# Patient Record
Sex: Female | Born: 1935 | Race: White | Hispanic: No | Marital: Married | Smoking: Never smoker
Health system: Southern US, Community
[De-identification: ages and names within clinical notes are randomized; demographics above are authoritative.]

## PROBLEM LIST (undated history)

## (undated) ENCOUNTER — Emergency Department (HOSPITAL_COMMUNITY): Payer: Medicare Other

## (undated) DIAGNOSIS — R1319 Other dysphagia: Secondary | ICD-10-CM

## (undated) DIAGNOSIS — R0602 Shortness of breath: Secondary | ICD-10-CM

## (undated) DIAGNOSIS — F419 Anxiety disorder, unspecified: Secondary | ICD-10-CM

## (undated) DIAGNOSIS — R0989 Other specified symptoms and signs involving the circulatory and respiratory systems: Secondary | ICD-10-CM

## (undated) DIAGNOSIS — R079 Chest pain, unspecified: Secondary | ICD-10-CM

## (undated) DIAGNOSIS — G4733 Obstructive sleep apnea (adult) (pediatric): Secondary | ICD-10-CM

## (undated) DIAGNOSIS — E785 Hyperlipidemia, unspecified: Secondary | ICD-10-CM

## (undated) DIAGNOSIS — R3989 Other symptoms and signs involving the genitourinary system: Secondary | ICD-10-CM

## (undated) DIAGNOSIS — Z889 Allergy status to unspecified drugs, medicaments and biological substances status: Secondary | ICD-10-CM

## (undated) DIAGNOSIS — Q794 Prune belly syndrome: Secondary | ICD-10-CM

## (undated) DIAGNOSIS — M791 Myalgia, unspecified site: Secondary | ICD-10-CM

## (undated) DIAGNOSIS — E538 Deficiency of other specified B group vitamins: Secondary | ICD-10-CM

## (undated) DIAGNOSIS — I38 Endocarditis, valve unspecified: Secondary | ICD-10-CM

## (undated) DIAGNOSIS — R42 Dizziness and giddiness: Secondary | ICD-10-CM

## (undated) DIAGNOSIS — E559 Vitamin D deficiency, unspecified: Secondary | ICD-10-CM

## (undated) DIAGNOSIS — N926 Irregular menstruation, unspecified: Secondary | ICD-10-CM

## (undated) DIAGNOSIS — K449 Diaphragmatic hernia without obstruction or gangrene: Secondary | ICD-10-CM

## (undated) DIAGNOSIS — I71012 Dissection of descending thoracic aorta: Secondary | ICD-10-CM

## (undated) DIAGNOSIS — R0600 Dyspnea, unspecified: Secondary | ICD-10-CM

## (undated) DIAGNOSIS — IMO0001 Reserved for inherently not codable concepts without codable children: Secondary | ICD-10-CM

## (undated) DIAGNOSIS — I10 Essential (primary) hypertension: Secondary | ICD-10-CM

## (undated) DIAGNOSIS — E039 Hypothyroidism, unspecified: Secondary | ICD-10-CM

## (undated) DIAGNOSIS — K5790 Diverticulosis of intestine, part unspecified, without perforation or abscess without bleeding: Secondary | ICD-10-CM

## (undated) DIAGNOSIS — R5383 Other fatigue: Secondary | ICD-10-CM

## (undated) DIAGNOSIS — R809 Proteinuria, unspecified: Secondary | ICD-10-CM

## (undated) DIAGNOSIS — M1990 Unspecified osteoarthritis, unspecified site: Secondary | ICD-10-CM

## (undated) DIAGNOSIS — R51 Headache: Secondary | ICD-10-CM

## (undated) DIAGNOSIS — K219 Gastro-esophageal reflux disease without esophagitis: Secondary | ICD-10-CM

## (undated) DIAGNOSIS — G43909 Migraine, unspecified, not intractable, without status migrainosus: Secondary | ICD-10-CM

## (undated) DIAGNOSIS — K8689 Other specified diseases of pancreas: Secondary | ICD-10-CM

## (undated) DIAGNOSIS — I219 Acute myocardial infarction, unspecified: Secondary | ICD-10-CM

## (undated) DIAGNOSIS — M79669 Pain in unspecified lower leg: Secondary | ICD-10-CM

## (undated) DIAGNOSIS — E079 Disorder of thyroid, unspecified: Secondary | ICD-10-CM

## (undated) HISTORY — PX: CYST REMOVAL NECK: SHX6281

## (undated) HISTORY — DX: Pain in unspecified lower leg: M79.669

## (undated) HISTORY — DX: Anxiety disorder, unspecified: F41.9

## (undated) HISTORY — DX: Obstructive sleep apnea (adult) (pediatric): G47.33

## (undated) HISTORY — DX: Essential (primary) hypertension: I10

## (undated) HISTORY — DX: Hyperlipidemia, unspecified: E78.5

## (undated) HISTORY — DX: Dizziness and giddiness: R42

## (undated) HISTORY — DX: Endocarditis, valve unspecified: I38

## (undated) HISTORY — DX: Other dysphagia: R13.19

## (undated) HISTORY — DX: Myalgia, unspecified site: M79.10

## (undated) HISTORY — DX: Migraine, unspecified, not intractable, without status migrainosus: G43.909

## (undated) HISTORY — PX: TONSILLECTOMY: SUR1361

## (undated) HISTORY — DX: Other symptoms and signs involving the genitourinary system: R39.89

## (undated) HISTORY — DX: Acute myocardial infarction, unspecified: I21.9

## (undated) HISTORY — DX: Gastro-esophageal reflux disease without esophagitis: K21.9

## (undated) HISTORY — DX: Shortness of breath: R06.02

## (undated) HISTORY — DX: Disorder of thyroid, unspecified: E07.9

## (undated) HISTORY — DX: Unspecified osteoarthritis, unspecified site: M19.90

## (undated) HISTORY — DX: Diaphragmatic hernia without obstruction or gangrene: K44.9

## (undated) HISTORY — DX: Other specified diseases of pancreas: K86.89

## (undated) HISTORY — DX: Diverticulosis of intestine, part unspecified, without perforation or abscess without bleeding: K57.90

## (undated) HISTORY — DX: Dyspnea, unspecified: R06.00

## (undated) HISTORY — DX: Proteinuria, unspecified: R80.9

## (undated) HISTORY — DX: Hypothyroidism, unspecified: E03.9

## (undated) HISTORY — DX: Irregular menstruation, unspecified: N92.6

## (undated) HISTORY — DX: Vitamin D deficiency, unspecified: E55.9

## (undated) HISTORY — DX: Other specified symptoms and signs involving the circulatory and respiratory systems: R09.89

## (undated) HISTORY — DX: Deficiency of other specified B group vitamins: E53.8

## (undated) HISTORY — DX: Chest pain, unspecified: R07.9

## (undated) HISTORY — DX: Prune belly syndrome: Q79.4

## (undated) HISTORY — DX: Reserved for inherently not codable concepts without codable children: IMO0001

## (undated) HISTORY — DX: Dissection of descending thoracic aorta: I71.012

## (undated) HISTORY — DX: Other fatigue: R53.83

## (undated) HISTORY — DX: Allergy status to unspecified drugs, medicaments and biological substances: Z88.9

## (undated) HISTORY — DX: Headache: R51

---

## 1972-02-21 HISTORY — PX: CYSTECTOMY: SUR359

## 1974-02-20 HISTORY — PX: ABDOMINAL HYSTERECTOMY: SHX81

## 1994-02-20 HISTORY — PX: CYSTECTOMY: SUR359

## 1998-02-20 HISTORY — PX: CHOLECYSTECTOMY: SHX55

## 1998-06-07 ENCOUNTER — Emergency Department (HOSPITAL_COMMUNITY): Admission: EM | Admit: 1998-06-07 | Discharge: 1998-06-07 | Payer: Self-pay | Admitting: Emergency Medicine

## 1998-06-07 ENCOUNTER — Encounter: Payer: Self-pay | Admitting: Emergency Medicine

## 1998-06-30 ENCOUNTER — Ambulatory Visit (HOSPITAL_COMMUNITY): Admission: RE | Admit: 1998-06-30 | Discharge: 1998-06-30 | Payer: Self-pay | Admitting: Neurology

## 1999-06-09 ENCOUNTER — Encounter: Admission: RE | Admit: 1999-06-09 | Discharge: 1999-06-09 | Payer: Self-pay | Admitting: Endocrinology

## 1999-06-09 ENCOUNTER — Encounter: Payer: Self-pay | Admitting: Endocrinology

## 1999-06-23 ENCOUNTER — Ambulatory Visit (HOSPITAL_COMMUNITY): Admission: RE | Admit: 1999-06-23 | Discharge: 1999-06-23 | Payer: Self-pay | Admitting: Neurology

## 1999-06-23 ENCOUNTER — Encounter: Payer: Self-pay | Admitting: Neurology

## 2000-06-11 ENCOUNTER — Encounter: Admission: RE | Admit: 2000-06-11 | Discharge: 2000-06-11 | Payer: Self-pay | Admitting: Endocrinology

## 2000-06-11 ENCOUNTER — Encounter: Payer: Self-pay | Admitting: Endocrinology

## 2000-11-19 ENCOUNTER — Encounter: Payer: Self-pay | Admitting: Endocrinology

## 2000-11-19 ENCOUNTER — Encounter: Admission: RE | Admit: 2000-11-19 | Discharge: 2000-11-19 | Payer: Self-pay | Admitting: Endocrinology

## 2001-02-20 HISTORY — PX: EYE SURGERY: SHX253

## 2001-05-13 ENCOUNTER — Other Ambulatory Visit: Admission: RE | Admit: 2001-05-13 | Discharge: 2001-05-13 | Payer: Self-pay | Admitting: Obstetrics & Gynecology

## 2001-06-18 ENCOUNTER — Encounter: Admission: RE | Admit: 2001-06-18 | Discharge: 2001-06-18 | Payer: Self-pay | Admitting: Endocrinology

## 2001-06-18 ENCOUNTER — Encounter: Payer: Self-pay | Admitting: Endocrinology

## 2002-01-10 ENCOUNTER — Encounter: Payer: Self-pay | Admitting: Urology

## 2002-01-10 ENCOUNTER — Encounter: Admission: RE | Admit: 2002-01-10 | Discharge: 2002-01-10 | Payer: Self-pay | Admitting: Urology

## 2002-04-25 ENCOUNTER — Encounter: Payer: Self-pay | Admitting: Endocrinology

## 2002-04-25 ENCOUNTER — Encounter: Admission: RE | Admit: 2002-04-25 | Discharge: 2002-04-25 | Payer: Self-pay | Admitting: Endocrinology

## 2002-06-20 ENCOUNTER — Encounter: Admission: RE | Admit: 2002-06-20 | Discharge: 2002-06-20 | Payer: Self-pay | Admitting: Endocrinology

## 2002-06-20 ENCOUNTER — Encounter: Payer: Self-pay | Admitting: Endocrinology

## 2002-08-27 ENCOUNTER — Other Ambulatory Visit: Admission: RE | Admit: 2002-08-27 | Discharge: 2002-08-27 | Payer: Self-pay | Admitting: Obstetrics & Gynecology

## 2002-10-15 ENCOUNTER — Inpatient Hospital Stay (HOSPITAL_COMMUNITY): Admission: AD | Admit: 2002-10-15 | Discharge: 2002-10-17 | Payer: Self-pay | Admitting: Obstetrics & Gynecology

## 2003-05-25 ENCOUNTER — Ambulatory Visit (HOSPITAL_COMMUNITY): Admission: RE | Admit: 2003-05-25 | Discharge: 2003-05-25 | Payer: Self-pay | Admitting: Endocrinology

## 2004-11-25 ENCOUNTER — Encounter: Admission: RE | Admit: 2004-11-25 | Discharge: 2004-11-25 | Payer: Self-pay | Admitting: Endocrinology

## 2005-11-27 ENCOUNTER — Encounter: Admission: RE | Admit: 2005-11-27 | Discharge: 2005-11-27 | Payer: Self-pay | Admitting: Endocrinology

## 2006-11-30 ENCOUNTER — Encounter: Admission: RE | Admit: 2006-11-30 | Discharge: 2006-11-30 | Payer: Self-pay | Admitting: Endocrinology

## 2007-12-27 ENCOUNTER — Encounter: Admission: RE | Admit: 2007-12-27 | Discharge: 2007-12-27 | Payer: Self-pay

## 2008-12-31 ENCOUNTER — Encounter: Admission: RE | Admit: 2008-12-31 | Discharge: 2008-12-31 | Payer: Self-pay | Admitting: Medical Oncology

## 2009-02-20 DIAGNOSIS — R079 Chest pain, unspecified: Secondary | ICD-10-CM

## 2009-02-20 HISTORY — DX: Chest pain, unspecified: R07.9

## 2009-11-19 ENCOUNTER — Ambulatory Visit: Payer: Self-pay | Admitting: Cardiovascular Disease

## 2009-12-02 ENCOUNTER — Ambulatory Visit: Payer: Self-pay | Admitting: Cardiovascular Disease

## 2010-07-05 NOTE — Letter (Signed)
November 19, 2009    [facility]  [address]  [place], [place] [address]   RE:  Leah Pittman, Leah Pittman  MRN:  [id number]  /  DOB:  [birth year]   Dear Dr. Orvan Falconer,   I had the pleasure of seeing Ms. Leah Pittman for cardiac evaluation.  The patient had scheduled an appointment to see me regarding recent  chest pain, fatigue and issues with her blood pressure.  She has the  following problem list:  1. Hypertension.  2. Hypothyroidism.  3. Anxiety.  4. Glaucoma.  5. Myalgia.  6. Migraine headache.   The patient is here today with multiple complaints.  She has been having  significant problems with headache as well as discomfort in the back of  her head and neck.  She is following up with Neurology in that regard.  She also has been having vision issues with glaucoma.  She has been  complaining of generalized fatigue and having no energy.  She also has  been having some left-sided chest discomfort which is localized in 1  area.  It happens at rest, and according to the patient, it gets  slightly worse with activities.  She has chronic exertional dyspnea.  She has no previous cardiac history.  It seems that she has been  frustrated with her overall medical conditions.  According to the  patient, her blood pressure is very labile and recently was found to be  very elevated.  She has been on [place] and then Bystolic was added.  Her  blood pressure dropped to a systolic of 90s when she started taking  Bystolic, and thus, she stopped taking the medication.  She has not  taken it in more than a week.   PAST MEDICAL HISTORY:  As outlined above.   MEDICATIONS:  1. Bystolic 5 mg once daily.  2. Depakote 250 mg at bedtime.  This is an extended release.  3. Vitamin D 50,000 international units once a week.  4. Eye drops.  5. Synthroid 75 mcg once daily.  6. Edarbi 80 mg once daily.  7. Probiotics once daily.   ALLERGIES:  Codeine and Phenergan.   SOCIAL  HISTORY:  Negative for smoking, alcohol or drug use.  The patient  is married and is retired.   FAMILY HISTORY:  Negative for premature coronary artery disease.   REVIEW OF SYSTEMS:  Remarkable for allergies, generalized fatigue,  constipation, chest pain, dyspnea, arthritis pain, anxiety, and  headache.  Full review of system was performed and is otherwise  negative.   PHYSICAL EXAMINATION:  GENERAL:  The patient seems to be anxious.  She  appears to be in no acute distress.  VITAL SIGNS:  Weight is 131.6 pounds, blood pressure is 134/68, pulse is  65, oxygen saturation is 98% on room air.  HEENT:  Normocephalic, atraumatic.  NECK:  No JVD or carotid bruits.  RESPIRATORY:  Normal respiratory effort with no use of accessory  muscles.  Auscultation reveals normal breath sounds.  CARDIOVASCULAR:  Normal PMI.  Normal S1-S2 with no gallops or murmurs.  ABDOMEN:  Benign, nontender, nondistended.  EXTREMITIES:  With no clubbing, cyanosis or edema.  Skin is warm and dry  with no rash.  PSYCHIATRIC:  She is alert, oriented x3.  She appears to be anxious.  MUSCULOSKELETAL:  There is normal muscle strength in the upper and lower  extremities.   STUDIES:  An electrocardiogram was performed which showed sinus rhythm  with nonspecific  T-wave changes.   IMPRESSION:  1. Atypical chest pain.  Unfortunately, the patient has multiple      complaints and seems to have generalized pain manifestations.  She      does have chest pain which is difficult to determine if it is      cardiac or noncardiac.  From the description, it sounds to be      musculoskeletal.  Due to her age and risk factors, I recommend      evaluation with a stress test for further evaluation.  I am afraid      she will not be able to get her heart rate up enough due to her      generalized fatigue, and thus, I suggested a pharmacological stress      test.  However, she is very worried about side effects due to her      previous  experience with adenosine.  Will attempt treadmill type,      but it might have to be switched if she is not able to achieve the      desired maximal heart rate.  2. Hypertension which seems to be labile.  She has not been taking      Bystolic for a week.  I asked her to the record her blood pressure      at least three times a week. Will review when she comes back for      follow-up.  Will decide at that time if resuming Bystolic will be      needed.  I explained to her that her blood pressure can fluctuate      depending on her overall general status, degree of discomfort and      stress that she is having from her headache.  3. Borderline hyperlipidemia.  Her recent lipid profile showed a total      cholesterol of 223, HDL 59, triglyceride 204 and LDL of 123.  At      this degree, I recommend lifestyle changes before trying any      medications.  The patient will follow-up after her stress test and      we will reevaluate her blood pressure at that time.    Sincerely,      Lorine Bears, MD  Electronically Signed    MA/MedQ  DD: 11/19/2009  DT: 11/19/2009  Job #: [id number]

## 2010-07-05 NOTE — Assessment & Plan Note (Signed)
[facility]                        [place] CARDIOLOGY OFFICE NOTE   NAME:Beevers, CARRERA KIESEL                      MRN:          [id number]  DATE:12/02/2009                            DOB:          11/26/1930    Ms. Leah Pittman is a 77 year old female who is here today for a followup  visit.  She has the following problem list:  1. Labile hypertension.  2. Hypothyroidism.  3. Anxiety.  4. Glaucoma.  5. Myalgia.  6. Migraine headache.   I saw Ms. Shidler recently for evaluation of labile hypertension as  well as atypical chest pain.  She was having significant issues with her  glaucoma as well and was having severe headache.  Since that time, she  was started on Depakote and there has been significant improvement in  her symptoms.  She does not report any more chest pain except for aching  on the left side which is tender to touch.  She has no exertional chest  tightness.  She underwent a stress test and overall did well.  She did  not have any chest pain during the stress test.  The patient has not  been taking any blood pressure medications and has been monitoring her  blood pressure at home.  She has not had any systolic blood pressure  reading above 140, in spite of being off Bystolic and Edarbi.  It seems  that her blood pressure goes up significantly when she is having a  headache or if she is under stress.   PHYSICAL EXAMINATION:  VITAL SIGNS:  Weight is 134 pounds, blood  pressure is 139/75, pulse is 76, oxygen saturation is 97% on room air.  NECK:  No JVD or carotid bruits.  LUNGS:  Clear to auscultation.  HEART:  Regular rate and rhythm with no gallops or murmurs.  ABDOMEN:  Benign, nontender, nondistended.  EXTREMITIES:  With no clubbing, cyanosis, or edema.   IMPRESSION:  1. Atypical chest pain which seems to be musculoskeletal in nature.      We did a nuclear stress test due to her risk factors.  Overall, she      had no chest pain and  no EKG changes with exercise.  She was able      to exercise for 3 minutes and 30 seconds and achieved an 85% of      maximal predicted heart rate.  Overall, stress test was normal with      no evidence of ischemia or infarct.  The patient was reassured      about the results.  2. Labile hypertension.  This seems to be related to anxiety and      stress.  She has been off her 2 blood pressure medications.  The      readings have been overall in a good range except for 1 or 2      readings.  At this time, I recommend that she continues to record      her blood pressure readings.  If her systolic pressure continues to      go above 140 and it might  be reasonable to resume Bystolic 5 mg      once daily alone.  A beta-blocker might be helpful in her situation      given that most of her hypertension seems to be driven by anxiety      and likely      hyperadrenergic state.  No further cardiac follow up is needed.      She can follow up on an as-needed basis.     Lorine Bears, MD  Electronically Signed    MA/MedQ  DD: 12/02/2009  DT: 12/03/2009  Job #: [id number]

## 2010-07-08 NOTE — Op Note (Signed)
NAME:  Leah Pittman, Leah Pittman                         ACCOUNT NO.:  [id number]   MEDICAL RECORD NO.:  [id number]                   PATIENT TYPE:  INP   LOCATION:  9108                                 FACILITY:  [facility]   PHYSICIAN:  Genia Del, M.D.             DATE OF BIRTH:  07/17/1930   DATE OF PROCEDURE:  10/15/2002  DATE OF DISCHARGE:                                 OPERATIVE REPORT   PREOPERATIVE DIAGNOSIS:  Symptomatic cystocele, grade 3/4 and rectocele,  grade 1/3.   POSTOPERATIVE DIAGNOSIS:  Symptomatic cystocele, grade 3/4 and rectocele,  grade 1/3.   INTERVENTION:  Repair of cystorectocele.   SURGEON:  Genia Del, M.D.   ASSISTANT:  Gerri Spore B. Earlene Plater, M.D.   ANESTHESIOLOGIST:  Belva Agee, M.D.   PROCEDURE:  Under general anesthesia with endotracheal intubation, the  patient was in the lithotomy position.  She was prepped with Hibiclens on  the suprapubic, vulvar and vaginal areas and draped as usual.  The bladder  catheter was inserted.  The vaginal exam reveals a grade 3/4 cystocele and a  grade 1/3 rectocele.  No enterocele present.  Minimal colpocele.  We started  with the cystocele repair.  Allis were put in place just before the vaginal  vault on the vaginal mucosa.  We opened the vaginal mucosa transversely at  that level with a scalpel.  We then started undermining with the scissors  and then cutting longitudinally along the midline.  We placed Allis as we go  along and we stop at about 3 cm from the urethra. We then used the scalpel  and the scissors to relieve the fascia from the vaginal mucosa on each side.  The fascia is pushed down with a sponge.  Once the cystocele is reduced that  way, we make a circumferential 2-0 Vicryl in the middle of the cystocele.  We then use interrupted sutures of 2-0 Vicryl to strengthen the fascia from  side to side.  We then cut the excess mucosa on each side and close the  vaginal mucosa with interrupted sutures of  2-0 Vicryl.  Hemostasis was  controlled when necessary with the electrocautery.  The first suture put in  was a Kelly stitch to support the urethra with a 0 Vicryl on the urologic  needle.   We then proceeded with the rectocele repair.  We opened the perineum at the  fourchette with the scalpel between two Allis.  We then undermined the  mucosa with the scissors and cut longitudinally along the posterior vaginal  mucosa in the midline.  We reached a point where the rectocele ends at about  2/3 of the vagina posteriorly.  We then used the scalpel and the scissors to  release the fascia and pressed down with a gauze to reduce the rectocele.  We then reapproximated the fascia from side to side with interrupted sutures  of 2-0 Vicryl.  The excess  mucosa is then cut with straight scissors on each  side and interrupted sutures of 2-0 Vicryl are used to close the vaginal  mucosa posteriorly.  We reinforced the levator ani with 2-0 Vicryl at the  perineum and closed the skin with 3-0 Vicryl with interrupted sutures.  Hemostasis was adequate.  The counts of sponges and instruments were  complete x 2.  The estimated blood loss was less than 50 mL.  There was no  complications and the patient was brought to the recovery room in good  status.                                              Genia Del, M.D.   ML/MEDQ  D:  10/15/2002  T:  10/15/2002  Job:  [id number]

## 2010-07-08 NOTE — H&P (Signed)
NAME:  Leah Pittman, Leah Pittman                         ACCOUNT NO.:  [id number]   MEDICAL RECORD NO.:  [id number]                   PATIENT TYPE:  INP   LOCATION:  NA                                   FACILITY:  [facility]   PHYSICIAN:  Genia Del, M.D.             DATE OF BIRTH:  07-19-1930   DATE OF ADMISSION:  10/15/2002  DATE OF DISCHARGE:                                HISTORY & PHYSICAL   IDENTIFYING DATA:  Leah Pittman is a 77 year old, G3, P3.   REASON FOR ADMISSION:  Symptomatic cystorectocele.   HISTORY OF PRESENT ILLNESS:  The patient has had progressive symptoms of  vulvar pressure and mass, especially when standing and currently most of the  time.  She occasionally has right lower abdominal pain associated with that.  She denies any urinary tract infection symptoms and she denies any stress  urinary incontinence or urgency.  No problems with her bowel movements.  No  vaginal bleeding.  She is status post total abdominal hysterectomy with  preservation of adnexa.   The patient was seen by Dr. Isabel Caprice, urologist, in May 2003.  At that time  her examination, except for the cystorectocele was within normal limits with  no stress urinary incontinence and no urgency.   PAST SURGICAL HISTORY:  1. Total abdominal hysterectomy in 2000.  2. Ovarian cystectomy about 28 years ago.  3. 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  4. Cholecystectomy in 2004.   PAST MEDICAL HISTORY:  1. Chronic hypertension.  2. Hypothyroidism.  3. Gastric ulcers.  4. Fibrocystic breast disease.   MEDICATIONS:  1. Synthroid 0.1 mg daily.  2. Flexeril one tablet at bedtime.  3. Azopt t.i.d.  4. Lumigan at bedtime.   ALLERGIES:  CODEINE and PHENERGAN.   PAST OBSTETRICAL HISTORY:  Spontaneous vaginal delivery x3.   FAMILY HISTORY:  Positive for cardiovascular disease; father had a MI at 25.  Sister with brain tumor.   REVIEW OF SYSTEMS:  CONSTITUTIONAL:  Negative.  HEENT: Negative.  CARDIOVASCULAR/RESPIRATORY:  Negative.  GASTROINTESTINAL:  History of  gastric ulcers.  Occasional heartburn.  No hematemesis.  GENITOURINARY:  See  history of present illness.  Very occasional mild leakage of urine with  major effort.  MUSCULOSKELETAL:  Negative.  DERMATOLOGY:  Negative.  NEUROLOGICAL:  Negative.  PSYCHIATRIC:  Negative.  ENDOCRINE:  Thyroid  disease; hypothyroidism on Synthroid.  HEMATOPOIETIC/LYMPHATICS:  Negative.   PHYSICAL EXAMINATION:  GENERAL:  No apparent distress.  VITAL SIGNS:  Blood pressure 118/80, pulse 80 regular, temperature normal,  respiratory rate regular 20, weight 141.5 pounds, height 5 feet 3.5 inches.  LUNGS:  Clear bilaterally.  HEART:  Regular cardiac rhythm.  No murmur.  ABDOMEN:  Soft, nontender.  No hepatosplenomegaly.  No mass.  LYMPHATICS:  No cervical, axillary or groin lymph nodes felt.  SKIN:  Within normal limits.  BREASTS:  Normal bilaterally.  No mass felt.  BACK:  No CVA tenderness.  GYNECOLOGIC:  Minimal colpocele, less than grade 1/3.  Cystocele grade 3/4.  Rectocele grade 1/3.  No enterocele.  Uterus is surgically absent.  No  adnexal mass.  RECTAL:  Negative.  Hemoccult x3 negative.  EXTREMITIES:  Lower limbs normal.  Good pulses.  No edema.   DIAGNOSTIC STUDIES:  Pap test done on August 27, 2002, was within normal  limits.  Last mammogram, August 2004, within normal limits.  Last bone  density April 2003, and was normal.   IMPRESSION:  1. Symptomatic cystocele, grade 3/4, with rectocele, grade 1/3 and minimal     colpocele.  2. Minimal-to-no stress urinary incontinence, no urgency.  3. Normal bowel movements.  4. Medical history positive for chronic hypertension, currently stable     without medication.  5. Hypothyroidism on Synthroid.   PLAN:  1. Admit to [facility] for surgery.  2. We will proceed with repair of cystocele and rectocele with Kelly stitch.     The patient declined sling procedure.   The surgery and  risks were reviewed including 10% risk of subsequent stress  urinary incontinence, risk of trauma, hemorrhage, infection, DVT, pulmonary  embolism, and anesthesia reviewed.  The patient voiced understanding and  agreement.  Will give antibiotics IV at induction and patient will have  inflatable devices to minimize the risks of DVT.                                               Genia Del, M.D.    ML/MEDQ  D:  10/14/2002  T:  10/15/2002  Job:  [id number]

## 2010-07-08 NOTE — Discharge Summary (Signed)
   Leah Pittman, Leah Pittman                         ACCOUNT NO.:  [id number]   MEDICAL RECORD NO.:  [id number]                   PATIENT TYPE:  INP   LOCATION:  9108                                 FACILITY:  [facility]   PHYSICIAN:  Genia Del, M.D.             DATE OF BIRTH:  18-Jul-1930   DATE OF ADMISSION:  10/15/2002  DATE OF DISCHARGE:  10/17/2002                                 DISCHARGE SUMMARY   ADMISSION DIAGNOSES:  Symptomatic cystocele grade 3/4 and rectocele grade  1/3.   DISCHARGE DIAGNOSES:  1. Symptomatic cystocele grade 3/4 and rectocele grade 1/3.  2. Possible gastroesophageal reflux.   INTERVENTION:  Repair of cystorectocele on October 15, 2002.   HOSPITAL COURSE:  Leah Pittman is a 77 year old G3, P3, who was admitted  for repair of a symptomatic cystorectocele, the rectocele was grade 3/4 and  the rectocele grade 1/3.  She had an uneventful repair of cystorectocele  with Kelly-Kennedy stitch for stabilization of her urethra, the estimated  blood was less than 50 mL's and no complication occurred.  Postoperative  evolution was normal except for a lot of nausea, and some vomiting, probably  associated with gastroesophageal reflux.  Otherwise she remained  hemodynamically stable and afebrile.  Her hemoglobin postoperatively was  11.1, white blood cells 7.9.  She was weaned from the bladder catheter  easily on postoperative day #1.  She was discharged to home on postoperative  day #2 on October 17, 2002 in good status, Tylox was prescribed p.r.n. and  she will take Tylenol p.r.n.  Zantac 150 b.i.d. was advised, and she will  have gastroenterology referral for possible gastroesophageal reflux.  Postoperative advice were given.  She will follow up with me at [facility] in three weeks.                                                Genia Del, M.D.    ML/MEDQ  D:  10/30/2002  T:  10/30/2002  Job:  [id number]

## 2010-11-13 ENCOUNTER — Encounter: Payer: Self-pay | Admitting: Cardiovascular Disease

## 2010-12-21 ENCOUNTER — Encounter: Payer: Self-pay | Admitting: Cardiovascular Disease

## 2011-02-21 HISTORY — PX: MOUTH SURGERY: SHX715

## 2011-10-30 ENCOUNTER — Ambulatory Visit
Admission: RE | Admit: 2011-10-30 | Discharge: 2011-10-30 | Disposition: A | Discharge status: Home / Self Care | Payer: Medicare Other | Source: Ambulatory Visit | Attending: Internal Medicine | Admitting: Internal Medicine

## 2011-10-30 ENCOUNTER — Other Ambulatory Visit: Payer: Self-pay | Admitting: Internal Medicine

## 2011-10-30 DIAGNOSIS — N63 Unspecified lump in unspecified breast: Secondary | ICD-10-CM

## 2011-10-30 DIAGNOSIS — Z1231 Encounter for screening mammogram for malignant neoplasm of breast: Secondary | ICD-10-CM

## 2011-10-30 DIAGNOSIS — N6489 Other specified disorders of breast: Secondary | ICD-10-CM

## 2011-11-14 ENCOUNTER — Other Ambulatory Visit: Payer: Medicare Other

## 2011-11-22 ENCOUNTER — Ambulatory Visit
Admission: RE | Admit: 2011-11-22 | Discharge: 2011-11-22 | Disposition: A | Discharge status: Home / Self Care | Payer: Medicare Other | Source: Ambulatory Visit | Attending: Internal Medicine | Admitting: Internal Medicine

## 2011-11-22 DIAGNOSIS — N63 Unspecified lump in unspecified breast: Secondary | ICD-10-CM

## 2011-11-22 DIAGNOSIS — N6489 Other specified disorders of breast: Secondary | ICD-10-CM

## 2012-02-01 ENCOUNTER — Other Ambulatory Visit (HOSPITAL_COMMUNITY): Payer: Self-pay | Admitting: Cardiovascular Disease

## 2012-02-01 DIAGNOSIS — I10 Essential (primary) hypertension: Secondary | ICD-10-CM

## 2012-02-01 DIAGNOSIS — R0600 Dyspnea, unspecified: Secondary | ICD-10-CM

## 2012-02-07 ENCOUNTER — Encounter (HOSPITAL_COMMUNITY): Payer: Medicare Other

## 2012-02-16 ENCOUNTER — Ambulatory Visit (HOSPITAL_COMMUNITY)
Admission: RE | Admit: 2012-02-16 | Discharge: 2012-02-16 | Disposition: A | Discharge status: Home / Self Care | Payer: Medicare Other | Source: Ambulatory Visit | Attending: Cardiovascular Disease | Admitting: Cardiovascular Disease

## 2012-02-16 DIAGNOSIS — R0989 Other specified symptoms and signs involving the circulatory and respiratory systems: Secondary | ICD-10-CM | POA: Insufficient documentation

## 2012-02-16 DIAGNOSIS — I10 Essential (primary) hypertension: Secondary | ICD-10-CM

## 2012-02-16 DIAGNOSIS — R0600 Dyspnea, unspecified: Secondary | ICD-10-CM

## 2012-02-16 DIAGNOSIS — R0609 Other forms of dyspnea: Secondary | ICD-10-CM | POA: Insufficient documentation

## 2012-02-16 HISTORY — DX: Dyspnea, unspecified: R06.00

## 2012-02-16 HISTORY — DX: Essential (primary) hypertension: I10

## 2012-02-16 NOTE — Progress Notes (Signed)
Leah Pittman   2D echo completed 02/16/2012.   Veda Canning, RDCS

## 2012-02-16 NOTE — Progress Notes (Signed)
Renal artery duplex completed 

## 2012-03-14 ENCOUNTER — Other Ambulatory Visit: Payer: Self-pay | Admitting: Neurology

## 2012-03-14 DIAGNOSIS — G43709 Chronic migraine without aura, not intractable, without status migrainosus: Secondary | ICD-10-CM

## 2012-03-14 DIAGNOSIS — H543 Unqualified visual loss, both eyes: Secondary | ICD-10-CM

## 2012-07-18 ENCOUNTER — Ambulatory Visit (INDEPENDENT_AMBULATORY_CARE_PROVIDER_SITE_OTHER): Payer: Medicare Other | Admitting: Cardiovascular Disease

## 2012-07-18 ENCOUNTER — Encounter: Payer: Self-pay | Admitting: Cardiovascular Disease

## 2012-07-18 VITALS — BP 166/78 | HR 62 | Resp 16 | Ht 64.0 in | Wt 123.9 lb

## 2012-07-18 DIAGNOSIS — R0989 Other specified symptoms and signs involving the circulatory and respiratory systems: Secondary | ICD-10-CM

## 2012-07-18 DIAGNOSIS — F411 Generalized anxiety disorder: Secondary | ICD-10-CM

## 2012-07-18 DIAGNOSIS — F419 Anxiety disorder, unspecified: Secondary | ICD-10-CM

## 2012-07-18 DIAGNOSIS — I10 Essential (primary) hypertension: Secondary | ICD-10-CM

## 2012-07-18 DIAGNOSIS — G43909 Migraine, unspecified, not intractable, without status migrainosus: Secondary | ICD-10-CM

## 2012-07-18 DIAGNOSIS — R079 Chest pain, unspecified: Secondary | ICD-10-CM

## 2012-07-18 MED ORDER — LISINOPRIL 30 MG PO TABS: 30.0000 mg | ORAL_TABLET | Freq: Every morning | ORAL | Status: DC

## 2012-07-18 NOTE — Patient Instructions (Addendum)
Your physician has recommended you make the following change in your medication: change lisinopril to 30 mg daily

## 2012-07-21 DIAGNOSIS — F419 Anxiety disorder, unspecified: Secondary | ICD-10-CM | POA: Insufficient documentation

## 2012-07-21 DIAGNOSIS — R0989 Other specified symptoms and signs involving the circulatory and respiratory systems: Secondary | ICD-10-CM | POA: Insufficient documentation

## 2012-07-21 DIAGNOSIS — G43909 Migraine, unspecified, not intractable, without status migrainosus: Secondary | ICD-10-CM | POA: Insufficient documentation

## 2012-07-21 NOTE — Progress Notes (Signed)
Patient ID: Leah Pittman, female   DOB: 10/21/1930, 79 y.o.   MRN: [id number]   Reason for office visit Posthospital followup  Ms. Leah Pittman is here following a hospitalization in [place] but unfortunately none of the records from that hospitalization are available for review. It sounds like a hospitalization was related to severely elevated blood pressure. This is associated with chest pain. During cardiac testing (it sounds like a pharmacological stress test) she developed severe chest pain. It sounds that the test was completed and that the results were normal, by the patient's report. On the other hand she had been told by another physician earlier in her auscultation that she had had heart attack. None of the data is available for review.  She continues to be very preoccupied by her blood pressure. She states that when she takes lisinopril 40 mg daily her blood pressures frequently lower she feels weak and dizzy. On the hand which he takes only 20 mg of lisinopril she often notes that her blood pressure is too high and she has sensation of fullness and discomfort in her head. She speaks about these problems of greater intensity and anxiety.  She denies any exertional chest pain or shortness of breath and chest not had orthopnea present for dyspnea or lower extremity edema. She has not had syncope although she is often lightheaded.  We have performed a variety of tests to evaluate her complaints including an echocardiogram and renal duplex ultrasound which were normal tests. Since she has headaches and eye problems we also checked an erythrocyte sedimentation rate and ANA as a screening test for temporal arteritis. Both of these were normal. In fact the ESR was only 1.  Allergies  Allergen Reactions  . Codeine   . Shellfish Allergy Nausea And Vomiting    Current Outpatient Prescriptions  Medication Sig Dispense Refill  . amitriptyline (ELAVIL) 10 MG tablet Take 10 mg by mouth as needed.       . clobetasol cream (TEMOVATE) 0.05 % Apply topically 2 (two) times daily after a meal.      . HYDROcodone-acetaminophen (NORCO/VICODIN) 5-325 MG per tablet Take 0.5 tablets by mouth as needed for pain.      . hydrOXYzine (ATARAX/VISTARIL) 25 MG tablet       . levothyroxine (SYNTHROID, LEVOTHROID) 75 MCG tablet Take 75 mcg by mouth daily.        Marland Kitchen lisinopril (PRINIVIL,ZESTRIL) 30 MG tablet Take 1 tablet (30 mg total) by mouth every morning.  30 tablet  5  . LORazepam (ATIVAN) 1 MG tablet Take 1 mg by mouth 2 (two) times daily.      . nebivolol (BYSTOLIC) 5 MG tablet Take 5 mg by mouth daily.        . pantoprazole (PROTONIX) 40 MG tablet       . Probiotic Product (SOLUBLE FIBER/PROBIOTICS PO) Take by mouth.        . Timolol Hemihydrate (BETIMOL OP) Apply to eye.        . Travoprost, BAK Free, (TRAVATAMN) 0.004 % SOLN ophthalmic solution 1 drop at bedtime.        . ALPRAZOLAM PO Take by mouth.       . divalproex (DEPAKOTE) 250 MG EC tablet Take 250 mg by mouth at bedtime.        . dorzolamide-timolol (COSOPT) 22.3-6.8 MG/ML ophthalmic solution       . ergocalciferol (VITAMIN D2) 50000 UNITS capsule Take 50,000 Units by mouth once a week.        Marland Kitchen  OXYCODONE HCL PO Take by mouth.        . promethazine (PHENERGAN) 25 MG tablet Take 12.5 mg by mouth as needed for nausea.      Jeananne Rama Sulfate (EYE DROPS A/C OP) Apply to eye.         No current facility-administered medications for this visit.    Past Medical History  Diagnosis Date  . Headache(784.0)   . Lightheadedness   . SOB (shortness of breath)   . Pain, lower leg     Calf  . Multiple allergies   . Fatigue   . Hiatal hernia   . Reflux   . Problem of menstruation   . Urinary problem   . Arthritis   . Thyroid disease   . Anxiety   . Hypothyroidism   . Anxiety   . Glaucoma   . Myalgia   . Migraine headache   . High blood pressure 02/16/2012    RENAL DOPPLER - normal  . Labile hypertension   . Chest pain      CARDIOLITE - no significant symptoms, EKG changes, or arrhythmias  . Dyspnea 02/16/2012    2D ECHO - EF 55-60%, normal    Past Surgical History  Procedure Laterality Date  . Abdominal hysterectomy  1976  . Cystectomy  1974    Intestine  . 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 Cholecystectomy  2000  . Eye surgery  2003    Family History  Problem Relation Age of Onset  . Heart attack Father   . Stroke Father   . Cancer Sister   . Cancer Brother   . Asthma Brother   . Cancer Brother     History   Social History  . Marital Status: Married    Spouse Name: N/A    Number of Children: N/A  . Years of Education: N/A   Occupational History  . Retired    Social History Main Topics  . Smoking status: Never Smoker   . Smokeless tobacco: Not on file  . Alcohol Use: No  . Drug Use: No  . Sexually Active: Not on file   Other Topics Concern  . Not on file   Social History Narrative  . No narrative on file    Review of systems: She complains of lightheadedness episodic shortness of breath at rest and resolves spontaneously it is not associated with other complaints, episodic chest pain associated with elevations in blood pressure, but denies palpitations syncope focal neurological deficits and intermittent claudication. Frequent "trigeminal migraines". She is often troubled by indigestion.  PHYSICAL EXAM BP 166/78  Pulse 62  Resp 16  Ht 5\' 4"  (1.626 m)  Wt 123 lb 14.4 oz (56.201 kg)  BMI 21.26 kg/m2  General: Alert, oriented x3, no distress. She looks considerably younger than her stated age Head: no evidence of trauma, PERRL, EOMI, no exophtalmos or lid lag, no myxedema, no xanthelasma; normal ears, nose and oropharynx Neck: normal jugular venous pulsations and no hepatojugular reflux; brisk carotid pulses without delay and no carotid bruits Chest: clear to auscultation, no signs of consolidation by percussion or palpation, normal fremitus, symmetrical and full respiratory  excursions Cardiovascular: normal position and quality of the apical impulse, regular rhythm, normal first and second heart sounds, no murmurs, rubs or gallops Abdomen: no tenderness or distention, no masses by palpation, no abnormal pulsatility or arterial bruits, normal bowel sounds, no hepatosplenomegaly Extremities: no clubbing, cyanosis or edema; 2+ radial, ulnar and brachial  pulses bilaterally; 2+ right femoral, posterior tibial and dorsalis pedis pulses; 2+ left femoral, posterior tibial and dorsalis pedis pulses; no subclavian or femoral bruits Neurological: grossly nonfocal   EKG: Normal sinus rhythm, normal ECG    Lipid Panel  No results found for this basename: chol, trig, hdl, cholhdl, vldl, ldlcalc    BMET No results found for this basename: na, k, cl, co2, glucose, bun, creatinine, calcium, gfrnonaa, gfraa     ASSESSMENT AND PLAN  It is hard to make any judgments about the recent events in [place] without the necessary data. She seems to be, so the oscillating between the 20 mg 40 mg doses of lisinopril. I am not sure that the changes on making her not just cosmetic but I have given her a brand-new prescription for lisinopril 30 mg once daily.  I think the broad oscillations in her blood pressure are related to states of irritation and anxiety related to her headaches and overall concern about her health problems.  She will followup in 3 weeks. Hopefully at that time we will have TEE results of her workup in [place].  Junious Silk, MD, [facility] [phone] office [phone] pager

## 2012-09-26 ENCOUNTER — Encounter: Payer: Self-pay | Admitting: Cardiovascular Disease

## 2012-09-26 ENCOUNTER — Ambulatory Visit (INDEPENDENT_AMBULATORY_CARE_PROVIDER_SITE_OTHER): Payer: Medicare Other | Admitting: Cardiovascular Disease

## 2012-09-26 VITALS — BP 152/68 | HR 59 | Ht 64.0 in | Wt 123.4 lb

## 2012-09-26 DIAGNOSIS — R0989 Other specified symptoms and signs involving the circulatory and respiratory systems: Secondary | ICD-10-CM

## 2012-09-26 DIAGNOSIS — I10 Essential (primary) hypertension: Secondary | ICD-10-CM

## 2012-09-26 DIAGNOSIS — H409 Unspecified glaucoma: Secondary | ICD-10-CM

## 2012-09-26 NOTE — Patient Instructions (Addendum)
Your physician recommends that you schedule a follow-up appointment in: 6 months  

## 2012-09-28 NOTE — Progress Notes (Signed)
Patient ID: Leah Pittman, female   DOB: 12/15/1930, 79 y.o.   MRN: [id number]     Reason for office visit HTN  The patient is a 79 year old woman with complaints of difficult-to-control hypertension and severe headaches that have been going on for many years.  A few days ago she was evaluated in the emergency room. She had gone to have ocular surgery and was found to be severely hypertensive (systolic blood pressure over 200 mm Hg). She was sent to the emergency room and observed for a lengthy period of time until her blood pressure improved. She was then discharged. I don't think her treatment was in any way changed.  She recently had an echocardiogram and duplex renal artery ultrasonography. Both of these studies are essentially normal. There is no evidence whatsoever of renal artery stenosis. She has a left ventricular ejection fraction of 55% to 60%. There is no evidence of left ventricular hypertrophy or left atrial enlargement, suggesting that her systemic hypertension actually on the average cannot have been high for long. There is a mention of grade 1 diastolic dysfunction, but looking at the actual transmitral velocities these findings are likely normal for a 79 year old patient. In addition, her anular diastolic velocities are quite high for a near octogenarian. I would estimate her diastolic function to be actually normal.  She has normal renal function and normal thyroid function. She underwent a ACTH stim test in January which was normal (I wonder if the plan actually been to do a dexamethasone suppression test) serum aldosterone levels were not elevated. These tests were done in January through [facility]. She has had a normal CT of the head. We checked a sedimentation rate as a screening test for temporal arteritis and this was very low, although excluding that diagnosis. ANA was normal.  She continues to be troubled by severe headaches. These persist despite the fact that  her intraocular pressure has most recently been determined to be normal after treatment of her glaucoma with 2 different types of eye drops.   She denies any true chest pain. When asked which she exactly feels in her chest she seems to describe something more a 10 to palpitations that are brief and not associated with other complaints. She is rarely lightheaded and has not had syncope. Again, her biggest complaint is headache and trigeminal-neuralgia-type shooting pains. She has frequent nausea and indigestion. She feels that her vision continues to deteriorate. She is wearing dark sunglasses in the office today    Allergies  Allergen Reactions  . Codeine Other (See Comments)  . Meperidine Other (See Comments)  . Promethazine     Other reaction(s): Hypotension (ALLERGY/intolerance)  . Propranolol Nausea And Vomiting  . Shellfish Allergy Nausea And Vomiting  . Tazarotene Other (See Comments)  . Iodinated Diagnostic Agents Rash and Nausea Only    Current Outpatient Prescriptions  Medication Sig Dispense Refill  . ALPRAZOLAM PO Take by mouth.       . docusate sodium (COLACE) 50 MG capsule Take by mouth 2 (two) times daily.      . dorzolamide-timolol (COSOPT) 22.3-6.8 MG/ML ophthalmic solution       . ergocalciferol (VITAMIN D2) 50000 UNITS capsule Take 50,000 Units by mouth once a week.        . fluocinonide cream (LIDEX) 0.05 % Apply topically 2 (two) times daily.      Marland Kitchen levothyroxine (SYNTHROID, LEVOTHROID) 75 MCG tablet Take 75 mcg by mouth daily.        Marland Kitchen  lisinopril (PRINIVIL,ZESTRIL) 30 MG tablet Take 1 tablet (30 mg total) by mouth every morning.  30 tablet  5  . LORazepam (ATIVAN) 1 MG tablet Take 1 mg by mouth 2 (two) times daily.      . nebivolol (BYSTOLIC) 5 MG tablet Take 5 mg by mouth daily.        . pantoprazole (PROTONIX) 40 MG tablet       . Probiotic Product (SOLUBLE FIBER/PROBIOTICS PO) Take by mouth.        Jeananne Rama Sulfate (EYE DROPS A/C OP) Apply to eye.          No current facility-administered medications for this visit.    Past Medical History  Diagnosis Date  . Headache(784.0)   . Lightheadedness   . SOB (shortness of breath)   . Pain, lower leg     Calf  . Multiple allergies   . Fatigue   . Hiatal hernia   . Reflux   . Problem of menstruation   . Urinary problem   . Arthritis   . Thyroid disease   . Anxiety   . Hypothyroidism   . Anxiety   . Glaucoma   . Myalgia   . Migraine headache   . High blood pressure 02/16/2012    RENAL DOPPLER - normal  . Labile hypertension   . Chest pain     CARDIOLITE - no significant symptoms, EKG changes, or arrhythmias  . Dyspnea 02/16/2012    2D ECHO - EF 55-60%, normal    Past Surgical History  Procedure Laterality Date  . Abdominal hysterectomy  1976  . Cystectomy  1974    Intestine  . 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 Cholecystectomy  2000  . Eye surgery  2003    Family History  Problem Relation Age of Onset  . Heart attack Father   . Stroke Father   . Cancer Sister   . Cancer Brother   . Asthma Brother   . Cancer Brother     History   Social History  . Marital Status: Married    Spouse Name: N/A    Number of Children: N/A  . Years of Education: N/A   Occupational History  . Retired    Social History Main Topics  . Smoking status: Never Smoker   . Smokeless tobacco: Not on file  . Alcohol Use: No  . Drug Use: No  . Sexually Active: Not on file   Other Topics Concern  . Not on file   Social History Narrative  . No narrative on file    Review of systems: The patient specifically denies any chest pain at rest exertion, dyspnea at rest or with exertion, orthopnea, paroxysmal nocturnal dyspnea, syncope,  focal neurological deficits (she states "my vision is worse", but finds it hard to describe in what way. She clearly does not describe visual field cuts"., intermittent claudication, lower extremity edema, unexplained weight gain, cough, hemoptysis or  wheezing.   PHYSICAL EXAM BP 152/68  Pulse 59  Ht 5\' 4"  (1.626 m)  Wt 123 lb 6.4 oz (55.974 kg)  BMI 21.17 kg/m2  General: Alert, oriented x3, no distress Head: no evidence of trauma, PERRL, EOMI, no exophtalmos or lid lag, no myxedema, no xanthelasma; normal ears, nose and oropharynx Neck: normal jugular venous pulsations and no hepatojugular reflux; brisk carotid pulses without delay and no carotid bruits Chest: clear to auscultation, no signs of consolidation by percussion or palpation, normal fremitus, symmetrical  and full respiratory excursions Cardiovascular: normal position and quality of the apical impulse, regular rhythm, normal first and second heart sounds, no murmurs, rubs or gallops Abdomen: no tenderness or distention, no masses by palpation, no abnormal pulsatility or arterial bruits, normal bowel sounds, no hepatosplenomegaly Extremities: no clubbing, cyanosis or edema; 2+ radial, ulnar and brachial pulses bilaterally; 2+ right femoral, posterior tibial and dorsalis pedis pulses; 2+ left femoral, posterior tibial and dorsalis pedis pulses; no subclavian or femoral bruits Neurological: grossly nonfocal   EKG: Sinus rhythm, normal tracing    ASSESSMENT AND PLAN I'm afraid I have been unable to find a satisfactory explanation for Mrs. Bilyeu symptoms. Pheochromocytoma has been considered and I am told that at some point she was screened for this disorder although I cannot find the actual studies. She does not have other symptoms to suggest this disorder. I think part of her problem with her blood pressure is clearly situational, as exemplified by the severely elevated blood pressure when she presents for surgical procedures. She is an anxious lady. On the other hand, she states that in other situations of high stress her blood pressure has not been nearly as elevated. She is scheduled to see a hypertension specialist in [place]. She believes his name is Dr. Shawnee Knapp. I have  encouraged her to keep this appointment. I will be happy to provide him with any of the results of her studies that we have performed. No changes were made to her current medicines.   Orders Placed This Encounter  Procedures  . EKG 12-Lead   Meds ordered this encounter  Medications  . fluocinonide cream (LIDEX) 0.05 %    Sig: Apply topically 2 (two) times daily.  Marland Kitchen docusate sodium (COLACE) 50 MG capsule    Sig: Take by mouth 2 (two) times daily.    Junious Silk, MD, [facility] [phone] office [phone] pager

## 2012-09-29 ENCOUNTER — Encounter: Payer: Self-pay | Admitting: Cardiovascular Disease

## 2012-09-29 DIAGNOSIS — H409 Unspecified glaucoma: Secondary | ICD-10-CM | POA: Insufficient documentation

## 2012-10-18 DIAGNOSIS — L03213 Periorbital cellulitis: Secondary | ICD-10-CM | POA: Insufficient documentation

## 2013-01-30 DIAGNOSIS — H401333 Pigmentary glaucoma, bilateral, severe stage: Secondary | ICD-10-CM | POA: Insufficient documentation

## 2013-01-30 DIAGNOSIS — H548 Legal blindness, as defined in USA: Secondary | ICD-10-CM | POA: Insufficient documentation

## 2013-01-30 HISTORY — DX: Pigmentary glaucoma, bilateral, severe stage: H40.1333

## 2013-02-03 DIAGNOSIS — Z8619 Personal history of other infectious and parasitic diseases: Secondary | ICD-10-CM | POA: Insufficient documentation

## 2013-02-03 DIAGNOSIS — H04559 Acquired stenosis of unspecified nasolacrimal duct: Secondary | ICD-10-CM | POA: Insufficient documentation

## 2013-02-03 DIAGNOSIS — Z8669 Personal history of other diseases of the nervous system and sense organs: Secondary | ICD-10-CM | POA: Insufficient documentation

## 2013-02-03 DIAGNOSIS — H04209 Unspecified epiphora, unspecified lacrimal gland: Secondary | ICD-10-CM | POA: Insufficient documentation

## 2013-02-03 HISTORY — DX: Acquired stenosis of unspecified nasolacrimal duct: H04.559

## 2013-04-01 DIAGNOSIS — K219 Gastro-esophageal reflux disease without esophagitis: Secondary | ICD-10-CM | POA: Insufficient documentation

## 2013-05-01 ENCOUNTER — Ambulatory Visit: Payer: Medicare Other | Admitting: Cardiovascular Disease

## 2013-12-10 ENCOUNTER — Ambulatory Visit (INDEPENDENT_AMBULATORY_CARE_PROVIDER_SITE_OTHER): Payer: Medicare Other | Admitting: Cardiovascular Disease

## 2013-12-10 ENCOUNTER — Encounter: Payer: Self-pay | Admitting: Cardiovascular Disease

## 2013-12-10 VITALS — BP 161/61 | HR 60 | Ht 64.0 in | Wt 120.6 lb

## 2013-12-10 DIAGNOSIS — R0789 Other chest pain: Secondary | ICD-10-CM

## 2013-12-10 DIAGNOSIS — I10 Essential (primary) hypertension: Secondary | ICD-10-CM

## 2013-12-10 DIAGNOSIS — R079 Chest pain, unspecified: Secondary | ICD-10-CM

## 2013-12-10 NOTE — Patient Instructions (Signed)
Your physician has requested that you have en exercise stress myoview. For further information please visit [email or web address] Please follow instruction sheet, as given.  Dr. Royann Shiversroitoru recommends that you schedule a follow-up appointment in: One year.

## 2013-12-11 NOTE — Progress Notes (Signed)
Patient ID: Leah MillinMary E Moise, female   DOB: [birth year], 80 y.o.   MRN: [id number]      Reason for office visit HTN  Se has not had any new cardiac events, but still feels weak after a long year with ocular and maxillary infection, antibiotic related C. 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 She describes a nonexertional deep discomfort in the low retrosternal area that is sometimes positional, not meal related and worries it is a sign of a heart condition. BP has generally been well controlled. She takes Bystolic daily and lisinopril only prn BP> 160.   Allergies  Allergen Reactions  . Codeine Other (See Comments)  . Meperidine Other (See Comments)  . Promethazine     Other reaction(s): Hypotension (ALLERGY/intolerance)  . Propranolol Nausea And Vomiting  . Shellfish Allergy Nausea And Vomiting  . Tazarotene Other (See Comments)  . Iodinated Diagnostic Agents Rash and Nausea Only    Current Outpatient Prescriptions  Medication Sig Dispense Refill  . docusate sodium (COLACE) 50 MG capsule Take by mouth 2 (two) times daily.      . dorzolamide-timolol (COSOPT) 22.3-6.8 MG/ML ophthalmic solution       . ergocalciferol (VITAMIN D2) 50000 UNITS capsule Take 50,000 Units by mouth once a week.        . fluocinonide cream (LIDEX) 0.05 % Apply topically 2 (two) times daily.      Marland Kitchen. levothyroxine (SYNTHROID, LEVOTHROID) 75 MCG tablet Take 75 mcg by mouth daily.        Marland Kitchen. lisinopril (PRINIVIL,ZESTRIL) 30 MG tablet Take 1 tablet (30 mg total) by mouth every morning.  30 tablet  5  . LORazepam (ATIVAN) 1 MG tablet Take 1 mg by mouth 2 (two) times daily.      . nebivolol (BYSTOLIC) 5 MG tablet Take 5 mg by mouth daily.        . pantoprazole (PROTONIX) 40 MG tablet       . Probiotic Product (SOLUBLE FIBER/PROBIOTICS PO) Take by mouth.        Jeananne Rama. Tetrahydrozoline-Zn Sulfate (EYE DROPS A/C OP) Apply to eye.         No current facility-administered  medications for this visit.    Past Medical History  Diagnosis Date  . Headache(784.0)   . Lightheadedness   . SOB (shortness of breath)   . Pain, lower leg     Calf  . Multiple allergies   . Fatigue   . Hiatal hernia   . Reflux   . Problem of menstruation   . Urinary problem   . Arthritis   . Thyroid disease   . Anxiety   . Hypothyroidism   . Anxiety   . Glaucoma   . Myalgia   . Migraine headache   . High blood pressure 02/16/2012    RENAL DOPPLER - normal  . Labile hypertension   . Chest pain     CARDIOLITE - no significant symptoms, EKG changes, or arrhythmias  . Dyspnea 02/16/2012    2D ECHO - EF 55-60%, normal    Past Surgical History  Procedure Laterality Date  . Abdominal hysterectomy  1976  . Cystectomy  1974    Intestine  . 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 Cholecystectomy  2000  . Eye surgery  2003    Family History  Problem Relation Age of Onset  . Heart attack Father   . Stroke Father   . Cancer Sister   .  Cancer Brother   . Asthma Brother   . Cancer Brother     History   Social History  . Marital Status: Married    Spouse Name: N/A    Number of Children: N/A  . Years of Education: N/A   Occupational History  . Retired    Social History Main Topics  . Smoking status: Never Smoker   . Smokeless tobacco: Not on file  . Alcohol Use: No  . Drug Use: No  . Sexual Activity: Not on file   Other Topics Concern  . Not on file   Social History Narrative  . No narrative on file    Review of systems: Numerous somatic complaints - fatigue, insomnia, arthralgia and myalgia, headaches, photophobia. Denies exertional dyspnea, lower extremity edema, syncope, palpitations, recent neuro complaints, claudication, weight change.  PHYSICAL EXAM BP 161/61  Pulse 60  Ht 5\' 4"  (1.626 m)  Wt 120 lb 9.6 oz (54.704 kg)  BMI 20.69 kg/m2 Rechecked 151/70  General: Alert, oriented x3, no distress Head: no evidence of trauma, PERRL, EOMI, no  exophtalmos or lid lag, no myxedema, no xanthelasma; normal ears, nose and oropharynx Neck: normal jugular venous pulsations and no hepatojugular reflux; brisk carotid pulses without delay and no carotid bruits Chest: clear to auscultation, no signs of consolidation by percussion or palpation, normal fremitus, symmetrical and full respiratory excursions Cardiovascular: normal position and quality of the apical impulse, regular rhythm, normal first and second heart sounds, no murmurs, rubs or gallops Abdomen: no tenderness or distention, no masses by palpation, no abnormal pulsatility or arterial bruits, normal bowel sounds, no hepatosplenomegaly Extremities: no clubbing, cyanosis or edema; 2+ radial, ulnar and brachial pulses bilaterally; 2+ right femoral, posterior tibial and dorsalis pedis pulses; 2+ left femoral, posterior tibial and dorsalis pedis pulses; no subclavian or femoral bruits Neurological: grossly nonfocal  EKG: NSR   ASSESSMENT AND PLAN  Her chest pain is atypical, but not incompatible with CAD. Recommend a nuclear stress test. She had a bad experience with adenosine. Explained that Eugenie BirksLexiscan has a better side effect profile. Will try a treadmill, but if her BP increases excessively or if she cannot achieve target heart rate, Lexiscan will be used. Hold Bystolic 24h before stress test (she takes in PM) and take the lisinopril instead, to allow HR to increase.  Orders Placed This Encounter  Procedures  . Myocardial Perfusion Imaging  . EKG 12-Lead   No orders of the defined types were placed in this encounter.    Junious SilkROITORU,Duglas Heier  Reilly Molchan, MD, [facility] [phone] office [phone] pager

## 2013-12-12 ENCOUNTER — Telehealth (HOSPITAL_COMMUNITY): Payer: Self-pay

## 2013-12-12 DIAGNOSIS — R079 Chest pain, unspecified: Secondary | ICD-10-CM | POA: Insufficient documentation

## 2013-12-12 NOTE — Telephone Encounter (Signed)
Encounter complete. 

## 2013-12-17 ENCOUNTER — Ambulatory Visit (HOSPITAL_COMMUNITY)
Admission: RE | Admit: 2013-12-17 | Discharge: 2013-12-17 | Disposition: A | Discharge status: Home / Self Care | Payer: Medicare Other | Source: Ambulatory Visit | Attending: Cardiovascular Disease | Admitting: Cardiovascular Disease

## 2013-12-17 DIAGNOSIS — Z8249 Family history of ischemic heart disease and other diseases of the circulatory system: Secondary | ICD-10-CM | POA: Diagnosis not present

## 2013-12-17 DIAGNOSIS — R079 Chest pain, unspecified: Secondary | ICD-10-CM

## 2013-12-17 DIAGNOSIS — R42 Dizziness and giddiness: Secondary | ICD-10-CM | POA: Diagnosis not present

## 2013-12-17 DIAGNOSIS — R0609 Other forms of dyspnea: Secondary | ICD-10-CM | POA: Insufficient documentation

## 2013-12-17 DIAGNOSIS — I10 Essential (primary) hypertension: Secondary | ICD-10-CM | POA: Insufficient documentation

## 2013-12-17 MED ORDER — TECHNETIUM TC 99M SESTAMIBI GENERIC - CARDIOLITE
10.0000 | Freq: Once | INTRAVENOUS | Status: AC | PRN
  Administered 2013-12-17: 10 via INTRAVENOUS

## 2013-12-17 MED ORDER — TECHNETIUM TC 99M SESTAMIBI GENERIC - CARDIOLITE
30.0000 | Freq: Once | INTRAVENOUS | Status: AC | PRN
Start: 2013-12-17 — End: 2013-12-17
  Administered 2013-12-17: 30 via INTRAVENOUS

## 2013-12-17 NOTE — Procedures (Addendum)
[facility] [address] [place] [address] [phone]  Cardiology Nuclear Med Study  Joelene MillinMary E Jeffcoat is a 80 y.o. female     MRN : [id number]     DOB: 01/15/1931  Procedure Date: 12/17/2013  Nuclear Med Background Indication for Stress Test:  Evaluation for Ischemia History:  No prior cardiac or respiratory history reported; Last NUC MPI on 03/15/2012-nonischemic;EF=74%;ECHO on 02/16/2012-LVEF=55-60% Cardiac Risk Factors: Family History - CAD, Hypertension and thyroid disease  Symptoms:  Chest Pain, Dizziness, DOE, Fatigue, Light-Headedness, Nausea, SOB and lower leg pain   Nuclear Pre-Procedure Caffeine/Decaff Intake:  1:00am NPO After: 11am   IV Site: R Forearm  IV 0.9% NS with Angio Cath:  22g  Chest Size (in):  n/a IV Started by: Berdie OgrenAmanda Wease, RN  Height: 5\' 4"  (1.626 m)  Cup Size: B  BMI:  Body mass index is 20.59 kg/(m^2). Weight:  120 lb (54.432 kg)   Tech Comments:  n/a    Nuclear Med Study 1 or 2 day study: 1 day  Stress Test Type:  Stress  Order Authorizing Provider:  Thurmon FairMihai Croitoru, MD   Resting Radionuclide: Technetium 2373m Sestamibi  Resting Radionuclide Dose: 10.8 mCi   Stress Radionuclide:  Technetium 5773m Sestamibi  Stress Radionuclide Dose: 31.4 mCi           Stress Protocol Rest HR:67 Stress HR:120  Rest BP: 185/89 Stress BP: 217/93  Exercise Time (min): 3:30 METS: 5.20   Predicted Max HR: 140 bpm % Max HR: 85.71 bpm Rate Pressure Product: [id number]  Dose of Adenosine (mg):  n/a Dose of Lexiscan: n/a mg  Dose of Atropine (mg): n/a Dose of Dobutamine: n/a mcg/kg/min (at max HR)  Stress Test Technologist: Ernestene MentionGwen Farrington, CCT Nuclear Technologist: Koren Shiverobin Moffitt, CNMT   Rest Procedure:  Myocardial perfusion imaging was performed at rest 45 minutes following the intravenous administration of Technetium 9873m Sestamibi. Stress Procedure:  The patient performed treadmill exercise using a Bruce   Protocol for 3 minutes 30 seconds. The patient stopped due to extreme shortness of breath and fatigue. Patient denied any chest pain.  There were significant ST-T wave changes.  Technetium 973m Sestamibi was injected IV at peak exercise and myocardial perfusion imaging was performed after a brief delay.  Transient Ischemic Dilatation (Normal <1.22):  0.97 QGS EDV:  52 ml QGS ESV:  16 ml LV Ejection Fraction: 70%        Rest ECG: NSR with non-specific ST-T wave changes  Stress ECG: Significant ST abnormalities consistent with ischemia.  QPS Raw Data Images:  Acquisition technically good; normal left ventricular size. Stress Images:  There is decreased uptake in the anterior wall. Rest Images:  There is decreased uptake in the anterior wall, slightly less prominent compared to the stress images. Subtraction (SDS):  These findings are consistent with possible shifting breast attenuation; cannot R/O mild anterior ischemia.  Impression Exercise Capacity:  Poor exercise capacity. BP Response:  Hypertensive blood pressure response. Clinical Symptoms:  There is dyspnea. ECG Impression:  Significant ST abnormalities consistent with ischemia. Comparison with Prior Nuclear Study: Compared to 03/15/12 report, anterior defect is new.  Overall Impression:  Low risk stress nuclear study with a small, mild intensity, partially reversible anterior defect possibly related to shifting breast attenuation; cannot R/O mild anterior ischemia.  LV Wall Motion:  NL LV Function; NL Wall Motion   Olga MillersBrian Asiya Cutbirth, MD  12/17/2013 4:53 PM

## 2014-07-27 HISTORY — PX: COLONOSCOPY: SHX174

## 2014-12-16 DIAGNOSIS — M47812 Spondylosis without myelopathy or radiculopathy, cervical region: Secondary | ICD-10-CM | POA: Insufficient documentation

## 2014-12-16 DIAGNOSIS — M5481 Occipital neuralgia: Secondary | ICD-10-CM

## 2014-12-16 HISTORY — DX: Spondylosis without myelopathy or radiculopathy, cervical region: M47.812

## 2014-12-16 HISTORY — DX: Occipital neuralgia: M54.81

## 2015-01-11 ENCOUNTER — Encounter: Payer: Self-pay | Admitting: Cardiovascular Disease

## 2015-01-11 ENCOUNTER — Ambulatory Visit (INDEPENDENT_AMBULATORY_CARE_PROVIDER_SITE_OTHER): Payer: Medicare Other | Admitting: Cardiovascular Disease

## 2015-01-11 VITALS — BP 136/64 | HR 56 | Resp 16 | Ht 63.0 in | Wt 120.0 lb

## 2015-01-11 DIAGNOSIS — F419 Anxiety disorder, unspecified: Secondary | ICD-10-CM

## 2015-01-11 DIAGNOSIS — R072 Precordial pain: Secondary | ICD-10-CM | POA: Diagnosis not present

## 2015-01-11 DIAGNOSIS — I10 Essential (primary) hypertension: Secondary | ICD-10-CM | POA: Diagnosis not present

## 2015-01-11 DIAGNOSIS — R0989 Other specified symptoms and signs involving the circulatory and respiratory systems: Secondary | ICD-10-CM

## 2015-01-11 NOTE — Patient Instructions (Signed)
Your physician wants you to follow-up in: 1 Year. You will receive a reminder letter in the mail two months in advance. If you don't receive a letter, please call our office to schedule the follow-up appointment.  

## 2015-01-11 NOTE — Progress Notes (Signed)
Patient ID: Leah Pittman, female   DOB: [birth year], 81 y.o.   MRN: [id number]     Cardiology Office Note   Date:  01/13/2015   ID:  GETSEMANI LINDON, DOB 01-Oct-1930, MRN [id number]  PCP:  Wilmer Floor., MD  Cardiologist:   Thurmon Fair, MD   Chief Complaint  Patient presents with  . Annual Exam  . Dizziness  . Chest Pain  . Shortness of Breath      History of Present Illness: Leah Pittman is a 81 y.o. female who presents for hypertension and recurrent problems with varying locations of chest discomfort.  Last August she had a hip injury and was admitted to [facility] and from there to a rehabilitation facility. She is still undergoing physical therapy and is using a cane. Labs have been performed by Dr. Sedalia Muta in [place].  She continues to describe a variety of types of pain in the general area of her chest. The when she finds most worrisome is right underneath her left breast and radiates slightly toward the axilla. It seems to have a pretty clear association with meals and is in no way associated with activity. She also has pain in the posterior left neck that changes with changes in position.  She reports her blood pressure control has been fair but complains of fatigue. She denies exertional dyspnea and has not had syncope or palpitations.  Past Medical History  Diagnosis Date  . Headache(784.0)   . Lightheadedness   . SOB (shortness of breath)   . Pain, lower leg     Calf  . Multiple allergies   . Fatigue   . Hiatal hernia   . Reflux   . Problem of menstruation   . Urinary problem   . Arthritis   . Thyroid disease   . Anxiety   . Hypothyroidism   . Anxiety   . Glaucoma   . Myalgia   . Migraine headache   . High blood pressure 02/16/2012    RENAL DOPPLER - normal  . Labile hypertension   . Chest pain     CARDIOLITE - no significant symptoms, EKG changes, or arrhythmias  . Dyspnea 02/16/2012    2D ECHO - EF 55-60%, normal     Past Surgical History  Procedure Laterality Date  . Abdominal hysterectomy  1976  . Cystectomy  1974    Intestine  . 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 Cholecystectomy  2000  . Eye surgery  2003     Current Outpatient Prescriptions  Medication Sig Dispense Refill  . alendronate (FOSAMAX) 70 MG tablet Take 70 mg by mouth daily.    . Ascorbic Acid (VITAMIN C) 1000 MG tablet Take 1,000 mg by mouth daily.    . calcium carbonate (CALCIUM 600) 600 MG TABS tablet Take 600 mg by mouth daily.    . Cholecalciferol (D 1000) 1000 UNITS capsule Take 1,000 mg by mouth daily.    . cloNIDine (CATAPRES) 0.1 MG tablet Take 0.1 mg by mouth daily.    . dorzolamide-timolol (COSOPT) 22.3-6.8 MG/ML ophthalmic solution     . levothyroxine (SYNTHROID, LEVOTHROID) 75 MCG tablet Take 88 mcg by mouth daily.     Marland Kitchen lisinopril (PRINIVIL,ZESTRIL) 30 MG tablet Take 1 tablet (30 mg total) by mouth every morning. (Patient taking differently: Take 10 mg by mouth every morning. ) 30 tablet 5  . LORazepam (ATIVAN) 1 MG tablet Take 1 mg by mouth 2 (two) times  daily.    . Magnesium 250 MG TABS Take 250 mg by mouth daily.    . nebivolol (BYSTOLIC) 5 MG tablet Take 5 mg by mouth daily.      . ondansetron (ZOFRAN) 4 MG tablet Take 4 mg by mouth daily.    . pantoprazole (PROTONIX) 40 MG tablet Take 40 mg by mouth daily.     . pravastatin (PRAVACHOL) 20 MG tablet Take 20 mg by mouth daily.    . Probiotic Product (SOLUBLE FIBER/PROBIOTICS PO) Take by mouth.      Jeananne Rama Sulfate (EYE DROPS A/C OP) Apply to eye.      . Travoprost, BAK Free, (TRAVATAN Z) 0.004 % SOLN ophthalmic solution Place 1 drop into both eyes daily.     No current facility-administered medications for this visit.    Allergies:   Codeine; Meperidine; Promethazine; Propranolol; Shellfish allergy; Tazarotene; and Iodinated diagnostic agents    Social History:  The patient  reports that she has never smoked. She does not have any smokeless  tobacco history on file. She reports that she does not drink alcohol or use illicit drugs.   Family History:  The patient's family history includes Asthma in her brother; Cancer in her brother, brother, and sister; Heart attack in her father; Stroke in her father.    ROS:  Please see the history of present illness.    Otherwise, review of systems positive for none.   All other systems are reviewed and negative.    PHYSICAL EXAM: VS:  BP 136/64 mmHg  Pulse 56  Resp 16  Ht  (1.6 m)  Wt 120 lb (54.432 kg)  BMI 21.26 kg/m2 , BMI Body mass index is 21.26 kg/(m^2).  General: Alert, oriented x3, no distress Head: no evidence of trauma, PERRL, EOMI, no exophtalmos or lid lag, no myxedema, no xanthelasma; normal ears, nose and oropharynx Neck: normal jugular venous pulsations and no hepatojugular reflux; brisk carotid pulses without delay and no carotid bruits Chest: clear to auscultation, no signs of consolidation by percussion or palpation, normal fremitus, symmetrical and full respiratory excursions Cardiovascular: normal position and quality of the apical impulse, regular rhythm, normal first and second heart sounds, no murmurs, rubs or gallops Abdomen: no tenderness or distention, no masses by palpation, no abnormal pulsatility or arterial bruits, normal bowel sounds, no hepatosplenomegaly Extremities: no clubbing, cyanosis or edema; 2+ radial, ulnar and brachial pulses bilaterally; 2+ right femoral, posterior tibial and dorsalis pedis pulses; 2+ left femoral, posterior tibial and dorsalis pedis pulses; no subclavian or femoral bruits Neurological: grossly nonfocal Psych: euthymic mood, full affect   EKG:  EKG is ordered today. The ekg ordered today demonstrates sinus bradycardia 56 bpm, QTC 416 ms   Recent Labs: No results found for requested labs within last 365 days.    Lipid Panel No results found for: CHOL, TRIG, HDL, CHOLHDL, VLDL, LDLCALC, LDLDIRECT    Wt Readings  from Last 3 Encounters:  01/11/15 120 lb (54.432 kg)  12/17/13 120 lb (54.432 kg)  12/10/13 120 lb 9.6 oz (54.704 kg)     ASSESSMENT AND PLAN:  1. Atypical chest pain with normal ECG and low risk nuclear stress test 1 year ago. Coronary risk factor profile is relatively benign with the exception of age and treated hypertension. No plan for invasive evaluation.  2. Essential hypertension, well controlled. She is on 3 different antihypertensive agents. Is possible that the clonidine is responsible for her fatigue. I'm not sure she really needs it  for her blood pressure. She is only taking it once daily anyway.  3. Hyperlipidemia on very low dose statin, I don't have any recent laboratory tests we will try to retrieve them from Dr. Sedalia Mutaox. About 3 years ago, her LDL cholesterol was 113. I think she was not taking any medications for lipids at that time.     Current medicines are reviewed at length with the patient today.  The patient does not have concerns regarding medicines. About 3 years ago, her LDL cholesterol was 113.  The following changes have been made:  no change  Labs/ tests ordered today include:  Orders Placed This Encounter  Procedures  . EKG 12-Lead     Patient Instructions  Your physician wants you to follow-up in: 1 Year. You will receive a reminder letter in the mail two months in advance. If you don't receive a letter, please call our office to schedule the follow-up appointment.    Joie BimlerSigned, Tiernan Millikin, MD  01/13/2015 10:53 AM    Thurmon FairMihai Gautham Hewins, MD, [facility] [phone] office [phone] pager

## 2015-01-12 ENCOUNTER — Encounter: Payer: Self-pay | Admitting: Cardiovascular Disease

## 2015-01-13 ENCOUNTER — Encounter: Payer: Self-pay | Admitting: Cardiovascular Disease

## 2015-01-13 DIAGNOSIS — R072 Precordial pain: Secondary | ICD-10-CM | POA: Insufficient documentation

## 2015-01-13 HISTORY — DX: Precordial pain: R07.2

## 2015-06-30 ENCOUNTER — Ambulatory Visit (INDEPENDENT_AMBULATORY_CARE_PROVIDER_SITE_OTHER): Payer: Medicare Other | Admitting: Cardiovascular Disease

## 2015-06-30 ENCOUNTER — Encounter: Payer: Self-pay | Admitting: Cardiovascular Disease

## 2015-06-30 VITALS — BP 146/68 | HR 75 | Ht 64.0 in | Wt 123.0 lb

## 2015-06-30 DIAGNOSIS — R0789 Other chest pain: Secondary | ICD-10-CM

## 2015-06-30 DIAGNOSIS — R079 Chest pain, unspecified: Secondary | ICD-10-CM

## 2015-06-30 DIAGNOSIS — I10 Essential (primary) hypertension: Secondary | ICD-10-CM | POA: Diagnosis not present

## 2015-06-30 DIAGNOSIS — R0989 Other specified symptoms and signs involving the circulatory and respiratory systems: Secondary | ICD-10-CM

## 2015-06-30 NOTE — Patient Instructions (Addendum)
Your physician recommends that you continue on your current medications as directed. Please refer to the Current Medication list given to you today.  Dr Croitoru recommends that you schedule a follow-up appointment in 1 year. You will receive a reminder letter in the mail two months in advance. If you don't receive a letter, please call our office to schedule the follow-up appointment.  If you need a refill on your cardiac medications before your next appointment, please call your pharmacy. 

## 2015-06-30 NOTE — Progress Notes (Signed)
Patient ID: Leah Pittman, female   DOB: 1931/01/25, 82 y.o.   MRN: [id number]    Cardiology Office Note    Date:  07/01/2015   ID:  Leah Pittman, DOB [birth year], MRN [id number]  PCP:  Blane Ohara, MD  Cardiologist:   Thurmon Fair, MD   Chief Complaint  Patient presents with  . Follow-up  . Shortness of Breath  . Chest Pain  . Dizziness    History of Present Illness:  Leah Pittman is a 82 y.o. female "labile hypertension" in a variety of somatic complaints that include chest discomfort. Recently the chest complaints have not been a prominent issue. She complains again of "sharp pains in the head" but is mostly troubled by fatigue and very weak legs. She thinks that her balance is worse than in the past. She avoids going to gatherings. If somebody pokes her or makes physical contact with her, it makes her feel off balance and she is afraid of falling. She does not have dyspnea at rest or with exertion and denies leg edema. She has not had claudication, frank focal neurological weakness or loss of sensation, palpitations or syncope. She underwent a nuclear perfusion study in October 2015 with normal results and had a normal echocardiogram in December 2013. No evidence of renal artery stenosis in 2013    Past Medical History  Diagnosis Date  . Headache(784.0)   . Lightheadedness   . SOB (shortness of breath)   . Pain, lower leg     Calf  . Multiple allergies   . Fatigue   . Hiatal hernia   . Reflux   . Problem of menstruation   . Urinary problem   . Arthritis   . Thyroid disease   . Anxiety   . Hypothyroidism   . Anxiety   . Glaucoma   . Myalgia   . Migraine headache   . High blood pressure 02/16/2012    RENAL DOPPLER - normal  . Labile hypertension   . Chest pain     CARDIOLITE - no significant symptoms, EKG changes, or arrhythmias  . Dyspnea 02/16/2012    2D ECHO - EF 55-60%, normal    Past Surgical History  Procedure Laterality Date  . Abdominal  hysterectomy  1976  . Cystectomy  1974    Intestine  . 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 Cholecystectomy  2000  . Eye surgery  2003    Current Medications: Outpatient Prescriptions Prior to Visit  Medication Sig Dispense Refill  . Ascorbic Acid (VITAMIN C) 1000 MG tablet Take 1,000 mg by mouth daily.    . calcium carbonate (CALCIUM 600) 600 MG TABS tablet Take 600 mg by mouth daily.    . cloNIDine (CATAPRES) 0.1 MG tablet Take 0.1 mg by mouth daily.    . dorzolamide-timolol (COSOPT) 22.3-6.8 MG/ML ophthalmic solution     . levothyroxine (SYNTHROID, LEVOTHROID) 75 MCG tablet Take 88 mcg by mouth daily.     Marland Kitchen lisinopril (PRINIVIL,ZESTRIL) 30 MG tablet Take 1 tablet (30 mg total) by mouth every morning. (Patient taking differently: Take 10 mg by mouth every morning. ) 30 tablet 5  . LORazepam (ATIVAN) 1 MG tablet Take 1 mg by mouth 2 (two) times daily.    . Magnesium 250 MG TABS Take 250 mg by mouth daily.    . ondansetron (ZOFRAN) 4 MG tablet Take 4 mg by mouth daily.    . pantoprazole (PROTONIX) 40 MG tablet Take 40 mg by  mouth daily.     . Probiotic Product (SOLUBLE FIBER/PROBIOTICS PO) Take by mouth.      Jeananne Rama. Tetrahydrozoline-Zn Sulfate (EYE DROPS A/C OP) Apply to eye.      . Travoprost, BAK Free, (TRAVATAN Z) 0.004 % SOLN ophthalmic solution Place 1 drop into both eyes daily.    Marland Kitchen. alendronate (FOSAMAX) 70 MG tablet Take 70 mg by mouth daily.    . Cholecalciferol (D 1000) 1000 UNITS capsule Take 1,000 mg by mouth daily.    . nebivolol (BYSTOLIC) 5 MG tablet Take 5 mg by mouth daily.      . pravastatin (PRAVACHOL) 20 MG tablet Take 20 mg by mouth daily.     No facility-administered medications prior to visit.     Allergies:   Codeine; Meperidine; Promethazine; Propranolol; Shellfish allergy; Tazarotene; and Iodinated diagnostic agents   Social History   Social History  . Marital Status: Married    Spouse Name: N/A  . Number of Children: N/A  . Years of Education: N/A    Occupational History  . Retired    Social History Main Topics  . Smoking status: Never Smoker   . Smokeless tobacco: None  . Alcohol Use: No  . Drug Use: No  . Sexual Activity: Not Asked   Other Topics Concern  . None   Social History Narrative     Family History:  The patient's family history includes Asthma in her brother; Cancer in her brother, brother, and sister; Heart attack in her father; Stroke in her father.   ROS:   Please see the history of present illness.    ROS All other systems reviewed and are negative.   PHYSICAL EXAM:   VS:  BP 146/68 mmHg  Pulse 75  Ht 5\' 4"  (1.626 m)  Wt 55.792 kg (123 lb)  BMI 21.10 kg/m2   Blood pressure rechecked 15 minutes later was 130/72 mmHg GEN: Well nourished, well developed, in no acute distress HEENT: normal Neck: no JVD, carotid bruits, or masses Cardiac: RRR; no murmurs, rubs, or gallops,no edema  Respiratory:  clear to auscultation bilaterally, normal work of breathing GI: soft, nontender, nondistended, + BS MS: no deformity or atrophy Skin: warm and dry, no rash Neuro:  Alert and Oriented x 3, Strength and sensation are intact Psych: euthymic mood, full affect  Wt Readings from Last 3 Encounters:  06/30/15 55.792 kg (123 lb)  01/11/15 54.432 kg (120 lb)  12/17/13 54.432 kg (120 lb)      Studies/Labs Reviewed:   EKG:  EKG is not ordered today.     ASSESSMENT:    1. Chest pain of uncertain etiology   2. Labile hypertension      PLAN:  In order of problems listed above:  1. Not bothering her much right now, sounded mostly musculoskeletal in the past 2. Her blood pressure is actually very well controlled in the office today. I think she should stay on the same medical regimen.    Medication Adjustments/Labs and Tests Ordered: Current medicines are reviewed at length with the patient today.  Concerns regarding medicines are outlined above.  Medication changes, Labs and Tests ordered today are  listed in the Patient Instructions below. Patient Instructions  Your physician recommends that you continue on your current medications as directed. Please refer to the Current Medication list given to you today.  Dr Royann Shiversroitoru recommends that you schedule a follow-up appointment in 1 year. You will receive a reminder letter in the mail two months in  advance. If you don't receive a letter, please call our office to schedule the follow-up appointment.  If you need a refill on your cardiac medications before your next appointment, please call your pharmacy.    Joie Bimler, MD  07/01/2015 1:35 PM    [facility] [address] [place], [place], [place]  [address] Phone: [phone]; Fax: [phone]

## 2015-07-08 NOTE — Addendum Note (Signed)
Addended by: Evans LanceSTOVER, Clairessa Boulet W on: 07/08/2015 03:33 PM   Modules accepted: Orders

## 2015-11-22 ENCOUNTER — Ambulatory Visit: Payer: Medicare Other | Admitting: Cardiovascular Disease

## 2015-11-29 ENCOUNTER — Other Ambulatory Visit: Payer: Self-pay | Admitting: Cardiovascular Disease

## 2015-11-29 ENCOUNTER — Encounter: Payer: Self-pay | Admitting: Cardiovascular Disease

## 2015-11-29 ENCOUNTER — Ambulatory Visit (INDEPENDENT_AMBULATORY_CARE_PROVIDER_SITE_OTHER): Payer: Medicare Other | Admitting: Cardiovascular Disease

## 2015-11-29 VITALS — BP 147/71 | Ht 64.0 in | Wt 120.0 lb

## 2015-11-29 DIAGNOSIS — E7800 Pure hypercholesterolemia, unspecified: Secondary | ICD-10-CM | POA: Diagnosis not present

## 2015-11-29 DIAGNOSIS — I10 Essential (primary) hypertension: Secondary | ICD-10-CM | POA: Diagnosis not present

## 2015-11-29 DIAGNOSIS — R0789 Other chest pain: Secondary | ICD-10-CM

## 2015-11-29 DIAGNOSIS — F419 Anxiety disorder, unspecified: Secondary | ICD-10-CM | POA: Diagnosis not present

## 2015-11-29 DIAGNOSIS — R079 Chest pain, unspecified: Secondary | ICD-10-CM

## 2015-11-29 DIAGNOSIS — R0989 Other specified symptoms and signs involving the circulatory and respiratory systems: Secondary | ICD-10-CM

## 2015-11-29 MED ORDER — LISINOPRIL 30 MG PO TABS
15.0000 mg | ORAL_TABLET | Freq: Two times a day (BID) | ORAL | 11 refills | Status: DC
Start: 2015-11-29 — End: 2015-11-29

## 2015-11-29 NOTE — Progress Notes (Signed)
Cardiology Office Note    Date:  11/30/2015   ID:  Leah Pittman, DOB [birth year], MRN [id number]  PCP:  Blane Ohara, MD  Cardiologist:   Thurmon Fair, MD   Chief Complaint  Patient presents with  . Follow-up    pt c/o chest pain and stomach pain and dizziness and cramps in her neck on left side  and leg cramps and couging     History of Present Illness:  Leah Pittman is a 82 y.o. female with numerous somatic complaints, among them chest pain. This is variable in location and intensity and always occurs at rest. It is sometimes caused by eating or swallowing pills. At other times it is associated with changes in positions or lifting her puppy. It is not associated with other physical activity. She also describes headaches and neck aches and she reports worsening vision due to glaucoma.  She has overwhelming fatigue and dyspnea, again occurring at rest, unpredictably, but frequently. Her BP is volatile. Occasionally it is low (lowest SBP 89), more often high (often in 150 range, occasionally 200).  Her PCO has Commended that she take statins for hypercholesterolemia. Leah Pittman is not sure she wants to do that. Her total cholesterol is 227, LDL is 143. She does not have known coronary vascular disease.  In 2013 renal artery duplex did not show evidence of renal artery stenosis. Echocardiogram showed normal left ventricular systolic function, there was mild (grade 1) diastolic dysfunction, without elevation in filling pressures. A nuclear stress test in 2015 was a low risk study with a mid anterior wall mild defect consistent with shifting breast attenuation artifact. She reports having a hard time completing the treadmill stress test and states that previous chemical stress tests or worse.  Past Medical History:  Diagnosis Date  . Anxiety   . Anxiety   . Arthritis   . Chest pain    CARDIOLITE - no significant symptoms, EKG changes, or arrhythmias  . Dyspnea 02/16/2012   2D  ECHO - EF 55-60%, normal  . Fatigue   . Glaucoma   . Headache(784.0)   . Hiatal hernia   . High blood pressure 02/16/2012   RENAL DOPPLER - normal  . Hypothyroidism   . Labile hypertension   . Lightheadedness   . Migraine headache   . Multiple allergies   . Myalgia   . Pain, lower leg    Calf  . Problem of menstruation   . Reflux   . SOB (shortness of breath)   . Thyroid disease   . Urinary problem     Past Surgical History:  Procedure Laterality Date  . ABDOMINAL HYSTERECTOMY  1976  . CHOLECYSTECTOMY  2000  . CYSTECTOMY  1974   Intestine  . CYSTECTOMY  1996   Brain stem  . EYE SURGERY  2003    Current Medications: Outpatient Medications Prior to Visit  Medication Sig Dispense Refill  . Ascorbic Acid (VITAMIN C) 1000 MG tablet Take 1,000 mg by mouth daily.    . calcium carbonate (CALCIUM 600) 600 MG TABS tablet Take 600 mg by mouth daily.    . cloNIDine (CATAPRES) 0.1 MG tablet Take 0.1 mg by mouth daily.    . dorzolamide-timolol (COSOPT) 22.3-6.8 MG/ML ophthalmic solution     . levothyroxine (SYNTHROID, LEVOTHROID) 75 MCG tablet Take 88 mcg by mouth daily.     Marland Kitchen LORazepam (ATIVAN) 1 MG tablet Take 1 mg by mouth 2 (two) times daily.    . Magnesium 250  MG TABS Take 250 mg by mouth daily.    . ondansetron (ZOFRAN) 4 MG tablet Take 4 mg by mouth daily.    . pantoprazole (PROTONIX) 40 MG tablet Take 40 mg by mouth daily.     . Probiotic Product (SOLUBLE FIBER/PROBIOTICS PO) Take by mouth.      Jeananne Rama Sulfate (EYE DROPS A/C OP) Apply to eye.      . Travoprost, BAK Free, (TRAVATAN Z) 0.004 % SOLN ophthalmic solution Place 1 drop into both eyes daily.    Marland Kitchen lisinopril (PRINIVIL,ZESTRIL) 30 MG tablet Take 1 tablet (30 mg total) by mouth every morning. (Patient taking differently: Take 10 mg by mouth every morning. ) 30 tablet 5   No facility-administered medications prior to visit.      Allergies:   Codeine; Meperidine; Promethazine; Propranolol; Shellfish  allergy; Tazarotene; and Iodinated diagnostic agents   Social History   Social History  . Marital status: Married    Spouse name: N/A  . Number of children: N/A  . Years of education: N/A   Occupational History  . Retired    Social History Main Topics  . Smoking status: Never Smoker  . Smokeless tobacco: None  . Alcohol use No  . Drug use: No  . Sexual activity: Not Asked   Other Topics Concern  . None   Social History Narrative  . None     Family History:  The patient's family history includes Asthma in her brother; Cancer in her brother, brother, and sister; Heart attack in her father; Stroke in her father.   ROS:   Please see the history of present illness.    ROS All other systems reviewed and are negative.   PHYSICAL EXAM:   VS:  BP (!) 147/71   Ht 5\' 4"  (1.626 m)   Wt 120 lb (54.4 kg)   SpO2 96%   BMI 20.60 kg/m    GEN: Well nourished, well developed, in no acute distress  HEENT: normal  Neck: no JVD, carotid bruits, or masses Cardiac: RRR; no murmurs, rubs, or gallops,no edema  Respiratory:  clear to auscultation bilaterally, normal work of breathing GI: soft, nontender, nondistended, + BS MS: no deformity or atrophy  Skin: warm and dry, no rash Neuro:  Alert and Oriented x 3, Strength and sensation are intact Psych: euthymic mood, full affect  Wt Readings from Last 3 Encounters:  11/29/15 120 lb (54.4 kg)  06/30/15 123 lb (55.8 kg)  01/11/15 120 lb (54.4 kg)      Studies/Labs Reviewed:   EKG:  EKG is ordered today.  The ekg ordered today demonstrates NSR, normal ecg QTC 414 ms.  Recent Labs: Hemoglobin 12.9, creatinine 0.7, potassium 4.5, normal liver function tests  Lipid Panel Total cholesterol 227, triglycerides 87, HDL 67, LDL 143   ASSESSMENT:    1. Chest pain of uncertain etiology   2. Labile hypertension   3. Hypercholesterolemia   4. Anxiety      PLAN:  In order of problems listed above:  1. Chest pain: Sounds decidedly  noncardiac in etiology. Most of her symptoms are musculoskeletal. Some sound GI in etiology. None of them sound like angina. Previous evaluation with a nuclear scan has been low risk. 2. Dyspnea: No clinical findings to suggest hypervolemia. Repeat echo to assess diastolic function to see if there is any evidence for elevated filling pressures. At this point I don't see benefit in empirical use of diuretics, but may try if there is convincing  evidence of gray to her high diastolic dysfunction. 3. HTN: Some variability in blood pressure may be related to anxiety when she checks her blood pressure. Recommended that she split her lisinopril into 2 equal daily doses. Maybe this will eliminate some of the variability. 4. HLP: I worry that if she starts to we will never be able to distinguish true side effects from her current frequent and multiple musculoskeletal complaints    Medication Adjustments/Labs and Tests Ordered: Current medicines are reviewed at length with the patient today.  Concerns regarding medicines are outlined above.  Medication changes, Labs and Tests ordered today are listed in the Patient Instructions below. Patient Instructions  Medication Instructions: Dr Royann Shiversroitoru has recommended making the following medication changes: 1. CHANGE the way you take your LISINOPRIL - take 0.5 tablet (15 mg total) by mouth TWICE daily  Labwork: NONE ORDERED  Testing/Procedures: 1. Echocardiogram - Your physician has requested that you have an echocardiogram. Echocardiography is a painless test that uses sound waves to create images of your heart. It provides your doctor with information about the size and shape of your heart and how well your heart's chambers and valves are working. This procedure takes approximately one hour. There are no restrictions for this procedure. [facility] performed at [facility] location - [address], [address].  Follow-up: Dr Royann Shiversroitoru recommends that you  schedule a follow-up appointment in 1 year. You will receive a reminder letter in the mail two months in advance. If you don't receive a letter, please call our office to schedule the follow-up appointment.  If you need a refill on your cardiac medications before your next appointment, please call your pharmacy.    Signed, Thurmon FairMihai Rasha Ibe, MD  11/30/2015 4:10 PM    [facility] [address], [place], [place]  [address] Phone: [phone]; Fax: [phone]

## 2015-11-29 NOTE — Patient Instructions (Addendum)
Medication Instructions: Dr Royann Shiversroitoru has recommended making the following medication changes: 1. CHANGE the way you take your LISINOPRIL - take 0.5 tablet (15 mg total) by mouth TWICE daily  Labwork: NONE ORDERED  Testing/Procedures: 1. Echocardiogram - Your physician has requested that you have an echocardiogram. Echocardiography is a painless test that uses sound waves to create images of your heart. It provides your doctor with information about the size and shape of your heart and how well your heart's chambers and valves are working. This procedure takes approximately one hour. There are no restrictions for this procedure. This [facility] be performed at our [facility] location - [address], [address].  Follow-up: Dr Royann Shiversroitoru recommends that you schedule a follow-up appointment in 1 year. You will receive a reminder letter in the mail two months in advance. If you don't receive a letter, please call our office to schedule the follow-up appointment.  If you need a refill on your cardiac medications before your next appointment, please call your pharmacy.

## 2015-11-30 DIAGNOSIS — E7800 Pure hypercholesterolemia, unspecified: Secondary | ICD-10-CM | POA: Insufficient documentation

## 2015-12-16 ENCOUNTER — Other Ambulatory Visit (HOSPITAL_COMMUNITY): Payer: Medicare Other

## 2015-12-23 ENCOUNTER — Ambulatory Visit (HOSPITAL_COMMUNITY): Payer: Medicare Other | Attending: Cardiology

## 2015-12-23 ENCOUNTER — Other Ambulatory Visit: Payer: Self-pay

## 2015-12-23 DIAGNOSIS — R0789 Other chest pain: Secondary | ICD-10-CM | POA: Insufficient documentation

## 2015-12-23 DIAGNOSIS — R079 Chest pain, unspecified: Secondary | ICD-10-CM

## 2015-12-23 LAB — ECHOCARDIOGRAM COMPLETE
AVLVOTPG: 3 mmHg
CHL CUP DOP CALC LVOT VTI: 21.3 cm
CHL CUP MV DEC (S): 236
CHL CUP TV REG PEAK VELOCITY: 214 cm/s
E decel time: 236 msec
E/e' ratio: 10.5
FS: 32 % (ref 28–44)
IV/PV OW: 0.93
LA ID, A-P, ES: 32 mm
LA diam index: 2.04 cm/m2
LA vol index: 8.9 mL/m2
LA vol: 14 mL
LAVOLA4C: 10 mL
LDCA: 3.14 cm2
LEFT ATRIUM END SYS DIAM: 32 mm
LV E/e' medial: 10.5
LV E/e'average: 10.5
LV PW d: 8.11 mm — AB (ref 0.6–1.1)
LVELAT: 7.24 cm/s
LVOT SV: 67 mL
LVOT peak vel: 92.5 cm/s
LVOTD: 20 mm
MV Peak grad: 2 mmHg
MVPKAVEL: 111 m/s
MVPKEVEL: 76 m/s
RV LATERAL S' VELOCITY: 12.3 cm/s
RV sys press: 21 mmHg
TDI e' lateral: 7.24
TDI e' medial: 5.59
TRMAXVEL: 214 cm/s

## 2015-12-29 ENCOUNTER — Telehealth: Payer: Self-pay | Admitting: Cardiovascular Disease

## 2015-12-29 NOTE — Telephone Encounter (Signed)
New messasge    Pt is returning call  Message did not come through  Please call back

## 2015-12-29 NOTE — Telephone Encounter (Signed)
Per result note: Echo looks really good, essentially a normal study, relayed to pt. Pt is asking if this will be sent to Dr Mickey FarberKristen [name] her referring provider.

## 2016-06-23 DIAGNOSIS — H2512 Age-related nuclear cataract, left eye: Secondary | ICD-10-CM

## 2016-06-23 HISTORY — DX: Age-related nuclear cataract, left eye: H25.12

## 2016-07-21 HISTORY — PX: EYE SURGERY: SHX253

## 2016-11-01 ENCOUNTER — Other Ambulatory Visit (HOSPITAL_COMMUNITY): Payer: Self-pay | Admitting: Family Medicine

## 2016-11-01 DIAGNOSIS — M542 Cervicalgia: Secondary | ICD-10-CM

## 2016-11-01 DIAGNOSIS — R51 Headache: Principal | ICD-10-CM

## 2016-11-01 DIAGNOSIS — R519 Headache, unspecified: Secondary | ICD-10-CM

## 2016-11-01 DIAGNOSIS — R42 Dizziness and giddiness: Secondary | ICD-10-CM

## 2016-11-06 ENCOUNTER — Ambulatory Visit (HOSPITAL_COMMUNITY)
Admission: RE | Admit: 2016-11-06 | Discharge: 2016-11-06 | Disposition: A | Discharge status: Home / Self Care | Payer: Medicare Other | Source: Ambulatory Visit | Attending: Family Medicine | Admitting: Family Medicine

## 2016-11-06 ENCOUNTER — Ambulatory Visit (HOSPITAL_COMMUNITY): Payer: Medicare Other

## 2016-11-14 ENCOUNTER — Ambulatory Visit (HOSPITAL_COMMUNITY): Payer: Medicare Other

## 2016-11-14 ENCOUNTER — Ambulatory Visit (HOSPITAL_COMMUNITY)
Admission: RE | Admit: 2016-11-14 | Discharge: 2016-11-14 | Disposition: A | Discharge status: Home / Self Care | Payer: Medicare Other | Source: Ambulatory Visit | Attending: Family Medicine | Admitting: Family Medicine

## 2016-11-14 ENCOUNTER — Encounter (HOSPITAL_COMMUNITY): Payer: Self-pay | Admitting: Radiology

## 2016-11-14 DIAGNOSIS — R42 Dizziness and giddiness: Secondary | ICD-10-CM

## 2016-11-14 DIAGNOSIS — E049 Nontoxic goiter, unspecified: Secondary | ICD-10-CM | POA: Insufficient documentation

## 2016-11-14 DIAGNOSIS — M542 Cervicalgia: Secondary | ICD-10-CM

## 2016-11-14 DIAGNOSIS — R51 Headache: Secondary | ICD-10-CM | POA: Insufficient documentation

## 2016-11-14 DIAGNOSIS — R519 Headache, unspecified: Secondary | ICD-10-CM

## 2016-12-07 HISTORY — PX: ESOPHAGOGASTRODUODENOSCOPY: SHX1529

## 2017-01-04 ENCOUNTER — Encounter: Payer: Self-pay | Admitting: Cardiovascular Disease

## 2017-01-04 ENCOUNTER — Ambulatory Visit (INDEPENDENT_AMBULATORY_CARE_PROVIDER_SITE_OTHER): Payer: Medicare Other | Admitting: Cardiovascular Disease

## 2017-01-04 VITALS — BP 154/67 | HR 66 | Ht 64.0 in | Wt 122.6 lb

## 2017-01-04 DIAGNOSIS — E7800 Pure hypercholesterolemia, unspecified: Secondary | ICD-10-CM

## 2017-01-04 DIAGNOSIS — R0989 Other specified symptoms and signs involving the circulatory and respiratory systems: Secondary | ICD-10-CM | POA: Diagnosis not present

## 2017-01-04 DIAGNOSIS — R0789 Other chest pain: Secondary | ICD-10-CM | POA: Diagnosis not present

## 2017-01-04 NOTE — Progress Notes (Signed)
Cardiology Office Note    Date:  01/04/2017   ID:  TRENNA KIELY, DOB 11-24-1930, MRN [id number]  PCP:  Blane Ohara, MD  Cardiologist:   Thurmon Fair, MD   Chief Complaint  Patient presents with  . Follow-up  . Shortness of Breath    occasionally.  . Dizziness    when bending down. ]    History of Present Illness:  Leah Pittman is a 83 y.o. female with HTN and atypical chest discomfort returning for routine follow-up.  She has not had any cardiac or vascular problems since her last appointment.  She states that her glaucoma is worse.  As before she describes a variety of different types of pain in her thoracic and neck area.  She describes a tightness in her bra area that is worse when she lies down.  She describes tightness in her neck muscles that becomes painful and is worsened by turning her head from side to side, which also triggers vertigo.  She had some issues swallowing and reports having her esophagus stretched.  Her blood pressure was high before that procedure.  She has switch from omeprazole to pantoprazole.  She remains preoccupied with her blood pressure.  She has overwhelming fatigue and dyspnea, again occurring at rest, unpredictably, but frequently. Her BP is volatile. Occasionally it is low (lowest SBP 89), more often high (often in 150 range, occasionally 200). Despite admonitions from myself and her primary care doctor, she is continuing to take occasional clonidine for spikes in her blood pressure.  At times this does make her blood pressure dropped too low.  She estimates that she takes it no more than once every 3 weeks.    She is taking a low-dose of simvastatin.  I do not have her most recent lipid profile but she believes that the labs were acceptable when checked earlier this year.  In 2013 renal artery duplex did not show evidence of renal artery stenosis. Echocardiogram showed normal left ventricular systolic function, there was mild (grade 1)  diastolic dysfunction, without elevation in filling pressures. A nuclear stress test in 2015 was a low risk study with a mid anterior wall mild defect consistent with shifting breast attenuation artifact. She reports having a hard time completing the treadmill stress test and states that previous chemical stress tests were worse.  Past Medical History:  Diagnosis Date  . Anxiety   . Anxiety   . Arthritis   . Chest pain    CARDIOLITE - no significant symptoms, EKG changes, or arrhythmias  . Dyspnea 02/16/2012   2D ECHO - EF 55-60%, normal  . Fatigue   . Glaucoma   . Headache(784.0)   . Hiatal hernia   . High blood pressure 02/16/2012   RENAL DOPPLER - normal  . Hypothyroidism   . Labile hypertension   . Lightheadedness   . Migraine headache   . Multiple allergies   . Myalgia   . Pain, lower leg    Calf  . Problem of menstruation   . Reflux   . SOB (shortness of breath)   . Thyroid disease   . Urinary problem     Past Surgical History:  Procedure Laterality Date  . ABDOMINAL HYSTERECTOMY  1976  . CHOLECYSTECTOMY  2000  . CYSTECTOMY  1974   Intestine  . CYSTECTOMY  1996   Brain stem  . EYE SURGERY  2003    Current Medications: Outpatient Medications Prior to Visit  Medication Sig Dispense Refill  .  acetaminophen (TYLENOL) 500 MG tablet Take 500 mg by mouth.    Marland Kitchen. ammonium lactate (AMLACTIN) 12 % cream APPLY TO ARMS AND LEGS BID  0  . Ascorbic Acid (VITAMIN C) 1000 MG tablet Take 1,000 mg by mouth daily.    . calcium carbonate (CALCIUM 600) 600 MG TABS tablet Take 600 mg by mouth daily.    . Cholecalciferol (VITAMIN D3) 1000 units CAPS daily.    . cloNIDine (CATAPRES) 0.1 MG tablet Take 0.1 mg by mouth daily.    . cyanocobalamin 100 MCG tablet 1,000 mcg daily.    . dorzolamide-timolol (COSOPT) 22.3-6.8 MG/ML ophthalmic solution     . levothyroxine (SYNTHROID, LEVOTHROID) 75 MCG tablet Take 88 mcg by mouth daily.     Marland Kitchen. lisinopril (PRINIVIL,ZESTRIL) 5 MG tablet TK 1 T  PO BID  0  . LORazepam (ATIVAN) 1 MG tablet Take 1 mg by mouth 2 (two) times daily.    . Magnesium 250 MG TABS Take 250 mg by mouth daily.    Marland Kitchen. omeprazole (PRILOSEC) 40 MG capsule Take 40 mg by mouth.    . ondansetron (ZOFRAN) 4 MG tablet Take 4 mg by mouth daily.    . pantoprazole (PROTONIX) 40 MG tablet Take 40 mg by mouth daily.     . Probiotic Product (SOLUBLE FIBER/PROBIOTICS PO) Take by mouth.      . simvastatin (ZOCOR) 10 MG tablet 10 mg.    . Tetrahydrozoline-Zn Sulfate (EYE DROPS A/C OP) Apply to eye.      Marland Kitchen. TIMOLOL HEMIHYDRATE OP     . Travoprost, BAK Free, (TRAVATAN Z) 0.004 % SOLN ophthalmic solution Place 1 drop into both eyes daily.    Marland Kitchen. lisinopril (PRINIVIL,ZESTRIL) 30 MG tablet TAKE 1/2 TABLET BY MOUTH 2 TIMES DAILY 90 tablet 3   No facility-administered medications prior to visit.      Allergies:   Codeine; Meperidine; Promethazine; Propranolol; Shellfish allergy; Tazarotene; and Iodinated diagnostic agents   Social History   Socioeconomic History  . Marital status: Married    Spouse name: None  . Number of children: None  . Years of education: None  . Highest education level: None  Social Needs  . Financial resource strain: None  . Food insecurity - worry: None  . Food insecurity - inability: None  . Transportation needs - medical: None  . Transportation needs - non-medical: None  Occupational History  . Occupation: Retired  Tobacco Use  . Smoking status: Never Smoker  . Smokeless tobacco: Never Used  Substance and Sexual Activity  . Alcohol use: No  . Drug use: No  . Sexual activity: None  Other Topics Concern  . None  Social History Narrative  . None     Family History:  The patient's family history includes Asthma in her brother; Cancer in her brother, brother, and sister; Heart attack in her father; Stroke in her father.   ROS:   Please see the history of present illness.    ROS All other systems reviewed and are negative.   PHYSICAL EXAM:     VS:  BP (!) 154/67   Pulse 66   Ht 5\' 4"  (1.626 m)   Wt 122 lb 9.6 oz (55.6 kg)   BMI 21.04 kg/m    GEN: Well nourished, well developed, in no acute distress  HEENT: normal  Neck: no JVD, carotid bruits, or masses Cardiac: RRR; no murmurs, rubs, or gallops,no edema  Respiratory:  clear to auscultation bilaterally, normal work of breathing GI:  soft, nontender, nondistended, + BS MS: no deformity or atrophy  Skin: warm and dry, no rash Neuro:  Alert and Oriented x 3, Strength and sensation are intact Psych: euthymic mood, full affect  Wt Readings from Last 3 Encounters:  01/04/17 122 lb 9.6 oz (55.6 kg)  11/29/15 120 lb (54.4 kg)  06/30/15 123 lb (55.8 kg)      Studies/Labs Reviewed:   EKG:  EKG is ordered today.  The ekg ordered today demonstrates normal sinus rhythm, normal tracing  Recent Labs: Hemoglobin 12.9, creatinine 0.7, potassium 4.5, normal liver function tests  Lipid Panel 2017 Total cholesterol 227, triglycerides 87, HDL 67, LDL 143   ASSESSMENT:    No diagnosis found.   PLAN:  In order of problems listed above:  1. Chest pain: As before, her complaints are noncardiac, most likely musculoskeletal.  I did not recommend any additional cardiac evaluation.. 2. HTN: I do not think she should take any additional blood pressure medicines.  I have asked her to try relaxation techniques before taking any as needed medicines such as clonidine.  We discussed the difference between resting blood pressure and the naturally increased blood pressure that occurs during periods of physical or emotional discomfort. 3. HLP: Now on simvastatin, will try to retrieve her most recent lipid profile from PCP.  I do not think there is any evidence that she has worsened muscular complaints since being on that medicine.  It sounds to me like she is having the same level of issues.    Medication Adjustments/Labs and Tests Ordered: Current medicines are reviewed at length with the  patient today.  Concerns regarding medicines are outlined above.  Medication changes, Labs and Tests ordered today are listed in the Patient Instructions below. Patient Instructions  Dr Royann Shiversroitoru recommends that you schedule a follow-up appointment in 12 months. You will receive a reminder letter in the mail two months in advance. If you don't receive a letter, please call our office to schedule the follow-up appointment.  If you need a refill on your cardiac medications before your next appointment, please call your pharmacy.    Signed, Thurmon FairMihai Terrick Allred, MD  01/04/2017 10:46 AM    [facility] [address], [place], [place]  [address] Phone: [phone]([phone]; Fax: [phone]

## 2017-01-04 NOTE — Patient Instructions (Signed)
Dr Croitoru recommends that you schedule a follow-up appointment in 12 months. You will receive a reminder letter in the mail two months in advance. If you don't receive a letter, please call our office to schedule the follow-up appointment.  If you need a refill on your cardiac medications before your next appointment, please call your pharmacy. 

## 2017-07-13 DIAGNOSIS — R1013 Epigastric pain: Secondary | ICD-10-CM

## 2017-07-13 DIAGNOSIS — K56609 Unspecified intestinal obstruction, unspecified as to partial versus complete obstruction: Secondary | ICD-10-CM

## 2017-07-13 DIAGNOSIS — K5792 Diverticulitis of intestine, part unspecified, without perforation or abscess without bleeding: Secondary | ICD-10-CM

## 2017-07-14 DIAGNOSIS — K56609 Unspecified intestinal obstruction, unspecified as to partial versus complete obstruction: Secondary | ICD-10-CM | POA: Diagnosis not present

## 2017-07-14 DIAGNOSIS — K5792 Diverticulitis of intestine, part unspecified, without perforation or abscess without bleeding: Secondary | ICD-10-CM | POA: Diagnosis not present

## 2017-07-14 DIAGNOSIS — R1013 Epigastric pain: Secondary | ICD-10-CM | POA: Diagnosis not present

## 2017-07-15 DIAGNOSIS — K56609 Unspecified intestinal obstruction, unspecified as to partial versus complete obstruction: Secondary | ICD-10-CM | POA: Diagnosis not present

## 2017-07-15 DIAGNOSIS — K5792 Diverticulitis of intestine, part unspecified, without perforation or abscess without bleeding: Secondary | ICD-10-CM | POA: Diagnosis not present

## 2017-07-15 DIAGNOSIS — R1013 Epigastric pain: Secondary | ICD-10-CM | POA: Diagnosis not present

## 2017-07-16 DIAGNOSIS — R1013 Epigastric pain: Secondary | ICD-10-CM | POA: Diagnosis not present

## 2017-07-16 DIAGNOSIS — K5792 Diverticulitis of intestine, part unspecified, without perforation or abscess without bleeding: Secondary | ICD-10-CM | POA: Diagnosis not present

## 2017-07-16 DIAGNOSIS — K56609 Unspecified intestinal obstruction, unspecified as to partial versus complete obstruction: Secondary | ICD-10-CM | POA: Diagnosis not present

## 2017-07-17 DIAGNOSIS — R1013 Epigastric pain: Secondary | ICD-10-CM | POA: Diagnosis not present

## 2017-07-17 DIAGNOSIS — K56609 Unspecified intestinal obstruction, unspecified as to partial versus complete obstruction: Secondary | ICD-10-CM | POA: Diagnosis not present

## 2017-07-17 DIAGNOSIS — K5792 Diverticulitis of intestine, part unspecified, without perforation or abscess without bleeding: Secondary | ICD-10-CM | POA: Diagnosis not present

## 2017-07-18 DIAGNOSIS — R1013 Epigastric pain: Secondary | ICD-10-CM | POA: Diagnosis not present

## 2017-07-18 DIAGNOSIS — K56609 Unspecified intestinal obstruction, unspecified as to partial versus complete obstruction: Secondary | ICD-10-CM | POA: Diagnosis not present

## 2017-07-18 DIAGNOSIS — R109 Unspecified abdominal pain: Secondary | ICD-10-CM | POA: Diagnosis not present

## 2017-07-18 DIAGNOSIS — K5792 Diverticulitis of intestine, part unspecified, without perforation or abscess without bleeding: Secondary | ICD-10-CM | POA: Diagnosis not present

## 2017-07-31 DIAGNOSIS — R079 Chest pain, unspecified: Secondary | ICD-10-CM

## 2017-07-31 DIAGNOSIS — R748 Abnormal levels of other serum enzymes: Secondary | ICD-10-CM

## 2017-07-31 DIAGNOSIS — I214 Non-ST elevation (NSTEMI) myocardial infarction: Secondary | ICD-10-CM

## 2017-07-31 DIAGNOSIS — K56609 Unspecified intestinal obstruction, unspecified as to partial versus complete obstruction: Secondary | ICD-10-CM

## 2017-07-31 DIAGNOSIS — R1013 Epigastric pain: Secondary | ICD-10-CM

## 2017-07-31 DIAGNOSIS — R109 Unspecified abdominal pain: Secondary | ICD-10-CM

## 2017-07-31 DIAGNOSIS — I160 Hypertensive urgency: Secondary | ICD-10-CM

## 2017-08-01 DIAGNOSIS — I160 Hypertensive urgency: Secondary | ICD-10-CM | POA: Diagnosis not present

## 2017-08-01 DIAGNOSIS — R079 Chest pain, unspecified: Secondary | ICD-10-CM | POA: Diagnosis not present

## 2017-08-01 DIAGNOSIS — R748 Abnormal levels of other serum enzymes: Secondary | ICD-10-CM | POA: Diagnosis not present

## 2017-08-01 DIAGNOSIS — R109 Unspecified abdominal pain: Secondary | ICD-10-CM | POA: Diagnosis not present

## 2017-08-02 DIAGNOSIS — R079 Chest pain, unspecified: Secondary | ICD-10-CM | POA: Diagnosis not present

## 2017-08-02 DIAGNOSIS — R748 Abnormal levels of other serum enzymes: Secondary | ICD-10-CM | POA: Diagnosis not present

## 2017-08-02 DIAGNOSIS — I160 Hypertensive urgency: Secondary | ICD-10-CM | POA: Diagnosis not present

## 2017-08-02 DIAGNOSIS — R109 Unspecified abdominal pain: Secondary | ICD-10-CM | POA: Diagnosis not present

## 2017-08-13 ENCOUNTER — Ambulatory Visit (INDEPENDENT_AMBULATORY_CARE_PROVIDER_SITE_OTHER): Payer: Medicare Other | Admitting: Cardiovascular Disease

## 2017-08-13 ENCOUNTER — Encounter: Payer: Self-pay | Admitting: Cardiovascular Disease

## 2017-08-13 VITALS — BP 142/68 | HR 63 | Ht 64.0 in | Wt 111.0 lb

## 2017-08-13 DIAGNOSIS — I10 Essential (primary) hypertension: Secondary | ICD-10-CM

## 2017-08-13 DIAGNOSIS — R072 Precordial pain: Secondary | ICD-10-CM | POA: Diagnosis not present

## 2017-08-13 DIAGNOSIS — E7800 Pure hypercholesterolemia, unspecified: Secondary | ICD-10-CM

## 2017-08-13 NOTE — Patient Instructions (Signed)
Medication Instructions: Dr Royann Shiversroitoru recommends that you continue on your current medications as directed. Please refer to the Current Medication list given to you today.  Labwork: NONE ORDERED  Testing/Procedures: 1. Coronary CT - Your physician has requested that you have cardiac CT. Cardiac computed tomography (CT) is a painless test that uses an x-ray machine to take clear, detailed pictures of your heart. For further information please visit [email or web address]. Please follow instructions listed below.  Follow-up: Dr Royann Shiversroitoru recommends that you schedule a follow-up appointment in 12 months. You will receive a reminder letter in the mail two months in advance. If you don't receive a letter, please call our office to schedule the follow-up appointment.  If you need a refill on your cardiac medications before your next appointment, please call your pharmacy.    Coronary CT Instructions  Please arrive [facility] the [facility] at _______ AM (30-45 minutes prior to test start time)  [facility] [address] [place], [place] [address] [phone]  Proceed to the [facility] Radiology Department (First Floor).  Please follow these instructions carefully (unless otherwise directed):  On the Night Before the Test: . Drink plenty of water. . Do not consume any caffeinated/decaffeinated beverages or chocolate 12 hours prior to your test.  On the Day of the Test: . Drink plenty of water. Do not drink any water within one hour of the test. . Do not eat any food 4 hours prior to the test. . You may take your regular medications prior to the test. . Please take your Carvedilol as prescribed.  After the Test: . Drink plenty of water. . After receiving IV contrast, you may experience a mild flushed feeling. This is normal. . On occasion, you may experience a mild rash up to 24 hours after the test. This is not dangerous. If this occurs,  you can take Benadryl 25 mg and increase your fluid intake. . If you experience trouble breathing, this can be serious. If it is severe call 911 IMMEDIATELY. If it is mild, please call our office. . If you take any of these medications: Glipizide/Metformin, Avandament, Glucavance, please do not take 48 hours after completing test.

## 2017-08-13 NOTE — Progress Notes (Signed)
Cardiology Office Note    Date:  08/14/2017   ID:  Leah MillinMary E Pittman, DOB 01/08/1931, MRN [id number]  PCP:  Blane Oharaox, [name], MD  Cardiologist:   Thurmon FairMihai Ebunoluwa Gernert, MD   Chief Complaint  Patient presents with  . Follow-up    4 months    History of Present Illness:  Leah Pittman is a 84 y.o. female with HTN and atypical chest discomfort returning for routine follow-up.  She has been hospitalized 3 times since I last saw her for diverticulitis and subsequently for influenza that was then complicated by severe pneumonia.  About 10 days ago she was hospitalized with chest pain and aspirin.  She has been having right-sided chest discomfort for a few days, the symptoms intensified when her grandson overdosed on opiates and eventually died.  She tells me she was hospitalized in [place] for about 3 days. I do not yet have those records.  She continues to have chest pain.  She was prescribed carvedilol at hospital discharge.  She had a normal nuclear stress test in 2015.  Her blood pressure has been, as always, highly variable but more frequently on the low side.  She does not take her lisinopril until she checks her blood pressure in the morning.  She is worried about continuing to take the carvedilol.  She reports that at home today her blood pressure was 126/50 before she came in.  Her lipid profile checked just a few days ago showed an LDL cholesterol 142 (total cholesterol 220, HDL 62, triglycerides 79).  All other labs are normal including a creatinine of 0.76 hemoglobin A1c of 5.4%, normal liver function tests  In 2013 renal artery duplex did not show evidence of renal artery stenosis. Echocardiogram showed normal left ventricular systolic function, there was mild (grade 1) diastolic dysfunction, without elevation in filling pressures. A nuclear stress test in 2015 was a low risk study with a mid anterior wall mild defect consistent with shifting breast attenuation artifact. She reports having a  hard time completing the treadmill stress test and states that previous chemical stress tests were worse.  Past Medical History:  Diagnosis Date  . Anxiety   . Anxiety   . Arthritis   . Chest pain    CARDIOLITE - no significant symptoms, EKG changes, or arrhythmias  . Dyspnea 02/16/2012   2D ECHO - EF 55-60%, normal  . Fatigue   . Glaucoma   . Headache(784.0)   . Hiatal hernia   . High blood pressure 02/16/2012   RENAL DOPPLER - normal  . Hypothyroidism   . Labile hypertension   . Lightheadedness   . Migraine headache   . Multiple allergies   . Myalgia   . Pain, lower leg    Calf  . Problem of menstruation   . Reflux   . SOB (shortness of breath)   . Thyroid disease   . Urinary problem     Past Surgical History:  Procedure Laterality Date  . ABDOMINAL HYSTERECTOMY  1976  . CHOLECYSTECTOMY  2000  . CYSTECTOMY  1974   Intestine  . CYSTECTOMY  1996   Brain stem  . EYE SURGERY  2003    Current Medications: Outpatient Medications Prior to Visit  Medication Sig Dispense Refill  . acetaminophen (TYLENOL) 500 MG tablet Take 500 mg by mouth.    . Ascorbic Acid (VITAMIN C) 1000 MG tablet Take 1,000 mg by mouth daily.    Marland Kitchen. aspirin 81 MG tablet Take 81 mg by  mouth daily.    . calcium carbonate (CALCIUM 600) 600 MG TABS tablet Take 600 mg by mouth daily.    . carvedilol (COREG) 3.125 MG tablet Take 3.125 mg by mouth 2 (two) times daily with a meal.    . Cholecalciferol (VITAMIN D3) 1000 units CAPS daily.    . cloNIDine (CATAPRES) 0.1 MG tablet Take 0.1 mg by mouth daily.    . cyanocobalamin 100 MCG tablet 1,000 mcg daily.    . dorzolamide-timolol (COSOPT) 22.3-6.8 MG/ML ophthalmic solution     . levothyroxine (SYNTHROID, LEVOTHROID) 75 MCG tablet Take 88 mcg by mouth daily.     Marland Kitchen lisinopril (PRINIVIL,ZESTRIL) 5 MG tablet TK 1 T PO BID  0  . LORazepam (ATIVAN) 1 MG tablet Take 1 mg by mouth 2 (two) times daily.    . Magnesium 250 MG TABS Take 250 mg by mouth daily.    Marland Kitchen  omeprazole (PRILOSEC) 40 MG capsule Take 40 mg by mouth.    . ondansetron (ZOFRAN) 4 MG tablet Take 4 mg by mouth daily.    . pantoprazole (PROTONIX) 40 MG tablet Take 40 mg by mouth daily.     . Probiotic Product (SOLUBLE FIBER/PROBIOTICS PO) Take by mouth.      . SENNA CO by Combination route.    . simvastatin (ZOCOR) 10 MG tablet 10 mg.    . Tetrahydrozoline-Zn Sulfate (EYE DROPS A/C OP) Apply to eye.      Marland Kitchen TIMOLOL HEMIHYDRATE OP     . Travoprost, BAK Free, (TRAVATAN Z) 0.004 % SOLN ophthalmic solution Place 1 drop into both eyes daily.    Marland Kitchen ammonium lactate (AMLACTIN) 12 % cream APPLY TO ARMS AND LEGS BID  0   No facility-administered medications prior to visit.      Allergies:   Codeine; Meperidine; Promethazine; Propranolol; Shellfish allergy; Tazarotene; and Iodinated diagnostic agents   Social History   Socioeconomic History  . Marital status: Married    Spouse name: Not on file  . Number of children: Not on file  . Years of education: Not on file  . Highest education level: Not on file  Occupational History  . Occupation: Retired  Engineer, production  . Financial resource strain: Not on file  . Food insecurity:    Worry: Not on file    Inability: Not on file  . Transportation needs:    Medical: Not on file    Non-medical: Not on file  Tobacco Use  . Smoking status: Never Smoker  . Smokeless tobacco: Never Used  Substance and Sexual Activity  . Alcohol use: No  . Drug use: No  . Sexual activity: Not on file  Lifestyle  . Physical activity:    Days per week: Not on file    Minutes per session: Not on file  . Stress: Not on file  Relationships  . Social connections:    Talks on phone: Not on file    Gets together: Not on file    Attends religious service: Not on file    Active member of club or organization: Not on file    Attends meetings of clubs or organizations: Not on file    Relationship status: Not on file  Other Topics Concern  . Not on file  Social  History Narrative  . Not on file     Family History:  The patient's family history includes Asthma in her brother; Cancer in her brother, brother, and sister; Heart attack in her father; Stroke in  her father.   ROS:   Please see the history of present illness.    ROS All other systems reviewed and are negative.   PHYSICAL EXAM:   VS:  BP (!) 142/68 (BP Location: Left Arm, Patient Position: Sitting, Cuff Size: Normal)   Pulse 63   Ht 5\' 4"  (1.626 m)   Wt 111 lb (50.3 kg)   BMI 19.05 kg/m     General: Alert, oriented x3, no distress, lean Head: no evidence of trauma, PERRL, EOMI, no exophtalmos or lid lag, no myxedema, no xanthelasma; normal ears, nose and oropharynx Neck: normal jugular venous pulsations and no hepatojugular reflux; brisk carotid pulses without delay and no carotid bruits Chest: clear to auscultation, no signs of consolidation by percussion or palpation, normal fremitus, symmetrical and full respiratory excursions Cardiovascular: normal position and quality of the apical impulse, regular rhythm, normal first and second heart sounds, no murmurs, rubs or gallops Abdomen: no tenderness or distention, no masses by palpation, no abnormal pulsatility or arterial bruits, normal bowel sounds, no hepatosplenomegaly Extremities: no clubbing, cyanosis or edema; 2+ radial, ulnar and brachial pulses bilaterally; 2+ right femoral, posterior tibial and dorsalis pedis pulses; 2+ left femoral, posterior tibial and dorsalis pedis pulses; no subclavian or femoral bruits Neurological: grossly nonfocal Psych: Normal mood and affect   Wt Readings from Last 3 Encounters:  08/13/17 111 lb (50.3 kg)  01/04/17 122 lb 9.6 oz (55.6 kg)  11/29/15 120 lb (54.4 kg)      Studies/Labs Reviewed:   EKG:  EKG is ordered today.  The ekg ordered today is normal, normal sinus rhythm Recent Labs: 03/29/2017 hemoglobin A1c 5.4%, normal LFTs, mean, creatinine 0.76, potassium 4.7 cholesterol 220, HDL  62, LDL 122, triglycerides 79  ASSESSMENT:    1. Precordial chest pain   2. Essential hypertension   3. Hypercholesterolemia      PLAN:  In order of problems listed above:  1. Chest pain: In the past, felt that her symptoms were more likely musculoskeletal, but her symptoms are quite persistent and were worsened by emotional state.  She did have a questionable anterior defect on her nuclear stress test in 2015, although this was most likely explained by shifting breast attenuation artifact.  To clarify the situation we will schedule her for coronary CT angiogram.  2. HTN:  She should continue taking the carvedilol to keep her heart rate below until that test, but we can discontinue it afterwards if we do not find evidence of CAD. 3. HLP: Her LDL is quite high.  She is on a tiny dose of simvastatin.  She reports problems with other statins in the past.  If we find CAD, can consider the use of PCSK9 inhibitors.  If she does not have any CAD and her calcium score is low, we will continue with a more conservative approach.    Medication Adjustments/Labs and Tests Ordered: Current medicines are reviewed at length with the patient today.  Concerns regarding medicines are outlined above.  Medication changes, Labs and Tests ordered today are listed in the Patient Instructions below. Patient Instructions  Medication Instructions: Dr Royann Shivers recommends that you continue on your current medications as directed. Please refer to the Current Medication list given to you today.  Labwork: NONE ORDERED  Testing/Procedures: 1. Coronary CT - Your physician has requested that you have cardiac CT. Cardiac computed tomography (CT) is a painless test that uses an x-ray machine to take clear, detailed pictures of your heart. For further information  please visit [email or web address] Please follow instructions listed below.  Follow-up: Dr Royann Shivers recommends that you schedule a follow-up appointment in 12  months. You will receive a reminder letter in the mail two months in advance. If you don't receive a letter, please call our office to schedule the follow-up appointment.  If you need a refill on your cardiac medications before your next appointment, please call your pharmacy.    Coronary CT Instructions  Please arrive at the [facility] main entrance of [facility] at _______ AM (30-45 minutes prior to test start time)  [facility] [address] [place], [place] [address] [phone]  Proceed to the [facility] Radiology Department (First Floor).  Please follow these instructions carefully (unless otherwise directed):  On the Night Before the Test: . Drink plenty of water. . Do not consume any caffeinated/decaffeinated beverages or chocolate 12 hours prior to your test.  On the Day of the Test: . Drink plenty of water. Do not drink any water within one hour of the test. . Do not eat any food 4 hours prior to the test. . You may take your regular medications prior to the test. . Please take your Carvedilol as prescribed.  After the Test: . Drink plenty of water. . After receiving IV contrast, you may experience a mild flushed feeling. This is normal. . On occasion, you may experience a mild rash up to 24 hours after the test. This is not dangerous. If this occurs, you can take Benadryl 25 mg and increase your fluid intake. . If you experience trouble breathing, this can be serious. If it is severe call 911 IMMEDIATELY. If it is mild, please call our office. . If you take any of these medications: Glipizide/Metformin, Avandament, Glucavance, please do not take 48 hours after completing test.     Signed, Thurmon Fair, MD  08/14/2017 8:36 AM    [facility] [address] [place], [place], [place]  [address] Phone: [phone]; Fax: [phone]

## 2017-08-14 ENCOUNTER — Encounter: Payer: Self-pay | Admitting: Cardiovascular Disease

## 2017-08-14 DIAGNOSIS — I119 Hypertensive heart disease without heart failure: Secondary | ICD-10-CM | POA: Insufficient documentation

## 2017-08-14 DIAGNOSIS — I10 Essential (primary) hypertension: Secondary | ICD-10-CM | POA: Insufficient documentation

## 2017-08-14 HISTORY — DX: Hypertensive heart disease without heart failure: I11.9

## 2017-09-26 ENCOUNTER — Telehealth: Payer: Self-pay | Admitting: Cardiovascular Disease

## 2017-09-26 DIAGNOSIS — R072 Precordial pain: Secondary | ICD-10-CM

## 2017-09-26 NOTE — Telephone Encounter (Signed)
NEW MESSAGE    Patient husband stopped in the office today to discuss Cardiac Ct , he needs a nurse to call him about instructions   Patient is allergic to contrast dye

## 2017-09-26 NOTE — Telephone Encounter (Signed)
Patient's husband is upset that no one has called regarding the fact his wife is allergic to the contrast for the Cardiac CT----he has also been made aware she needs to have lab work prior to the CT and he wants to know if this can be done at our [place] office.  Please call.

## 2017-09-26 NOTE — Telephone Encounter (Signed)
Patient husband was called, and LVM advising that we would make Dr.Croitoru aware of allergy,but also made patient aware that they would normally give patient medications like prednisone and benadryl before the CT, also advised that the labs could be drawn in [place] as long as it was with [facility] so they could see the lab orders.   Please verify if this is correct, and if you will be sending in that medication for her to take before the CT.   Thank you!

## 2017-09-26 NOTE — Telephone Encounter (Signed)
Pt will need prednisone and benadryl prior to CTA; please contact radiology for their protocol for pretreating dye allergy (timing of meds).  Leah MillersBrian Pittman

## 2017-09-27 ENCOUNTER — Telehealth: Payer: Self-pay | Admitting: Cardiovascular Disease

## 2017-09-27 MED ORDER — PREDNISONE 50 MG PO TABS: ORAL_TABLET | ORAL | 0 refills | Status: DC

## 2017-09-27 NOTE — Telephone Encounter (Signed)
Spoke with pt husband,aware patient to take prednisone 50 mg 13 hours prior, 7 hours prior and 1 hour prior. She will also take benadryl 50 mg 1 hour prior. New script sent to the pharmacy and they will get benadryl OTC. All other questions regarding cardiac CT answered.

## 2017-09-27 NOTE — Telephone Encounter (Signed)
Spoke with radiologists re Benadryl allergy and pt may take Pepcid with the Prednisone instead of the Benadryl . Pt aware and that the Prednisone is the most important med to take with the dye allergy ./cy

## 2017-09-27 NOTE — Telephone Encounter (Signed)
New Message        Patient needs a call back concerning her procedure and to change medication that she is allergic to. (Benadryl).

## 2017-09-27 NOTE — Telephone Encounter (Signed)
Follow up   Please return call to spouse. He is requesting additional instructions for CT

## 2017-09-28 LAB — BASIC METABOLIC PANEL
BUN/Creatinine Ratio: 15 (ref 12–28)
BUN: 13 mg/dL (ref 8–27)
CHLORIDE: 101 mmol/L (ref 96–106)
CO2: 23 mmol/L (ref 20–29)
Calcium: 9.7 mg/dL (ref 8.7–10.3)
Creatinine, Ser: 0.89 mg/dL (ref 0.57–1.00)
GFR calc non Af Amer: 60 mL/min/{1.73_m2} (ref >59)
GFR, EST AFRICAN AMERICAN: 69 mL/min/{1.73_m2} (ref >59)
Glucose: 111 mg/dL — ABNORMAL HIGH (ref 65–99)
POTASSIUM: 4.5 mmol/L (ref 3.5–5.2)
Sodium: 137 mmol/L (ref 134–144)

## 2017-10-03 ENCOUNTER — Ambulatory Visit (HOSPITAL_COMMUNITY): Payer: Medicare Other

## 2018-03-13 ENCOUNTER — Ambulatory Visit: Payer: Medicare Other | Admitting: Cardiovascular Disease

## 2018-03-20 ENCOUNTER — Encounter: Payer: Self-pay | Admitting: Cardiovascular Disease

## 2018-03-20 ENCOUNTER — Ambulatory Visit (INDEPENDENT_AMBULATORY_CARE_PROVIDER_SITE_OTHER): Payer: Medicare Other | Admitting: Cardiovascular Disease

## 2018-03-20 VITALS — BP 142/70 | HR 56 | Ht 64.0 in | Wt 114.0 lb

## 2018-03-20 DIAGNOSIS — R072 Precordial pain: Secondary | ICD-10-CM

## 2018-03-20 DIAGNOSIS — E7800 Pure hypercholesterolemia, unspecified: Secondary | ICD-10-CM

## 2018-03-20 DIAGNOSIS — I10 Essential (primary) hypertension: Secondary | ICD-10-CM

## 2018-03-20 MED ORDER — PREDNISONE 50 MG PO TABS: ORAL_TABLET | ORAL | 0 refills | Status: DC

## 2018-03-20 MED ORDER — HYDROXYZINE HCL 25 MG PO TABS: ORAL_TABLET | ORAL | 0 refills | Status: DC

## 2018-03-20 NOTE — Progress Notes (Signed)
Cardiology Office Note    Date:  03/20/2018   ID:  Leah MillinMary E Cumming, DOB October 29, 1930, MRN [id number]  PCP:  Blane Oharaox, [name], MD  Cardiologist:   Thurmon FairMihai Claire Dolores, MD   Chief Complaint  Patient presents with  . Follow-up    pt c/o chest pain    History of Present Illness:  Leah Pittman is a 85 y.o. female with HTN and severe hypercholesterolemia and atypical chest discomfort returning for routine follow-up.  Jazzalyn did not have the plan coronary CT angiogram.  Her husband Ray Pierre Bali(Thomas Norlander) has been very ill with recurrent hemopericardium and hemorrhagic pleural effusions following his stroke in the summer.  She was not able to come in for her coronary CT angiogram because of that.  She is currently not taking aspirin or carvedilol anymore.  Following an episode of severe flu with pneumonia she had a transient event with chest pain after learning that her grandson had overdosed on opiates and died.  She had a mild elevation in cardiac troponin and was prescribed carvedilol at that time.  Her chest pain remains very variable involving the area of her left breast, upper right chest, midsternal area, generally with a sharp quality and never associated with activity.  In fact she notices most when she is in bed at night.  She also complains of a tension/cramp sensation in her neck when she turns her head or looks up.  She is worried that any 1 of these or all of these may be a sign of vascular problems.  LDL cholesterol was 142 in June 2019.  Otherwise has a favorable lipid profile with an HDL of 62 and triglycerides of 79.  She does not have diabetes mellitus.  In 2013 renal artery duplex did not show evidence of renal artery stenosis. Echocardiogram showed normal left ventricular systolic function, there was mild (grade 1) diastolic dysfunction, without elevation in filling pressures. A nuclear stress test in 2015 was a low risk study with a mid anterior wall mild defect consistent with  shifting breast attenuation artifact. She reports having a hard time completing the treadmill stress test and states that previous chemical stress tests were worse.  She had a questionable contrast reaction in the past, but has had abdominal CTs with contrast more recently without allergic events.  Past Medical History:  Diagnosis Date  . Anxiety   . Anxiety   . Arthritis   . Chest pain    CARDIOLITE - no significant symptoms, EKG changes, or arrhythmias  . Dyspnea 02/16/2012   2D ECHO - EF 55-60%, normal  . Fatigue   . Glaucoma   . Headache(784.0)   . Hiatal hernia   . High blood pressure 02/16/2012   RENAL DOPPLER - normal  . Hypothyroidism   . Labile hypertension   . Lightheadedness   . Migraine headache   . Multiple allergies   . Myalgia   . Pain, lower leg    Calf  . Problem of menstruation   . Reflux   . SOB (shortness of breath)   . Thyroid disease   . Urinary problem     Past Surgical History:  Procedure Laterality Date  . ABDOMINAL HYSTERECTOMY  1976  . CHOLECYSTECTOMY  2000  . CYSTECTOMY  1974   Intestine  . CYSTECTOMY  1996   Brain stem  . EYE SURGERY  2003    Current Medications: Outpatient Medications Prior to Visit  Medication Sig Dispense Refill  . acetaminophen (TYLENOL) 500 MG  tablet Take 500 mg by mouth.    . Ascorbic Acid (VITAMIN C) 1000 MG tablet Take 1,000 mg by mouth daily.    . calcium carbonate (CALCIUM 600) 600 MG TABS tablet Take 600 mg by mouth daily.    . cyanocobalamin 100 MCG tablet 1,000 mcg daily.    Marland Kitchen dicyclomine (BENTYL) 20 MG tablet Take 1 tablet by mouth daily.    . dorzolamide-timolol (COSOPT) 22.3-6.8 MG/ML ophthalmic solution     . levothyroxine (SYNTHROID, LEVOTHROID) 75 MCG tablet Take 88 mcg by mouth daily.     Marland Kitchen lisinopril (PRINIVIL,ZESTRIL) 5 MG tablet Take 5 mg by mouth 2 (two) times daily.   0  . LORazepam (ATIVAN) 1 MG tablet Take 1 mg by mouth 2 (two) times daily.    . Magnesium 250 MG TABS Take 250 mg by mouth  daily.    . ondansetron (ZOFRAN) 4 MG tablet Take 4 mg by mouth daily.    . pantoprazole (PROTONIX) 40 MG tablet Take 40 mg by mouth daily.     . Probiotic Product (SOLUBLE FIBER/PROBIOTICS PO) Take by mouth.      . SENNA CO by Combination route.    . simvastatin (ZOCOR) 10 MG tablet Take 10 mg by mouth daily at 6 PM.     . Tetrahydrozoline-Zn Sulfate (EYE DROPS A/C OP) Apply to eye.      . Travoprost, BAK Free, (TRAVATAN Z) 0.004 % SOLN ophthalmic solution Place 1 drop into both eyes daily.    . Cholecalciferol (VITAMIN D3) 1000 units CAPS daily.    . cloNIDine (CATAPRES) 0.1 MG tablet Take 0.1 mg by mouth daily.    Marland Kitchen omeprazole (PRILOSEC) 40 MG capsule Take 40 mg by mouth.    . predniSONE (DELTASONE) 50 MG tablet Take 1 tablet 13 hours prior to scan, 7 hours prior to scan and 1 hour prior to scan 3 tablet 0  . TIMOLOL HEMIHYDRATE OP     . aspirin 81 MG tablet Take 81 mg by mouth daily.    . carvedilol (COREG) 3.125 MG tablet Take 3.125 mg by mouth 2 (two) times daily with a meal.     No facility-administered medications prior to visit.      Allergies:   Benadryl [diphenhydramine]; Codeine; Meperidine; Promethazine; Propranolol; Shellfish allergy; Tazarotene; and Iodinated diagnostic agents   Social History   Socioeconomic History  . Marital status: Married    Spouse name: Not on file  . Number of children: Not on file  . Years of education: Not on file  . Highest education level: Not on file  Occupational History  . Occupation: Retired  Engineer, production  . Financial resource strain: Not on file  . Food insecurity:    Worry: Not on file    Inability: Not on file  . Transportation needs:    Medical: Not on file    Non-medical: Not on file  Tobacco Use  . Smoking status: Never Smoker  . Smokeless tobacco: Never Used  Substance and Sexual Activity  . Alcohol use: No  . Drug use: No  . Sexual activity: Not on file  Lifestyle  . Physical activity:    Days per week: Not on file     Minutes per session: Not on file  . Stress: Not on file  Relationships  . Social connections:    Talks on phone: Not on file    Gets together: Not on file    Attends religious service: Not on file  Active member of club or organization: Not on file    Attends meetings of clubs or organizations: Not on file    Relationship status: Not on file  Other Topics Concern  . Not on file  Social History Narrative  . Not on file     Family History:  The patient's family history includes Asthma in her brother; Cancer in her brother, brother, and sister; Heart attack in her father; Stroke in her father.   ROS:   Please see the history of present illness.    ROS All other systems reviewed and are negative.   PHYSICAL EXAM:   VS:  BP (!) 142/70   Pulse (!) 56   Ht 5\' 4"  (1.626 m)   Wt 114 lb (51.7 kg)   BMI 19.57 kg/m      General: Alert, oriented x3, no distress, lean Head: no evidence of trauma, PERRL, EOMI, no exophtalmos or lid lag, no myxedema, no xanthelasma; normal ears, nose and oropharynx Neck: normal jugular venous pulsations and no hepatojugular reflux; brisk carotid pulses without delay and no carotid bruits Chest: clear to auscultation, no signs of consolidation by percussion or palpation, normal fremitus, symmetrical and full respiratory excursions Cardiovascular: normal position and quality of the apical impulse, regular rhythm, normal first and second heart sounds, no murmurs, rubs or gallops Abdomen: no tenderness or distention, no masses by palpation, no abnormal pulsatility or arterial bruits, normal bowel sounds, no hepatosplenomegaly Extremities: no clubbing, cyanosis or edema; 2+ radial, ulnar and brachial pulses bilaterally; 2+ right femoral, posterior tibial and dorsalis pedis pulses; 2+ left femoral, posterior tibial and dorsalis pedis pulses; no subclavian or femoral bruits Neurological: grossly nonfocal Psych: Normal mood and affect    Wt Readings from  Last 3 Encounters:  03/20/18 114 lb (51.7 kg)  08/13/17 111 lb (50.3 kg)  01/04/17 122 lb 9.6 oz (55.6 kg)      Studies/Labs Reviewed:   EKG:  EKG is ordered today.  The ekg ordered today is normal, normal sinus rhythm Recent Labs: 03/29/2017 hemoglobin A1c 5.4%, normal LFTs, mean, creatinine 0.76, potassium 4.7 cholesterol 220, HDL 62, LDL 122, triglycerides 79  ASSESSMENT:    1. Precordial chest pain   2. Essential hypertension   3. Hypercholesterolemia      PLAN:  In order of problems listed above:  1. Chest pain: Again today, her symptoms are highly variable and sound convincingly musculoskeletal.  It is possible that cardiac enzyme elevation was related to Takotsubo syndrome considering the circumstances that occurred in..  She did have a questionable anterior defect on her nuclear stress test in 2015, although this was most likely explained by shifting breast attenuation artifact.  To clarify the situation we will reschedule her for coronary CT angiogram.  Her heart rate is slow even without beta-blockers and I do not think she will need additional metoprolol for the planned coronary CT. 2. HTN: Currently off carvedilol with a fair, albeit not perfect blood pressure.  Continue the ACE inhibitor. 3. HLP: Her LDL is quite high.  She is on a tiny dose of simvastatin and reports being intolerant of other statins in the past.  The decision whether or not to intensify lipid-lowering therapy, probably with PCSK9 inhibitors, depends on the findings on the coronary CT (presence of stenosis, calcium score).      Medication Adjustments/Labs and Tests Ordered: Current medicines are reviewed at length with the patient today.  Concerns regarding medicines are outlined above.  Medication changes, Labs and  Tests ordered today are listed in the Patient Instructions below. Patient Instructions  Please arrive at the [facility] main entrance of [facility] at xx:xx AM (30-45 minutes prior  to test start time)  [facility] [address] [place], [place] [address] [phone]  Proceed to the [facility] Radiology Department (First Floor).  Please follow these instructions carefully (unless otherwise directed):  Hold all erectile dysfunction medications at least 48 hours prior to test.  On the Night Before the Test: . Be sure to Drink plenty of water. . Do not consume any caffeinated/decaffeinated beverages or chocolate 12 hours prior to your test. . Do not take any antihistamines 12 hours prior to your test. . If you take Metformin do not take 24 hours prior to test. . If the patient has contrast allergy: ? Patient will need a prescription for Prednisone and very clear instructions (as follows): 1. Prednisone 50 mg - take 13 hours prior to test 2. Take another Prednisone 50 mg 7 hours prior to test 3. Take another Prednisone 50 mg 1 hour prior to test 4. Take VISTARIL  50 mg 1 hour prior to test . Patient must complete all four doses of above prophylactic medications. . Patient will need a ride after test due to [facility]  On the Day of the Test: . Drink plenty of water. Do not drink any water within one hour of the test. . Do not eat any food 4 hours prior to the test. . You may take your regular medications prior to the test  After the Test: . Drink plenty of water. . After receiving IV contrast, you may experience a mild flushed feeling. This is normal. . On occasion, you may experience a mild rash up to 24 hours after the test. This is not dangerous. If this occurs, you can take Benadryl 25 mg and increase your fluid intake. . If you experience trouble breathing, this can be serious. If it is severe call 911 IMMEDIATELY. If it is mild, please call our office. . If you take any of these medications: Glipizide/Metformin, Avandament, Glucavance, please do not take 48 hours after completing test.  Your physician recommends that you schedule a  follow-up appointment in: 12 MONTHS WITH DR Lenard Forth, MD  03/20/2018 2:35 PM    [facility] [address] [place], [place], [place]  [address] Phone: [phone]; Fax: [phone]

## 2018-03-20 NOTE — Patient Instructions (Addendum)
Please arrive at the [facility] main entrance of [facility] at xx:xx AM (30-45 minutes prior to test start time)  [facility] [address] [place], [place] [address] [phone]  Proceed to the [facility] Radiology Department (First Floor).  Please follow these instructions carefully (unless otherwise directed):  Hold all erectile dysfunction medications at least 48 hours prior to test.  On the Night Before the Test: . Be sure to Drink plenty of water. . Do not consume any caffeinated/decaffeinated beverages or chocolate 12 hours prior to your test. . Do not take any antihistamines 12 hours prior to your test. . If you take Metformin do not take 24 hours prior to test. . If the patient has contrast allergy: ? Patient will need a prescription for Prednisone and very clear instructions (as follows): 1. Prednisone 50 mg - take 13 hours prior to test 2. Take another Prednisone 50 mg 7 hours prior to test 3. Take another Prednisone 50 mg 1 hour prior to test 4. Take VISTARIL  50 mg 1 hour prior to test . Patient must complete all four doses of above prophylactic medications. . Patient will need a ride after test due to [facility]  On the Day of the Test: . Drink plenty of water. Do not drink any water within one hour of the test. . Do not eat any food 4 hours prior to the test. . You may take your regular medications prior to the test  After the Test: . Drink plenty of water. . After receiving IV contrast, you may experience a mild flushed feeling. This is normal. . On occasion, you may experience a mild rash up to 24 hours after the test. This is not dangerous. If this occurs, you can take Benadryl 25 mg and increase your fluid intake. . If you experience trouble breathing, this can be serious. If it is severe call 911 IMMEDIATELY. If it is mild, please call our office. . If you take any of these medications: Glipizide/Metformin, Avandament, Glucavance,  please do not take 48 hours after completing test.  Your physician recommends that you schedule a follow-up appointment in: 12 MONTHS WITH DR Royann Shivers

## 2018-04-11 ENCOUNTER — Encounter: Payer: Self-pay | Admitting: Cardiovascular Disease

## 2018-05-01 ENCOUNTER — Telehealth: Payer: Self-pay

## 2018-05-01 ENCOUNTER — Telehealth: Payer: Self-pay | Admitting: Cardiovascular Disease

## 2018-05-01 DIAGNOSIS — R0789 Other chest pain: Secondary | ICD-10-CM

## 2018-05-01 DIAGNOSIS — I10 Essential (primary) hypertension: Secondary | ICD-10-CM

## 2018-05-01 DIAGNOSIS — R079 Chest pain, unspecified: Secondary | ICD-10-CM

## 2018-05-01 NOTE — Telephone Encounter (Signed)
Called patient daughter, advised that the order was placed to [facility] and released, they should be able to pull the order.  Patient daughter verbalized understanding.

## 2018-05-01 NOTE — Telephone Encounter (Signed)
Received a call from Omar Person patient is scheduled for a cardiac ct Mon 05/06/18 at 3:30 pm.Arrive at [facility] hospital at 3:00 pm.Stated she already told patient.Stated patient told her she was allergic to IV contrast and to prednisone and benadryl.Also stated she wanted to have bmet done at [facility] in [place]. Spoke to patient's daughter Cordelia Pen advised mother can have bmet done at [facility] in [place] tomorrow 3/12.Order placed and released.Advised if lab cannot see order have them call our office.Also I spoke to our pharmacist about mother's allergy to benadryl and prednisone.She advised me to call hospital pharmacist for a secondary protocol.Spoke to [employer] D at [facility] he will checking on a secondary protocol and will send me a email.Advised I will call her back Fri 3/13 with his recommendations.

## 2018-05-01 NOTE — Telephone Encounter (Signed)
New Message    Pts daughter is calling because they would like to her the Pts labs drawn at [facility] in [place]  Please call back

## 2018-05-03 ENCOUNTER — Telehealth (HOSPITAL_COMMUNITY): Payer: Self-pay | Admitting: Emergency Medicine

## 2018-05-03 ENCOUNTER — Telehealth: Payer: Self-pay

## 2018-05-03 LAB — BASIC METABOLIC PANEL
BUN/Creatinine Ratio: 19 (ref 12–28)
BUN: 15 mg/dL (ref 8–27)
CO2: 23 mmol/L (ref 20–29)
Calcium: 9.8 mg/dL (ref 8.7–10.3)
Chloride: 104 mmol/L (ref 96–106)
Creatinine, Ser: 0.81 mg/dL (ref 0.57–1.00)
GFR calc Af Amer: 77 mL/min/{1.73_m2} (ref >59)
GFR calc non Af Amer: 66 mL/min/{1.73_m2} (ref >59)
Glucose: 92 mg/dL (ref 65–99)
Potassium: 4.7 mmol/L (ref 3.5–5.2)
Sodium: 137 mmol/L (ref 134–144)

## 2018-05-03 MED ORDER — HYDROXYZINE HCL 25 MG PO TABS: ORAL_TABLET | ORAL | 1 refills | Status: DC

## 2018-05-03 NOTE — Telephone Encounter (Signed)
Reaching out to patient to offer assistance regarding upcoming cardiac imaging study; pt verbalizes understanding of appt date/time, parking situation and where to check in, pre-test NPO status and medications ordered, and verified current allergies; name and call back number provided for further questions should they arise Rockwell Alexandria RN Navigator Cardiac Imaging [facility] [phone] office [phone] cell  Reviewed with husband that patient is to take prednisone at 2:30a, 8:30a, and 2:30p prior to scan

## 2018-05-03 NOTE — Telephone Encounter (Signed)
Spoke to patient's daughter Cordelia Pen mother's Coronary CT instructions reviewed.She voiced understanding.Mother is to take Vistaril 50 mg 1 hour prior to arrival.Vistaril is in place of Benadryl since mother is allergic to Benadryl. Mother will also take Coreg 3.125mg  1 hour prior.She will arrive 30 min early 3:00 pm 05/06/18.

## 2018-05-06 ENCOUNTER — Telehealth (HOSPITAL_COMMUNITY): Payer: Self-pay | Admitting: Emergency Medicine

## 2018-05-06 ENCOUNTER — Ambulatory Visit (HOSPITAL_COMMUNITY)
Admission: RE | Admit: 2018-05-06 | Discharge: 2018-05-06 | Disposition: A | Discharge status: Home / Self Care | Payer: Medicare Other | Source: Ambulatory Visit | Attending: Cardiovascular Disease | Admitting: Cardiovascular Disease

## 2018-05-06 ENCOUNTER — Other Ambulatory Visit: Payer: Self-pay

## 2018-05-06 ENCOUNTER — Encounter (HOSPITAL_COMMUNITY): Payer: Self-pay

## 2018-05-06 DIAGNOSIS — R072 Precordial pain: Secondary | ICD-10-CM | POA: Diagnosis present

## 2018-05-06 MED ORDER — IOHEXOL 350 MG/ML SOLN
100.0000 mL | Freq: Once | INTRAVENOUS | Status: AC | PRN
  Administered 2018-05-06: 100 mL via INTRAVENOUS

## 2018-05-06 MED ORDER — NITROGLYCERIN 0.4 MG SL SUBL
SUBLINGUAL_TABLET | SUBLINGUAL | Status: AC
  Administered 2018-05-06: 0.8 mg via SUBLINGUAL
  Filled 2018-05-06: qty 2

## 2018-05-06 MED ORDER — NITROGLYCERIN 0.4 MG SL SUBL
0.8000 mg | SUBLINGUAL_TABLET | Freq: Once | SUBLINGUAL | Status: AC
  Administered 2018-05-06: 0.8 mg via SUBLINGUAL
  Filled 2018-05-06: qty 25

## 2018-05-06 MED ORDER — METOPROLOL TARTRATE 5 MG/5ML IV SOLN
INTRAVENOUS | Status: AC
  Filled 2018-05-06: qty 5

## 2018-05-06 NOTE — Telephone Encounter (Signed)
pts daughter calling to clarify medications prior to CT scan.  Pt does NOT take coreg, however HR in last office visit was 56bpm. Not sure that she would need a beta blocker prior to scan. Also verified that patient had started her 13 hr prep for contrast allergy which she has. Daughter had no other questions, will see patient today at 3pm. Rockwell Alexandria RN Navigator Cardiac Imaging [facility] [phone] Office  [phone] Cell

## 2018-05-09 ENCOUNTER — Other Ambulatory Visit: Payer: Self-pay

## 2018-05-10 ENCOUNTER — Emergency Department (HOSPITAL_COMMUNITY): Payer: Medicare Other

## 2018-05-10 ENCOUNTER — Encounter (HOSPITAL_COMMUNITY): Payer: Self-pay

## 2018-05-10 ENCOUNTER — Emergency Department (HOSPITAL_COMMUNITY)
Admission: EM | Admit: 2018-05-10 | Discharge: 2018-05-10 | Disposition: A | Discharge status: Home / Self Care | Payer: Medicare Other | Attending: Emergency Medicine | Admitting: Emergency Medicine

## 2018-05-10 DIAGNOSIS — Z79899 Other long term (current) drug therapy: Secondary | ICD-10-CM | POA: Diagnosis not present

## 2018-05-10 DIAGNOSIS — R0789 Other chest pain: Secondary | ICD-10-CM | POA: Insufficient documentation

## 2018-05-10 DIAGNOSIS — Z7982 Long term (current) use of aspirin: Secondary | ICD-10-CM | POA: Insufficient documentation

## 2018-05-10 DIAGNOSIS — I10 Essential (primary) hypertension: Secondary | ICD-10-CM | POA: Insufficient documentation

## 2018-05-10 LAB — COMPREHENSIVE METABOLIC PANEL
ALT: 26 U/L (ref 0–44)
AST: 24 U/L (ref 15–41)
Albumin: 4.2 g/dL (ref 3.5–5.0)
Alkaline Phosphatase: 64 U/L (ref 38–126)
Anion gap: 10 (ref 5–15)
BUN: 12 mg/dL (ref 8–23)
CHLORIDE: 104 mmol/L (ref 98–111)
CO2: 23 mmol/L (ref 22–32)
CREATININE: 0.81 mg/dL (ref 0.44–1.00)
Calcium: 9.8 mg/dL (ref 8.9–10.3)
GFR calc Af Amer: >60 mL/min (ref >60)
GFR calc non Af Amer: >60 mL/min (ref >60)
Glucose, Bld: 123 mg/dL — ABNORMAL HIGH (ref 70–99)
Potassium: 4.6 mmol/L (ref 3.5–5.1)
Sodium: 137 mmol/L (ref 135–145)
Total Bilirubin: 0.6 mg/dL (ref 0.3–1.2)
Total Protein: 7.2 g/dL (ref 6.5–8.1)

## 2018-05-10 LAB — CBC
HCT: 38.8 % (ref 36.0–46.0)
Hemoglobin: 12.5 g/dL (ref 12.0–15.0)
MCH: 30.5 pg (ref 26.0–34.0)
MCHC: 32.2 g/dL (ref 30.0–36.0)
MCV: 94.6 fL (ref 80.0–100.0)
NRBC: 0 % (ref 0.0–0.2)
Platelets: 243 10*3/uL (ref 150–400)
RBC: 4.1 MIL/uL (ref 3.87–5.11)
RDW: 14.6 % (ref 11.5–15.5)
WBC: 8.7 10*3/uL (ref 4.0–10.5)

## 2018-05-10 LAB — TROPONIN I: Troponin I: <0.03 ng/mL (ref <0.03)

## 2018-05-10 LAB — BRAIN NATRIURETIC PEPTIDE: B Natriuretic Peptide: 65.4 pg/mL (ref 0.0–100.0)

## 2018-05-10 LAB — I-STAT TROPONIN, ED: Troponin i, poc: 0.01 ng/mL (ref 0.00–0.08)

## 2018-05-10 LAB — CBG MONITORING, ED: Glucose-Capillary: 109 mg/dL — ABNORMAL HIGH (ref 70–99)

## 2018-05-10 MED ORDER — ASPIRIN 81 MG PO CHEW
243.0000 mg | CHEWABLE_TABLET | Freq: Once | ORAL | Status: AC
  Administered 2018-05-10: 243 mg via ORAL
  Filled 2018-05-10: qty 3

## 2018-05-10 MED ORDER — NITROGLYCERIN 0.4 MG SL SUBL: 0.4000 mg | SUBLINGUAL_TABLET | SUBLINGUAL | Status: DC | PRN

## 2018-05-10 NOTE — ED Notes (Signed)
XR confirmed someone on the way for portable chest

## 2018-05-10 NOTE — ED Notes (Signed)
Reviewed d/c instructions with pt, who verbalized understanding and had no outstanding questions. Pt requested that her daughter sign for her d/c instructions. [employer] & labels removed and placed in shred bin. Pt departed in NAD.

## 2018-05-10 NOTE — ED Provider Notes (Signed)
[facility] EMERGENCY DEPARTMENT Provider Note   CSN: [id number] Arrival date & time: 05/10/18  1522    History   Chief Complaint Chief Complaint  Patient presents with   Chest Pain    HPI Leah Pittman is a 85 y.o. female who presents the emergency department complaining of intermittent diffuse pressure-like chest pain over the course of the last few months.  Patient has establish care with her cardiologist who ordered an outpatient CT coronary fractional flow study which was performed 3 days ago.  Upon reviewing this report, the patient has severe stenosis in the proximal LAD and left circumflex arteries.  She states that her cardiologist was made aware of this however wanted to reevaluate her in 2 weeks for potential heart catheterization.  Patient's PCP reviewed the results today and told her to come the emergency department for evaluation.  She states that this morning she had 1 episode of pressure-like chest pain after getting up from a seated like position.  She states that she sat back down and pain self resolved after about 20 minutes.  She states that since her intermittent chest pain started a few months ago she is noticed progressive dyspnea with minimal exertion.      Illness  Severity:  Severe Onset quality:  Gradual Duration:  2 months Timing:  Intermittent Progression:  Worsening Chronicity:  New Associated symptoms: chest pain, fatigue and shortness of breath   Associated symptoms: no abdominal pain, no cough, no ear pain, no fever, no rash, no sore throat and no vomiting     Past Medical History:  Diagnosis Date   Anxiety    Anxiety    Arthritis    Chest pain    CARDIOLITE - no significant symptoms, EKG changes, or arrhythmias   Dyspnea 02/16/2012   2D ECHO - EF 55-60%, normal   Fatigue    Glaucoma    Headache(784.0)    Hiatal hernia    High blood pressure 02/16/2012   RENAL DOPPLER - normal   Hypothyroidism    Labile  hypertension    Lightheadedness    Migraine headache    Multiple allergies    Myalgia    Pain, lower leg    Calf   Problem of menstruation    Reflux    SOB (shortness of breath)    Thyroid disease    Urinary problem     Patient Active Problem List   Diagnosis Date Noted   Essential hypertension 08/14/2017   Hypercholesterolemia 11/30/2015   Precordial chest pain 01/13/2015   Chest pain of uncertain etiology 12/12/2013   Glaucoma 09/29/2012   Anxiety    Labile hypertension    Migraine headache     Past Surgical History:  Procedure Laterality Date   ABDOMINAL HYSTERECTOMY  1976   CHOLECYSTECTOMY  2000   CYSTECTOMY  1974   Intestine   CYSTECTOMY  1996   Brain stem   EYE SURGERY  2003     OB History   No obstetric history on file.      Home Medications    Prior to Admission medications   Medication Sig Start Date End Date Taking? Authorizing Provider  acetaminophen (TYLENOL) 500 MG tablet Take 500 mg by mouth every 8 (eight) hours as needed for mild pain or headache.  09/29/14  Yes [provider]  Ascorbic Acid (VITAMIN C) 1000 MG tablet Take 1,000 mg by mouth daily.   Yes [provider]  aspirin 81 MG tablet  Take 81 mg by mouth daily.   Yes [provider]  Calcium Citrate-Vitamin D (CALCIUM CITRATE+D3 PETITES PO) Take 1 tablet by mouth daily.   Yes [provider]  carboxymethylcellulose (REFRESH TEARS) 0.5 % SOLN Place 1-2 drops into both eyes as needed (for dryness or redness).   Yes [provider]  dorzolamide-timolol (COSOPT) 22.3-6.8 MG/ML ophthalmic solution Place 1 drop into the left eye 2 (two) times daily.  06/05/12  Yes [provider]  lisinopril (PRINIVIL,ZESTRIL) 20 MG tablet Take 20 mg by mouth daily.   Yes [provider]  LORazepam (ATIVAN) 1 MG tablet Take 1 mg by mouth at bedtime.    Yes [provider]  magnesium hydroxide (MILK OF MAGNESIA) 400 MG/5ML  suspension Take 15-30 mLs by mouth daily as needed for mild constipation.   Yes [provider]  magnesium oxide (MAG-OX) 400 MG tablet Take 400 mg by mouth daily.   Yes [provider]  ondansetron (ZOFRAN) 8 MG tablet Take 8 mg by mouth 2 (two) times daily as needed for nausea or vomiting.   Yes [provider]  pantoprazole (PROTONIX) 40 MG tablet Take 40 mg by mouth daily.  04/23/12  Yes [provider]  senna (SENOKOT) 8.6 MG TABS tablet Take 1 tablet by mouth daily as needed for mild constipation.   Yes [provider]  simvastatin (ZOCOR) 20 MG tablet Take 20 mg by mouth daily.   Yes [provider]  SYNTHROID 88 MCG tablet Take 88 mcg by mouth daily before breakfast. 04/02/18  Yes [provider]  Travoprost, BAK Free, (TRAVATAN Z) 0.004 % SOLN ophthalmic solution Place 1 drop into both eyes at bedtime.  11/20/14  Yes [provider]  Vitamin D, Ergocalciferol, (DRISDOL) 1.25 MG (50000 UT) CAPS capsule Take 50,000 Units by mouth every Monday.  04/18/18  Yes [provider]  hydrOXYzine (ATARAX/VISTARIL) 25 MG tablet TAKE 2 TABLETS ONE HOUR PRIOR TO SCAN Patient not taking: Reported on 05/10/2018 05/03/18   Croitoru, Rachelle Hora, MD  predniSONE (DELTASONE) 50 MG tablet Take 1 tablet 13 hours prior to scan, 7 hours prior to scan and 1 hour prior to scan Patient not taking: Reported on 05/10/2018 03/20/18   Croitoru, Rachelle Hora, MD    Family History Family History  Problem Relation Age of Onset   Heart attack Father    Stroke Father    Cancer Sister    Cancer Brother    Asthma Brother    Cancer Brother     Social History Social History   Tobacco Use   Smoking status: Never Smoker   Smokeless tobacco: Never Used  Substance Use Topics   Alcohol use: No   Drug use: No     Allergies   Benadryl [diphenhydramine]; Codeine; Meperidine; Promethazine; Propranolol; Shellfish allergy; Tape; Tazarotene; and  Iodinated diagnostic agents   Review of Systems Review of Systems  Constitutional: Positive for fatigue and unexpected weight change. Negative for chills and fever.  HENT: Negative for ear pain and sore throat.   Eyes: Positive for visual disturbance (chronic secondary to glaucoma). Negative for pain.  Respiratory: Positive for shortness of breath. Negative for cough.   Cardiovascular: Positive for chest pain. Negative for palpitations.  Gastrointestinal: Positive for constipation (chronic). Negative for abdominal pain and vomiting.  Genitourinary: Negative for dysuria and hematuria.  Musculoskeletal: Negative for arthralgias and back pain.  Skin: Negative for color change and rash.  Neurological: Positive for weakness. Negative for seizures and syncope.  All other systems reviewed and are negative.    Physical Exam Updated Vital Signs BP (!) 180/59    Pulse 65    Resp 10    Ht 5\' 4"  (1.626 m)    Wt 51.3 kg    SpO2 100%    BMI 19.40 kg/m   Physical Exam Vitals signs and nursing note reviewed.  Constitutional:      General: She is not in acute distress.    Appearance: She is well-developed.  HENT:     Head: Normocephalic and atraumatic.  Eyes:     Conjunctiva/sclera: Conjunctivae normal.  Neck:     Musculoskeletal: Neck supple.  Cardiovascular:     Rate and Rhythm: Normal rate and regular rhythm.     Heart sounds: No murmur.  Pulmonary:     Effort: Pulmonary effort is normal. No respiratory distress.     Breath sounds: Normal breath sounds.  Abdominal:     Palpations: Abdomen is soft.     Tenderness: There is no abdominal tenderness.  Skin:    General: Skin is warm and dry.  Neurological:     Mental Status: She is alert.     Comments: Alert and oriented x3.  Cranial nerves II through XII intact.  5 out of 5 strength in all 4 extremities.  Normal finger-to-nose.  Negative pronator drift.      ED Treatments / Results  Labs (all labs ordered are listed, but only  abnormal results are displayed) Labs Reviewed  COMPREHENSIVE METABOLIC PANEL - Abnormal; Notable for the following components:      Result Value   Glucose, Bld 123 (*)    All other components within normal limits  CBG MONITORING, ED - Abnormal; Notable for the following components:   Glucose-Capillary 109 (*)    All other components within normal limits  CBC  BRAIN NATRIURETIC PEPTIDE  TROPONIN I  I-STAT TROPONIN, ED    EKG EKG Interpretation  Date/Time:  Friday May 10 2018 15:32:35 EDT Ventricular Rate:  82 PR Interval:    QRS Duration: 82 QT Interval:  403 QTC Calculation: 471 R Axis:   73 Text Interpretation:  Sinus rhythm ST changes laterally similar to prior.  No STEMI.  Confirmed by Alona Bene [phone]) on 05/10/2018 3:34:56 PM   Radiology Dg Chest Portable 1 View  Result Date: 05/10/2018 CLINICAL DATA:  Pt. C/o SOB and CP x 1 day. EXAM: PORTABLE CHEST 1 VIEW COMPARISON:  07/30/2017, 05/06/2018 FINDINGS: Heart size is normal. The lungs are free of focal consolidations and pleural effusions. No pulmonary edema. IMPRESSION: No active disease. Electronically Signed   By: Norva Pavlov M.D.   On: 05/10/2018 16:58    Procedures Procedures (including critical care time)  Medications Ordered in ED Medications  nitroGLYCERIN (NITROSTAT) SL tablet 0.4 mg (has no administration in time range)  aspirin chewable tablet 243 mg (243 mg Oral Given 05/10/18 1553)     Initial Impression / Assessment and Plan / ED Course  I have reviewed the triage vital signs and the nursing notes.  Pertinent labs & imaging results that were available during my care of the patient were reviewed by me and considered in my medical decision making (see chart for details).        Patient is an 85 year old female with history of hypertension and hypercholesterolemia who presents the emergency department complaining of intermittent chest pain over the course last few months with recent CT FFR  on 05/06/18 showing severe LAD stenosis as  well as the left circumflex.  Patient states that 20-minute episode of centrally located chest pain earlier this morning as well as PCP review of recent CT FFR prompted her to come to the emergency department for evaluation.  On initial evaluation the patient she was hemodynamically stable and nontoxic-appearing.  Patient was afebrile, not tachycardic, hypertensive at 204/73, satting 99% on room air.  Physical exam as detailed above which was remarkable for lungs that are clear to auscultation bilaterally with no murmurs rubs or gallops appreciated.  Patient has equal pulses in all 4 extremities.  Abdomen is benign.  Neurologic examination with no focal deficits.  EKG obtained shortly after arrival to the emergency department shows normal sinus rhythm at 82 bpm, normal axis, good R wave progression, and ST depressions noted in V4-V6 with no reciprocal changes noted.  This EKG is unchanged from EKGs dating back to 2011.  Chest x-ray shows no acute cardiac or pulmonary pathology.  CBC and CMP within normal limits.  Troponin negative at 0.01.  Patient's case was discussed with her cardiologist Dr. Royann Shivers who is extremely familiar with the patient.  I went over the patient's presenting complaints, EKG, and initial troponin with Dr. Keene Breath who is very reassured with these results.  He is very confident given the intermittent nature of the patient's chronic chest pain which appears to have a multitude of etiologies including musculoskeletal and even emotional component given the patient's numerous life stressors.  Discussion centered around patient's high risk nature given the current pandemic of COVID-19 and recent post viral pneumonia which required hospitalization.  Her husband is also a lymphoma patient currently on chemotherapy.  With no objective data such as EKG changes or elevated cardiac biomarkers Dr. Keene Breath does not feel the patient warrants urgent cardiac  catheterization, and that admission to the hospital may put the patient at undue risk.  Given his extensive knowledge of the patient and her case I feel this is an appropriate plan.  The patient was able to ambulate greater than 50 feet with continuous oxygen monitoring.  No oxygen desaturations were present upon ambulation.  Given the intermittent and chronic nature of the patient's chest pain with no evidence of hypoxemia, doubt pulmonary embolism as contributing to her current symptoms.  Delta troponin was obtained on the patient which was also negative.  Patient remained chest pain-free while in the emergency department. Hypertension improved without intervention. I had an extensive discussion with the patient and her daughter who are at bedside.  They were both reassured.  I informed them that Dr. Keene Breath will be the on-call cardiologist over the weekend and that if they have any additional questions or additional symptoms upon returning home, to call him for advice.  I discussed concerning signs and symptoms that would necessitate return to the emergency department.  Patient and daughter both voiced understanding of these instructions and had no further questions at this time.  Final Clinical Impressions(s) / ED Diagnoses   Final diagnoses:  Atypical chest pain    ED Discharge Orders    None       Leonette Monarch, MD 05/10/18 2116    Maia Plan, MD 05/11/18 [phone]

## 2018-05-10 NOTE — Discharge Instructions (Addendum)
Call your cardiologist Dr. Royann Shivers if your chest pain returns for instructions.

## 2018-05-10 NOTE — ED Triage Notes (Addendum)
Pt endorses she was told she had 85% blockage in coronary artery by PCP and told she needed admission/cath. Pt reports some CP this morning for 15 minutes, none currently. Pt endorses some lethargy and sob. Pt alert and oriented. Pt endorses intermittent chest tightness x months. Pt endorses some nausea. Pt took 81 mg ASA this am.

## 2018-05-10 NOTE — ED Notes (Signed)
Pt ambulatory with assistance from this NT. SpO2 consistently 100% while ambulating. Pt stated her legs felt weak but that it was an ongoing issue.

## 2018-05-10 NOTE — ED Notes (Signed)
ED Provider at bedside. 

## 2018-06-20 ENCOUNTER — Telehealth: Payer: Self-pay | Admitting: Cardiovascular Disease

## 2018-06-20 NOTE — Telephone Encounter (Signed)
Virtual visit scheduled with Theodore Demark PA on 06/25/18 at 10:30  Pt needs cath  abnormal Cardiac CT./cy

## 2018-06-20 NOTE — Telephone Encounter (Signed)
New Message    Pt is calling because her PC told her to follow up with cardiology  At times she still has some chest pain, its not bad and doesn't last long.    Pt c/o of Chest Pain: STAT if CP now or developed within 24 hours  1. Are you having CP right now? No pain but feels heavy   2. Are you experiencing any other symptoms (ex. SOB, nausea, vomiting, sweating)? Coughing, and doesn't have a cold, some SOB when it happens, exhaustion   3. How long have you been experiencing CP? She has been having for months but it seems like it is getting a little worse each time   4. Is your CP continuous or coming and going? Comes and goes   5. Have you taken Nitroglycerin? No  ?

## 2018-06-20 NOTE — Telephone Encounter (Signed)
Spoke with pt and continues to note chest heaviness, nausea, and fatigue Per pt has not taken any ntg at this time ,but  was told to touch base with Cardiologist by PCP Which pt has appt tom with Dr Sedalia Muta Pt is to have elective cath in near future per cardiac ct findings .Will forward to Dr Royann Shivers with  pt update .Leah Pittman

## 2018-06-20 NOTE — Telephone Encounter (Signed)
Lm to call back ./cy 

## 2018-06-20 NOTE — Telephone Encounter (Signed)
I think the lab will be gradually reopening in the next 2 weeks. We need to start the process of getting her on the schedule, please.

## 2018-06-24 ENCOUNTER — Telehealth: Payer: Self-pay | Admitting: Physician Assistant

## 2018-06-25 ENCOUNTER — Other Ambulatory Visit: Payer: Self-pay | Admitting: Physician Assistant

## 2018-06-25 ENCOUNTER — Telehealth (INDEPENDENT_AMBULATORY_CARE_PROVIDER_SITE_OTHER): Payer: Medicare Other | Admitting: Physician Assistant

## 2018-06-25 ENCOUNTER — Encounter: Payer: Self-pay | Admitting: Physician Assistant

## 2018-06-25 VITALS — BP 147/60 | HR 71 | Temp 95.4°F | Ht 64.0 in | Wt 114.5 lb

## 2018-06-25 DIAGNOSIS — R079 Chest pain, unspecified: Secondary | ICD-10-CM | POA: Diagnosis not present

## 2018-06-25 MED ORDER — PREDNISONE 50 MG PO TABS: ORAL_TABLET | ORAL | 0 refills | Status: DC

## 2018-06-25 NOTE — H&P (View-Only) (Signed)
Thanks, Rhonda. I will be rounding that week and can check on her then. MCr 

## 2018-06-25 NOTE — Addendum Note (Signed)
Addended by: Evans Lance on: 06/25/2018 02:35 PM   Modules accepted: Orders

## 2018-06-25 NOTE — Progress Notes (Signed)
Thanks, Bjorn Loser. I will be rounding that week and can check on her then. MCr

## 2018-06-25 NOTE — Progress Notes (Signed)
Virtual Visit via Video Note   This visit type was conducted due to national recommendations for restrictions regarding the COVID-19 Pandemic (e.g. social distancing) in an effort to limit this patient's exposure and mitigate transmission in our community.  Due to her co-morbid illnesses, this patient is at least at moderate risk for complications without adequate follow up.  This format is felt to be most appropriate for this patient at this time.  All issues noted in this document were discussed and addressed.  A limited physical exam was performed with this format.  Please refer to the patient's chart for her consent to telehealth for [facility].   Date:  06/25/2018   ID:  Leah Pittman, DOB 03/01/1930, MRN [id number]  Patient Location: Home Provider Location: Home  PCP:  Blane Ohara, MD  Cardiologist:  Thurmon Fair, MD 03/20/2018 Electrophysiologist:  None   Evaluation Performed:  Follow-Up Visit  Chief Complaint:  Chest pain  History of Present Illness:    Leah Pittman is a 85 y.o. female with HTN, HLD (severe), anxiety, GERD, HH.   03/20/2018 office visit, pt c/o chest pain>>cardiac CTA scheduled>>abnl w/ prox LAD dz but cath deferred due to COVID concerns.  05/10/2018 ER visit for CP but due to concerns for COVID, ez and ECG ok, pt husband on chemo for lymphoma>>postpone cath 06/20/2018 phone notes regarding chest pain>>appt made to consider cath.  The patient does not have symptoms concerning for COVID-19 infection (fever, chills, cough, or new shortness of breath).   She gets chest pain on a regular basis. Last episode was last pm. It was a heaviness, 5-6/10. She had nausea with it but is not sure that is related as she has that frequently anyway. She was a little SOB with it.   This is her usual chest pain. She gets episodes several times a week, but may be underestimating because she does not always associate chest heaviness w/ pain.   She is also having  problems with DOE and fatigue. Her activity is limited by the DOE. Upon further questioning, she also has some chest heaviness at times with the DOE. One notable episode happened when walking across [employer], she had to sit for 10-15 minutes to feel better.   She has not taken any nitro, thought about it once or twice. PCP has given her appropriate instructions for it.   She was cold all day 05/04, did not check her temp. Her temp this am was 95.5. She has a cough first thing am, and coughs up clear phlegm. Gets better after about an hour. Sounds hoarse at times. This has been going on for several months. PCP is aware and feels it is seasonal allergies.   Past Medical History:  Diagnosis Date  . Anxiety   . Arthritis   . Chest pain 2011   CARDIOLITE - no significant symptoms, EKG changes, or arrhythmias  . Dyspnea 02/16/2012   2D ECHO - EF 55-60%, normal  . Fatigue   . Glaucoma   . Headache(784.0)   . Hiatal hernia   . High blood pressure 02/16/2012   RENAL DOPPLER - normal  . Hyperlipidemia   . Hypothyroidism   . Labile hypertension   . Lightheadedness   . Migraine headache   . Multiple allergies   . Myalgia   . Pain, lower leg    Calf  . Problem of menstruation   . Reflux   . SOB (shortness of breath)   . Thyroid disease   .  Urinary problem    Past Surgical History:  Procedure Laterality Date  . ABDOMINAL HYSTERECTOMY  1976  . CHOLECYSTECTOMY  2000  . CYSTECTOMY  1974   Intestine  . CYSTECTOMY  1996   Brain stem  . EYE SURGERY  2003     Current Meds  Medication Sig  . acetaminophen (TYLENOL) 500 MG tablet Take 500 mg by mouth every 8 (eight) hours as needed for mild pain or headache.   . Ascorbic Acid (VITAMIN C) 1000 MG tablet Take 1,000 mg by mouth daily.  Marland Kitchen aspirin 81 MG tablet Take 81 mg by mouth daily.  . Calcium Citrate-Vitamin D (CALCIUM CITRATE+D3 PETITES PO) Take 1 tablet by mouth daily.  . carboxymethylcellulose (REFRESH TEARS) 0.5 % SOLN Place  1-2 drops into both eyes as needed (for dryness or redness).  . dorzolamide-timolol (COSOPT) 22.3-6.8 MG/ML ophthalmic solution Place 1 drop into the left eye 2 (two) times daily.   Marland Kitchen lisinopril (PRINIVIL,ZESTRIL) 20 MG tablet Take 20 mg by mouth daily.  Marland Kitchen LORazepam (ATIVAN) 1 MG tablet Take 1 mg by mouth at bedtime.   . magnesium hydroxide (MILK OF MAGNESIA) 400 MG/5ML suspension Take 15-30 mLs by mouth daily as needed for mild constipation.  . magnesium oxide (MAG-OX) 400 MG tablet Take 400 mg by mouth daily.  . ondansetron (ZOFRAN) 8 MG tablet Take 8 mg by mouth 2 (two) times daily as needed for nausea or vomiting.  . pantoprazole (PROTONIX) 40 MG tablet Take 40 mg by mouth daily.   Marland Kitchen senna (SENOKOT) 8.6 MG TABS tablet Take 1 tablet by mouth daily as needed for mild constipation.  . simvastatin (ZOCOR) 20 MG tablet Take 20 mg by mouth daily.  Marland Kitchen SYNTHROID 88 MCG tablet Take 88 mcg by mouth daily before breakfast.  . Travoprost, BAK Free, (TRAVATAN Z) 0.004 % SOLN ophthalmic solution Place 1 drop into both eyes at bedtime.   . Vitamin D, Ergocalciferol, (DRISDOL) 1.25 MG (50000 UT) CAPS capsule Take 50,000 Units by mouth every Monday.      Allergies:   Benadryl [diphenhydramine]; Codeine; Meperidine; Promethazine; Propranolol; Shellfish allergy; Tape; Tazarotene; and Iodinated diagnostic agents   Social History   Tobacco Use  . Smoking status: Never Smoker  . Smokeless tobacco: Never Used  Substance Use Topics  . Alcohol use: No  . Drug use: No     Family Hx: The patient's family history includes Asthma in her brother; Cancer in her brother, brother, and sister; Heart attack in her father; Stroke in her father.  Family Status: She indicated that her mother is deceased. She indicated that her father is deceased. She indicated that two of her three sisters are alive. She indicated that only one of her six brothers is alive. She indicated that her maternal grandmother is deceased. She  indicated that her maternal grandfather is deceased. She indicated that her paternal grandmother is deceased. She indicated that her paternal grandfather is deceased. She indicated that all of her three children are alive.  Social History   Socioeconomic History  . Marital status: Married    Spouse name: Not on file  . Number of children: Not on file  . Years of education: Not on file  . Highest education level: Not on file  Occupational History  . Occupation: Retired  Engineer, production  . Financial resource strain: Not on file  . Food insecurity:    Worry: Not on file    Inability: Not on file  . Transportation needs:  Medical: Not on file    Non-medical: Not on file  Tobacco Use  . Smoking status: Never Smoker  . Smokeless tobacco: Never Used  Substance and Sexual Activity  . Alcohol use: No  . Drug use: No  . Sexual activity: Not on file  Lifestyle  . Physical activity:    Days per week: Not on file    Minutes per session: Not on file  . Stress: Not on file  Relationships  . Social connections:    Talks on phone: Not on file    Gets together: Not on file    Attends religious service: Not on file    Active member of club or organization: Not on file    Attends meetings of clubs or organizations: Not on file    Relationship status: Not on file  . Intimate partner violence:    Fear of current or ex partner: Not on file    Emotionally abused: Not on file    Physically abused: Not on file    Forced sexual activity: Not on file  Other Topics Concern  . Not on file  Social History Narrative   Lives with husband in [place], [place]    ROS:   Please see the history of present illness.    All other systems reviewed and are negative.   Prior CV studies:   The following studies were reviewed today:  CARDIAC CT 05/06/2018 FINDINGS: FFRct analysis was performed on the original cardiac CT angiogram dataset. Diagrammatic representation of the FFRct analysis is provided in a  separate PDF document in PACS. This dictation was created using the PDF document and an interactive 3D model of the results. 3D model is not available in the EMR/PACS. Normal FFR range is >0.80.  1. Left Main:  No significant stenosis.  2. LAD: Proximal: 0.96, mid: 0.69. 3. LCX: Proximal: 1.0, mid: 0.63. 4. RCA: No significant stenosis.  IMPRESSION: 1. CT FFR analysis showed severe stenosis in the proximal LAD and LCX arteries. A cardiac catheterization is recommended.  ECHO: 12/23/2015 - Left ventricle: The cavity size was normal. Wall thickness was   normal. Systolic function was normal. The estimated ejection   fraction was in the range of 60% to 65%. Wall motion was normal;   there were no regional wall motion abnormalities. Doppler   parameters are consistent with abnormal left ventricular   relaxation (grade 1 diastolic dysfunction). - Aortic valve: There was no stenosis. - Mitral valve: There was no significant regurgitation. - Right ventricle: The cavity size was normal. Systolic function   was normal. - Tricuspid valve: Peak RV-RA gradient (S): 18 mm Hg. - Pulmonary arteries: PA peak pressure: 21 mm Hg (S). - Inferior vena cava: The vessel was normal in size. The   respirophasic diameter changes were in the normal range (>= 50%),   consistent with normal central venous pressure.  Impressions:  - Normal LV size with EF 60-65%. Normal RV size and systolic   function. No significant valvular abnormalities.    Labs/Other Tests and Data Reviewed:    EKG:  An ECG dated 03/20 was personally reviewed today and demonstrated:  SR, HR 82, no sig ischemic changes.  Recent Labs: 05/10/2018: ALT 26; B Natriuretic Peptide 65.4; BUN 12; Creatinine, Ser 0.81; Hemoglobin 12.5; Platelets 243; Potassium 4.6; Sodium 137   Recent Lipid Panel No results found for: CHOL, TRIG, HDL, CHOLHDL, LDLCALC, LDLDIRECT  Wt Readings from Last 3 Encounters:  06/25/18 114 lb 8 oz (51.9 kg)  05/10/18 113 lb (51.3 kg)  03/20/18 114 lb (51.7 kg)     Objective:    Vital Signs:  BP (!) 147/60   Pulse 71   Temp (!) 95.4 F (35.2 C)   Ht 5\' 4"  (1.626 m)   Wt 114 lb 8 oz (51.9 kg)   BMI 19.65 kg/m    VITAL SIGNS:  reviewed GEN:  no acute distress EYES:  sclerae anicteric, EOMI - Extraocular Movements Intact PSYCH:  normal affect  ASSESSMENT & PLAN:    1. Chest pain, high risk of cardiac etiology - pt and husband were on the phone - I explained that the hospital and the cath lab are taking multiple precautions to ensure the safety of patients and staff.  - Her symptoms are concerning and her cardiac CTA was significantly abnormal - Cardiac cath is indicated and she is agreeable to the procedure. - The risks and benefits of a cardiac catheterization including, but not limited to, death, stroke, MI, kidney damage and bleeding were discussed with the patient and her husband, who indicate understanding and agree to proceed.  - She was offered rx for Imdur, prefers not to try it at this time. Understands that if she changes her mind, contact us and we will send in rx for Imdur 30 mg qd. - Because of husband's chemo schedule cannot do this next week, can get labs done 5/15 and have cath 5/18   COVID-19 Education: The signs and symptoms of COVID-19 were discussed with the patient and how to seek care for testing (follow up with PCP or arrange E-visit).  The importance of social distancing was discussed today.  Time:   Today, I have spent 24 minutes with the patient with telehealth technology discussing the above problems.     Medication Adjustments/Labs and Tests Ordered: Current medicines are reviewed at length with the patient today.  Concerns regarding medicines are outlined above.   Tests Ordered: Cardiac cath  Medication Changes: No orders of the defined types were placed in this encounter.   Disposition:  Follow up with Dr Royann [place]  Signed,  Loni Abdon, PA-C  06/25/2018 12:13 PM    [facility]

## 2018-06-25 NOTE — Patient Instructions (Addendum)
    [facility] CARDIOVASCULAR DIVISION [facility] [address] [place] [address] Dept: [phone] Loc: [phone]  GENEVER FISSEL  06/25/2018  You are scheduled for a Cardiac Catheterization on Monday, May 18 with Dr. Nanetta Batty.  1. Please arrive at the [facility] (Main Entrance A) at [facility]: [address] [place], [place] [address] at 5:30 AM (This time is two hours before your procedure to ensure your preparation). Free valet parking service is available.   Special note: Every effort is made to have your procedure done on time. Please understand that emergencies sometimes delay scheduled procedures.  2. Diet: Do not eat solid foods after midnight.  The patient may have clear liquids until 5am upon the day of the procedure.  3. Labs: You will need to have blood drawn on Friday, May 15 at [facility] [address], [place]  Open: 8am - 5pm (Lunch 12:30 - 1:30)   Phone: [phone]. You do not need to be fasting. You do not need an appointment. Please bring your lab slips with you. -OR- Go to [facility] for blood work  4. Medication instructions in preparation for your procedure:   Contrast Allergy: Yes, Please take Prednisone 50mg  by mouth at: Thirteen hours prior to cath 5:00pm on Sunday Seven hours prior to cath 11:00pm on Sunday And prior to leaving home please take last dose of Prednisone 50mg . (I have sent this prescription to your pharmacy)   On the morning of your procedure, take your Aspirin and any morning medicines NOT listed above.  You may use sips of water.  5. Plan for one night stay--bring personal belongings. 6. Bring a current list of your medications and current insurance cards. 7. You MUST have a responsible person to drive you home. 8. Someone MUST be with you the first 24 hours after you arrive home or your discharge will be delayed. 9. Please wear clothes  that are easy to get on and off and wear slip-on shoes.  Thank you for allowing [place] to care for you!   -- [facility]   You have been scheduled for a follow-up appointment with Dr. Royann Shivers on Thursday, August 13 at 4:20 PM.

## 2018-06-26 NOTE — Telephone Encounter (Signed)
Already spoke to patient see previous 06/26/18 telephone note.

## 2018-07-04 ENCOUNTER — Telehealth: Payer: Self-pay | Admitting: *Deleted

## 2018-07-04 ENCOUNTER — Telehealth: Payer: Self-pay | Admitting: Physician Assistant

## 2018-07-04 ENCOUNTER — Inpatient Hospital Stay (HOSPITAL_COMMUNITY): Admission: RE | Admit: 2018-07-04 | Payer: Medicare Other | Source: Ambulatory Visit

## 2018-07-04 NOTE — Telephone Encounter (Signed)
Pt contacted pre-catheterization scheduled at [facility] for: Monday Jul 08, 2018 7:30 AM Verified arrival time and place: [facility] Main Entrance A at: 5:30 AM Covid-19 07/05/18 [facility]  No solid food after midnight prior to cath, clear liquids until 5 AM day of procedure.  Contrast allergy: yes -13 hour prednisone and benadryl prep Prednisone 50 mg 07/07/18 6:30 PM Prednisone 50 mg 07/08/18 12:30 AM Prednisone 50 mg just prior to going to hospital for procedure.   AM meds can be  taken pre-cath with sip of water including: ASA 81 mg  Confirmed patient has responsible person to drive home post procedure and observe 24 hours after arriving home: yes   Pt advised due to Covid-19 pandemic, [facility] is restricting visitors and only patients should present for check-in prior to their procedure. People will not be allowed to enter [facility] with the patient. At this time [facility] is not allowing visitors to all [facility] campuses.    Per Dr Cato Mulligan Daily Covid Update 07/02/18: Universal masks: Effective immediately, we are requiring all ED patients and all ambulatory care patients to wear a universal mask, which we will provide. In-patients will have a mask at their bedside and will only be asked to put it on when a caregiver comes into their room.      COVID-19 Pre-Screening Questions:  . In the past 7 to 10 days have you had a cough,  shortness of breath, headache, congestion, fever, body aches, chills, sore throat, or sudden loss of taste or sense of smell? no . Have you been around anyone with known Covid 19? no . Have you been around anyone who is awaiting Covid 19 test results in the past 7 to 10 days? no . Have you been around anyone who has been exposed to Covid 19, or has mentioned symptoms of Covid 19 within the past 7 to 10 days? no  If you have any concerns about symptoms your patients report please contact your leadership team, or the provider the patient is  seeing in the office for further guidance.

## 2018-07-04 NOTE — Telephone Encounter (Signed)
Spoke with pt's husband who state he would like to have pt's lab and COVID test done on the same day. Advised pt that he could bring pt to our office for lab work on the same day. Informed husband that pt was scheduled to have COVID test today at 1 pm. Husband became upset and said he told Nurse that he couldn't come today. COVID test rescheduled for tomorrow 5/15 at 1:45 pm.

## 2018-07-04 NOTE — Telephone Encounter (Signed)
Patient's husband is calling because they would like to ask some questions regarding her catheterization tomorrow. He states that she is not feeling well and they need to get lab work done. He wants to know if they can go earlier to get her blood work done at [facility] or do they have to do it at her appt time.

## 2018-07-05 ENCOUNTER — Other Ambulatory Visit (HOSPITAL_COMMUNITY)
Admission: RE | Admit: 2018-07-05 | Discharge: 2018-07-05 | Disposition: A | Discharge status: Home / Self Care | Payer: Medicare Other | Source: Ambulatory Visit | Attending: Cardiovascular Disease | Admitting: Cardiovascular Disease

## 2018-07-05 DIAGNOSIS — Z1159 Encounter for screening for other viral diseases: Secondary | ICD-10-CM | POA: Insufficient documentation

## 2018-07-05 LAB — SARS CORONAVIRUS 2 BY RT PCR (HOSPITAL ORDER, PERFORMED IN ~~LOC~~ HOSPITAL LAB): SARS Coronavirus 2: NEGATIVE

## 2018-07-06 LAB — BASIC METABOLIC PANEL
BUN/Creatinine Ratio: 21 (ref 12–28)
BUN: 17 mg/dL (ref 8–27)
CO2: 23 mmol/L (ref 20–29)
Calcium: 9.8 mg/dL (ref 8.7–10.3)
Chloride: 104 mmol/L (ref 96–106)
Creatinine, Ser: 0.82 mg/dL (ref 0.57–1.00)
GFR calc Af Amer: 75 mL/min/{1.73_m2} (ref >59)
GFR calc non Af Amer: 65 mL/min/{1.73_m2} (ref >59)
Glucose: 84 mg/dL (ref 65–99)
Potassium: 4.6 mmol/L (ref 3.5–5.2)
Sodium: 139 mmol/L (ref 134–144)

## 2018-07-06 LAB — CBC
Hematocrit: 35.4 % (ref 34.0–46.6)
Hemoglobin: 12 g/dL (ref 11.1–15.9)
MCH: 31.2 pg (ref 26.6–33.0)
MCHC: 33.9 g/dL (ref 31.5–35.7)
MCV: 92 fL (ref 79–97)
Platelets: 251 10*3/uL (ref 150–450)
RBC: 3.85 x10E6/uL (ref 3.77–5.28)
RDW: 13.9 % (ref 11.7–15.4)
WBC: 9 10*3/uL (ref 3.4–10.8)

## 2018-07-08 ENCOUNTER — Inpatient Hospital Stay (HOSPITAL_COMMUNITY): Payer: Medicare Other

## 2018-07-08 ENCOUNTER — Inpatient Hospital Stay (HOSPITAL_COMMUNITY)
Admission: RE | Admit: 2018-07-08 | Discharge: 2018-07-19 | DRG: 234 | Disposition: A | Discharge status: Skilled Nursing Facility | Payer: Medicare Other | Attending: Surgery | Admitting: Surgery

## 2018-07-08 ENCOUNTER — Encounter (HOSPITAL_COMMUNITY): Payer: Self-pay | Admitting: Cardiovascular Disease

## 2018-07-08 ENCOUNTER — Telehealth: Payer: Self-pay | Admitting: Cardiovascular Disease

## 2018-07-08 ENCOUNTER — Inpatient Hospital Stay (HOSPITAL_COMMUNITY)
Admission: RE | Disposition: A | Discharge status: Skilled Nursing Facility | Payer: Self-pay | Source: Home / Self Care | Attending: Surgery

## 2018-07-08 ENCOUNTER — Other Ambulatory Visit: Payer: Self-pay

## 2018-07-08 DIAGNOSIS — B370 Candidal stomatitis: Secondary | ICD-10-CM | POA: Diagnosis not present

## 2018-07-08 DIAGNOSIS — E785 Hyperlipidemia, unspecified: Secondary | ICD-10-CM | POA: Diagnosis present

## 2018-07-08 DIAGNOSIS — Z951 Presence of aortocoronary bypass graft: Secondary | ICD-10-CM | POA: Diagnosis not present

## 2018-07-08 DIAGNOSIS — G4440 Drug-induced headache, not elsewhere classified, not intractable: Secondary | ICD-10-CM | POA: Diagnosis not present

## 2018-07-08 DIAGNOSIS — R109 Unspecified abdominal pain: Secondary | ICD-10-CM

## 2018-07-08 DIAGNOSIS — I2510 Atherosclerotic heart disease of native coronary artery without angina pectoris: Secondary | ICD-10-CM

## 2018-07-08 DIAGNOSIS — Z7982 Long term (current) use of aspirin: Secondary | ICD-10-CM

## 2018-07-08 DIAGNOSIS — Z888 Allergy status to other drugs, medicaments and biological substances status: Secondary | ICD-10-CM

## 2018-07-08 DIAGNOSIS — R1310 Dysphagia, unspecified: Secondary | ICD-10-CM | POA: Diagnosis not present

## 2018-07-08 DIAGNOSIS — R54 Age-related physical debility: Secondary | ICD-10-CM | POA: Diagnosis present

## 2018-07-08 DIAGNOSIS — I10 Essential (primary) hypertension: Secondary | ICD-10-CM | POA: Diagnosis present

## 2018-07-08 DIAGNOSIS — I480 Paroxysmal atrial fibrillation: Secondary | ICD-10-CM | POA: Diagnosis not present

## 2018-07-08 DIAGNOSIS — R079 Chest pain, unspecified: Secondary | ICD-10-CM | POA: Diagnosis present

## 2018-07-08 DIAGNOSIS — Z825 Family history of asthma and other chronic lower respiratory diseases: Secondary | ICD-10-CM

## 2018-07-08 DIAGNOSIS — T463X5A Adverse effect of coronary vasodilators, initial encounter: Secondary | ICD-10-CM | POA: Diagnosis not present

## 2018-07-08 DIAGNOSIS — Y92230 Patient room in hospital as the place of occurrence of the external cause: Secondary | ICD-10-CM | POA: Diagnosis not present

## 2018-07-08 DIAGNOSIS — J9811 Atelectasis: Secondary | ICD-10-CM | POA: Diagnosis not present

## 2018-07-08 DIAGNOSIS — K5900 Constipation, unspecified: Secondary | ICD-10-CM | POA: Diagnosis present

## 2018-07-08 DIAGNOSIS — E039 Hypothyroidism, unspecified: Secondary | ICD-10-CM | POA: Diagnosis present

## 2018-07-08 DIAGNOSIS — Z91013 Allergy to seafood: Secondary | ICD-10-CM

## 2018-07-08 DIAGNOSIS — I371 Nonrheumatic pulmonary valve insufficiency: Secondary | ICD-10-CM

## 2018-07-08 DIAGNOSIS — M1990 Unspecified osteoarthritis, unspecified site: Secondary | ICD-10-CM | POA: Diagnosis present

## 2018-07-08 DIAGNOSIS — Z01810 Encounter for preprocedural cardiovascular examination: Secondary | ICD-10-CM | POA: Diagnosis not present

## 2018-07-08 DIAGNOSIS — D62 Acute posthemorrhagic anemia: Secondary | ICD-10-CM | POA: Diagnosis not present

## 2018-07-08 DIAGNOSIS — Z91048 Other nonmedicinal substance allergy status: Secondary | ICD-10-CM

## 2018-07-08 DIAGNOSIS — I25110 Atherosclerotic heart disease of native coronary artery with unstable angina pectoris: Secondary | ICD-10-CM

## 2018-07-08 DIAGNOSIS — Z1159 Encounter for screening for other viral diseases: Secondary | ICD-10-CM | POA: Diagnosis not present

## 2018-07-08 DIAGNOSIS — H409 Unspecified glaucoma: Secondary | ICD-10-CM | POA: Diagnosis present

## 2018-07-08 DIAGNOSIS — F419 Anxiety disorder, unspecified: Secondary | ICD-10-CM | POA: Diagnosis present

## 2018-07-08 DIAGNOSIS — E8779 Other fluid overload: Secondary | ICD-10-CM | POA: Diagnosis not present

## 2018-07-08 DIAGNOSIS — Z823 Family history of stroke: Secondary | ICD-10-CM

## 2018-07-08 DIAGNOSIS — D6959 Other secondary thrombocytopenia: Secondary | ICD-10-CM | POA: Diagnosis not present

## 2018-07-08 DIAGNOSIS — I2584 Coronary atherosclerosis due to calcified coronary lesion: Secondary | ICD-10-CM | POA: Diagnosis present

## 2018-07-08 DIAGNOSIS — Z79899 Other long term (current) drug therapy: Secondary | ICD-10-CM

## 2018-07-08 DIAGNOSIS — K219 Gastro-esophageal reflux disease without esophagitis: Secondary | ICD-10-CM | POA: Diagnosis present

## 2018-07-08 DIAGNOSIS — E876 Hypokalemia: Secondary | ICD-10-CM | POA: Diagnosis not present

## 2018-07-08 DIAGNOSIS — E7800 Pure hypercholesterolemia, unspecified: Secondary | ICD-10-CM | POA: Diagnosis not present

## 2018-07-08 DIAGNOSIS — Z79890 Hormone replacement therapy (postmenopausal): Secondary | ICD-10-CM

## 2018-07-08 DIAGNOSIS — Z8249 Family history of ischemic heart disease and other diseases of the circulatory system: Secondary | ICD-10-CM

## 2018-07-08 DIAGNOSIS — Z885 Allergy status to narcotic agent status: Secondary | ICD-10-CM

## 2018-07-08 DIAGNOSIS — I9789 Other postprocedural complications and disorders of the circulatory system, not elsewhere classified: Secondary | ICD-10-CM | POA: Diagnosis not present

## 2018-07-08 DIAGNOSIS — K449 Diaphragmatic hernia without obstruction or gangrene: Secondary | ICD-10-CM | POA: Diagnosis present

## 2018-07-08 DIAGNOSIS — Z7952 Long term (current) use of systemic steroids: Secondary | ICD-10-CM

## 2018-07-08 DIAGNOSIS — Z91041 Radiographic dye allergy status: Secondary | ICD-10-CM

## 2018-07-08 DIAGNOSIS — I4891 Unspecified atrial fibrillation: Secondary | ICD-10-CM | POA: Diagnosis not present

## 2018-07-08 DIAGNOSIS — Z09 Encounter for follow-up examination after completed treatment for conditions other than malignant neoplasm: Secondary | ICD-10-CM

## 2018-07-08 HISTORY — PX: LEFT HEART CATH AND CORONARY ANGIOGRAPHY: CATH118249

## 2018-07-08 HISTORY — DX: Atherosclerotic heart disease of native coronary artery with unstable angina pectoris: I25.110

## 2018-07-08 LAB — TROPONIN I
Troponin I: <0.03 ng/mL (ref <0.03)
Troponin I: 0.03 ng/mL (ref <0.03)
Troponin I: 0.03 ng/mL (ref <0.03)

## 2018-07-08 LAB — ECHOCARDIOGRAM COMPLETE
Height: 64 in
Weight: 1856 oz

## 2018-07-08 LAB — GLUCOSE, CAPILLARY: Glucose-Capillary: 177 mg/dL — ABNORMAL HIGH (ref 70–99)

## 2018-07-08 SURGERY — LEFT HEART CATH AND CORONARY ANGIOGRAPHY
Anesthesia: LOCAL

## 2018-07-08 MED ORDER — SODIUM CHLORIDE 0.9% FLUSH: 3.0000 mL | INTRAVENOUS | Status: DC | PRN

## 2018-07-08 MED ORDER — ONDANSETRON HCL 4 MG PO TABS: 8.0000 mg | ORAL_TABLET | Freq: Two times a day (BID) | ORAL | Status: DC | PRN

## 2018-07-08 MED ORDER — PANTOPRAZOLE SODIUM 40 MG PO TBEC
40.0000 mg | DELAYED_RELEASE_TABLET | Freq: Every day | ORAL | Status: DC
  Administered 2018-07-08 – 2018-07-09 (×2): 40 mg via ORAL
  Filled 2018-07-08: qty 1

## 2018-07-08 MED ORDER — LORAZEPAM 2 MG/ML IJ SOLN
INTRAMUSCULAR | Status: AC
  Administered 2018-07-08: 1 mg via INTRAVENOUS
  Filled 2018-07-08: qty 1

## 2018-07-08 MED ORDER — SODIUM CHLORIDE 0.9 % IV SOLN
250.0000 mL | INTRAVENOUS | Status: DC | PRN
  Administered 2018-07-08: 250 mL via INTRAVENOUS

## 2018-07-08 MED ORDER — NITROGLYCERIN 0.4 MG SL SUBL
SUBLINGUAL_TABLET | SUBLINGUAL | Status: AC
  Administered 2018-07-08: 0.4 mg via SUBLINGUAL
  Filled 2018-07-08: qty 1

## 2018-07-08 MED ORDER — VERAPAMIL HCL 2.5 MG/ML IV SOLN
INTRAVENOUS | Status: DC | PRN
  Administered 2018-07-08: 10 mL via INTRA_ARTERIAL

## 2018-07-08 MED ORDER — HYDRALAZINE HCL 20 MG/ML IJ SOLN
10.0000 mg | INTRAMUSCULAR | Status: AC | PRN
  Administered 2018-07-08 (×2): 10 mg via INTRAVENOUS

## 2018-07-08 MED ORDER — SODIUM CHLORIDE 0.9 % WEIGHT BASED INFUSION
3.0000 mL/kg/h | INTRAVENOUS | Status: DC
  Administered 2018-07-08: 3 mL/kg/h via INTRAVENOUS

## 2018-07-08 MED ORDER — NITROGLYCERIN IN D5W 200-5 MCG/ML-% IV SOLN
0.0000 ug/min | INTRAVENOUS | Status: DC
  Administered 2018-07-08: 10 ug/min via INTRAVENOUS
  Administered 2018-07-09: 5 ug/min via INTRAVENOUS

## 2018-07-08 MED ORDER — DOCUSATE SODIUM 100 MG PO CAPS: 100.0000 mg | ORAL_CAPSULE | Freq: Every day | ORAL | Status: DC | PRN

## 2018-07-08 MED ORDER — SODIUM CHLORIDE 0.9% FLUSH: 3.0000 mL | Freq: Two times a day (BID) | INTRAVENOUS | Status: DC

## 2018-07-08 MED ORDER — IOHEXOL 350 MG/ML SOLN
INTRAVENOUS | Status: DC | PRN
  Administered 2018-07-08: 70 mL via INTRA_ARTERIAL

## 2018-07-08 MED ORDER — NITROGLYCERIN 0.4 MG SL SUBL
SUBLINGUAL_TABLET | SUBLINGUAL | Status: DC | PRN
  Administered 2018-07-08: .4 mg via SUBLINGUAL

## 2018-07-08 MED ORDER — VERAPAMIL HCL 2.5 MG/ML IV SOLN
INTRAVENOUS | Status: AC
  Filled 2018-07-08: qty 2

## 2018-07-08 MED ORDER — NITROGLYCERIN 0.4 MG SL SUBL
SUBLINGUAL_TABLET | SUBLINGUAL | Status: AC
  Filled 2018-07-08: qty 1

## 2018-07-08 MED ORDER — LORAZEPAM 1 MG PO TABS
1.0000 mg | ORAL_TABLET | Freq: Every day | ORAL | Status: DC
  Administered 2018-07-08 – 2018-07-09 (×2): 1 mg via ORAL
  Filled 2018-07-08 (×2): qty 1

## 2018-07-08 MED ORDER — ACETAMINOPHEN 325 MG PO TABS
ORAL_TABLET | ORAL | Status: AC
  Filled 2018-07-08: qty 2

## 2018-07-08 MED ORDER — NITROGLYCERIN IN D5W 200-5 MCG/ML-% IV SOLN
INTRAVENOUS | Status: AC
  Filled 2018-07-08: qty 250

## 2018-07-08 MED ORDER — LORAZEPAM 2 MG/ML IJ SOLN
1.0000 mg | Freq: Once | INTRAMUSCULAR | Status: AC
  Administered 2018-07-08: 1 mg via INTRAVENOUS

## 2018-07-08 MED ORDER — FENTANYL CITRATE (PF) 100 MCG/2ML IJ SOLN
INTRAMUSCULAR | Status: AC
  Filled 2018-07-08: qty 2

## 2018-07-08 MED ORDER — ASPIRIN 81 MG PO CHEW
81.0000 mg | CHEWABLE_TABLET | Freq: Every day | ORAL | Status: DC
  Administered 2018-07-09: 81 mg via ORAL
  Filled 2018-07-08 (×2): qty 1

## 2018-07-08 MED ORDER — HEPARIN (PORCINE) IN NACL 1000-0.9 UT/500ML-% IV SOLN
INTRAVENOUS | Status: DC | PRN
  Administered 2018-07-08: 500 mL

## 2018-07-08 MED ORDER — ACETAMINOPHEN 325 MG PO TABS
650.0000 mg | ORAL_TABLET | ORAL | Status: DC | PRN
  Administered 2018-07-08 – 2018-07-09 (×2): 650 mg via ORAL
  Filled 2018-07-08: qty 2

## 2018-07-08 MED ORDER — LIDOCAINE HCL (PF) 1 % IJ SOLN
INTRAMUSCULAR | Status: AC
  Filled 2018-07-08: qty 30

## 2018-07-08 MED ORDER — LABETALOL HCL 5 MG/ML IV SOLN: 10.0000 mg | INTRAVENOUS | Status: AC | PRN

## 2018-07-08 MED ORDER — HEPARIN SODIUM (PORCINE) 1000 UNIT/ML IJ SOLN
INTRAMUSCULAR | Status: DC | PRN
  Administered 2018-07-08: 2500 [IU] via INTRAVENOUS

## 2018-07-08 MED ORDER — ONDANSETRON HCL 4 MG/2ML IJ SOLN: 4.0000 mg | Freq: Four times a day (QID) | INTRAMUSCULAR | Status: DC | PRN

## 2018-07-08 MED ORDER — FENTANYL CITRATE (PF) 100 MCG/2ML IJ SOLN
25.0000 ug | Freq: Once | INTRAMUSCULAR | Status: AC
  Administered 2018-07-08: 25 ug via INTRAVENOUS
  Filled 2018-07-08: qty 2

## 2018-07-08 MED ORDER — FENTANYL CITRATE (PF) 100 MCG/2ML IJ SOLN
INTRAMUSCULAR | Status: DC | PRN
  Administered 2018-07-08: 25 ug via INTRAVENOUS

## 2018-07-08 MED ORDER — SODIUM CHLORIDE 0.9% FLUSH
3.0000 mL | Freq: Two times a day (BID) | INTRAVENOUS | Status: DC
  Administered 2018-07-08 – 2018-07-09 (×4): 3 mL via INTRAVENOUS

## 2018-07-08 MED ORDER — HEPARIN SODIUM (PORCINE) 1000 UNIT/ML IJ SOLN
INTRAMUSCULAR | Status: AC
  Filled 2018-07-08: qty 1

## 2018-07-08 MED ORDER — MAGNESIUM OXIDE 400 (241.3 MG) MG PO TABS
400.0000 mg | ORAL_TABLET | Freq: Every day | ORAL | Status: DC
  Administered 2018-07-08 – 2018-07-09 (×2): 400 mg via ORAL
  Filled 2018-07-08 (×2): qty 1

## 2018-07-08 MED ORDER — ALPRAZOLAM 0.25 MG PO TABS
0.2500 mg | ORAL_TABLET | Freq: Two times a day (BID) | ORAL | Status: DC | PRN
  Filled 2018-07-08: qty 1

## 2018-07-08 MED ORDER — SODIUM CHLORIDE 0.9 % IV SOLN: 250.0000 mL | INTRAVENOUS | Status: DC | PRN

## 2018-07-08 MED ORDER — LIDOCAINE HCL (PF) 1 % IJ SOLN
INTRAMUSCULAR | Status: DC | PRN
  Administered 2018-07-08: 2 mL

## 2018-07-08 MED ORDER — MIDAZOLAM HCL 2 MG/2ML IJ SOLN
INTRAMUSCULAR | Status: AC
  Filled 2018-07-08: qty 2

## 2018-07-08 MED ORDER — HYDRALAZINE HCL 20 MG/ML IJ SOLN
INTRAMUSCULAR | Status: AC
  Filled 2018-07-08: qty 1

## 2018-07-08 MED ORDER — NITROGLYCERIN 0.4 MG SL SUBL
0.4000 mg | SUBLINGUAL_TABLET | SUBLINGUAL | Status: DC | PRN
  Administered 2018-07-08 (×2): 0.4 mg via SUBLINGUAL

## 2018-07-08 MED ORDER — HEPARIN (PORCINE) IN NACL 1000-0.9 UT/500ML-% IV SOLN
INTRAVENOUS | Status: AC
  Filled 2018-07-08: qty 1000

## 2018-07-08 MED ORDER — MIDAZOLAM HCL 2 MG/2ML IJ SOLN
INTRAMUSCULAR | Status: DC | PRN
  Administered 2018-07-08: 0.5 mg via INTRAVENOUS

## 2018-07-08 MED ORDER — LISINOPRIL 10 MG PO TABS
10.0000 mg | ORAL_TABLET | Freq: Every day | ORAL | Status: DC
  Administered 2018-07-09: 10 mg via ORAL
  Filled 2018-07-08: qty 1

## 2018-07-08 MED ORDER — SODIUM CHLORIDE 0.9 % IV SOLN
INTRAVENOUS | Status: AC
  Administered 2018-07-08: 09:00:00 via INTRAVENOUS

## 2018-07-08 MED ORDER — SODIUM CHLORIDE 0.9 % WEIGHT BASED INFUSION: 1.0000 mL/kg/h | INTRAVENOUS | Status: DC

## 2018-07-08 MED ORDER — ASPIRIN 81 MG PO TABS: 81.0000 mg | ORAL_TABLET | Freq: Every day | ORAL | Status: DC

## 2018-07-08 MED ORDER — SIMVASTATIN 20 MG PO TABS
20.0000 mg | ORAL_TABLET | Freq: Every day | ORAL | Status: DC
  Administered 2018-07-08 – 2018-07-15 (×7): 20 mg via ORAL
  Filled 2018-07-08 (×7): qty 1

## 2018-07-08 MED ORDER — ASPIRIN 81 MG PO CHEW: 81.0000 mg | CHEWABLE_TABLET | ORAL | Status: DC

## 2018-07-08 MED ORDER — NITROGLYCERIN 1 MG/10 ML FOR IR/CATH LAB
INTRA_ARTERIAL | Status: AC
  Filled 2018-07-08: qty 10

## 2018-07-08 MED ORDER — LEVOTHYROXINE SODIUM 88 MCG PO TABS
88.0000 ug | ORAL_TABLET | Freq: Every day | ORAL | Status: DC
  Administered 2018-07-09 – 2018-07-19 (×10): 88 ug via ORAL
  Filled 2018-07-08 (×11): qty 1

## 2018-07-08 SURGICAL SUPPLY — 12 items
CATH INFINITI 5FR ANG PIGTAIL (CATHETERS) ×1 IMPLANT
CATH OPTITORQUE TIG 4.0 5F (CATHETERS) ×1 IMPLANT
DEVICE RAD TR BAND REGULAR (VASCULAR PRODUCTS) ×1 IMPLANT
GLIDESHEATH SLEND A-KIT 6F 22G (SHEATH) ×2 IMPLANT
GUIDEWIRE INQWIRE 1.5J.035X260 (WIRE) IMPLANT
INQWIRE 1.5J .035X260CM (WIRE) ×2
KIT HEART LEFT (KITS) ×2 IMPLANT
PACK CARDIAC CATHETERIZATION (CUSTOM PROCEDURE TRAY) ×2 IMPLANT
SYR MEDRAD MARK 7 150ML (SYRINGE) ×2 IMPLANT
TRANSDUCER W/STOPCOCK (MISCELLANEOUS) ×2 IMPLANT
TUBING CIL FLEX 10 FLL-RA (TUBING) ×2 IMPLANT
WIRE HI TORQ VERSACORE-J 145CM (WIRE) ×1 IMPLANT

## 2018-07-08 NOTE — Telephone Encounter (Signed)
New Message    Pts daughter is calling about updates from her mothers current admission    Please call

## 2018-07-08 NOTE — Consult Note (Signed)
[address]       [place] [id number]             [phone]        Leah Pittman [facility] Medical Record #[id number] Date of Birth: 12-Mar-1930  Referring: Dr. Allyson SabalBerry, MD Primary Care: Blane Oharaox, [name], MD Primary Cardiologist:Mihai Croitoru, MD  Chief Complaint:   Chest pain/heaviness Reason for consultation: Coronary artery disease  History of Present Illness:     This is an 85 year old female, who appears younger than her age, with a past medical history of hypertension,  hyperlipidemia, glaucoma, and hypothyroidism who has had complaints of chest, chest heaviness since March 2020. She has had these episodes several times per week and happen at rest and with exertion. Associated with this has been nausea, dyspnea on exertion, fatigue, swelling in her feet and toes. She had a gated cardiac CT done on 05/06/2018 and results showed severe stenosis in the proximal LAD and left Circumflex arteries. She was seen by cardiology on May 5th and it was decided to proceed with a cardiac catheterization. She was admitted to [facility] on 05/18 in order to undergo a cardiac catheterization.  Results showed proximal to mid LAD stenosis 90%, ostial LM 75% stenosis, proximal to mid Circumflex 95% stenosis and LVEF 65%. A cardiothoracic consultation has been requested with Dr. Laneta SimmersBartle. Currently, she is eating lunch. She was given Lorazepam around 11:30 am for anxiety. She denies chest pain or shortness of breath and vital signs are stable.  Current Activity/ Functional Status: Patient is independent with mobility/ambulation, transfers, ADL's, IADL's.   Zubrod Score: At the time of surgery this patient's most appropriate activity status/level should be described as: []     0    Normal activity, no symptoms [x]     1    Restricted in physical strenuous activity but ambulatory, able to do out light work []     2    Ambulatory and capable of self care, unable to do work activities, up  and about more than 50%  Of the time                            []     3    Only limited self care, in bed greater than 50% of waking hours []     4    Completely disabled, no self care, confined to bed or chair []     5    Moribund  Past Medical History:  Diagnosis Date  . Anxiety   . Arthritis   . Chest pain 2011   CARDIOLITE - no significant symptoms, EKG changes, or arrhythmias  . Dyspnea 02/16/2012   2D ECHO - EF 55-60%, normal  . Fatigue   . Glaucoma   . Headache(784.0)   . Hiatal hernia   . High blood pressure 02/16/2012   RENAL DOPPLER - normal  . Hyperlipidemia   . Hypothyroidism   . Labile hypertension   . Lightheadedness   . Migraine headache   . Multiple allergies   . Myalgia   . Pain, lower leg    Calf  . Problem of menstruation   . Reflux   . SOB (shortness of breath)   . Thyroid disease   . Urinary problem     Past Surgical History:  Procedure Laterality Date  . ABDOMINAL HYSTERECTOMY  1976  . CHOLECYSTECTOMY  2000  . CYSTECTOMY  1974  Intestine  . CYSTECTOMY  1996   Brain stem  . EYE SURGERY  2003  . LEFT HEART CATH AND CORONARY ANGIOGRAPHY N/A 07/08/2018   Procedure: LEFT HEART CATH AND CORONARY ANGIOGRAPHY;  Surgeon: Runell Gess, MD;  Location: [facility] INVASIVE CV LAB;  Service: Cardiovascular;  Laterality: N/A;    Social History   Tobacco Use  Smoking Status Never Smoker  Smokeless Tobacco Never Used    Social History   Substance and Sexual Activity  Alcohol Use No     Allergies  Allergen Reactions  . Benadryl [Diphenhydramine] Swelling and Other (See Comments)    Tongue swells and hallucinates- could not talk  . Codeine Nausea And Vomiting and Hypertension  . Meperidine Nausea Only, Swelling and Other (See Comments)    Tongue swells and "I feel like death"  . Prednisone     Hypertension   . Promethazine Other (See Comments)    Hypotension   . Propranolol Nausea And Vomiting  . Shellfish Allergy Nausea And Vomiting  . Tape  Other (See Comments)    Tears the skin  . Tazarotene Other (See Comments)    Reaction not recalled   . Iodinated Diagnostic Agents Nausea Only and Rash    Current Facility-Administered Medications  Medication Dose Route Frequency Provider Last Rate Last Dose  . 0.9 %  sodium chloride infusion   Intravenous Continuous Runell Gess, MD   Stopped at 07/08/18 1230  . 0.9 %  sodium chloride infusion  250 mL Intravenous PRN Runell Gess, MD 10 mL/hr at 07/08/18 1232 250 mL at 07/08/18 1232  . acetaminophen (TYLENOL) tablet 650 mg  650 mg Oral Q4H PRN Runell Gess, MD   650 mg at 07/08/18 0936  . ALPRAZolam Prudy Feeler) tablet 0.25 mg  0.25 mg Oral BID PRN Azalee Course, PA      . aspirin chewable tablet 81 mg  81 mg Oral Daily Runell Gess, MD      . docusate sodium (COLACE) capsule 100 mg  100 mg Oral Daily PRN Runell Gess, MD      . hydrALAZINE (APRESOLINE) injection 10 mg  10 mg Intravenous Q20 Min PRN Runell Gess, MD   10 mg at 07/08/18 0949  . labetalol (NORMODYNE) injection 10 mg  10 mg Intravenous Q10 min PRN Runell Gess, MD      . Melene Muller ON 07/09/2018] levothyroxine (SYNTHROID) tablet 88 mcg  88 mcg Oral QAC breakfast Runell Gess, MD      . Melene Muller ON 07/09/2018] lisinopril (ZESTRIL) tablet 10 mg  10 mg Oral Daily Runell Gess, MD      . LORazepam (ATIVAN) tablet 1 mg  1 mg Oral QHS Runell Gess, MD      . magnesium oxide (MAG-OX) tablet 400 mg  400 mg Oral Daily Runell Gess, MD   400 mg at 07/08/18 1211  . nitroGLYCERIN (NITROSTAT) SL tablet 0.4 mg  0.4 mg Sublingual Q5 min PRN Runell Gess, MD   0.4 mg at 07/08/18 1028  . nitroGLYCERIN 50 mg in dextrose 5 % 250 mL (0.2 mg/mL) infusion  0-200 mcg/min Intravenous Titrated Azalee Course, PA 3 mL/hr at 07/08/18 1140 10 mcg/min at 07/08/18 1140  . ondansetron (ZOFRAN) injection 4 mg  4 mg Intravenous Q6H PRN Runell Gess, MD      . ondansetron [facility]) tablet 8 mg  8 mg Oral BID PRN Runell Gess, MD      .  pantoprazole (PROTONIX) EC tablet 40 mg  40 mg Oral Daily Azalee Course, [place]   40 mg at 07/08/18 1210  . simvastatin (ZOCOR) tablet 20 mg  20 mg Oral Q supper Runell Gess, MD      . sodium chloride flush (NS) 0.9 % injection 3 mL  3 mL Intravenous Q12H Runell Gess, MD   3 mL at 07/08/18 1214  . sodium chloride flush (NS) 0.9 % injection 3 mL  3 mL Intravenous PRN Runell Gess, MD        Medications Prior to Admission  Medication Sig Dispense Refill Last Dose  . acetaminophen (TYLENOL) 500 MG tablet Take 500 mg by mouth every 8 (eight) hours as needed for mild pain or headache.    Past Month at Unknown time  . alum & mag hydroxide-simeth (MAALOX/MYLANTA) 200-200-20 MG/5ML suspension Take 30 mLs by mouth every 6 (six) hours as needed for indigestion or heartburn.   Past Week at Unknown time  . aspirin 81 MG tablet Take 81 mg by mouth daily.   07/08/2018 at 0430  . Calcium Citrate-Vitamin D (CALCIUM CITRATE+D3 PETITES PO) Take 1 tablet by mouth daily.   07/07/2018 at Unknown time  . carboxymethylcellulose (REFRESH TEARS) 0.5 % SOLN Place 1-2 drops into both eyes as needed (for dryness or redness).   07/07/2018 at Unknown time  . docusate sodium (COLACE) 100 MG capsule Take 100 mg by mouth daily as needed for mild constipation.   Past Week at Unknown time  . dorzolamide-timolol (COSOPT) 22.3-6.8 MG/ML ophthalmic solution Place 1 drop into both eyes 2 (two) times daily.    07/08/2018 at 0430  . lisinopril (ZESTRIL) 20 MG tablet Take 10 mg by mouth daily.   07/08/2018 at 0630  . LORazepam (ATIVAN) 1 MG tablet Take 1 mg by mouth at bedtime.    07/07/2018 at Unknown time  . magnesium oxide (MAG-OX) 400 MG tablet Take 400 mg by mouth daily.   07/07/2018 at Unknown time  . nitroGLYCERIN (NITROSTAT) 0.4 MG SL tablet Place 0.4 mg under the tongue every 5 (five) minutes as needed for chest pain.    Past Month at Unknown time  . ondansetron (ZOFRAN) 8 MG tablet Take 8 mg by mouth 2  (two) times daily as needed for nausea or vomiting.   07/07/2018 at Unknown time  . pantoprazole (PROTONIX) 40 MG tablet Take 40 mg by mouth 2 (two) times a day.    07/07/2018 at Unknown time  . predniSONE (DELTASONE) 50 MG tablet Take 1 tablet at 5PM on Sunday (5/17); 1 tablet at 11PM on Sunday (5/17); and last tablet prior to leaving your house on Monday morning (5/18). 3 tablet 0 07/08/2018 at 0430  . simvastatin (ZOCOR) 20 MG tablet Take 20 mg by mouth daily with supper.    07/07/2018 at Unknown time  . SYNTHROID 88 MCG tablet Take 88 mcg by mouth daily before breakfast.   07/07/2018 at Unknown time  . Travoprost, BAK Free, (TRAVATAN Z) 0.004 % SOLN ophthalmic solution Place 1 drop into both eyes at bedtime.    07/07/2018 at Unknown time  . Vitamin D, Ergocalciferol, (DRISDOL) 1.25 MG (50000 UT) CAPS capsule Take 50,000 Units by mouth every Monday.    Past Week at Unknown time    Family History  Problem Relation Age of Onset  . Heart attack Father   . Stroke Father   . Cancer Sister   . Cancer Brother   . Asthma Brother   .  Cancer Brother   Patient is married and has 3 children (1 daughter and 2 sons who live locally). 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   Review of Systems:     Cardiac Review of Systems: Y or  [ N   ]= no  Chest Pain [ Y   ]  Exertional SOB  [ N ]  Orthopnea [ N ]  Pedal Edema [ N  ]    Syncope  Klaus.Mock  ]   Presyncope [ N  ]  General Review of Systems: [Y] = yes [N  ]=no Constitional:  fatigue [Y  ]; nausea [Y  ]; night sweats [ N ]; fever [ N ]; or chills Klaus.Mock  ]                                                                 Eye : blurred vision [Y  ]; glaucoma [ Y ];   Resp: cough [Y-mostly in the morning, has allergies  ];  wheezing[N  ];  hemoptysis[N  ];  GI:  Left sided abdominal pain/discomfort [Y],vomiting[ N ];   melena[ N ];  hematochezia [ N ];  recent  colonoscopy [Y] GU:  hematuria[ N ]; bladder suspension[ Y ];              Skin: rash, swelling[ N ];,  Musculosketetal: neck pain [ Y ];   joint pain[  ];  back pain[ N ]; feet neuropathy [Y]  Heme/Lymph: anemia[ N ];  Neuro: TIA[ N ];  headaches[ Y ];  stroke[ N ];  vertigo[N  ];  seizures[ N ];    Psych:anxiety[ Y ];  Endocrine: diabetes[N  ];  thyroid dysfunction[Y  ];              Physical Exam: BP (!) 117/46   Pulse 72   Temp 97.7 F (36.5 C) (Oral)   Resp 19   Ht 5\' 4"  (1.626 m)   Wt 52.6 kg   SpO2 99%   BMI 19.91 kg/m    General appearance: cooperative and no distress Head: Normocephalic, without obvious abnormality, atraumatic Resp: clear to auscultation bilaterally Cardio: RRR, no murmur GI: Soft, mild tenderness with deep palpation LLQ area, bowel sounds present Extremities: No LE edema;palpable pulses Neurologic: Grossly normal  Diagnostic Studies & Laboratory data: LEFT HEART CATH AND CORONARY ANGIOGRAPHY  Conclusion     Prox RCA to Mid RCA lesion is 40% stenosed.  Ost LM lesion is 75% stenosed.  Mid LM lesion is 40% stenosed.  Ost Cx to Prox Cx lesion is 50% stenosed.  Prox Cx to Mid Cx lesion is 95% stenosed.  Prox LAD to Mid LAD lesion is 90% stenosed.  The left ventricular systolic function is normal.  LV end diastolic pressure is normal.  The left ventricular ejection fraction is greater than 65% by visual estimate.   Leah Pittman is a 85 y.o. female    [id number] LOCATION:  FACILITY: [facility]  PHYSICIAN: Nanetta Batty, M.D. 04/20/1930   DATE OF PROCEDURE:  07/08/2018  Dominance: Right  Left Main  Ost LM lesion 75% stenosed  Ost LM lesion is 75% stenosed.  Mid  LM lesion 40% stenosed  Mid LM lesion is 40% stenosed.  Left Anterior Descending  Prox LAD to Mid LAD lesion 90% stenosed  Prox LAD to Mid LAD lesion is 90% stenosed.  Left Circumflex  Ost Cx to Prox Cx lesion 50% stenosed  Ost Cx to Prox Cx lesion is 50%  stenosed.  Prox Cx to Mid Cx lesion 95% stenosed  Prox Cx to Mid Cx lesion is 95% stenosed.  Right Coronary Artery  Prox RCA to Mid RCA lesion 40% stenosed  Prox RCA to Mid RCA lesion is 40% stenosed.  Intervention   No interventions have been documented.  Wall Motion   Resting               Left Heart   Left Ventricle The left ventricular size is normal. The left ventricular systolic function is normal. LV end diastolic pressure is normal. The left ventricular ejection fraction is greater than 65% by visual estimate. No regional wall motion abnormalities.  Coronary Diagrams   Diagnostic  Dominance: Right       Recent Radiology Findings:   Dg Chest Port 1 View  Result Date: 07/08/2018 CLINICAL DATA:  Chest pain. EXAM: PORTABLE CHEST 1 VIEW COMPARISON:  Radiograph of May 10, 2018. FINDINGS: The heart size and mediastinal contours are within normal limits. Both lungs are clear. No pneumothorax or pleural effusion is noted. Atherosclerosis of thoracic aorta is noted. The visualized skeletal structures are unremarkable. IMPRESSION: No active disease. Aortic Atherosclerosis (ICD10-I70.0). Electronically Signed   By: Lupita Raider M.D.   On: 07/08/2018 12:07   Dg Abd Portable 1v  Result Date: 07/08/2018 CLINICAL DATA:  Gastric pain. EXAM: PORTABLE ABDOMEN - 1 VIEW COMPARISON:  Radiograph of July 31, 2017. FINDINGS: No abnormal bowel dilatation is noted. Moderate stool is noted throughout the colon. No radio-opaque calculi or other significant radiographic abnormality are seen. IMPRESSION: Moderate stool burden.  No evidence of bowel obstruction or ileus. Electronically Signed   By: Lupita Raider M.D.   On: 07/08/2018 12:06     I have independently reviewed the above radiologic studies and discussed with the patient   Recent Lab Findings: Lab Results  Component Value Date   WBC 9.0 07/05/2018   HGB 12.0 07/05/2018   HCT 35.4 07/05/2018   PLT 251 07/05/2018   GLUCOSE 84  07/05/2018   ALT 26 05/10/2018   AST 24 05/10/2018   NA 139 07/05/2018   K 4.6 07/05/2018   CL 104 07/05/2018   CREATININE 0.82 07/05/2018   BUN 17 07/05/2018   CO2 23 07/05/2018   Assessment / Plan:   1. Coronary artery disease-On Nitro drip. Troponins being drawn. On CXR, she has atherosclerosis of thoracic aorta. Await echo results.  Dr. Laneta Simmers to evaluate for coronary artery bypass grafting surgery. She is at increased risk given her advanced age. 2. History of hyperlipidemia-on Simvastatin 20 mg at hs 3. History of hypertension-on Lisinopril 10 mg daily 4. History of hypothyroidism Levothyroxine 88 mcg daily   I  spent 20 minutes counseling the patient face to face.   Doree Fudge PA-C 07/08/2018 12:40 PM

## 2018-07-08 NOTE — Telephone Encounter (Signed)
Spoke with Daughter who was calling for update in regards to pt's current admission. Daughter state she just received a call from Dr. Salena Saner updating her with pt's status and thanked Nurse for returning her call.

## 2018-07-08 NOTE — Progress Notes (Signed)
TCTS consulted for CABG evaluation. °

## 2018-07-08 NOTE — Progress Notes (Signed)
  Echocardiogram 2D Echocardiogram has been performed.  Pieter Partridge 07/08/2018, 2:22 PM

## 2018-07-08 NOTE — Progress Notes (Addendum)
Paged by nurse to bedside as patient had 7/10 chest pain since cardiac catheterization. She also complained of significant abdominal pain which has been going on for several month now. After the cath today, she had a episode of nausea and vomiting and HR brady down to the 30s during the vomiting likely related to vagal stimulation. She did mention she had endoscopy and colonscopy recently. IV nitro started. EKG obtained which showed sinus rhythm without significant ST elevation, ST downsloping in lateral leads. Epigastric area tender to palpation. Patient had significant anxiety. Started protonix and PRN xanax. EKG reviewed with Dr. Royann Shivers. Chest pain improving. Will continue to monitor and wait for CT surgery to evaluate. I orderd CXR and abdominal  X ray.   Physical exam: Lung is clear. Heart regular.  Patient crying, appears to be uncomfortable.  Legs: no LE edema  Ramond Dial PA Pager: [phone]  Agree with findings. The repeat ECG is minimally changed. Discussed the new events with her husband and daughter. Not sure the NTG is helping her complaint, but it is useful for her elevated BP. Will try to gradually wean down to 10 mcg. Will continue her pre-surgical workup as an inpatient/  Thurmon Fair, MD, [facility] [phone] office [phone] pager

## 2018-07-08 NOTE — Interval H&P Note (Signed)
Cath Lab Visit (complete for each Cath Lab visit)  Clinical Evaluation Leading to the Procedure:   ACS: No.  Non-ACS:    Anginal Classification: CCS II  Anti-ischemic medical therapy: Maximal Therapy (2 or more classes of medications)  Non-Invasive Test Results: High-risk stress test findings: cardiac mortality >3%/year  Prior CABG: No previous CABG      History and Physical Interval Note:  07/08/2018 7:30 AM  Leah Pittman  has presented today for surgery, with the diagnosis of Chest Pain.  The various methods of treatment have been discussed with the patient and family. After consideration of risks, benefits and other options for treatment, the patient has consented to  Procedure(s): LEFT HEART CATH AND CORONARY ANGIOGRAPHY (N/A) as a surgical intervention.  The patient's history has been reviewed, patient examined, no change in status, stable for surgery.  I have reviewed the patient's chart and labs.  Questions were answered to the patient's satisfaction.     Nanetta Batty

## 2018-07-08 NOTE — Progress Notes (Signed)
Pt has had reaction to IV dye in the past. States she took her 3 predinose that were ordered.  Will not take benedryl due to allergy. Also states that prednisone elevates blood pressure. BP on arrival 230/67. Did not take Lisiniprl at home this morning. After talking with pt and doing preop assessment BP has come down to 172/79.  Janne Napoleon, RN notified of all of this.  Pt has now taken her own lisinipril at 0630

## 2018-07-08 NOTE — Significant Event (Signed)
Rapid Response Event Note  Overview: Cardiac - Bradycardia and Chest Pain  Initial Focused Assessment: Received call from nurse about patient's HR dropping into the 30s and patient was having chest pain with nausea/vomiting. Per nurse, patient was nauseous, stated " she was going to out", and vomited, during this episode HR dropped into the 30s. When I arrived, HR was in 80s, SR, patient had 7/10 chest pain - substernal, radiated into the LT shoulder blade region, + abdominal pain as well. Skin dry, cool to touch (room was cold), SBP in the 160s, 100% on RA, not in acute respiratory distress but was spitting up secretions. Very anxious and was crying as well. Patient just returned from Cath Lab, TR band present, 2V Left Main 2V disease.   Interventions: -- NTG 0.4 mg SL x 2 (given by nurse prior to my arrival) -- MD was already paged - ordered Fentanyl 25 mcg IV x 1 and it was administered -- EKG was done, CARDS PA was came to the bedside, it was reviewed, troponin ordered -- NTG infusion started - 10 mcg/kg/min and was increased to 50 mcg/kg/min for chest discomfort -- CARDS MD came to the bedside as well -- CXR/KUB -- GI - Protonix 40 mg EC  -- Ativan 1 mg IV x 1 -- EKG was repeated and reviewed by CARDS   Plan of Care: -- NTG drip to remain at 46mcg/kg/min -- MD updated patient and family  -- Pending CVTS consult.   Event Summary:  Call Time 1037 Arrival Time 1038 End Time 1200  Dorethia Jeanmarie R

## 2018-07-09 ENCOUNTER — Inpatient Hospital Stay (HOSPITAL_COMMUNITY): Payer: Medicare Other

## 2018-07-09 DIAGNOSIS — Z01810 Encounter for preprocedural cardiovascular examination: Secondary | ICD-10-CM

## 2018-07-09 LAB — LIPID PANEL
Cholesterol: 163 mg/dL (ref 0–200)
HDL: 57 mg/dL (ref >40)
LDL Cholesterol: 91 mg/dL (ref 0–99)
Total CHOL/HDL Ratio: 2.9 RATIO
Triglycerides: 77 mg/dL (ref <150)
VLDL: 15 mg/dL (ref 0–40)

## 2018-07-09 LAB — BASIC METABOLIC PANEL
Anion gap: 7 (ref 5–15)
BUN: 18 mg/dL (ref 8–23)
CO2: 24 mmol/L (ref 22–32)
Calcium: 9.3 mg/dL (ref 8.9–10.3)
Chloride: 106 mmol/L (ref 98–111)
Creatinine, Ser: 0.78 mg/dL (ref 0.44–1.00)
GFR calc Af Amer: >60 mL/min (ref >60)
GFR calc non Af Amer: >60 mL/min (ref >60)
Glucose, Bld: 103 mg/dL — ABNORMAL HIGH (ref 70–99)
Potassium: 3.9 mmol/L (ref 3.5–5.1)
Sodium: 137 mmol/L (ref 135–145)

## 2018-07-09 LAB — CBC
HCT: 31.2 % — ABNORMAL LOW (ref 36.0–46.0)
Hemoglobin: 10.5 g/dL — ABNORMAL LOW (ref 12.0–15.0)
MCH: 31.5 pg (ref 26.0–34.0)
MCHC: 33.7 g/dL (ref 30.0–36.0)
MCV: 93.7 fL (ref 80.0–100.0)
Platelets: 213 10*3/uL (ref 150–400)
RBC: 3.33 MIL/uL — ABNORMAL LOW (ref 3.87–5.11)
RDW: 14.5 % (ref 11.5–15.5)
WBC: 12.4 10*3/uL — ABNORMAL HIGH (ref 4.0–10.5)
nRBC: 0 % (ref 0.0–0.2)

## 2018-07-09 LAB — URINALYSIS, ROUTINE W REFLEX MICROSCOPIC
Bilirubin Urine: NEGATIVE
Glucose, UA: NEGATIVE mg/dL
Ketones, ur: NEGATIVE mg/dL
Leukocytes,Ua: NEGATIVE
Nitrite: NEGATIVE
Protein, ur: NEGATIVE mg/dL
Specific Gravity, Urine: 1.01 (ref 1.005–1.030)
pH: 6 (ref 5.0–8.0)

## 2018-07-09 LAB — BLOOD GAS, ARTERIAL
Acid-base deficit: 0.2 mmol/L (ref 0.0–2.0)
Bicarbonate: 23.1 mmol/L (ref 20.0–28.0)
Drawn by: 301361
O2 Saturation: 97.5 %
Patient temperature: 97.9
pCO2 arterial: 31.7 mmHg — ABNORMAL LOW (ref 32.0–48.0)
pH, Arterial: 7.473 — ABNORMAL HIGH (ref 7.350–7.450)
pO2, Arterial: 87.5 mmHg (ref 83.0–108.0)

## 2018-07-09 LAB — ABO/RH: ABO/RH(D): AB POS

## 2018-07-09 LAB — SURGICAL PCR SCREEN
MRSA, PCR: NEGATIVE
Staphylococcus aureus: POSITIVE — AB

## 2018-07-09 MED ORDER — POTASSIUM CHLORIDE 2 MEQ/ML IV SOLN
80.0000 meq | INTRAVENOUS | Status: DC
  Filled 2018-07-09: qty 40

## 2018-07-09 MED ORDER — EPINEPHRINE PF 1 MG/ML IJ SOLN
0.0000 ug/min | INTRAVENOUS | Status: DC
  Filled 2018-07-09: qty 4

## 2018-07-09 MED ORDER — MAGNESIUM SULFATE 50 % IJ SOLN
40.0000 meq | INTRAMUSCULAR | Status: DC
  Filled 2018-07-09: qty 9.85

## 2018-07-09 MED ORDER — DOPAMINE-DEXTROSE 3.2-5 MG/ML-% IV SOLN
0.0000 ug/kg/min | INTRAVENOUS | Status: DC
  Filled 2018-07-09: qty 250

## 2018-07-09 MED ORDER — NITROGLYCERIN IN D5W 200-5 MCG/ML-% IV SOLN
2.0000 ug/min | INTRAVENOUS | Status: DC
  Filled 2018-07-09: qty 250

## 2018-07-09 MED ORDER — TRANEXAMIC ACID 1000 MG/10ML IV SOLN
1.5000 mg/kg/h | INTRAVENOUS | Status: AC
  Administered 2018-07-10: 10:00:00 1.5 mg/kg/h via INTRAVENOUS
  Filled 2018-07-09: qty 25

## 2018-07-09 MED ORDER — SODIUM CHLORIDE 0.9 % IV SOLN
1.5000 g | INTRAVENOUS | Status: DC
  Filled 2018-07-09 (×2): qty 1.5

## 2018-07-09 MED ORDER — VANCOMYCIN HCL IN DEXTROSE 1-5 GM/200ML-% IV SOLN
1000.0000 mg | INTRAVENOUS | Status: DC
  Filled 2018-07-09: qty 200

## 2018-07-09 MED ORDER — DEXMEDETOMIDINE HCL IN NACL 400 MCG/100ML IV SOLN
0.1000 ug/kg/h | INTRAVENOUS | Status: AC
  Administered 2018-07-10: 13:00:00 .5 ug/kg/h via INTRAVENOUS
  Filled 2018-07-09: qty 100

## 2018-07-09 MED ORDER — CHLORHEXIDINE GLUCONATE CLOTH 2 % EX PADS: 6.0000 | MEDICATED_PAD | Freq: Every day | CUTANEOUS | Status: DC

## 2018-07-09 MED ORDER — CHLORHEXIDINE GLUCONATE CLOTH 2 % EX PADS
6.0000 | MEDICATED_PAD | Freq: Once | CUTANEOUS | Status: AC
  Administered 2018-07-10: 6 via TOPICAL

## 2018-07-09 MED ORDER — BISACODYL 5 MG PO TBEC: 5.0000 mg | DELAYED_RELEASE_TABLET | Freq: Once | ORAL | Status: DC

## 2018-07-09 MED ORDER — TRANEXAMIC ACID (OHS) BOLUS VIA INFUSION
15.0000 mg/kg | INTRAVENOUS | Status: AC
  Administered 2018-07-10: 09:00:00 771 mg via INTRAVENOUS
  Filled 2018-07-09: qty 771

## 2018-07-09 MED ORDER — TRANEXAMIC ACID (OHS) PUMP PRIME SOLUTION
2.0000 mg/kg | INTRAVENOUS | Status: DC
  Filled 2018-07-09: qty 1.03

## 2018-07-09 MED ORDER — SODIUM CHLORIDE 0.9 % IV SOLN
INTRAVENOUS | Status: DC
  Filled 2018-07-09: qty 30

## 2018-07-09 MED ORDER — INSULIN REGULAR(HUMAN) IN NACL 100-0.9 UT/100ML-% IV SOLN
INTRAVENOUS | Status: AC
  Administered 2018-07-10: 10:00:00 1 [IU]/h via INTRAVENOUS
  Filled 2018-07-09: qty 100

## 2018-07-09 MED ORDER — MILRINONE LACTATE IN DEXTROSE 20-5 MG/100ML-% IV SOLN
0.3000 ug/kg/min | INTRAVENOUS | Status: DC
  Filled 2018-07-09: qty 100

## 2018-07-09 MED ORDER — MUPIROCIN 2 % EX OINT
1.0000 "application " | TOPICAL_OINTMENT | Freq: Two times a day (BID) | CUTANEOUS | Status: AC
  Administered 2018-07-09 – 2018-07-13 (×9): 1 via NASAL
  Filled 2018-07-09 (×2): qty 22

## 2018-07-09 MED ORDER — PLASMA-LYTE 148 IV SOLN
INTRAVENOUS | Status: AC
  Administered 2018-07-10: 10:00:00
  Filled 2018-07-09: qty 2.5

## 2018-07-09 MED ORDER — CHLORHEXIDINE GLUCONATE CLOTH 2 % EX PADS
6.0000 | MEDICATED_PAD | Freq: Once | CUTANEOUS | Status: AC
  Administered 2018-07-09: 6 via TOPICAL

## 2018-07-09 MED ORDER — CHLORHEXIDINE GLUCONATE 0.12 % MT SOLN
15.0000 mL | Freq: Once | OROMUCOSAL | Status: AC
  Administered 2018-07-10: 15 mL via OROMUCOSAL
  Filled 2018-07-09: qty 15

## 2018-07-09 MED ORDER — PHENYLEPHRINE HCL-NACL 20-0.9 MG/250ML-% IV SOLN
30.0000 ug/min | INTRAVENOUS | Status: AC
  Administered 2018-07-10: 13:00:00 25 ug/min via INTRAVENOUS
  Filled 2018-07-09: qty 250

## 2018-07-09 MED ORDER — SODIUM CHLORIDE 0.9 % IV SOLN
750.0000 mg | INTRAVENOUS | Status: DC
  Filled 2018-07-09 (×2): qty 750

## 2018-07-09 MED FILL — Nitroglycerin IV Soln 100 MCG/ML in D5W: INTRA_ARTERIAL | Qty: 10 | Status: AC

## 2018-07-09 NOTE — Anesthesia Preprocedure Evaluation (Addendum)
Anesthesia Evaluation  Patient identified by MRN, date of birth, ID band Patient awake    Reviewed: Allergy & Precautions, NPO status , Patient's Chart, lab work & pertinent test results  Airway Mallampati: II  TM Distance: >3 FB     Dental   Pulmonary shortness of breath,    breath sounds clear to auscultation       Cardiovascular hypertension, + CAD   Rhythm:Regular Rate:Normal     Neuro/Psych  Headaches,    GI/Hepatic Neg liver ROS, hiatal hernia,   Endo/Other  Hypothyroidism   Renal/GU negative Renal ROS     Musculoskeletal  (+) Arthritis ,   Abdominal   Peds  Hematology  (+) anemia ,   Anesthesia Other Findings   Reproductive/Obstetrics                            Lab Results  Component Value Date   WBC 12.4 (H) 07/09/2018   HGB 10.5 (L) 07/09/2018   HCT 31.2 (L) 07/09/2018   MCV 93.7 07/09/2018   PLT 213 07/09/2018   Lab Results  Component Value Date   CREATININE 0.78 07/09/2018   BUN 18 07/09/2018   NA 137 07/09/2018   K 3.9 07/09/2018   CL 106 07/09/2018   CO2 24 07/09/2018    Anesthesia Physical Anesthesia Plan  ASA: IV  Anesthesia Plan: General   Post-op Pain Management:    Induction: Intravenous  PONV Risk Score and Plan: Treatment may vary due to age or medical condition  Airway Management Planned: Oral ETT  Additional Equipment: Arterial line, CVP, PA Cath, TEE and Ultrasound Guidance Line Placement  Intra-op Plan:   Post-operative Plan: Post-operative intubation/ventilation  Informed Consent: I have reviewed the patients History and Physical, chart, labs and discussed the procedure including the risks, benefits and alternatives for the proposed anesthesia with the patient or authorized representative who has indicated his/her understanding and acceptance.     Dental advisory given  Plan Discussed with:   Anesthesia Plan Comments:         Anesthesia Quick Evaluation

## 2018-07-09 NOTE — Progress Notes (Signed)
Cardiac Rehab Advisory Cardiac Rehab Phase I is not seeing pts face to face at this time due to Covid 19 restrictions. Ambulation is occurring through nursing, PT, and mobility teams. We will help facilitate that process as needed. We are calling pts in their rooms and discussing education. We will then deliver education materials to pts RN for delivery to pt.    [phone] Called pt to discuss importance of IS and walking after surgery. Pt needs IS. Discussed sternal precautions and staying in the tube. Discussed viewing pre op video. Pt stated she will consider. Pt stated that her husband is weak and undergoing chemo and her children work so she may have to go to Rehab after discharge. Pt stated she did this once before after surgery. Encouraged pt to discuss with case manager after surgery for discharge needs. Will give [facility] booklet, care guide and in the tube handout. Pt voiced understanding. Will follow up after surgery.Luetta Nutting RN BSN 07/09/2018 1:37 PM

## 2018-07-09 NOTE — Progress Notes (Signed)
I responded to a [name] to provide spiritual support for the patient. I visited the patient's room and shared words of encouragement, listened to the patient with compassion, and led in prayer. Leah Pittman is scheduled for surgery tomorrow. I shared that the Chaplain is available for additional support as needed or requested.    07/09/18 1000  Clinical Encounter Type  Visited With Patient  Visit Type Spiritual support  Referral From Nurse  Consult/Referral To Chaplain  Spiritual Encounters  Spiritual Needs Prayer;Other (Comment)  Stress Factors  Patient Stress Factors Exhausted;Major life changes    Chaplain Dr Melvyn Novas

## 2018-07-09 NOTE — Progress Notes (Addendum)
Progress Note  Patient Name: Leah Pittman Date of Encounter: 07/09/2018  Primary Cardiologist:  Thurmon FairMihai Iviona Hole, MD  Subjective   No CP overnight, anxiety about everything Has abd pain regularly, has constipation often. Does not take anything daily for it. Has HA from the nitro  Inpatient Medications    Scheduled Meds: . aspirin  81 mg Oral Daily  . levothyroxine  88 mcg Oral QAC breakfast  . lisinopril  10 mg Oral Daily  . LORazepam  1 mg Oral QHS  . magnesium oxide  400 mg Oral Daily  . pantoprazole  40 mg Oral Daily  . simvastatin  20 mg Oral Q supper  . sodium chloride flush  3 mL Intravenous Q12H   Continuous Infusions: . sodium chloride 250 mL (07/08/18 1232)  . nitroGLYCERIN 5 mcg/min (07/09/18 0923)   PRN Meds: sodium chloride, acetaminophen, ALPRAZolam, docusate sodium, nitroGLYCERIN, ondansetron (ZOFRAN) IV, ondansetron, sodium chloride flush   Vital Signs    Vitals:   07/09/18 0022 07/09/18 0558 07/09/18 0919 07/09/18 0936  BP: (!) 117/42 (!) 115/47 (!) 151/64 (!) 140/56  Pulse: 75 69 76 78  Resp: 18 18  17   Temp: 97.8 F (36.6 C) 98.4 F (36.9 C)  97.6 F (36.4 C)  TempSrc: Oral Oral  Oral  SpO2: 96% 98% 100% 100%  Weight:  51.4 kg    Height:        Intake/Output Summary (Last 24 hours) at 07/09/2018 0943 Last data filed at 07/09/2018 0902 Gross per 24 hour  Intake 1227.24 ml  Output 900 ml  Net 327.24 ml   Filed Weights   07/08/18 0545 07/09/18 0558  Weight: 52.6 kg 51.4 kg   Last Weight  Most recent update: 07/09/2018  6:02 AM   Weight  51.4 kg (113 lb 5.1 oz)           Weight change: -1.217 kg   Telemetry    SR, Junct brady during vagal episode 05/18 - Personally Reviewed  ECG    05/18 ECG is SR, HR 80, mild inf/lat ST depression, slightly different from 03/20 - Personally Reviewed  Physical Exam   General: Well developed, elderly, female appearing in no acute distress. Head: Normocephalic, atraumatic.  Neck: Supple  without bruits, JVD not elevated. Lungs:  Resp regular and unlabored, scattered rales bases. Heart: RRR, S1, S2, no S3, S4, or murmur; no rub. Abdomen: Soft, non-tender, non-distended with normoactive bowel sounds. No hepatomegaly. No rebound/guarding. No obvious abdominal masses. Extremities: No clubbing, cyanosis, no edema. Distal pedal pulses are 2+ all 4 extrem. R radial cath site w/ mild ecchymosis, no hematoma  Neuro: Alert and oriented X 3. Moves all extremities spontaneously. Psych: Anxious  Labs    Hematology Recent Labs  Lab 07/05/18 1221 07/09/18 0523  WBC 9.0 12.4*  RBC 3.85 3.33*  HGB 12.0 10.5*  HCT 35.4 31.2*  MCV 92 93.7  MCH 31.2 31.5  MCHC 33.9 33.7  RDW 13.9 14.5  PLT 251 213    Chemistry Recent Labs  Lab 07/05/18 1221 07/09/18 0523  NA 139 137  K 4.6 3.9  CL 104 106  CO2 23 24  GLUCOSE 84 103*  BUN 17 18  CREATININE 0.82 0.78  CALCIUM 9.8 9.3  GFRNONAA 65 >60  GFRAA 75 >60  ANIONGAP  --  7     Cardiac Enzymes Recent Labs  Lab 07/08/18 1122 07/08/18 1428 07/08/18 1642  TROPONINI <0.03 0.03* 0.03*   Lab Results  Component Value Date  CHOL 163 07/09/2018   HDL 57 07/09/2018   LDLCALC 91 07/09/2018   TRIG 77 07/09/2018   CHOLHDL 2.9 07/09/2018     Radiology    Dg Chest Port 1 View  Result Date: 07/08/2018 CLINICAL DATA:  Chest pain. EXAM: PORTABLE CHEST 1 VIEW COMPARISON:  Radiograph of May 10, 2018. FINDINGS: The heart size and mediastinal contours are within normal limits. Both lungs are clear. No pneumothorax or pleural effusion is noted. Atherosclerosis of thoracic aorta is noted. The visualized skeletal structures are unremarkable. IMPRESSION: No active disease. Aortic Atherosclerosis (ICD10-I70.0). Electronically Signed   By: Lupita Raider M.D.   On: 07/08/2018 12:07   Dg Abd Portable 1v  Result Date: 07/08/2018 CLINICAL DATA:  Gastric pain. EXAM: PORTABLE ABDOMEN - 1 VIEW COMPARISON:  Radiograph of July 31, 2017.  FINDINGS: No abnormal bowel dilatation is noted. Moderate stool is noted throughout the colon. No radio-opaque calculi or other significant radiographic abnormality are seen. IMPRESSION: Moderate stool burden.  No evidence of bowel obstruction or ileus. Electronically Signed   By: Lupita Raider M.D.   On: 07/08/2018 12:06     Cardiac Studies   ECHO:  07/08/2018  1. The left ventricle has normal systolic function with an ejection fraction of 60-65%. The cavity size was normal. There is mildly increased left ventricular wall thickness. Left ventricular diastolic Doppler parameters are consistent with impaired  relaxation. Indeterminate filling pressures The E/e' is 8-15. No evidence of left ventricular regional wall motion abnormalities.  2. The right ventricle has normal systolic function. The cavity was normal. There is no increase in right ventricular wall thickness.  3. The tricuspid valve is grossly normal.  4. The aortic valve is grossly normal. No stenosis of the aortic valve.  CARDIAC CATH: 07/08/2018  Prox RCA to Mid RCA lesion is 40% stenosed.  Ost LM lesion is 75% stenosed.  Mid LM lesion is 40% stenosed.  Ost Cx to Prox Cx lesion is 50% stenosed.  Prox Cx to Mid Cx lesion is 95% stenosed.  Prox LAD to Mid LAD lesion is 90% stenosed.  The left ventricular systolic function is normal.  LV end diastolic pressure is normal.  The left ventricular ejection fraction is greater than 65% by visual estimate.    Patient Profile     85 y.o. female w/ hx HTN, HLD (severe), anxiety, GERD, HH was having CP>>cath w/ 3v dz>>CABG eval.   Assessment & Plan    1. Anginal CP:  - TCTS is seeing, CABG eval ongoing. - nitro has helped her BP and possibly her CP - not on BB, on home doses Lisinopril and Zocor - would continue IV Nitro for now, rx HA - if HA resolves easily, consider change to po Imdur  2. Junctional brady - happened during vagal response to N&V, loss of bowel  control - resolved w/out meds - follow on telemetry  3. Hyperlipidemia - no profile in the system - goal LDL now < 70 - check today  4. Constipation - chronic problem, may have IBS-C - will start daily Miralax, prn Dulcolax supp  Principal Problem:   Chest pain with high risk for cardiac etiology Active Problems:   CAD in native artery    Signed, Shere Eisenhart , PA-C 9:43 AM 07/09/2018 Pager: [phone]  I have seen and examined the patient along with Theodore Demark , PA-C.  I have reviewed the chart, notes and new data.  I agree with PA's note.  Key new  complaints: no angina, mild HA from NTG Key examination changes: BP remains a little high at times, but she is also still nervous Key new findings / data: no new arrhythmia on telemetry  PLAN: For CABG in AM. Questions answered. Try to wean down the NTG.  Thurmon Fair, MD, [facility] [phone] 07/09/2018, 9:43 AM

## 2018-07-09 NOTE — Progress Notes (Signed)
Pre-CABG testing has been completed. Preliminary results can be found in CV Proc through chart review.   07/09/18 10:10 AM Olen Cordial RVT

## 2018-07-09 NOTE — Progress Notes (Signed)
Pt staph postive but mrsa negative, paged PA for CTS to inform

## 2018-07-10 ENCOUNTER — Inpatient Hospital Stay (HOSPITAL_COMMUNITY): Payer: Medicare Other | Admitting: Certified Registered Nurse Anesthetist

## 2018-07-10 ENCOUNTER — Inpatient Hospital Stay (HOSPITAL_COMMUNITY): Payer: Medicare Other

## 2018-07-10 ENCOUNTER — Inpatient Hospital Stay (HOSPITAL_COMMUNITY)
Admission: RE | Disposition: A | Discharge status: Skilled Nursing Facility | Payer: Self-pay | Source: Home / Self Care | Attending: Surgery

## 2018-07-10 DIAGNOSIS — R079 Chest pain, unspecified: Secondary | ICD-10-CM

## 2018-07-10 DIAGNOSIS — Z951 Presence of aortocoronary bypass graft: Secondary | ICD-10-CM

## 2018-07-10 DIAGNOSIS — I2510 Atherosclerotic heart disease of native coronary artery without angina pectoris: Secondary | ICD-10-CM

## 2018-07-10 HISTORY — PX: TEE WITHOUT CARDIOVERSION: SHX5443

## 2018-07-10 HISTORY — PX: CORONARY ARTERY BYPASS GRAFT: SHX141

## 2018-07-10 HISTORY — DX: Presence of aortocoronary bypass graft: Z95.1

## 2018-07-10 LAB — POCT I-STAT 7, (LYTES, BLD GAS, ICA,H+H)
Acid-base deficit: 4 mmol/L — ABNORMAL HIGH (ref 0.0–2.0)
Acid-base deficit: 1 mmol/L (ref 0.0–2.0)
Acid-base deficit: 3 mmol/L — ABNORMAL HIGH (ref 0.0–2.0)
Acid-base deficit: 2 mmol/L (ref 0.0–2.0)
Acid-base deficit: 5 mmol/L — ABNORMAL HIGH (ref 0.0–2.0)
Acid-base deficit: 2 mmol/L (ref 0.0–2.0)
Bicarbonate: 21.1 mmol/L (ref 20.0–28.0)
Bicarbonate: 23.1 mmol/L (ref 20.0–28.0)
Bicarbonate: 22.8 mmol/L (ref 20.0–28.0)
Bicarbonate: 23.1 mmol/L (ref 20.0–28.0)
Bicarbonate: 20.2 mmol/L (ref 20.0–28.0)
Bicarbonate: 22 mmol/L (ref 20.0–28.0)
Calcium, Ion: 1.29 mmol/L (ref 1.15–1.40)
Calcium, Ion: 0.91 mmol/L — ABNORMAL LOW (ref 1.15–1.40)
Calcium, Ion: 1.12 mmol/L — ABNORMAL LOW (ref 1.15–1.40)
Calcium, Ion: 1.14 mmol/L — ABNORMAL LOW (ref 1.15–1.40)
Calcium, Ion: 1.16 mmol/L (ref 1.15–1.40)
Calcium, Ion: 1.18 mmol/L (ref 1.15–1.40)
HCT: 31 % — ABNORMAL LOW (ref 36.0–46.0)
HCT: 17 % — ABNORMAL LOW (ref 36.0–46.0)
HCT: 26 % — ABNORMAL LOW (ref 36.0–46.0)
HCT: 23 % — ABNORMAL LOW (ref 36.0–46.0)
HCT: 17 % — ABNORMAL LOW (ref 36.0–46.0)
HCT: 16 % — ABNORMAL LOW (ref 36.0–46.0)
Hemoglobin: 10.5 g/dL — ABNORMAL LOW (ref 12.0–15.0)
Hemoglobin: 5.8 g/dL — CL (ref 12.0–15.0)
Hemoglobin: 8.8 g/dL — ABNORMAL LOW (ref 12.0–15.0)
Hemoglobin: 7.8 g/dL — ABNORMAL LOW (ref 12.0–15.0)
Hemoglobin: 5.8 g/dL — CL (ref 12.0–15.0)
Hemoglobin: 5.4 g/dL — CL (ref 12.0–15.0)
O2 Saturation: 100 %
O2 Saturation: 100 %
O2 Saturation: 98 %
O2 Saturation: 100 %
O2 Saturation: 99 %
O2 Saturation: 100 %
Patient temperature: 35.6
Patient temperature: 36.2
Patient temperature: 36.8
Potassium: 3.9 mmol/L (ref 3.5–5.1)
Potassium: 3.8 mmol/L (ref 3.5–5.1)
Potassium: 4.6 mmol/L (ref 3.5–5.1)
Potassium: 4.3 mmol/L (ref 3.5–5.1)
Potassium: 4.8 mmol/L (ref 3.5–5.1)
Potassium: 4.7 mmol/L (ref 3.5–5.1)
Sodium: 141 mmol/L (ref 135–145)
Sodium: 139 mmol/L (ref 135–145)
Sodium: 140 mmol/L (ref 135–145)
Sodium: 142 mmol/L (ref 135–145)
Sodium: 141 mmol/L (ref 135–145)
Sodium: 141 mmol/L (ref 135–145)
TCO2: 22 mmol/L (ref 22–32)
TCO2: 24 mmol/L (ref 22–32)
TCO2: 24 mmol/L (ref 22–32)
TCO2: 24 mmol/L (ref 22–32)
TCO2: 21 mmol/L — ABNORMAL LOW (ref 22–32)
TCO2: 23 mmol/L (ref 22–32)
pCO2 arterial: 37 mmHg (ref 32.0–48.0)
pCO2 arterial: 32.7 mmHg (ref 32.0–48.0)
pCO2 arterial: 42.6 mmHg (ref 32.0–48.0)
pCO2 arterial: 38.7 mmHg (ref 32.0–48.0)
pCO2 arterial: 34 mmHg (ref 32.0–48.0)
pCO2 arterial: 34.1 mmHg (ref 32.0–48.0)
pH, Arterial: 7.364 (ref 7.350–7.450)
pH, Arterial: 7.457 — ABNORMAL HIGH (ref 7.350–7.450)
pH, Arterial: 7.336 — ABNORMAL LOW (ref 7.350–7.450)
pH, Arterial: 7.378 (ref 7.350–7.450)
pH, Arterial: 7.379 (ref 7.350–7.450)
pH, Arterial: 7.416 (ref 7.350–7.450)
pO2, Arterial: 329 mmHg — ABNORMAL HIGH (ref 83.0–108.0)
pO2, Arterial: 361 mmHg — ABNORMAL HIGH (ref 83.0–108.0)
pO2, Arterial: 117 mmHg — ABNORMAL HIGH (ref 83.0–108.0)
pO2, Arterial: 222 mmHg — ABNORMAL HIGH (ref 83.0–108.0)
pO2, Arterial: 166 mmHg — ABNORMAL HIGH (ref 83.0–108.0)
pO2, Arterial: 203 mmHg — ABNORMAL HIGH (ref 83.0–108.0)

## 2018-07-10 LAB — CBC
HCT: 32.2 % — ABNORMAL LOW (ref 36.0–46.0)
HCT: 26.7 % — ABNORMAL LOW (ref 36.0–46.0)
Hemoglobin: 11 g/dL — ABNORMAL LOW (ref 12.0–15.0)
Hemoglobin: 8.9 g/dL — ABNORMAL LOW (ref 12.0–15.0)
MCH: 31.9 pg (ref 26.0–34.0)
MCH: 30.5 pg (ref 26.0–34.0)
MCHC: 34.2 g/dL (ref 30.0–36.0)
MCHC: 33.3 g/dL (ref 30.0–36.0)
MCV: 93.3 fL (ref 80.0–100.0)
MCV: 91.4 fL (ref 80.0–100.0)
Platelets: 224 10*3/uL (ref 150–400)
Platelets: 100 10*3/uL — ABNORMAL LOW (ref 150–400)
RBC: 3.45 MIL/uL — ABNORMAL LOW (ref 3.87–5.11)
RBC: 2.92 MIL/uL — ABNORMAL LOW (ref 3.87–5.11)
RDW: 14.6 % (ref 11.5–15.5)
RDW: 16.4 % — ABNORMAL HIGH (ref 11.5–15.5)
WBC: 9 10*3/uL (ref 4.0–10.5)
WBC: 13.4 10*3/uL — ABNORMAL HIGH (ref 4.0–10.5)
nRBC: 0 % (ref 0.0–0.2)
nRBC: 0 % (ref 0.0–0.2)

## 2018-07-10 LAB — GLUCOSE, CAPILLARY
Glucose-Capillary: 95 mg/dL (ref 70–99)
Glucose-Capillary: 111 mg/dL — ABNORMAL HIGH (ref 70–99)
Glucose-Capillary: 103 mg/dL — ABNORMAL HIGH (ref 70–99)
Glucose-Capillary: 98 mg/dL (ref 70–99)

## 2018-07-10 LAB — POCT I-STAT 4, (NA,K, GLUC, HGB,HCT)
Glucose, Bld: 111 mg/dL — ABNORMAL HIGH (ref 70–99)
Glucose, Bld: 135 mg/dL — ABNORMAL HIGH (ref 70–99)
Glucose, Bld: 113 mg/dL — ABNORMAL HIGH (ref 70–99)
Glucose, Bld: 130 mg/dL — ABNORMAL HIGH (ref 70–99)
Glucose, Bld: 155 mg/dL — ABNORMAL HIGH (ref 70–99)
Glucose, Bld: 163 mg/dL — ABNORMAL HIGH (ref 70–99)
Glucose, Bld: 155 mg/dL — ABNORMAL HIGH (ref 70–99)
Glucose, Bld: 116 mg/dL — ABNORMAL HIGH (ref 70–99)
HCT: 30 % — ABNORMAL LOW (ref 36.0–46.0)
HCT: 26 % — ABNORMAL LOW (ref 36.0–46.0)
HCT: 16 % — ABNORMAL LOW (ref 36.0–46.0)
HCT: 18 % — ABNORMAL LOW (ref 36.0–46.0)
HCT: 19 % — ABNORMAL LOW (ref 36.0–46.0)
HCT: 24 % — ABNORMAL LOW (ref 36.0–46.0)
HCT: 24 % — ABNORMAL LOW (ref 36.0–46.0)
HCT: 26 % — ABNORMAL LOW (ref 36.0–46.0)
Hemoglobin: 10.2 g/dL — ABNORMAL LOW (ref 12.0–15.0)
Hemoglobin: 8.8 g/dL — ABNORMAL LOW (ref 12.0–15.0)
Hemoglobin: 5.4 g/dL — CL (ref 12.0–15.0)
Hemoglobin: 6.1 g/dL — CL (ref 12.0–15.0)
Hemoglobin: 6.5 g/dL — CL (ref 12.0–15.0)
Hemoglobin: 8.2 g/dL — ABNORMAL LOW (ref 12.0–15.0)
Hemoglobin: 8.2 g/dL — ABNORMAL LOW (ref 12.0–15.0)
Hemoglobin: 8.8 g/dL — ABNORMAL LOW (ref 12.0–15.0)
Potassium: 3.9 mmol/L (ref 3.5–5.1)
Potassium: 3.8 mmol/L (ref 3.5–5.1)
Potassium: 3.8 mmol/L (ref 3.5–5.1)
Potassium: 4.2 mmol/L (ref 3.5–5.1)
Potassium: 5.1 mmol/L (ref 3.5–5.1)
Potassium: 5.4 mmol/L — ABNORMAL HIGH (ref 3.5–5.1)
Potassium: 4.6 mmol/L (ref 3.5–5.1)
Potassium: 3.9 mmol/L (ref 3.5–5.1)
Sodium: 142 mmol/L (ref 135–145)
Sodium: 139 mmol/L (ref 135–145)
Sodium: 135 mmol/L (ref 135–145)
Sodium: 138 mmol/L (ref 135–145)
Sodium: 139 mmol/L (ref 135–145)
Sodium: 137 mmol/L (ref 135–145)
Sodium: 139 mmol/L (ref 135–145)
Sodium: 142 mmol/L (ref 135–145)

## 2018-07-10 LAB — BASIC METABOLIC PANEL
Anion gap: 9 (ref 5–15)
BUN: 19 mg/dL (ref 8–23)
CO2: 23 mmol/L (ref 22–32)
Calcium: 9.3 mg/dL (ref 8.9–10.3)
Chloride: 106 mmol/L (ref 98–111)
Creatinine, Ser: 0.78 mg/dL (ref 0.44–1.00)
GFR calc Af Amer: >60 mL/min (ref >60)
GFR calc non Af Amer: >60 mL/min (ref >60)
Glucose, Bld: 108 mg/dL — ABNORMAL HIGH (ref 70–99)
Potassium: 3.9 mmol/L (ref 3.5–5.1)
Sodium: 138 mmol/L (ref 135–145)

## 2018-07-10 LAB — CREATININE, SERUM
Creatinine, Ser: 0.58 mg/dL (ref 0.44–1.00)
GFR calc Af Amer: >60 mL/min (ref >60)
GFR calc non Af Amer: >60 mL/min (ref >60)

## 2018-07-10 LAB — HEMOGLOBIN AND HEMATOCRIT, BLOOD
HCT: 26.3 % — ABNORMAL LOW (ref 36.0–46.0)
Hemoglobin: 8.5 g/dL — ABNORMAL LOW (ref 12.0–15.0)

## 2018-07-10 LAB — POCT I-STAT, CHEM 8
BUN: 11 mg/dL (ref 8–23)
Calcium, Ion: 1.18 mmol/L (ref 1.15–1.40)
Chloride: 109 mmol/L (ref 98–111)
Creatinine, Ser: 0.4 mg/dL — ABNORMAL LOW (ref 0.44–1.00)
Glucose, Bld: 150 mg/dL — ABNORMAL HIGH (ref 70–99)
HCT: 19 % — ABNORMAL LOW (ref 36.0–46.0)
Hemoglobin: 6.5 g/dL — CL (ref 12.0–15.0)
Potassium: 4.7 mmol/L (ref 3.5–5.1)
Sodium: 140 mmol/L (ref 135–145)
TCO2: 21 mmol/L — ABNORMAL LOW (ref 22–32)

## 2018-07-10 LAB — HEMOGLOBIN A1C
Hgb A1c MFr Bld: 5.8 % — ABNORMAL HIGH (ref 4.8–5.6)
Mean Plasma Glucose: 119.76 mg/dL

## 2018-07-10 LAB — APTT
aPTT: 26 seconds (ref 24–36)
aPTT: 32 seconds (ref 24–36)

## 2018-07-10 LAB — PREPARE RBC (CROSSMATCH)

## 2018-07-10 LAB — MAGNESIUM: Magnesium: 3.7 mg/dL — ABNORMAL HIGH (ref 1.7–2.4)

## 2018-07-10 LAB — PROTIME-INR
INR: 1.1 (ref 0.8–1.2)
INR: 1.7 — ABNORMAL HIGH (ref 0.8–1.2)
Prothrombin Time: 13.7 seconds (ref 11.4–15.2)
Prothrombin Time: 19.5 seconds — ABNORMAL HIGH (ref 11.4–15.2)

## 2018-07-10 LAB — PLATELET COUNT: Platelets: 126 10*3/uL — ABNORMAL LOW (ref 150–400)

## 2018-07-10 SURGERY — CORONARY ARTERY BYPASS GRAFTING (CABG)
Anesthesia: General | Site: Chest

## 2018-07-10 MED ORDER — DOCUSATE SODIUM 100 MG PO CAPS
200.0000 mg | ORAL_CAPSULE | Freq: Every day | ORAL | Status: DC
  Administered 2018-07-11 – 2018-07-19 (×7): 200 mg via ORAL
  Filled 2018-07-10 (×7): qty 2

## 2018-07-10 MED ORDER — DEXMEDETOMIDINE HCL IN NACL 200 MCG/50ML IV SOLN
0.0000 ug/kg/h | INTRAVENOUS | Status: DC
  Filled 2018-07-10: qty 50

## 2018-07-10 MED ORDER — PHENYLEPHRINE 40 MCG/ML (10ML) SYRINGE FOR IV PUSH (FOR BLOOD PRESSURE SUPPORT)
PREFILLED_SYRINGE | INTRAVENOUS | Status: AC
  Filled 2018-07-10: qty 10

## 2018-07-10 MED ORDER — LACTATED RINGERS IV SOLN
INTRAVENOUS | Status: DC | PRN
  Administered 2018-07-10: 08:00:00 via INTRAVENOUS

## 2018-07-10 MED ORDER — LACTATED RINGERS IV SOLN
INTRAVENOUS | Status: DC
  Administered 2018-07-13: 01:00:00 via INTRAVENOUS

## 2018-07-10 MED ORDER — OXYCODONE HCL 5 MG PO TABS
5.0000 mg | ORAL_TABLET | ORAL | Status: DC | PRN
  Administered 2018-07-11 (×2): 5 mg via ORAL
  Filled 2018-07-10 (×2): qty 1

## 2018-07-10 MED ORDER — SODIUM CHLORIDE 0.9 % IV SOLN
INTRAVENOUS | Status: DC | PRN
  Administered 2018-07-10: 13:00:00 750 mg via INTRAVENOUS

## 2018-07-10 MED ORDER — FENTANYL CITRATE (PF) 250 MCG/5ML IJ SOLN
INTRAMUSCULAR | Status: DC | PRN
  Administered 2018-07-10: 50 ug via INTRAVENOUS
  Administered 2018-07-10: 100 ug via INTRAVENOUS
  Administered 2018-07-10: 50 ug via INTRAVENOUS
  Administered 2018-07-10: 100 ug via INTRAVENOUS
  Administered 2018-07-10: 50 ug via INTRAVENOUS
  Administered 2018-07-10: 150 ug via INTRAVENOUS
  Administered 2018-07-10: 50 ug via INTRAVENOUS
  Administered 2018-07-10: 300 ug via INTRAVENOUS
  Administered 2018-07-10: 100 ug via INTRAVENOUS
  Administered 2018-07-10 (×2): 50 ug via INTRAVENOUS

## 2018-07-10 MED ORDER — BISACODYL 10 MG RE SUPP: 10.0000 mg | Freq: Every day | RECTAL | Status: DC

## 2018-07-10 MED ORDER — VANCOMYCIN HCL 1000 MG IV SOLR
INTRAVENOUS | Status: DC | PRN
  Administered 2018-07-10: 08:00:00 1000 mg via INTRAVENOUS

## 2018-07-10 MED ORDER — SODIUM CHLORIDE 0.45 % IV SOLN: INTRAVENOUS | Status: DC | PRN

## 2018-07-10 MED ORDER — HEPARIN SODIUM (PORCINE) 1000 UNIT/ML IJ SOLN
INTRAMUSCULAR | Status: DC | PRN
  Administered 2018-07-10: 18000 [IU] via INTRAVENOUS

## 2018-07-10 MED ORDER — ALBUMIN HUMAN 5 % IV SOLN
250.0000 mL | INTRAVENOUS | Status: AC | PRN
  Administered 2018-07-10 (×4): 12.5 g via INTRAVENOUS
  Filled 2018-07-10: qty 500

## 2018-07-10 MED ORDER — ACETAMINOPHEN 160 MG/5ML PO SOLN: 650.0000 mg | Freq: Once | ORAL | Status: AC

## 2018-07-10 MED ORDER — PROTAMINE SULFATE 10 MG/ML IV SOLN
INTRAVENOUS | Status: AC
  Filled 2018-07-10: qty 25

## 2018-07-10 MED ORDER — LIDOCAINE 2% (20 MG/ML) 5 ML SYRINGE
INTRAMUSCULAR | Status: DC | PRN
  Administered 2018-07-10: 60 mg via INTRAVENOUS

## 2018-07-10 MED ORDER — SODIUM CHLORIDE 0.9 % IV SOLN
1.5000 g | Freq: Two times a day (BID) | INTRAVENOUS | Status: AC
  Administered 2018-07-10 – 2018-07-12 (×4): 1.5 g via INTRAVENOUS
  Filled 2018-07-10 (×4): qty 1.5

## 2018-07-10 MED ORDER — SUCCINYLCHOLINE CHLORIDE 20 MG/ML IJ SOLN
INTRAMUSCULAR | Status: DC | PRN
  Administered 2018-07-10: 60 mg via INTRAVENOUS

## 2018-07-10 MED ORDER — SODIUM CHLORIDE 0.9% FLUSH: 3.0000 mL | INTRAVENOUS | Status: DC | PRN

## 2018-07-10 MED ORDER — SODIUM CHLORIDE 0.9 % IV SOLN
250.0000 mL | INTRAVENOUS | Status: DC
  Administered 2018-07-11: 250 mL via INTRAVENOUS

## 2018-07-10 MED ORDER — CHLORHEXIDINE GLUCONATE 0.12 % MT SOLN
15.0000 mL | OROMUCOSAL | Status: AC
  Administered 2018-07-10: 15 mL via OROMUCOSAL

## 2018-07-10 MED ORDER — METOPROLOL TARTRATE 12.5 MG HALF TABLET
12.5000 mg | ORAL_TABLET | Freq: Two times a day (BID) | ORAL | Status: DC
  Administered 2018-07-11 – 2018-07-19 (×15): 12.5 mg via ORAL
  Filled 2018-07-10 (×17): qty 1

## 2018-07-10 MED ORDER — SODIUM CHLORIDE 0.9% IV SOLUTION
Freq: Once | INTRAVENOUS | Status: AC
  Administered 2018-07-10: 22:00:00 via INTRAVENOUS

## 2018-07-10 MED ORDER — ROCURONIUM BROMIDE 10 MG/ML (PF) SYRINGE
PREFILLED_SYRINGE | INTRAVENOUS | Status: AC
  Filled 2018-07-10: qty 10

## 2018-07-10 MED ORDER — METOPROLOL TARTRATE 25 MG/10 ML ORAL SUSPENSION
12.5000 mg | Freq: Two times a day (BID) | ORAL | Status: DC
  Filled 2018-07-10: qty 5

## 2018-07-10 MED ORDER — 0.9 % SODIUM CHLORIDE (POUR BTL) OPTIME
TOPICAL | Status: DC | PRN
  Administered 2018-07-10: 5000 mL

## 2018-07-10 MED ORDER — BISACODYL 5 MG PO TBEC
10.0000 mg | DELAYED_RELEASE_TABLET | Freq: Every day | ORAL | Status: DC
  Administered 2018-07-11 – 2018-07-15 (×3): 10 mg via ORAL
  Filled 2018-07-10 (×4): qty 2

## 2018-07-10 MED ORDER — LACTATED RINGERS IV SOLN
INTRAVENOUS | Status: DC | PRN
  Administered 2018-07-10: 09:00:00 via INTRAVENOUS

## 2018-07-10 MED ORDER — SUCCINYLCHOLINE CHLORIDE 200 MG/10ML IV SOSY
PREFILLED_SYRINGE | INTRAVENOUS | Status: AC
  Filled 2018-07-10: qty 10

## 2018-07-10 MED ORDER — HEPARIN SODIUM (PORCINE) 1000 UNIT/ML IJ SOLN
INTRAMUSCULAR | Status: AC
  Filled 2018-07-10: qty 2

## 2018-07-10 MED ORDER — INSULIN REGULAR(HUMAN) IN NACL 100-0.9 UT/100ML-% IV SOLN: INTRAVENOUS | Status: DC

## 2018-07-10 MED ORDER — PHENYLEPHRINE 40 MCG/ML (10ML) SYRINGE FOR IV PUSH (FOR BLOOD PRESSURE SUPPORT)
PREFILLED_SYRINGE | INTRAVENOUS | Status: DC | PRN
  Administered 2018-07-10: 80 ug via INTRAVENOUS
  Administered 2018-07-10: 40 ug via INTRAVENOUS
  Administered 2018-07-10: 80 ug via INTRAVENOUS
  Administered 2018-07-10: 40 ug via INTRAVENOUS
  Administered 2018-07-10: 20 ug via INTRAVENOUS

## 2018-07-10 MED ORDER — MIDAZOLAM HCL (PF) 10 MG/2ML IJ SOLN
INTRAMUSCULAR | Status: AC
  Filled 2018-07-10: qty 2

## 2018-07-10 MED ORDER — SODIUM CHLORIDE 0.9 % IV SOLN
INTRAVENOUS | Status: DC | PRN
  Administered 2018-07-10: 09:00:00 1.5 g via INTRAVENOUS

## 2018-07-10 MED ORDER — FENTANYL CITRATE (PF) 250 MCG/5ML IJ SOLN
INTRAMUSCULAR | Status: AC
  Filled 2018-07-10: qty 10

## 2018-07-10 MED ORDER — PROPOFOL 10 MG/ML IV BOLUS
INTRAVENOUS | Status: AC
  Filled 2018-07-10: qty 20

## 2018-07-10 MED ORDER — THROMBIN 20000 UNITS EX SOLR
CUTANEOUS | Status: DC | PRN
  Administered 2018-07-10: 20000 [IU] via TOPICAL

## 2018-07-10 MED ORDER — ROCURONIUM BROMIDE 10 MG/ML (PF) SYRINGE
PREFILLED_SYRINGE | INTRAVENOUS | Status: DC | PRN
  Administered 2018-07-10: 30 mg via INTRAVENOUS
  Administered 2018-07-10: 60 mg via INTRAVENOUS
  Administered 2018-07-10: 40 mg via INTRAVENOUS

## 2018-07-10 MED ORDER — ACETAMINOPHEN 160 MG/5ML PO SOLN
1000.0000 mg | Freq: Four times a day (QID) | ORAL | Status: DC
  Administered 2018-07-11: 1000 mg
  Filled 2018-07-10: qty 40.6

## 2018-07-10 MED ORDER — ALBUMIN HUMAN 5 % IV SOLN
INTRAVENOUS | Status: DC | PRN
  Administered 2018-07-10 (×2): via INTRAVENOUS

## 2018-07-10 MED ORDER — MAGNESIUM SULFATE 4 GM/100ML IV SOLN
4.0000 g | Freq: Once | INTRAVENOUS | Status: AC
  Administered 2018-07-10: 4 g via INTRAVENOUS
  Filled 2018-07-10: qty 100

## 2018-07-10 MED ORDER — PANTOPRAZOLE SODIUM 40 MG PO TBEC
40.0000 mg | DELAYED_RELEASE_TABLET | Freq: Every day | ORAL | Status: DC
  Administered 2018-07-12 – 2018-07-19 (×8): 40 mg via ORAL
  Filled 2018-07-10 (×8): qty 1

## 2018-07-10 MED ORDER — THROMBIN (RECOMBINANT) 20000 UNITS EX SOLR
CUTANEOUS | Status: AC
  Filled 2018-07-10: qty 20000

## 2018-07-10 MED ORDER — NITROGLYCERIN IN D5W 200-5 MCG/ML-% IV SOLN: 0.0000 ug/min | INTRAVENOUS | Status: DC

## 2018-07-10 MED ORDER — MORPHINE SULFATE (PF) 2 MG/ML IV SOLN
1.0000 mg | INTRAVENOUS | Status: DC | PRN
  Administered 2018-07-10: 2 mg via INTRAVENOUS
  Administered 2018-07-11: 4 mg via INTRAVENOUS
  Administered 2018-07-11: 2 mg via INTRAVENOUS
  Filled 2018-07-10: qty 1
  Filled 2018-07-10: qty 2
  Filled 2018-07-10 (×2): qty 1

## 2018-07-10 MED ORDER — ACETAMINOPHEN 650 MG RE SUPP
650.0000 mg | Freq: Once | RECTAL | Status: AC
  Administered 2018-07-10: 650 mg via RECTAL

## 2018-07-10 MED ORDER — ONDANSETRON HCL 4 MG/2ML IJ SOLN
4.0000 mg | Freq: Four times a day (QID) | INTRAMUSCULAR | Status: DC | PRN
  Administered 2018-07-10 – 2018-07-15 (×6): 4 mg via INTRAVENOUS
  Filled 2018-07-10 (×6): qty 2

## 2018-07-10 MED ORDER — MIDAZOLAM HCL 2 MG/2ML IJ SOLN: 2.0000 mg | INTRAMUSCULAR | Status: DC | PRN

## 2018-07-10 MED ORDER — LIDOCAINE 2% (20 MG/ML) 5 ML SYRINGE
INTRAMUSCULAR | Status: AC
  Filled 2018-07-10: qty 5

## 2018-07-10 MED ORDER — ASPIRIN 81 MG PO CHEW
324.0000 mg | CHEWABLE_TABLET | Freq: Every day | ORAL | Status: DC
  Administered 2018-07-11: 324 mg
  Filled 2018-07-10: qty 4

## 2018-07-10 MED ORDER — THROMBIN 20000 UNITS EX SOLR
OROMUCOSAL | Status: DC | PRN
  Administered 2018-07-10 (×3): 4 mL via TOPICAL

## 2018-07-10 MED ORDER — METOPROLOL TARTRATE 5 MG/5ML IV SOLN
2.5000 mg | INTRAVENOUS | Status: DC | PRN
  Administered 2018-07-12: 5 mg via INTRAVENOUS
  Filled 2018-07-10: qty 5

## 2018-07-10 MED ORDER — LACTATED RINGERS IV SOLN
500.0000 mL | Freq: Once | INTRAVENOUS | Status: AC | PRN
  Administered 2018-07-10: 21:00:00 via INTRAVENOUS

## 2018-07-10 MED ORDER — VANCOMYCIN HCL IN DEXTROSE 1-5 GM/200ML-% IV SOLN: 1000.0000 mg | Freq: Once | INTRAVENOUS | Status: DC

## 2018-07-10 MED ORDER — INSULIN REGULAR BOLUS VIA INFUSION
0.0000 [IU] | Freq: Three times a day (TID) | INTRAVENOUS | Status: DC
  Filled 2018-07-10: qty 10

## 2018-07-10 MED ORDER — FENTANYL CITRATE (PF) 250 MCG/5ML IJ SOLN
INTRAMUSCULAR | Status: AC
  Filled 2018-07-10: qty 5

## 2018-07-10 MED ORDER — SODIUM CHLORIDE 0.9 % IV SOLN
INTRAVENOUS | Status: DC
  Administered 2018-07-10: 15:00:00 via INTRAVENOUS

## 2018-07-10 MED ORDER — FAMOTIDINE IN NACL 20-0.9 MG/50ML-% IV SOLN
20.0000 mg | INTRAVENOUS | Status: AC
  Administered 2018-07-10 (×2): 20 mg via INTRAVENOUS
  Filled 2018-07-10: qty 50

## 2018-07-10 MED ORDER — LACTATED RINGERS IV SOLN
INTRAVENOUS | Status: DC | PRN
  Administered 2018-07-10 (×2): via INTRAVENOUS

## 2018-07-10 MED ORDER — POTASSIUM CHLORIDE 10 MEQ/50ML IV SOLN
10.0000 meq | INTRAVENOUS | Status: AC
  Administered 2018-07-10 (×3): 10 meq via INTRAVENOUS

## 2018-07-10 MED ORDER — LACTATED RINGERS IV SOLN: INTRAVENOUS | Status: DC

## 2018-07-10 MED ORDER — PHENYLEPHRINE HCL-NACL 20-0.9 MG/250ML-% IV SOLN: 0.0000 ug/min | INTRAVENOUS | Status: DC

## 2018-07-10 MED ORDER — ACETAMINOPHEN 500 MG PO TABS
1000.0000 mg | ORAL_TABLET | Freq: Four times a day (QID) | ORAL | Status: AC
  Administered 2018-07-11 – 2018-07-15 (×16): 1000 mg via ORAL
  Filled 2018-07-10 (×17): qty 2

## 2018-07-10 MED ORDER — PROPOFOL 10 MG/ML IV BOLUS
INTRAVENOUS | Status: DC | PRN
  Administered 2018-07-10 (×2): 20 mg via INTRAVENOUS
  Administered 2018-07-10: 10 mg via INTRAVENOUS
  Administered 2018-07-10: 20 mg via INTRAVENOUS

## 2018-07-10 MED ORDER — HEMOSTATIC AGENTS (NO CHARGE) OPTIME
TOPICAL | Status: DC | PRN
  Administered 2018-07-10: 1 via TOPICAL

## 2018-07-10 MED ORDER — HEPARIN SODIUM (PORCINE) 1000 UNIT/ML IJ SOLN
INTRAMUSCULAR | Status: AC
  Filled 2018-07-10: qty 1

## 2018-07-10 MED ORDER — TRAMADOL HCL 50 MG PO TABS: 50.0000 mg | ORAL_TABLET | ORAL | Status: DC | PRN

## 2018-07-10 MED ORDER — MIDAZOLAM HCL 5 MG/5ML IJ SOLN
INTRAMUSCULAR | Status: DC | PRN
  Administered 2018-07-10 (×2): 1 mg via INTRAVENOUS

## 2018-07-10 MED ORDER — DEXAMETHASONE SODIUM PHOSPHATE 10 MG/ML IJ SOLN
INTRAMUSCULAR | Status: AC
  Filled 2018-07-10: qty 1

## 2018-07-10 MED ORDER — PROTAMINE SULFATE 10 MG/ML IV SOLN
INTRAVENOUS | Status: DC | PRN
  Administered 2018-07-10: 170 mg via INTRAVENOUS
  Administered 2018-07-10: 10 mg via INTRAVENOUS

## 2018-07-10 MED ORDER — SODIUM CHLORIDE 0.9% FLUSH
3.0000 mL | Freq: Two times a day (BID) | INTRAVENOUS | Status: DC
  Administered 2018-07-11 – 2018-07-12 (×3): 3 mL via INTRAVENOUS
  Administered 2018-07-12: 6 mL via INTRAVENOUS
  Administered 2018-07-13 – 2018-07-18 (×11): 3 mL via INTRAVENOUS

## 2018-07-10 MED ORDER — ASPIRIN EC 325 MG PO TBEC: 325.0000 mg | DELAYED_RELEASE_TABLET | Freq: Every day | ORAL | Status: DC

## 2018-07-10 SURGICAL SUPPLY — 106 items
BAG DECANTER FOR FLEXI CONT (MISCELLANEOUS) ×4 IMPLANT
BANDAGE ACE 4X5 VEL STRL LF (GAUZE/BANDAGES/DRESSINGS) ×4 IMPLANT
BANDAGE ACE 6X5 VEL STRL LF (GAUZE/BANDAGES/DRESSINGS) ×4 IMPLANT
BANDAGE ELASTIC 4 VELCRO ST LF (GAUZE/BANDAGES/DRESSINGS) ×2 IMPLANT
BASKET HEART  (ORDER IN 25'S) (MISCELLANEOUS) ×1
BASKET HEART (ORDER IN 25'S) (MISCELLANEOUS) ×1
BASKET HEART (ORDER IN 25S) (MISCELLANEOUS) ×2 IMPLANT
BLADE STERNUM SYSTEM 6 (BLADE) ×4 IMPLANT
BNDG GAUZE ELAST 4 BULKY (GAUZE/BANDAGES/DRESSINGS) ×4 IMPLANT
CANISTER SUCT 3000ML PPV (MISCELLANEOUS) ×4 IMPLANT
CATH ROBINSON RED A/P 18FR (CATHETERS) ×8 IMPLANT
CATH THORACIC 28FR (CATHETERS) ×6 IMPLANT
CATH THORACIC 36FR (CATHETERS) ×4 IMPLANT
CATH THORACIC 36FR RT ANG (CATHETERS) ×4 IMPLANT
CLIP VESOCCLUDE MED 24/CT (CLIP) IMPLANT
CLIP VESOCCLUDE SM WIDE 24/CT (CLIP) IMPLANT
CLIP VESOCCLUDE SM WIDE 6/CT (CLIP) ×4 IMPLANT
CONN Y 3/8X3/8X3/8  BEN (MISCELLANEOUS) ×2
CONN Y 3/8X3/8X3/8 BEN (MISCELLANEOUS) IMPLANT
COVER WAND RF STERILE (DRAPES) ×2 IMPLANT
CRADLE DONUT ADULT HEAD (MISCELLANEOUS) ×4 IMPLANT
DRAPE CARDIOVASCULAR INCISE (DRAPES) ×4
DRAPE SLUSH/WARMER DISC (DRAPES) ×4 IMPLANT
DRAPE SRG 135X102X78XABS (DRAPES) ×2 IMPLANT
DRSG AQUACEL AG ADV 3.5X10 (GAUZE/BANDAGES/DRESSINGS) ×2 IMPLANT
DRSG COVADERM 4X14 (GAUZE/BANDAGES/DRESSINGS) ×4 IMPLANT
ELECT CAUTERY BLADE 6.4 (BLADE) ×4 IMPLANT
ELECT REM PT RETURN 9FT ADLT (ELECTROSURGICAL) ×8
ELECTRODE REM PT RTRN 9FT ADLT (ELECTROSURGICAL) ×4 IMPLANT
FELT TEFLON 1X6 (MISCELLANEOUS) ×6 IMPLANT
GAUZE SPONGE 4X4 12PLY STRL (GAUZE/BANDAGES/DRESSINGS) ×8 IMPLANT
GLOVE BIO SURGEON STRL SZ 6 (GLOVE) IMPLANT
GLOVE BIO SURGEON STRL SZ 6.5 (GLOVE) IMPLANT
GLOVE BIO SURGEON STRL SZ7 (GLOVE) IMPLANT
GLOVE BIO SURGEON STRL SZ7.5 (GLOVE) IMPLANT
GLOVE BIO SURGEONS STRL SZ 6.5 (GLOVE)
GLOVE BIOGEL PI IND STRL 6 (GLOVE) IMPLANT
GLOVE BIOGEL PI IND STRL 6.5 (GLOVE) IMPLANT
GLOVE BIOGEL PI IND STRL 7.0 (GLOVE) IMPLANT
GLOVE BIOGEL PI INDICATOR 6 (GLOVE) ×4
GLOVE BIOGEL PI INDICATOR 6.5 (GLOVE)
GLOVE BIOGEL PI INDICATOR 7.0 (GLOVE) ×8
GLOVE EUDERMIC 7 POWDERFREE (GLOVE) ×8 IMPLANT
GLOVE ORTHO TXT STRL SZ7.5 (GLOVE) IMPLANT
GOWN STRL REUS W/ TWL LRG LVL3 (GOWN DISPOSABLE) ×8 IMPLANT
GOWN STRL REUS W/ TWL XL LVL3 (GOWN DISPOSABLE) ×2 IMPLANT
GOWN STRL REUS W/TWL LRG LVL3 (GOWN DISPOSABLE) ×16
GOWN STRL REUS W/TWL XL LVL3 (GOWN DISPOSABLE) ×4
HEMOSTAT POWDER SURGIFOAM 1G (HEMOSTASIS) ×12 IMPLANT
HEMOSTAT SURGICEL 2X14 (HEMOSTASIS) ×4 IMPLANT
INSERT FOGARTY 61MM (MISCELLANEOUS) IMPLANT
INSERT FOGARTY XLG (MISCELLANEOUS) IMPLANT
KIT BASIN OR (CUSTOM PROCEDURE TRAY) ×4 IMPLANT
KIT CATH CPB BARTLE (MISCELLANEOUS) ×4 IMPLANT
KIT SUCTION CATH 14FR (SUCTIONS) ×4 IMPLANT
KIT TURNOVER KIT B (KITS) ×4 IMPLANT
KIT VASOVIEW HEMOPRO 2 VH 4000 (KITS) ×4 IMPLANT
NS IRRIG 1000ML POUR BTL (IV SOLUTION) ×20 IMPLANT
PACK E OPEN HEART (SUTURE) ×4 IMPLANT
PACK OPEN HEART (CUSTOM PROCEDURE TRAY) ×4 IMPLANT
PAD ARMBOARD 7.5X6 YLW CONV (MISCELLANEOUS) ×8 IMPLANT
PAD ELECT DEFIB RADIOL ZOLL (MISCELLANEOUS) ×4 IMPLANT
PENCIL BUTTON HOLSTER BLD 10FT (ELECTRODE) ×4 IMPLANT
PUNCH AORTIC ROTATE 4.0MM (MISCELLANEOUS) IMPLANT
PUNCH AORTIC ROTATE 4.5MM 8IN (MISCELLANEOUS) ×4 IMPLANT
PUNCH AORTIC ROTATE 5MM 8IN (MISCELLANEOUS) IMPLANT
SET CARDIOPLEGIA MPS 5001102 (MISCELLANEOUS) ×2 IMPLANT
SPONGE INTESTINAL PEANUT (DISPOSABLE) IMPLANT
SPONGE LAP 18X18 RF (DISPOSABLE) ×2 IMPLANT
SPONGE LAP 4X18 RFD (DISPOSABLE) ×4 IMPLANT
SUT BONE WAX W31G (SUTURE) ×4 IMPLANT
SUT MNCRL AB 4-0 PS2 18 (SUTURE) IMPLANT
SUT PROLENE 3 0 SH DA (SUTURE) IMPLANT
SUT PROLENE 3 0 SH1 36 (SUTURE) ×4 IMPLANT
SUT PROLENE 4 0 RB 1 (SUTURE)
SUT PROLENE 4 0 SH DA (SUTURE) IMPLANT
SUT PROLENE 4-0 RB1 .5 CRCL 36 (SUTURE) IMPLANT
SUT PROLENE 5 0 C 1 36 (SUTURE) IMPLANT
SUT PROLENE 6 0 C 1 30 (SUTURE) ×2 IMPLANT
SUT PROLENE 7 0 BV 1 (SUTURE) ×2 IMPLANT
SUT PROLENE 7 0 BV1 MDA (SUTURE) ×4 IMPLANT
SUT PROLENE 8 0 BV175 6 (SUTURE) IMPLANT
SUT SILK  1 MH (SUTURE) ×2
SUT SILK 1 MH (SUTURE) IMPLANT
SUT SILK 2 0 SH (SUTURE) IMPLANT
SUT SILK 2 0 SH CR/8 (SUTURE) ×2 IMPLANT
SUT STEEL 6MS V (SUTURE) ×4 IMPLANT
SUT STEEL STERNAL CCS#1 18IN (SUTURE) IMPLANT
SUT STEEL SZ 6 DBL 3X14 BALL (SUTURE) IMPLANT
SUT VIC AB 1 CTX 36 (SUTURE) ×8
SUT VIC AB 1 CTX36XBRD ANBCTR (SUTURE) ×4 IMPLANT
SUT VIC AB 2-0 CT1 27 (SUTURE) ×4
SUT VIC AB 2-0 CT1 TAPERPNT 27 (SUTURE) IMPLANT
SUT VIC AB 2-0 CTX 27 (SUTURE) IMPLANT
SUT VIC AB 3-0 SH 27 (SUTURE)
SUT VIC AB 3-0 SH 27X BRD (SUTURE) IMPLANT
SUT VIC AB 3-0 X1 27 (SUTURE) ×2 IMPLANT
SUT VICRYL 4-0 PS2 18IN ABS (SUTURE) IMPLANT
SYSTEM SAHARA CHEST DRAIN ATS (WOUND CARE) ×4 IMPLANT
TAPE CLOTH SURG 4X10 WHT LF (GAUZE/BANDAGES/DRESSINGS) ×4 IMPLANT
TOWEL GREEN STERILE (TOWEL DISPOSABLE) ×4 IMPLANT
TOWEL GREEN STERILE FF (TOWEL DISPOSABLE) ×4 IMPLANT
TRAY FOLEY SLVR 16FR TEMP STAT (SET/KITS/TRAYS/PACK) ×4 IMPLANT
TUBING LAP HI FLOW INSUFFLATIO (TUBING) ×4 IMPLANT
UNDERPAD 30X30 (UNDERPADS AND DIAPERS) ×4 IMPLANT
WATER STERILE IRR 1000ML POUR (IV SOLUTION) ×8 IMPLANT

## 2018-07-10 NOTE — Progress Notes (Signed)
Pharmacy note: vancomycin  85 yo female s/p CABG. Vancomycin ordered post op x1 -per-op vancomycin given at ~ 8:30am today -Wt= 53kg, SCr= ~ 0.8  Plan -No further vancomycin needed (the last dose will cover for 24hours) -Will discontinue the post op vancomycin dose  Harland German, PharmD Clinical Pharmacist **Pharmacist phone directory can now be found on [email or web address] ([email or web address]).  Listed under [facility].

## 2018-07-10 NOTE — Progress Notes (Signed)
Called RT at 1628 to start vent weaning protocol.

## 2018-07-10 NOTE — Transfer of Care (Signed)
Immediate Anesthesia Transfer of Care Note  Patient: Leah Pittman  Procedure(s) Performed: CORONARY ARTERY BYPASS GRAFTING (CABG), FREE LIMA (N/A Chest) TRANSESOPHAGEAL ECHOCARDIOGRAM (TEE) (N/A )  Patient Location: SICU  Anesthesia Type:General  Level of Consciousness: sedated and Patient remains intubated per anesthesia plan  Airway & Oxygen Therapy: Patient remains intubated per anesthesia plan and Patient placed on Ventilator (see vital sign flow sheet for setting)  Post-op Assessment: Report given to RN and Post -op Vital signs reviewed and stable  Post vital signs: Reviewed and stable  Last Vitals:  Vitals Value Taken Time  BP 82/46 07/10/2018  2:19 PM  Temp 35.7 C 07/10/2018  2:25 PM  Pulse 75 07/10/2018  2:25 PM  Resp    SpO2 100 % 07/10/2018  2:25 PM  Vitals shown include unvalidated device data.  Last Pain:  Vitals:   07/10/18 0728  TempSrc:   PainSc: 0-No pain      Patients Stated Pain Goal: 0 (07/09/18 0936)  Complications: No apparent anesthesia complications

## 2018-07-10 NOTE — Progress Notes (Signed)
Pt belongings given to spouse Tessa Lerner, reviewed purse, jewerly meds and clothing, updated she was doing well this am when she went to OR and that she will go to [facility] post op and then 4E prior to discharge home. Todd asked the surgeon call their daughter Charlton Amor for update as she is able think of all the questions and will report to him. Left note in chart for treatment team

## 2018-07-10 NOTE — Brief Op Note (Signed)
07/08/2018 - 07/10/2018  1:16 PM  PATIENT:  Leah Pittman  85 y.o. female  PRE-OPERATIVE DIAGNOSIS:  CAD  POST-OPERATIVE DIAGNOSIS:  CAD  PROCEDURE:  Procedure(s): CORONARY ARTERY BYPASS GRAFTING (CABG), FREE LIMA (N/A) TRANSESOPHAGEAL ECHOCARDIOGRAM (TEE) (N/A) LIMA-LAD SEQ SVG-OM1-OM2 SVG-DIAG  SURGEON:  Surgeon(s) and Role:    * Bartle, Payton Doughty, MD - Primary  PHYSICIAN ASSISTANT: Lynnita Somma PA-C   ANESTHESIA:   general  EBL:  600 mL   BLOOD ADMINISTERED:SEE ANESTHESIA AND PERFUSION RECORDS  DRAINS: ROUTINE PLEURAL AND PERICARDIAL DRAINS   LOCAL MEDICATIONS USED:  NONE  SPECIMEN:  No Specimen  DISPOSITION OF SPECIMEN:  N/A  COUNTS:  YES  TOURNIQUET:  * No tourniquets in log *  DICTATION: .Dragon Dictation  PLAN OF CARE: Admit to inpatient   PATIENT DISPOSITION:  ICU - intubated and hemodynamically stable.   Delay start of Pharmacological VTE agent (>24hrs) due to surgical blood loss or risk of bleeding: yes

## 2018-07-10 NOTE — Anesthesia Procedure Notes (Signed)
Central Venous Catheter Insertion Performed by: Marcene Duos, MD, anesthesiologist Start/End5/20/2020 8:00 AM, 07/10/2018 8:10 AM Patient location: Pre-op. Preanesthetic checklist: patient identified, IV checked, site marked, risks and benefits discussed, surgical consent, monitors and equipment checked, pre-op evaluation, timeout performed and anesthesia consent Hand hygiene performed  and maximum sterile barriers used  PA cath was placed.Swan type:thermodilution PA Cath depth:40 Procedure performed without using ultrasound guided technique. Attempts: 1 Patient tolerated the procedure well with no immediate complications.

## 2018-07-10 NOTE — Progress Notes (Signed)
Patient ID: Leah Pittman, female   DOB: Feb 08, 1931, 85 y.o.   MRN: [id number]  TCTS Evening Rounds:   Hemodynamically stable  CI = 1.5 PAP low.  Has started to wake up on vent.   Urine output good  CT output low  CBC    Component Value Date/Time   WBC 13.4 (H) 07/10/2018 1425   RBC 2.92 (L) 07/10/2018 1425   HGB 7.8 (L) 07/10/2018 1536   HGB 12.0 07/05/2018 1221   HCT 23.0 (L) 07/10/2018 1536   HCT 35.4 07/05/2018 1221   PLT 100 (L) 07/10/2018 1425   PLT 251 07/05/2018 1221   MCV 91.4 07/10/2018 1425   MCV 92 07/05/2018 1221   MCH 30.5 07/10/2018 1425   MCHC 33.3 07/10/2018 1425   RDW 16.4 (H) 07/10/2018 1425   RDW 13.9 07/05/2018 1221     BMET    Component Value Date/Time   NA 142 07/10/2018 1536   NA 139 07/05/2018 1221   K 4.3 07/10/2018 1536   CL 106 07/10/2018 0421   CO2 23 07/10/2018 0421   GLUCOSE 116 (H) 07/10/2018 1427   BUN 19 07/10/2018 0421   BUN 17 07/05/2018 1221   CREATININE 0.78 07/10/2018 0421   CALCIUM 9.3 07/10/2018 0421   GFRNONAA >60 07/10/2018 0421   GFRAA >60 07/10/2018 0421     A/P:  Stable postop course. Continue current plans. Albumin for volume as needed.

## 2018-07-10 NOTE — Progress Notes (Signed)
Pt to OR in stable condtion, denies cp or sob, was able to walk to stretcher with slight weakness, pt has been NPO, report called to leanne in preop, belongings to go to security as pt has purse in bottom of suitcase

## 2018-07-10 NOTE — Progress Notes (Signed)
  Echocardiogram Echocardiogram Transesophageal has been performed.  Leah Pittman 07/10/2018, 10:05 AM

## 2018-07-10 NOTE — Anesthesia Procedure Notes (Signed)
Central Venous Catheter Insertion Performed by: Marcene Duos, MD, anesthesiologist Start/End5/20/2020 8:00 AM, 07/10/2018 8:10 AM Patient location: Pre-op. Preanesthetic checklist: patient identified, IV checked, site marked, risks and benefits discussed, surgical consent, monitors and equipment checked, pre-op evaluation, timeout performed and anesthesia consent Position: Trendelenburg Lidocaine 1% used for infiltration and patient sedated Hand hygiene performed , maximum sterile barriers used  and Seldinger technique used Catheter size: 8.5 Fr Total catheter length 10. Central line and PA cath was placed.Sheath introducer Swan type:thermodilution PA Cath depth:40 Procedure performed using ultrasound guided technique. Ultrasound Notes:anatomy identified, needle tip was noted to be adjacent to the nerve/plexus identified, no ultrasound evidence of intravascular and/or intraneural injection and image(s) printed for medical record Attempts: 1 Following insertion, line sutured, dressing applied and Biopatch. Post procedure assessment: blood return through all ports, free fluid flow and no air  Patient tolerated the procedure well with no immediate complications.

## 2018-07-10 NOTE — Op Note (Signed)
CARDIOVASCULAR SURGERY OPERATIVE NOTE  07/10/2018  Surgeon:  [name], MD  First Assistant: Gershon Crane,  PA-C   Preoperative Diagnosis:  Severe multi-vessel coronary artery disease   Postoperative Diagnosis:  Same   Procedure:  1. Median Sternotomy 2. Extracorporeal circulation 3.   Coronary artery bypass grafting x 4   Left internal mammary graft to the LAD  SVG to diagonal  Sequential SVG to OM1 and OM2  4.   Endoscopic vein harvest from the right leg   Anesthesia:  General Endotracheal   Clinical History/Surgical Indication:  Patient  is an 85 year old woman who has a few month history of chest heaviness and epigastric discomfort with exertion and occasionally at rest. This has been associated with shortness of breath and fatigue. Cath showed ostial 75% LM, 90% proximal to mid LAD, 95% proximal LCX, mild RCA disease. EF is normal by echo with no significant valvular abnormality. She is 85 but still independent at home and relatively active. I agree that CABG is the best option for her. I discussed the operative procedure with the patient including alternatives, benefits and risks; including but not limited to bleeding, blood transfusion, infection, stroke, myocardial infarction, graft failure, heart block requiring a permanent pacemaker, organ dysfunction, and death.  [name] understands and agrees to proceed.  Preparation:  The patient was seen in the preoperative holding area and the correct patient, correct operation were confirmed with the patient after reviewing the medical record and catheterization. The consent was signed by me. Preoperative antibiotics were given. A pulmonary arterial line and radial arterial line were placed by the anesthesia team. The patient was taken back to the operating room and positioned supine on the operating room table. After being  placed under general endotracheal anesthesia by the anesthesia team a foley catheter was placed. The neck, chest, abdomen, and both legs were prepped with betadine soap and solution and draped in the usual sterile manner. A surgical time-out was taken and the correct patient and operative procedure were confirmed with the nursing and anesthesia staff.   Cardiopulmonary Bypass:  A median sternotomy was performed. The pericardium was opened in the midline. Right ventricular function appeared normal. The ascending aorta was of normal size and had no palpable plaque. There were no contraindications to aortic cannulation or cross-clamping. The patient was fully systemically heparinized and the ACT was maintained > 400 sec. The proximal aortic arch was cannulated with a 20 F aortic cannula for arterial inflow. Venous cannulation was performed via the right atrial appendage using a two-staged venous cannula. An antegrade cardioplegia/vent cannula was inserted into the mid-ascending aorta. Aortic occlusion was performed with a single cross-clamp. Systemic cooling to 32 degrees Centigrade and topical cooling of the heart with iced saline were used. Hyperkalemic antegrade cold blood cardioplegia was used to induce diastolic arrest and was then given at about 20 minute intervals throughout the period of arrest to maintain myocardial temperature at or below 10 degrees centigrade. A temperature probe was inserted into the interventricular septum and an insulating pad was placed in the pericardium.   Left internal mammary harvest:  The left side of the sternum was retracted using the Rultract retractor. The left internal mammary artery was harvested as a pedicle graft. All side branches were clipped. It was a medium-sized vessel of good quality with excellent blood flow. It was ligated distally and divided. It was sprayed with topical papaverine solution to prevent vasospasm.   Endoscopic vein harvest:  The  right  greater saphenous vein was harvested endoscopically through a 2 cm incision medial to the right knee. It was harvested from the upper thigh to below the knee. It was a medium-sized vein of good quality. The side branches were all ligated with 4-0 silk ties.    Coronary arteries:  The coronary arteries were examined.   LAD:  Medium caliber vessel with no distal disease. The D1 was small but graftable.  LCX:  OM1 and OM2 both medium caliber vessels with no distal disease.  RCA:  Non-obstructive disease on cath.   Grafts:  1. LIMA to the LAD: 1.75 mm. It was sewn end to side using 8-0 prolene continuous suture. 2. SVG to D1:  1.5 mm. It was sewn end to side using 7-0 prolene continuous suture. 3. Sequential SVG to OM1:  1.6 mm. It was sewn sequential side to side using 7-0 prolene continuous suture. 4. Sequential SVG to OM2:  1.6 mm. It was sewn sequential end to side using 7-0 prolene continuous suture.  The proximal vein graft anastomoses were performed to the mid-ascending aorta using continuous 6-0 prolene suture. Graft markers were placed around the proximal anastomoses.  The LIMA graft was not long enough to reach the distal LAD as a pedicle graft so it was used as a free graft. The proximal end was divided and the stump suture ligated with 2-0 Silk suture. The proximal end of the LIMA was anastomosed to the diagonal vein graft proximally in end to side manner using continuous 7-0 prolene suture.   Completion:  The patient was rewarmed to 37 degrees Centigrade. The clamp was removed from the LIMA pedicle and there was rapid warming of the septum and return of ventricular fibrillation. The crossclamp was removed with a time of 107 minutes. There was spontaneous return of sinus rhythm. The distal and proximal anastomoses were checked for hemostasis. The position of the grafts was satisfactory. Two temporary epicardial pacing wires were placed on the right atrium and two on the right  ventricle. The patient was weaned from CPB without difficulty on no inotropes. CPB time was 125 minutes. Cardiac output was 4 LPM. TEE showed normal LV systolic function. Heparin was fully reversed with protamine and the aortic and venous cannulas removed. Hemostasis was achieved. Mediastinal and bilateral pleural drainage tubes were placed. The sternum was closed with  #6 stainless steel wires. The fascia was closed with continuous # 1 vicryl suture. The subcutaneous tissue was closed with 2-0 vicryl continuous suture. The skin was closed with 3-0 vicryl subcuticular suture. All sponge, needle, and instrument counts were reported correct at the end of the case. Dry sterile dressings were placed over the incisions and around the chest tubes which were connected to pleurevac suction. The patient was then transported to the surgical intensive care unit in stable condition.

## 2018-07-10 NOTE — Progress Notes (Signed)
Two white rings removed and given to Marylene Land, Charity fundraiser from [place].

## 2018-07-10 NOTE — Procedures (Signed)
Extubation Procedure Note  Patient Details:   Name: Leah Pittman DOB: [birth year] MRN: [id number]   Airway Documentation:    Vent end date: 07/10/18 Vent end time: 1935   Evaluation  O2 sats: stable throughout Complications: No apparent complications Patient did tolerate procedure well. Bilateral Breath Sounds: Clear, Diminished   Yes   Patient tolerated rapid wean. NIF -22 and VC 0.9 L. Positive for cuff leak. Patient extubated to a 4 Lpm nasal cannula. No signs of dyspnea or stridor noted. Patient instructed on the Incentive Spirometer achieving three times. RN at bedside. Will continue to monitor.   Ancil Boozer 07/10/2018, 9:51 PM

## 2018-07-10 NOTE — Progress Notes (Signed)
Pt started complaining of chest pain and chest discomfort on the left side of her chest. Vitals were stable and EKG was performed. Paged Cardiology and was told to increase Nitro drip to 10 mcg and to monitor the pt. Will continue to monitor.

## 2018-07-10 NOTE — Anesthesia Procedure Notes (Signed)
Arterial Line Insertion Start/End5/20/2020 7:45 AM, 07/10/2018 8:00 AM Performed by: Rosalio Macadamia, CRNA, CRNA  Patient location: Pre-op. Preanesthetic checklist: patient identified, IV checked, site marked, risks and benefits discussed, surgical consent, monitors and equipment checked, pre-op evaluation, timeout performed and anesthesia consent Lidocaine 1% used for infiltration and patient sedated Left, radial was placed Catheter size: 20 G Hand hygiene performed  and maximum sterile barriers used   Attempts: 2 Procedure performed without using ultrasound guided technique.

## 2018-07-10 NOTE — Progress Notes (Signed)
Spouse called and will pick up belongings, silver band and engagement ring in plastic container, pt has black purse with wallet $20, $5, 5 $1 bills, belks card, aarp and various discount cards, pt had black bag of home meds, IS, bactroban tube, clothing

## 2018-07-11 ENCOUNTER — Encounter (HOSPITAL_COMMUNITY): Payer: Self-pay | Admitting: Surgery

## 2018-07-11 ENCOUNTER — Inpatient Hospital Stay (HOSPITAL_COMMUNITY): Payer: Medicare Other

## 2018-07-11 LAB — GLUCOSE, CAPILLARY
Glucose-Capillary: 120 mg/dL — ABNORMAL HIGH (ref 70–99)
Glucose-Capillary: 107 mg/dL — ABNORMAL HIGH (ref 70–99)
Glucose-Capillary: 112 mg/dL — ABNORMAL HIGH (ref 70–99)
Glucose-Capillary: 120 mg/dL — ABNORMAL HIGH (ref 70–99)
Glucose-Capillary: 123 mg/dL — ABNORMAL HIGH (ref 70–99)
Glucose-Capillary: 131 mg/dL — ABNORMAL HIGH (ref 70–99)
Glucose-Capillary: 134 mg/dL — ABNORMAL HIGH (ref 70–99)
Glucose-Capillary: 138 mg/dL — ABNORMAL HIGH (ref 70–99)
Glucose-Capillary: 145 mg/dL — ABNORMAL HIGH (ref 70–99)
Glucose-Capillary: 146 mg/dL — ABNORMAL HIGH (ref 70–99)
Glucose-Capillary: 88 mg/dL (ref 70–99)
Glucose-Capillary: 105 mg/dL — ABNORMAL HIGH (ref 70–99)
Glucose-Capillary: 107 mg/dL — ABNORMAL HIGH (ref 70–99)
Glucose-Capillary: 115 mg/dL — ABNORMAL HIGH (ref 70–99)
Glucose-Capillary: 101 mg/dL — ABNORMAL HIGH (ref 70–99)
Glucose-Capillary: 103 mg/dL — ABNORMAL HIGH (ref 70–99)
Glucose-Capillary: 92 mg/dL (ref 70–99)
Glucose-Capillary: 88 mg/dL (ref 70–99)
Glucose-Capillary: 115 mg/dL — ABNORMAL HIGH (ref 70–99)
Glucose-Capillary: 115 mg/dL — ABNORMAL HIGH (ref 70–99)

## 2018-07-11 LAB — BASIC METABOLIC PANEL
Anion gap: 5 (ref 5–15)
Anion gap: 7 (ref 5–15)
BUN: 12 mg/dL (ref 8–23)
BUN: 15 mg/dL (ref 8–23)
CO2: 20 mmol/L — ABNORMAL LOW (ref 22–32)
CO2: 22 mmol/L (ref 22–32)
Calcium: 7.7 mg/dL — ABNORMAL LOW (ref 8.9–10.3)
Calcium: 8.5 mg/dL — ABNORMAL LOW (ref 8.9–10.3)
Chloride: 112 mmol/L — ABNORMAL HIGH (ref 98–111)
Chloride: 110 mmol/L (ref 98–111)
Creatinine, Ser: 0.72 mg/dL (ref 0.44–1.00)
Creatinine, Ser: 0.84 mg/dL (ref 0.44–1.00)
GFR calc Af Amer: >60 mL/min (ref >60)
GFR calc Af Amer: >60 mL/min (ref >60)
GFR calc non Af Amer: >60 mL/min (ref >60)
GFR calc non Af Amer: >60 mL/min (ref >60)
Glucose, Bld: 162 mg/dL — ABNORMAL HIGH (ref 70–99)
Glucose, Bld: 139 mg/dL — ABNORMAL HIGH (ref 70–99)
Potassium: 4.4 mmol/L (ref 3.5–5.1)
Potassium: 3.9 mmol/L (ref 3.5–5.1)
Sodium: 137 mmol/L (ref 135–145)
Sodium: 139 mmol/L (ref 135–145)

## 2018-07-11 LAB — CBC
HCT: 21.7 % — ABNORMAL LOW (ref 36.0–46.0)
HCT: 23.3 % — ABNORMAL LOW (ref 36.0–46.0)
HCT: 25.9 % — ABNORMAL LOW (ref 36.0–46.0)
Hemoglobin: 7.3 g/dL — ABNORMAL LOW (ref 12.0–15.0)
Hemoglobin: 8.1 g/dL — ABNORMAL LOW (ref 12.0–15.0)
Hemoglobin: 9.3 g/dL — ABNORMAL LOW (ref 12.0–15.0)
MCH: 30.5 pg (ref 26.0–34.0)
MCH: 29.9 pg (ref 26.0–34.0)
MCH: 30.4 pg (ref 26.0–34.0)
MCHC: 33.6 g/dL (ref 30.0–36.0)
MCHC: 34.8 g/dL (ref 30.0–36.0)
MCHC: 35.9 g/dL (ref 30.0–36.0)
MCV: 90.8 fL (ref 80.0–100.0)
MCV: 86 fL (ref 80.0–100.0)
MCV: 84.6 fL (ref 80.0–100.0)
Platelets: 85 10*3/uL — ABNORMAL LOW (ref 150–400)
Platelets: 85 10*3/uL — ABNORMAL LOW (ref 150–400)
Platelets: 84 10*3/uL — ABNORMAL LOW (ref 150–400)
RBC: 2.39 MIL/uL — ABNORMAL LOW (ref 3.87–5.11)
RBC: 2.71 MIL/uL — ABNORMAL LOW (ref 3.87–5.11)
RBC: 3.06 MIL/uL — ABNORMAL LOW (ref 3.87–5.11)
RDW: 17.3 % — ABNORMAL HIGH (ref 11.5–15.5)
RDW: 17 % — ABNORMAL HIGH (ref 11.5–15.5)
RDW: 16.3 % — ABNORMAL HIGH (ref 11.5–15.5)
WBC: 11 10*3/uL — ABNORMAL HIGH (ref 4.0–10.5)
WBC: 10 10*3/uL (ref 4.0–10.5)
WBC: 14.4 10*3/uL — ABNORMAL HIGH (ref 4.0–10.5)
nRBC: 0 % (ref 0.0–0.2)
nRBC: 0 % (ref 0.0–0.2)
nRBC: 0 % (ref 0.0–0.2)

## 2018-07-11 LAB — MAGNESIUM
Magnesium: 2.9 mg/dL — ABNORMAL HIGH (ref 1.7–2.4)
Magnesium: 2.4 mg/dL (ref 1.7–2.4)

## 2018-07-11 LAB — PREPARE RBC (CROSSMATCH)

## 2018-07-11 MED ORDER — DORZOLAMIDE HCL-TIMOLOL MAL 2-0.5 % OP SOLN
1.0000 [drp] | Freq: Two times a day (BID) | OPHTHALMIC | Status: DC
  Administered 2018-07-11 – 2018-07-19 (×17): 1 [drp] via OPHTHALMIC
  Filled 2018-07-11: qty 10

## 2018-07-11 MED ORDER — SODIUM CHLORIDE 0.9% IV SOLUTION: Freq: Once | INTRAVENOUS | Status: DC

## 2018-07-11 MED ORDER — LORAZEPAM 0.5 MG PO TABS
0.5000 mg | ORAL_TABLET | Freq: Every evening | ORAL | Status: DC | PRN
  Administered 2018-07-11 – 2018-07-18 (×8): 0.5 mg via ORAL
  Filled 2018-07-11 (×8): qty 1

## 2018-07-11 MED ORDER — FUROSEMIDE 10 MG/ML IJ SOLN
40.0000 mg | Freq: Once | INTRAMUSCULAR | Status: AC
  Administered 2018-07-11: 40 mg via INTRAVENOUS
  Filled 2018-07-11: qty 4

## 2018-07-11 MED ORDER — INSULIN ASPART 100 UNIT/ML ~~LOC~~ SOLN: 0.0000 [IU] | SUBCUTANEOUS | Status: DC

## 2018-07-11 MED ORDER — TRAMADOL HCL 50 MG PO TABS
50.0000 mg | ORAL_TABLET | ORAL | Status: DC | PRN
  Administered 2018-07-11: 50 mg via ORAL
  Filled 2018-07-11: qty 1

## 2018-07-11 MED ORDER — INSULIN DETEMIR 100 UNIT/ML ~~LOC~~ SOLN
10.0000 [IU] | Freq: Once | SUBCUTANEOUS | Status: AC
  Administered 2018-07-11: 10 [IU] via SUBCUTANEOUS
  Filled 2018-07-11 (×2): qty 0.1

## 2018-07-11 MED FILL — Potassium Chloride Inj 2 mEq/ML: INTRAVENOUS | Qty: 40 | Status: AC

## 2018-07-11 MED FILL — Thrombin (Recombinant) For Soln 20000 Unit: CUTANEOUS | Qty: 1 | Status: AC

## 2018-07-11 MED FILL — Heparin Sodium (Porcine) Inj 1000 Unit/ML: INTRAMUSCULAR | Qty: 30 | Status: AC

## 2018-07-11 MED FILL — Magnesium Sulfate Inj 50%: INTRAMUSCULAR | Qty: 10 | Status: AC

## 2018-07-11 NOTE — Progress Notes (Signed)
1 Day Post-Op Procedure(s) (LRB): CORONARY ARTERY BYPASS GRAFTING (CABG), FREE LIMA (N/A) TRANSESOPHAGEAL ECHOCARDIOGRAM (TEE) (N/A) Subjective: Complains of incisional pain and pain in left arm probably from IV, arterial line.  Objective: Vital signs in last 24 hours: Temp:  [95.9 F (35.5 C)-99.3 F (37.4 C)] 99.1 F (37.3 C) (05/21 0700) Pulse Rate:  [71-86] 80 (05/21 0700) Cardiac Rhythm: Atrial paced (05/21 0000) Resp:  [0-26] 18 (05/21 0700) BP: (84-149)/(32-75) 114/46 (05/21 0700) SpO2:  [94 %-100 %] 100 % (05/21 0700) Arterial Line BP: (95-162)/(31-75) 136/46 (05/21 0700) FiO2 (%):  [40 %-50 %] 40 % (05/20 1836) Weight:  [51.5 kg] 51.5 kg (05/21 0500)  Hemodynamic parameters for last 24 hours: PAP: (16-33)/(3-17) 21/6 CO:  [2 L/min-2.8 L/min] 2.8 L/min CI:  [1.3 L/min/m2-1.8 L/min/m2] 1.8 L/min/m2  Intake/Output from previous day: 05/20 0701 - 05/21 0700 In: 5038.7 [I.V.:3064.4; Blood:1074.3; IV Piggyback:900.1] Out: 4718 [Urine:2550; Blood:600; Chest Tube:1568] Intake/Output this shift: No intake/output data recorded.  General appearance: cooperative and anxious Neurologic: intact Heart: regular rate and rhythm, S1, S2 normal, no murmur, click, rub or gallop Lungs: clear to auscultation bilaterally Extremities: edema mild Wound: dressings dry  Lab Results: Recent Labs    07/10/18 1943  07/10/18 2042 07/11/18 0212  WBC 11.0*  --   --  10.0  HGB 7.3*   < > 5.4* 8.1*  HCT 21.7*   < > 16.0* 23.3*  PLT 85*  --   --  85*   < > = values in this interval not displayed.   BMET:  Recent Labs    07/10/18 0421  07/10/18 1950 07/10/18 2042 07/11/18 0212  NA 138   < > 140 141 137  K 3.9   < > 4.7 4.7 4.4  CL 106  --  109  --  112*  CO2 23  --   --   --  20*  GLUCOSE 108*   < > 150*  --  162*  BUN 19  --  11  --  12  CREATININE 0.78   < > 0.40*  --  0.72  CALCIUM 9.3  --   --   --  7.7*   < > = values in this interval not displayed.    PT/INR:  Recent  Labs    07/10/18 1425  LABPROT 19.5*  INR 1.7*   ABG    Component Value Date/Time   PHART 7.416 07/10/2018 2042   HCO3 22.0 07/10/2018 2042   TCO2 23 07/10/2018 2042   ACIDBASEDEF 2.0 07/10/2018 2042   O2SAT 100.0 07/10/2018 2042   CBG (last 3)  Recent Labs    07/11/18 0438 07/11/18 0542 07/11/18 0647  GLUCAP 107* 120* 112*   CXR: mild left base atelectasis  ECG: sinus, no acute changes  Assessment/Plan: S/P Procedure(s) (LRB): CORONARY ARTERY BYPASS GRAFTING (CABG), FREE LIMA (N/A) TRANSESOPHAGEAL ECHOCARDIOGRAM (TEE) (N/A)  POD 1 Hemodynamically stable in sinus rhythm. Continue low dose Lopressor.  DC chest tubes and swan, arterial line.  Acute postop blood loss anemia: still on some neo and 85 years old so will give her a unit of PRBC's this am. Start iron when taking po.  Volume excess: diurese  DM: preop Hgb A1c was 5.8 on no meds. Still on low dose insulin drip so will give her a dose of Levemir this am and start SSI. Probably off insulin by tomorrow.  OOB, IS.   LOS: 3 days    [name] 07/11/2018

## 2018-07-11 NOTE — Plan of Care (Signed)
Patient alert and oriented, extubated at 1935. Complaints of pain, states she issues with so many medicines, very hesitant to take anything.  Reassured patient that she was being monitored closely and if there were any issues that we would be able to address them quickly to prevent any further problems.  Agreed to take morphine.  Tolerated it well.  Will continue to monitor.

## 2018-07-11 NOTE — Progress Notes (Signed)
Patient ID: Leah Pittman, female   DOB: Apr 24, 1930, 85 y.o.   MRN: [id number] EVENING ROUNDS NOTE :     [address]       [place] [id number]             [phone]                 1 Day Post-Op Procedure(s) (LRB): CORONARY ARTERY BYPASS GRAFTING (CABG), FREE LIMA (N/A) TRANSESOPHAGEAL ECHOCARDIOGRAM (TEE) (N/A)  Total Length of Stay:  LOS: 3 days  BP (!) 96/59 (BP Location: Left Arm)   Pulse 76   Temp 98.3 F (36.8 C) (Oral)   Resp 20   Ht 5\' 4"  (1.626 m)   Wt 51.5 kg   SpO2 (!) 87%   BMI 19.47 kg/m   .Intake/Output      05/20 0701 - 05/21 0700 05/21 0701 - 05/22 0700   P.O.  100   I.V. (mL/kg) 3064.4 (59.5) 70.2 (1.4)   Blood 1074.3 368   IV Piggyback 900.1    Total Intake(mL/kg) 5038.7 (97.8) 538.2 (10.4)   Urine (mL/kg/hr) 2550 (2.1) 240 (0.4)   Blood 600    Chest Tube 1568 100   Total Output 4718 340   Net +320.7 +198.2          . sodium chloride 250 mL (07/11/18 0608)  . cefUROXime (ZINACEF)  IV 1.5 g (07/11/18 1734)  . lactated ringers Stopped (07/10/18 1511)  . lactated ringers 20 mL/hr at 07/10/18 1501  . nitroGLYCERIN Stopped (07/10/18 1456)  . phenylephrine (NEO-SYNEPHRINE) Adult infusion Stopped (07/11/18 0851)     Lab Results  Component Value Date   WBC 10.0 07/11/2018   HGB 8.1 (L) 07/11/2018   HCT 23.3 (L) 07/11/2018   PLT 85 (L) 07/11/2018   GLUCOSE 162 (H) 07/11/2018   CHOL 163 07/09/2018   TRIG 77 07/09/2018   HDL 57 07/09/2018   LDLCALC 91 07/09/2018   ALT 26 05/10/2018   AST 24 05/10/2018   NA 137 07/11/2018   K 4.4 07/11/2018   CL 112 (H) 07/11/2018   CREATININE 0.72 07/11/2018   BUN 12 07/11/2018   CO2 20 (L) 07/11/2018   INR 1.7 (H) 07/10/2018   HGBA1C 5.8 (H) 07/10/2018   Complaining of left arm pain Also nausea  Sinus     Delight Ovens MD  Beeper [phone] Office [phone] 07/11/2018 5:38 PM

## 2018-07-12 ENCOUNTER — Inpatient Hospital Stay (HOSPITAL_COMMUNITY): Payer: Medicare Other

## 2018-07-12 LAB — BASIC METABOLIC PANEL
Anion gap: 6 (ref 5–15)
BUN: 22 mg/dL (ref 8–23)
CO2: 24 mmol/L (ref 22–32)
Calcium: 8.5 mg/dL — ABNORMAL LOW (ref 8.9–10.3)
Chloride: 110 mmol/L (ref 98–111)
Creatinine, Ser: 0.74 mg/dL (ref 0.44–1.00)
GFR calc Af Amer: >60 mL/min (ref >60)
GFR calc non Af Amer: >60 mL/min (ref >60)
Glucose, Bld: 121 mg/dL — ABNORMAL HIGH (ref 70–99)
Potassium: 4.2 mmol/L (ref 3.5–5.1)
Sodium: 140 mmol/L (ref 135–145)

## 2018-07-12 LAB — GLUCOSE, CAPILLARY
Glucose-Capillary: 103 mg/dL — ABNORMAL HIGH (ref 70–99)
Glucose-Capillary: 104 mg/dL — ABNORMAL HIGH (ref 70–99)
Glucose-Capillary: 111 mg/dL — ABNORMAL HIGH (ref 70–99)
Glucose-Capillary: 97 mg/dL (ref 70–99)

## 2018-07-12 LAB — TYPE AND SCREEN
ABO/RH(D): AB POS
Antibody Screen: NEGATIVE
Unit division: 0
Unit division: 0
Unit division: 0
Unit division: 0

## 2018-07-12 LAB — BPAM RBC
Blood Product Expiration Date: 202005232359
Blood Product Expiration Date: 202005262359
Blood Product Expiration Date: 202005312359
Blood Product Expiration Date: 202006012359
ISSUE DATE / TIME: 202005200926
ISSUE DATE / TIME: 202005200926
ISSUE DATE / TIME: 202005202118
ISSUE DATE / TIME: 202005210942
Unit Type and Rh: 6200
Unit Type and Rh: 8400
Unit Type and Rh: 8400
Unit Type and Rh: 8400

## 2018-07-12 LAB — CBC
HCT: 23.5 % — ABNORMAL LOW (ref 36.0–46.0)
Hemoglobin: 8.5 g/dL — ABNORMAL LOW (ref 12.0–15.0)
MCH: 30.6 pg (ref 26.0–34.0)
MCHC: 36.2 g/dL — ABNORMAL HIGH (ref 30.0–36.0)
MCV: 84.5 fL (ref 80.0–100.0)
Platelets: 87 10*3/uL — ABNORMAL LOW (ref 150–400)
RBC: 2.78 MIL/uL — ABNORMAL LOW (ref 3.87–5.11)
RDW: 16.3 % — ABNORMAL HIGH (ref 11.5–15.5)
WBC: 14.3 10*3/uL — ABNORMAL HIGH (ref 4.0–10.5)
nRBC: 0 % (ref 0.0–0.2)

## 2018-07-12 MED ORDER — ASPIRIN 81 MG PO CHEW: 81.0000 mg | CHEWABLE_TABLET | Freq: Every day | ORAL | Status: DC

## 2018-07-12 MED ORDER — ASPIRIN EC 81 MG PO TBEC
81.0000 mg | DELAYED_RELEASE_TABLET | Freq: Every day | ORAL | Status: DC
  Administered 2018-07-12 – 2018-07-19 (×8): 81 mg via ORAL
  Filled 2018-07-12 (×8): qty 1

## 2018-07-12 MED ORDER — ORAL CARE MOUTH RINSE
15.0000 mL | Freq: Two times a day (BID) | OROMUCOSAL | Status: DC
  Administered 2018-07-12 – 2018-07-19 (×10): 15 mL via OROMUCOSAL

## 2018-07-12 MED ORDER — AMIODARONE HCL 200 MG PO TABS
200.0000 mg | ORAL_TABLET | Freq: Two times a day (BID) | ORAL | Status: DC
  Administered 2018-07-12 – 2018-07-19 (×15): 200 mg via ORAL
  Filled 2018-07-12 (×15): qty 1

## 2018-07-12 MED ORDER — FUROSEMIDE 10 MG/ML IJ SOLN
40.0000 mg | Freq: Once | INTRAMUSCULAR | Status: AC
  Administered 2018-07-12: 40 mg via INTRAVENOUS
  Filled 2018-07-12: qty 4

## 2018-07-12 MED ORDER — BOOST / RESOURCE BREEZE PO LIQD CUSTOM
1.0000 | Freq: Three times a day (TID) | ORAL | Status: DC
  Administered 2018-07-12 – 2018-07-18 (×9): 1 via ORAL

## 2018-07-12 MED ORDER — AMIODARONE IV BOLUS ONLY 150 MG/100ML
150.0000 mg | Freq: Once | INTRAVENOUS | Status: AC
  Administered 2018-07-12: 150 mg via INTRAVENOUS
  Filled 2018-07-12: qty 100

## 2018-07-12 MED ORDER — METOCLOPRAMIDE HCL 5 MG/ML IJ SOLN
10.0000 mg | Freq: Four times a day (QID) | INTRAMUSCULAR | Status: AC
  Filled 2018-07-12: qty 2

## 2018-07-12 MED ORDER — FE FUMARATE-B12-VIT C-FA-IFC PO CAPS
1.0000 | ORAL_CAPSULE | Freq: Two times a day (BID) | ORAL | Status: DC
  Administered 2018-07-12 – 2018-07-13 (×2): 1 via ORAL
  Filled 2018-07-12: qty 1

## 2018-07-12 MED ORDER — CHLORHEXIDINE GLUCONATE CLOTH 2 % EX PADS
6.0000 | MEDICATED_PAD | Freq: Every day | CUTANEOUS | Status: DC
  Administered 2018-07-12 – 2018-07-18 (×7): 6 via TOPICAL

## 2018-07-12 NOTE — Progress Notes (Signed)
Initial Nutrition Assessment  RD working remotely.  DOCUMENTATION CODES:   Not applicable  INTERVENTION:   - Boost Breeze po TID, each supplement provides 250 kcal and 9 grams of protein  NUTRITION DIAGNOSIS:   Inadequate oral intake related to nausea, vomiting as evidenced by meal completion < 25%, other (chart review).  GOAL:   Patient will meet greater than or equal to 90% of their needs  MONITOR:   PO intake, Supplement acceptance, Diet advancement, Labs, Weight trends  REASON FOR ASSESSMENT:   NPO/Clear Liquid Diet    ASSESSMENT:   85 year old female who presented on 5/19 for cardiac catheterization. PMH of HLD, HTN.   5/19 - cardiac catheterization, episode of N/V and bradycardia 5/20 - s/p CABG x 4  Noted pt with episodes of nausea today. Pt remains on clear liquids post-op.  Reviewed RN edema assessment. Pt with generalized non-pitting edema.  Attempted to speak with pt via phone call to room. "All circuits are busy" memo received on all attempts. RD to order Boost Breeze oral nutrition supplement to aid pt in meeting kcal and protein needs while on restrictive clear liquid diet. Will monitor for diet advancement and adjust supplement regimen as appropriate.  Reviewed weight history in chart. Weight appears stable between 50-52 kg over the last year. Current weight of 56.6 kg likely elevated related to fluid shifts. Weight on admission was 51.4 kg.  Meal Completion: 0%, 15% x 2 meals on 5/21  Medications reviewed and include: Dulcolax, Colace, Trinsicon 1 capsule BID, IV Reglan 10 mg q 6 hours, Protonix IVF: LR @ 10 ml/hr  Labs reviewed: hemoglobin 8.5 (L) CBG's: 104, 103, 111, 115, 115 x 24 hours  UOP: 1310 ml x 24 hours CT: 100 ml x 24 hours I/O's: -1.3 L since admit  NUTRITION - FOCUSED PHYSICAL EXAM:  Unable to complete at this time. RD working remotely.  Diet Order:   Diet Order            Diet clear liquid Room service appropriate? Yes;  Fluid consistency: Thin  Diet effective now              EDUCATION NEEDS:   No education needs have been identified at this time  Skin:  Skin Assessment: Skin Integrity Issues: Other: surgical incisions to chest and right leg  Last BM:  07/12/18  Height:   Ht Readings from Last 1 Encounters:  07/12/18 5\' 4"  (1.626 m)    Weight:   Wt Readings from Last 1 Encounters:  07/12/18 56.6 kg    Ideal Body Weight:  54.5 kg  BMI:  Body mass index is 21.42 kg/m.  Estimated Nutritional Needs:   Kcal:  1350-1550  Protein:  60-75 grams  Fluid:  1.4-1.6 L    Earma Reading, MS, RD, LDN Inpatient Clinical Dietitian Pager: [phone] Weekend/After Hours: [phone]

## 2018-07-12 NOTE — Progress Notes (Signed)
TCTS BRIEF SICU PROGRESS NOTE  2 Days Post-Op  S/P Procedure(s) (LRB): CORONARY ARTERY BYPASS GRAFTING (CABG), FREE LIMA (N/A) TRANSESOPHAGEAL ECHOCARDIOGRAM (TEE) (N/A)   Currently Afib w/ HR 100-110 BP stable Breathing comfortably on 2 L/min  Plan: Continue current plan  Purcell Nails, MD 07/12/2018 5:18 PM

## 2018-07-12 NOTE — Progress Notes (Signed)
2 Days Post-Op Procedure(s) (LRB): CORONARY ARTERY BYPASS GRAFTING (CABG), FREE LIMA (N/A) TRANSESOPHAGEAL ECHOCARDIOGRAM (TEE) (N/A) Subjective: Complains of nausea. Feels like it may be related to pain meds. Tried to ambulate this am but felt bad.  Objective: Vital signs in last 24 hours: Temp:  [97.7 F (36.5 C)-98.4 F (36.9 C)] 97.7 F (36.5 C) (05/22 0730) Pulse Rate:  [67-85] 77 (05/22 0600) Cardiac Rhythm: Normal sinus rhythm (05/22 0000) Resp:  [15-25] 18 (05/22 0700) BP: (88-141)/(44-75) 129/51 (05/22 0700) SpO2:  [86 %-100 %] 99 % (05/22 0600) Arterial Line BP: (109-168)/(41-69) 161/57 (05/21 1010) Weight:  [56.6 kg] 56.6 kg (05/22 0600)  Hemodynamic parameters for last 24 hours:    Intake/Output from previous day: 05/21 0701 - 05/22 0700 In: 865.5 [P.O.:100; I.V.:271.1; Blood:368; IV Piggyback:126.4] Out: 1410 [Urine:1310; Chest Tube:100] Intake/Output this shift: No intake/output data recorded.  General appearance: alert and cooperative Neurologic: intact Heart: regular rate and rhythm, S1, S2 normal, no murmur, click, rub or gallop Lungs: diminished breath sounds bibasilar Abdomen: soft, non-tender; bowel sounds normal Extremities: edema mild Wound: incisions ok  Lab Results: Recent Labs    07/11/18 1700 07/12/18 0449  WBC 14.4* 14.3*  HGB 9.3* 8.5*  HCT 25.9* 23.5*  PLT 84* 87*   BMET:  Recent Labs    07/11/18 1700 07/12/18 0449  NA 139 140  K 3.9 4.2  CL 110 110  CO2 22 24  GLUCOSE 139* 121*  BUN 15 22  CREATININE 0.84 0.74  CALCIUM 8.5* 8.5*    PT/INR:  Recent Labs    07/10/18 1425  LABPROT 19.5*  INR 1.7*   ABG    Component Value Date/Time   PHART 7.416 07/10/2018 2042   HCO3 22.0 07/10/2018 2042   TCO2 23 07/10/2018 2042   ACIDBASEDEF 2.0 07/10/2018 2042   O2SAT 100.0 07/10/2018 2042   CBG (last 3)  Recent Labs    07/11/18 2340 07/12/18 0507 07/12/18 0732  GLUCAP 111* 103* 104*   CXR: left basilar atelectasis and  small effusion.  Assessment/Plan: S/P Procedure(s) (LRB): CORONARY ARTERY BYPASS GRAFTING (CABG), FREE LIMA (N/A) TRANSESOPHAGEAL ECHOCARDIOGRAM (TEE) (N/A)  POD 2  Hemodynamically stable in sinus rhythm. Continue low dose Lopressor.  Nausea: she had this preop and it wasn't clear if this was related to ischemia or something GI. Will stop narcotics and use Tylenol for pain. Add Reglan for another day. Continue Protonix.  Glucose under good control. No DM. Stop CBG's  Volume excess: continue diuresis.  Expected postop blood loss anemia: start iron.  Continue IS, ambulate as tolerated.  Will keep in ICU today due to frailty and need for a lot of attention to mobilize.   LOS: 4 days    Leah Pittman 07/12/2018

## 2018-07-12 NOTE — Anesthesia Postprocedure Evaluation (Signed)
Anesthesia Post Note  Patient: Leah Pittman  Procedure(s) Performed: CORONARY ARTERY BYPASS GRAFTING (CABG), FREE LIMA (N/A Chest) TRANSESOPHAGEAL ECHOCARDIOGRAM (TEE) (N/A )     Patient location during evaluation: SICU Anesthesia Type: General Level of consciousness: sedated Pain management: pain level controlled Vital Signs Assessment: post-procedure vital signs reviewed and stable Respiratory status: patient remains intubated per anesthesia plan Cardiovascular status: stable Postop Assessment: no apparent nausea or vomiting Anesthetic complications: no    Last Vitals:  Vitals:   07/12/18 0700 07/12/18 0730  BP: (!) 129/51   Pulse:    Resp: 18   Temp:  36.5 C  SpO2:      Last Pain:  Vitals:   07/12/18 0400  TempSrc: Oral  PainSc:                  [name]

## 2018-07-13 LAB — CBC
HCT: 23.5 % — ABNORMAL LOW (ref 36.0–46.0)
Hemoglobin: 8.1 g/dL — ABNORMAL LOW (ref 12.0–15.0)
MCH: 30.2 pg (ref 26.0–34.0)
MCHC: 34.5 g/dL (ref 30.0–36.0)
MCV: 87.7 fL (ref 80.0–100.0)
Platelets: 131 10*3/uL — ABNORMAL LOW (ref 150–400)
RBC: 2.68 MIL/uL — ABNORMAL LOW (ref 3.87–5.11)
RDW: 16.2 % — ABNORMAL HIGH (ref 11.5–15.5)
WBC: 14.6 10*3/uL — ABNORMAL HIGH (ref 4.0–10.5)
nRBC: 0 % (ref 0.0–0.2)

## 2018-07-13 LAB — BASIC METABOLIC PANEL
Anion gap: 7 (ref 5–15)
BUN: 27 mg/dL — ABNORMAL HIGH (ref 8–23)
CO2: 23 mmol/L (ref 22–32)
Calcium: 8.6 mg/dL — ABNORMAL LOW (ref 8.9–10.3)
Chloride: 106 mmol/L (ref 98–111)
Creatinine, Ser: 0.76 mg/dL (ref 0.44–1.00)
GFR calc Af Amer: >60 mL/min (ref >60)
GFR calc non Af Amer: >60 mL/min (ref >60)
Glucose, Bld: 123 mg/dL — ABNORMAL HIGH (ref 70–99)
Potassium: 3.2 mmol/L — ABNORMAL LOW (ref 3.5–5.1)
Sodium: 136 mmol/L (ref 135–145)

## 2018-07-13 MED ORDER — MOVING RIGHT ALONG BOOK
Freq: Once | Status: AC
  Administered 2018-07-13: 1
  Filled 2018-07-13: qty 1

## 2018-07-13 MED ORDER — LATANOPROST 0.005 % OP SOLN
1.0000 [drp] | Freq: Every day | OPHTHALMIC | Status: DC
  Administered 2018-07-13 – 2018-07-18 (×6): 1 [drp] via OPHTHALMIC
  Filled 2018-07-13: qty 2.5

## 2018-07-13 MED ORDER — ENSURE ENLIVE PO LIQD
237.0000 mL | Freq: Three times a day (TID) | ORAL | Status: DC
  Administered 2018-07-13 – 2018-07-18 (×8): 237 mL via ORAL

## 2018-07-13 MED ORDER — POTASSIUM CHLORIDE 10 MEQ/50ML IV SOLN
10.0000 meq | INTRAVENOUS | Status: AC
  Administered 2018-07-13 (×2): 10 meq via INTRAVENOUS
  Filled 2018-07-13 (×2): qty 50

## 2018-07-13 MED ORDER — POTASSIUM CHLORIDE 10 MEQ/50ML IV SOLN
10.0000 meq | INTRAVENOUS | Status: DC
  Administered 2018-07-13 (×3): 10 meq via INTRAVENOUS
  Filled 2018-07-13 (×3): qty 50

## 2018-07-13 MED ORDER — POTASSIUM CHLORIDE 10 MEQ/50ML IV SOLN
10.0000 meq | INTRAVENOUS | Status: AC
  Administered 2018-07-13: 10 meq via INTRAVENOUS
  Filled 2018-07-13 (×2): qty 50

## 2018-07-13 MED ORDER — METOCLOPRAMIDE HCL 5 MG/ML IJ SOLN
10.0000 mg | Freq: Four times a day (QID) | INTRAMUSCULAR | Status: AC
  Administered 2018-07-13 – 2018-07-14 (×4): 10 mg via INTRAVENOUS
  Filled 2018-07-13 (×4): qty 2

## 2018-07-13 MED ORDER — FE FUMARATE-B12-VIT C-FA-IFC PO CAPS
1.0000 | ORAL_CAPSULE | Freq: Two times a day (BID) | ORAL | Status: DC
  Administered 2018-07-14 – 2018-07-18 (×10): 1 via ORAL
  Filled 2018-07-13 (×12): qty 1

## 2018-07-13 NOTE — Progress Notes (Signed)
   Looks to be in NSR.  We will follow for assistance with discharge planning.

## 2018-07-13 NOTE — Progress Notes (Signed)
      [address]       Leah Pittman [id number]             [phone]        CARDIOTHORACIC SURGERY PROGRESS NOTE   R3 Days Post-Op Procedure(s) (LRB): CORONARY ARTERY BYPASS GRAFTING (CABG), FREE LIMA (N/A) TRANSESOPHAGEAL ECHOCARDIOGRAM (TEE) (N/A)  Subjective: Feels "terrible".  Weak.  Tired.  Nauseous but tolerating clear liquids.  Arm is sore.  Anxious.  Objective: Vital signs: BP Readings from Last 1 Encounters:  07/13/18 (!) 110/43   Pulse Readings from Last 1 Encounters:  07/13/18 82   Resp Readings from Last 1 Encounters:  07/13/18 (!) 22   Temp Readings from Last 1 Encounters:  07/13/18 98.3 F (36.8 C) (Oral)    Hemodynamics:    Physical Exam:  Rhythm:   sinus  Breath sounds: clear  Heart sounds:  RRR  Incisions:  Clean and dry  Abdomen:  Soft, non-distended, non-tender  Extremities:  Warm, well-perfused    Intake/Output from previous day: 05/22 0701 - 05/23 0700 In: 941.5 [P.O.:640; I.V.:279.1; IV Piggyback:22.5] Out: 710 [Urine:710] Intake/Output this shift: Total I/O In: 46.5 [I.V.:2.3; IV Piggyback:44.2] Out: -   Lab Results:  CBC: Recent Labs    07/12/18 0449 07/13/18 0450  WBC 14.3* 14.6*  HGB 8.5* 8.1*  HCT 23.5* 23.5*  PLT 87* 131*    BMET:  Recent Labs    07/12/18 0449 07/13/18 0450  NA 140 136  K 4.2 3.2*  CL 110 106  CO2 24 23  GLUCOSE 121* 123*  BUN 22 27*  CREATININE 0.74 0.76  CALCIUM 8.5* 8.6*     PT/INR:   Recent Labs    07/10/18 1425  LABPROT 19.5*  INR 1.7*    CBG (last 3)  Recent Labs    07/12/18 0507 07/12/18 0732 07/12/18 1952  GLUCAP 103* 104* 97    ABG    Component Value Date/Time   PHART 7.416 07/10/2018 2042   PCO2ART 34.1 07/10/2018 2042   PO2ART 203.0 (H) 07/10/2018 2042   HCO3 22.0 07/10/2018 2042   TCO2 23 07/10/2018 2042   ACIDBASEDEF 2.0 07/10/2018 2042   O2SAT 100.0 07/10/2018 2042    CXR: n/a  Assessment/Plan: S/P Procedure(s) (LRB): CORONARY  ARTERY BYPASS GRAFTING (CABG), FREE LIMA (N/A) TRANSESOPHAGEAL ECHOCARDIOGRAM (TEE) (N/A)  Overall stable POD3 Post-op Afib, now back in NSR Breathing comfortably w/ O2 sats 100% on 2 L/min Weak, deconditioned and frail with some underlying anxiety Expected post op acute blood loss anemia, stable Expected post op atelectasis, mild Expected post op volume excess, mild, weight reportedly 2.5 kg > preop, UOP adequate Hypokalemia, induced by loop diuretics Post op thrombocytopenia, improved   Continue amiodarone  Start Coumadin if AF recurrs  Mobilize  Replace potassium  PT consult  Add Ensure/Boost for nutritional support   Leah Nails, MD 07/13/2018 9:17 AM

## 2018-07-13 NOTE — Progress Notes (Signed)
TCTS BRIEF SICU PROGRESS NOTE  3 Days Post-Op  S/P Procedure(s) (LRB): CORONARY ARTERY BYPASS GRAFTING (CABG), FREE LIMA (N/A) TRANSESOPHAGEAL ECHOCARDIOGRAM (TEE) (N/A)   Stable day although patient tearfully reports feeling "miserable" Maintaining NSR w/ stable BP Breathing comfortably  Plan: Continue current plan  Purcell Nails, MD 07/13/2018 5:14 PM

## 2018-07-14 LAB — BASIC METABOLIC PANEL
Anion gap: 7 (ref 5–15)
BUN: 21 mg/dL (ref 8–23)
CO2: 24 mmol/L (ref 22–32)
Calcium: 8.6 mg/dL — ABNORMAL LOW (ref 8.9–10.3)
Chloride: 104 mmol/L (ref 98–111)
Creatinine, Ser: 0.74 mg/dL (ref 0.44–1.00)
GFR calc Af Amer: >60 mL/min (ref >60)
GFR calc non Af Amer: >60 mL/min (ref >60)
Glucose, Bld: 114 mg/dL — ABNORMAL HIGH (ref 70–99)
Potassium: 4.1 mmol/L (ref 3.5–5.1)
Sodium: 135 mmol/L (ref 135–145)

## 2018-07-14 MED ORDER — ENOXAPARIN SODIUM 30 MG/0.3ML ~~LOC~~ SOLN
30.0000 mg | SUBCUTANEOUS | Status: DC
  Administered 2018-07-14 – 2018-07-19 (×6): 30 mg via SUBCUTANEOUS
  Filled 2018-07-14 (×5): qty 0.3

## 2018-07-14 MED ORDER — FUROSEMIDE 40 MG PO TABS
40.0000 mg | ORAL_TABLET | Freq: Every day | ORAL | Status: AC
  Administered 2018-07-14 – 2018-07-16 (×3): 40 mg via ORAL
  Filled 2018-07-14 (×3): qty 1

## 2018-07-14 MED ORDER — POTASSIUM CHLORIDE CRYS ER 20 MEQ PO TBCR
20.0000 meq | EXTENDED_RELEASE_TABLET | Freq: Every day | ORAL | Status: DC
  Administered 2018-07-14 – 2018-07-15 (×2): 20 meq via ORAL
  Filled 2018-07-14 (×2): qty 1

## 2018-07-14 NOTE — Plan of Care (Signed)
  Problem: Education: Goal: Understanding of CV disease, CV risk reduction, and recovery process will improve Outcome: Progressing Goal: Individualized Educational Video(s) Outcome: Progressing   Problem: Activity: Goal: Ability to return to baseline activity level will improve Outcome: Progressing   Problem: Cardiovascular: Goal: Ability to achieve and maintain adequate cardiovascular perfusion will improve Outcome: Progressing Goal: Vascular access site(s) Level 0-1 will be maintained Outcome: Progressing   Problem: Health Behavior/Discharge Planning: Goal: Ability to safely manage health-related needs after discharge will improve Outcome: Progressing   Problem: Education: Goal: Knowledge of General Education information will improve Description: Including pain rating scale, medication(s)/side effects and non-pharmacologic comfort measures Outcome: Progressing   Problem: Health Behavior/Discharge Planning: Goal: Ability to manage health-related needs will improve Outcome: Progressing   Problem: Clinical Measurements: Goal: Ability to maintain clinical measurements within normal limits will improve Outcome: Progressing Goal: Will remain free from infection Outcome: Progressing Goal: Diagnostic test results will improve Outcome: Progressing Goal: Respiratory complications will improve Outcome: Progressing Goal: Cardiovascular complication will be avoided Outcome: Progressing   Problem: Activity: Goal: Risk for activity intolerance will decrease Outcome: Progressing   Problem: Nutrition: Goal: Adequate nutrition will be maintained Outcome: Progressing   Problem: Coping: Goal: Level of anxiety will decrease Outcome: Progressing   Problem: Elimination: Goal: Will not experience complications related to bowel motility Outcome: Progressing Goal: Will not experience complications related to urinary retention Outcome: Progressing   Problem: Pain Managment: Goal:  General experience of comfort will improve Outcome: Progressing   Problem: Safety: Goal: Ability to remain free from injury will improve Outcome: Progressing   Problem: Skin Integrity: Goal: Risk for impaired skin integrity will decrease Outcome: Progressing   Problem: Education: Goal: Will demonstrate proper wound care and an understanding of methods to prevent future damage Outcome: Progressing Goal: Knowledge of disease or condition will improve Outcome: Progressing Goal: Knowledge of the prescribed therapeutic regimen will improve Outcome: Progressing Goal: Individualized Educational Video(s) Outcome: Progressing   Problem: Activity: Goal: Risk for activity intolerance will decrease Outcome: Progressing   Problem: Cardiac: Goal: Will achieve and/or maintain hemodynamic stability Outcome: Progressing   Problem: Clinical Measurements: Goal: Postoperative complications will be avoided or minimized Outcome: Progressing   Problem: Respiratory: Goal: Respiratory status will improve Outcome: Progressing   Problem: Skin Integrity: Goal: Wound healing without signs and symptoms of infection Outcome: Progressing Goal: Risk for impaired skin integrity will decrease Outcome: Progressing   Problem: Urinary Elimination: Goal: Ability to achieve and maintain adequate renal perfusion and functioning will improve Outcome: Progressing   

## 2018-07-14 NOTE — Progress Notes (Signed)
To Medical team: Mr. Mathew would like for all calls and updates to go to his daughter, Tristan Schroeder.  Mr. Belan is currently undergoing chemo and is hard of hearing.  Mr. Echeverry has requested that Mrs. Wiechmann be sent to [facility] in [place], [place] upon discharge from the hospital.  Thanks, Tristan Schroeder, Daughter [phone].

## 2018-07-14 NOTE — Evaluation (Signed)
Physical Therapy Evaluation Patient Details Name: Leah Pittman MRN: [id number] DOB: [birth year] Today's Date: 07/14/2018   History of Present Illness  85 yo admitted 5/18 for cardiac cath, CABG 5/20 with post op Afib. PMhx: anxiety, HTN, HLD, glaucoma, hypothyroidism  Clinical Impression  Pt supine on arrival after lines D/C'd. Pt with flat affect and benefits from encouragement to mobilize. Pt with decreased strength, transfers, gait and mobility who will benefit from acute therapy to maximize function, gait and independence adhering to precautions. Pt educated for all precautions with demonstration and continues to need further education to maintain. Pt concerned about spouse caring for her at home but pt demonstrates ability to achieve supervision level for return home to decrease burden of care on spouse. Pt also stating spouse plans to install ramp and pt reassured that she will be able to perform 2 entry stairs for D/C. Encouraged daily ambulation and progression.  HR 84-92 with activity Pre BP 129/61, post 124/102 SPO2 92-95% on RA    Follow Up Recommendations Home health PT;Supervision/Assistance - 24 hour    Equipment Recommendations  None recommended by PT    Recommendations for Other Services OT consult     Precautions / Restrictions Precautions Precautions: Fall;Sternal Precaution Booklet Issued: No Restrictions Weight Bearing Restrictions: No      Mobility  Bed Mobility Overal bed mobility: Needs Assistance Bed Mobility: Rolling;Sidelying to Sit Rolling: Min assist Sidelying to sit: Min assist;HOB elevated       General bed mobility comments: HOb 15 degrees with cues for sequence, use of pad and increased time to roll to side and elevate trunk  Transfers Overall transfer level: Needs assistance   Transfers: Sit to/from Stand;Stand Pivot Transfers Sit to Stand: Min guard;Min assist Stand pivot transfers: Min assist       General transfer comment:  initial stand from bed min assist additional stand from [facility] and bed all minguard with cues for hands on thighs  Ambulation/Gait Ambulation/Gait assistance: Min guard Gait Distance (Feet): 300 Feet Assistive device: Rolling walker (2 wheeled) Gait Pattern/deviations: Step-through pattern;Decreased stride length   Gait velocity interpretation: 1.31 - 2.62 ft/sec, indicative of limited community ambulator General Gait Details: rounded shoulders with cues from scapular retraction, looking up and position in RW. Pt with slow steady gait and benefits from encouragement to maximize distance  Stairs            Wheelchair Mobility    Modified Rankin (Stroke Patients Only)       Balance Overall balance assessment: Mild deficits observed, not formally tested                                           Pertinent Vitals/Pain Pain Assessment: 0-10 Pain Score: 4  Pain Location: left wrist and chest Pain Descriptors / Indicators: Aching;Discomfort;Sore Pain Intervention(s): Limited activity within patient's tolerance;Monitored during session;Repositioned    Home Living Family/patient expects to be discharged to:: Private residence Living Arrangements: Spouse/significant other Available Help at Discharge: Family;Available 24 hours/day Type of Home: House Home Access: Stairs to enter   Enter[employer] CorporationEntrance Stairs-Number of Steps: 3 Home Layout: One level Home Equipment: Walker - 2 wheels Additional Comments: spouse is undergoing chemo, recently down sized house, has kids in the area but they work    Prior Function Level of Independence: Independent  Hand Dominance        Extremity/Trunk Assessment   Upper Extremity Assessment Upper Extremity Assessment: Generalized weakness    Lower Extremity Assessment Lower Extremity Assessment: Generalized weakness    Cervical / Trunk Assessment Cervical / Trunk Assessment: Other exceptions Cervical / Trunk  Exceptions: pt maintains slight neck flexion due to tape pulling from central line removal  Communication   Communication: No difficulties  Cognition Arousal/Alertness: Awake/alert Behavior During Therapy: Flat affect Overall Cognitive Status: Within Functional Limits for tasks assessed                                        General Comments      Exercises     Assessment/Plan    PT Assessment Patient needs continued PT services  PT Problem List Decreased strength;Decreased balance;Decreased cognition;Decreased knowledge of precautions;Decreased mobility;Decreased knowledge of use of DME;Decreased activity tolerance;Pain       PT Treatment Interventions Gait training;Therapeutic activities;Stair training;Therapeutic exercise;DME instruction;Functional mobility training;Balance training;Patient/family education    PT Goals (Current goals can be found in the Care Plan section)  Acute Rehab PT Goals Patient Stated Goal: return to walking and going out to eat PT Goal Formulation: With patient Time For Goal Achievement: 07/28/18 Potential to Achieve Goals: Good    Frequency Min 3X/week   Barriers to discharge        Co-evaluation               AM-PAC PT "6 Clicks" Mobility  Outcome Measure Help needed turning from your back to your side while in a flat bed without using bedrails?: A Little Help needed moving from lying on your back to sitting on the side of a flat bed without using bedrails?: A Little Help needed moving to and from a bed to a chair (including a wheelchair)?: A Little Help needed standing up from a chair using your arms (e.g., wheelchair or bedside chair)?: A Little Help needed to walk in hospital room?: A Little Help needed climbing 3-5 steps with a railing? : A Lot 6 Click Score: 17    End of Session Equipment Utilized During Treatment: Gait belt Activity Tolerance: Patient tolerated treatment well Patient left: in chair;with call  bell/phone within reach Nurse Communication: Mobility status;Precautions PT Visit Diagnosis: Other abnormalities of gait and mobility (R26.89);Muscle weakness (generalized) (M62.81);Difficulty in walking, not elsewhere classified (R26.2)    Time: 1610-9604 PT Time Calculation (min) (ACUTE ONLY): 27 min   Charges:   PT Evaluation $PT Eval Moderate Complexity: 1 Mod PT Treatments $Gait Training: 8-22 mins        Advait Buice Abner Greenspan, PT Acute Rehabilitation Services Pager: [phone] Office: [phone]   Kazuko Clemence B Karyn Brull 07/14/2018, 1:15 PM

## 2018-07-14 NOTE — Progress Notes (Addendum)
      [address]       [place] Pittman [address]             [phone]        CARDIOTHORACIC SURGERY PROGRESS NOTE   R4 Days Post-Op Procedure(s) (LRB): CORONARY ARTERY BYPASS GRAFTING (CABG), FREE LIMA (N/A) TRANSESOPHAGEAL ECHOCARDIOGRAM (TEE) (N/A)  Subjective: Lots of complaints.  Sore chest.  Sore buttocks.  Chronic back pain.  Appetite remains poor.  No nausea at present.  No SOB  Objective: Vital signs: BP Readings from Last 1 Encounters:  07/14/18 (!) 146/62   Pulse Readings from Last 1 Encounters:  07/14/18 83   Resp Readings from Last 1 Encounters:  07/14/18 (!) 22   Temp Readings from Last 1 Encounters:  07/13/18 98.3 F (36.8 C) (Oral)    Hemodynamics:    Physical Exam:  Rhythm:   sinus  Breath sounds: clear  Heart sounds:  RRR  Incisions:  Clean and dry  Abdomen:  Soft, non-distended, non-tender  Extremities:  Warm, well-perfused    Intake/Output from previous day: 05/23 0701 - 05/24 0700 In: 1407.5 [P.O.:720; I.V.:366.6; IV Piggyback:320.9] Out: 200 [Urine:200] Intake/Output this shift: Total I/O In: 20 [I.V.:20] Out: -   Lab Results:  CBC: Recent Labs    07/12/18 0449 07/13/18 0450  WBC 14.3* 14.6*  HGB 8.5* 8.1*  HCT 23.5* 23.5*  PLT 87* 131*    BMET:  Recent Labs    07/13/18 0450 07/14/18 0210  NA 136 135  K 3.2* 4.1  CL 106 104  CO2 23 24  GLUCOSE 123* 114*  BUN 27* 21  CREATININE 0.76 0.74  CALCIUM 8.6* 8.6*     PT/INR:  No results for input(s): LABPROT, INR in the last 72 hours.  CBG (last 3)  Recent Labs    07/12/18 0507 07/12/18 0732 07/12/18 1952  GLUCAP 103* 104* 97    ABG    Component Value Date/Time   PHART 7.416 07/10/2018 2042   PCO2ART 34.1 07/10/2018 2042   PO2ART 203.0 (H) 07/10/2018 2042   HCO3 22.0 07/10/2018 2042   TCO2 23 07/10/2018 2042   ACIDBASEDEF 2.0 07/10/2018 2042   O2SAT 100.0 07/10/2018 2042    CXR: n/a  Assessment/Plan: S/P Procedure(s) (LRB):  CORONARY ARTERY BYPASS GRAFTING (CABG), FREE LIMA (N/A) TRANSESOPHAGEAL ECHOCARDIOGRAM (TEE) (N/A)  Overall stable POD4 Maintaining NSR Breathing comfortably w/ O2 sats 100% on room air Weak, deconditioned and frail with some underlying anxiety Expected post op acute blood loss anemia, stable Expected post op atelectasis, mild Expected post op volume excess, mild, weight not recorded   Continue amiodarone  Start Coumadin if AF recurrs  Lovenox for DVT prophylaxis  Mobilize  Gentle diuresis  PT consult  Encourage Ensure/Boost for nutritional support  Advance diet as tolerated  Transfer step down  Purcell Nails, MD 07/14/2018 9:10 AM

## 2018-07-15 ENCOUNTER — Inpatient Hospital Stay (HOSPITAL_COMMUNITY): Payer: Medicare Other

## 2018-07-15 LAB — COMPREHENSIVE METABOLIC PANEL
ALT: 54 U/L — ABNORMAL HIGH (ref 0–44)
AST: 27 U/L (ref 15–41)
Albumin: 2.7 g/dL — ABNORMAL LOW (ref 3.5–5.0)
Alkaline Phosphatase: 48 U/L (ref 38–126)
Anion gap: 7 (ref 5–15)
BUN: 14 mg/dL (ref 8–23)
CO2: 26 mmol/L (ref 22–32)
Calcium: 8.5 mg/dL — ABNORMAL LOW (ref 8.9–10.3)
Chloride: 104 mmol/L (ref 98–111)
Creatinine, Ser: 0.71 mg/dL (ref 0.44–1.00)
GFR calc Af Amer: >60 mL/min (ref >60)
GFR calc non Af Amer: >60 mL/min (ref >60)
Glucose, Bld: 105 mg/dL — ABNORMAL HIGH (ref 70–99)
Potassium: 3.3 mmol/L — ABNORMAL LOW (ref 3.5–5.1)
Sodium: 137 mmol/L (ref 135–145)
Total Bilirubin: 0.9 mg/dL (ref 0.3–1.2)
Total Protein: 4.8 g/dL — ABNORMAL LOW (ref 6.5–8.1)

## 2018-07-15 LAB — CBC
HCT: 21.7 % — ABNORMAL LOW (ref 36.0–46.0)
Hemoglobin: 7.5 g/dL — ABNORMAL LOW (ref 12.0–15.0)
MCH: 30.9 pg (ref 26.0–34.0)
MCHC: 34.6 g/dL (ref 30.0–36.0)
MCV: 89.3 fL (ref 80.0–100.0)
Platelets: 202 10*3/uL (ref 150–400)
RBC: 2.43 MIL/uL — ABNORMAL LOW (ref 3.87–5.11)
RDW: 15.5 % (ref 11.5–15.5)
WBC: 11.1 10*3/uL — ABNORMAL HIGH (ref 4.0–10.5)
nRBC: 0.2 % (ref 0.0–0.2)

## 2018-07-15 LAB — PREALBUMIN: Prealbumin: 14.3 mg/dL — ABNORMAL LOW (ref 18–38)

## 2018-07-15 MED ORDER — POTASSIUM CHLORIDE CRYS ER 20 MEQ PO TBCR
40.0000 meq | EXTENDED_RELEASE_TABLET | Freq: Two times a day (BID) | ORAL | Status: AC
  Administered 2018-07-15 – 2018-07-16 (×2): 40 meq via ORAL
  Filled 2018-07-15 (×2): qty 2

## 2018-07-15 MED ORDER — CLOTRIMAZOLE 10 MG MT TROC
10.0000 mg | Freq: Every day | OROMUCOSAL | Status: DC
  Administered 2018-07-15 – 2018-07-18 (×18): 10 mg via ORAL
  Filled 2018-07-15 (×25): qty 1

## 2018-07-15 NOTE — Progress Notes (Addendum)
[address]       [place] [address]             [phone]      5 Days Post-Op Procedure(s) (LRB): CORONARY ARTERY BYPASS GRAFTING (CABG), FREE LIMA (N/A) TRANSESOPHAGEAL ECHOCARDIOGRAM (TEE) (N/A) Subjective: Multiple c/o including oral thrush. Mostly just can't get comfortable  Objective: Vital signs in last 24 hours: Temp:  [98 F (36.7 C)-98.4 F (36.9 C)] 98 F (36.7 C) (05/25 0839) Pulse Rate:  [74-83] 80 (05/25 0839) Cardiac Rhythm: Normal sinus rhythm (05/25 0700) Resp:  [15-21] 21 (05/25 0839) BP: (111-171)/(45-72) 136/60 (05/25 0839) SpO2:  [96 %-100 %] 100 % (05/25 0839) Weight:  [55.1 kg] 55.1 kg (05/25 0500)  Hemodynamic parameters for last 24 hours:    Intake/Output from previous day: 05/24 0701 - 05/25 0700 In: 289.6 [P.O.:240; I.V.:49.6] Out: 450 [Urine:450] Intake/Output this shift: No intake/output data recorded.  General appearance: alert, cooperative and no distress Heart: regular rate and rhythm Lungs: mildly dim in lower fields Abdomen: benign Extremities: no edema Wound: incis healing well, some echymosis Mouth - oral thrush  Lab Results: Recent Labs    07/13/18 0450 07/15/18 0414  WBC 14.6* 11.1*  HGB 8.1* 7.5*  HCT 23.5* 21.7*  PLT 131* 202   BMET:  Recent Labs    07/14/18 0210 07/15/18 0414  NA 135 137  K 4.1 3.3*  CL 104 104  CO2 24 26  GLUCOSE 114* 105*  BUN 21 14  CREATININE 0.74 0.71  CALCIUM 8.6* 8.5*    PT/INR: No results for input(s): LABPROT, INR in the last 72 hours. ABG    Component Value Date/Time   PHART 7.416 07/10/2018 2042   HCO3 22.0 07/10/2018 2042   TCO2 23 07/10/2018 2042   ACIDBASEDEF 2.0 07/10/2018 2042   O2SAT 100.0 07/10/2018 2042   CBG (last 3)  Recent Labs    07/12/18 1952  GLUCAP 97    Meds Scheduled Meds: . acetaminophen  1,000 mg Oral Q6H  . amiodarone  200 mg Oral BID  . aspirin EC  81 mg Oral Daily  . bisacodyl  10 mg Oral Daily   Or  .  bisacodyl  10 mg Rectal Daily  . Chlorhexidine Gluconate Cloth  6 each Topical Daily  . docusate sodium  200 mg Oral Daily  . dorzolamide-timolol  1 drop Both Eyes BID  . enoxaparin (LOVENOX) injection  30 mg Subcutaneous Q24H  . feeding supplement  1 Container Oral TID BM  . feeding supplement (ENSURE ENLIVE)  237 mL Oral TID WC  . ferrous fumarate-b12-vitamic C-folic acid  1 capsule Oral BID PC  . furosemide  40 mg Oral Daily  . latanoprost  1 drop Both Eyes QHS  . levothyroxine  88 mcg Oral QAC breakfast  . mouth rinse  15 mL Mouth Rinse BID  . metoprolol tartrate  12.5 mg Oral BID  . pantoprazole  40 mg Oral Daily  . potassium chloride  20 mEq Oral Daily  . simvastatin  20 mg Oral Q supper  . sodium chloride flush  3 mL Intravenous Q12H   Continuous Infusions: . sodium chloride 250 mL (07/11/18 0608)   PRN Meds:.LORazepam, metoprolol tartrate, ondansetron (ZOFRAN) IV, sodium chloride flush  Xrays Dg Chest 2 View  Result Date: 07/15/2018 CLINICAL DATA:  85 year old female with history of atelectasis. EXAM: CHEST - 2 VIEW COMPARISON:  Chest x-ray 07/12/2018. FINDINGS: Previously noted right IJ Cordis has been removed.  Lung volumes are normal. Persistent but improving postoperative atelectasis in the left lung base. Small bilateral pleural effusions (left greater than right), slightly improved. No pneumothorax. No evidence of pulmonary edema. Heart size is upper limits of normal. Upper mediastinal contours are within normal limits. Aortic atherosclerosis. Status post median sternotomy for CABG. Epicardial pacing wires are again noted. IMPRESSION: 1. Support apparatus and postoperative changes, as above. 2. Decreasing small bilateral pleural effusions. 3. Resolving postoperative atelectasis in the left lung base. Electronically Signed   By: Trudie Reedaniel  Entrikin M.D.   On: 07/15/2018 06:27    Assessment/Plan: S/P Procedure(s) (LRB): CORONARY ARTERY BYPASS GRAFTING (CABG), FREE LIMA (N/A)  TRANSESOPHAGEAL ECHOCARDIOGRAM (TEE) (N/A)  1 conts to make steady progress 2 will order mycelex troches for oral thrush 3 sBP quite variable, will order low dose lisinopril with normal renal fxn 4 sinus rhythm 5 sats good on RA 6 Replace K+ 7 ABL anemia is approaching transfusion threshold Hgb 7.5- cont Iron for now. Thrombocytopenia is resolved 8 BS well controlled 9 CXR shows improving ATX/effusions- cont gentle diuresis, WT 3 kg above preop   LOS: 7 days    Rowe ClackWayne E Gold [facility] 07/15/2018 Pager [phone]   I have seen and examined the patient and agree with the assessment and plan as outlined.  Doing much better today.  Still very weak but overall improving.  Purcell Nailslarence H , MD 07/15/2018 10:03 AM

## 2018-07-15 NOTE — Evaluation (Signed)
Occupational Therapy Evaluation Patient Details Name: Leah Pittman MRN: [id number] DOB: May 20, 1930 Today's Date: 07/15/2018    History of Present Illness 85 yo admitted 5/18 for cardiac cath, CABG 5/20 with post op Afib. PMhx: anxiety, HTN, HLD, glaucoma, hypothyroidism   Clinical Impression   This [age] yo female admitted and underwent above presents to acute OT with sternal precautions, generalized weakness, decreased mobility all affecting her PLOF of being totally independent with basic and IADLs. He will benefit from acute OT with follow up at SNF.    Follow Up Recommendations  SNF;Supervision/Assistance - 24 hour    Equipment Recommendations  Other (comment)(TBD at next venue)       Precautions / Restrictions Precautions Precautions: Fall;Sternal Precaution Booklet Issued: No Restrictions Weight Bearing Restrictions: No      Mobility Bed Mobility               General bed mobility comments: Pt up in recliner upon arrival  Transfers Overall transfer level: Needs assistance Equipment used: Rolling walker (2 wheeled) Transfers: Sit to/from Stand Sit to Stand: Min assist         General transfer comment: VCs for holding pillow and scooting foward before sit>stand    Balance Overall balance assessment: Mild deficits observed, not formally tested                                         ADL either performed or assessed with clinical judgement   ADL Overall ADL's : Needs assistance/impaired Eating/Feeding: Independent;Sitting   Grooming: Set up;Sitting;Oral care;Brushing hair   Upper Body Bathing: Minimal assistance;Sitting   Lower Body Bathing: Moderate assistance Lower Body Bathing Details (indicate cue type and reason): min A sit>stand Upper Body Dressing : Minimal assistance;Sitting   Lower Body Dressing: Moderate assistance Lower Body Dressing Details (indicate cue type and reason): min A sit>stand Toilet Transfer: Minimal  assistance;Ambulation;RW   Toileting- Clothing Manipulation and Hygiene: Minimal assistance;Sit to/from stand         General ADL Comments: Pt ambulated 150 feet with minguard A with RW     Vision Baseline Vision/History: Glaucoma              Pertinent Vitals/Pain Pain Assessment: No/denies pain     Hand Dominance Right   Extremity/Trunk Assessment Upper Extremity Assessment Upper Extremity Assessment: Generalized weakness           Communication Communication Communication: No difficulties   Cognition Arousal/Alertness: Awake/alert Behavior During Therapy: WFL for tasks assessed/performed Overall Cognitive Status: Within Functional Limits for tasks assessed                                                Home Living Family/patient expects to be discharged to:: Skilled nursing facility Living Arrangements: Spouse/significant other Available Help at Discharge: Family;Available 24 hours/day Type of Home: House Home Access: Stairs to enter Enter[employer] CorporationEntrance Stairs-Number of Steps: 3   Home Layout: One level     Bathroom Shower/Tub: Chief Strategy OfficerTub/shower unit   Bathroom Toilet: Handicapped height     Home Equipment: Environmental consultantWalker - 2 wheels   Additional Comments: spouse is undergoing chemo, recently down sized house, has kids in the area but they work      Prior Functioning/Environment Level of Independence: Independent  OT Problem List: Decreased strength;Impaired balance (sitting and/or standing)      OT Treatment/Interventions: Self-care/ADL training;Balance training;DME and/or AE instruction;Patient/family education    OT Goals(Current goals can be found in the care plan section) Acute Rehab OT Goals Patient Stated Goal: to go to rehab and then home OT Goal Formulation: With patient Time For Goal Achievement: 07/29/18 Potential to Achieve Goals: Good  OT Frequency: Min 2X/week              AM-PAC OT "6 Clicks" Daily  Activity     Outcome Measure Help from another person eating meals?: None Help from another person taking care of personal grooming?: A Little Help from another person toileting, which includes using toliet, bedpan, or urinal?: A Little Help from another person bathing (including washing, rinsing, drying)?: A Lot Help from another person to put on and taking off regular upper body clothing?: A Little Help from another person to put on and taking off regular lower body clothing?: A Lot 6 Click Score: 17   End of Session Equipment Utilized During Treatment: Gait belt;Rolling walker  Activity Tolerance: Patient tolerated treatment well Patient left: in chair;with chair alarm set;with call bell/phone within reach  OT Visit Diagnosis: Unsteadiness on feet (R26.81);Other abnormalities of gait and mobility (R26.89);Muscle weakness (generalized) (M62.81)                Time: 0762-2633 OT Time Calculation (min): 28 min Charges:  OT General Charges $OT Visit: 1 Visit OT Evaluation $OT Eval Moderate Complexity: 1 Mod OT Treatments $Self Care/Home Management : 8-22 mins  Leah Pittman, OTR/L Acute [employer] Pager [phone] Office [phone]     Leah Pittman 07/15/2018, 2:52 PM

## 2018-07-15 NOTE — Plan of Care (Signed)
  Problem: Education: Goal: Understanding of CV disease, CV risk reduction, and recovery process will improve Outcome: Progressing Goal: Individualized Educational Video(s) Outcome: Progressing   Problem: Activity: Goal: Ability to return to baseline activity level will improve Outcome: Progressing   Problem: Cardiovascular: Goal: Ability to achieve and maintain adequate cardiovascular perfusion will improve Outcome: Progressing Goal: Vascular access site(s) Level 0-1 will be maintained Outcome: Progressing   Problem: Health Behavior/Discharge Planning: Goal: Ability to safely manage health-related needs after discharge will improve Outcome: Progressing   Problem: Education: Goal: Knowledge of General Education information will improve Description: Including pain rating scale, medication(s)/side effects and non-pharmacologic comfort measures Outcome: Progressing   Problem: Health Behavior/Discharge Planning: Goal: Ability to manage health-related needs will improve Outcome: Progressing   Problem: Clinical Measurements: Goal: Ability to maintain clinical measurements within normal limits will improve Outcome: Progressing Goal: Will remain free from infection Outcome: Progressing Goal: Diagnostic test results will improve Outcome: Progressing Goal: Respiratory complications will improve Outcome: Progressing Goal: Cardiovascular complication will be avoided Outcome: Progressing   Problem: Activity: Goal: Risk for activity intolerance will decrease Outcome: Progressing   Problem: Nutrition: Goal: Adequate nutrition will be maintained Outcome: Progressing   Problem: Coping: Goal: Level of anxiety will decrease Outcome: Progressing   Problem: Elimination: Goal: Will not experience complications related to bowel motility Outcome: Progressing Goal: Will not experience complications related to urinary retention Outcome: Progressing   Problem: Pain Managment: Goal:  General experience of comfort will improve Outcome: Progressing   Problem: Safety: Goal: Ability to remain free from injury will improve Outcome: Progressing   Problem: Skin Integrity: Goal: Risk for impaired skin integrity will decrease Outcome: Progressing   Problem: Education: Goal: Will demonstrate proper wound care and an understanding of methods to prevent future damage Outcome: Progressing Goal: Knowledge of disease or condition will improve Outcome: Progressing Goal: Knowledge of the prescribed therapeutic regimen will improve Outcome: Progressing Goal: Individualized Educational Video(s) Outcome: Progressing   Problem: Activity: Goal: Risk for activity intolerance will decrease Outcome: Progressing   Problem: Cardiac: Goal: Will achieve and/or maintain hemodynamic stability Outcome: Progressing   Problem: Clinical Measurements: Goal: Postoperative complications will be avoided or minimized Outcome: Progressing   Problem: Respiratory: Goal: Respiratory status will improve Outcome: Progressing   Problem: Skin Integrity: Goal: Wound healing without signs and symptoms of infection Outcome: Progressing Goal: Risk for impaired skin integrity will decrease Outcome: Progressing   Problem: Urinary Elimination: Goal: Ability to achieve and maintain adequate renal perfusion and functioning will improve Outcome: Progressing   

## 2018-07-15 NOTE — Care Management Important Message (Signed)
Important Message  Patient Details  Name: Leah Pittman MRN: [id number] Date of Birth: 07/19/1930   Medicare Important Message Given:  Yes    [name] 07/15/2018, 3:22 PM

## 2018-07-16 DIAGNOSIS — I4891 Unspecified atrial fibrillation: Secondary | ICD-10-CM

## 2018-07-16 DIAGNOSIS — Z951 Presence of aortocoronary bypass graft: Secondary | ICD-10-CM

## 2018-07-16 DIAGNOSIS — E7800 Pure hypercholesterolemia, unspecified: Secondary | ICD-10-CM

## 2018-07-16 DIAGNOSIS — I9789 Other postprocedural complications and disorders of the circulatory system, not elsewhere classified: Secondary | ICD-10-CM

## 2018-07-16 LAB — CBC
HCT: 23 % — ABNORMAL LOW (ref 36.0–46.0)
Hemoglobin: 7.9 g/dL — ABNORMAL LOW (ref 12.0–15.0)
MCH: 31 pg (ref 26.0–34.0)
MCHC: 34.3 g/dL (ref 30.0–36.0)
MCV: 90.2 fL (ref 80.0–100.0)
Platelets: 261 10*3/uL (ref 150–400)
RBC: 2.55 MIL/uL — ABNORMAL LOW (ref 3.87–5.11)
RDW: 16 % — ABNORMAL HIGH (ref 11.5–15.5)
WBC: 12.7 10*3/uL — ABNORMAL HIGH (ref 4.0–10.5)
nRBC: 0.3 % — ABNORMAL HIGH (ref 0.0–0.2)

## 2018-07-16 LAB — BASIC METABOLIC PANEL
Anion gap: 8 (ref 5–15)
BUN: 11 mg/dL (ref 8–23)
CO2: 25 mmol/L (ref 22–32)
Calcium: 8.7 mg/dL — ABNORMAL LOW (ref 8.9–10.3)
Chloride: 104 mmol/L (ref 98–111)
Creatinine, Ser: 0.66 mg/dL (ref 0.44–1.00)
GFR calc Af Amer: >60 mL/min (ref >60)
GFR calc non Af Amer: >60 mL/min (ref >60)
Glucose, Bld: 108 mg/dL — ABNORMAL HIGH (ref 70–99)
Potassium: 3.7 mmol/L (ref 3.5–5.1)
Sodium: 137 mmol/L (ref 135–145)

## 2018-07-16 LAB — ECHO INTRAOPERATIVE TEE
Height: 64 in
Weight: 1867.2 oz

## 2018-07-16 MED ORDER — POLYETHYLENE GLYCOL 3350 17 G PO PACK
17.0000 g | PACK | Freq: Every day | ORAL | Status: DC | PRN
  Administered 2018-07-18 – 2018-07-19 (×2): 17 g via ORAL
  Filled 2018-07-16 (×2): qty 1

## 2018-07-16 MED ORDER — LISINOPRIL 2.5 MG PO TABS
2.5000 mg | ORAL_TABLET | Freq: Every day | ORAL | Status: DC
  Administered 2018-07-16: 2.5 mg via ORAL
  Filled 2018-07-16: qty 1

## 2018-07-16 MED ORDER — ACETAMINOPHEN 160 MG/5ML PO SOLN
500.0000 mg | Freq: Four times a day (QID) | ORAL | Status: DC | PRN
  Administered 2018-07-17 – 2018-07-19 (×2): 500 mg via ORAL
  Filled 2018-07-16 (×2): qty 20.3

## 2018-07-16 MED ORDER — ROSUVASTATIN CALCIUM 5 MG PO TABS
10.0000 mg | ORAL_TABLET | Freq: Every day | ORAL | Status: DC
  Administered 2018-07-16 – 2018-07-18 (×3): 10 mg via ORAL
  Filled 2018-07-16 (×3): qty 2

## 2018-07-16 MED FILL — Heparin Sodium (Porcine) Inj 1000 Unit/ML: INTRAMUSCULAR | Qty: 10 | Status: AC

## 2018-07-16 MED FILL — Sodium Bicarbonate IV Soln 8.4%: INTRAVENOUS | Qty: 50 | Status: AC

## 2018-07-16 MED FILL — Albumin, Human Inj 5%: INTRAVENOUS | Qty: 250 | Status: AC

## 2018-07-16 MED FILL — Lidocaine HCl(Cardiac) IV PF Soln Pref Syr 100 MG/5ML (2%): INTRAVENOUS | Qty: 5 | Status: AC

## 2018-07-16 MED FILL — Electrolyte-R (PH 7.4) Solution: INTRAVENOUS | Qty: 3000 | Status: AC

## 2018-07-16 MED FILL — Sodium Chloride IV Soln 0.9%: INTRAVENOUS | Qty: 2000 | Status: AC

## 2018-07-16 NOTE — TOC Initial Note (Signed)
Transition of Care [facility]) - Initial/Assessment Note    Patient Details  Name: Leah Pittman MRN: [id number] Date of Birth: 04-Sep-1930  Transition of Care [facility]) CM/SW Contact:    Eduard Roux, LCSWA Phone Number: 07/16/2018, 1:37 PM  Clinical Narrative:                 CSW visited with the patient in her room at bedside. She was alert and oriented. Patient states she lives in a flat level condo with her spouse. Patient states they recently moved there and a ramp has been added to the home. Patient states her spouse is getting chemo therapy and is unable to care for her and is hard of hearing. Patient requested  CSW contact her daughter, Charlton Amor. CSW called patient' daughter and provided update.  CSW spoke with patient concerning PT recommendation of rehab at [facility] before returning home.Patient recognizes need for rehab before returning home and is agreeable to a SNF placement. Patient reported preference for [facility].   Patient and family  is realistic regarding therapy needs and expressed being hopeful for SNF placement. Patient expressed understanding of CSW role and discharge process as well as medical condition. No questions/concerns about plan or treatment at this time.   Expected Discharge Plan: Skilled Nursing Facility Barriers to Discharge: SNF Pending bed offer   Patient Goals and CMS Choice   CMS Medicare.gov Compare Post Acute Care list provided to:: Other (Comment Required)(patient has been to SNF before and wanted to go back  to [facility]) Choice offered to / list presented to : NA(Patient already knew her choice of SNF)  Expected Discharge Plan and Services Expected Discharge Plan: Skilled Nursing Facility In-house Referral: Clinical Social Work       Expected Discharge Date: 07/09/18                                    Prior Living Arrangements/Services   Lives with:: Self, Spouse Patient language and need for interpreter reviewed:: No Do you feel  safe going back to the place where you live?: No(patient want to go to rehab at [facility])      Need for Family Participation in Patient Care: Yes (Comment) Care giver support system in place?: Yes (comment)   Criminal Activity/Legal Involvement Pertinent to Current Situation/Hospitalization: No - Comment as needed  Activities of Daily Living Home Assistive Devices/Equipment: None ADL Screening (condition at time of admission) Patient's cognitive ability adequate to safely complete daily activities?: Yes Is the patient deaf or have difficulty hearing?: No Does the patient have difficulty seeing, even when wearing glasses/contacts?: Yes Does the patient have difficulty concentrating, remembering, or making decisions?: No Patient able to express need for assistance with ADLs?: Yes Does the patient have difficulty dressing or bathing?: No Independently performs ADLs?: Yes (appropriate for developmental age) Does the patient have difficulty walking or climbing stairs?: No Weakness of Legs: None Weakness of Arms/Hands: None  Permission Sought/Granted Permission sought to share information with : Facility Medical sales representative, Family Supports Permission granted to share information with : Yes, Verbal Permission Granted  Share Information with NAME: Sherrie PaRKS   Permission granted to share info w AGENCY: SNFs   Permission granted to share info w Relationship: daughter   Permission granted to share info w Contact Information: [phone]  Emotional Assessment Appearance:: Appears stated age Attitude/Demeanor/Rapport: Engaged Affect (typically observed): Accepting, Appropriate Orientation: : Oriented to Self, Oriented to  Place, Oriented to  Time, Oriented to Situation Alcohol / Substance Use: Not Applicable Psych Involvement: No (comment)  Admission diagnosis:  Chest Pain Patient Active Problem List   Diagnosis Date Noted  . S/P CABG x 4 07/10/2018  . CAD in native artery 07/08/2018   . Essential hypertension 08/14/2017  . Hypercholesterolemia 11/30/2015  . Precordial chest pain 01/13/2015  . Chest pain with high risk for cardiac etiology 12/12/2013  . Glaucoma 09/29/2012  . Anxiety   . Labile hypertension   . Migraine headache    PCP:  Blane Oharaox, [name], MD [facility]   [phone] [place] - [phone] [facility] [address] [facility] [id number] [place] [id number] Phone: [phone] Fax: [phone]     Social Determinants of Health (SDOH) Interventions    Readmission Risk Interventions No flowsheet data found.

## 2018-07-16 NOTE — Progress Notes (Signed)
Removed pt EPW per order. Pt tolerated well, ends intact. VSS.  Pt instructed to stay on bedrest x 1 hour. Will continue to monitor.  Theophilus Kinds, RN

## 2018-07-16 NOTE — [place] FL2 (Addendum)
[place] MEDICAID FL2 LEVEL OF CARE SCREENING TOOL     IDENTIFICATION  Patient Name: Leah MillinMary E Navarro Birthdate: [birth year] Sex: female Admission Date (Current Location): 07/08/2018  [facility] [place]   Facility and Address:  [facility] [place], [address], [place], [place] [address]      Provider Number: [id number]  Attending Physician Name and Address:  Alleen BorneBartle, Bryan K, MD  Relative Name and Phone Number:  Tristan Schroederarks, Sherrie    Current Level of Care: Hospital Recommended Level of Care: Skilled Nursing Facility Prior Approval Number:    Date Approved/Denied:   PASRR Number:  [id number]   Discharge Plan: SNF    Current Diagnoses: Patient Active Problem List   Diagnosis Date Noted  . S/P CABG x 4 07/10/2018  . CAD in native artery 07/08/2018  . Essential hypertension 08/14/2017  . Hypercholesterolemia 11/30/2015  . Precordial chest pain 01/13/2015  . Chest pain with high risk for cardiac etiology 12/12/2013  . Glaucoma 09/29/2012  . Anxiety   . Labile hypertension   . Migraine headache     Orientation RESPIRATION BLADDER Height & Weight     Self, Time, Situation, Place  Normal Continent Weight: 120 lb 4.8 oz (54.6 kg) Height:  5\' 4"  (162.6 cm)  BEHAVIORAL SYMPTOMS/MOOD NEUROLOGICAL BOWEL NUTRITION STATUS      Continent Diet(please see discharge summary )  AMBULATORY STATUS COMMUNICATION OF NEEDS Skin     Verbally Surgical wounds(incision(closed) leg, left; incision(closed) chest )                       Personal Care Assistance Level of Assistance  Bathing, Feeding, Dressing Bathing Assistance: Limited assistance Feeding assistance: Independent Dressing Assistance: Limited assistance     Functional Limitations Info  Sight, Hearing, Speech Sight Info: Adequate Hearing Info: Adequate Speech Info: Adequate    SPECIAL CARE FACTORS FREQUENCY  PT (By licensed PT), OT (By licensed OT)     PT Frequency: 5x per  week  OT Frequency: 5sx per week             Contractures Contractures Info: Present    Additional Factors Info  Code Status, Allergies Code Status Info: FULL Allergies Info: Benadryl diphenhydramine, Codeine, Meperidine, Prednisone, Promethazine,Propranolol,  Shellfish Allergy, Tape, Tazarotene, Iodinated Diagnostic Agents           Current Medications (07/16/2018):  This is the current hospital active medication list Current Facility-Administered Medications  Medication Dose Route Frequency Provider Last Rate Last Dose  . 0.9 %  sodium chloride infusion  250 mL Intravenous Continuous Purcell Nailswen, Clarence H, MD 1 mL/hr at 07/11/18 0608 250 mL at 07/11/18 0608  . acetaminophen (TYLENOL) solution 500 mg  500 mg Oral Q6H PRN Alleen BorneBartle, Bryan K, MD      . amiodarone (PACERONE) tablet 200 mg  200 mg Oral BID Purcell Nailswen, Clarence H, MD   200 mg at 07/16/18 1146  . aspirin EC tablet 81 mg  81 mg Oral Daily Purcell Nailswen, Clarence H, MD   81 mg at 07/16/18 1147  . Chlorhexidine Gluconate Cloth 2 % PADS 6 each  6 each Topical Daily Purcell Nailswen, Clarence H, MD   6 each at 07/15/18 [phone]  . clotrimazole (MYCELEX) troche 10 mg  10 mg Oral 5 X Daily Gold, Wayne E, PA-C   10 mg at 07/16/18 1147  . docusate sodium (COLACE) capsule 200 mg  200 mg Oral Daily Purcell Nailswen, Clarence H, MD   200  mg at 07/16/18 1147  . dorzolamide-timolol (COSOPT) 22.3-6.8 MG/ML ophthalmic solution 1 drop  1 drop Both Eyes BID Purcell Nails, MD   1 drop at 07/16/18 1148  . enoxaparin (LOVENOX) injection 30 mg  30 mg Subcutaneous Q24H Purcell Nails, MD   30 mg at 07/16/18 1147  . feeding supplement (BOOST / RESOURCE BREEZE) liquid 1 Container  1 Container Oral TID BM Purcell Nails, MD   1 Container at 07/15/18 2203  . feeding supplement (ENSURE ENLIVE) (ENSURE ENLIVE) liquid 237 mL  237 mL Oral TID WC Purcell Nails, MD   237 mL at 07/15/18 0904  . ferrous fumarate-b12-vitamic C-folic acid (TRINSICON / FOLTRIN) capsule 1 capsule  1 capsule Oral BID  PC Purcell Nails, MD   1 capsule at 07/16/18 1146  . latanoprost (XALATAN) 0.005 % ophthalmic solution 1 drop  1 drop Both Eyes QHS Purcell Nails, MD   1 drop at 07/15/18 2139  . levothyroxine (SYNTHROID) tablet 88 mcg  88 mcg Oral QAC breakfast Purcell Nails, MD   88 mcg at 07/16/18 0547  . lisinopril (ZESTRIL) tablet 2.5 mg  2.5 mg Oral Daily Gold, Wayne E, PA-C   2.5 mg at 07/16/18 1146  . LORazepam (ATIVAN) tablet 0.5 mg  0.5 mg Oral QHS PRN Purcell Nails, MD   0.5 mg at 07/16/18 0121  . MEDLINE mouth rinse  15 mL Mouth Rinse BID Purcell Nails, MD   15 mL at 07/15/18 0906  . metoprolol tartrate (LOPRESSOR) injection 2.5-5 mg  2.5-5 mg Intravenous Q2H PRN Purcell Nails, MD   5 mg at 07/12/18 1118  . metoprolol tartrate (LOPRESSOR) tablet 12.5 mg  12.5 mg Oral BID Purcell Nails, MD   12.5 mg at 07/16/18 1146  . ondansetron (ZOFRAN) injection 4 mg  4 mg Intravenous Q6H PRN Purcell Nails, MD   4 mg at 07/15/18 0203  . pantoprazole (PROTONIX) EC tablet 40 mg  40 mg Oral Daily Purcell Nails, MD   40 mg at 07/16/18 1147  . polyethylene glycol (MIRALAX / GLYCOLAX) packet 17 g  17 g Oral Daily PRN [name], MD      . rosuvastatin (CRESTOR) tablet 10 mg  10 mg Oral q1800 Jodelle Red, MD      . sodium chloride flush (NS) 0.9 % injection 3 mL  3 mL Intravenous Q12H Purcell Nails, MD   3 mL at 07/15/18 2204  . sodium chloride flush (NS) 0.9 % injection 3 mL  3 mL Intravenous PRN Purcell Nails, MD         Discharge Medications: Please see discharge summary for a list of discharge medications.  Relevant Imaging Results:  Relevant Lab Results:   Additional Information SSN [id number]  Eduard Roux, [place]

## 2018-07-16 NOTE — Progress Notes (Addendum)
[address]       [place] [id number]             [phone]      6 Days Post-Op Procedure(s) (LRB): CORONARY ARTERY BYPASS GRAFTING (CABG), FREE LIMA (N/A) TRANSESOPHAGEAL ECHOCARDIOGRAM (TEE) (N/A) Subjective: Mouth feels a little better, conts to be quite weak but slowly improving  Objective: Vital signs in last 24 hours: Temp:  [97.7 F (36.5 C)-98.3 F (36.8 C)] 97.9 F (36.6 C) (05/26 0526) Pulse Rate:  [73-93] 83 (05/26 0526) Cardiac Rhythm: Normal sinus rhythm (05/26 0526) Resp:  [18-22] 20 (05/26 0526) BP: (90-158)/(53-84) 120/55 (05/26 0526) SpO2:  [95 %-100 %] 95 % (05/26 0526) Weight:  [54.6 kg] 54.6 kg (05/26 0526)  Hemodynamic parameters for last 24 hours:    Intake/Output from previous day: 05/25 0701 - 05/26 0700 In: 820 [P.O.:820] Out: 500 [Urine:500] Intake/Output this shift: No intake/output data recorded.  General appearance: alert, cooperative and no distress Heart: regular rate and rhythm Lungs: dim in bases Abdomen: minor tenderness, diffuse Extremities: trace edema Wound: incis healing well  Lab Results: Recent Labs    07/15/18 0414 07/16/18 0351  WBC 11.1* 12.7*  HGB 7.5* 7.9*  HCT 21.7* 23.0*  PLT 202 261   BMET:  Recent Labs    07/15/18 0414 07/16/18 0351  NA 137 137  K 3.3* 3.7  CL 104 104  CO2 26 25  GLUCOSE 105* 108*  BUN 14 11  CREATININE 0.71 0.66  CALCIUM 8.5* 8.7*    PT/INR: No results for input(s): LABPROT, INR in the last 72 hours. ABG    Component Value Date/Time   PHART 7.416 07/10/2018 2042   HCO3 22.0 07/10/2018 2042   TCO2 23 07/10/2018 2042   ACIDBASEDEF 2.0 07/10/2018 2042   O2SAT 100.0 07/10/2018 2042   CBG (last 3)  No results for input(s): GLUCAP in the last 72 hours.  Meds Scheduled Meds: . amiodarone  200 mg Oral BID  . aspirin EC  81 mg Oral Daily  . bisacodyl  10 mg Oral Daily   Or  . bisacodyl  10 mg Rectal Daily  . Chlorhexidine Gluconate Cloth  6 each  Topical Daily  . clotrimazole  10 mg Oral 5 X Daily  . docusate sodium  200 mg Oral Daily  . dorzolamide-timolol  1 drop Both Eyes BID  . enoxaparin (LOVENOX) injection  30 mg Subcutaneous Q24H  . feeding supplement  1 Container Oral TID BM  . feeding supplement (ENSURE ENLIVE)  237 mL Oral TID WC  . ferrous fumarate-b12-vitamic C-folic acid  1 capsule Oral BID PC  . furosemide  40 mg Oral Daily  . latanoprost  1 drop Both Eyes QHS  . levothyroxine  88 mcg Oral QAC breakfast  . mouth rinse  15 mL Mouth Rinse BID  . metoprolol tartrate  12.5 mg Oral BID  . pantoprazole  40 mg Oral Daily  . potassium chloride  40 mEq Oral BID  . simvastatin  20 mg Oral Q supper  . sodium chloride flush  3 mL Intravenous Q12H   Continuous Infusions: . sodium chloride 250 mL (07/11/18 0608)   PRN Meds:.LORazepam, metoprolol tartrate, ondansetron (ZOFRAN) IV, sodium chloride flush  Xrays Dg Chest 2 View  Result Date: 07/15/2018 CLINICAL DATA:  85 year old female with history of atelectasis. EXAM: CHEST - 2 VIEW COMPARISON:  Chest x-ray 07/12/2018. FINDINGS: Previously noted right IJ Cordis has been removed. Lung volumes are normal.  Persistent but improving postoperative atelectasis in the left lung base. Small bilateral pleural effusions (left greater than right), slightly improved. No pneumothorax. No evidence of pulmonary edema. Heart size is upper limits of normal. Upper mediastinal contours are within normal limits. Aortic atherosclerosis. Status post median sternotomy for CABG. Epicardial pacing wires are again noted. IMPRESSION: 1. Support apparatus and postoperative changes, as above. 2. Decreasing small bilateral pleural effusions. 3. Resolving postoperative atelectasis in the left lung base. Electronically Signed   By: Trudie Reed M.D.   On: 07/15/2018 06:27    Assessment/Plan: S/P Procedure(s) (LRB): CORONARY ARTERY BYPASS GRAFTING (CABG), FREE LIMA (N/A) TRANSESOPHAGEAL ECHOCARDIOGRAM (TEE)  (N/A)  1 hemodyn stable, SBP quite variable 2 sinus rhythm 3 sats good on RA 4 H/H improved a little 5 renal fxn stable 6 BS well controlled 7 wants to go to short term rehab at [facility]- will have  social work see   LOS: 8 days    Rowe Clack [facility] 07/16/2018 Pager [phone]   Chart reviewed, patient examined, agree with above. Still has a lot of complaints but feeling somewhat better. Still only taking clear liquids and complains of some difficulty swallowing which she had problems with preop. May have been exacerbated by TEE and intubation. Will advance to soft diet.

## 2018-07-16 NOTE — Plan of Care (Signed)
Care Plan has been reviewed:  Problem: Health Behavior/Discharge Planning: appeared anxious, worried and had anxiety. Requested Ativan at night, woke up and coughed several times with white clear sputum. Complained about sore throat and difficulty to swallow pills. Goal: Ability to manage health-related needs will improve Outcome: Progressing: emotional supported, reassurance  and education given. Pt was able to sleep better after Ativan given. However she stated she will not be able to take care of herself and her husband after discharged. I reassured her that she will go to SNF and case manager and SW will follow up this issue with her. About her difficulty swallowing pills, recommended to crush and put in apple sauce.   Problem: Activity: Goal: Ability to return to baseline activity level will improve Outcome: Progressing: walked in hallway x 2 rounds with walker and standby assisted, well tolerated.   Problem: Cardiovascular: Goal: Ability to achieve and maintain adequate cardiovascular perfusion will improve Outcome: Progressing: Metoprolol bed time dose was held due to BP 90/50s mmHg. Pt was asymptomatic. HR 70s-80s, NSR on monitor.   Problem: Clinical Measurements: Goal: Will remain free from infection Outcome: Progressing: remained afebrile, sternal wound and right left EVH looked good, dry clean and intact, no drainage.   Problem: Clinical Measurements: Electrolyte imbalance,K 3.3 mmol/dl, Hb 7.5 Goal: Diagnostic test results will improve Outcome: Progressing: got increased dose KCL 40 mEq placement per order, will monitor BMP at am. About Hb, MD aware.   Problem: Clinical Measurements: Goal: Respiratory complications will improve Outcome: Progressing; room air Spo2 96-99%, fair effort incentive spirometer 750-1041ml x 5, needed reinforcement.   Problem: Pain Managment: Goal: General experience of comfort will improve Outcome: Progressing: no complaint about pain, refused pain  med tonight.  Filiberto Pinks, BSN, RN, PCCN-CMC, CSC

## 2018-07-16 NOTE — Progress Notes (Signed)
Physical Therapy Treatment Patient Details Name: Leah Pittman MRN: [id number] DOB: 07/09/1930 Today's Date: 07/16/2018    History of Present Illness 85 yo admitted 5/18 for cardiac cath, CABG 5/20 with post op Afib. PMhx: anxiety, HTN, HLD, glaucoma, hypothyroidism    PT Comments    Pt is agreeable to therapy today, and is making slow progress towards her goals. Pt is limited in safe mobility by sternal precautions, as well as generalized weakness. Pt requires extra time and effort to perform tasks at hand. Pt is currently minA for bed mobility, transfers and min guard for ambulation with RW. Pt has decreased support at home and would benefit from SNF to improve strength and endurance before ultimately going home. PT will continue to follow acutely.    Follow Up Recommendations  Supervision/Assistance - 24 hour;SNF     Equipment Recommendations  None recommended by PT    Recommendations for Other Services OT consult     Precautions / Restrictions Precautions Precautions: Fall;Sternal Precaution Booklet Issued: Yes (comment) Precaution Comments: extensive education on maintaining precautions Restrictions Weight Bearing Restrictions: No    Mobility  Bed Mobility Overal bed mobility: Needs Assistance Bed Mobility: Sidelying to Sit   Sidelying to sit: Min assist;HOB elevated       General bed mobility comments: pt laying on R side on entry, min A for power up to sitting  Transfers Overall transfer level: Needs assistance Equipment used: Rolling walker (2 wheeled) Transfers: Sit to/from Stand Sit to Stand: Min assist;Min guard Stand pivot transfers: Min assist       General transfer comment: min guard for power up from bed surface, vc for pillow use and scooting hips to EoB, min A required for power up from lower toilet surface  Ambulation/Gait Ambulation/Gait assistance: Min guard Gait Distance (Feet): 80 Feet Assistive device: Rolling walker (2 wheeled) Gait  Pattern/deviations: Step-through pattern;Decreased stride length Gait velocity: slowed Gait velocity interpretation: <1.8 ft/sec, indicate of risk for recurrent falls General Gait Details: min guard for safety, vc for widening Bos, proximity to RW, and for shoulder relaxation       Balance Overall balance assessment: Mild deficits observed, not formally tested                                          Cognition Arousal/Alertness: Awake/alert Behavior During Therapy: Flat affect Overall Cognitive Status: Within Functional Limits for tasks assessed                                           General Comments General comments (skin integrity, edema, etc.): VSS,       Pertinent Vitals/Pain Pain Assessment: Faces Faces Pain Scale: Hurts little more Pain Location: left wrist and chest Pain Descriptors / Indicators: Aching;Discomfort;Sore Pain Intervention(s): Limited activity within patient's tolerance;Monitored during session;Repositioned           PT Goals (current goals can now be found in the care plan section) Acute Rehab PT Goals PT Goal Formulation: With patient Time For Goal Achievement: 07/28/18 Potential to Achieve Goals: Good Progress towards PT goals: Progressing toward goals    Frequency    Min 3X/week      PT Plan Discharge plan needs to be updated       AM-PAC PT "6 Clicks"  Mobility   Outcome Measure  Help needed turning from your back to your side while in a flat bed without using bedrails?: A Little Help needed moving from lying on your back to sitting on the side of a flat bed without using bedrails?: A Little Help needed moving to and from a bed to a chair (including a wheelchair)?: A Little Help needed standing up from a chair using your arms (e.g., wheelchair or bedside chair)?: A Little Help needed to walk in hospital room?: A Little Help needed climbing 3-5 steps with a railing? : A Lot 6 Click Score: 17     End of Session Equipment Utilized During Treatment: Gait belt Activity Tolerance: Patient tolerated treatment well Patient left: in chair;with call bell/phone within reach Nurse Communication: Mobility status;Precautions PT Visit Diagnosis: Other abnormalities of gait and mobility (R26.89);Muscle weakness (generalized) (M62.81);Difficulty in walking, not elsewhere classified (R26.2)     Time: [id number] PT Time Calculation (min) (ACUTE ONLY): 27 min  Charges:  $Gait Training: 23-37 mins                     Clorissa Gruenberg B. Beverely RisenVan Pittman PT, DPT Acute Rehabilitation Services Pager [phone] Office [phone]    Leah Pittman 07/16/2018, 1:12 PM

## 2018-07-16 NOTE — Progress Notes (Signed)
Progress Note  Patient Name: Leah Pittman Date of Encounter: 07/16/2018  Primary Cardiologist: Thurmon Fair, MD   Subjective   Didn't sleep well. Has sternal wall pain, can't take tylenol pills (noted that she now has liquid ordered). Trying to use incentive spirometer. Waiting to walk with physical therapy.   Inpatient Medications    Scheduled Meds: . amiodarone  200 mg Oral BID  . aspirin EC  81 mg Oral Daily  . Chlorhexidine Gluconate Cloth  6 each Topical Daily  . clotrimazole  10 mg Oral 5 X Daily  . docusate sodium  200 mg Oral Daily  . dorzolamide-timolol  1 drop Both Eyes BID  . enoxaparin (LOVENOX) injection  30 mg Subcutaneous Q24H  . feeding supplement  1 Container Oral TID BM  . feeding supplement (ENSURE ENLIVE)  237 mL Oral TID WC  . ferrous fumarate-b12-vitamic C-folic acid  1 capsule Oral BID PC  . furosemide  40 mg Oral Daily  . latanoprost  1 drop Both Eyes QHS  . levothyroxine  88 mcg Oral QAC breakfast  . lisinopril  2.5 mg Oral Daily  . mouth rinse  15 mL Mouth Rinse BID  . metoprolol tartrate  12.5 mg Oral BID  . pantoprazole  40 mg Oral Daily  . potassium chloride  40 mEq Oral BID  . simvastatin  20 mg Oral Q supper  . sodium chloride flush  3 mL Intravenous Q12H   Continuous Infusions: . sodium chloride 250 mL (07/11/18 0608)   PRN Meds: acetaminophen (TYLENOL) oral liquid 160 mg/5 mL, LORazepam, metoprolol tartrate, ondansetron (ZOFRAN) IV, polyethylene glycol, sodium chloride flush   Vital Signs    Vitals:   07/15/18 2218 07/15/18 2333 07/16/18 0526 07/16/18 0818  BP: (!) 158/59 107/77 (!) 120/55 (!) 120/56  Pulse:  84 83 90  Resp: 19 (!) 21 20 (!) 21  Temp:  97.7 F (36.5 C) 97.9 F (36.6 C) 98 F (36.7 C)  TempSrc:  Oral Oral Oral  SpO2:  97% 95% 99%  Weight:   54.6 kg   Height:        Intake/Output Summary (Last 24 hours) at 07/16/2018 1016 Last data filed at 07/16/2018 0959 Gross per 24 hour  Intake 730 ml  Output 500  ml  Net 230 ml   Last 3 Weights 07/16/2018 07/15/2018 07/13/2018  Weight (lbs) 120 lb 4.8 oz 121 lb 7.6 oz 121 lb 11.1 oz  Weight (kg) 54.568 kg 55.1 kg 55.2 kg      Telemetry    NSR - Personally Reviewed  ECG    NSR 5/21 - Personally Reviewed  Physical Exam   GEN: No acute distress.   Neck: No JVD Cardiac: RRR, no murmurs, rubs, or gallops.  Respiratory: Clear to auscultation bilaterally except for atelectasis in the bases. GI: Soft, nontender, non-distended  MS: No edema; No deformity. Neuro:  Nonfocal  Psych: Normal affect   Labs    Chemistry Recent Labs  Lab 07/14/18 0210 07/15/18 0414 07/16/18 0351  NA 135 137 137  K 4.1 3.3* 3.7  CL 104 104 104  CO2 24 26 25   GLUCOSE 114* 105* 108*  BUN 21 14 11   CREATININE 0.74 0.71 0.66  CALCIUM 8.6* 8.5* 8.7*  PROT  --  4.8*  --   ALBUMIN  --  2.7*  --   AST  --  27  --   ALT  --  54*  --   ALKPHOS  --  48  --   BILITOT  --  0.9  --   GFRNONAA >60 >60 >60  GFRAA >60 >60 >60  ANIONGAP 7 7 8      Hematology Recent Labs  Lab 07/13/18 0450 07/15/18 0414 07/16/18 0351  WBC 14.6* 11.1* 12.7*  RBC 2.68* 2.43* 2.55*  HGB 8.1* 7.5* 7.9*  HCT 23.5* 21.7* 23.0*  MCV 87.7 89.3 90.2  MCH 30.2 30.9 31.0  MCHC 34.5 34.6 34.3  RDW 16.2* 15.5 16.0*  PLT 131* 202 261    Cardiac EnzymesNo results for input(s): TROPONINI in the last 168 hours. No results for input(s): TROPIPOC in the last 168 hours.   BNPNo results for input(s): BNP, PROBNP in the last 168 hours.   DDimer No results for input(s): DDIMER in the last 168 hours.   Radiology    Dg Chest 2 View  Result Date: 07/15/2018 CLINICAL DATA:  85 year old female with history of atelectasis. EXAM: CHEST - 2 VIEW COMPARISON:  Chest x-ray 07/12/2018. FINDINGS: Previously noted right IJ Cordis has been removed. Lung volumes are normal. Persistent but improving postoperative atelectasis in the left lung base. Small bilateral pleural effusions (left greater than right),  slightly improved. No pneumothorax. No evidence of pulmonary edema. Heart size is upper limits of normal. Upper mediastinal contours are within normal limits. Aortic atherosclerosis. Status post median sternotomy for CABG. Epicardial pacing wires are again noted. IMPRESSION: 1. Support apparatus and postoperative changes, as above. 2. Decreasing small bilateral pleural effusions. 3. Resolving postoperative atelectasis in the left lung base. Electronically Signed   By: Trudie Reedaniel  Entrikin M.D.   On: 07/15/2018 06:27    Cardiac Studies   ECHO:  07/08/2018 1. The left ventricle has normal systolic function with an ejection fraction of 60-65%. The cavity size was normal. There is mildly increased left ventricular wall thickness. Left ventricular diastolic Doppler parameters are consistent with impaired  relaxation. Indeterminate filling pressures The E/e' is 8-15. No evidence of left ventricular regional wall motion abnormalities. 2. The right ventricle has normal systolic function. The cavity was normal. There is no increase in right ventricular wall thickness. 3. The tricuspid valve is grossly normal. 4. The aortic valve is grossly normal. No stenosis of the aortic valve.  CARDIAC CATH: 07/08/2018  Prox RCA to Mid RCA lesion is 40% stenosed.  Ost LM lesion is 75% stenosed.  Mid LM lesion is 40% stenosed.  Ost Cx to Prox Cx lesion is 50% stenosed.  Prox Cx to Mid Cx lesion is 95% stenosed.  Prox LAD to Mid LAD lesion is 90% stenosed.  The left ventricular systolic function is normal.  LV end diastolic pressure is normal.  The left ventricular ejection fraction is greater than 65% by visual estimate.  Patient Profile     85 y.o. female with PMH HTN, HLD, anxiety, GERD admitted for chest pain, cath with 3V disease, now s/p CABG.  Assessment & Plan    CAD s/p CABG (LIMA-LAD, SVG-Diag, Sequential SVG to OM1-OM2, 07/10/18 Dr. Laneta SimmersBartle) -has had slow recovery given frailty/age  -encouraged incentive spirometry -stable post operative blood loss anemia -planned for short term rehab (prefers [facility]) -on aspirin 81 mg -on PO lasix 40 mg daily for post op volume removal. Weight today 54.6 kg, admission weight 52.6 kg. Suspect she will not need for much longer. -Cr 0.66 today, K 3.7 -on lisinopril 2.5 mg daily -on metoprolol tartrate 12.5 mg BID -on home simvastatin 20 mg. Given amiodarone and CAD, will change to rosuvastatin today. Will  start with 10 mg dose, can increase to 20 mg dose if lower dose tolerated (and/or not at LDL goal) as this would then be a high intensity dose. -normal EF -encouraged her to use liquid acetaminophen for pain  Post op atrial fibrillation -on amiodarone 200 mg BID (has nausea even before amiodarone). LFTs nl this admission, has known hypothyroidism on replacement levothyroxine. -in NSR currently  Hypertension: currently 120/56 -continue low dose ACEi, BB as above  Hyperlipidemia: LDL was 91 5/19 on home simvastatin. Goal LDL <70. -currently on home simvastatin 20 mg. Given amiodarone use and LDL not at goal, will change to rosuvastatin today.  For questions or updates, please contact [facility] Please consult [email or web address] for contact info under     Signed, Jodelle Red, MD  07/16/2018, 10:16 AM

## 2018-07-17 ENCOUNTER — Telehealth: Payer: Self-pay | Admitting: Physician Assistant

## 2018-07-17 LAB — CBC WITH DIFFERENTIAL/PLATELET
Abs Immature Granulocytes: 0.86 10*3/uL — ABNORMAL HIGH (ref 0.00–0.07)
Basophils Absolute: 0.1 10*3/uL (ref 0.0–0.1)
Basophils Relative: 0 %
Eosinophils Absolute: 0.8 10*3/uL — ABNORMAL HIGH (ref 0.0–0.5)
Eosinophils Relative: 6 %
HCT: 23.8 % — ABNORMAL LOW (ref 36.0–46.0)
Hemoglobin: 7.9 g/dL — ABNORMAL LOW (ref 12.0–15.0)
Immature Granulocytes: 6 %
Lymphocytes Relative: 14 %
Lymphs Abs: 1.9 10*3/uL (ref 0.7–4.0)
MCH: 30.7 pg (ref 26.0–34.0)
MCHC: 33.2 g/dL (ref 30.0–36.0)
MCV: 92.6 fL (ref 80.0–100.0)
Monocytes Absolute: 1.8 10*3/uL — ABNORMAL HIGH (ref 0.1–1.0)
Monocytes Relative: 13 %
Neutro Abs: 8.7 10*3/uL — ABNORMAL HIGH (ref 1.7–7.7)
Neutrophils Relative %: 61 %
Platelets: 339 10*3/uL (ref 150–400)
RBC: 2.57 MIL/uL — ABNORMAL LOW (ref 3.87–5.11)
RDW: 16.8 % — ABNORMAL HIGH (ref 11.5–15.5)
WBC: 14.1 10*3/uL — ABNORMAL HIGH (ref 4.0–10.5)
nRBC: 0.3 % — ABNORMAL HIGH (ref 0.0–0.2)

## 2018-07-17 LAB — SARS CORONAVIRUS 2 BY RT PCR (HOSPITAL ORDER, PERFORMED IN ~~LOC~~ HOSPITAL LAB): SARS Coronavirus 2: NEGATIVE

## 2018-07-17 MED ORDER — AMLODIPINE BESYLATE 5 MG PO TABS
5.0000 mg | ORAL_TABLET | Freq: Every day | ORAL | Status: DC
  Administered 2018-07-17 – 2018-07-19 (×3): 5 mg via ORAL
  Filled 2018-07-17 (×3): qty 1

## 2018-07-17 MED ORDER — MOVING RIGHT ALONG BOOK
Freq: Once | Status: AC
  Administered 2018-07-17: 06:00:00 1
  Filled 2018-07-17: qty 1

## 2018-07-17 MED ORDER — GUAIFENESIN ER 600 MG PO TB12
1200.0000 mg | ORAL_TABLET | Freq: Two times a day (BID) | ORAL | Status: DC
  Administered 2018-07-17 – 2018-07-19 (×5): 1200 mg via ORAL
  Filled 2018-07-17 (×5): qty 2

## 2018-07-17 NOTE — Plan of Care (Signed)
  Problem: Education: Goal: Understanding of CV disease, CV risk reduction, and recovery process will improve Outcome: Progressing Goal: Individualized Educational Video(s) Outcome: Progressing   Problem: Activity: Goal: Ability to return to baseline activity level will improve Outcome: Progressing   Problem: Cardiovascular: Goal: Ability to achieve and maintain adequate cardiovascular perfusion will improve Outcome: Progressing Goal: Vascular access site(s) Level 0-1 will be maintained Outcome: Progressing   Problem: Health Behavior/Discharge Planning: Goal: Ability to safely manage health-related needs after discharge will improve Outcome: Progressing   Problem: Activity: Goal: Risk for activity intolerance will decrease Outcome: Progressing   Problem: Coping: Goal: Level of anxiety will decrease Outcome: Progressing   Problem: Nutrition: Goal: Adequate nutrition will be maintained Outcome: Progressing   Problem: Elimination: Goal: Will not experience complications related to bowel motility Outcome: Progressing Goal: Will not experience complications related to urinary retention Outcome: Progressing

## 2018-07-17 NOTE — Progress Notes (Signed)
Progress Note  Patient Name: Leah Pittman Date of Encounter: 07/17/2018  Primary Cardiologist: Thurmon Fair, MD   Subjective   Still having issues with sleep, though maybe improving. Has a dry cough, feels like there is phlegm but nothing coming up. No fevers/chills. Walked with PT this AM, tired but overall feeling ok. Looking forward to going to rehab when there is a bed.  Inpatient Medications    Scheduled Meds: . amiodarone  200 mg Oral BID  . amLODipine  5 mg Oral Daily  . aspirin EC  81 mg Oral Daily  . Chlorhexidine Gluconate Cloth  6 each Topical Daily  . clotrimazole  10 mg Oral 5 X Daily  . docusate sodium  200 mg Oral Daily  . dorzolamide-timolol  1 drop Both Eyes BID  . enoxaparin (LOVENOX) injection  30 mg Subcutaneous Q24H  . feeding supplement  1 Container Oral TID BM  . feeding supplement (ENSURE ENLIVE)  237 mL Oral TID WC  . ferrous fumarate-b12-vitamic C-folic acid  1 capsule Oral BID PC  . guaiFENesin  1,200 mg Oral BID  . latanoprost  1 drop Both Eyes QHS  . levothyroxine  88 mcg Oral QAC breakfast  . mouth rinse  15 mL Mouth Rinse BID  . metoprolol tartrate  12.5 mg Oral BID  . pantoprazole  40 mg Oral Daily  . rosuvastatin  10 mg Oral q1800  . sodium chloride flush  3 mL Intravenous Q12H   Continuous Infusions: . sodium chloride 250 mL (07/11/18 0608)   PRN Meds: acetaminophen (TYLENOL) oral liquid 160 mg/5 mL, LORazepam, metoprolol tartrate, ondansetron (ZOFRAN) IV, polyethylene glycol, sodium chloride flush   Vital Signs    Vitals:   07/16/18 2331 07/17/18 0426 07/17/18 0434 07/17/18 1043  BP: (!) 142/61 (!) 147/66  (!) 129/49  Pulse: 76 91  88  Resp: (!) 24 (!) 21    Temp: (!) 97.4 F (36.3 C) 98.5 F (36.9 C)    TempSrc: Axillary Oral    SpO2: 100% 99%    Weight:   54.7 kg   Height:        Intake/Output Summary (Last 24 hours) at 07/17/2018 1128 Last data filed at 07/17/2018 0400 Gross per 24 hour  Intake 500 ml  Output 500  ml  Net 0 ml   Last 3 Weights 07/17/2018 07/16/2018 07/15/2018  Weight (lbs) 120 lb 9.6 oz 120 lb 4.8 oz 121 lb 7.6 oz  Weight (kg) 54.704 kg 54.568 kg 55.1 kg      Telemetry    NSR - Personally Reviewed  ECG    NSR 5/21 - Personally Reviewed  Physical Exam   GEN: No acute distress.   Neck: No JVD Cardiac: RRR, no murmurs, rubs, or gallops.  Respiratory: Clear to auscultation bilaterally except for atelectasis in the bases. GI: Soft, nontender, non-distended  MS: Trace LLE edema, no RLE edema; No deformity. Neuro:  Nonfocal  Psych: Normal affect   Labs    Chemistry Recent Labs  Lab 07/14/18 0210 07/15/18 0414 07/16/18 0351  NA 135 137 137  K 4.1 3.3* 3.7  CL 104 104 104  CO2 24 26 25   GLUCOSE 114* 105* 108*  BUN 21 14 11   CREATININE 0.74 0.71 0.66  CALCIUM 8.6* 8.5* 8.7*  PROT  --  4.8*  --   ALBUMIN  --  2.7*  --   AST  --  27  --   ALT  --  54*  --  ALKPHOS  --  48  --   BILITOT  --  0.9  --   GFRNONAA >60 >60 >60  GFRAA >60 >60 >60  ANIONGAP 7 7 8      Hematology Recent Labs  Lab 07/15/18 0414 07/16/18 0351 07/17/18 0759  WBC 11.1* 12.7* 14.1*  RBC 2.43* 2.55* 2.57*  HGB 7.5* 7.9* 7.9*  HCT 21.7* 23.0* 23.8*  MCV 89.3 90.2 92.6  MCH 30.9 31.0 30.7  MCHC 34.6 34.3 33.2  RDW 15.5 16.0* 16.8*  PLT 202 261 339    Cardiac EnzymesNo results for input(s): TROPONINI in the last 168 hours. No results for input(s): TROPIPOC in the last 168 hours.   BNPNo results for input(s): BNP, PROBNP in the last 168 hours.   DDimer No results for input(s): DDIMER in the last 168 hours.   Radiology    No results found.  Cardiac Studies   ECHO:  07/08/2018 1. The left ventricle has normal systolic function with an ejection fraction of 60-65%. The cavity size was normal. There is mildly increased left ventricular wall thickness. Left ventricular diastolic Doppler parameters are consistent with impaired  relaxation. Indeterminate filling pressures The E/e'  is 8-15. No evidence of left ventricular regional wall motion abnormalities. 2. The right ventricle has normal systolic function. The cavity was normal. There is no increase in right ventricular wall thickness. 3. The tricuspid valve is grossly normal. 4. The aortic valve is grossly normal. No stenosis of the aortic valve.  CARDIAC CATH: 07/08/2018  Prox RCA to Mid RCA lesion is 40% stenosed.  Ost LM lesion is 75% stenosed.  Mid LM lesion is 40% stenosed.  Ost Cx to Prox Cx lesion is 50% stenosed.  Prox Cx to Mid Cx lesion is 95% stenosed.  Prox LAD to Mid LAD lesion is 90% stenosed.  The left ventricular systolic function is normal.  LV end diastolic pressure is normal.  The left ventricular ejection fraction is greater than 65% by visual estimate.  Patient Profile     85 y.o. female with PMH HTN, HLD, anxiety, GERD admitted for chest pain, cath with 3V disease, now s/p CABG.  Assessment & Plan    CAD s/p CABG (LIMA-LAD, SVG-Diag, Sequential SVG to OM1-OM2, 07/10/18 Dr. Laneta SimmersBartle) -has had slow recovery given frailty/age -encouraged incentive spirometry, getting mucinex to help with cough -stable post operative blood loss anemia, Hgb 7.9 today -planned for short term rehab (prefers [facility]) -on aspirin 81 mg -on PO lasix 40 mg daily for post op volume removal. Weight today 54.7 kg, admission weight 52.6 kg. Suspect she will not need much beyond discharge. -No BMET yet today, stable yesterday. Would check at least one more time prior to discharge -on metoprolol tartrate 12.5 mg BID -on home simvastatin 20 mg. Given amiodarone and CAD, changed to rosuvastatin 5/26. Started with 10 mg dose, can increase to 20 mg dose if lower dose tolerated (and/or not at LDL goal) as this would then be a high intensity dose. -normal EF -encouraged her to use liquid acetaminophen for pain  Post op atrial fibrillation -on amiodarone 200 mg BID (has nausea even before amiodarone). LFTs nl this  admission, has known hypothyroidism on replacement levothyroxine. Can likely stop 30 days postop -in NSR currently  Hypertension: currently 120/56 -continue low dose BB as above -ACEi discontinued due to cough, amlodipine started today. Could also consider ARB.  Hyperlipidemia: LDL was 91 5/19 on home simvastatin. Goal LDL <70. -was on home simvastatin 20 mg. Given amiodarone  use and LDL not at goal, changed to rosuvastatin 5/26.  CARDIOLOGY RECOMMENDATIONS:  Discharge is anticipated in the next 48 hours. Recommendations for medications and follow up:  Discharge Medications: Continue medications as they are currently listed in the [facility]. Exceptions to the above:  Would use lasix for only a brief time post discharge, either one week or PRN weight based dosing  Likely discontinue amiodarone 30 days postop  Follow Up: The patient's Primary Cardiologist is Thurmon Fair, MD  Follow up in the office in 2-3 week(s).  Signed,  Jodelle Red, MD  11:28 AM 07/17/2018  [facility]  For questions or updates, please contact [facility] Please consult [email or web address] for contact info under     Signed, Jodelle Red, MD  07/17/2018, 11:28 AM

## 2018-07-17 NOTE — Discharge Summary (Addendum)
Physician Discharge Summary  Patient ID: Leah Pittman MRN: [id number] DOB/AGE: 23-Dec-1930 85 y.o.  Admit date: 07/08/2018 Discharge date: 07/19/2018  Admission Diagnoses:Severe CAD, progressive angina  Discharge Diagnoses:  Principal Problem:   Chest pain with high risk for cardiac etiology Active Problems:   CAD in native artery   S/P CABG x 4   Patient Active Problem List   Diagnosis Date Noted  . S/P CABG x 4 07/10/2018  . CAD in native artery 07/08/2018  . Essential hypertension 08/14/2017  . Hypercholesterolemia 11/30/2015  . Precordial chest pain 01/13/2015  . Chest pain with high risk for cardiac etiology 12/12/2013  . Glaucoma 09/29/2012  . Anxiety   . Labile hypertension   . Migraine headache    History of Present Illness:    at time of consultation This is an 85 year old female, who appears younger than her age, with a past medical history of hypertension,  hyperlipidemia, glaucoma, and hypothyroidism who has had complaints of chest, chest heaviness since March 2020. She has had these episodes several times per week and happen at rest and with exertion. Associated with this has been nausea, dyspnea on exertion, fatigue, swelling in her feet and toes. She had a gated cardiac CT done on 05/06/2018 and results showed severe stenosis in the proximal LAD and left Circumflex arteries. She was seen by cardiology on May 5th and it was decided to proceed with a cardiac catheterization. She was admitted to [facility] on 05/18 in order to undergo a cardiac catheterization.  Results showed proximal to mid LAD stenosis 90%, ostial LM 75% stenosis, proximal to mid Circumflex 95% stenosis and LVEF 65%. A cardiothoracic consultation has been requested with Dr. Laneta Simmers. Currently, she is eating lunch. She was given Lorazepam around 11:30 am for anxiety. She denies chest pain or shortness of breath and vital signs are stable.   Discharged Condition: good  Hospital Course: The patient was  admitted with increasing symptoms of angina and subsequently underwent cardiology evaluation including cardiac catheterization as described above.  She was felt to be a candidate to proceed with surgical intervention and on 07/10/2018 she was taken to the operating room where she underwent the below described procedure.  She tolerated it well and was taken to the surgical intensive care unit in stable condition.  Postoperative hospital course:  The patient has overall progressed slowly but well.  She did have an expected acute loss anemia which did require transfusion.  It is stabilized over time but remains relatively low.  Her most recent hemoglobin and hematocrit are 7.9/23.8 , she is also on iron supplement.  All routine lines, monitors and drainage devices have been discontinued in the standard fashion.  She did develop postoperative atrial fibrillation but has subsequently been chemically cardioverted to sinus rhythm with amiodarone.  She is also on low-dose beta-blocker.  She has had some postoperative difficulty with swallowing but this has shown a steady improvement and she has advanced to a soft diet.  She did have preoperative difficulties with dysphagia.  She has had postoperative volume overload but is responding well to diuretics.  She does have some postoperative atelectasis and small effusions which are improving over time.  She has been seen by PT and is felt she would benefit from a short stay in a rehabilitation facility which is being arranged by.  She has had some hypertension and has been started on low-dose Norvasc.  At the time of discharge the patient is felt to be  stable for the facility rehab.  Consults: cardiology  Significant Diagnostic Studies: angiography: Cardiac catheterization  Treatments: surgery:                                                                                CARDIOVASCULAR SURGERY OPERATIVE NOTE  07/10/2018  Surgeon:  Alleen BorneBryan K. Bartle, MD  First  Assistant: Gershon CraneWayne Gold,  PA-C   Preoperative Diagnosis:  Severe multi-vessel coronary artery disease   Postoperative Diagnosis:  Same   Procedure:  1. Median Sternotomy 2. Extracorporeal circulation 3.   Coronary artery bypass grafting x 4   Left internal mammary graft to the LAD  SVG to diagonal  Sequential SVG to OM1 and OM2  4.   Endoscopic vein harvest from the right leg   Anesthesia:  General Endotracheal  Discharge Exam: Blood pressure (!) 125/57, pulse 76, temperature 98 F (36.7 C), temperature source Oral, resp. rate (!) 23, height 5\' 4"  (1.626 m), weight 54.5 kg, SpO2 97 %.   General appearance: alert, cooperative and no distress Heart: regular rate and rhythm Lungs: clear to auscultation bilaterally Abdomen: soft, non-tender; bowel sounds normal; no masses,  no organomegaly Extremities: edema none appreciated, extensive ecchymosis RLE Wound: clean and dry  Disposition:   Discharge Instructions    Amb Referral to Cardiac Rehabilitation   Complete by:  As directed    Referring to [facility] CRP 2   Diagnosis:  CABG   CABG X ___:  4   After initial evaluation and assessments completed: Virtual Based Care may be provided alone or in conjunction with Phase 2 Cardiac Rehab based on patient barriers.:  Yes   Care order/instruction   Complete by:  As directed    Please remove chest tube sutures on 6/5     Allergies as of 07/19/2018      Reactions   Benadryl [diphenhydramine] Swelling, Other (See Comments)   Tongue swells and hallucinates- could not talk   Codeine Nausea And Vomiting, Hypertension   Meperidine Nausea Only, Swelling, Other (See Comments)   Tongue swells and "I feel like death"   Prednisone    Hypertension    Promethazine Other (See Comments)   Hypotension   Propranolol Nausea And Vomiting   Shellfish Allergy Nausea And Vomiting   Tape Other (See Comments)   Tears the skin   Tazarotene Other (See Comments)   Reaction not  recalled   Iodinated Diagnostic Agents Nausea Only, Rash      Medication List    STOP taking these medications   predniSONE 50 MG tablet Commonly known as:  DELTASONE     TAKE these medications   acetaminophen 500 MG tablet Commonly known as:  TYLENOL Take 500 mg by mouth every 8 (eight) hours as needed for mild pain or headache.   alum & mag hydroxide-simeth 200-200-20 MG/5ML suspension Commonly known as:  MAALOX/MYLANTA Take 30 mLs by mouth every 6 (six) hours as needed for indigestion or heartburn.   amiodarone 200 MG tablet Commonly known as:  PACERONE Take 1 tablet (200 mg total) by mouth daily.   aspirin 81 MG tablet Take 81 mg by mouth daily.   CALCIUM CITRATE+D3 PETITES PO Take 1 tablet  by mouth daily.   clotrimazole 10 MG troche Commonly known as:  MYCELEX Take 1 tablet (10 mg total) by mouth 5 (five) times daily for 5 days.   docusate sodium 100 MG capsule Commonly known as:  COLACE Take 100 mg by mouth daily as needed for mild constipation.   dorzolamide-timolol 22.3-6.8 MG/ML ophthalmic solution Commonly known as:  COSOPT Place 1 drop into both eyes 2 (two) times daily.   feeding supplement (ENSURE ENLIVE) Liqd Take 237 mLs by mouth 3 (three) times daily with meals.   ferrous fumarate-b12-vitamic C-folic acid capsule Commonly known as:  TRINSICON / FOLTRIN Take 1 capsule by mouth 2 (two) times daily after a meal.   lisinopril 5 MG tablet Commonly known as:  ZESTRIL Take 1 tablet (5 mg total) by mouth daily. What changed:    medication strength  how much to take   LORazepam 1 MG tablet Commonly known as:  ATIVAN Take 1 mg by mouth at bedtime.   magnesium oxide 400 MG tablet Commonly known as:  MAG-OX Take 400 mg by mouth daily.   metoprolol tartrate 25 MG tablet Commonly known as:  LOPRESSOR Take 0.5 tablets (12.5 mg total) by mouth 2 (two) times daily.   nitroGLYCERIN 0.4 MG SL tablet Commonly known as:  NITROSTAT Place 0.4 mg under  the tongue every 5 (five) minutes as needed for chest pain.   ondansetron 8 MG tablet Commonly known as:  ZOFRAN Take 8 mg by mouth 2 (two) times daily as needed for nausea or vomiting.   pantoprazole 40 MG tablet Commonly known as:  PROTONIX Take 40 mg by mouth 2 (two) times a day.   Refresh Tears 0.5 % Soln Generic drug:  carboxymethylcellulose Place 1-2 drops into both eyes as needed (for dryness or redness).   simvastatin 20 MG tablet Commonly known as:  ZOCOR Take 20 mg by mouth daily with supper.   Synthroid 88 MCG tablet Generic drug:  levothyroxine Take 88 mcg by mouth daily before breakfast.   Travatan Z 0.004 % Soln ophthalmic solution Generic drug:  Travoprost (BAK Free) Place 1 drop into both eyes at bedtime.   Vitamin D (Ergocalciferol) 1.25 MG (50000 UT) Caps capsule Commonly known as:  DRISDOL Take 50,000 Units by mouth every Monday.       Contact information for follow-up providers    [name], MD Follow up on 08/21/2018.   Specialty:  Cardiothoracic Surgery Why:  Appointment is at 11:00, please get CXR at [facility] located on first floor of our office building Contact information: [address] [place] [address] [phone]        Azalee Course, [place] Follow up on 07/26/2018.   Specialties:  Cardiology, Radiology Why:  Please arrive 15 min early for your 9:15am post-hospital cardiology appt. Contact information: [address] [place] [address] [phone]            Contact information for after-discharge care    Destination    [facility] .   Service:  Skilled Nursing Contact information: [address] [place] [address] [phone]                 The patient has been discharged on:   1.Beta Blocker:  Yes [place]ienne   ]  No   [   ]                              If No, reason:  2.Ace Inhibitor/ARB: Yes [ x  ]                                      No  [    ]                                     If No, reason:  3.Statin:   Yes [ y  ]                  No  [   ]                  If No, reason:  4.Ecasa:  Yes  [ y  ]                  No   [   ]                  If No, reason:  Signed:  Note By Gershon Crane PA-C   Updated by:  Lowella Dandy PA-C 07/19/2018, 7:55 AM

## 2018-07-17 NOTE — TOC Progression Note (Signed)
Transition of Care [facility]) - Progression Note    Patient Details  Name: Leah Pittman MRN: [id number] Date of Birth: [birth year]  [facility] CM/SW Contact  Margarito Liner, LCSW Phone Number: 07/17/2018, 2:12 PM  Clinical Narrative:  Leah Pittman will have a bed for her on Friday. They will need an updated COVID test. Last one was done 5/15. PA is aware and will order.  Expected Discharge Plan: Skilled Nursing Facility Barriers to Discharge: SNF Pending bed offer  Expected Discharge Plan and Services Expected Discharge Plan: Skilled Nursing Facility In-house Referral: Clinical Social Work       Expected Discharge Date: 07/09/18                                     Social Determinants of Health (SDOH) Interventions    Readmission Risk Interventions No flowsheet data found.

## 2018-07-17 NOTE — Progress Notes (Signed)
CARDIAC REHAB PHASE I   PRE:  Rate/Rhythm: 82 SR  BP:  Supine: 109/57  Sitting:   Standing:    SaO2: 96%RA  MODE:  Ambulation: 200 ft   POST:  Rate/Rhythm: 97 SR  BP:  Supine:   Sitting: 129/49  Standing:    SaO2: 97%RA 0930-1000 Assisted to bathroom and then pt walked 200 ft on RA with gait belt use and rolling walker and asst x 1..weak.  Tired easily. Took several standing rest breaks. Sats good on RA. Had not had breakfast yet. Notified staff. To recliner with call bell. Encouraged IS. PT to see later. Deconditioned.   Luetta Nutting, RN BSN  07/17/2018 9:57 AM

## 2018-07-17 NOTE — Progress Notes (Addendum)
Care plan has been reviewed. Pt has been progressing. Apparently she seemed very comfortable, present and had no pain. Vital signs remained stable, no distress has seen. She walked 2 rounds in hallway with walker tonight, tolerated well. Complained having constipation. Her last BM was 07/15/2018, pt stated it was very minimal. Pt preferred prune juice instead of laxative. Prune juice was given. Pacer wires removed to day at am, sternal wound and right leg EVH looked dry, clean and intact, no drainage. Continue to monitor.  Filiberto Pinks, SN,RN,PCCN,CMC,CSC

## 2018-07-17 NOTE — Progress Notes (Signed)
Physical Therapy Treatment Patient Details Name: Leah Pittman MRN: [id number] DOB: 1931/01/15 Today's Date: 07/17/2018    History of Present Illness 85 yo admitted 5/18 for cardiac cath, CABG 5/20 with post op Afib. PMhx: anxiety, HTN, HLD, glaucoma, hypothyroidism    PT Comments    Other than mobility to bathroom and ambulation in the halls, the majority of treatment based on safety and sternal precaution.   Follow Up Recommendations        Equipment Recommendations  None recommended by PT    Recommendations for Other Services       Precautions / Restrictions Precautions Precautions: Fall;Sternal Precaution Booklet Issued: Yes (comment) Precaution Comments: extensive education on maintaining sternal precautions    Mobility  Bed Mobility Overal bed mobility: Needs Assistance Bed Mobility: Sit to Supine Rolling: Min assist     Sit to supine: Min guard   General bed mobility comments: Worked on building momentum to come forward without use of arms  Transfers Overall transfer level: Needs assistance   Transfers: Sit to/from Stand Sit to Stand: Min assist         General transfer comment: from recliner and chair pt needed minimal assist to come forward and boost.  Ambulation/Gait Ambulation/Gait assistance: Min guard Gait Distance (Feet): 200 Feet Assistive device: Rolling walker (2 wheeled) Gait Pattern/deviations: Step-through pattern Gait velocity: slowed Gait velocity interpretation: <1.8 ft/sec, indicate of risk for recurrent falls General Gait Details: weak gait with ease fatigue.   Stairs             Wheelchair Mobility    Modified Rankin (Stroke Patients Only)       Balance Overall balance assessment: Mild deficits observed, not formally tested                                          Cognition Arousal/Alertness: Awake/alert Behavior During Therapy: WFL for tasks assessed/performed;Flat affect Overall  Cognitive Status: Within Functional Limits for tasks assessed                                        Exercises      General Comments General comments (skin integrity, edema, etc.): vss      Pertinent Vitals/Pain Pain Assessment: Faces Faces Pain Scale: Hurts little more Pain Location: chest Pain Descriptors / Indicators: Discomfort;Penetrating;Pins and needles Pain Intervention(s): Monitored during session(fatigue limited)    Home Living                      Prior Function            PT Goals (current goals can now be found in the care plan section) Acute Rehab PT Goals Patient Stated Goal: to go to rehab and then home PT Goal Formulation: With patient Time For Goal Achievement: 07/28/18 Potential to Achieve Goals: Good Progress towards PT goals: Progressing toward goals    Frequency    Min 3X/week      PT Plan Discharge plan needs to be updated    Co-evaluation              AM-PAC PT "6 Clicks" Mobility   Outcome Measure  Help needed turning from your back to your side while in a flat bed without using bedrails?: A Little Help needed moving  from lying on your back to sitting on the side of a flat bed without using bedrails?: A Little Help needed moving to and from a bed to a chair (including a wheelchair)?: A Little Help needed standing up from a chair using your arms (e.g., wheelchair or bedside chair)?: A Little Help needed to walk in hospital room?: A Little Help needed climbing 3-5 steps with a railing? : A Lot 6 Click Score: 17    End of Session   Activity Tolerance: Patient tolerated treatment well Patient left: in chair;with call bell/phone within reach Nurse Communication: Mobility status;Precautions PT Visit Diagnosis: Other abnormalities of gait and mobility (R26.89);Muscle weakness (generalized) (M62.81);Difficulty in walking, not elsewhere classified (R26.2)     Time: [id number] PT Time Calculation (min)  (ACUTE ONLY): 25 min  Charges:  $Gait Training: 8-22 mins $Therapeutic Activity: 8-22 mins                     07/17/2018  [place] BingKen Abella Shugart, PT Acute Rehabilitation Services [phone]  (pager) [phone]  (office)   Eliseo GumKenneth V Avrielle Fry 07/17/2018, 5:49 PM

## 2018-07-17 NOTE — Telephone Encounter (Signed)
New Message    Pt has TOC per Dot Lanes with Lisabeth Devoid on 07/26/18 at 9:15am

## 2018-07-17 NOTE — Progress Notes (Addendum)
[address]       [place] [address]             [phone]      7 Days Post-Op Procedure(s) (LRB): CORONARY ARTERY BYPASS GRAFTING (CABG), FREE LIMA (N/A) TRANSESOPHAGEAL ECHOCARDIOGRAM (TEE) (N/A) Subjective: Multiple discomforts, main c/o this am is cough and difficulty sleeping, tol soft diet  Objective: Vital signs in last 24 hours: Temp:  [97.4 F (36.3 C)-98.5 F (36.9 C)] 98.5 F (36.9 C) (05/27 0426) Pulse Rate:  [74-91] 91 (05/27 0426) Cardiac Rhythm: Normal sinus rhythm (05/27 0700) Resp:  [19-25] 21 (05/27 0426) BP: (110-161)/(50-75) 147/66 (05/27 0426) SpO2:  [99 %-100 %] 99 % (05/27 0426) Weight:  [54.7 kg] 54.7 kg (05/27 0434)  Hemodynamic parameters for last 24 hours:    Intake/Output from previous day: 05/26 0701 - 05/27 0700 In: 610 [P.O.:610] Out: 500 [Urine:500] Intake/Output this shift: No intake/output data recorded.  General appearance: alert, cooperative and no distress Heart: regular rate and rhythm Lungs: dim in bases Abdomen: benign Extremities: no edema Wound: incis healing well  Lab Results: Recent Labs    07/15/18 0414 07/16/18 0351  WBC 11.1* 12.7*  HGB 7.5* 7.9*  HCT 21.7* 23.0*  PLT 202 261   BMET:  Recent Labs    07/15/18 0414 07/16/18 0351  NA 137 137  K 3.3* 3.7  CL 104 104  CO2 26 25  GLUCOSE 105* 108*  BUN 14 11  CREATININE 0.71 0.66  CALCIUM 8.5* 8.7*    PT/INR: No results for input(s): LABPROT, INR in the last 72 hours. ABG    Component Value Date/Time   PHART 7.416 07/10/2018 2042   HCO3 22.0 07/10/2018 2042   TCO2 23 07/10/2018 2042   ACIDBASEDEF 2.0 07/10/2018 2042   O2SAT 100.0 07/10/2018 2042   CBG (last 3)  No results for input(s): GLUCAP in the last 72 hours.  Meds Scheduled Meds: . amiodarone  200 mg Oral BID  . aspirin EC  81 mg Oral Daily  . Chlorhexidine Gluconate Cloth  6 each Topical Daily  . clotrimazole  10 mg Oral 5 X Daily  . docusate sodium  200 mg  Oral Daily  . dorzolamide-timolol  1 drop Both Eyes BID  . enoxaparin (LOVENOX) injection  30 mg Subcutaneous Q24H  . feeding supplement  1 Container Oral TID BM  . feeding supplement (ENSURE ENLIVE)  237 mL Oral TID WC  . ferrous fumarate-b12-vitamic C-folic acid  1 capsule Oral BID PC  . latanoprost  1 drop Both Eyes QHS  . levothyroxine  88 mcg Oral QAC breakfast  . lisinopril  2.5 mg Oral Daily  . mouth rinse  15 mL Mouth Rinse BID  . metoprolol tartrate  12.5 mg Oral BID  . pantoprazole  40 mg Oral Daily  . rosuvastatin  10 mg Oral q1800  . sodium chloride flush  3 mL Intravenous Q12H   Continuous Infusions: . sodium chloride 250 mL (07/11/18 0608)   PRN Meds:.acetaminophen (TYLENOL) oral liquid 160 mg/5 mL, LORazepam, metoprolol tartrate, ondansetron (ZOFRAN) IV, polyethylene glycol, sodium chloride flush  Xrays No results found.  Assessment/Plan: S/P Procedure(s) (LRB): CORONARY ARTERY BYPASS GRAFTING (CABG), FREE LIMA (N/A) TRANSESOPHAGEAL ECHOCARDIOGRAM (TEE) (N/A)  1 doing well overall 2 more hypertensive yesterday, will d/c lisinopril in case it is contributing to cough and add norvasc instead. Maintaining NSR 3 sats excellent on RA, will add mucinex to help with secretions, lung exam is pretty  unremarkable and atx and effus improved on yesterdays CXR 4 no fevers, will repeat WBC tomorrow as has minor leukocytosis 5 BS well controlled 6 cont soft diet, may need dysphagia eval at some point but tolerating without significant sx if takes small bites 7 mild volume overload , cont lasix for now   LOS: 9 days    Rowe ClackWayne E [facility] LL[facility] 07/17/2018 Pager [phone]   Chart reviewed, patient examined, agree with above. She continues to improve. Should be ready to go to [facility] [place] on Friday.

## 2018-07-18 LAB — GLUCOSE, CAPILLARY: Glucose-Capillary: 100 mg/dL — ABNORMAL HIGH (ref 70–99)

## 2018-07-18 MED ORDER — LACTULOSE 10 GM/15ML PO SOLN
10.0000 g | Freq: Once | ORAL | Status: AC
  Administered 2018-07-18: 10 g via ORAL
  Filled 2018-07-18: qty 15

## 2018-07-18 NOTE — Progress Notes (Signed)
Nutrition Follow-up  DOCUMENTATION CODES:   Not applicable  INTERVENTION:   D/C Boost Breeze   Continue Ensure Enlive po BID, each supplement provides 350 kcal and 20 grams of protein  Monitor for BM results  NUTRITION DIAGNOSIS:   Inadequate oral intake related to nausea, vomiting as evidenced by meal completion < 25%, other (comment)(chart review).  Progressing  GOAL:   Patient will meet greater than or equal to 90% of their needs  Progressing  MONITOR:   PO intake, Supplement acceptance, Diet advancement, Labs, Weight trends  REASON FOR ASSESSMENT:   NPO/Clear Liquid Diet    ASSESSMENT:   85 year old female who presented on 5/19 for cardiac catheterization. PMH of HLD, HTN.  5/19 - cardiac catheterization, episode of N/V and bradycardia 5/20 - s/p CABG x 4, extubated   RD working remotely.  Spoke with pt via phone. Reports her appetite is back to baseline. She typically isn't a big eater but eats frequently throughout the day. Meal completions charted as 25-100% for her last 8 meals. She is compliant with Ensure but does not drink Boost due to acid reflux.   Pt has not had BM in 4 days. Lactulose and colace were given this am. No result yet.   Weight noted to decrease from 56.6 kg on 5/22 to 54.7 kg today.   Plan for D/C to rehab tomorrow.   I/O: +43 ml since admit UOP: 900 ml x 24 hrs  Medications: amiodarone, colace, foltrin Labs: CBG 100-108  Diet Order:   Diet Order            DIET SOFT Room service appropriate? Yes with Assist; Fluid consistency: Thin  Diet effective now              EDUCATION NEEDS:   No education needs have been identified at this time  Skin:  Skin Assessment: Skin Integrity Issues: Skin Integrity Issues:: Other (Comment) Other: surgical incisions to chest and right leg  Last BM:  5/24  Height:   Ht Readings from Last 1 Encounters:  07/12/18 5\' 4"  (1.626 m)    Weight:   Wt Readings from Last 1  Encounters:  07/18/18 54.7 kg    Ideal Body Weight:  54.5 kg  BMI:  Body mass index is 20.72 kg/m.  Estimated Nutritional Needs:   Kcal:  1350-1550  Protein:  60-75 grams  Fluid:  1.4-1.6 L   Vanessa Kick RD, LDN Clinical Nutrition Pager # [phone]

## 2018-07-18 NOTE — Progress Notes (Signed)
8 Days Post-Op Procedure(s) (LRB): CORONARY ARTERY BYPASS GRAFTING (CABG), FREE LIMA (N/A) TRANSESOPHAGEAL ECHOCARDIOGRAM (TEE) (N/A) Subjective:  Only complaint today is constipation. No BM in 4 days according to her.  Objective: Vital signs in last 24 hours: Temp:  [98 F (36.7 C)-99.6 F (37.6 C)] 98 F (36.7 C) (05/28 0422) Pulse Rate:  [77-88] 85 (05/28 0422) Cardiac Rhythm: Normal sinus rhythm (05/28 0700) Resp:  [14-26] 19 (05/28 0422) BP: (105-129)/(46-53) 119/46 (05/28 0422) SpO2:  [95 %-99 %] 95 % (05/28 0422) Weight:  [54.7 kg] 54.7 kg (05/28 0422)  Hemodynamic parameters for last 24 hours:    Intake/Output from previous day: 05/27 0701 - 05/28 0700 In: 240 [P.O.:240] Out: 900 [Urine:900] Intake/Output this shift: No intake/output data recorded.  General appearance: alert and cooperative Heart: regular rate and rhythm, S1, S2 normal, no murmur Lungs: clear to auscultation bilaterally Abdomen: soft, non-tender; bowel sounds normal Extremities: extremities normal, no edema Wound: chest incision healing well. chest wall ecchymosis stable.  Lab Results: Recent Labs    07/16/18 0351 07/17/18 0759  WBC 12.7* 14.1*  HGB 7.9* 7.9*  HCT 23.0* 23.8*  PLT 261 339   BMET:  Recent Labs    07/16/18 0351  NA 137  K 3.7  CL 104  CO2 25  GLUCOSE 108*  BUN 11  CREATININE 0.66  CALCIUM 8.7*    PT/INR: No results for input(s): LABPROT, INR in the last 72 hours. ABG    Component Value Date/Time   PHART 7.416 07/10/2018 2042   HCO3 22.0 07/10/2018 2042   TCO2 23 07/10/2018 2042   ACIDBASEDEF 2.0 07/10/2018 2042   O2SAT 100.0 07/10/2018 2042   CBG (last 3)  No results for input(s): GLUCAP in the last 72 hours.  Assessment/Plan: S/P Procedure(s) (LRB): CORONARY ARTERY BYPASS GRAFTING (CABG), FREE LIMA (N/A) TRANSESOPHAGEAL ECHOCARDIOGRAM (TEE) (N/A)  POD 8 She is hemodynamically stable in sinus rhythm on oral amiodarone.  Wt is 6 lbs over preop but  she has no edema.  Constipation: will give her a dose of lactulose today. Continue colace and Miralax.  Continue mobilization  She will have a bed at [facility] tomorrow so should be ready for discharge by then.   LOS: 10 days    [name] 07/18/2018

## 2018-07-18 NOTE — Progress Notes (Signed)
CARDIAC REHAB PHASE I   PRE:  Rate/Rhythm: 74 SR  BP:  Supine:   Sitting: 115/56  Standing:    SaO2: 96%RA  MODE:  Ambulation: 320 ft   POST:  Rate/Rhythm: 92 SR   BP:  Supine:   Sitting: 129/55  Standing:    SaO2: 97%RA 1125-1200 Pt walked 320 ft on RA with gait belt use, rolling walker and asst x 1. Tires easily and stopped several times to rest. Assisted to recliner and adjusted chair x 3 to get pt comfortable.  Encouraged IS. Discussed CRP 2 and referred to [facility] program. Discussed sternal precautions. Will follow up tomorrow.    Luetta Nutting, RN BSN  07/18/2018 11:55 AM

## 2018-07-18 NOTE — Telephone Encounter (Signed)
Still admitted

## 2018-07-18 NOTE — Progress Notes (Signed)
Occupational Therapy Treatment Patient Details Name: Leah MillinMary E Geisinger MRN: [id number] DOB: 15-May-1930 Today's Date: 07/18/2018    History of present illness 85 yo admitted 5/18 for cardiac cath, CABG 5/20 with post op Afib. PMhx: anxiety, HTN, HLD, glaucoma, hypothyroidism   OT comments  Pt performing toilet hygiene and ADL routine at sink with sitting and standing. Pt aware of sternal precautions and allowed assist for bathing backside and combing hair.  Pt progressing, but continues to require minA for powerup and increased pain with all mobility. Pt unable to properly care for self at home and spouse with cancer. Pt agreed to SNF. OT follow acutely for energy conservation and ADL.    Follow Up Recommendations  SNF;Supervision/Assistance - 24 hour    Equipment Recommendations  Other (comment)(to be determined)    Recommendations for Other Services      Precautions / Restrictions Precautions Precautions: Fall;Sternal Precaution Booklet Issued: Yes (comment) Precaution Comments: education on maintaining sternal precautions Restrictions Weight Bearing Restrictions: No       Mobility Bed Mobility Overal bed mobility: Needs Assistance Bed Mobility: Sit to Supine Rolling: Min assist         General bed mobility comments: for trunk elevation  Transfers Overall transfer level: Needs assistance Equipment used: Rolling walker (2 wheeled) Transfers: Sit to/from Stand Sit to Stand: Min assist Stand pivot transfers: Min assist            Balance Overall balance assessment: Mild deficits observed, not formally tested                                         ADL either performed or assessed with clinical judgement   ADL Overall ADL's : Needs assistance/impaired     Grooming: Wash/dry hands;Wash/dry face;Oral care;Applying deodorant;Brushing hair;Supervision/safety;Sitting;Standing   Upper Body Bathing: Minimal assistance;Sitting   Lower Body  Bathing: Minimal assistance;Sitting/lateral leans;Sit to/from stand   Upper Body Dressing : Minimal assistance;Sitting;Standing   Lower Body Dressing: Minimal assistance;Sitting/lateral leans;Sit to/from stand   Toilet Transfer: Minimal assistance;Ambulation;RW   Toileting- Clothing Manipulation and Hygiene: Minimal assistance;Sit to/from stand       Functional mobility during ADLs: Min guard;Rolling walker General ADL Comments: minguardA in room for mobility and transfers with minA overall for powerup and stability; pt performing toilet hygiene and bathing/dressing at sink in sitting and standing with minA overall.     Vision   Vision Assessment?: Vision impaired- to be further tested in functional context Additional Comments: Pt with glaucoma at baseline   Perception     Praxis      Cognition Arousal/Alertness: Awake/alert Behavior During Therapy: WFL for tasks assessed/performed;Flat affect Overall Cognitive Status: Within Functional Limits for tasks assessed                                          Exercises     Shoulder Instructions       General Comments vss    Pertinent Vitals/ Pain       Pain Assessment: 0-10 Pain Score: 6  Pain Location: chest Pain Descriptors / Indicators: Discomfort;Sore;Throbbing  Home Living  Prior Functioning/Environment              Frequency  Min 2X/week        Progress Toward Goals  OT Goals(current goals can now be found in the care plan section)  Progress towards OT goals: Progressing toward goals  Acute Rehab OT Goals Patient Stated Goal: to go to rehab and then home OT Goal Formulation: With patient Time For Goal Achievement: 07/29/18 Potential to Achieve Goals: Good ADL Goals Pt Will Perform Grooming: Independently;standing Pt Will Perform Lower Body Dressing: with modified independence;sit to/from stand Pt Will Transfer to  Toilet: with modified independence;ambulating;bedside commode Pt Will Perform Toileting - Clothing Manipulation and hygiene: with modified independence;sit to/from stand Additional ADL Goal #1: Pt will be Mod I in and OOB for basic ADLs  Plan Discharge plan remains appropriate    Co-evaluation                 AM-PAC OT "6 Clicks" Daily Activity     Outcome Measure   Help from another person eating meals?: None Help from another person taking care of personal grooming?: A Little Help from another person toileting, which includes using toliet, bedpan, or urinal?: A Little Help from another person bathing (including washing, rinsing, drying)?: A Lot Help from another person to put on and taking off regular upper body clothing?: A Little Help from another person to put on and taking off regular lower body clothing?: A Lot 6 Click Score: 17    End of Session Equipment Utilized During Treatment: Gait belt;Rolling walker  OT Visit Diagnosis: Unsteadiness on feet (R26.81);Other abnormalities of gait and mobility (R26.89);Muscle weakness (generalized) (M62.81)   Activity Tolerance Patient tolerated treatment well   Patient Left in chair;with chair alarm set;with call bell/phone within reach   Nurse Communication Mobility status        Time: 1006-1100 OT Time Calculation (min): 54 min  Charges: OT General Charges $OT Visit: 1 Visit OT Treatments $Self Care/Home Management : 38-52 mins $Therapeutic Activity: 8-22 mins  Cristi Loron) Glendell Docker OTR/L Acute Rehabilitation Services Pager: [phone] Office: [phone]    Gunnar Fusi Jorie Zee 07/18/2018, 2:06 PM

## 2018-07-18 NOTE — Care Management Important Message (Signed)
Important Message  Patient Details  Name: Leah Pittman MRN: [id number] Date of Birth: August 04, 1930   Medicare Important Message Given:  Yes    [name] 07/18/2018, 3:30 PM

## 2018-07-19 MED ORDER — METOPROLOL TARTRATE 25 MG PO TABS: 12.5000 mg | ORAL_TABLET | Freq: Two times a day (BID) | ORAL | 3 refills | Status: DC

## 2018-07-19 MED ORDER — CLOTRIMAZOLE 10 MG MT TROC: 10.0000 mg | Freq: Every day | OROMUCOSAL | 0 refills | Status: AC

## 2018-07-19 MED ORDER — FLEET ENEMA 7-19 GM/118ML RE ENEM
1.0000 | ENEMA | Freq: Once | RECTAL | Status: AC
  Administered 2018-07-19: 1 via RECTAL
  Filled 2018-07-19: qty 1

## 2018-07-19 MED ORDER — LISINOPRIL 5 MG PO TABS: 5.0000 mg | ORAL_TABLET | Freq: Every day | ORAL | 3 refills | Status: DC

## 2018-07-19 MED ORDER — ENSURE ENLIVE PO LIQD: 237.0000 mL | Freq: Three times a day (TID) | ORAL | 12 refills | Status: DC

## 2018-07-19 MED ORDER — AMIODARONE HCL 200 MG PO TABS: 200.0000 mg | ORAL_TABLET | Freq: Every day | ORAL | Status: DC

## 2018-07-19 MED ORDER — FE FUMARATE-B12-VIT C-FA-IFC PO CAPS: 1.0000 | ORAL_CAPSULE | Freq: Two times a day (BID) | ORAL | Status: DC

## 2018-07-19 NOTE — Progress Notes (Signed)
CARDIAC REHAB PHASE I   PRE:  Rate/Rhythm: 68 SR    BP: sitting 108/41    SaO2: 97 RA  MODE:  Ambulation: 180 ft   POST:  Rate/Rhythm: 78 SR    BP: sitting 111/58     SaO2: 95 RA  Pt c/o constipation. Upset about this. Ambulated 45 min ago but willing to walk again if it would help her constipation. She was able to stand slowly and walk with RW. Short distance, "I'm just exhausted". Return to recliner, turned slightly on her side for comfort. Discussed sternal precautions, IS (pt is not doing frequently enough, only able to do 500 mL), walking in rehab and at home. She voices understanding but is quite fixated on her constipation.  [id number]   Harriet Masson CES, ACSM 07/19/2018 11:18 AM

## 2018-07-19 NOTE — Discharge Instructions (Signed)
1. Please obtain vital signs at least one time daily 2.Please weigh the patient daily. If he or she continues to gain weight or develops lower extremity edema, contact the office at [phone]. 3. Ambulate patient at least three times daily and please use sternal precautions. 4.Please remove chest tube sutures on 07/26/18   Endoscopic Saphenous Vein Harvesting, Care After Refer to this sheet in the next few weeks. These instructions provide you with information about caring for yourself after your procedure. Your health care provider may also give you more specific instructions. Your treatment has been planned according to current medical practices, but problems sometimes occur. Call your health care provider if you have any problems or questions after your procedure. What can I expect after the procedure? After the procedure, it is common to have:  Pain.  Bruising.  Swelling.  Numbness. Follow these instructions at home: Medicine  Take over-the-counter and prescription medicines only as told by your health care provider.  Do not drive or operate heavy machinery while taking prescription pain medicine. Incision care   Follow instructions from your health care provider about how to take care of the cut made during surgery (incision). Make sure you: ? Wash your hands with soap and water before you change your bandage (dressing). If soap and water are not available, use hand sanitizer. ? Change your dressing as told by your health care provider. ? Leave stitches (sutures), skin glue, or adhesive strips in place. These skin closures may need to be in place for 2 weeks or longer. If adhesive strip edges start to loosen and curl up, you may trim the loose edges. Do not remove adhesive strips completely unless your health care provider tells you to do that.  Check your incision area every day for signs of infection. Check for: ? More redness, swelling, or pain. ? More fluid or  blood. ? Warmth. ? Pus or a bad smell. General instructions  Raise (elevate) your legs above the level of your heart while you are sitting or lying down.  Do any exercises your health care providers have given you. These may include deep breathing, coughing, and walking exercises.  Do not shower, take baths, swim, or use a hot tub unless told by your health care provider.  Wear your elastic stocking if told by your health care provider.  Keep all follow-up visits as told by your health care provider. This is important. Contact a health care provider if:  Medicine does not help your pain.  Your pain gets worse.  You have new leg bruises or your leg bruises get bigger.  You have a fever.  Your leg feels numb.  You have more redness, swelling, or pain around your incision.  You have more fluid or blood coming from your incision.  Your incision feels warm to the touch.  You have pus or a bad smell coming from your incision. Get help right away if:  Your pain is severe.  You develop pain, tenderness, warmth, redness, or swelling in any part of your leg.  You have chest pain.  You have trouble breathing. This information is not intended to replace advice given to you by your health care provider. Make sure you discuss any questions you have with your health care provider. Document Released: 10/19/2010 Document Revised: 07/15/2015 Document Reviewed: 12/21/2014 Elsevier Interactive Patient Education  2019 [employer] Coronary Artery Bypass Grafting, Care After This sheet gives you information about how to care for yourself after your procedure.  Your doctor may also give you more specific instructions. If you have problems or questions, contact your doctor. Follow these instructions at home: Medicines  Take over-the-counter and prescription medicines only as told by your doctor. Do not stop taking medicines or start any new medicines unless your doctor says it is  okay.  If you were prescribed an antibiotic medicine, take it as told by your doctor. Do not stop taking the antibiotic even if you start to feel better.  Do not drive or use heavy machinery while taking prescription pain medicine. Incision care      Follow instructions from your doctor about how to take care of your incisions. Make sure you: ? Wash your hands with soap and water before you change your bandage (dressing). If you cannot use soap and water, use hand sanitizer. ? Change your dressing as told by your doctor. ? Leave stitches (sutures), skin glue, or skin tape (adhesive) strips in place. They may need to stay in place for 2 weeks or longer. If tape strips get loose and curl up, you may trim the loose edges. Do not remove tape strips completely unless your doctor says it is okay.  Make sure the incisions are clean, dry, and protected.  Check your incision areas every day for signs of infection. Check for: ? Redness, swelling, or pain. ? Fluid or blood. ? Warmth. ? Pus or a bad smell.  If cuts were made in your legs: ? Avoid crossing your legs. ? Avoid sitting for long periods of time. Change positions every 30 minutes. ? Raise (elevate) your legs when you are sitting. Bathing  Do not take baths, swim, or use a hot tub until your doctor says it is okay.  Only take sponge baths. Pat the incisions dry. Do not rub the incisions to dry.  Ask your doctor when you can shower. Eating and drinking  Eat foods that are high in fiber, such as raw fruits and vegetables, whole grains, beans, and nuts. Meats should be lean cut. Avoid canned, processed, and fried foods. This can help prevent constipation. This is also a recommended part of a heart-healthy diet.  Drink enough fluid to keep your urine clear or pale yellow.  Limit alcohol intake to no more than 1 drink a day for nonpregnant women and 2 drinks a day for men. One drink equals 12 oz of beer, 5 oz of wine, or 1 oz of  hard liquor. Activity  Rest and limit your activity as told by your doctor. You may be told to: ? Stop any activity right away if you have chest pain, shortness of breath, irregular heartbeats, or dizziness. Get help right away if you have any of these symptoms. ? Move around often for short periods or take short walks as told by your doctor. Slowly increase your activities. You may need physical therapy or cardiac rehabilitation. ? Avoid lifting, pushing, or pulling anything that is heavier than 10 lb (4.5 kg) for at least 6 weeks or as told by your doctor.  Do not drive until your doctor says it is okay.  Ask your doctor when you can go back to work.  Ask your doctor when you can be sexually active. General instructions   Do not use any products that contain nicotine or tobacco, such as cigarettes and e-cigarettes. If you need help quitting, ask your doctor.  Take 2-3 deep breaths every few hours during the day while you get better. This helps expand your lungs  and prevent complications.  If you were given a device called an incentive spirometer, use it several times a day to practice deep breathing. Support your chest with a pillow or your arms when you take deep breaths or cough.  Wear compression stockings as told by your doctor. These stockings help to prevent blood clots and reduce swelling in your legs.  Weigh yourself every day. This helps to see if your body is holding (retaining) fluid that may make your heart and lungs work harder.  Keep all follow-up visits as told by your doctor. This is important. Contact a doctor if:  You have more redness, swelling, or pain around any cut.  You have more fluid or blood coming from any cut.  Any cut feels warm to the touch.  You have pus or a bad smell coming from any cut.  You have a fever.  You have swelling in your ankles or legs.  You have pain in your legs.  You gain 2 or more pounds (0.9 kg) a day.  You feel sick to  your stomach (nauseous) or throw up (vomit).  You have watery poop (diarrhea). Get help right away if:  You have chest pain that goes to your jaw or arms.  You are short of breath.  You have a fast or irregular heartbeat.  You notice a "clicking" in your breastbone (sternum) when you move.  You feel numb or weak in your arms or legs.  You feel dizzy or light-headed. Summary  After the procedure, it is common to have pain or discomfort in the incision areas.  Do not take baths, swim, or use a hot tub until your health care provider approves.  Slowly increase your activities. You may need physical therapy or cardiac rehabilitation.  Weigh yourself every day. This helps to see if your body is holding (retaining) fluid that may make your heart and lungs work harder. This information is not intended to replace advice given to you by your health care provider. Make sure you discuss any questions you have with your health care provider. Document Released: 02/11/2013 Document Revised: 03/21/2016 Document Reviewed: 03/21/2016 Elsevier Interactive Patient Education  2019 [employer].

## 2018-07-19 NOTE — Progress Notes (Signed)
Physical Therapy Treatment Patient Details Name: Leah Pittman MRN: [id number] DOB: 1930-08-25 Today's Date: 07/19/2018    History of Present Illness 85 yo admitted 5/18 for cardiac cath, CABG 5/20 with post op Afib. PMhx: anxiety, HTN, HLD, glaucoma, hypothyroidism    PT Comments    Pt was very fatigued from trying to get her bowels to move today.  Emphasis on gait stability/stamina and then set up for a bath at the sink.  Reinforced sternal precautions as they relate to different facets of mobility.   Follow Up Recommendations  Supervision/Assistance - 24 hour;SNF     Equipment Recommendations  None recommended by PT    Recommendations for Other Services       Precautions / Restrictions Precautions Precautions: Fall;Sternal Precaution Comments: education on maintaining sternal precautions    Mobility  Bed Mobility               General bed mobility comments: OOB in bathroom on arrival  Transfers Overall transfer level: Needs assistance Equipment used: Rolling walker (2 wheeled) Transfers: Sit to/from Stand Sit to Stand: Min assist         General transfer comment: extra assist without armrest and due to poor vision in unfamiliar surronundings  Ambulation/Gait Ambulation/Gait assistance: Min guard Gait Distance (Feet): 200 Feet Assistive device: Rolling walker (2 wheeled) Gait Pattern/deviations: Step-through pattern Gait velocity: slowed   General Gait Details: Still fatigued, but more steady using the RW.  Min guard was safe in an open space.   Stairs             Wheelchair Mobility    Modified Rankin (Stroke Patients Only)       Balance Overall balance assessment: Needs assistance;Mild deficits observed, not formally tested   Sitting balance-Leah Pittman: Good     Standing balance support: Bilateral upper extremity supported;Single extremity supported Standing balance-Leah Pittman: Poor                               Cognition Arousal/Alertness: Awake/alert Behavior During Therapy: WFL for tasks assessed/performed;Flat affect Overall Cognitive Status: Within Functional Limits for tasks assessed                                        Exercises      General Comments        Pertinent Vitals/Pain Pain Assessment: Faces Faces Pain Pittman: Hurts little more Pain Location: chest, arms Pain Descriptors / Indicators: Discomfort;Sore Pain Intervention(s): Monitored during session    Home Living                      Prior Function            PT Goals (current goals can now be found in the care plan section) Acute Rehab PT Goals Patient Stated Goal: to go to rehab and then home PT Goal Formulation: With patient Time For Goal Achievement: 07/28/18 Potential to Achieve Goals: Good Progress towards PT goals: Progressing toward goals    Frequency    Min 3X/week      PT Plan Discharge plan needs to be updated    Co-evaluation              AM-PAC PT "6 Clicks" Mobility   Outcome Measure  Help needed turning from your back to your side while in  a flat bed without using bedrails?: A Little Help needed moving from lying on your back to sitting on the side of a flat bed without using bedrails?: A Little Help needed moving to and from a bed to a chair (including a wheelchair)?: A Little Help needed standing up from a chair using your arms (e.g., wheelchair or bedside chair)?: A Little Help needed to walk in hospital room?: A Little Help needed climbing 3-5 steps with a railing? : A Lot 6 Click Score: 17    End of Session   Activity Tolerance: Patient tolerated treatment well Patient left: in chair;with call bell/phone within reach Nurse Communication: Mobility status;Precautions PT Visit Diagnosis: Other abnormalities of gait and mobility (R26.89);Muscle weakness (generalized) (M62.81);Difficulty in walking, not elsewhere classified (R26.2)     Time:  [id number] PT Time Calculation (min) (ACUTE ONLY): 20 min  Charges:  $Gait Training: 8-22 mins                     07/19/2018  Swansea BingKen Clemente Dewey, PT Acute Rehabilitation Services [phone]  (pager) [phone]  (office)   Eliseo GumKenneth V Darcy Pittman 07/19/2018, 3:37 PM

## 2018-07-19 NOTE — TOC Progression Note (Signed)
Transition of Care [facility]) - Progression Note    Patient Details  Name: Leah Pittman MRN: [id number] Date of Birth: 03/09/1930  [facility]) CM/SW Contact  Margarito Liner, LCSW Phone Number: 07/19/2018, 12:49 PM  Clinical Narrative: SNF is ready to accept patient once discharge order is in.  Expected Discharge Plan: Skilled Nursing Facility Barriers to Discharge: SNF Pending bed offer  Expected Discharge Plan and Services Expected Discharge Plan: Skilled Nursing Facility In-house Referral: Clinical Social Work       Expected Discharge Date: 07/09/18                                     Social Determinants of Health (SDOH) Interventions    Readmission Risk Interventions No flowsheet data found.

## 2018-07-19 NOTE — TOC Transition Note (Addendum)
Transition of Care [facility]) - CM/SW Discharge Note   Patient Details  Name: Leah Pittman MRN: [id number] Date of Birth: 1930/12/27  [facility]) CM/SW Contact:  Margarito Liner, LCSW Phone Number: 07/19/2018, 2:50 PM   Clinical Narrative: CSW facilitated patient discharge including contacting patient family and facility to confirm patient discharge plans. Clinical information faxed to facility and family agreeable with plan. CSW arranged ambulance transport via [facility] to [facility] at 3:45 pm. RN to call report prior to discharge [phone] ext [phone] Room 710).  CSW will sign off for now as social work intervention is no longer needed. Please consult [place] again if new needs arise.  Final next level of care: Skilled Nursing Facility Barriers to Discharge: Barriers Resolved   Patient Goals and CMS Choice   CMS Medicare.gov Compare Post Acute Care list provided to:: Other (Comment Required)(patient has been to SNF before and wanted to go back  to [facility]) Choice offered to / list presented to : NA(Patient already knew her choice of SNF)  Discharge Placement   Existing PASRR number confirmed : 07/16/18          Patient chooses bed at: [facility] [place] Patient to be transferred to facility by: [facility] Name of family member notified: Tristan Schroeder Patient and family notified of of transfer: 07/19/18  Discharge Plan and Services In-house Referral: Clinical Social Work                                   Social Determinants of Health (SDOH) Interventions     Readmission Risk Interventions No flowsheet data found.

## 2018-07-19 NOTE — Progress Notes (Signed)
      [address]       [place] [address]             [phone]      9 Days Post-Op Procedure(s) (LRB): CORONARY ARTERY BYPASS GRAFTING (CABG), FREE LIMA (N/A) TRANSESOPHAGEAL ECHOCARDIOGRAM (TEE) (N/A)   Subjective:  Patient with no new complaints.  Was not able to move bowels yesterday.  She continues to have soreness along her chest and right lower leg due to extensive bruising.  States she has some numbness in lower leg  Objective: Vital signs in last 24 hours: Temp:  [97.6 F (36.4 C)-98.4 F (36.9 C)] 98 F (36.7 C) (05/29 0311) Pulse Rate:  [76-81] 76 (05/29 0004) Cardiac Rhythm: Normal sinus rhythm (05/28 1903) Resp:  [21-25] 23 (05/29 0311) BP: (116-126)/(46-57) 125/57 (05/29 0311) SpO2:  [95 %-99 %] 97 % (05/29 0311) Weight:  [54.5 kg] 54.5 kg (05/29 0309)  Intake/Output from previous day: 05/28 0701 - 05/29 0700 In: 480 [P.O.:480] Out: 1150 [Urine:1150]  General appearance: alert, cooperative and no distress Heart: regular rate and rhythm Lungs: clear to auscultation bilaterally Abdomen: soft, non-tender; bowel sounds normal; no masses,  no organomegaly Extremities: edema none appreciated, extensive ecchymosis RLE Wound: clean and dry  Lab Results: Recent Labs    07/17/18 0759  WBC 14.1*  HGB 7.9*  HCT 23.8*  PLT 339   BMET: No results for input(s): NA, K, CL, CO2, GLUCOSE, BUN, CREATININE, CALCIUM in the last 72 hours.  PT/INR: No results for input(s): LABPROT, INR in the last 72 hours. ABG    Component Value Date/Time   PHART 7.416 07/10/2018 2042   HCO3 22.0 07/10/2018 2042   TCO2 23 07/10/2018 2042   ACIDBASEDEF 2.0 07/10/2018 2042   O2SAT 100.0 07/10/2018 2042   CBG (last 3)  Recent Labs    07/18/18 1208  GLUCAP 100*    Assessment/Plan: S/P Procedure(s) (LRB): CORONARY ARTERY BYPASS GRAFTING (CABG), FREE LIMA (N/A) TRANSESOPHAGEAL ECHOCARDIOGRAM (TEE) (N/A)  1. CV- PAF, has been maintaining NSR on Amiodarone  2. Pulm- no acute issues, continue IS 3. Renal- no acute issues, weight is mildly elevated, but no edema on exam 4. GI- remains constipated, good BS on exam, last BM on 5/24, will try enema today 5. Deconditioning- for SNF at [facility] today 6. dispo- patient stable, constipation, good BS on exam, no distention, will try enema today, she did move her bowels on 5/24, d/c to [facility] today   LOS: 11 days    Lowella Dandy 07/19/2018

## 2018-07-19 NOTE — Progress Notes (Signed)
Spoke with patient daughter Cordelia Pen. And gave her an update on her Mother.

## 2018-07-23 ENCOUNTER — Telehealth: Payer: Self-pay

## 2018-07-23 NOTE — Telephone Encounter (Signed)
Patient's daughter returned my call and informed me that her mother Leah Pittman is in a nursing rehab facility ([facility]) and that she was not aware of the upcoming appointment but glad that I called. Mrs. Arville Care gave verbal consent for the virtual visit and asked that we call her after the appointment to inform her what was said and any changes (if any) are made.  Called the SNF to make arrangements for the virtual with the patient. The patient's nurse Alvera Novel, LPN gave her mobile number to do the virtual visit with the patient due to the Activities Assistant will not be in the building on the day of the scheduled virtual visit.

## 2018-07-23 NOTE — Telephone Encounter (Signed)
telehealth visit has been arranged for the patient follow up

## 2018-07-23 NOTE — Telephone Encounter (Signed)
Created in [employer]

## 2018-07-23 NOTE — Telephone Encounter (Signed)
Left a detailed message for the patient about upcoming appointment to confirm if it will be a virtual visit if so to go over how the virtual appointment will occur. Let the patient know to call back and ask for me.

## 2018-07-24 ENCOUNTER — Other Ambulatory Visit: Payer: Self-pay | Admitting: *Deleted

## 2018-07-24 NOTE — Patient Outreach (Signed)
Member assessed for potential [facility] Care Management needs as a benefit of her [employer] Medicare insurance.  Member is currently at [facility] receiving rehab therapy.  Facility staff reports member lives with her husband who recently finished chemotherapy treatment. Facility staff report member has a daughter who is supportive.  Disposition plans are to return home.  Member with recent hospitalization s/p CABG and medical history of HTN, HLD.  Will continue follow and engage for [facility] Care Management program closer to SNF discharge.  Will continue to collaborate with facility staff and [facility] UM team on member.   Raiford Noble, MSN-Ed, RN,BSN [facility] Post Acute Care Coordinator [phone]

## 2018-07-26 ENCOUNTER — Telehealth: Payer: Self-pay | Admitting: Physician Assistant

## 2018-07-26 ENCOUNTER — Telehealth (INDEPENDENT_AMBULATORY_CARE_PROVIDER_SITE_OTHER): Payer: Medicare Other | Admitting: Physician Assistant

## 2018-07-26 VITALS — BP 121/61 | HR 74 | Resp 18 | Ht 64.0 in | Wt 122.0 lb

## 2018-07-26 DIAGNOSIS — I25810 Atherosclerosis of coronary artery bypass graft(s) without angina pectoris: Secondary | ICD-10-CM | POA: Diagnosis not present

## 2018-07-26 DIAGNOSIS — E785 Hyperlipidemia, unspecified: Secondary | ICD-10-CM

## 2018-07-26 DIAGNOSIS — D508 Other iron deficiency anemias: Secondary | ICD-10-CM

## 2018-07-26 DIAGNOSIS — E039 Hypothyroidism, unspecified: Secondary | ICD-10-CM

## 2018-07-26 DIAGNOSIS — I480 Paroxysmal atrial fibrillation: Secondary | ICD-10-CM

## 2018-07-26 DIAGNOSIS — I10 Essential (primary) hypertension: Secondary | ICD-10-CM

## 2018-07-26 NOTE — Telephone Encounter (Signed)
New message:   Patient returning call back concering appt this morning. Please call back [phone]

## 2018-07-26 NOTE — Progress Notes (Signed)
Virtual Visit via Video Note   This visit type was conducted due to national recommendations for restrictions regarding the COVID-19 Pandemic (e.g. social distancing) in an effort to limit this patient's exposure and mitigate transmission in our community.  Due to her co-morbid illnesses, this patient is at least at moderate risk for complications without adequate follow up.  This format is felt to be most appropriate for this patient at this time.  All issues noted in this document were discussed and addressed.  A limited physical exam was performed with this format.  Please refer to the patient's chart for her consent to telehealth for [facility].   Date:  07/28/2018   ID:  Leah Pittman, DOB 1930-08-22, MRN [id number]  Patient Location: Home Provider Location: Home  PCP:  Blane Ohara, MD  Cardiologist:  Thurmon Fair, MD  Electrophysiologist:  None   Evaluation Performed:  Follow-Up Visit  Chief Complaint:  followup  History of Present Illness:    Leah Pittman is a 85 y.o. female with past medical history of hypertension, hyperlipidemia, CAD s/p recent CABG and post op afib.  Patient complained of chest pain during the office visit in January 2020, a coronary CT was arranged which showed proximal LAD disease however cath was deferred due to COVID-19 concern.  Due to recurrent chest pain, patient was directly set up for cardiac catheterization on 07/08/2018 which showed 40% proximal to mid RCA lesion, 75% ostial left main lesion, 95% proximal left circumflex lesion, 90% proximal LAD lesion.  Due to significant left main disease along with two-vessel disease, she was referred to cardiothoracic surgery for consideration of bypass surgery.  Echocardiogram obtained on the same day showed EF 60 to 65%, mild LVH, grade 1 DD.  She ultimately underwent CABG x4 with LIMA to LAD, SVG to diagonal and sequential SVG to OM1 and OM 2 by Dr. Evelene Croon.  Postop course was complicated by atrial  fibrillation with RVR.  She was placed on amiodarone therapy which helped her to convert to sinus rhythm.  The plan is to continue amiodarone for only 30 days postop.  Her simvastatin was switched to Crestor during this admission as well.  Postop volume overload responded well to diuretic.  Patient was contacted today via telehealth video conference visit.  Her TSH was high recently at 9.63, she was placed on higher dose of levothyroxine.  She is aware that she will continue the amiodarone until June 29 then stop after that.  Her blood pressure has been low range between low 90s to 120s, I will decrease lisinopril to 2.5 mg daily and to continue her on the current dose of metoprolol.  Her renal function and potassium are normal based on the recent lab work obtained at the nursing home.  She is still troubled by insomnia after the recent surgery which is expected.  Although she is still feeling weak, however according to nursing home staff, she is doing very well with physical therapy and able to do everything they offer.  Her last hemoglobin after discharge was 8.7 showing that her hemoglobin is improving after the recent surgery as well.  Overall she is making good recovery slowly but surely.  I will arrange a 2 to 3 weeks in office visit.  The patient does not have symptoms concerning for COVID-19 infection (fever, chills, cough, or new shortness of breath).    Past Medical History:  Diagnosis Date   Anxiety    Arthritis    Chest  pain 2011   CARDIOLITE - no significant symptoms, EKG changes, or arrhythmias   Dyspnea 02/16/2012   2D ECHO - EF 55-60%, normal   Fatigue    Glaucoma    Headache(784.0)    Hiatal hernia    High blood pressure 02/16/2012   RENAL DOPPLER - normal   Hyperlipidemia    Hypothyroidism    Labile hypertension    Lightheadedness    Migraine headache    Multiple allergies    Myalgia    Pain, lower leg    Calf   Problem of menstruation    Reflux      SOB (shortness of breath)    Thyroid disease    Urinary problem    Past Surgical History:  Procedure Laterality Date   ABDOMINAL HYSTERECTOMY  1976   CHOLECYSTECTOMY  2000   CORONARY ARTERY BYPASS GRAFT N/A 07/10/2018   Procedure: CORONARY ARTERY BYPASS GRAFTING (CABG), FREE LIMA;  Surgeon: [name], MD;  Location: [facility] OR;  Service: Open Heart Surgery;  Laterality: N/A;   CYSTECTOMY  1974   Intestine   CYSTECTOMY  1996   Brain stem   EYE SURGERY  2003   LEFT HEART CATH AND CORONARY ANGIOGRAPHY N/A 07/08/2018   Procedure: LEFT HEART CATH AND CORONARY ANGIOGRAPHY;  Surgeon: Runell Gess, MD;  Location: [facility] INVASIVE CV LAB;  Service: Cardiovascular;  Laterality: N/A;   TEE WITHOUT CARDIOVERSION N/A 07/10/2018   Procedure: TRANSESOPHAGEAL ECHOCARDIOGRAM (TEE);  Surgeon: [name], MD;  Location: [facility] OR;  Service: Open Heart Surgery;  Laterality: N/A;     Current Meds  Medication Sig   acetaminophen (TYLENOL) 325 MG tablet Take 975 mg by mouth every 8 (eight) hours as needed for mild pain or headache.    alum & mag hydroxide-simeth (MAALOX/MYLANTA) 200-200-20 MG/5ML suspension Take 30 mLs by mouth every 6 (six) hours as needed for indigestion or heartburn.   amiodarone (PACERONE) 200 MG tablet Take 1 tablet (200 mg total) by mouth daily.   aspirin 81 MG tablet Take 81 mg by mouth daily.   bisacodyl (DULCOLAX) 5 MG EC tablet Take 10 mg by mouth daily as needed for moderate constipation. Give 2 tablets by mouth daily for constipation   calcium-vitamin D (OSCAL 500/200 D-3) 500-200 MG-UNIT tablet Take 1 tablet by mouth 2 (two) times daily. 1 tablet by mouth 2 times a day   Cholecalciferol 25 MCG (1000 UT) tablet Take 4,000 Units by mouth daily. Give 4 tablets po daily   docusate sodium (COLACE) 100 MG capsule Take 100 mg by mouth daily as needed for mild constipation.   dorzolamide-timolol (COSOPT) 22.3-6.8 MG/ML ophthalmic solution Place 1 drop into both  eyes 2 (two) times daily.    feeding supplement, ENSURE ENLIVE, (ENSURE ENLIVE) LIQD Take 237 mLs by mouth 3 (three) times daily with meals.   lisinopril (ZESTRIL) 5 MG tablet Take 1 tablet (5 mg total) by mouth daily.   LORazepam (ATIVAN) 1 MG tablet Take 1 mg by mouth at bedtime.    magnesium oxide (MAG-OX) 400 MG tablet Take 400 mg by mouth daily.   Melatonin 3 MG TABS Take 6 mg by mouth at bedtime as needed. Give 2 tablets by mouth at bedtime for insomnia   metoprolol tartrate (LOPRESSOR) 25 MG tablet Take 0.5 tablets (12.5 mg total) by mouth 2 (two) times daily.   MULTIPLE VITAMINS-MINERALS PO Take 1 tablet by mouth. 1 tablet by mouth daily   nitroGLYCERIN (NITROSTAT) 0.4 MG SL tablet  Place 0.4 mg under the tongue every 5 (five) minutes as needed for chest pain.    ondansetron (ZOFRAN) 8 MG tablet Take 8 mg by mouth 2 (two) times daily as needed for nausea or vomiting.   oxyCODONE (OXY IR/ROXICODONE) 5 MG immediate release tablet Take 2.5 mg by mouth every 4 (four) hours as needed for severe pain. Take half tablet by mouth every 4 hours as needed for pain   pantoprazole (PROTONIX) 40 MG tablet Take 40 mg by mouth 2 (two) times a day.    polyethylene glycol powder (GLYCOLAX/MIRALAX) 17 GM/SCOOP powder Take by mouth once. Give 17 grams by mouth daily   senna (SENOKOT) 8.6 MG TABS tablet Take 1 tablet by mouth. 1 tablet by mouth every 24 hours as needed for constipation   simvastatin (ZOCOR) 20 MG tablet Take 20 mg by mouth daily with supper.    SYNTHROID 88 MCG tablet Take 88 mcg by mouth daily before breakfast.   Travoprost, BAK Free, (TRAVATAN Z) 0.004 % SOLN ophthalmic solution Place 1 drop into both eyes at bedtime.    Vitamin D, Ergocalciferol, (DRISDOL) 1.25 MG (50000 UT) CAPS capsule Take 50,000 Units by mouth every Monday.      Allergies:   Benadryl [diphenhydramine]; Codeine; Meperidine; Prednisone; Promethazine; Propranolol; Shellfish allergy; Tape; Tazarotene; and  Iodinated diagnostic agents   Social History   Tobacco Use   Smoking status: Never Smoker   Smokeless tobacco: Never Used  Substance Use Topics   Alcohol use: No   Drug use: No     Family Hx: The patient's family history includes Asthma in her brother; Cancer in her brother, brother, and sister; Heart attack in her father; Stroke in her father.  ROS:   Please see the history of present illness.     All other systems reviewed and are negative.   Prior CV studies:   The following studies were reviewed today:  Cath 07/08/2018  Prox RCA to Mid RCA lesion is 40% stenosed.  Ost LM lesion is 75% stenosed.  Mid LM lesion is 40% stenosed.  Ost Cx to Prox Cx lesion is 50% stenosed.  Prox Cx to Mid Cx lesion is 95% stenosed.  Prox LAD to Mid LAD lesion is 90% stenosed.  The left ventricular systolic function is normal.  LV end diastolic pressure is normal.  The left ventricular ejection fraction is greater than 65% by visual estimate.   Echo 07/08/2018 IMPRESSIONS    1. The left ventricle has normal systolic function with an ejection fraction of 60-65%. The cavity size was normal. There is mildly increased left ventricular wall thickness. Left ventricular diastolic Doppler parameters are consistent with impaired  relaxation. Indeterminate filling pressures The E/e' is 8-15. No evidence of left ventricular regional wall motion abnormalities.  2. The right ventricle has normal systolic function. The cavity was normal. There is no increase in right ventricular wall thickness.  3. The tricuspid valve is grossly normal.  4. The aortic valve is grossly normal. No stenosis of the aortic valve.   Labs/Other Tests and Data Reviewed:    EKG:  An ECG dated 07/11/2018 was personally reviewed today and demonstrated:  Normal sinus rhythm with no significant ST-T wave changes.  Recent Labs: 05/10/2018: B Natriuretic Peptide 65.4 07/11/2018: Magnesium 2.4 07/15/2018: ALT  54 07/16/2018: BUN 11; Creatinine, Ser 0.66; Potassium 3.7; Sodium 137 07/17/2018: Hemoglobin 7.9; Platelets 339   Recent Lipid Panel Lab Results  Component Value Date/Time   CHOL 163 07/09/2018 05:23 AM  TRIG 77 07/09/2018 05:23 AM   HDL 57 07/09/2018 05:23 AM   CHOLHDL 2.9 07/09/2018 05:23 AM   LDLCALC 91 07/09/2018 05:23 AM    Wt Readings from Last 3 Encounters:  07/26/18 122 lb (55.3 kg)  07/19/18 120 lb 1.6 oz (54.5 kg)  06/25/18 114 lb 8 oz (51.9 kg)     Objective:    Vital Signs:  BP 121/61    Pulse 74    Resp 18    Ht  (1.626 m)    Wt 122 lb (55.3 kg)    SpO2 98%    BMI 20.94 kg/m    VITAL SIGNS:  reviewed  ASSESSMENT & PLAN:    1. CAD s/p CABG: She does have some chest soreness, however incision appears to be healing very well without significant drainage or redness spreading out.  She was discharged on low-dose aspirin, you however even on the low-dose aspirin, she has quite a bit of bruising in her chest.  2. Postop atrial fibrillation: She will is on a temporary course of amiodarone.  Amiodarone therapy will be stopped after June 29  3. Postop anemia: Hemoglobin improving after discharge based on lab work from outside facility  4. Hypertension: Blood pressure stable today on current therapy however it has been low previously between the 80s and 120s.  I decreased amlodipine to 2.5 mg daily.  5. Hyperlipidemia: Continue high-dose statin  6. Hypothyroidism: Managed by primary care provider   COVID-19 Education: The signs and symptoms of COVID-19 were discussed with the patient and how to seek care for testing (follow up with PCP or arrange E-visit).  The importance of social distancing was discussed today.  Time:   Today, I have spent 19 minutes with the patient with telehealth technology discussing the above problems.     Medication Adjustments/Labs and Tests Ordered: Current medicines are reviewed at length with the patient today.  Concerns regarding  medicines are outlined above.   Tests Ordered: No orders of the defined types were placed in this encounter.   Medication Changes: No orders of the defined types were placed in this encounter.   Disposition:  Follow up in 3 week(s)  Signed, Azalee Course, PA  07/28/2018 6:55 PM    [facility]

## 2018-07-26 NOTE — Patient Instructions (Signed)
Medication Instructions:   STOP Amiodarone on August 19, 2018 Decrease Lisinopril to 2.5 mg daily  If you need a refill on your cardiac medications before your next appointment, please call your pharmacy.   Lab work:  NONE ordered at this time of appointment   If you have labs (blood work) drawn today and your tests are completely normal, you will receive your results only by: Marland Kitchen MyChart Message (if you have MyChart) OR . A paper copy in the mail If you have any lab test that is abnormal or we need to change your treatment, we will call you to review the results.  Testing/Procedures:  NONE ordered at this time of appointment   Follow-Up: At [facility], you and your health needs are our priority.  As part of our continuing mission to provide you with exceptional heart care, we have created designated Provider Care Teams.  These Care Teams include your primary Cardiologist (physician) and Advanced Practice Providers (APPs -  Physician Assistants and Nurse Practitioners) who all work together to provide you with the care you need, when you need it. You will need a follow up appointment in 2-3 weeks.  Please call our office 2 months in advance to schedule this appointment.  You may see Thurmon Fair, MD or one of the following Advanced Practice Providers on your designated Care Team: [place], [place] . Micah Flesher, PA-C  Any Other Special Instructions Will Be Listed Below (If Applicable).

## 2018-07-29 ENCOUNTER — Telehealth: Payer: Self-pay | Admitting: Cardiovascular Disease

## 2018-07-29 NOTE — Telephone Encounter (Signed)
Left message to call back  

## 2018-07-29 NOTE — Telephone Encounter (Signed)
New Message            Debbie with [facility] transportation is calling to check to see if the patient needs to come in or not.Patient has an appointment on 08/21/18 not sure if this should be in house or not. Pls call to advise.[phone].

## 2018-07-30 NOTE — Telephone Encounter (Signed)
Spoke with debbie, virtual visit has been set up.

## 2018-07-31 ENCOUNTER — Other Ambulatory Visit: Payer: Self-pay | Admitting: *Deleted

## 2018-07-31 NOTE — Patient Outreach (Signed)
Member discussed in telephonic IDT meeting with [facility] staff and [place] staff reports member's disposition plan is for Leah Pittman to return home with husband and home health services. Facility staff reports member could benefit from [place] Management services post SNF discharge. Advised that writer should contact member's husband at [phone].   Leah Pittman has medical history of HTN, HLD, CAD, AFIB.  Telephone call made to member's husband at [phone]. No answer. Left voicemail message to request call back.  Will attempt outreach Leah Pittman again at later time.  Will continue to collaborate with facility staff and [facility] RN on member.    Marthenia Rolling, MSN-Ed, RN,BSN [place] Acute Care Coordinator [phone]

## 2018-08-07 ENCOUNTER — Other Ambulatory Visit: Payer: Self-pay | Admitting: *Deleted

## 2018-08-07 NOTE — Patient Outreach (Signed)
Member discussed in telephonic IDT meeting with [facility] staff and [facility] RN today.   Discharge planner reports member will discharge home on this Saturday with her husband and supportive daughter's assistance. Writer made facility aware that  unsuccessful outreach attempt was made to member's husband last week.   SNF discharge planner advises writer to contact member's daughter, [name], at [phone] to discuss [place] Management.  Telephone call made to [name] (daughter) at [phone] to discuss [place] Management. Patient identifiers confirmed.   Discussed [facility] Care Management program. [name] is agreeable and states she should be contacted for post [facility] calls at [phone]. States Mr. Mortellaro (member's husband) is very hard of hearing and that he will not comprehend much. Also states Mr. Mcconahy just finished his last round of chemotherapy 2 weeks ago and he himself is very weak and fatigued.   Sherrie states she will stay with member for one week post SNF discharge.   Explained that [place] Management will not interfere or replace home health. Sherrie is appreciative of the additional layer of support [facility] can provide. Sherrie states she is not exactly sure what her mother's needs will be post SNF discharge.  Sherrie confirms Primary Care MD is Dr. Rochel Brome. Right now Sherrie is not aware of any medication concerns. However, she may benefit from an eventual social worker referral in the future for potential long term planning navigation.   Sherrie also confirms that her mother will discharge from SNF this Saturday.   Discussed Probation officer will make a referral for [place] for follow up post SNF. Also explained that interaction will be telephonic due to the current pandemic. Sherrie expressed understanding.  Mrs. Hodgkiss has a medical history per chart of CAD, CABG, AFIB, HTN, HLD.   Will plan to make [place] referral closer to SNF  discharge.    Marthenia Rolling, MSN-Ed, RN,BSN [place] Acute Care Coordinator [phone]

## 2018-08-12 ENCOUNTER — Other Ambulatory Visit: Payer: Self-pay | Admitting: *Deleted

## 2018-08-12 DIAGNOSIS — I10 Essential (primary) hypertension: Secondary | ICD-10-CM

## 2018-08-12 NOTE — Patient Outreach (Signed)
Confirmed in PatientPing that member discharged home on Saturday, 08/10/18.   Will make referral to [facility] for complex case management.  Please see writer's notes from 08/07/18 for further details. Daughter [name] (daughter) at [phone] is primary contact.   Marthenia Rolling, MSN-Ed, RN,BSN [place] Acute Care Coordinator [phone] [facility]) [phone]  (Toll free office)

## 2018-08-12 NOTE — Patient Outreach (Addendum)
[place] [facility]) Care Management  08/12/2018  Leah Pittman September 27, 1930 [id number]   Transition of care call   Referral received : 6/22 Referral source : [facility] post acute care coordinator  Referral reason : DC from [facility] on 6/20 , for complex case management .  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   Outreach call to patient primary contact Leah Pittman ( daughter ) no answer able to leave a HIPAA compliant telephone message for return call.   Received return call from patient daughter Leah Pittman, explained reason for the call and [facility] care management services.  Conditions  Patient daughter discussed patient doing fairly well she reports patient tiring out easily. She reports patient not sleeping good but that was a problem she was having prior to surgery. Patient using a walker at home.  She discussed patient chronic problem with constipation, patient had BM on the last yesterday and using miralax and stool softener as needed.  Daughter reports patient pain is control and only using tylenol as needed. Reports one small incision on left leg being tender , but no redness or swelling. Patient denies shortness of breath .  Discussed with daughter patient having scales for weighing on daily, she confirmed   Social Daughter reports that she will be staying with patient on this week but she will return to work on next week. She discussed concern regarding patient spouse will not be able to help patient as he is undergoing chemotherapy and just completed his  last dose. Daughter states she will be able to check on them after work she gets home by 6:30 pm and will help with patient bath or meals at that time.Daughter discussed patient managed well in home prior to surgery with some limitation in vision due to Glaucoma.  Discussed possibility of meals on wheels,she request to hold off on that for now. She discussed not having any  other family available to assist patient during the day,stating that her mother will needed continual care . Patient will be followed by [facility] home health RN, PT/OT services and bath aide to be added. Daughter states that if medicare doesn't cover it they will not be able to hire additional services.   Medications Patient was recently discharged from hospital and all medications have been reviewed. Patient daughter organizes medications ,denies cost concerns   Appointments Dr.Bartle on 6/30 Dr.Croitru on 7/1 Daughter able to assist with transportation.   Plan Patient will continue to receive weekly telephone outreaches as part of transition of care and continued assessment of care needs.  Daughter request for next week okay to contact patient husband may answer the phone but he will be able to get the phone to her.   Will place social worker referral due to patient daughter concern for additional personal care need resources for support .  Will send patient welcome letter,packet and [facility] calendar. Will send PCP involvement barrier letter.   [facility] CM Care Plan Problem One     Most Recent Value  Care Plan Problem One  Risk for readmission recent hospitalization rehab stay after CABG   Role Documenting the Problem One  Care Management [place] for Problem One  Active  [facility]N Long Term Goal   Patient will not experience a hospital readmission over the next 60 days   [facility] Long Term Goal Start Date  08/12/18  Interventions for Problem One Long Term Goal  Discussed  recent discharge instructions from [facility] , Advised notifying MD of concerns related to increased pain, shortness of breath, swelling. Discussed signs of infection to notify MD of redness, swelling temperatur elevation over 101   THN CM Short Term Goal #1   Patient will attend all medical appointments over the next 20 days   [facility] CM Short Term Goal #1 Start Date  08/12/18  Interventions for Short Term Goal #1  Discussed  with patient daughter, upcoming medical appointment and importance of follow up after discharge. Verified transporation   THN CM Short Term Goal #2   Patient will be able to report weighing daily and keeping a log over the next 14 days   THN CM Short Term Goal #2 Start Date  08/12/18  Interventions for Short Term Goal #2  Verified patient has scales at home and reviewed ratiional for monitoring weights. Reviewed best time of day to weigh in the morning after to going to the bathroom and before getting dressed. ADvised notifying MD of sudden weight gain of 2 pounds in a day 5 in a week along with symptoms of shortness of breath .   THN CM Short Term Goal #3  Over the next 30 days patient will be able to report increase of strength and evidenced by increase in reported time walking   THN CM Short Term Goal #3 Start Date  08/12/18  Interventions for Short Tern Goal #3  Discussed importance of daily exercise to help with gaining strength and balance , encouraged participation with home therapy. REviewed fall precautions always using her walker.         Leah GaribaldiKimberly Glover, RN, [facility] Care Management,Care Management Coordinator  [phone]- Mobile [phone] Toll Free Main Office

## 2018-08-12 NOTE — Patient Outreach (Signed)
[place] [facility]) Care Management  08/12/2018  ADENIKE SHIDLER Feb 22, 1930 [id number]   EMMI-general discharge RED ON EMMI ALERT Day # 1 Date: Sunday 08/11/18 [place] Reason: Unfilled prescriptions? yes Other questions/problems? Yes Insurance: medicare  [facility] admissions x 1 ED visits x 1 in the last 6 months   With review of Epic it is noted that this patient's daughter was reached by [facility] community RN CM, Doreene Adas, for transition of care services today, 08/12/18, and the red on EMMI alert questions were addressed in [place]  EMMI:   Unfilled prescriptions- Patient was recently discharged from hospital and all medications have been reviewed. Patient daughter organizes medications ,denies cost concerns   Other questions/problems - concern with personal care need resources Referral to [facility]   Plan: [facility] RN CM will close case at this time as patient has been assessed and no needs identified/needs resolved.   Red alert email sent to [place]. Lavina Hamman, RN, BSN, [place] Coordinator Office number [phone] Mobile number [phone]  Main [facility] number [phone] Fax number [phone]

## 2018-08-13 ENCOUNTER — Telehealth: Payer: Self-pay | Admitting: Cardiovascular Disease

## 2018-08-13 NOTE — Telephone Encounter (Signed)
home phone/ declined my chart/ consent/ pre reg completed  °

## 2018-08-19 ENCOUNTER — Other Ambulatory Visit: Payer: Self-pay | Admitting: Surgery

## 2018-08-19 ENCOUNTER — Other Ambulatory Visit: Payer: Self-pay

## 2018-08-19 DIAGNOSIS — Z951 Presence of aortocoronary bypass graft: Secondary | ICD-10-CM

## 2018-08-20 ENCOUNTER — Ambulatory Visit
Admission: RE | Admit: 2018-08-20 | Discharge: 2018-08-20 | Disposition: A | Discharge status: Home / Self Care | Payer: Medicare Other | Source: Ambulatory Visit | Attending: Surgery | Admitting: Surgery

## 2018-08-20 ENCOUNTER — Encounter: Payer: Self-pay | Admitting: Surgery

## 2018-08-20 ENCOUNTER — Ambulatory Visit (INDEPENDENT_AMBULATORY_CARE_PROVIDER_SITE_OTHER): Payer: Self-pay | Admitting: Surgery

## 2018-08-20 VITALS — BP 146/69 | HR 58 | Temp 97.7°F | Resp 16 | Ht 64.0 in | Wt 118.0 lb

## 2018-08-20 DIAGNOSIS — Z951 Presence of aortocoronary bypass graft: Secondary | ICD-10-CM

## 2018-08-20 NOTE — Progress Notes (Signed)
HPI: Patient returns for routine postoperative follow-up having undergone coronary bypass graft surgery x4 on 07/10/2018. The patient's early postoperative recovery while in the hospital was notable for a slow postoperative recovery due to her advanced age and preoperative deconditioning.  She was discharged to a skilled nursing facility for ongoing physical therapy and rehab. Since hospital discharge the patient reports usual improvement is now back at home receiving therapy there.  She denies any chest pain or shortness of breath.  She feels like her strength is improving.  Her only complaint is of some numbness in the ulnar aspect of her right hand which is likely due to stretching of the brachial plexus.  This is improving.  She also has had some pain in the ulnar aspect of the left forearm which may also be related to stretching of the brachial plexus but it is improving..   Current Outpatient Medications  Medication Sig Dispense Refill  . acetaminophen (TYLENOL) 325 MG tablet Take 975 mg by mouth every 8 (eight) hours as needed for mild pain or headache.     Marland Kitchen alum & mag hydroxide-simeth (MAALOX/MYLANTA) 200-200-20 MG/5ML suspension Take 30 mLs by mouth every 6 (six) hours as needed for indigestion or heartburn.    Marland Kitchen aspirin 81 MG tablet Take 81 mg by mouth daily.    . bisacodyl (DULCOLAX) 5 MG EC tablet Take 10 mg by mouth daily as needed for moderate constipation. Give 2 tablets by mouth daily for constipation    . Calcium Citrate-Vitamin D (CALCIUM CITRATE+D3 PETITES PO) Take 1 tablet by mouth daily.    . calcium-vitamin D (OSCAL 500/200 D-3) 500-200 MG-UNIT tablet Take 1 tablet by mouth 2 (two) times daily. 1 tablet by mouth 2 times a day    . carboxymethylcellulose (REFRESH TEARS) 0.5 % SOLN Place 1-2 drops into both eyes as needed (for dryness or redness).    . Cholecalciferol 25 MCG (1000 UT) tablet Take 4,000 Units by mouth daily. Give 4 tablets po daily    . docusate sodium  (COLACE) 100 MG capsule Take 100 mg by mouth daily as needed for mild constipation.    . dorzolamide-timolol (COSOPT) 22.3-6.8 MG/ML ophthalmic solution Place 1 drop into the left eye 2 (two) times daily.     . feeding supplement (BOOST HIGH PROTEIN) LIQD Take 1 Container by mouth 3 (three) times daily between meals.    . ferrous [id number] C-folic acid (TRINSICON / FOLTRIN) capsule Take 1 capsule by mouth 2 (two) times daily after a meal.    . levothyroxine (SYNTHROID) 100 MCG tablet Take 100 mcg by mouth daily before breakfast. TAKE 30 MINUTES PRIOR TO ANY FOOD IN THE AM    . lisinopril (ZESTRIL) 5 MG tablet Take 1 tablet (5 mg total) by mouth daily. (Patient taking differently: Take 2.5 mg by mouth daily. ) 30 tablet 3  . LORazepam (ATIVAN) 1 MG tablet Take 1 mg by mouth at bedtime.     . magnesium oxide (MAG-OX) 400 MG tablet Take 400 mg by mouth daily.    . Melatonin 5 MG TABS Take 10 mg by mouth. TAKE 2 TABS AT BEDTIME    . metoprolol tartrate (LOPRESSOR) 25 MG tablet Take 0.5 tablets (12.5 mg total) by mouth 2 (two) times daily. 30 tablet 3  . MULTIPLE VITAMINS-MINERALS PO Take 1 tablet by mouth. 1 tablet by mouth daily    . nitroGLYCERIN (NITROSTAT) 0.4 MG SL tablet Place 0.4 mg under the tongue every 5 (five)  minutes as needed for chest pain.     Marland Kitchen. ondansetron (ZOFRAN) 8 MG tablet Take 8 mg by mouth 2 (two) times daily as needed for nausea or vomiting.    Marland Kitchen. oxyCODONE (OXY IR/ROXICODONE) 5 MG immediate release tablet Take 2.5 mg by mouth every 4 (four) hours as needed for severe pain. Take half tablet by mouth every 4 hours as needed for pain    . pantoprazole (PROTONIX) 40 MG tablet Take 40 mg by mouth 2 (two) times a day.     . polyethylene glycol powder (GLYCOLAX/MIRALAX) 17 GM/SCOOP powder Take by mouth once. Give 17 grams by mouth daily    . senna (SENOKOT) 8.6 MG TABS tablet Take 1 tablet by mouth. 1 tablet by mouth every 24 hours as needed for constipation    . simvastatin  (ZOCOR) 20 MG tablet Take 20 mg by mouth daily with supper.     . Travoprost, BAK Free, (TRAVATAN Z) 0.004 % SOLN ophthalmic solution Place 1 drop into both eyes at bedtime.     . Vitamin D, Ergocalciferol, (DRISDOL) 1.25 MG (50000 UT) CAPS capsule Take 50,000 Units by mouth every Monday.      No current facility-administered medications for this visit.     Physical Exam: BP (!) 146/69 (BP Location: Right Arm, Patient Position: Sitting, Cuff Size: Normal)   Pulse (!) 58   Temp 97.7 F (36.5 C) Comment: THERMAL  Resp 16   Ht 5\' 4"  (1.626 m)   Wt 53.5 kg   SpO2 97% Comment: RA  BMI 20.25 kg/m  She is an elderly and somewhat frail appearing woman who is in no distress but looks better than she did when she left the hospital. Cardiac exam shows a regular rate and rhythm with normal heart sounds. Lungs are clear. The chest incision is healing well and the sternum is stable. The right leg incision is healing well and there is no lower extremity edema.  Diagnostic Tests:  CLINICAL DATA:  Status post CABG. Cough and shortness of breath with exertion.  EXAM: CHEST - 2 VIEW  COMPARISON:  Jul 15, 2018  FINDINGS: Small right-sided pleural effusion. Mild bibasilar atelectasis. The heart, hila, mediastinum, lungs, and pleura are otherwise normal.  IMPRESSION: Small right-sided pleural effusion.  No other acute abnormalities.   Electronically Signed   By: Gerome Samavid  Williams III M.D   On: 08/20/2018 11:06   Impression:  Overall I think she is making a good recovery following her surgery considering her advanced age and preoperative deconditioning.  I encouraged her to continue eating as well as possible and to continue ambulation and physical therapy.  She has made significant progress since discharge and I would expect her to continue to improve over the next couple months.  I asked her not to lift anything heavier than 10 pounds for 3 months postoperatively.  She does have some  symptoms of brachial plexus stretch in both upper extremities but this is improving and I would expect it to completely resolve over the next several months.  Plan:  She will continue to follow-up with cardiology and her PCP and will return to see me if she has any problems with her incisions.   Alleen BorneBryan K Ernest Orr, MD [facility] [phone]

## 2018-08-20 NOTE — Telephone Encounter (Signed)
I called pt to confirm her appt  With Dr C on 08-21-18.

## 2018-08-21 ENCOUNTER — Telehealth: Payer: Self-pay | Admitting: *Deleted

## 2018-08-21 ENCOUNTER — Encounter: Payer: Self-pay | Admitting: Cardiovascular Disease

## 2018-08-21 ENCOUNTER — Other Ambulatory Visit: Payer: Self-pay | Admitting: Physician Assistant

## 2018-08-21 ENCOUNTER — Telehealth (INDEPENDENT_AMBULATORY_CARE_PROVIDER_SITE_OTHER): Payer: Medicare Other | Admitting: Cardiovascular Disease

## 2018-08-21 ENCOUNTER — Ambulatory Visit: Payer: Medicare Other | Admitting: Surgery

## 2018-08-21 ENCOUNTER — Ambulatory Visit: Payer: Medicare Other | Admitting: *Deleted

## 2018-08-21 VITALS — BP 113/46 | HR 64 | Ht 64.0 in | Wt 118.0 lb

## 2018-08-21 DIAGNOSIS — F419 Anxiety disorder, unspecified: Secondary | ICD-10-CM

## 2018-08-21 DIAGNOSIS — I4891 Unspecified atrial fibrillation: Secondary | ICD-10-CM

## 2018-08-21 DIAGNOSIS — E7800 Pure hypercholesterolemia, unspecified: Secondary | ICD-10-CM

## 2018-08-21 DIAGNOSIS — Z951 Presence of aortocoronary bypass graft: Secondary | ICD-10-CM

## 2018-08-21 DIAGNOSIS — I2510 Atherosclerotic heart disease of native coronary artery without angina pectoris: Secondary | ICD-10-CM | POA: Diagnosis not present

## 2018-08-21 DIAGNOSIS — I10 Essential (primary) hypertension: Secondary | ICD-10-CM | POA: Diagnosis not present

## 2018-08-21 DIAGNOSIS — I9789 Other postprocedural complications and disorders of the circulatory system, not elsewhere classified: Secondary | ICD-10-CM | POA: Diagnosis not present

## 2018-08-21 DIAGNOSIS — R0989 Other specified symptoms and signs involving the circulatory and respiratory systems: Secondary | ICD-10-CM

## 2018-08-21 NOTE — Progress Notes (Signed)
Virtual Visit via Telephone Note   This visit type was conducted due to national recommendations for restrictions regarding the COVID-19 Pandemic (e.g. social distancing) in an effort to limit this patient's exposure and mitigate transmission in our community.  Due to her co-morbid illnesses, this patient is at least at moderate risk for complications without adequate follow up.  This format is felt to be most appropriate for this patient at this time.  The patient did not have access to video technology/had technical difficulties with video requiring transitioning to audio format only (telephone).  All issues noted in this document were discussed and addressed.  No physical exam could be performed with this format.  Please refer to the patient's chart for her  consent to telehealth for [facility].   Date:  08/21/2018   ID:  Leah Pittman, DOB 06/19/1930, MRN [id number]  Patient Location: Home Provider Location: Office  PCP:  Blane Oharaox, [name], MD  Cardiologist:  Thurmon FairMihai Jaiyah Beining, MD  Electrophysiologist:  None   Evaluation Performed:  Follow-Up Visit  Chief Complaint:  F/U CABG  History of Present Illness:    Leah Pittman is a 85 y.o. female with multivessel CAD now roughly 1 month status post four-vessel bypass surgery (07/10/2018, Bartle, LIMA to LAD, SVG to diagonal, sequential SVG to OM1 OM 2), complicated by transient postoperative atrial fibrillation, preserved left ventricular systolic function, mild hypercholesterolemia and labile hypertension.  She went to inpatient rehab for a while after surgery but has now been home for about 10 days.  She is doing quite well although she has some residual dyspnea, cough and mild postsurgical pain.  She continues to receive physical therapy at home and seems to be progressing well.  She saw Dr. Virgel GessBartell yesterday and her chest x-ray showed a small residual right pleural effusion, otherwise normal.  She does not have orthopnea, PND, leg edema,  syncope or dizziness, neurological complaints.  She has occasional isolated palpitations that she only notices when she lies in bed and takes a deep breath.  She never really was aware of atrial fibrillation when she was in the hospital.  She is always had various types of chest discomfort and continues to have a variety of chest pain complaints.  It is kind of difficult to distinguish which 1 may have been angina, but I suspect it is the severe feeling of right-sided "indigestion" that was only relieved with morphine before her bypass surgery.  The patient does not have symptoms concerning for COVID-19 infection (fever, chills, cough, or new shortness of breath).   07/08/2018  Diagnostic Dominance: Right Left Main  Ost LM lesion 75% stenosed  Ost LM lesion is 75% stenosed.  Mid LM lesion 40% stenosed  Mid LM lesion is 40% stenosed.  Left Anterior Descending  Prox LAD to Mid LAD lesion 90% stenosed  Prox LAD to Mid LAD lesion is 90% stenosed.  Left Circumflex  Ost Cx to Prox Cx lesion 50% stenosed  Ost Cx to Prox Cx lesion is 50% stenosed.  Prox Cx to Mid Cx lesion 95% stenosed  Prox Cx to Mid Cx lesion is 95% stenosed.  Right Coronary Artery  Prox RCA to Mid RCA lesion 40% stenosed  Prox RCA to Mid RCA lesion is 40% stenosed.  Intervention  No interventions have been documented. Wall Motion  Resting               Left Heart  Left Ventricle The left ventricular size is normal. The left ventricular  systolic function is normal. LV end diastolic pressure is normal. The left ventricular ejection fraction is greater than 65% by visual estimate. No regional wall motion abnormalities.  Coronary Diagrams  Diagnostic Dominance: Right     Past Medical History:  Diagnosis Date  . Anxiety   . Arthritis   . Chest pain 2011   CARDIOLITE - no significant symptoms, EKG changes, or arrhythmias  . Dyspnea 02/16/2012   2D ECHO - EF 55-60%, normal  . Fatigue   . Glaucoma   .  Headache(784.0)   . Hiatal hernia   . High blood pressure 02/16/2012   RENAL DOPPLER - normal  . Hyperlipidemia   . Hypothyroidism   . Labile hypertension   . Lightheadedness   . Migraine headache   . Multiple allergies   . Myalgia   . Pain, lower leg    Calf  . Problem of menstruation   . Reflux   . SOB (shortness of breath)   . Thyroid disease   . Urinary problem    Past Surgical History:  Procedure Laterality Date  . ABDOMINAL HYSTERECTOMY  1976  . CHOLECYSTECTOMY  2000  . CORONARY ARTERY BYPASS GRAFT N/A 07/10/2018   Procedure: CORONARY ARTERY BYPASS GRAFTING (CABG), FREE LIMA;  Surgeon: Alleen BorneBartle, Bryan K, MD;  Location: [facility] OR;  Service: Open Heart Surgery;  Laterality: N/A;  . CYSTECTOMY  1974   Intestine  . CYSTECTOMY  1996   Brain stem  . EYE SURGERY  2003  . LEFT HEART CATH AND CORONARY ANGIOGRAPHY N/A 07/08/2018   Procedure: LEFT HEART CATH AND CORONARY ANGIOGRAPHY;  Surgeon: Runell GessBerry, Jonathan J, MD;  Location: [facility] INVASIVE CV LAB;  Service: Cardiovascular;  Laterality: N/A;  . TEE WITHOUT CARDIOVERSION N/A 07/10/2018   Procedure: TRANSESOPHAGEAL ECHOCARDIOGRAM (TEE);  Surgeon: Alleen BorneBartle, Bryan K, MD;  [facility];  Service: Open Heart Surgery;  Laterality: N/A;     Current Meds  Medication Sig  . acetaminophen (TYLENOL) 325 MG tablet Take 975 mg by mouth every 8 (eight) hours as needed for mild pain or headache.   Marland Kitchen. alum & mag hydroxide-simeth (MAALOX/MYLANTA) 200-200-20 MG/5ML suspension Take 30 mLs by mouth every 6 (six) hours as needed for indigestion or heartburn.  Marland Kitchen. aspirin 81 MG tablet Take 81 mg by mouth daily.  . bisacodyl (DULCOLAX) 5 MG EC tablet Take 10 mg by mouth daily as needed for moderate constipation. Give 2 tablets by mouth daily for constipation  . Calcium Citrate-Vitamin D (CALCIUM CITRATE+D3 PETITES PO) Take 1 tablet by mouth daily.  . calcium-vitamin D (OSCAL 500/200 D-3) 500-200 MG-UNIT tablet Take 1 tablet by mouth 2 (two) times daily. 1 tablet by  mouth 2 times a day  . carboxymethylcellulose (REFRESH TEARS) 0.5 % SOLN Place 1-2 drops into both eyes as needed (for dryness or redness).  . Cholecalciferol 25 MCG (1000 UT) tablet Take 4,000 Units by mouth daily. Give 4 tablets po daily  . docusate sodium (COLACE) 100 MG capsule Take 100 mg by mouth daily as needed for mild constipation.  . dorzolamide-timolol (COSOPT) 22.3-6.8 MG/ML ophthalmic solution Place 1 drop into the left eye 2 (two) times daily.   . feeding supplement (BOOST HIGH PROTEIN) LIQD Take 1 Container by mouth 3 (three) times daily between meals.  . ferrous fumarate-b12-vitamic C-folic acid (TRINSICON / FOLTRIN) capsule Take 1 capsule by mouth 2 (two) times daily after a meal.  . levothyroxine (SYNTHROID) 100 MCG tablet Take 100 mcg by mouth daily before breakfast. TAKE 30  MINUTES PRIOR TO ANY FOOD IN THE AM  . lisinopril (ZESTRIL) 5 MG tablet Take 1 tablet (5 mg total) by mouth daily. (Patient taking differently: Take 2.5 mg by mouth daily. )  . LORazepam (ATIVAN) 1 MG tablet Take 1 mg by mouth at bedtime.   . magnesium oxide (MAG-OX) 400 MG tablet Take 400 mg by mouth daily.  . Melatonin 5 MG TABS Take 10 mg by mouth. TAKE 2 TABS AT BEDTIME  . MULTIPLE VITAMINS-MINERALS PO Take 1 tablet by mouth. 1 tablet by mouth daily  . nitroGLYCERIN (NITROSTAT) 0.4 MG SL tablet Place 0.4 mg under the tongue every 5 (five) minutes as needed for chest pain.   Marland Kitchen ondansetron (ZOFRAN) 8 MG tablet Take 8 mg by mouth 2 (two) times daily as needed for nausea or vomiting.  . pantoprazole (PROTONIX) 40 MG tablet Take 40 mg by mouth 2 (two) times a day.   . polyethylene glycol powder (GLYCOLAX/MIRALAX) 17 GM/SCOOP powder Take by mouth once. Give 17 grams by mouth daily  . senna (SENOKOT) 8.6 MG TABS tablet Take 1 tablet by mouth. 1 tablet by mouth every 24 hours as needed for constipation  . simvastatin (ZOCOR) 20 MG tablet Take 20 mg by mouth daily with supper.   . Travoprost, BAK Free, (TRAVATAN  Z) 0.004 % SOLN ophthalmic solution Place 1 drop into both eyes at bedtime.   . Vitamin D, Ergocalciferol, (DRISDOL) 1.25 MG (50000 UT) CAPS capsule Take 50,000 Units by mouth every Monday.      Allergies:   Benadryl [diphenhydramine], Codeine, Meperidine, Prednisone, Promethazine, Propranolol, Shellfish allergy, Tape, Tazarotene, and Iodinated diagnostic agents   Social History   Tobacco Use  . Smoking status: Never Smoker  . Smokeless tobacco: Never Used  Substance Use Topics  . Alcohol use: No  . Drug use: No     Family Hx: The patient's family history includes Asthma in her brother; Cancer in her brother, brother, and sister; Heart attack in her father; Stroke in her father.  ROS:   Please see the history of present illness.     All other systems reviewed and are negative.   Prior CV studies:   The following studies were reviewed today: Notes from Dr. Cyndia Bent Chest x-ray 08/19/2018  Labs/Other Tests and Data Reviewed:    EKG:  An ECG dated 07/11/2018 was personally reviewed today and demonstrated:  Sinus rhythm with nonspecific T wave changes  Recent Labs: 05/10/2018: B Natriuretic Peptide 65.4 07/11/2018: Magnesium 2.4 07/15/2018: ALT 54 07/16/2018: BUN 11; Creatinine, Ser 0.66; Potassium 3.7; Sodium 137 07/17/2018: Hemoglobin 7.9; Platelets 339   Recent Lipid Panel Lab Results  Component Value Date/Time   CHOL 163 07/09/2018 05:23 AM   TRIG 77 07/09/2018 05:23 AM   HDL 57 07/09/2018 05:23 AM   CHOLHDL 2.9 07/09/2018 05:23 AM   LDLCALC 91 07/09/2018 05:23 AM    Wt Readings from Last 3 Encounters:  08/21/18 118 lb (53.5 kg)  08/20/18 118 lb (53.5 kg)  07/26/18 122 lb (55.3 kg)     Objective:    Vital Signs:  BP (!) 113/46   Pulse 64   Ht 5\' 4"  (1.626 m)   Wt 118 lb (53.5 kg)   BMI 20.25 kg/m    VITAL SIGNS:  reviewed Unable to examine  ASSESSMENT & PLAN:    1. CAD s/p CABG: Seems to be recovering well, slight delay expected for an octogenarian, but  faster than I thought she would.  Continue physical therapy.  It does not sound like she is experiencing any angina at this point, but she continues to have a variety of different types of chest complaints. 2. HLP: Preoperative LDL cholesterol 91.  Target LDL less than 70.  Recheck lipids in a couple of months, once the cholesterol has had a chance to rebound after surgery.  3. HTN: Her blood pressure has always been highly variable, but is currently very well controlled.  COVID-19 Education: The signs and symptoms of COVID-19 were discussed with the patient and how to seek care for testing (follow up with PCP or arrange E-visit).  The importance of social distancing was discussed today.  Time:   Today, I have spent 19 minutes with the patient with telehealth technology discussing the above problems.    Patient Instructions  Medication Instructions:  No changes If you need a refill on your cardiac medications before your next appointment, please call your pharmacy.   Lab work: Your provider would like for you to return in 2 months to have the following labs drawn: FASTING Lipid. You may do this in [place] if that is easier for you.  If you have labs (blood work) drawn today and your tests are completely normal, you will receive your results only by: Marland Kitchen. MyChart Message (if you have MyChart) OR . A paper copy in the mail If you have any lab test that is abnormal or we need to change your treatment, we will call you to review the results.  Testing/Procedures: None ordered  Follow-Up: At [facility], you and your health needs are our priority.  As part of our continuing mission to provide you with exceptional heart care, we have created designated Provider Care Teams.  These Care Teams include your primary Cardiologist (physician) and Advanced Practice Providers (APPs -  Physician Assistants and Nurse Practitioners) who all work together to provide you with the care you need, when you need  it. You will need a follow up appointment in 3 months.  Please call our office 2 months in advance to schedule this appointment.  You may see Thurmon FairMihai Delford Wingert, MD or one of the following Advanced Practice Providers on your designated Care Team: [place] Meng, [place]PA-C . Micah FlesherAngela Duke, PA-C      Medication Adjustments/Labs and Tests Ordered: Current medicines are reviewed at length with the patient today.  Concerns regarding medicines are outlined above.   Tests Ordered: No orders of the defined types were placed in this encounter.   Medication Changes: No orders of the defined types were placed in this encounter.   Follow Up:  Virtual Visit or In Person 3 months  Signed, Thurmon FairMihai Kmya Placide, MD  08/21/2018 9:34 AM    [facility]

## 2018-08-21 NOTE — Telephone Encounter (Signed)
The patient has been called and made aware of virtual visit instructions today. The AVS will be mailed to her.

## 2018-08-21 NOTE — Telephone Encounter (Signed)
Open n error °

## 2018-08-21 NOTE — Patient Instructions (Signed)
Medication Instructions:  No changes If you need a refill on your cardiac medications before your next appointment, please call your pharmacy.   Lab work: Your provider would like for you to return in 2 months to have the following labs drawn: FASTING Lipid. You may do this in [place] if that is easier for you.  If you have labs (blood work) drawn today and your tests are completely normal, you will receive your results only by: Marland Kitchen MyChart Message (if you have MyChart) OR . A paper copy in the mail If you have any lab test that is abnormal or we need to change your treatment, we will call you to review the results.  Testing/Procedures: None ordered  Follow-Up: At [facility], you and your health needs are our priority.  As part of our continuing mission to provide you with exceptional heart care, we have created designated Provider Care Teams.  These Care Teams include your primary Cardiologist (physician) and Advanced Practice Providers (APPs -  Physician Assistants and Nurse Practitioners) who all work together to provide you with the care you need, when you need it. You will need a follow up appointment in 3 months.  Please call our office 2 months in advance to schedule this appointment.  You may see Sanda Klein, MD or one of the following Advanced Practice Providers on your designated Care Team: [place], [place] . Fabian Sharp, PA-C

## 2018-08-22 ENCOUNTER — Other Ambulatory Visit: Payer: Self-pay | Admitting: *Deleted

## 2018-08-22 NOTE — Patient Outreach (Signed)
Covering for primary RN CM  Tomasa Rand- outreach call to pt for transition of care week 2, no answer to telephone and no option to leave voicemail,  Rn CM mailed unsuccessful outreach letter to pt home.  PLAN Outreach pt in 3-4 business days Continue weekly transition of care  Jacqlyn Larsen [facility], [place] Coordinator [phone]

## 2018-08-26 ENCOUNTER — Ambulatory Visit: Payer: Medicare Other | Admitting: *Deleted

## 2018-08-26 ENCOUNTER — Other Ambulatory Visit: Payer: Self-pay

## 2018-08-27 ENCOUNTER — Telehealth: Payer: Self-pay | Admitting: *Deleted

## 2018-08-27 NOTE — Patient Outreach (Signed)
Transition of care call:  Placed call to contact person, [name], daughter.  Daughter reports that patient is doing ok. Report some abdominal pain and chest pain at the end of last week. Daughter reports patient took tylenol and felt better. Daughter thinks pain was related to an intense PT session. Reports her mother is walking with a walker and abe to walk independently some of the time. Daughter reports patient has therapy at least  2-3 times per week with PT and OT. Reports nurse comes a few times a week. Patient also has a bath aid 2 times per week. Daughter reports she feels like both her parents could use some extra assistance in the home but do not have the financial resources to self pay.   Daughter reports her mother biggest struggle is weakness.   Daughter reports follow up via phone with Dr. Tobie Poet.   Plan: will continue weekly transition of care calls. Will send an in basket to [employer] to update need for caregiver services.  Daughter has agreed to phone call from Education officer, museum. Daughter declines meals on wheels services at this time.  Tomasa Rand, RN, BSN, CEN [facility] [employer] [phone]

## 2018-08-27 NOTE — Patient Outreach (Signed)
[facility]) Care Management  08/27/2018  Leah Pittman [birth year] [id number]   CSW called & spoke with patient's daughter, [name] to discuss community resources for her parents. Patient had discharged from [facility] on 6/20 for rehab afterundergoing CABG. Patient lives at home with her spouse that recently had his last chemo treatment and daughter states hemay not be able to assist patient. Patient even though having Glaucoma was able to manage okay at home prior to surgery. Patient daughter was staying home with patient , but plans to return to work. Patient is followed by [facility] home health including a bath aide. Daughter states being unable to afford to hire additional help with assist in the home, interested in any resources in area. CSW discussed [place] Program which is sliding scale based and also reviewed [place] ([facility]). Daughter states that she is unsure of their income and whether they would qualify for Medicaid and also unsure whether she would be agreeable to go to the [facility]. CSW will have information mailed to patient's daughter to review. CSW will follow-up in 2 weeks to ensure she received information and answer any further questions.    Raynaldo Opitz, LCSW [facility]  Clinical Social Worker cell #: [phone]

## 2018-09-02 ENCOUNTER — Other Ambulatory Visit: Payer: Self-pay

## 2018-09-02 NOTE — Patient Outreach (Addendum)
Transition of care:  Placed call to daughter, Leah Pittman.  Ms. Leah Pittman reports that she works 8-6pm  Monday though Friday.  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  Daughter agreed to follow up call after [place].  PLAN: will call back later tonight to speak with daughter and complete assessments.  09/03/2018  At 6:40pm  Called back to speak with daughter Leah Pittman at   [phone].  Daughter answered and reports she can not talk and would call me tomorrow.  Will await a call back to complete assessments.  Tomasa Rand, RN, BSN, CEN [facility] [employer] [phone]

## 2018-09-04 ENCOUNTER — Ambulatory Visit: Payer: Medicare Other | Admitting: *Deleted

## 2018-09-10 ENCOUNTER — Other Ambulatory Visit: Payer: Self-pay

## 2018-09-10 NOTE — Patient Outreach (Signed)
Transition of care:  11:30  Place call to contact person [name], who answered and reports she is at work and can not talk. Reports her mom has a UTI and will start antibiotics today.  Daughter requested I call the patient at the home number.  Placed call to the patient and a lady answered the phone and reports that patient is getting dressed and to call back later.   PLAN: call patient back on 09/11/2018  Tomasa Rand, RN, BSN, CEN [place] Coordinator [phone]

## 2018-09-11 ENCOUNTER — Ambulatory Visit: Payer: Self-pay | Admitting: *Deleted

## 2018-09-11 NOTE — Patient Outreach (Addendum)
[facility]) Care Management  09/11/2018   Leah Pittman 07/07/1930 [id number] Telephone call:  Subjective:  Spoke with patient via phone and she reports she is doing well. Reports PT just finished her session.   Patient reports her biggest concern is gaining her strength.  Reports she tires easily.  Reports husband just finished Chemo and her children are helping her and providing meals. Patient reports very poor eyesight. Reports glaucoma and that she is legally blind.  Patient is interested in resources to help with her vision.  Patient reports daughter manages her medications and uses a pill planner. Reports medications are very expensive for the first half of the year.  Reviewed with patient [facility] pharmacy services.   Patient is currently using a walker without difficult. Has problems getting into and out of bath tub and daughter assist.  Reports no walk in shower. Patient reports mild chest pain at night and when reaching for something.  Reports weight loss of about 15 pounds since surgery but is currently drinking ensure daily.  States that she has a UTI and started medications yesterday for treatment.  Objective: awake and alert via phone call. Vitals:   09/11/18 1601  Weight: 116 lb (52.6 kg)  Height: 1.626 m ('5\' 4"'$ )     Current Medications:  Current Outpatient Medications  Medication Sig Dispense Refill  . acetaminophen (TYLENOL) 325 MG tablet Take 975 mg by mouth every 8 (eight) hours as needed for mild pain or headache.     Marland Kitchen alum & mag hydroxide-simeth (MAALOX/MYLANTA) 200-200-20 MG/5ML suspension Take 30 mLs by mouth every 6 (six) hours as needed for indigestion or heartburn.    Marland Kitchen aspirin 81 MG tablet Take 81 mg by mouth daily.    . Calcium Citrate-Vitamin D (CALCIUM CITRATE+D3 PETITES PO) Take 1 tablet by mouth daily.    . calcium-vitamin D (OSCAL 500/200 D-3) 500-200 MG-UNIT tablet Take 1 tablet by mouth 2 (two) times daily. 1 tablet by mouth 2 times a day     . carboxymethylcellulose (REFRESH TEARS) 0.5 % SOLN Place 1-2 drops into both eyes as needed (for dryness or redness).    . Cholecalciferol 25 MCG (1000 UT) tablet Take 4,000 Units by mouth daily. Give 4 tablets po daily    . docusate sodium (COLACE) 100 MG capsule Take 100 mg by mouth daily as needed for mild constipation.    . dorzolamide-timolol (COSOPT) 22.3-6.8 MG/ML ophthalmic solution Place 1 drop into the left eye 2 (two) times daily.     . ferrous [id number] C-folic acid (TRINSICON / FOLTRIN) capsule Take 1 capsule by mouth 2 (two) times daily after a meal.    . levothyroxine (SYNTHROID) 100 MCG tablet Take 100 mcg by mouth daily before breakfast. TAKE 30 MINUTES PRIOR TO ANY FOOD IN THE AM    . lisinopril (ZESTRIL) 5 MG tablet Take 1 tablet (5 mg total) by mouth daily. (Patient taking differently: Take 2.5 mg by mouth daily. ) 30 tablet 3  . LORazepam (ATIVAN) 1 MG tablet Take 1 mg by mouth at bedtime.     . magnesium oxide (MAG-OX) 400 MG tablet Take 400 mg by mouth daily.    . Melatonin 5 MG TABS Take 10 mg by mouth. TAKE 2 TABS AT BEDTIME    . nitroGLYCERIN (NITROSTAT) 0.4 MG SL tablet Place 0.4 mg under the tongue every 5 (five) minutes as needed for chest pain.     . pantoprazole (PROTONIX) 40 MG tablet Take 40 mg  by mouth 2 (two) times a day.     . polyethylene glycol powder (GLYCOLAX/MIRALAX) 17 GM/SCOOP powder Take by mouth once. Give 17 grams by mouth daily    . simvastatin (ZOCOR) 20 MG tablet Take 20 mg by mouth daily with supper.     . Travoprost, BAK Free, (TRAVATAN Z) 0.004 % SOLN ophthalmic solution Place 1 drop into both eyes at bedtime.     . Vitamin D, Ergocalciferol, (DRISDOL) 1.25 MG (50000 UT) CAPS capsule Take 50,000 Units by mouth every Monday.     . bisacodyl (DULCOLAX) 5 MG EC tablet Take 10 mg by mouth daily as needed for moderate constipation. Give 2 tablets by mouth daily for constipation    . feeding supplement (BOOST HIGH PROTEIN) LIQD Take 1  Container by mouth 3 (three) times daily between meals.    . MULTIPLE VITAMINS-MINERALS PO Take 1 tablet by mouth. 1 tablet by mouth daily    . ondansetron (ZOFRAN) 8 MG tablet Take 8 mg by mouth 2 (two) times daily as needed for nausea or vomiting.    . senna (SENOKOT) 8.6 MG TABS tablet Take 1 tablet by mouth. 1 tablet by mouth every 24 hours as needed for constipation     No current facility-administered medications for this visit.     Functional Status:  In your present state of health, do you have any difficulty performing the following activities: 09/11/2018 07/08/2018  Hearing? N N  Vision? Y Y  Difficulty concentrating or making decisions? N N  Walking or climbing stairs? Y N  Dressing or bathing? Y N  Comment able to dress herself, has trouble gettin gout of the tub -  Doing errands, shopping? Y N  Preparing Food and eating ? Y -  Comment poor vision -  Using the Toilet? N -  In the past six months, have you accidently leaked urine? N -  Do you have problems with loss of bowel control? N -  Managing your Medications? Y -  Comment daughter puts medications in pill planner -  Managing your Finances? N -  Housekeeping or managing your Housekeeping? Y -  Comment daughter assist -  Some recent data might be hidden    Fall/Depression Screening: Fall Risk  09/11/2018  Falls in the past year? 0   PHQ 2/9 Scores 09/11/2018  PHQ - 2 Score 0    Assessment:  ( 1) problems noted with vision (2) family assisting with needs. (3) recent UTI (4) active with home health PT. Currently using walker. (5) Mild chest pain mostly when laying on left side and reaching for objects  Plan:  (1) in basket message sent to Social worker about eye resources. (2) encouraged patient to let case manager know of any changes that she need more assistance at home. (3) encouraged patient to take her medications as prescribed and call md for changes in condition. (4)encouraged patient to be as active  as possible to help build strength. Reviewed importance of good nutrition and hydration.  (5)  Encouraged patient to call MD for worsening or changes in symptoms. Reviewed that patient has nitro if needed at home and she is aware of how to take medication.   This note sent to MD.  [facility] CM Care Plan Problem One     Most Recent Value  Care Plan Problem One  Risk for readmission recent hospitalization rehab stay after CABG   Role Documenting the Problem One  Care Management [place] for  Problem One  Active  [facility] Long Term Goal   Patient will not experience a hospital readmission over the next 60 days   [facility]N Long Term Goal Start Date  08/12/18  Interventions for Problem One Long Term Goal  Reviewed current condition. Encouraged patient to notify MD for any new or change in symptoms.  [facility] CM Short Term Goal #1   Patient will attend all medical appointments over the next 20 days   [facility] CM Short Term Goal #1 Start Date  08/12/18  [facility] CM Short Term Goal #1 Met Date  08/27/18  [facility] CM Short Term Goal #2   Patient will be able to report weighing daily and keeping a log over the next 14 days   [facility]N CM Short Term Goal #2 Start Date  08/12/18  [facility] CM Short Term Goal #2 Met Date  09/11/18  THN CM Short Term Goal #3  Over the next 30 days patient will be able to report increase of strength and evidenced by increase in reported time walking   THN CM Short Term Goal #3 Start Date  08/12/18  [facility] CM Short Term Goal #3 Met Date  09/11/18      Next follow up planned for 2 week via phone.  Tomasa Rand, RN, BSN, CEN [facility] [employer] [phone]

## 2018-09-17 ENCOUNTER — Other Ambulatory Visit: Payer: Self-pay | Admitting: *Deleted

## 2018-09-17 NOTE — Patient Outreach (Signed)
[place]) Care Management  09/17/2018  Leah Pittman March 04, 1930 [id number]   CSW called patient's daughter, [name] to follow-up on resources mailed to her but she stated that she was at work and unable to talk, requested that [place] call patient's home number to speak with her. CSW attempted to reach patient at home (ph#: [phone]) but it just kept ringing and ringing until it sounded like someone picked up the phone and hung it up. CSW will try again in 3 days.    Raynaldo Opitz, LCSW [facility]  Clinical Social Worker cell #: [phone]

## 2018-09-20 ENCOUNTER — Other Ambulatory Visit: Payer: Medicare Other | Admitting: *Deleted

## 2018-09-20 NOTE — Patient Outreach (Signed)
[facility]) Care Management  09/20/2018  KLOE OATES 1931-02-19 [id number]   CSW called & spoke with patient's daughter, [name] who confirmed that she had received the requested resources in the mail and had looked over them but had not had a chance to talk it over with her mother. Patient's daughter states that she plans to discuss it with her parents soon and will get in touch with the resources as needed. No further CSW needs identified at this time, but CSW encouraged her to call back if any further needs came up.    Raynaldo Opitz, LCSW [facility]  Clinical Social Worker cell #: [phone]

## 2018-09-23 ENCOUNTER — Other Ambulatory Visit: Payer: Self-pay

## 2018-09-23 ENCOUNTER — Ambulatory Visit (INDEPENDENT_AMBULATORY_CARE_PROVIDER_SITE_OTHER): Payer: Medicare Other | Admitting: Cardiovascular Disease

## 2018-09-23 ENCOUNTER — Encounter: Payer: Self-pay | Admitting: Cardiovascular Disease

## 2018-09-23 VITALS — BP 150/71 | HR 61 | Temp 97.0°F | Ht 64.0 in | Wt 117.0 lb

## 2018-09-23 DIAGNOSIS — I2510 Atherosclerotic heart disease of native coronary artery without angina pectoris: Secondary | ICD-10-CM | POA: Diagnosis not present

## 2018-09-23 DIAGNOSIS — E7800 Pure hypercholesterolemia, unspecified: Secondary | ICD-10-CM

## 2018-09-23 DIAGNOSIS — I10 Essential (primary) hypertension: Secondary | ICD-10-CM

## 2018-09-23 NOTE — Progress Notes (Signed)
Cardiology Office Note    Date:  09/23/2018   ID:  Leah Pittman, DOB Nov 21, 1930, MRN [id number]  PCP:  Leah Brome, MD  Cardiologist:   Leah Klein, MD   No chief complaint on file.   History of Present Illness:  Leah Pittman is a 85 y.o. female with HTN and severe hypercholesterolemia, labile HTN and multivessel CAD s/p CABG (07/10/2018, Leah Pittman, LIMA to LAD, SVG to diagonal, sequential SVG to JY7+WG 2), complicated by transient postoperative atrial fibrillation.  She is recovering slowly but surely from surgery.  She is still receiving home physical therapy and Occupational Therapy.  Her biggest problem is getting in and out of a chair without using her hands for support.  This gets her short of breath after she does 9 or 10 consecutive exercises.  She also complains of numbness in the left fourth and fifth digits, suggestive of ulnar neuropathy.  Her husband Leah Pittman) has completed chemotherapy for lymphoma for the time being.  Unfortunately he has had a variety of complications and all kinds of health issues in the last year.  As before surgery, Leah has a variety of chest pain complaints.  In my opinion, all of them are clearly musculoskeletal brought on by turning or twisting in a certain direction or by pushing on the costochondral joints.  Her angina was subxiphoid discomfort which she describes motioning with the palms of both hands over her epigastric area.  She feels cold a lot and has fatigue.  She denies orthopnea or PND but has a dry annoying cough, especially after she does her breathing exercises with physical therapy.  She is taking an ACE inhibitor and a lower dose than in the past (she is been on ACE inhibitor's for several years).  She denies leg edema, syncope, intermittent claudication or other focal neurological complaints except as described above.  For the most part her blood pressure has been very controlled typically in the 110-120/50-60  range.  LDL cholesterol was 142 in June 2019.  LDL was 91 on Jul 09, 2018, before surgery.  She does not have diabetes mellitus but her hemoglobin A1c was 5.8% in May.  Since then, she has lost weight and her BMI is barely 20.  In 2013 renal artery duplex did not show evidence of renal artery stenosis.  Pre-bypass carotid scan showed no evidence of significant stenoses and there was antegrade flow in both vertebral arteries.  She has normal left ventricular systolic function and has diastolic dysfunction which appears appropriate for age.  Past Medical History:  Diagnosis Date   Anxiety    Arthritis    Chest pain 2011   CARDIOLITE - no significant symptoms, EKG changes, or arrhythmias   Dyspnea 02/16/2012   2D ECHO - EF 55-60%, normal   Fatigue    Glaucoma    Headache(784.0)    Hiatal hernia    High blood pressure 02/16/2012   RENAL DOPPLER - normal   Hyperlipidemia    Hypothyroidism    Labile hypertension    Lightheadedness    Migraine headache    Multiple allergies    Myalgia    Pain, lower leg    Calf   Problem of menstruation    Reflux    SOB (shortness of breath)    Thyroid disease    Urinary problem     Past Surgical History:  Procedure Laterality Date   ABDOMINAL HYSTERECTOMY  1976   CHOLECYSTECTOMY  2000   CORONARY ARTERY  BYPASS GRAFT N/A 07/10/2018   Procedure: CORONARY ARTERY BYPASS GRAFTING (CABG), FREE LIMA;  Surgeon: Alleen BorneBartle, Bryan K, MD;  Location: [facility] OR;  Service: Open Heart Surgery;  Laterality: N/A;   CYSTECTOMY  1974   Intestine   CYSTECTOMY  1996   Brain stem   EYE SURGERY  2003   LEFT HEART CATH AND CORONARY ANGIOGRAPHY N/A 07/08/2018   Procedure: LEFT HEART CATH AND CORONARY ANGIOGRAPHY;  Surgeon: Runell GessBerry, Jonathan J, MD;  Location: [facility] INVASIVE CV LAB;  Service: Cardiovascular;  Laterality: N/A;   TEE WITHOUT CARDIOVERSION N/A 07/10/2018   Procedure: TRANSESOPHAGEAL ECHOCARDIOGRAM (TEE);  Surgeon: Alleen BorneBartle, Bryan K, MD;   [facility];  Service: Open Heart Surgery;  Laterality: N/A;    Current Medications: Outpatient Medications Prior to Visit  Medication Sig Dispense Refill   acetaminophen (TYLENOL) 325 MG tablet Take 975 mg by mouth every 8 (eight) hours as needed for mild pain or headache.      alum & mag hydroxide-simeth (MAALOX/MYLANTA) 200-200-20 MG/5ML suspension Take 30 mLs by mouth every 6 (six) hours as needed for indigestion or heartburn.     aspirin 81 MG tablet Take 81 mg by mouth daily.     bisacodyl (DULCOLAX) 5 MG EC tablet Take 10 mg by mouth daily as needed for moderate constipation. Give 2 tablets by mouth daily for constipation     Calcium Citrate-Vitamin D (CALCIUM CITRATE+D3 PETITES PO) Take 1 tablet by mouth daily.     calcium-vitamin D (OSCAL 500/200 D-3) 500-200 MG-UNIT tablet Take 1 tablet by mouth 2 (two) times daily. 1 tablet by mouth 2 times a day     carboxymethylcellulose (REFRESH TEARS) 0.5 % SOLN Place 1-2 drops into both eyes as needed (for dryness or redness).     Cholecalciferol 25 MCG (1000 UT) tablet Take 4,000 Units by mouth daily. Give 4 tablets po daily     docusate sodium (COLACE) 100 MG capsule Take 100 mg by mouth daily as needed for mild constipation.     dorzolamide-timolol (COSOPT) 22.3-6.8 MG/ML ophthalmic solution Place 1 drop into the left eye 2 (two) times daily.      feeding supplement (BOOST HIGH PROTEIN) LIQD Take 1 Container by mouth 3 (three) times daily between meals.     ferrous fumarate-b12-vitamic C-folic acid (TRINSICON / FOLTRIN) capsule Take 1 capsule by mouth 2 (two) times daily after a meal.     levothyroxine (SYNTHROID) 100 MCG tablet Take 100 mcg by mouth daily before breakfast. TAKE 30 MINUTES PRIOR TO ANY FOOD IN THE AM     lisinopril (ZESTRIL) 5 MG tablet Take 1 tablet (5 mg total) by mouth daily. (Patient taking differently: Take 2.5 mg by mouth daily. ) 30 tablet 3   LORazepam (ATIVAN) 1 MG tablet Take 1 mg by mouth at  bedtime.      magnesium oxide (MAG-OX) 400 MG tablet Take 400 mg by mouth daily.     Melatonin 5 MG TABS Take 10 mg by mouth. TAKE 2 TABS AT BEDTIME     MULTIPLE VITAMINS-MINERALS PO Take 1 tablet by mouth. 1 tablet by mouth daily     nitrofurantoin (MACRODANTIN) 100 MG capsule Take 100 mg by mouth 2 (two) times daily.     nitroGLYCERIN (NITROSTAT) 0.4 MG SL tablet Place 0.4 mg under the tongue every 5 (five) minutes as needed for chest pain.      ondansetron (ZOFRAN) 8 MG tablet Take 8 mg by mouth 2 (two) times daily as needed for  nausea or vomiting.     pantoprazole (PROTONIX) 40 MG tablet Take 40 mg by mouth 2 (two) times a day.      polyethylene glycol powder (GLYCOLAX/MIRALAX) 17 GM/SCOOP powder Take by mouth once. Give 17 grams by mouth daily     simvastatin (ZOCOR) 20 MG tablet Take 20 mg by mouth daily with supper.      Travoprost, BAK Free, (TRAVATAN Z) 0.004 % SOLN ophthalmic solution Place 1 drop into both eyes at bedtime.      Vitamin D, Ergocalciferol, (DRISDOL) 1.25 MG (50000 UT) CAPS capsule Take 50,000 Units by mouth every Monday.      senna (SENOKOT) 8.6 MG TABS tablet Take 1 tablet by mouth. 1 tablet by mouth every 24 hours as needed for constipation     No facility-administered medications prior to visit.      Allergies:   Benadryl [diphenhydramine], Codeine, Meperidine, Prednisone, Promethazine, Propranolol, Shellfish allergy, Tape, Tazarotene, and Iodinated diagnostic agents   Social History   Socioeconomic History   Marital status: Married    Spouse name: Not on file   Number of children: 3   Years of education: college   Highest education level: Not on file  Occupational History   Occupation: Retired  Ecologistocial Needs   Financial resource strain: Not on file   Food insecurity    Worry: Not on file    Inability: Not on Occupational hygienistfile   Transportation needs    Medical: Not on file    Non-medical: Not on file  Tobacco Use   Smoking status: Never Smoker    Smokeless tobacco: Never Used  Substance and Sexual Activity   Alcohol use: No   Drug use: No   Sexual activity: Not on file  Lifestyle   Physical activity    Days per week: Not on file    Minutes per session: Not on file   Stress: Not on file  Relationships   Social connections    Talks on phone: Not on file    Gets together: Not on file    Attends religious service: Not on file    Active member of club or organization: Not on file    Attends meetings of clubs or organizations: Not on file    Relationship status: Not on file  Other Topics Concern   Not on file  Social History Narrative   [place]     Family History:  The patient's family history includes Asthma in her brother; Cancer in her brother, brother, and sister; Heart attack in her father; Stroke in her father.   ROS:   Please see the history of present illness.    ROS All other systems reviewed and are negative.   PHYSICAL EXAM:   VS:  BP (!) 150/71    Pulse 61    Temp (!) 97 F (36.1 C)    Ht 5\' 4"  (1.626 m)    Wt 117 lb (53.1 kg)    SpO2 99%    BMI 20.08 kg/m       General: Alert, oriented x3, no distress, lean Head: no evidence of trauma, PERRL, EOMI, no exophtalmos or lid lag, no myxedema, no xanthelasma; normal ears, nose and oropharynx Neck: normal jugular venous pulsations and no hepatojugular reflux; brisk carotid pulses without delay and no carotid bruits Chest: clear to auscultation, no signs of consolidation by percussion or palpation, normal fremitus, symmetrical and full respiratory excursions Cardiovascular: normal position and quality of the apical  impulse, regular rhythm, normal first and second heart sounds, no murmurs, rubs or gallops Abdomen: no tenderness or distention, no masses by palpation, no abnormal pulsatility or arterial bruits, normal bowel sounds, no hepatosplenomegaly Extremities: no clubbing, cyanosis or edema; 2+ radial, ulnar and brachial  pulses bilaterally; 2+ right femoral, posterior tibial and dorsalis pedis pulses; 2+ left femoral, posterior tibial and dorsalis pedis pulses; no subclavian or femoral bruits Neurological: grossly nonfocal Psych: Normal mood and affect    Wt Readings from Last 3 Encounters:  09/23/18 117 lb (53.1 kg)  09/11/18 116 lb (52.6 kg)  08/21/18 118 lb (53.5 kg)      Studies/Labs Reviewed:   EKG:  EKG is not ordered today.  The ekg ordered today is normal, normal sinus rhythm Recent Labs: 03/29/2017 hemoglobin A1c 5.4%, normal LFTs, mean, creatinine 0.76, potassium 4.7 cholesterol 220, HDL 62, LDL 122, triglycerides 79    ASSESSMENT:    1. Coronary artery disease involving native coronary artery of native heart without angina pectoris   2. Hypercholesterolemia   3. Essential hypertension      PLAN:  In order of problems listed above:  1. CAD s/p CABG: Current symptoms different from angina. Recovering well. 2. Postop AFib:  No recurrence since immediate postop period. 3. HTN: Currently off carvedilol with a fair, albeit not perfect blood pressure.  Continue the ACE inhibitor. 4. HLP: Target LDL<70.  Recheck today..  Check CBC and TSH due to fatigue and feeling cold.   Medication Adjustments/Labs and Tests Ordered: Current medicines are reviewed at length with the patient today.  Concerns regarding medicines are outlined above.  Medication changes, Labs and Tests ordered today are listed in the Patient Instructions below. Patient Instructions  Medication Instructions:  Your physician recommends that you continue on your current medications as directed. Please refer to the Current Medication list given to you today.  If you need a refill on your cardiac medications before your next appointment, please call your pharmacy.   Lab work: Your provider would like for you to have the following labs today: Lipid, TSH and CBC  If you have labs (blood work) drawn today and your tests are  completely normal, you will receive your results only by: MyChart Message (if you have MyChart) OR A paper copy in the mail If you have any lab test that is abnormal or we need to change your treatment, we will call you to review the results.  Testing/Procedures: None ordered  Follow-Up: At [facility], you and your health needs are our priority.  As part of our continuing mission to provide you with exceptional heart care, we have created designated Provider Care Teams.  These Care Teams include your primary Cardiologist (physician) and Advanced Practice Providers (APPs -  Physician Assistants and Nurse Practitioners) who all work together to provide you with the care you need, when you need it. You will need a follow up appointment in 6 months.  Please call our office 2 months in advance to schedule this appointment.  You may see Thurmon FairMihai Chelisa Hennen, MD or one of the following Advanced Practice Providers on your designated Care Team: Azalee CourseHao Meng, PA-C Micah FlesherAngela Duke, [place]PA-C           Signed, Thurmon FairMihai Zafirah Vanzee, MD  09/23/2018 4:41 PM    [facility] [address], [place], [place]  [address] Phone: [phone]; Fax: [phone]

## 2018-09-23 NOTE — Patient Instructions (Signed)
Medication Instructions:  Your physician recommends that you continue on your current medications as directed. Please refer to the Current Medication list given to you today.  If you need a refill on your cardiac medications before your next appointment, please call your pharmacy.   Lab work: Your provider would like for you to have the following labs today: Lipid, TSH and CBC  If you have labs (blood work) drawn today and your tests are completely normal, you will receive your results only by: [place] (if you have MyChart) OR A paper copy in the mail If you have any lab test that is abnormal or we need to change your treatment, we will call you to review the results.  Testing/Procedures: None ordered  Follow-Up: At [facility], you and your health needs are our priority.  As part of our continuing mission to provide you with exceptional heart care, we have created designated Provider Care Teams.  These Care Teams include your primary Cardiologist (physician) and Advanced Practice Providers (APPs -  Physician Assistants and Nurse Practitioners) who all work together to provide you with the care you need, when you need it. You will need a follow up appointment in 6 months.  Please call our office 2 months in advance to schedule this appointment.  You may see Sanda Klein, MD or one of the following Advanced Practice Providers on your designated Care Team: Almyra Deforest, PA-C Fabian Sharp, [place]

## 2018-09-24 LAB — LIPID PANEL
Chol/HDL Ratio: 2.7 ratio (ref 0.0–4.4)
Cholesterol, Total: 170 mg/dL (ref 100–199)
HDL: 64 mg/dL (ref >39)
LDL Calculated: 69 mg/dL (ref 0–99)
Triglycerides: 185 mg/dL — ABNORMAL HIGH (ref 0–149)
VLDL Cholesterol Cal: 37 mg/dL (ref 5–40)

## 2018-09-24 LAB — CBC
Hematocrit: 36.7 % (ref 34.0–46.6)
Hemoglobin: 12.2 g/dL (ref 11.1–15.9)
MCH: 31.2 pg (ref 26.6–33.0)
MCHC: 33.2 g/dL (ref 31.5–35.7)
MCV: 94 fL (ref 79–97)
Platelets: 237 10*3/uL (ref 150–450)
RBC: 3.91 x10E6/uL (ref 3.77–5.28)
RDW: 14.2 % (ref 11.7–15.4)
WBC: 7.3 10*3/uL (ref 3.4–10.8)

## 2018-09-24 LAB — TSH: TSH: 0.441 u[IU]/mL — ABNORMAL LOW (ref 0.450–4.500)

## 2018-09-25 ENCOUNTER — Encounter (INDEPENDENT_AMBULATORY_CARE_PROVIDER_SITE_OTHER): Payer: Self-pay

## 2018-09-25 ENCOUNTER — Other Ambulatory Visit: Payer: Self-pay

## 2018-09-25 NOTE — Patient Outreach (Signed)
Telephone assessment:  Placed call to patient for 2 week follow up. No answer. No machine.  PLAN: [facility] will contact patient in 3 day. Unsuccessful outreach letter to be mailed today.  Tomasa Rand, RN, BSN, CEN [facility] [employer] [phone]

## 2018-09-30 ENCOUNTER — Ambulatory Visit: Payer: Self-pay

## 2018-09-30 ENCOUNTER — Other Ambulatory Visit: Payer: Self-pay | Admitting: *Deleted

## 2018-09-30 NOTE — Patient Outreach (Addendum)
[facility]) Care Management  09/30/2018  Leah Pittman 1930/04/04 [id number]   Follow up telephone assessment  Subjective  Successful outreach call to patient, HIPAA confirmed x 2 identifiers .  Patient discussed that she is doing pretty good making slow progress, gradually getting some strength.  She reports still being active with home health PT, RN and OT stating that they extended her time. She discussed using her rolling walker .  Patient discussed that she is still working on drinking ensure about twice daily, discussed trying a different source of supplement such as boost or flavor, she reports that she does not like boost compared to ensure.  Asked patient about her weights, she reports that she is no longer doing that on regular basis , discussed how keeping an eye on her weight can identify her progress with nutrition and monitor for weight loss.     She further discussed:  She discussed that her husband is feeling a little better and she is getting a little stir crazy so they are planning to go for a short drive and pickup some food for lunch, she is looking forward to that. Discussed Covid 19 and precautions she states they that are not planning on getting out of the car,and they have mask to wear if needed.   Patient denies any new concerns at this times   Plan  Will update assigned care coordinator Tomasa Rand for follow up in the next 2 weeks. Reinforced with patient to notify MD of any new concerns , symptom for follow up.     Joylene Draft, RN, [place] Management Coordinator  [phone]- Mobile [phone]- Toll Free Main Office

## 2018-10-03 ENCOUNTER — Telehealth: Payer: Medicare Other | Admitting: Cardiovascular Disease

## 2018-10-03 ENCOUNTER — Ambulatory Visit: Payer: Medicare Other | Admitting: Cardiovascular Disease

## 2018-10-21 ENCOUNTER — Other Ambulatory Visit: Payer: Self-pay

## 2018-10-21 NOTE — Patient Outreach (Signed)
Case closure note:  Placed call to patient who answered and reports she is doing okay.  Reports recent problems with constipation. States she is taking stool softners which do not seem to be helping.  Patient reports she feels like energy level is slowly improving.  Reports she saw primary MD last week.  Reports she continues to have chest pain with lifting or turning over in the bed.  Reports MD told her this was normal.  Reviewed progress and goals achieved and patient is in agreement for case closure today.  Reviewed: treatment for constipation like laxatives, enemas.  Reviewed importance to call MD if no results.  Reviewed chest pain and when to call 911.  Reviewed use of Nitro. Encouraged patient to increase liquids, exercise and fiber to help with constipation.   PLAN: case closure as goals are met. Will send case closure letter to patient and MD. Encouraged patient to call for needs in the future and she voiced understanding.  Tomasa Rand, RN, BSN, CEN [facility] [employer] [phone]

## 2018-12-30 ENCOUNTER — Telehealth: Payer: Self-pay | Admitting: Cardiovascular Disease

## 2018-12-30 NOTE — Telephone Encounter (Signed)
Patient states for the past 2 weeks the dull chest pain has become worse and stronger- she has SOB with activities., the pain does come and go- she does notice it more at night when laying down, states she feels uncomfortable, denies swelling in hands/feet, patient states her BP goes up and down- it has become as high at 180/90's. She normally has issues with it being low, but recently it has been higher.  Patient has not taken any nitroglycerin- she states she has thought about it but has not tried it.   She would like to know if she can be seen sooner- she states if Dr.C can not see her she will see Dr.Berry who according to patient did her surgery.   I advised I would route a message to MD and primary nurse to see if anything sooner- or any other recommendations.  Advised if pain became severe chest pains, increased SOB or anything worsening to go to ER to be evaluated. Patient verbalized understanding.

## 2018-12-30 NOTE — Telephone Encounter (Signed)
Pt c/o of Chest Pain: STAT if CP now or developed within 24 hours  1. Are you having CP right now? No, but she states it is a dull chest pain.  2. Are you experiencing any other symptoms (ex. SOB, nausea, vomiting, sweating)? SOB when she does anything  3. How long have you been experiencing CP? About a month  4. Is your CP continuous or coming and going? Coming and going  5. Have you taken Nitroglycerin? no   She is 6 months post bypass surgery, she has an appt with Dr. Sallyanne Kuster on 04/02/19 but states she would like a sooner appt and does not want to see a PA

## 2018-12-30 NOTE — Telephone Encounter (Signed)
She can come in tomorrow 

## 2018-12-30 NOTE — Telephone Encounter (Signed)
Patient unable to come tomorrow due to her husband having an appointment- and he is who takes her to her appointments. Scheduled for Thursday on an opening slot.  Will make MD aware. Thank you!

## 2019-01-02 ENCOUNTER — Other Ambulatory Visit: Payer: Self-pay

## 2019-01-02 ENCOUNTER — Ambulatory Visit
Admission: RE | Admit: 2019-01-02 | Discharge: 2019-01-02 | Disposition: A | Discharge status: Home / Self Care | Payer: Medicare Other | Source: Ambulatory Visit | Attending: Cardiovascular Disease | Admitting: Cardiovascular Disease

## 2019-01-02 ENCOUNTER — Encounter: Payer: Self-pay | Admitting: Cardiovascular Disease

## 2019-01-02 ENCOUNTER — Ambulatory Visit (INDEPENDENT_AMBULATORY_CARE_PROVIDER_SITE_OTHER): Payer: Medicare Other | Admitting: Cardiovascular Disease

## 2019-01-02 VITALS — BP 161/79 | HR 67 | Temp 97.0°F | Ht 64.0 in | Wt 121.0 lb

## 2019-01-02 DIAGNOSIS — I4891 Unspecified atrial fibrillation: Secondary | ICD-10-CM

## 2019-01-02 DIAGNOSIS — E7800 Pure hypercholesterolemia, unspecified: Secondary | ICD-10-CM | POA: Diagnosis not present

## 2019-01-02 DIAGNOSIS — I10 Essential (primary) hypertension: Secondary | ICD-10-CM | POA: Diagnosis not present

## 2019-01-02 DIAGNOSIS — R0989 Other specified symptoms and signs involving the circulatory and respiratory systems: Secondary | ICD-10-CM

## 2019-01-02 DIAGNOSIS — I9789 Other postprocedural complications and disorders of the circulatory system, not elsewhere classified: Secondary | ICD-10-CM | POA: Diagnosis not present

## 2019-01-02 DIAGNOSIS — I2510 Atherosclerotic heart disease of native coronary artery without angina pectoris: Secondary | ICD-10-CM | POA: Diagnosis not present

## 2019-01-02 NOTE — Progress Notes (Signed)
Cardiology Office Note    Date:  01/04/2019   ID:  Leah Pittman, DOB January 03, 1931, MRN [id number]  PCP:  Blane Ohara, MD  Cardiologist:   Thurmon Fair, MD   Chief Complaint  Patient presents with   Chest Pain   Coronary Artery Disease    History of Present Illness:  Leah Pittman is a 85 y.o. female with HTN and severe hypercholesterolemia, labile HTN and multivessel CAD s/p CABG (07/10/2018, [place], LIMA to LAD, SVG to diagonal, sequential SVG to OM1+OM 2), complicated by transient postoperative atrial fibrillation.  Her recovery after surgery was slow but she is back at home receiving PT/OT.  She has developed some problems with pain that is largely positional.  It occasionally occurs on the left side, other times on the right side.  As I recall that, the convincing angina pectoris that eventually led to her coronary angiography was an epigastric complaint that she described applying both palms over her subxiphoid area.  She is not having that now.  She also complains of a cough that has been going on ever since the surgery and is productive of scant amounts of clear sputum.  He denies fever but has had some hoarseness.  On physical exam she has some crackles in her left lung, although these mostly cleared with a deep breath and coughing.  Her blood pressure is high today but she did not take her medications yet.  Her ECG does not show any ischemic changes but she does have a few PVCs.  Her husband Leah Pittman) has completed chemotherapy for lymphoma for the time being.  Unfortunately he has had a variety of complications and all kinds of health issues in the last year.  As before surgery, Leah Pittman has a variety of chest pain complaints.  In my opinion, all of them are clearly musculoskeletal brought on by turning or twisting in a certain direction or by pushing on the costochondral joints.  Her angina was subxiphoid discomfort which she describes motioning with the  palms of both hands over her epigastric area.  The patient specifically denies any chest pain on exertion, dyspnea at rest or with exertion, orthopnea, paroxysmal nocturnal dyspnea, syncope, palpitations, focal neurological deficits, intermittent claudication, lower extremity edema, unexplained weight gain, hemoptysis or wheezing.  In 2013 renal artery duplex did not show evidence of renal artery stenosis.  Pre-bypass carotid scan showed no evidence of significant stenoses and there was antegrade flow in both vertebral arteries.  She has normal left ventricular systolic function and has diastolic dysfunction which appears appropriate for age.  Past Medical History:  Diagnosis Date   Anxiety    Arthritis    Chest pain 2011   CARDIOLITE - no significant symptoms, EKG changes, or arrhythmias   Dyspnea 02/16/2012   2D ECHO - EF 55-60%, normal   Fatigue    Glaucoma    Headache(784.0)    Hiatal hernia    High blood pressure 02/16/2012   RENAL DOPPLER - normal   Hyperlipidemia    Hypothyroidism    Labile hypertension    Lightheadedness    Migraine headache    Multiple allergies    Myalgia    Pain, lower leg    Calf   Problem of menstruation    Reflux    SOB (shortness of breath)    Thyroid disease    Urinary problem     Past Surgical History:  Procedure Laterality Date   ABDOMINAL HYSTERECTOMY  1976  CHOLECYSTECTOMY  2000   CORONARY ARTERY BYPASS GRAFT N/A 07/10/2018   Procedure: CORONARY ARTERY BYPASS GRAFTING (CABG), FREE LIMA;  Surgeon: Gaye Pollack, MD;  Location: [place] OR;  Service: Open Heart Surgery;  Laterality: N/A;   CYSTECTOMY  1974   Intestine   CYSTECTOMY  1996   Brain stem   EYE SURGERY  2003   LEFT HEART CATH AND CORONARY ANGIOGRAPHY N/A 07/08/2018   Procedure: LEFT HEART CATH AND CORONARY ANGIOGRAPHY;  Surgeon: Lorretta Harp, MD;  Location: [place] [facility];  Service: Cardiovascular;  Laterality: N/A;   TEE WITHOUT  CARDIOVERSION N/A 07/10/2018   Procedure: TRANSESOPHAGEAL ECHOCARDIOGRAM (TEE);  Surgeon: Gaye Pollack, MD;  Location: [place];  Service: Open Heart Surgery;  Laterality: N/A;    Current Medications: Outpatient Medications Prior to Visit  Medication Sig Dispense Refill   acetaminophen (TYLENOL) 325 MG tablet Take 975 mg by mouth every 8 (eight) hours as needed for mild pain or headache.      alum & mag hydroxide-simeth (MAALOX/MYLANTA) 200-200-20 MG/5ML suspension Take 30 mLs by mouth every 6 (six) hours as needed for indigestion or heartburn.     amiodarone (PACERONE) 200 MG tablet Take 200 mg by mouth daily.     aspirin 81 MG tablet Take 81 mg by mouth daily.     bisacodyl (DULCOLAX) 5 MG EC tablet Take 10 mg by mouth daily as needed for moderate constipation. Give 2 tablets by mouth daily for constipation     Calcium Citrate-Vitamin D (CALCIUM CITRATE+D3 PETITES PO) Take 1 tablet by mouth daily.     calcium-vitamin D (OSCAL 500/200 D-3) 500-200 MG-UNIT tablet Take 1 tablet by mouth 2 (two) times daily. 1 tablet by mouth 2 times a day     carboxymethylcellulose (REFRESH TEARS) 0.5 % SOLN Place 1-2 drops into both eyes as needed (for dryness or redness).     Cholecalciferol 25 MCG (1000 UT) tablet Take 4,000 Units by mouth daily. Give 4 tablets po daily     docusate sodium (COLACE) 100 MG capsule Take 100 mg by mouth daily as needed for mild constipation.     dorzolamide-timolol (COSOPT) 22.3-6.8 MG/ML ophthalmic solution Place 1 drop into the left eye 2 (two) times daily.      feeding supplement (BOOST HIGH PROTEIN) LIQD Take 1 Container by mouth 3 (three) times daily between meals.     ferrous [id number] C-folic acid (TRINSICON / FOLTRIN) capsule Take 1 capsule by mouth 2 (two) times daily after a meal.     levothyroxine (SYNTHROID) 100 MCG tablet Take 100 mcg by mouth daily before breakfast. TAKE 30 MINUTES PRIOR TO ANY FOOD IN THE AM     lisinopril (ZESTRIL) 5 MG  tablet Take 1 tablet (5 mg total) by mouth daily. (Patient taking differently: Take 2.5 mg by mouth daily. ) 30 tablet 3   LORazepam (ATIVAN) 1 MG tablet Take 1 mg by mouth at bedtime.      magnesium oxide (MAG-OX) 400 MG tablet Take 400 mg by mouth daily.     Melatonin 5 MG TABS Take 10 mg by mouth. TAKE 2 TABS AT BEDTIME     MULTIPLE VITAMINS-MINERALS PO Take 1 tablet by mouth. 1 tablet by mouth daily     nitrofurantoin (MACRODANTIN) 100 MG capsule Take 100 mg by mouth 2 (two) times daily.     nitroGLYCERIN (NITROSTAT) 0.4 MG SL tablet Place 0.4 mg under the tongue every 5 (five) minutes as needed for chest pain.  ondansetron (ZOFRAN) 8 MG tablet Take 8 mg by mouth 2 (two) times daily as needed for nausea or vomiting.     pantoprazole (PROTONIX) 40 MG tablet Take 40 mg by mouth 2 (two) times a day.      polyethylene glycol powder (GLYCOLAX/MIRALAX) 17 GM/SCOOP powder Take by mouth once. Give 17 grams by mouth daily     senna (SENOKOT) 8.6 MG TABS tablet Take 1 tablet by mouth. 1 tablet by mouth every 24 hours as needed for constipation     simvastatin (ZOCOR) 20 MG tablet Take 20 mg by mouth daily with supper.      Travoprost, BAK Free, (TRAVATAN Z) 0.004 % SOLN ophthalmic solution Place 1 drop into both eyes at bedtime.      Vitamin D, Ergocalciferol, (DRISDOL) 1.25 MG (50000 UT) CAPS capsule Take 50,000 Units by mouth every Monday.      No facility-administered medications prior to visit.      Allergies:   Benadryl [diphenhydramine], Codeine, Meperidine, Prednisone, Promethazine, Propranolol, Shellfish allergy, Tape, Tazarotene, and Iodinated diagnostic agents   Social History   Socioeconomic History   Marital status: Married    Spouse name: Not on file   Number of children: 3   Years of education: college   Highest education level: Not on file  Occupational History   Occupation: Retired  Ecologist strain: Not on file   Food  insecurity    Worry: Not on file    Inability: Not on Occupational hygienist needs    Medical: Not on file    Non-medical: Not on file  Tobacco Use   Smoking status: Never Smoker   Smokeless tobacco: Never Used  Substance and Sexual Activity   Alcohol use: No   Drug use: No   Sexual activity: Not on file  Lifestyle   Physical activity    Days per week: Not on file    Minutes per session: Not on file   Stress: Not on file  Relationships   Social connections    Talks on phone: Not on file    Gets together: Not on file    Attends religious service: Not on file    Active member of club or organization: Not on file    Attends meetings of clubs or organizations: Not on file    Relationship status: Not on file  Other Topics Concern   Not on file  Social History Narrative   Lives with husband in [place], [place]     Family History:  The patient's family history includes Asthma in her brother; Cancer in her brother, brother, and sister; Heart attack in her father; Stroke in her father.   ROS:   Please see the history of present illness.    ROS All other systems reviewed and are negative.   PHYSICAL EXAM:   VS:  BP (!) 161/79    Pulse 67    Temp (!) 97 F (36.1 C)    Ht  (1.626 m)    Wt 121 lb (54.9 kg)    SpO2 95%    BMI 20.77 kg/m      General: Alert, oriented x3, no distress, lean Head: no evidence of trauma, PERRL, EOMI, no exophtalmos or lid lag, no myxedema, no xanthelasma; normal ears, nose and oropharynx Neck: normal jugular venous pulsations and no hepatojugular reflux; brisk carotid pulses without delay and no carotid bruits Chest: A few crackles overlying the left lung that do not  clear completely after cough/deep breath, no signs of consolidation by percussion or palpation, normal fremitus, symmetrical and full respiratory excursions Cardiovascular: normal position and quality of the apical impulse, regular rhythm, normal first and second heart sounds, no  murmurs, rubs or gallops Abdomen: no tenderness or distention, no masses by palpation, no abnormal pulsatility or arterial bruits, normal bowel sounds, no hepatosplenomegaly Extremities: no clubbing, cyanosis or edema; 2+ radial, ulnar and brachial pulses bilaterally; 2+ right femoral, posterior tibial and dorsalis pedis pulses; 2+ left femoral, posterior tibial and dorsalis pedis pulses; no subclavian or femoral bruits Neurological: grossly nonfocal Psych: Normal mood and affect     Wt Readings from Last 3 Encounters:  01/02/19 121 lb (54.9 kg)  10/21/18 117 lb (53.1 kg)  09/23/18 117 lb (53.1 kg)      Studies/Labs Reviewed:   EKG:  EKG is ordered today.  It shows normal sinus rhythm is otherwise normal tracing Recent Labs: 09/23/2018 Total cholesterol 170, HDL 64, LDL 90, triglycerides 185 Hemoglobin A1c 5.8% Hemoglobin 12.0, creatinine 0.84, potassium 4.3, normal TSH and liver function tests  ASSESSMENT:    1. Coronary artery disease involving native coronary artery of native heart without angina pectoris   2. Postoperative atrial fibrillation (HCC)   3. Essential hypertension   4. Hypercholesterolemia   5. Respiratory crackles at left lung base      PLAN:  In order of problems listed above:  1. CAD s/p CABG: Current symptoms different from angina (which seems to be primarily epigastric discomfort).  She had multiple complaints of all types of chest discomfort even before her bypass surgery and I suspect he will continue to do so in the future.  No changes on ECG noted. 2. Postop AFib:  No recurrence since immediate postop period.  No longer on anticoagulation 3. HTN: She did not take her blood pressure medicines today.  Hard to assess the adequacy of her current regimen.  No changes were made. 4. HLP: All lipid parameters are at target. 5. Abn lung exam: We will check a chest x-Leah, but it may just be pulmonary atelectasis that I am hearing.   Medication  Adjustments/Labs and Tests Ordered: Current medicines are reviewed at length with the patient today.  Concerns regarding medicines are outlined above.  Medication changes, Labs and Tests ordered today are listed in the Patient Instructions below. Patient Instructions  Medication Instructions:  No changes *If you need a refill on your cardiac medications before your next appointment, please call your pharmacy*  Lab Work: None ordered If you have labs (blood work) drawn today and your tests are completely normal, you will receive your results only by:  MyChart Message (if you have MyChart) OR  A paper copy in the mail If you have any lab test that is abnormal or we need to change your treatment, we will call you to review the results.  Testing/Procedures: A chest x-Leah takes a picture of the organs and structures inside the chest, including the heart, lungs, and blood vessels. This test can show several things, including, whether the heart is enlarges; whether fluid is building up in the lungs; and whether pacemaker / defibrillator leads are still in place.  [facility].   Follow-Up: At [facility], you and your health needs are our priority.  As part of our continuing mission to provide you with exceptional heart care, we have created designated Provider Care Teams.  These Care Teams include your primary Cardiologist (physician)  and Advanced Practice Providers (APPs -  Physician Assistants and Nurse Practitioners) who all work together to provide you with the care you need, when you need it.  Your next appointment:   3 months, the same day as your husband  The format for your next appointment:   In Person  Provider:   Thurmon Fair, MD      Signed, Thurmon Fair, MD  01/04/2019 7:34 PM    [facility] [address] [place], [place], [place]  [address] Phone: [phone]; Fax: [phone]

## 2019-01-02 NOTE — Patient Instructions (Signed)
Medication Instructions:  No changes *If you need a refill on your cardiac medications before your next appointment, please call your pharmacy*  Lab Work: None ordered If you have labs (blood work) drawn today and your tests are completely normal, you will receive your results only by: [place] MyChart Message (if you have MyChart) OR . A paper copy in the mail If you have any lab test that is abnormal or we need to change your treatment, we will call you to review the results.  Testing/Procedures: A chest x-ray takes a picture of the organs and structures inside the chest, including the heart, lungs, and blood vessels. This test can show several things, including, whether the heart is enlarges; whether fluid is building up in the lungs; and whether pacemaker / defibrillator leads are still in place.  This has been ordered at [place].   Follow-Up: At [facility], you and your health needs are our priority.  As part of our continuing mission to provide you with exceptional heart care, we have created designated Provider Care Teams.  These Care Teams include your primary Cardiologist (physician) and Advanced Practice Providers (APPs -  Physician Assistants and Nurse Practitioners) who all work together to provide you with the care you need, when you need it.  Your next appointment:   3 months, the same day as your husband  The format for your next appointment:   In Person  Provider:   Sanda Klein, MD

## 2019-01-04 ENCOUNTER — Encounter: Payer: Self-pay | Admitting: Cardiovascular Disease

## 2019-04-02 ENCOUNTER — Ambulatory Visit: Payer: Medicare Other | Admitting: Cardiovascular Disease

## 2019-04-04 ENCOUNTER — Other Ambulatory Visit: Payer: Self-pay

## 2019-04-04 ENCOUNTER — Encounter (INDEPENDENT_AMBULATORY_CARE_PROVIDER_SITE_OTHER): Payer: Self-pay

## 2019-04-04 ENCOUNTER — Encounter: Payer: Self-pay | Admitting: Cardiovascular Disease

## 2019-04-04 ENCOUNTER — Ambulatory Visit (INDEPENDENT_AMBULATORY_CARE_PROVIDER_SITE_OTHER): Payer: Medicare Other | Admitting: Cardiovascular Disease

## 2019-04-04 VITALS — BP 200/76 | HR 61 | Temp 97.3°F | Ht 64.0 in | Wt 120.6 lb

## 2019-04-04 DIAGNOSIS — I2510 Atherosclerotic heart disease of native coronary artery without angina pectoris: Secondary | ICD-10-CM | POA: Diagnosis not present

## 2019-04-04 DIAGNOSIS — I9789 Other postprocedural complications and disorders of the circulatory system, not elsewhere classified: Secondary | ICD-10-CM

## 2019-04-04 DIAGNOSIS — I10 Essential (primary) hypertension: Secondary | ICD-10-CM | POA: Diagnosis not present

## 2019-04-04 DIAGNOSIS — E7800 Pure hypercholesterolemia, unspecified: Secondary | ICD-10-CM | POA: Diagnosis not present

## 2019-04-04 DIAGNOSIS — I4891 Unspecified atrial fibrillation: Secondary | ICD-10-CM

## 2019-04-04 NOTE — Patient Instructions (Signed)
Medication Instructions:  No changes *If you need a refill on your cardiac medications before your next appointment, please call your pharmacy*  Lab Work: None ordered If you have labs (blood work) drawn today and your tests are completely normal, you will receive your results only by: . MyChart Message (if you have MyChart) OR . A paper copy in the mail If you have any lab test that is abnormal or we need to change your treatment, we will call you to review the results.  Testing/Procedures: None ordered  Follow-Up: At [facility], you and your health needs are our priority.  As part of our continuing mission to provide you with exceptional heart care, we have created designated Provider Care Teams.  These Care Teams include your primary Cardiologist (physician) and Advanced Practice Providers (APPs -  Physician Assistants and Nurse Practitioners) who all work together to provide you with the care you need, when you need it.  Your next appointment:   6 month(s)  The format for your next appointment:   In Person  Provider:   You may see Mihai Croitoru, MD or one of the following Advanced Practice Providers on your designated Care Team:    Hao Meng, PA-C  Angela Duke, PA-C or   Krista Kroeger, PA-C   

## 2019-04-04 NOTE — Progress Notes (Signed)
Cardiology Office Note    Date:  04/05/2019   ID:  Leah Pittman, DOB 01-30-1931, MRN [id number]  PCP:  Blane Ohara, MD  Cardiologist:   Thurmon Fair, MD   Chief Complaint  Patient presents with  . Coronary Artery Disease    History of Present Illness:  Leah Pittman is a 86 y.o. female with HTN and severe hypercholesterolemia, labile HTN and multivessel CAD s/p CABG (07/10/2018, [place], LIMA to LAD, SVG to diagonal, sequential SVG to OM1+OM 2), complicated by transient postoperative atrial fibrillation.  She is here with her husband Ray who is also my patient.  She continues to complain of a cough early in the morning productive of sticky sputum, better later in the day.  Because of this her lisinopril was stopped and she is now taking losartan, but this has not really made a difference.  Her blood pressure is consistently high.  As she did before she had diagnosis of CAD and bypass surgery she has a variety of different chest pain syndromes.  Mostly she compares of discomfort in the left axillary area, sometimes discomfort in the upper chest.  As I recall, her angina was a pressure sensation in the epigastrium which she described pushing down with both times.  She denies orthopnea, PND, exertional dyspnea, syncope, palpitations, focal neurological deficits, claudication, lower extremity edema, hemoptysis or wheezing.  In 2013 renal artery duplex did not show evidence of renal artery stenosis.  Pre-bypass carotid scan showed no evidence of significant stenoses and there was antegrade flow in both vertebral arteries.  She has normal left ventricular systolic function and has diastolic dysfunction which appears appropriate for age.  Past Medical History:  Diagnosis Date  . Anxiety   . Arthritis   . Chest pain 2011   CARDIOLITE - no significant symptoms, EKG changes, or arrhythmias  . Dyspnea 02/16/2012   2D ECHO - EF 55-60%, normal  . Fatigue   . Glaucoma   .  Headache(784.0)   . Hiatal hernia   . High blood pressure 02/16/2012   RENAL DOPPLER - normal  . Hyperlipidemia   . Hypothyroidism   . Labile hypertension   . Lightheadedness   . Migraine headache   . Multiple allergies   . Myalgia   . Pain, lower leg    Calf  . Problem of menstruation   . Reflux   . SOB (shortness of breath)   . Thyroid disease   . Urinary problem     Past Surgical History:  Procedure Laterality Date  . ABDOMINAL HYSTERECTOMY  1976  . CHOLECYSTECTOMY  2000  . CORONARY ARTERY BYPASS GRAFT N/A 07/10/2018   Procedure: CORONARY ARTERY BYPASS GRAFTING (CABG), FREE LIMA;  Surgeon: [name], MD;  Location: [facility] OR;  Service: Open Heart Surgery;  Laterality: N/A;  . CYSTECTOMY  1974   Intestine  . CYSTECTOMY  1996   Brain stem  . EYE SURGERY  2003  . LEFT HEART CATH AND CORONARY ANGIOGRAPHY N/A 07/08/2018   Procedure: LEFT HEART CATH AND CORONARY ANGIOGRAPHY;  Surgeon: Runell Gess, MD;  Location: [facility] INVASIVE CV LAB;  Service: Cardiovascular;  Laterality: N/A;  . TEE WITHOUT CARDIOVERSION N/A 07/10/2018   Procedure: TRANSESOPHAGEAL ECHOCARDIOGRAM (TEE);  Surgeon: [name], MD;  Location: [facility];  Service: Open Heart Surgery;  Laterality: N/A;    Current Medications: Outpatient Medications Prior to Visit  Medication Sig Dispense Refill  . acetaminophen (TYLENOL) 325 MG tablet Take 975 mg by  mouth every 8 (eight) hours as needed for mild pain or headache.     Marland Kitchen aspirin 81 MG tablet Take 81 mg by mouth daily.    . bisacodyl (DULCOLAX) 5 MG EC tablet Take 10 mg by mouth daily as needed for moderate constipation. Give 2 tablets by mouth daily for constipation    . Calcium Citrate-Vitamin D (CALCIUM CITRATE+D3 PETITES PO) Take 1 tablet by mouth daily.    . calcium-vitamin D (OSCAL 500/200 D-3) 500-200 MG-UNIT tablet Take 1 tablet by mouth 2 (two) times daily. 1 tablet by mouth 2 times a day    . carboxymethylcellulose (REFRESH TEARS) 0.5 % SOLN Place 1-2  drops into both eyes as needed (for dryness or redness).    . Cholecalciferol 25 MCG (1000 UT) tablet Take 4,000 Units by mouth daily. Give 4 tablets po daily    . dorzolamide-timolol (COSOPT) 22.3-6.8 MG/ML ophthalmic solution Place 1 drop into the left eye 2 (two) times daily.     . feeding supplement (BOOST HIGH PROTEIN) LIQD Take 1 Container by mouth 3 (three) times daily between meals.    . ferrous [id number] C-folic acid (TRINSICON / FOLTRIN) capsule Take 1 capsule by mouth 2 (two) times daily after a meal.    . levothyroxine (SYNTHROID) 100 MCG tablet Take 100 mcg by mouth daily before breakfast. TAKE 30 MINUTES PRIOR TO ANY FOOD IN THE AM    . LORazepam (ATIVAN) 1 MG tablet Take 1 mg by mouth at bedtime.     Marland Kitchen losartan (COZAAR) 50 MG tablet Take 50 mg by mouth daily.    . magnesium oxide (MAG-OX) 400 MG tablet Take 400 mg by mouth daily.    . nitroGLYCERIN (NITROSTAT) 0.4 MG SL tablet Place 0.4 mg under the tongue every 5 (five) minutes as needed for chest pain.     Marland Kitchen ondansetron (ZOFRAN) 8 MG tablet Take 8 mg by mouth 2 (two) times daily as needed for nausea or vomiting.    . pantoprazole (PROTONIX) 40 MG tablet Take 40 mg by mouth 2 (two) times a day.     . polyethylene glycol powder (GLYCOLAX/MIRALAX) 17 GM/SCOOP powder Take by mouth once. Give 17 grams by mouth daily    . simvastatin (ZOCOR) 20 MG tablet Take 20 mg by mouth daily with supper.     . Travoprost, BAK Free, (TRAVATAN Z) 0.004 % SOLN ophthalmic solution Place 1 drop into both eyes at bedtime.     . Vitamin D, Ergocalciferol, (DRISDOL) 1.25 MG (50000 UT) CAPS capsule Take 50,000 Units by mouth every Monday.     Marland Kitchen alum & mag hydroxide-simeth (MAALOX/MYLANTA) 200-200-20 MG/5ML suspension Take 30 mLs by mouth every 6 (six) hours as needed for indigestion or heartburn.    Marland Kitchen amiodarone (PACERONE) 200 MG tablet Take 200 mg by mouth daily.    Marland Kitchen docusate sodium (COLACE) 100 MG capsule Take 100 mg by mouth daily as needed  for mild constipation.    Marland Kitchen lisinopril (ZESTRIL) 5 MG tablet Take 1 tablet (5 mg total) by mouth daily. (Patient taking differently: Take 2.5 mg by mouth daily. ) 30 tablet 3  . Melatonin 5 MG TABS Take 10 mg by mouth. TAKE 2 TABS AT BEDTIME    . MULTIPLE VITAMINS-MINERALS PO Take 1 tablet by mouth. 1 tablet by mouth daily    . nitrofurantoin (MACRODANTIN) 100 MG capsule Take 100 mg by mouth 2 (two) times daily.    Marland Kitchen senna (SENOKOT) 8.6 MG TABS tablet Take 1  tablet by mouth. 1 tablet by mouth every 24 hours as needed for constipation     No facility-administered medications prior to visit.     Allergies:   Benadryl [diphenhydramine], Codeine, Meperidine, Prednisone, Promethazine, Propranolol, Shellfish allergy, Tape, Tazarotene, and Iodinated diagnostic agents   Social History   Socioeconomic History  . Marital status: Married    Spouse name: Not on file  . Number of children: 3  . Years of education: college  . Highest education level: Not on file  Occupational History  . Occupation: Retired  Tobacco Use  . Smoking status: Never Smoker  . Smokeless tobacco: Never Used  Substance and Sexual Activity  . Alcohol use: No  . Drug use: No  . Sexual activity: Not on file  Other Topics Concern  . Not on file  Social History Narrative   Lives with husband in [place], [place]   Social Determinants of Health   Financial Resource Strain:   . Difficulty of Paying Living Expenses: Not on file  Food Insecurity:   . Worried About Programme researcher, broadcasting/film/video in the Last Year: Not on file  . Ran Out of Food in the Last Year: Not on file  Transportation Needs:   . Lack of Transportation (Medical): Not on file  . Lack of Transportation (Non-Medical): Not on file  Physical Activity:   . Days of Exercise per Week: Not on file  . Minutes of Exercise per Session: Not on file  Stress:   . Feeling of Stress : Not on file  Social Connections:   . Frequency of Communication with Friends and Family: Not on  file  . Frequency of Social Gatherings with Friends and Family: Not on file  . Attends Religious Services: Not on file  . Active Member of Clubs or Organizations: Not on file  . Attends Banker Meetings: Not on file  . Marital Status: Not on file     Family History:  The patient's family history includes Asthma in her brother; Cancer in her brother, brother, and sister; Heart attack in her father; Stroke in her father.   ROS:   Please see the history of present illness.    ROS All other systems reviewed and are negative.   PHYSICAL EXAM:   VS:  BP (!) 200/76   Pulse 61   Temp (!) 97.3 F (36.3 C)   Ht 5\' 4"  (1.626 m)   Wt 120 lb 9.6 oz (54.7 kg)   SpO2 96%   BMI 20.70 kg/m      General: Alert, oriented x3, no distress, lean Head: no evidence of trauma, PERRL, EOMI, no exophtalmos or lid lag, no myxedema, no xanthelasma; normal ears, nose and oropharynx Neck: normal jugular venous pulsations and no hepatojugular reflux; brisk carotid pulses without delay and no carotid bruits Chest: clear to auscultation, no signs of consolidation by percussion or palpation, normal fremitus, symmetrical and full respiratory excursions Cardiovascular: normal position and quality of the apical impulse, regular rhythm, normal first and second heart sounds, no murmurs, rubs or gallops Abdomen: no tenderness or distention, no masses by palpation, no abnormal pulsatility or arterial bruits, normal bowel sounds, no hepatosplenomegaly Extremities: no clubbing, cyanosis or edema; 2+ radial, ulnar and brachial pulses bilaterally; 2+ right femoral, posterior tibial and dorsalis pedis pulses; 2+ left femoral, posterior tibial and dorsalis pedis pulses; no subclavian or femoral bruits Neurological: grossly nonfocal Psych: Normal mood and affect   Wt Readings from Last 3 Encounters:  04/04/19 120  lb 9.6 oz (54.7 kg)  01/02/19 121 lb (54.9 kg)  10/21/18 117 lb (53.1 kg)    Studies/Labs  Reviewed:   EKG:  EKG is ordered today.  It shows normal sinus rhythm, nonspecific T wave changes, QTC 424 ms Recent Labs: 09/23/2018 Total cholesterol 170, HDL 64, LDL 90, triglycerides 185 Hemoglobin A1c 5.8% Hemoglobin 12.0, creatinine 0.84, potassium 4.3, normal TSH and liver function tests  01/21/2019 Total cholesterol 164, HDL 69, LDL 69, triglycerides 90 Hemoglobin A1c 5.7%, hemoglobin 12.6, creatinine 0.88, potassium 5.1, normal liver function tests, TSH 0.441  ASSESSMENT:    1. Coronary artery disease involving native coronary artery of native heart without angina pectoris   2. Essential hypertension   3. Postoperative atrial fibrillation (HCC)   4. Hypercholesterolemia      PLAN:  In order of problems listed above:  1. CAD s/p CABG: It is always been very difficult to distinguish her multiple musculoskeletal complaints from angina.  I do not think the current issues are angina.  No EKG changes. 2. Postop AFib: No recurrence since the immediate postop period.  No longer taking amiodarone or anticoagulants. 3. HTN: Increase losartan to 50 mg once daily. 4. HLP: All lipid parameters are at target.     Medication Adjustments/Labs and Tests Ordered: Current medicines are reviewed at length with the patient today.  Concerns regarding medicines are outlined above.  Medication changes, Labs and Tests ordered today are listed in the Patient Instructions below. Patient Instructions  Medication Instructions:  No changes *If you need a refill on your cardiac medications before your next appointment, please call your pharmacy*  Lab Work: None ordered If you have labs (blood work) drawn today and your tests are completely normal, you will receive your results only by: Marland Kitchen MyChart Message (if you have MyChart) OR . A paper copy in the mail If you have any lab test that is abnormal or we need to change your treatment, we will call you to review the  results.  Testing/Procedures: None ordered  Follow-Up: At [facility], you and your health needs are our priority.  As part of our continuing mission to provide you with exceptional heart care, we have created designated Provider Care Teams.  These Care Teams include your primary Cardiologist (physician) and Advanced Practice Providers (APPs -  Physician Assistants and Nurse Practitioners) who all work together to provide you with the care you need, when you need it.  Your next appointment:   6 month(s)  The format for your next appointment:   In Person  Provider:   You may see Thurmon Fair, MD or one of the following Advanced Practice Providers on your designated Care Team:    Azalee Course, PA-C  Micah Flesher, [place] or   Judy Pimple, PA-C      Signed, Thurmon Fair, MD  04/05/2019 3:30 PM    [facility] [address] [place], [place], [place]  [address] Phone: [phone]; Fax: [phone]

## 2019-04-05 ENCOUNTER — Encounter: Payer: Self-pay | Admitting: Cardiovascular Disease

## 2019-04-22 ENCOUNTER — Ambulatory Visit: Payer: Medicare Other | Admitting: Family Medicine

## 2019-04-23 ENCOUNTER — Other Ambulatory Visit: Payer: Self-pay | Admitting: Family Medicine

## 2019-04-30 ENCOUNTER — Other Ambulatory Visit: Payer: Self-pay | Admitting: Family Medicine

## 2019-05-07 ENCOUNTER — Telehealth: Payer: Self-pay

## 2019-05-07 ENCOUNTER — Other Ambulatory Visit: Payer: Self-pay

## 2019-05-07 MED ORDER — SIMVASTATIN 20 MG PO TABS: 20.0000 mg | ORAL_TABLET | Freq: Every day | ORAL | 1 refills | Status: DC

## 2019-05-07 NOTE — Telephone Encounter (Signed)
PHARMACY: [facility]  MEDICATION: Simvastatin 20 mg take 1 tablet at night  MSG: The pt came by the office this afternoon requesting a refill on the medication listed above. Also, she is wanting someone to recommend a different women's care probiotic.

## 2019-05-08 ENCOUNTER — Other Ambulatory Visit: Payer: Self-pay | Admitting: Family Medicine

## 2019-05-08 MED ORDER — SIMVASTATIN 20 MG PO TABS: 20.0000 mg | ORAL_TABLET | Freq: Every day | ORAL | 1 refills | Status: DC

## 2019-05-12 ENCOUNTER — Other Ambulatory Visit: Payer: Self-pay

## 2019-05-13 ENCOUNTER — Other Ambulatory Visit: Payer: Self-pay

## 2019-05-13 MED ORDER — SIMVASTATIN 20 MG PO TABS: 20.0000 mg | ORAL_TABLET | Freq: Every day | ORAL | 0 refills | Status: DC

## 2019-05-13 NOTE — Telephone Encounter (Signed)
Patient's Insurance is requiring that her prescription be switched from [facility] DD to [facility] for her Simvastatin.  RX sent.

## 2019-07-09 ENCOUNTER — Telehealth: Payer: Self-pay | Admitting: Cardiovascular Disease

## 2019-07-09 NOTE — Telephone Encounter (Signed)
1. What dental office are you calling from? [facility]   2. What is your office phone number? [phone]  3. What is your fax number? [phone]  4. What type of procedure is the patient having performed? Tooth excration  5. What date is procedure scheduled or is the patient there now? 07/10/19 (if the patient is at the dentist's office question goes to their cardiologist if he/she is in the office.  If not, question should go to the DOD).   6. What is your question (ex. Antibiotics prior to procedure, holding medication-we need to know how long dentist wants pt to hold med)? They want to know if patient can have some sedation.

## 2019-07-10 NOTE — Telephone Encounter (Signed)
   Primary Cardiologist: Thurmon Fair, MD  Chart reviewed as part of pre-operative protocol coverage.   Simple dental extractions are considered low risk procedures per guidelines and generally do not require any specific cardiac clearance. It is also generally accepted that for simple extractions and dental cleanings, there is no need to interrupt blood thinner therapy.  SBE prophylaxis is/is not required for the patient.  I will route this recommendation to the requesting party via Epic fax function and remove from pre-op pool.  Please call with questions.  Georgie Chard, NP 07/10/2019, 8:19 AM

## 2019-07-13 ENCOUNTER — Other Ambulatory Visit: Payer: Self-pay | Admitting: Family Medicine

## 2019-07-16 ENCOUNTER — Other Ambulatory Visit: Payer: Self-pay

## 2019-07-16 MED ORDER — ONDANSETRON HCL 8 MG PO TABS: 8.0000 mg | ORAL_TABLET | Freq: Two times a day (BID) | ORAL | 1 refills | Status: DC | PRN

## 2019-07-17 ENCOUNTER — Other Ambulatory Visit: Payer: Self-pay

## 2019-07-17 MED ORDER — PANTOPRAZOLE SODIUM 40 MG PO TBEC: 40.0000 mg | DELAYED_RELEASE_TABLET | Freq: Every day | ORAL | 0 refills | Status: DC

## 2019-07-17 MED ORDER — ONDANSETRON HCL 8 MG PO TABS: 8.0000 mg | ORAL_TABLET | Freq: Two times a day (BID) | ORAL | 1 refills | Status: DC | PRN

## 2019-07-24 ENCOUNTER — Inpatient Hospital Stay (HOSPITAL_COMMUNITY)
Admission: EM | Admit: 2019-07-24 | Discharge: 2019-07-27 | DRG: 281 | Disposition: A | Discharge status: Home Health | Payer: Medicare Other | Attending: Internal Medicine | Admitting: Internal Medicine

## 2019-07-24 ENCOUNTER — Other Ambulatory Visit: Payer: Self-pay

## 2019-07-24 ENCOUNTER — Emergency Department (HOSPITAL_COMMUNITY): Payer: Medicare Other

## 2019-07-24 ENCOUNTER — Encounter (HOSPITAL_COMMUNITY): Payer: Self-pay

## 2019-07-24 DIAGNOSIS — R079 Chest pain, unspecified: Secondary | ICD-10-CM | POA: Diagnosis not present

## 2019-07-24 DIAGNOSIS — E039 Hypothyroidism, unspecified: Secondary | ICD-10-CM | POA: Diagnosis present

## 2019-07-24 DIAGNOSIS — Z8249 Family history of ischemic heart disease and other diseases of the circulatory system: Secondary | ICD-10-CM

## 2019-07-24 DIAGNOSIS — I169 Hypertensive crisis, unspecified: Secondary | ICD-10-CM

## 2019-07-24 DIAGNOSIS — K5900 Constipation, unspecified: Secondary | ICD-10-CM | POA: Diagnosis present

## 2019-07-24 DIAGNOSIS — I214 Non-ST elevation (NSTEMI) myocardial infarction: Secondary | ICD-10-CM | POA: Diagnosis not present

## 2019-07-24 DIAGNOSIS — Z91041 Radiographic dye allergy status: Secondary | ICD-10-CM

## 2019-07-24 DIAGNOSIS — R7989 Other specified abnormal findings of blood chemistry: Secondary | ICD-10-CM

## 2019-07-24 DIAGNOSIS — T465X6A Underdosing of other antihypertensive drugs, initial encounter: Secondary | ICD-10-CM | POA: Diagnosis present

## 2019-07-24 DIAGNOSIS — H409 Unspecified glaucoma: Secondary | ICD-10-CM | POA: Diagnosis present

## 2019-07-24 DIAGNOSIS — I161 Hypertensive emergency: Secondary | ICD-10-CM | POA: Diagnosis present

## 2019-07-24 DIAGNOSIS — E785 Hyperlipidemia, unspecified: Secondary | ICD-10-CM | POA: Diagnosis present

## 2019-07-24 DIAGNOSIS — R110 Nausea: Secondary | ICD-10-CM

## 2019-07-24 DIAGNOSIS — Z66 Do not resuscitate: Secondary | ICD-10-CM | POA: Diagnosis present

## 2019-07-24 DIAGNOSIS — F419 Anxiety disorder, unspecified: Secondary | ICD-10-CM | POA: Diagnosis present

## 2019-07-24 DIAGNOSIS — I10 Essential (primary) hypertension: Secondary | ICD-10-CM | POA: Diagnosis present

## 2019-07-24 DIAGNOSIS — Z91128 Patient's intentional underdosing of medication regimen for other reason: Secondary | ICD-10-CM

## 2019-07-24 DIAGNOSIS — M47812 Spondylosis without myelopathy or radiculopathy, cervical region: Secondary | ICD-10-CM | POA: Diagnosis present

## 2019-07-24 DIAGNOSIS — Z90710 Acquired absence of both cervix and uterus: Secondary | ICD-10-CM

## 2019-07-24 DIAGNOSIS — Z79899 Other long term (current) drug therapy: Secondary | ICD-10-CM

## 2019-07-24 DIAGNOSIS — R0789 Other chest pain: Secondary | ICD-10-CM

## 2019-07-24 DIAGNOSIS — Z9049 Acquired absence of other specified parts of digestive tract: Secondary | ICD-10-CM

## 2019-07-24 DIAGNOSIS — E7800 Pure hypercholesterolemia, unspecified: Secondary | ICD-10-CM | POA: Diagnosis present

## 2019-07-24 DIAGNOSIS — M542 Cervicalgia: Secondary | ICD-10-CM

## 2019-07-24 DIAGNOSIS — Z91013 Allergy to seafood: Secondary | ICD-10-CM

## 2019-07-24 DIAGNOSIS — D72829 Elevated white blood cell count, unspecified: Secondary | ICD-10-CM | POA: Diagnosis present

## 2019-07-24 DIAGNOSIS — K219 Gastro-esophageal reflux disease without esophagitis: Secondary | ICD-10-CM | POA: Diagnosis present

## 2019-07-24 DIAGNOSIS — Z951 Presence of aortocoronary bypass graft: Secondary | ICD-10-CM

## 2019-07-24 DIAGNOSIS — E876 Hypokalemia: Secondary | ICD-10-CM | POA: Diagnosis present

## 2019-07-24 DIAGNOSIS — Z885 Allergy status to narcotic agent status: Secondary | ICD-10-CM

## 2019-07-24 DIAGNOSIS — Y92009 Unspecified place in unspecified non-institutional (private) residence as the place of occurrence of the external cause: Secondary | ICD-10-CM

## 2019-07-24 DIAGNOSIS — G8929 Other chronic pain: Secondary | ICD-10-CM | POA: Diagnosis present

## 2019-07-24 DIAGNOSIS — I2510 Atherosclerotic heart disease of native coronary artery without angina pectoris: Secondary | ICD-10-CM | POA: Diagnosis present

## 2019-07-24 DIAGNOSIS — Z825 Family history of asthma and other chronic lower respiratory diseases: Secondary | ICD-10-CM

## 2019-07-24 DIAGNOSIS — Z79890 Hormone replacement therapy (postmenopausal): Secondary | ICD-10-CM

## 2019-07-24 DIAGNOSIS — Z823 Family history of stroke: Secondary | ICD-10-CM

## 2019-07-24 DIAGNOSIS — Z888 Allergy status to other drugs, medicaments and biological substances status: Secondary | ICD-10-CM

## 2019-07-24 DIAGNOSIS — Z20822 Contact with and (suspected) exposure to covid-19: Secondary | ICD-10-CM | POA: Diagnosis present

## 2019-07-24 DIAGNOSIS — R778 Other specified abnormalities of plasma proteins: Secondary | ICD-10-CM

## 2019-07-24 DIAGNOSIS — Z7982 Long term (current) use of aspirin: Secondary | ICD-10-CM

## 2019-07-24 HISTORY — DX: Cervicalgia: M54.2

## 2019-07-24 LAB — BASIC METABOLIC PANEL
Anion gap: 12 (ref 5–15)
BUN: 10 mg/dL (ref 8–23)
CO2: 21 mmol/L — ABNORMAL LOW (ref 22–32)
Calcium: 10.2 mg/dL (ref 8.9–10.3)
Chloride: 104 mmol/L (ref 98–111)
Creatinine, Ser: 0.78 mg/dL (ref 0.44–1.00)
GFR calc Af Amer: >60 mL/min (ref >60)
GFR calc non Af Amer: >60 mL/min (ref >60)
Glucose, Bld: 96 mg/dL (ref 70–99)
Potassium: 4.4 mmol/L (ref 3.5–5.1)
Sodium: 137 mmol/L (ref 135–145)

## 2019-07-24 LAB — CBC
HCT: 39.1 % (ref 36.0–46.0)
Hemoglobin: 12.6 g/dL (ref 12.0–15.0)
MCH: 30.9 pg (ref 26.0–34.0)
MCHC: 32.2 g/dL (ref 30.0–36.0)
MCV: 95.8 fL (ref 80.0–100.0)
Platelets: 256 10*3/uL (ref 150–400)
RBC: 4.08 MIL/uL (ref 3.87–5.11)
RDW: 14 % (ref 11.5–15.5)
WBC: 7.5 10*3/uL (ref 4.0–10.5)
nRBC: 0 % (ref 0.0–0.2)

## 2019-07-24 LAB — POC OCCULT BLOOD, ED: Fecal Occult Bld: NEGATIVE

## 2019-07-24 LAB — TROPONIN I (HIGH SENSITIVITY)
Troponin I (High Sensitivity): 2 ng/L (ref <18)
Troponin I (High Sensitivity): 79 ng/L — ABNORMAL HIGH (ref <18)

## 2019-07-24 LAB — SARS CORONAVIRUS 2 BY RT PCR (HOSPITAL ORDER, PERFORMED IN ~~LOC~~ HOSPITAL LAB): SARS Coronavirus 2: NEGATIVE

## 2019-07-24 MED ORDER — LORAZEPAM 1 MG PO TABS
1.0000 mg | ORAL_TABLET | Freq: Two times a day (BID) | ORAL | Status: DC | PRN
  Administered 2019-07-25 – 2019-07-26 (×3): 1 mg via ORAL
  Filled 2019-07-24 (×3): qty 1

## 2019-07-24 MED ORDER — LEVOTHYROXINE SODIUM 100 MCG PO TABS
100.0000 ug | ORAL_TABLET | Freq: Every day | ORAL | Status: DC
  Administered 2019-07-25 – 2019-07-27 (×3): 100 ug via ORAL
  Filled 2019-07-24 (×4): qty 1

## 2019-07-24 MED ORDER — ENOXAPARIN SODIUM 40 MG/0.4ML ~~LOC~~ SOLN: 40.0000 mg | SUBCUTANEOUS | Status: DC

## 2019-07-24 MED ORDER — LOSARTAN POTASSIUM 25 MG PO TABS
25.0000 mg | ORAL_TABLET | Freq: Every day | ORAL | Status: DC
  Administered 2019-07-25 – 2019-07-27 (×3): 25 mg via ORAL
  Filled 2019-07-24 (×3): qty 1

## 2019-07-24 MED ORDER — LATANOPROST 0.005 % OP SOLN
1.0000 [drp] | Freq: Every day | OPHTHALMIC | Status: DC
  Administered 2019-07-25 – 2019-07-26 (×3): 1 [drp] via OPHTHALMIC
  Filled 2019-07-24: qty 2.5

## 2019-07-24 MED ORDER — DICLOFENAC SODIUM 1 % EX GEL
2.0000 g | Freq: Four times a day (QID) | CUTANEOUS | Status: DC
  Administered 2019-07-25 – 2019-07-27 (×6): 2 g via TOPICAL
  Filled 2019-07-24: qty 100

## 2019-07-24 MED ORDER — PANTOPRAZOLE SODIUM 40 MG PO TBEC
40.0000 mg | DELAYED_RELEASE_TABLET | Freq: Every day | ORAL | Status: DC
  Administered 2019-07-25 – 2019-07-27 (×3): 40 mg via ORAL
  Filled 2019-07-24 (×3): qty 1

## 2019-07-24 MED ORDER — DORZOLAMIDE HCL-TIMOLOL MAL 2-0.5 % OP SOLN
1.0000 [drp] | Freq: Two times a day (BID) | OPHTHALMIC | Status: DC
  Administered 2019-07-25 – 2019-07-27 (×3): 1 [drp] via OPHTHALMIC
  Filled 2019-07-24: qty 10

## 2019-07-24 MED ORDER — SIMVASTATIN 20 MG PO TABS
20.0000 mg | ORAL_TABLET | Freq: Every day | ORAL | Status: DC
  Administered 2019-07-25 – 2019-07-26 (×2): 20 mg via ORAL
  Filled 2019-07-24 (×3): qty 1

## 2019-07-24 MED ORDER — DOCUSATE SODIUM 100 MG PO CAPS: 100.0000 mg | ORAL_CAPSULE | Freq: Two times a day (BID) | ORAL | Status: DC

## 2019-07-24 MED ORDER — NITROGLYCERIN 0.4 MG SL SUBL
0.4000 mg | SUBLINGUAL_TABLET | Freq: Once | SUBLINGUAL | Status: AC
  Administered 2019-07-24: 0.4 mg via SUBLINGUAL
  Filled 2019-07-24: qty 1

## 2019-07-24 MED ORDER — ASPIRIN EC 81 MG PO TBEC
81.0000 mg | DELAYED_RELEASE_TABLET | Freq: Every day | ORAL | Status: DC
  Administered 2019-07-25 – 2019-07-27 (×3): 81 mg via ORAL
  Filled 2019-07-24 (×3): qty 1

## 2019-07-24 MED ORDER — ASPIRIN 81 MG PO CHEW
324.0000 mg | CHEWABLE_TABLET | Freq: Once | ORAL | Status: AC
  Administered 2019-07-24: 324 mg via ORAL
  Filled 2019-07-24: qty 4

## 2019-07-24 MED ORDER — ACETAMINOPHEN 325 MG PO TABS
650.0000 mg | ORAL_TABLET | ORAL | Status: DC | PRN
  Administered 2019-07-25: 650 mg via ORAL
  Filled 2019-07-24: qty 2

## 2019-07-24 MED ORDER — VITAMIN D 25 MCG (1000 UNIT) PO TABS
2000.0000 [IU] | ORAL_TABLET | Freq: Every day | ORAL | Status: DC
  Administered 2019-07-25 – 2019-07-27 (×3): 2000 [IU] via ORAL
  Filled 2019-07-24 (×3): qty 2

## 2019-07-24 NOTE — Discharge Instructions (Addendum)
Increase the miralax to twice daily until you have a soft bowel movement.   You may alternate taking Tylenol as needed for pain control. You may take [phone] mg of Tylenol every 6 hours. Do not exceed 4000 mg of Tylenol daily as this can lead to liver damage. You may use warm and cold compresses to help with your symptoms.

## 2019-07-24 NOTE — ED Triage Notes (Signed)
Pt reports left sided neck pain that radiates up to her head at times, denies chest pain but is having lots of congestion, states she had CABG done last May and is having intermittent pain since then. Pt a.o, nad noted, hypertensive in triage.

## 2019-07-24 NOTE — H&P (Signed)
History and Physical    Leah Pittman:[id number] DOB: [birth year] DOA: 07/24/2019  PCP: Rochel Brome, MD Patient coming from: Home  Chief Complaint: Neck pain  HPI: Leah Pittman is a 86 y.o. female with medical history significant of multivessel CAD status post CABG in May 2020, hypertension, hyperlipidemia, hypothyroidism, arthritis presenting to the ED with complaints of neck pain.  Patient states she came to the ED to be evaluated for neck pain.  She has had left-sided neck muscle spasms for the past 2 weeks which have been worse for the past 2 days.  Denies any falls or injury to her neck.  States she sat in the waiting area for 4.5 hours to be seen and while still there started experiencing headaches and nausea.  She felt her symptoms are related to her blood pressure being high as this is happened before.  She takes her home blood pressure medication only once or twice a week.  Her husband brought her blood pressure medication from the car and she took it.  A few minutes later as she she was walking to speak to the secretary in the triage area she started feeling substernal chest heaviness and shortness of breath.  States she could barely speak.  States her neck pain has now resolved.  Chest pain and shortness of breath also resolved after she was given sublingual nitroglycerin.  Patient states she is also struggling with constipation for the past 8 days.  She has tried taking MiraLAX and still has not had a bowel movement.  She has continued to feel nauseous but has not vomited.  She is having abdominal cramps.  She is not passing flatus.  ED Course: Blood pressure significantly elevated with systolic up to 956O on arrival.  While in the ED, patient developed chest pain.  Initial high-sensitivity troponin 2, repeat 79.  EKG without acute ischemic changes.  Screening SARS-CoV-2 PCR test pending.  Chest x-ray negative for acute abnormality.  CT C-spine showing mild cervical spondylosis  from C4-5 through C6-7.  No acute bony abnormality.  Patient was given aspirin 324 mg and sublingual nitroglycerin.  Cardiology consulted.  Review of Systems:  All systems reviewed and apart from history of presenting illness, are negative.  Past Medical History:  Diagnosis Date  . Anxiety   . Arthritis   . Chest pain 2011   CARDIOLITE - no significant symptoms, EKG changes, or arrhythmias  . Dyspnea 02/16/2012   2D ECHO - EF 55-60%, normal  . Fatigue   . Glaucoma   . Headache(784.0)   . Hiatal hernia   . High blood pressure 02/16/2012   RENAL DOPPLER - normal  . Hyperlipidemia   . Hypothyroidism   . Labile hypertension   . Lightheadedness   . Migraine headache   . Multiple allergies   . Myalgia   . Pain, lower leg    Calf  . Problem of menstruation   . Reflux   . SOB (shortness of breath)   . Thyroid disease   . Urinary problem     Past Surgical History:  Procedure Laterality Date  . ABDOMINAL HYSTERECTOMY  1976  . CHOLECYSTECTOMY  2000  . CORONARY ARTERY BYPASS GRAFT N/A 07/10/2018   Procedure: CORONARY ARTERY BYPASS GRAFTING (CABG), FREE LIMA;  Surgeon: Gaye Pollack, MD;  Location: [facility];  Service: Open Heart Surgery;  Laterality: N/A;  . CYSTECTOMY  1974   Intestine  . CYSTECTOMY  1996   Brain stem  . EYE  SURGERY  2003  . LEFT HEART CATH AND CORONARY ANGIOGRAPHY N/A 07/08/2018   Procedure: LEFT HEART CATH AND CORONARY ANGIOGRAPHY;  Surgeon: Runell Gess, MD;  Location: [facility];  Service: Cardiovascular;  Laterality: N/A;  . TEE WITHOUT CARDIOVERSION N/A 07/10/2018   Procedure: TRANSESOPHAGEAL ECHOCARDIOGRAM (TEE);  Surgeon: [name], MD;  Location: [facility] OR;  Service: Open Heart Surgery;  Laterality: N/A;     reports that she has never smoked. She has never used smokeless tobacco. She reports that she does not drink alcohol or use drugs.  Allergies  Allergen Reactions  . Benadryl [Diphenhydramine] Swelling and Other (See Comments)     Tongue swells and hallucinates- could not talk  . Codeine Nausea And Vomiting and Hypertension  . Meperidine Nausea Only, Swelling and Other (See Comments)    Tongue swells and "I feel like death"  . Prednisone     Hypertension   . Promethazine Other (See Comments)    Hypotension   . Propranolol Nausea And Vomiting  . Shellfish Allergy Nausea And Vomiting  . Tape Other (See Comments)    Tears the skin  . Tazarotene Other (See Comments)    Reaction not recalled   . Iodinated Diagnostic Agents Nausea Only and Rash    Family History  Problem Relation Age of Onset  . Heart attack Father   . Stroke Father   . Cancer Sister   . Cancer Brother   . Asthma Brother   . Cancer Brother     Prior to Admission medications   Medication Sig Start Date End Date Taking? Authorizing Provider  acetaminophen (TYLENOL) 325 MG tablet Take 975 mg by mouth every 8 (eight) hours as needed for mild pain or headache.  09/29/14  Yes [provider]  aspirin 81 MG tablet Take 81 mg by mouth daily.   Yes [provider]  Calcium Citrate-Vitamin D (CALCIUM CITRATE+D3 PETITES PO) Take 1 tablet by mouth daily.   Yes [provider]  docusate sodium (COLACE) 100 MG capsule Take 100 mg by mouth 2 (two) times daily.   Yes [provider]  dorzolamide-timolol (COSOPT) 22.3-6.8 MG/ML ophthalmic solution Place 1 drop into the left eye 2 (two) times daily.  06/05/12  Yes [provider]  HYDROcodone-acetaminophen (NORCO/VICODIN) 5-325 MG tablet Take 1 tablet by mouth every 6 (six) hours as needed for moderate pain.   Yes [provider]  latanoprost (XALATAN) 0.005 % ophthalmic solution Place 1 drop into the left eye at bedtime. 07/23/19 07/22/20 Yes [provider]  levothyroxine (SYNTHROID) 100 MCG tablet TAKE 1 TABLET BY MOUTH EVERY DAY Patient taking differently: Take 100 mcg by mouth daily.  07/14/19  Yes Marianne Sofia, PA-C  LORazepam (ATIVAN) 1 MG tablet  Take 1 mg by mouth 2 (two) times daily as needed for anxiety.    Yes [provider]  losartan (COZAAR) 25 MG tablet TAKE 1 TABLET(25 MG) BY MOUTH 1 TIME PER DAY Patient taking differently: Take 25 mg by mouth daily.  05/01/19  Yes [name], [name], MD  magnesium oxide (MAG-OX) 400 MG tablet Take 400 mg by mouth daily.   Yes [provider]  nitroGLYCERIN (NITROSTAT) 0.4 MG SL tablet Place 0.4 mg under the tongue every 5 (five) minutes as needed for chest pain.  05/10/18  Yes [provider]  ondansetron (ZOFRAN) 8 MG tablet Take 1 tablet (8 mg total) by mouth 2 (two) times daily as needed for nausea or vomiting. 07/17/19  Yes [name], [name], MD  pantoprazole (PROTONIX) 40 MG tablet Take 1 tablet (40 mg total) by mouth daily. 07/17/19  Yes [name], [name], MD  Probiotic Product (PROBIOTIC DAILY PO) Take 1 capsule by mouth daily.   Yes [provider]  simvastatin (ZOCOR) 20 MG tablet Take 1 tablet (20 mg total) by mouth at bedtime. 05/13/19  Yes [name], [name], MD  Vitamin D, Cholecalciferol, 50 MCG (2000 UT) CAPS Take 1 tablet by mouth daily.   Yes [provider]  ferrous fumarate-b12-vitamic C-folic acid (TRINSICON / FOLTRIN) capsule Take 1 capsule by mouth 2 (two) times daily after a meal. Patient not taking: Reported on 07/24/2019 07/19/18   Barrett, Erin R, PA-C  Vitamin D, Ergocalciferol, (DRISDOL) 1.25 MG (50000 UNIT) CAPS capsule TAKE 1 CAPSULE BY MOUTH 1 TIME WEEKLY Patient not taking: No sig reported 04/23/19   Blane Ohara, MD    Physical Exam: Vitals:   07/24/19 2115 07/24/19 2130 07/24/19 2245 07/24/19 2315  BP: (!) 156/60 (!) 153/55 130/64 (!) 156/65  Pulse: (!) 58 (!) 55 63 (!) 59  Resp: 14 20 14 15   Temp:      TempSrc:      SpO2:      Weight:      Height:        Physical Exam  Constitutional: She is oriented to person, place, and time. She appears well-developed and well-nourished. No distress.  HENT:  Head: Normocephalic.  C-spine nontender  to palpation Muscles on the left slightly tender to palpation Range of motion normal  Eyes: Right eye exhibits no discharge. Left eye exhibits no discharge.  Cardiovascular: Normal rate, regular rhythm and intact distal pulses.  Pulmonary/Chest: Effort normal and breath sounds normal. No respiratory distress. She has no wheezes. She has no rales.  Abdominal: Soft. Bowel sounds are normal. She exhibits no distension. There is abdominal tenderness. There is no rebound and no guarding.  Mild generalized tenderness to palpation  Musculoskeletal:        General: No edema.     Cervical back: Neck supple.  Neurological: She is alert and oriented to person, place, and time.  Skin: Skin is warm and dry. She is not diaphoretic.  Psychiatric:  Appears anxious    Labs on Admission: I have personally reviewed following labs and imaging studies  CBC: Recent Labs  Lab 07/24/19 1351  WBC 7.5  HGB 12.6  HCT 39.1  MCV 95.8  PLT 256   Basic Metabolic Panel: Recent Labs  Lab 07/24/19 1351  NA 137  K 4.4  CL 104  CO2 21*  GLUCOSE 96  BUN 10  CREATININE 0.78  CALCIUM 10.2   GFR: Estimated Creatinine Clearance: 42.6 mL/min (by C-G formula based on SCr of 0.78 mg/dL). Liver Function Tests: No results for input(s): AST, ALT, ALKPHOS, BILITOT, PROT, ALBUMIN in the last 168 hours. No results for input(s): LIPASE, AMYLASE in the last 168 hours. No results for input(s): AMMONIA in the last 168 hours. Coagulation Profile: No results for input(s): INR, PROTIME in the last 168 hours. Cardiac Enzymes: No results for input(s): CKTOTAL, CKMB, CKMBINDEX, TROPONINI in the last 168 hours. BNP (last 3 results) No results for input(s): PROBNP in the last 8760 hours. HbA1C: No results for input(s): HGBA1C in the last 72 hours. CBG: No results for input(s): GLUCAP in the last 168 hours. Lipid Profile: No results for input(s): CHOL, HDL, LDLCALC, TRIG, CHOLHDL, LDLDIRECT in the last 72  hours. Thyroid Function Tests: No results for  input(s): TSH, T4TOTAL, FREET4, T3FREE, THYROIDAB in the last 72 hours. Anemia Panel: No results for input(s): VITAMINB12, FOLATE, FERRITIN, TIBC, IRON, RETICCTPCT in the last 72 hours. Urine analysis:    Component Value Date/Time   COLORURINE STRAW (A) 07/09/2018 1400   APPEARANCEUR CLEAR 07/09/2018 1400   LABSPEC 1.010 07/09/2018 1400   PHURINE 6.0 07/09/2018 1400   GLUCOSEU NEGATIVE 07/09/2018 1400   HGBUR SMALL (A) 07/09/2018 1400   BILIRUBINUR NEGATIVE 07/09/2018 1400   KETONESUR NEGATIVE 07/09/2018 1400   PROTEINUR NEGATIVE 07/09/2018 1400   NITRITE NEGATIVE 07/09/2018 1400   LEUKOCYTESUR NEGATIVE 07/09/2018 1400    Radiological Exams on Admission: DG Chest 2 View  Result Date: 07/24/2019 CLINICAL DATA:  Chest pain and neck pain radiating to behind eyes for 4 days, history hypertension, COVID-19 EXAM: CHEST - 2 VIEW COMPARISON:  01/02/2019 FINDINGS: Normal heart size post CABG. Mediastinal contours and pulmonary vascularity normal. Atherosclerotic calcification aorta. Tiny calcified granuloma mid RIGHT lung. Hyperinflation and bronchitic changes consistent with COPD. No acute infiltrate, pleural effusion or pneumothorax. Osseous structures unremarkable. IMPRESSION: Post CABG. COPD changes. No acute abnormalities. Electronically Signed   By: Ulyses Southward M.D.   On: 07/24/2019 14:33   CT Cervical Spine Wo Contrast  Result Date: 07/24/2019 CLINICAL DATA:  Left-sided neck pain radiating to head EXAM: CT CERVICAL SPINE WITHOUT CONTRAST TECHNIQUE: Multidetector CT imaging of the cervical spine was performed without intravenous contrast. Multiplanar CT image reconstructions were also generated. COMPARISON:  11/14/2016 FINDINGS: Alignment: Alignment is anatomic. Skull base and vertebrae: There are no acute displaced fractures. Soft tissues and spinal canal: No prevertebral fluid or swelling. No visible canal hematoma. Disc levels: There is mild  multilevel cervical spondylosis, with disc space narrowing most pronounced at C4-5, C5-6, and C6-7. Mild right predominant uncovertebral hypertrophy at C5-6 and C6-7 results in minimal right neural foraminal encroachment. Central canal remains patent. Upper chest: Airway is patent.  Lung apices are clear. Other: Reconstructed images demonstrate no additional findings. IMPRESSION: 1. Mild cervical spondylosis from C4-5 through C6-7. 2. No acute bony abnormality. Electronically Signed   By: Sharlet Salina M.D.   On: 07/24/2019 20:01    EKG: Independently reviewed.  Sinus rhythm, no acute ischemic changes.  Assessment/Plan Principal Problem:   Chest pain Active Problems:   Anxiety   Hypercholesterolemia   Hypertensive emergency   Neck pain   Chest pain in the setting of known multivessel CAD status post CABG in May 2020: Initial high-sensitivity troponin 2, repeat 79.  EKG without acute ischemic changes. Troponin elevation possibly due to demand ischemia from hypertensive emergency as blood pressure was significantly elevated on arrival.  However, her symptoms are concerning for angina.  Chest pain resolved after receiving sublingual nitroglycerin. -Cardiac monitoring.  Patient was given full dose aspirin in the ED. Heparin drip ordered by cardiology.  Sublingual nitroglycerin as needed.  Trend troponin.  Order echocardiogram.  Cardiology will consult tonight, appreciate recommendations.  Hypertensive emergency: Likely related to medication noncompliance.  She is currently only on losartan and reports taking the medication only once or twice a week.  Blood pressure significantly elevated with systolic up to 230s on arrival.  Systolic now down to 130s after patient took her home Losartan and received SL nitro for CP.  -Continue home medication.  Hydralazine as needed for SBP >160.  Neck pain: Likely due to muscle spasms.  CT C-spine showing mild cervical spondylosis from C4-5 through C6-7.  No acute  bony abnormality.  Currently not endorsing neck  pain and range of motion normal. -Tylenol as needed for pain, Voltaren gel, heating pad  Constipation: Patient reports no bowel movements and nausea for the past 8 days.  No vomiting.  Has mild generalized tenderness on palpation but no abdominal distention on exam.  Bowel sounds present. -KUB ordered.  If no signs of bowel obstruction, give laxatives.  Antiemetic as needed for nausea.  Hyperlipidemia -Continue home Zocor  Hypothyroidism -Continue home Synthroid  GERD -Continue PPI  Anxiety -Continue home Ativan as needed  DVT prophylaxis: Heparin Code Status: Patient wishes to be DNR. Family Communication: No family available at this time. Disposition Plan: Status is: Observation  The patient remains OBS appropriate and will d/c before 2 midnights.  Dispo: The patient is from: Home              Anticipated d/c is to: Home              Anticipated d/c date is: 1 day              Patient currently is not medically stable to d/c.  The medical decision making on this patient was of high complexity and the patient is at high risk for clinical deterioration, therefore this is a level 3 visit.  John GiovanniVasundhra Erasto Sleight MD [facility]  If 7PM-7AM, please contact night-coverage [email or web address]  07/24/2019, 11:37 PM

## 2019-07-24 NOTE — ED Provider Notes (Signed)
[facility] EMERGENCY DEPARTMENT Provider Note   CSN: [id number] Arrival date & time: 07/24/19  1335     History Chief Complaint  Patient presents with  . Chest Pain  . Neck Pain    Leah Pittman is a 86 y.o. female.  HPI     HPI  Pt is an 86 y/o female with a h/o anxiety, arthritis, glaucoma, hiatal hernia, HTN, HLD, hypothyroidism, who presents to the ED today for eval of neck pain. She also developed chest pain in the waiting room.   Chest pain: Pt c/o mid to left chest pain that has been intermittent since she had a CABG last May 2020. However since waiting in the waiting room her pain feels different. Describes pain as a dull ache and heaviness. She reports associated shortness of breath and a headache. States she feels like her blood pressure was becoming elevated. She did not take her BP meds today. She reports she has had a some chest congestion and a productive cough that is chronic in the mornings.    Neck pain: C/o left anterior neck pain starting a few days ago. She has intermittent chronic pain to the posterior aspect of the neck and gets intermittent spasms. Pain has been intermittent since onset. Pain intermittently feels sharp and radiates up behind the left ear. Pain has improved since being in the waiting room. Pt did not have a headache until coming to the ED. States when her neck hurts she feels like her vision gets worse. Denies any new numbness or weakness. She has chronic intermittent dizziness/lightheadedness and states it might be a little worse today.   She further states she has been constipated and has been taking miralax for the last 4 days.   Past Medical History:  Diagnosis Date  . Anxiety   . Arthritis   . Chest pain 2011   CARDIOLITE - no significant symptoms, EKG changes, or arrhythmias  . Dyspnea 02/16/2012   2D ECHO - EF 55-60%, normal  . Fatigue   . Glaucoma   . Headache(784.0)   . Hiatal hernia   . High blood  pressure 02/16/2012   RENAL DOPPLER - normal  . Hyperlipidemia   . Hypothyroidism   . Labile hypertension   . Lightheadedness   . Migraine headache   . Multiple allergies   . Myalgia   . Pain, lower leg    Calf  . Problem of menstruation   . Reflux   . SOB (shortness of breath)   . Thyroid disease   . Urinary problem     Patient Active Problem List   Diagnosis Date Noted  . S/P CABG x 4 07/10/2018  . Coronary artery disease involving native coronary artery of native heart without angina pectoris 07/08/2018  . Essential hypertension 08/14/2017  . Hypercholesterolemia 11/30/2015  . Precordial chest pain 01/13/2015  . Chest pain with high risk for cardiac etiology 12/12/2013  . Glaucoma 09/29/2012  . Anxiety   . Labile hypertension   . Migraine headache     Past Surgical History:  Procedure Laterality Date  . ABDOMINAL HYSTERECTOMY  1976  . CHOLECYSTECTOMY  2000  . CORONARY ARTERY BYPASS GRAFT N/A 07/10/2018   Procedure: CORONARY ARTERY BYPASS GRAFTING (CABG), FREE LIMA;  Surgeon: [name], MD;  Location: [facility] OR;  Service: Open Heart Surgery;  Laterality: N/A;  . CYSTECTOMY  1974   Intestine  . CYSTECTOMY  1996   Brain stem  . EYE SURGERY  2003  . LEFT HEART CATH AND CORONARY ANGIOGRAPHY N/A 07/08/2018   Procedure: LEFT HEART CATH AND CORONARY ANGIOGRAPHY;  Surgeon: Runell GessBerry, Jonathan J, MD;  Location: [facility] INVASIVE CV LAB;  Service: Cardiovascular;  Laterality: N/A;  . TEE WITHOUT CARDIOVERSION N/A 07/10/2018   Procedure: TRANSESOPHAGEAL ECHOCARDIOGRAM (TEE);  Surgeon: Alleen BorneBartle, Bryan K, MD;  [facility];  Service: Open Heart Surgery;  Laterality: N/A;     OB History   No obstetric history on file.     Family History  Problem Relation Age of Onset  . Heart attack Father   . Stroke Father   . Cancer Sister   . Cancer Brother   . Asthma Brother   . Cancer Brother     Social History   Tobacco Use  . Smoking status: Never Smoker  . Smokeless tobacco:  Never Used  Substance Use Topics  . Alcohol use: No  . Drug use: No    Home Medications Prior to Admission medications   Medication Sig Start Date End Date Taking? Authorizing Provider  acetaminophen (TYLENOL) 325 MG tablet Take 975 mg by mouth every 8 (eight) hours as needed for mild pain or headache.  09/29/14  Yes [provider]  aspirin 81 MG tablet Take 81 mg by mouth daily.   Yes [provider]  Calcium Citrate-Vitamin D (CALCIUM CITRATE+D3 PETITES PO) Take 1 tablet by mouth daily.   Yes [provider]  docusate sodium (COLACE) 100 MG capsule Take 100 mg by mouth 2 (two) times daily.   Yes [provider]  dorzolamide-timolol (COSOPT) 22.3-6.8 MG/ML ophthalmic solution Place 1 drop into the left eye 2 (two) times daily.  06/05/12  Yes [provider]  HYDROcodone-acetaminophen (NORCO/VICODIN) 5-325 MG tablet Take 1 tablet by mouth every 6 (six) hours as needed for moderate pain.   Yes [provider]  latanoprost (XALATAN) 0.005 % ophthalmic solution Place 1 drop into the left eye at bedtime. 07/23/19 07/22/20 Yes [provider]  levothyroxine (SYNTHROID) 100 MCG tablet TAKE 1 TABLET BY MOUTH EVERY DAY Patient taking differently: Take 100 mcg by mouth daily.  07/14/19  Yes Marianne Sofiaavis, Sara, PA-C  LORazepam (ATIVAN) 1 MG tablet Take 1 mg by mouth 2 (two) times daily as needed for anxiety.    Yes [provider]  losartan (COZAAR) 25 MG tablet TAKE 1 TABLET(25 MG) BY MOUTH 1 TIME PER DAY Patient taking differently: Take 25 mg by mouth daily.  05/01/19  Yes [name], [name], MD  magnesium oxide (MAG-OX) 400 MG tablet Take 400 mg by mouth daily.   Yes [provider]  nitroGLYCERIN (NITROSTAT) 0.4 MG SL tablet Place 0.4 mg under the tongue every 5 (five) minutes as needed for chest pain.  05/10/18  Yes [provider]  ondansetron (ZOFRAN) 8 MG tablet Take 1 tablet (8 mg total) by mouth 2 (two) times daily as needed  for nausea or vomiting. 07/17/19  Yes [name], [name], MD  pantoprazole (PROTONIX) 40 MG tablet Take 1 tablet (40 mg total) by mouth daily. 07/17/19  Yes [name], [name], MD  Probiotic Product (PROBIOTIC DAILY PO) Take 1 capsule by mouth daily.   Yes [provider]  simvastatin (ZOCOR) 20 MG tablet Take 1 tablet (20 mg total) by mouth at bedtime. 05/13/19  Yes [name], [name], MD  Vitamin D, Cholecalciferol, 50 MCG (2000 UT) CAPS Take 1 tablet by mouth daily.   Yes [provider]  ferrous fumarate-b12-vitamic C-folic acid (TRINSICON / FOLTRIN) capsule Take  1 capsule by mouth 2 (two) times daily after a meal. Patient not taking: Reported on 07/24/2019 07/19/18   Barrett, Erin R, PA-C  Vitamin D, Ergocalciferol, (DRISDOL) 1.25 MG (50000 UNIT) CAPS capsule TAKE 1 CAPSULE BY MOUTH 1 TIME WEEKLY Patient not taking: No sig reported 04/23/19   CoxFritzi Mandes, MD    Allergies    Benadryl [diphenhydramine], Codeine, Meperidine, Prednisone, Promethazine, Propranolol, Shellfish allergy, Tape, Tazarotene, and Iodinated diagnostic agents  Review of Systems   Review of Systems Constitutional: Negative for fever.  HENT: Negative for sore throat.  Eyes: Negative for visual disturbance.  Respiratory: Negative for cough and shortness of breath.  Cardiovascular: Negative for chest pain.  Gastrointestinal: Positive for constipation. Negative for diarrhea, nausea and vomiting.  Genitourinary: Negative for dysuria and hematuria.  No loss of control of bowel or bladder function.  Musculoskeletal: Positive for neck pain. Negative for back pain.  Skin: Negative for color change and rash.  Neurological: Positive for dizziness (chronic), light-headedness (chronic) and headaches. Negative for seizures, syncope, weakness and numbness.  All other systems reviewed and are negative.   Physical Exam Updated Vital Signs BP (!) 156/60   Pulse (!) 58   Temp 98.3 F (36.8 C) (Oral)   Resp 14   Ht 5\' 4"  (1.626 m)    Wt 53.5 kg   SpO2 100%   BMI 20.25 kg/m   Physical Exam  Constitutional:  General: She is not in acute distress. Appearance: She is well-developed.  HENT:  Head: Normocephalic and atraumatic.  Eyes:  Conjunctiva/sclera: Conjunctivae normal.  Neck:  Comments: No carotid bruit bilaterally Cardiovascular:  Rate and Rhythm: Normal rate and regular rhythm.  Heart sounds: No murmur.  Pulmonary:  Effort: Pulmonary effort is normal. No respiratory distress.  Breath sounds: Normal breath sounds. No decreased breath sounds, wheezing, rhonchi or rales.  Chest:  Chest wall: Tenderness (anterolateral chest ttp) present.  Abdominal:  Palpations: Abdomen is soft.  Comments: Mild diffuse ttp w/o peritoneal signs  Genitourinary:  Comments: dre performed with chaperone present. Light brown stool noted in the rectal vault. No impaction. No melena or bright red blood. Musculoskeletal:  Cervical back: Neck supple.  Right lower leg: No tenderness. No edema.  Left lower leg: No tenderness. No edema.  Comments: midline cervical spine TTP presents with TTP to the left cervical paraspinous muscles and trapezius. No anterior neck tenderness.  Skin:  General: Skin is warm and dry.  Neurological:  Mental Status: She is alert.  Comments: Mental Status:  Alert, thought content appropriate, able to give a coherent history. Speech fluent without evidence of aphasia. Able to follow 2 step commands without difficulty.  Cranial Nerves II-XI intact. Motor:  Normal tone. 5/5 strength of BUE and BLE major muscle groups including strong and equal grip strength and dorsiflexion/plantar flexion Sensory: light touch normal in all extremities. CV: 2+ DP pulses   ED Results / Procedures / Treatments   Labs (all labs ordered are listed, but only abnormal results are displayed) Labs Reviewed  BASIC METABOLIC PANEL - Abnormal; Notable for the following components:      Result Value   CO2 21 (*)    All other  components within normal limits  TROPONIN I (HIGH SENSITIVITY) - Abnormal; Notable for the following components:   Troponin I (High Sensitivity) 79 (*)    All other components within normal limits  SARS CORONAVIRUS 2 BY RT PCR (HOSPITAL ORDER, PERFORMED IN [facility])  CBC  POC OCCULT BLOOD,  ED  TROPONIN I (HIGH SENSITIVITY)    EKG EKG Interpretation  Date/Time:  Thursday July 24 2019 13:34:17 EDT Ventricular Rate:  67 PR Interval:  156 QRS Duration: 70 QT Interval:  400 QTC Calculation: 422 R Axis:   77 Text Interpretation: Normal sinus rhythm Nonspecific ST abnormality Abnormal ECG since last tracing no significant change Confirmed by Mancel Bale [phone]) on 07/24/2019 7:07:46 PM   Radiology DG Chest 2 View  Result Date: 07/24/2019 CLINICAL DATA:  Chest pain and neck pain radiating to behind eyes for 4 days, history hypertension, COVID-19 EXAM: CHEST - 2 VIEW COMPARISON:  01/02/2019 FINDINGS: Normal heart size post CABG. Mediastinal contours and pulmonary vascularity normal. Atherosclerotic calcification aorta. Tiny calcified granuloma mid RIGHT lung. Hyperinflation and bronchitic changes consistent with COPD. No acute infiltrate, pleural effusion or pneumothorax. Osseous structures unremarkable. IMPRESSION: Post CABG. COPD changes. No acute abnormalities. Electronically Signed   By: Ulyses Southward M.D.   On: 07/24/2019 14:33   CT Cervical Spine Wo Contrast  Result Date: 07/24/2019 CLINICAL DATA:  Left-sided neck pain radiating to head EXAM: CT CERVICAL SPINE WITHOUT CONTRAST TECHNIQUE: Multidetector CT imaging of the cervical spine was performed without intravenous contrast. Multiplanar CT image reconstructions were also generated. COMPARISON:  11/14/2016 FINDINGS: Alignment: Alignment is anatomic. Skull base and vertebrae: There are no acute displaced fractures. Soft tissues and spinal canal: No prevertebral fluid or swelling. No visible canal hematoma. Disc levels: There  is mild multilevel cervical spondylosis, with disc space narrowing most pronounced at C4-5, C5-6, and C6-7. Mild right predominant uncovertebral hypertrophy at C5-6 and C6-7 results in minimal right neural foraminal encroachment. Central canal remains patent. Upper chest: Airway is patent.  Lung apices are clear. Other: Reconstructed images demonstrate no additional findings. IMPRESSION: 1. Mild cervical spondylosis from C4-5 through C6-7. 2. No acute bony abnormality. Electronically Signed   By: Sharlet Salina M.D.   On: 07/24/2019 20:01    Procedures Procedures (including critical care time)  Medications Ordered in ED Medications  aspirin chewable tablet 324 mg (324 mg Oral Given 07/24/19 1919)    ED Course  I have reviewed the triage vital signs and the nursing notes.  Pertinent labs & imaging results that were available during my care of the patient were reviewed by me and considered in my medical decision making (see chart for details).    MDM Rules/Calculators/A&P                      86 year old female presenting for evaluation of left-sided neck pain.  History of same back pain is more anterior today.  On my evaluation pain is actually improved since she has been waiting in the waiting room.  In the waiting room she did develop some left-sided chest pain.  Has chronic chest pain from prior CABG with daily coughing spells.  Pain felt little bit different while in the waiting room.  Also complaining of some constipation.  She took her BP meds while in the waiting room.  Patient blood pressure was initially significantly elevated however has started to decline since taking her blood pressure medications in the waiting room.  Has some chest wall tenderness on exam and also some neck tenderness on exam.  She is neuro intact. abd soft and without peritoneal signs, no evidence of fecal impaction on rectal exam.  Reviewed/interpreted labs CBC nonacute BMP nonacute Initial trop neg, delta  trop is elevated at 79  - will consult cardiology  EKG with NSR  Nonspecific ST abnormality Abnormal ECG since last tracing no significant change  Repeat EKG is unchanged  Imaging reviewed/interpreted  CXR - Post CABG. COPD changes. No acute abnormalities. CT cervical spine -  With mild cervical spondylosis from C4-5 through C6-7. 2. No acute bony abnormality.  At shift change, care transitioned to Dr. Eulis Foster who will f/u on cardiology consult.   Final Clinical Impression(s) / ED Diagnoses Final diagnoses:  Cervical spondylosis  Atypical chest pain  Constipation, unspecified constipation type    Rx / DC Orders ED Discharge Orders    None       Bishop Dublin 07/24/19 2213    Daleen Bo, MD 07/26/19 [phone]

## 2019-07-24 NOTE — ED Notes (Signed)
Pt transported to CT ?

## 2019-07-24 NOTE — ED Provider Notes (Signed)
  Face-to-face evaluation   History: She presents for multiple complaints.  She has been having on and off neck pain for years, for the last several days, she has had more left lateral to anterior neck pain than usual.  This movement is typically movement related also, since arriving here and being in the waiting room she developed chest pain.  She states that she coughs frequently and has strained a muscle, when she coughed this morning.  She also been constipated for a week.  Physical exam: Elderly patient who is alert.  She is moderately anxious.  She has reasonable range of motion of the neck without pain.  Chest is tender anterior.  Heart has regular rate and rhythm without murmur.  Lungs are clear anteriorly and posteriorly.  Abdomen soft and nondistended and nontender.  11:00 PM-cardiology return call, Dr. Mackie Pai will see the patient as a consult and request medical admission.  Suspect demand ischemia raising troponin.  11:25 PM-case discussed with hospitalist, Dr. Loney Loh, who will see the patient for admission.  Medical screening examination/treatment/procedure(s) were conducted as a shared visit with non-physician practitioner(s) and myself.  I personally evaluated the patient during the encounter    Mancel Bale, MD 07/26/19 [phone]

## 2019-07-25 ENCOUNTER — Inpatient Hospital Stay (HOSPITAL_COMMUNITY)
Admission: EM | Disposition: A | Discharge status: Home Health | Payer: Self-pay | Source: Home / Self Care | Attending: Internal Medicine

## 2019-07-25 ENCOUNTER — Observation Stay (HOSPITAL_BASED_OUTPATIENT_CLINIC_OR_DEPARTMENT_OTHER): Payer: Medicare Other

## 2019-07-25 ENCOUNTER — Observation Stay (HOSPITAL_COMMUNITY): Payer: Medicare Other

## 2019-07-25 DIAGNOSIS — Z885 Allergy status to narcotic agent status: Secondary | ICD-10-CM | POA: Diagnosis not present

## 2019-07-25 DIAGNOSIS — I208 Other forms of angina pectoris: Secondary | ICD-10-CM | POA: Diagnosis not present

## 2019-07-25 DIAGNOSIS — R072 Precordial pain: Secondary | ICD-10-CM

## 2019-07-25 DIAGNOSIS — Z66 Do not resuscitate: Secondary | ICD-10-CM | POA: Diagnosis present

## 2019-07-25 DIAGNOSIS — E039 Hypothyroidism, unspecified: Secondary | ICD-10-CM | POA: Diagnosis present

## 2019-07-25 DIAGNOSIS — K5900 Constipation, unspecified: Secondary | ICD-10-CM | POA: Diagnosis present

## 2019-07-25 DIAGNOSIS — E876 Hypokalemia: Secondary | ICD-10-CM | POA: Diagnosis present

## 2019-07-25 DIAGNOSIS — M47812 Spondylosis without myelopathy or radiculopathy, cervical region: Secondary | ICD-10-CM | POA: Diagnosis present

## 2019-07-25 DIAGNOSIS — I2510 Atherosclerotic heart disease of native coronary artery without angina pectoris: Secondary | ICD-10-CM

## 2019-07-25 DIAGNOSIS — K219 Gastro-esophageal reflux disease without esophagitis: Secondary | ICD-10-CM | POA: Diagnosis present

## 2019-07-25 DIAGNOSIS — Z888 Allergy status to other drugs, medicaments and biological substances status: Secondary | ICD-10-CM | POA: Diagnosis not present

## 2019-07-25 DIAGNOSIS — Z20822 Contact with and (suspected) exposure to covid-19: Secondary | ICD-10-CM | POA: Diagnosis present

## 2019-07-25 DIAGNOSIS — I10 Essential (primary) hypertension: Secondary | ICD-10-CM | POA: Diagnosis present

## 2019-07-25 DIAGNOSIS — Z91013 Allergy to seafood: Secondary | ICD-10-CM | POA: Diagnosis not present

## 2019-07-25 DIAGNOSIS — Z8249 Family history of ischemic heart disease and other diseases of the circulatory system: Secondary | ICD-10-CM | POA: Diagnosis not present

## 2019-07-25 DIAGNOSIS — Z9049 Acquired absence of other specified parts of digestive tract: Secondary | ICD-10-CM | POA: Diagnosis not present

## 2019-07-25 DIAGNOSIS — Z91041 Radiographic dye allergy status: Secondary | ICD-10-CM | POA: Diagnosis not present

## 2019-07-25 DIAGNOSIS — F419 Anxiety disorder, unspecified: Secondary | ICD-10-CM | POA: Diagnosis present

## 2019-07-25 DIAGNOSIS — I214 Non-ST elevation (NSTEMI) myocardial infarction: Secondary | ICD-10-CM | POA: Diagnosis present

## 2019-07-25 DIAGNOSIS — R778 Other specified abnormalities of plasma proteins: Secondary | ICD-10-CM

## 2019-07-25 DIAGNOSIS — Y92009 Unspecified place in unspecified non-institutional (private) residence as the place of occurrence of the external cause: Secondary | ICD-10-CM | POA: Diagnosis not present

## 2019-07-25 DIAGNOSIS — Z951 Presence of aortocoronary bypass graft: Secondary | ICD-10-CM | POA: Diagnosis not present

## 2019-07-25 DIAGNOSIS — R079 Chest pain, unspecified: Secondary | ICD-10-CM

## 2019-07-25 DIAGNOSIS — H409 Unspecified glaucoma: Secondary | ICD-10-CM | POA: Diagnosis present

## 2019-07-25 DIAGNOSIS — D72829 Elevated white blood cell count, unspecified: Secondary | ICD-10-CM | POA: Diagnosis present

## 2019-07-25 DIAGNOSIS — R110 Nausea: Secondary | ICD-10-CM | POA: Diagnosis present

## 2019-07-25 DIAGNOSIS — E785 Hyperlipidemia, unspecified: Secondary | ICD-10-CM | POA: Diagnosis present

## 2019-07-25 DIAGNOSIS — I161 Hypertensive emergency: Secondary | ICD-10-CM | POA: Diagnosis present

## 2019-07-25 DIAGNOSIS — Z90710 Acquired absence of both cervix and uterus: Secondary | ICD-10-CM | POA: Diagnosis not present

## 2019-07-25 DIAGNOSIS — E7800 Pure hypercholesterolemia, unspecified: Secondary | ICD-10-CM | POA: Diagnosis present

## 2019-07-25 HISTORY — PX: LEFT HEART CATH AND CORONARY ANGIOGRAPHY: CATH118249

## 2019-07-25 LAB — CBC
HCT: 38.3 % (ref 36.0–46.0)
Hemoglobin: 12.9 g/dL (ref 12.0–15.0)
MCH: 31.9 pg (ref 26.0–34.0)
MCHC: 33.7 g/dL (ref 30.0–36.0)
MCV: 94.6 fL (ref 80.0–100.0)
Platelets: 240 10*3/uL (ref 150–400)
RBC: 4.05 MIL/uL (ref 3.87–5.11)
RDW: 13.9 % (ref 11.5–15.5)
WBC: 7.5 10*3/uL (ref 4.0–10.5)
nRBC: 0 % (ref 0.0–0.2)

## 2019-07-25 LAB — ECHOCARDIOGRAM COMPLETE
Height: 64 in
Weight: 1834.23 oz

## 2019-07-25 LAB — TROPONIN I (HIGH SENSITIVITY): Troponin I (High Sensitivity): 459 ng/L (ref <18)

## 2019-07-25 LAB — HEPARIN LEVEL (UNFRACTIONATED): Heparin Unfractionated: 0.6 IU/mL (ref 0.30–0.70)

## 2019-07-25 SURGERY — LEFT HEART CATH AND CORONARY ANGIOGRAPHY
Anesthesia: LOCAL

## 2019-07-25 MED ORDER — SODIUM CHLORIDE 0.9% FLUSH: 3.0000 mL | INTRAVENOUS | Status: DC | PRN

## 2019-07-25 MED ORDER — HEPARIN (PORCINE) IN NACL 1000-0.9 UT/500ML-% IV SOLN
INTRAVENOUS | Status: AC
  Filled 2019-07-25: qty 1000

## 2019-07-25 MED ORDER — IOHEXOL 350 MG/ML SOLN
INTRAVENOUS | Status: AC
  Filled 2019-07-25: qty 1

## 2019-07-25 MED ORDER — VERAPAMIL HCL 2.5 MG/ML IV SOLN
INTRAVENOUS | Status: AC
  Filled 2019-07-25: qty 2

## 2019-07-25 MED ORDER — LIDOCAINE HCL (PF) 1 % IJ SOLN
INTRAMUSCULAR | Status: AC
  Filled 2019-07-25: qty 30

## 2019-07-25 MED ORDER — OXYCODONE HCL 5 MG PO TABS: 5.0000 mg | ORAL_TABLET | Freq: Four times a day (QID) | ORAL | Status: DC | PRN

## 2019-07-25 MED ORDER — SODIUM CHLORIDE 0.9 % IV SOLN: 250.0000 mL | INTRAVENOUS | Status: DC | PRN

## 2019-07-25 MED ORDER — DIPHENHYDRAMINE HCL 50 MG/ML IJ SOLN: 50.0000 mg | Freq: Once | INTRAMUSCULAR | Status: DC

## 2019-07-25 MED ORDER — ASPIRIN 81 MG PO CHEW
81.0000 mg | CHEWABLE_TABLET | ORAL | Status: DC
Start: 2019-07-26 — End: 2019-07-25

## 2019-07-25 MED ORDER — SODIUM CHLORIDE 0.9% FLUSH
3.0000 mL | Freq: Two times a day (BID) | INTRAVENOUS | Status: DC
  Administered 2019-07-26 – 2019-07-27 (×2): 3 mL via INTRAVENOUS

## 2019-07-25 MED ORDER — FENTANYL CITRATE (PF) 100 MCG/2ML IJ SOLN
INTRAMUSCULAR | Status: AC
  Filled 2019-07-25: qty 2

## 2019-07-25 MED ORDER — DIPHENHYDRAMINE HCL 25 MG PO CAPS: 50.0000 mg | ORAL_CAPSULE | Freq: Once | ORAL | Status: DC

## 2019-07-25 MED ORDER — FENTANYL CITRATE (PF) 100 MCG/2ML IJ SOLN
INTRAMUSCULAR | Status: DC | PRN
  Administered 2019-07-25 (×2): 25 ug via INTRAVENOUS

## 2019-07-25 MED ORDER — SODIUM CHLORIDE 0.9 % IV SOLN: INTRAVENOUS | Status: AC

## 2019-07-25 MED ORDER — SODIUM CHLORIDE 0.9 % WEIGHT BASED INFUSION: 1.0000 mL/kg/h | INTRAVENOUS | Status: DC

## 2019-07-25 MED ORDER — HEPARIN SODIUM (PORCINE) 1000 UNIT/ML IJ SOLN
INTRAMUSCULAR | Status: AC
  Filled 2019-07-25: qty 1

## 2019-07-25 MED ORDER — ONDANSETRON HCL 4 MG/2ML IJ SOLN: 4.0000 mg | Freq: Four times a day (QID) | INTRAMUSCULAR | Status: DC | PRN

## 2019-07-25 MED ORDER — SODIUM CHLORIDE 0.9 % WEIGHT BASED INFUSION
3.0000 mL/kg/h | INTRAVENOUS | Status: DC
  Administered 2019-07-25: 3 mL/kg/h via INTRAVENOUS

## 2019-07-25 MED ORDER — HYDRALAZINE HCL 20 MG/ML IJ SOLN: 10.0000 mg | INTRAMUSCULAR | Status: AC | PRN

## 2019-07-25 MED ORDER — MIDAZOLAM HCL 2 MG/2ML IJ SOLN
INTRAMUSCULAR | Status: AC
  Filled 2019-07-25: qty 2

## 2019-07-25 MED ORDER — HEPARIN BOLUS VIA INFUSION
3000.0000 [IU] | Freq: Once | INTRAVENOUS | Status: AC
  Administered 2019-07-25: 3000 [IU] via INTRAVENOUS
  Filled 2019-07-25: qty 3000

## 2019-07-25 MED ORDER — SODIUM CHLORIDE 0.9 % WEIGHT BASED INFUSION: 3.0000 mL/kg/h | INTRAVENOUS | Status: DC

## 2019-07-25 MED ORDER — HYDROCORTISONE NA SUCCINATE PF 250 MG IJ SOLR
200.0000 mg | Freq: Once | INTRAMUSCULAR | Status: AC
  Administered 2019-07-25: 200 mg via INTRAVENOUS
  Filled 2019-07-25: qty 200

## 2019-07-25 MED ORDER — HEPARIN (PORCINE) 25000 UT/250ML-% IV SOLN
600.0000 [IU]/h | INTRAVENOUS | Status: DC
  Administered 2019-07-25: 600 [IU]/h via INTRAVENOUS
  Filled 2019-07-25 (×2): qty 250

## 2019-07-25 MED ORDER — MIDAZOLAM HCL 2 MG/2ML IJ SOLN
INTRAMUSCULAR | Status: DC | PRN
  Administered 2019-07-25 (×2): 1 mg via INTRAVENOUS

## 2019-07-25 MED ORDER — IOHEXOL 350 MG/ML SOLN
INTRAVENOUS | Status: DC | PRN
  Administered 2019-07-25: 70 mL

## 2019-07-25 MED ORDER — SODIUM CHLORIDE 0.9% FLUSH: 3.0000 mL | Freq: Two times a day (BID) | INTRAVENOUS | Status: DC

## 2019-07-25 MED ORDER — LIDOCAINE HCL (PF) 1 % IJ SOLN
INTRAMUSCULAR | Status: DC | PRN
  Administered 2019-07-25: 2 mL via INTRADERMAL
  Administered 2019-07-25: 15 mL via INTRADERMAL

## 2019-07-25 MED ORDER — VERAPAMIL HCL 2.5 MG/ML IV SOLN
INTRAVENOUS | Status: DC | PRN
  Administered 2019-07-25: 9 mL via INTRA_ARTERIAL

## 2019-07-25 MED ORDER — LACTULOSE 10 GM/15ML PO SOLN
30.0000 g | Freq: Every day | ORAL | Status: DC
  Administered 2019-07-25 – 2019-07-26 (×2): 30 g via ORAL
  Filled 2019-07-25 (×2): qty 45

## 2019-07-25 MED ORDER — NITROGLYCERIN 0.4 MG SL SUBL
0.4000 mg | SUBLINGUAL_TABLET | SUBLINGUAL | Status: DC | PRN
  Administered 2019-07-25: 0.4 mg via SUBLINGUAL
  Filled 2019-07-25: qty 1

## 2019-07-25 MED ORDER — MAGNESIUM HYDROXIDE 400 MG/5ML PO SUSP
30.0000 mL | Freq: Every day | ORAL | Status: DC
  Administered 2019-07-26: 30 mL via ORAL
  Filled 2019-07-25 (×2): qty 30

## 2019-07-25 SURGICAL SUPPLY — 13 items
CATH INFINITI 5FR MULTPACK ANG (CATHETERS) ×1 IMPLANT
CLOSURE MYNX CONTROL 5F (Vascular Products) ×1 IMPLANT
DEVICE RAD COMP TR BAND LRG (VASCULAR PRODUCTS) ×1 IMPLANT
GLIDESHEATH SLEND SS 6F .021 (SHEATH) ×1 IMPLANT
GUIDEWIRE INQWIRE 1.5J.035X260 (WIRE) IMPLANT
INQWIRE 1.5J .035X260CM (WIRE) ×2
KIT HEART LEFT (KITS) ×2 IMPLANT
PACK CARDIAC CATHETERIZATION (CUSTOM PROCEDURE TRAY) ×2 IMPLANT
SHEATH PINNACLE 5F 10CM (SHEATH) ×1 IMPLANT
SHEATH PROBE COVER 6X72 (BAG) ×1 IMPLANT
SYR MEDRAD MARK 7 150ML (SYRINGE) ×2 IMPLANT
TRANSDUCER W/STOPCOCK (MISCELLANEOUS) ×2 IMPLANT
TUBING CIL FLEX 10 FLL-RA (TUBING) ×3 IMPLANT

## 2019-07-25 NOTE — Progress Notes (Signed)
ANTICOAGULATION CONSULT NOTE - Follow Up Consult  Pharmacy Consult for Heparin Indication: chest pain/ACS  Allergies  Allergen Reactions  . Benadryl [Diphenhydramine] Swelling and Other (See Comments)    Tongue swells and hallucinates- could not talk  . Codeine Nausea And Vomiting and Hypertension  . Meperidine Nausea Only, Swelling and Other (See Comments)    Tongue swells and "I feel like death"  . Prednisone     Hypertension   . Promethazine Other (See Comments)    Hypotension   . Propranolol Nausea And Vomiting  . Shellfish Allergy Nausea And Vomiting  . Tape Other (See Comments)    Tears the skin  . Tazarotene Other (See Comments)    Reaction not recalled   . Iodinated Diagnostic Agents Nausea Only and Rash    Patient Measurements: Height: 5\' 4"  (162.6 cm) Weight: 52 kg (114 lb 11.2 oz) IBW/kg (Calculated) : 54.7 Heparin Dosing Weight:  52.kg  Vital Signs: Temp: 98 F (36.7 C) (06/04 0800) Temp Source: Oral (06/04 0800) BP: 130/57 (06/04 0800) Pulse Rate: 56 (06/04 0800)  Labs: Recent Labs    07/24/19 1351 07/24/19 2016 07/24/19 2335 07/25/19 0720  HGB 12.6  --   --  12.9  HCT 39.1  --   --  38.3  PLT 256  --   --  240  HEPARINUNFRC  --   --   --  0.60  CREATININE 0.78  --   --   --   TROPONINIHS 2 79* 459*  --     Estimated Creatinine Clearance: 41.4 mL/min (by C-G formula based on SCr of 0.78 mg/dL).   Assessment: Anticoag: none pta - iv hep for r/o ACS. HL 0.6 in goal.CBC WNL.  Goal of Therapy:  Heparin level 0.3-0.7 units/ml Monitor platelets by anticoagulation protocol: Yes   Plan:  Con't IV heparin 600 units/hr Daily HL and CBC Cath   Lanore Renderos S. 09/24/19, PharmD, BCPS Clinical Staff Pharmacist [email or web address] Merilynn Finland, Kathyjo Briere Stillinger 07/25/2019,8:30 AM

## 2019-07-25 NOTE — Progress Notes (Signed)
Patient back to room at this time from cath lab. V/s and assessment complete. TR band in place with 11 cc air per RN. Right groin clean dry and intact. Will continue to monitor

## 2019-07-25 NOTE — Progress Notes (Signed)
ANTICOAGULATION CONSULT NOTE - Initial Consult  Pharmacy Consult for heparin Indication: chest pain/ACS  Allergies  Allergen Reactions  . Benadryl [Diphenhydramine] Swelling and Other (See Comments)    Tongue swells and hallucinates- could not talk  . Codeine Nausea And Vomiting and Hypertension  . Meperidine Nausea Only, Swelling and Other (See Comments)    Tongue swells and "I feel like death"  . Prednisone     Hypertension   . Promethazine Other (See Comments)    Hypotension   . Propranolol Nausea And Vomiting  . Shellfish Allergy Nausea And Vomiting  . Tape Other (See Comments)    Tears the skin  . Tazarotene Other (See Comments)    Reaction not recalled   . Iodinated Diagnostic Agents Nausea Only and Rash    Patient Measurements: Height: 5\' 4"  (162.6 cm) Weight: 53.5 kg (118 lb) IBW/kg (Calculated) : 54.7  Vital Signs: Temp: 98.2 F (36.8 C) (06/03 2315) Temp Source: Oral (06/03 2315) BP: 139/74 (06/04 0000) Pulse Rate: 60 (06/04 0000)  Labs: Recent Labs    07/24/19 1351 07/24/19 2016  HGB 12.6  --   HCT 39.1  --   PLT 256  --   CREATININE 0.78  --   TROPONINIHS 2 79*    Estimated Creatinine Clearance: 42.6 mL/min (by C-G formula based on SCr of 0.78 mg/dL).  Assessment: CC/HPI: 86 yo f presenting with CP and neck pain  PMH: HTN HLD hypothyroid CAD w CABG in 2020  Anticoag: none pta - iv hep for r/o ACS   Renal: SCr 0.78  Heme/Onc: H&H 12.6/39.1, PLt 256  Goal of Therapy:  Heparin level 0.3-0.7 units/ml Monitor platelets by anticoagulation protocol: Yes   Plan:  Dc enox 40 Heparin bolus 3000 units x 1 Heparin gtt 600 units/hr Initial hep lvl 0830 Daily HL CBC F/U cards plans  09-21-1993, PharmD, BCPS, BCCCP Clinical Pharmacist [phone]  Please check AMION for all [facility] Pharmacy numbers  07/25/2019 12:23 AM

## 2019-07-25 NOTE — Progress Notes (Signed)
Thank you :)

## 2019-07-25 NOTE — Interval H&P Note (Signed)
History and Physical Interval Note:  07/25/2019 12:05 PM  Leah Pittman  has presented today for surgery, with the diagnosis of nonstemi.  The various methods of treatment have been discussed with the patient and family. After consideration of risks, benefits and other options for treatment, the patient has consented to  Procedure(s): LEFT HEART CATH AND CORONARY ANGIOGRAPHY (N/A) as a surgical intervention.  The patient's history has been reviewed, patient examined, no change in status, stable for surgery.  I have reviewed the patient's chart and labs.  Questions were answered to the patient's satisfaction.    Cath Lab Visit (complete for each Cath Lab visit)  Clinical Evaluation Leading to the Procedure:   ACS: Yes.    Non-ACS:    Anginal Classification: CCS III  Anti-ischemic medical therapy: Minimal Therapy (1 class of medications)  Non-Invasive Test Results: No non-invasive testing performed  Prior CABG: Previous CABG       Verne Carrow

## 2019-07-25 NOTE — Progress Notes (Signed)
  Echocardiogram 2D Echocardiogram has been performed.  Leah Pittman Leah Pittman 07/25/2019, 3:05 PM

## 2019-07-25 NOTE — Progress Notes (Signed)
PROGRESS NOTE    Leah Pittman  VZD:[id number] DOB: 08-12-1930 DOA: 07/24/2019 PCP: Blane Ohara, MD   Brief Narrative: 86 year old with past medical history significant for multiple episodes CAD status post CABG ( 06/2018 LIMA LAD, SVG- D1, sequential SVG OM- 1 OM -2) hypertension, hyperlipidemia, hypothyroidism who presents to the emergency department complaining of neck pain and subsequently developed chest pain with associated troponinemia. Patient was complaining of neck pain, started 2 weeks prior to admission, patient think is related to her high blood pressure.  She will take her medications twice a week.  While patient was waiting in triage she suddenly developed substernal chest pain heaviness and shortness of breath. Systolic blood pressure was significantly elevated on arrival 230.  Chest x-ray negative for acute abnormality.  CT C spine mild cervical spondylosis from C4-5, C5 C 6-7.  No acute bony abnormality.  Assessment & Plan:   Principal Problem:   Chest pain Active Problems:   Anxiety   Hypercholesterolemia   Hypertensive emergency   Neck pain   NSTEMI (non-ST elevated myocardial infarction) (HCC)   1-Chest pain, Known CAD. non-STEMI: Underwent cath which showed patent CABG Evaluated by cardiology medical management.   2-Hypertensive emergency: BP improved on Home BP medications.   3-Neck pain; tylenol.   4-Constipation;  Had very small BM, add milk of magnesia  5-Hyperlipidemia: Continue with Zocor 6-Hypothyroidism: Continue with Synthroid. 7-GERD: Continue with PPI 8-anxiety: Continue with Ativan as needed  Estimated body mass index is 19.69 kg/m as calculated from the following:   Height as of this encounter: 5\' 4"  (1.626 m).   Weight as of this encounter: 52 kg.   DVT prophylaxis: Heparin Code Status: DNR Family Communication: Disposition Plan:  Status is: inpatient.    Dispo: The patient is from: Home              Anticipated d/c is to:  Home              Anticipated d/c date is: 1 day              Patient currently is not medically stable to d/c.       Subjective: She is chest pain free, she has not had a good BM Does not feel ready to go home   Objective: Vitals:   07/25/19 0310 07/25/19 0400 07/25/19 0500 07/25/19 0600  BP: 135/61 (!) 146/53 (!) 125/46 120/70  Pulse: 65 63 (!) 53 (!) 57  Resp: 17 16 18 14   Temp:  98.2 F (36.8 C)    TempSrc:  Oral    SpO2: 95% 97% 96% 98%  Weight:      Height:        Intake/Output Summary (Last 24 hours) at 07/25/2019 0723 Last data filed at 07/25/2019 0600 Gross per 24 hour  Intake 171.99 ml  Output --  Net 171.99 ml   Filed Weights   07/24/19 1920 07/25/19 0136  Weight: 53.5 kg 52 kg    Examination:  General exam: Appears calm and comfortable  Respiratory system: Clear to auscultation. Respiratory effort normal. Cardiovascular system: S1 & S2 heard, RRR. No JVD, murmurs, rubs, gallops or clicks. No pedal edema. Gastrointestinal system: Abdomen is nondistended, soft and nontender. No organomegaly or masses felt. Normal bowel sounds heard. Central nervous system: Alert and oriented. No focal neurological deficits. Extremities: Symmetric 5 x 5 power. Skin: No rashes, lesions or ulcers    Data Reviewed: I have personally reviewed following labs and imaging studies  CBC: Recent Labs  Lab 07/24/19 1351  WBC 7.5  HGB 12.6  HCT 39.1  MCV 95.8  PLT 256   Basic Metabolic Panel: Recent Labs  Lab 07/24/19 1351  NA 137  K 4.4  CL 104  CO2 21*  GLUCOSE 96  BUN 10  CREATININE 0.78  CALCIUM 10.2   GFR: Estimated Creatinine Clearance: 41.4 mL/min (by C-G formula based on SCr of 0.78 mg/dL). Liver Function Tests: No results for input(s): AST, ALT, ALKPHOS, BILITOT, PROT, ALBUMIN in the last 168 hours. No results for input(s): LIPASE, AMYLASE in the last 168 hours. No results for input(s): AMMONIA in the last 168 hours. Coagulation Profile: No results  for input(s): INR, PROTIME in the last 168 hours. Cardiac Enzymes: No results for input(s): CKTOTAL, CKMB, CKMBINDEX, TROPONINI in the last 168 hours. BNP (last 3 results) No results for input(s): PROBNP in the last 8760 hours. HbA1C: No results for input(s): HGBA1C in the last 72 hours. CBG: No results for input(s): GLUCAP in the last 168 hours. Lipid Profile: No results for input(s): CHOL, HDL, LDLCALC, TRIG, CHOLHDL, LDLDIRECT in the last 72 hours. Thyroid Function Tests: No results for input(s): TSH, T4TOTAL, FREET4, T3FREE, THYROIDAB in the last 72 hours. Anemia Panel: No results for input(s): VITAMINB12, FOLATE, FERRITIN, TIBC, IRON, RETICCTPCT in the last 72 hours. Sepsis Labs: No results for input(s): PROCALCITON, LATICACIDVEN in the last 168 hours.  Recent Results (from the past 240 hour(s))  SARS Coronavirus 2 by RT PCR (hospital order, performed in [facility] hospital lab) Nasopharyngeal Nasopharyngeal Swab     Status: None   Collection Time: 07/24/19  9:40 PM   Specimen: Nasopharyngeal Swab  Result Value Ref Range Status   SARS Coronavirus 2 NEGATIVE NEGATIVE Final    Comment: (NOTE) SARS-CoV-2 target nucleic acids are NOT DETECTED. The SARS-CoV-2 RNA is generally detectable in upper and lower respiratory specimens during the acute phase of infection. The lowest concentration of SARS-CoV-2 viral copies this assay can detect is 250 copies / mL. A negative result does not preclude SARS-CoV-2 infection and should not be used as the sole basis for treatment or other patient management decisions.  A negative result may occur with improper specimen collection / handling, submission of specimen other than nasopharyngeal swab, presence of viral mutation(s) within the areas targeted by this assay, and inadequate number of viral copies (<250 copies / mL). A negative result must be combined with clinical observations, patient history, and epidemiological information. Fact Sheet  for Patients:   BoilerBrush.com.cy Fact Sheet for Healthcare Providers: https://pope.com/ This test is not yet approved or cleared  by the [place] FDA and has been authorized for detection and/or diagnosis of SARS-CoV-2 by FDA under an Emergency Use Authorization (EUA).  This EUA will remain in effect (meaning this test can be used) for the duration of the COVID-19 declaration under Section 564(b)(1) of the Act, 21 U.S.C. section 360bbb-3(b)(1), unless the authorization is terminated or revoked sooner. Performed at [facility], [address] [place], [place] [address]          Radiology Studies: DG Chest 2 View  Result Date: 07/24/2019 CLINICAL DATA:  Chest pain and neck pain radiating to behind eyes for 4 days, history hypertension, COVID-19 EXAM: CHEST - 2 VIEW COMPARISON:  01/02/2019 FINDINGS: Normal heart size post CABG. Mediastinal contours and pulmonary vascularity normal. Atherosclerotic calcification aorta. Tiny calcified granuloma mid RIGHT lung. Hyperinflation and bronchitic changes consistent with COPD. No acute infiltrate, pleural effusion  or pneumothorax. Osseous structures unremarkable. IMPRESSION: Post CABG. COPD changes. No acute abnormalities. Electronically Signed   By: Lavonia Dana M.D.   On: 07/24/2019 14:33   DG Abd 1 View  Result Date: 07/25/2019 CLINICAL DATA:  Constipation and nausea EXAM: ABDOMEN - 1 VIEW COMPARISON:  07/08/2018 FINDINGS: Scattered large and small bowel gas is noted. Mild constipation is noted within the colon. No obstructive changes are seen. No acute bony abnormality is noted. Prior surgical changes in the right hip are seen. IMPRESSION: Mild constipation. Electronically Signed   By: Inez Catalina M.D.   On: 07/25/2019 00:49   CT Cervical Spine Wo Contrast  Result Date: 07/24/2019 CLINICAL DATA:  Left-sided neck pain radiating to head EXAM: CT CERVICAL SPINE WITHOUT CONTRAST TECHNIQUE:  Multidetector CT imaging of the cervical spine was performed without intravenous contrast. Multiplanar CT image reconstructions were also generated. COMPARISON:  11/14/2016 FINDINGS: Alignment: Alignment is anatomic. Skull base and vertebrae: There are no acute displaced fractures. Soft tissues and spinal canal: No prevertebral fluid or swelling. No visible canal hematoma. Disc levels: There is mild multilevel cervical spondylosis, with disc space narrowing most pronounced at C4-5, C5-6, and C6-7. Mild right predominant uncovertebral hypertrophy at C5-6 and C6-7 results in minimal right neural foraminal encroachment. Central canal remains patent. Upper chest: Airway is patent.  Lung apices are clear. Other: Reconstructed images demonstrate no additional findings. IMPRESSION: 1. Mild cervical spondylosis from C4-5 through C6-7. 2. No acute bony abnormality. Electronically Signed   By: Randa Ngo M.D.   On: 07/24/2019 20:01        Scheduled Meds: . aspirin EC  81 mg Oral Daily  . cholecalciferol  2,000 Units Oral Daily  . diclofenac Sodium  2 g Topical QID  . dorzolamide-timolol  1 drop Left Eye BID  . hydrocortisone sod succinate (SOLU-CORTEF) inj  200 mg Intravenous Once  . latanoprost  1 drop Left Eye QHS  . levothyroxine  100 mcg Oral Daily  . losartan  25 mg Oral Daily  . pantoprazole  40 mg Oral Daily  . simvastatin  20 mg Oral QHS   Continuous Infusions: . heparin 600 Units/hr (07/25/19 0600)     LOS: 0 days    Time spent: 35 minutes.     Elmarie Shiley, MD [facility]   If 7PM-7AM, please contact night-coverage [email or web address]  07/25/2019, 7:23 AM

## 2019-07-25 NOTE — H&P (View-Only) (Signed)
Cardiology Rounding Note:  Pt admitted early this am with chest pain/neck pain.  Troponin is elevated.  EKG personally reviewed and does not show ischemic changes.  Labs reviewed by me.  Will plan cardiac cath later today to exclude progression of CAD  I have reviewed the risks, indications, and alternatives to cardiac catheterization, possible angioplasty, and stenting with the patient. Risks include but are not limited to bleeding, infection, vascular injury, stroke, myocardial infection, arrhythmia, kidney injury, radiation-related injury in the case of prolonged fluoroscopy use, emergency cardiac surgery, and death. The patient understands the risks of serious complication is 1-2 in 1000 with diagnostic cardiac cath and 1-2% or less with angioplasty/stenting.  Verne Carrow 07/25/2019 8:22 AM

## 2019-07-25 NOTE — Consult Note (Signed)
CARDIOLOGY CONSULT NOTE  Patient ID: Leah Pittman MRN: [id number] DOB/AGE: Feb 05, 1931 86 y.o.  Admit date: 07/24/2019 Primary Physician [name], Fritzi Mandes, MD Primary Cardiologist: Dr. Royann Shivers Chief Complaint Chest and Neck Pain Requesting ED  HPI:   Leah Pittman is a 86 y.o. female with multivessel CAD s/p CABG (06/2018; LIMA-LAD, SVG-D1, sequential SVG-OM1-OM2), hypertension, hyperlipidemia, hypothyroidism, and arthritis who initially presented to the ED for evaluation of neck pain and subsequently developed chest pain with associated troponinemia.  Cardiology is consulted for assistance with management.  Interviewed and I concur with findings from his admission H&P paraphrased hereafter.  Patient reports that she came to the ED to be evaluated for neck pain that she described as left-sided and spasm-like that had been going on intermittently for the past 2 weeks, but acutely worsened over the past several days.  Over this time, she denies any acute chest pain but did notice some mild chest discomfort when lifting heavy objects.   While she is waiting to be evaluated in the emergency department for several hours, started experiencing headaches with associated nausea.  She initially thought that her symptoms were related to elevated blood pressures.  She reports that she takes her antihypertensives a couple of times a week, on an as-needed basis.  Shortly after taking her blood pressure medications, she started feeling substernal chest heaviness and shortness of breath.  Describes the chest pain as dull, centered in the middle of her chest, 5/10 in severity.  Fortunately, her left-sided neck pain had resolved time.   She received sublingual nitroglycerin for her chest pain which after some delay but eventually made her feel better.  Her admission ECG did not show any dynamic ST changes unremarkable.  Her initial troponin was 2, but up trended to 79, and then 459.  At the time of my interview she  was endorsing fleeting chest pain, but was otherwise comfortable.  Past Medical History:  Diagnosis Date  . Anxiety   . Arthritis   . Chest pain 2011   CARDIOLITE - no significant symptoms, EKG changes, or arrhythmias  . Dyspnea 02/16/2012   2D ECHO - EF 55-60%, normal  . Fatigue   . Glaucoma   . Headache(784.0)   . Hiatal hernia   . High blood pressure 02/16/2012   RENAL DOPPLER - normal  . Hyperlipidemia   . Hypothyroidism   . Labile hypertension   . Lightheadedness   . Migraine headache   . Multiple allergies   . Myalgia   . Pain, lower leg    Calf  . Problem of menstruation   . Reflux   . SOB (shortness of breath)   . Thyroid disease   . Urinary problem     Past Surgical History:  Procedure Laterality Date  . ABDOMINAL HYSTERECTOMY  1976  . CHOLECYSTECTOMY  2000  . CORONARY ARTERY BYPASS GRAFT N/A 07/10/2018   Procedure: CORONARY ARTERY BYPASS GRAFTING (CABG), FREE LIMA;  Surgeon: [name], MD;  Location: [facility] OR;  Service: Open Heart Surgery;  Laterality: N/A;  . CYSTECTOMY  1974   Intestine  . CYSTECTOMY  1996   Brain stem  . EYE SURGERY  2003  . LEFT HEART CATH AND CORONARY ANGIOGRAPHY N/A 07/08/2018   Procedure: LEFT HEART CATH AND CORONARY ANGIOGRAPHY;  Surgeon: Runell Gess, MD;  Location: [facility] INVASIVE CV LAB;  Service: Cardiovascular;  Laterality: N/A;  . TEE WITHOUT CARDIOVERSION N/A 07/10/2018   Procedure: TRANSESOPHAGEAL ECHOCARDIOGRAM (TEE);  Surgeon: Evelene Croon  K, MD;  Location: [facility] OR;  Service: Open Heart Surgery;  Laterality: N/A;    Allergies  Allergen Reactions  . Benadryl [Diphenhydramine] Swelling and Other (See Comments)    Tongue swells and hallucinates- could not talk  . Codeine Nausea And Vomiting and Hypertension  . Meperidine Nausea Only, Swelling and Other (See Comments)    Tongue swells and "I feel like death"  . Prednisone     Hypertension   . Promethazine Other (See Comments)    Hypotension   . Propranolol Nausea And  Vomiting  . Shellfish Allergy Nausea And Vomiting  . Tape Other (See Comments)    Tears the skin  . Tazarotene Other (See Comments)    Reaction not recalled   . Iodinated Diagnostic Agents Nausea Only and Rash   (Not in a hospital admission)  Family History  Problem Relation Age of Onset  . Heart attack Father   . Stroke Father   . Cancer Sister   . Cancer Brother   . Asthma Brother   . Cancer Brother     Social History   Socioeconomic History  . Marital status: Married    Spouse name: Not on file  . Number of children: 3  . Years of education: college  . Highest education level: Not on file  Occupational History  . Occupation: Retired  Tobacco Use  . Smoking status: Never Smoker  . Smokeless tobacco: Never Used  Substance and Sexual Activity  . Alcohol use: No  . Drug use: No  . Sexual activity: Not on file  Other Topics Concern  . Not on file  Social History Narrative   Lives with husband in [place], [place]   Social Determinants of Health   Financial Resource Strain:   . Difficulty of Paying Living Expenses:   Food Insecurity:   . Worried About Programme researcher, broadcasting/film/video in the Last Year:   . Barista in the Last Year:   Transportation Needs:   . Freight forwarder (Medical):   Marland Kitchen Lack of Transportation (Non-Medical):   Physical Activity:   . Days of Exercise per Week:   . Minutes of Exercise per Session:   Stress:   . Feeling of Stress :   Social Connections:   . Frequency of Communication with Friends and Family:   . Frequency of Social Gatherings with Friends and Family:   . Attends Religious Services:   . Active Member of Clubs or Organizations:   . Attends Banker Meetings:   Marland Kitchen Marital Status:   Intimate Partner Violence:   . Fear of Current or Ex-Partner:   . Emotionally Abused:   Marland Kitchen Physically Abused:   . Sexually Abused:      Review of Systems: [y] = yes, [ ]  = no       General: Weight gain [] ; Weight loss [ ] ; Anorexia [  ]; Fatigue [ ] ; Fever [ ] ; Chills [ ] ; Weakness [ ]     Cardiac: Chest pain/pressure [x ]; Resting SOB [ ] ; Exertional SOB [ ] ; Orthopnea [ ] ; Pedal Edema [ ] ; Palpitations [ ] ; Syncope [ ] ; Presyncope [ ] ; Paroxysmal nocturnal dyspnea[ ]     Pulmonary: Cough [ ] ; Wheezing[ ] ; Hemoptysis[ ] ; Sputum [ ] ; Snoring [ ]     GI: Vomiting[ ] ; Dysphagia[ ] ; Melena[ ] ; Hematochezia [ ] ; Heartburn[ ] ; Abdominal pain [ ] ; Constipation [ ] ; Diarrhea [ ] ; BRBPR [ ]     GU: Hematuria[ ] ; Dysuria [ ] ;  Nocturia[ ]   Vascular: Pain in legs with walking [ ] ; Pain in feet with lying flat [ ] ; Non-healing sores [ ] ; Stroke [ ] ; TIA [ ] ; Slurred speech [ ] ;    Neuro: Headaches[ ] ; Vertigo[ ] ; Seizures[ ] ; Paresthesias[ ] ;Blurred vision [ ] ; Diplopia [ ] ; Vision changes [ ]     Ortho/Skin: Arthritis [ ] ; Joint pain [ ] ; Muscle pain [ ] ; Joint swelling [ ] ; Back Pain [ ] ; Rash [ ]     Psych: Depression[ ] ; Anxiety[ ]     Heme: Bleeding problems [ ] ; Clotting disorders [ ] ; Anemia [ ]     Endocrine: Diabetes [ ] ; Thyroid dysfunction[ ]   Physical Exam: Blood pressure (!) 169/60, pulse (!) 53, temperature 98.2 F (36.8 C), temperature source Oral, resp. rate 12, height 5\' 4"  (1.626 m), weight 53.5 kg, SpO2 98 %.   GENERAL: Patient is afebrile, Vital signs reviewed, Well appearing, Patient appears comfortable, Alert and lucid. EYES: Normal inspection. HEENT:  normocephalic, atraumatic , normal ENT inspection. ORAL:  Moist NECK:  supple , normal inspection. CARD:  regular rate and rhythm, heart sounds normal. RESP:  no respiratory distress, breath sounds normal. ABD: soft, nontender to palpation, BS present, soft. MUSC:  normal ROM, non-tender , no pedal edema . SKIN: color normal, no rash, warm, dry . NEURO: awake & alert, lucid, no motor/sensory deficit. Gait stable. PSYCH: mood/affect normal.  Labs: Lab Results  Component Value Date   BUN 10 07/24/2019   Lab Results  Component Value Date   CREATININE  0.78 07/24/2019   Lab Results  Component Value Date   NA 137 07/24/2019   K 4.4 07/24/2019   CL 104 07/24/2019   CO2 21 (L) 07/24/2019   Lab Results  Component Value Date   TROPONINI 0.03 (HH) 07/08/2018   Lab Results  Component Value Date   WBC 7.5 07/24/2019   HGB 12.6 07/24/2019   HCT 39.1 07/24/2019   MCV 95.8 07/24/2019   PLT 256 07/24/2019   Lab Results  Component Value Date   CHOL 170 09/23/2018   HDL 64 09/23/2018   LDLCALC 69 09/23/2018   TRIG 185 (H) 09/23/2018   CHOLHDL 2.7 09/23/2018   Lab Results  Component Value Date   ALT 54 (H) 07/15/2018   AST 27 07/15/2018   ALKPHOS 48 07/15/2018   BILITOT 0.9 07/15/2018       EKG: NSR, no dynamic ST changes  ASSESSMENT AND PLAN:  Leah Pittman is a 86 y.o. female with multivessel CAD s/p CABG (06/2018; LIMA-LAD, SVG-D1, sequential SVG-OM1-OM2), hypertension, hyperlipidemia, hypothyroidism, and arthritis who initially presented to the ED for evaluation of neck pain and subsequently developed chest pain with associated troponinemia.  Cardiology is consulted for assistance with management.  #NSTEMI #HTN #HLD :: Markedly high troponin, somewhat out of proportion to her clinical presentation.  With recent CABG would not expect graft dysfunction this early, and if so, would expect to see some evidence of ECG changes, which I do not.  Given her history, will be aggressive and treat as a true type I NSTEMI (potential pulmonary embolus is also on the differential). -Pharmacy consult for heparin dosing for ACS -Continue home regimen of antihypertensives, statin therapy aspirin 81 mg daily -Keep n.p.o. for possible left heart catheterization in a.m.  -Patient with intolerance to contrast (nausea; she has a storied list of allergies); contrast allergy order set placed -Serial ECGs and trend troponin -TTE in a.m. to evaluate for wall motion abnormality - Pain  control with as needed analgesics and sublingual  nitro   Signed: Ralene Ok 07/25/2019, 12:51 AM

## 2019-07-25 NOTE — Progress Notes (Signed)
Cardiology Rounding Note:  Pt admitted early this am with chest pain/neck pain.  Troponin is elevated.  EKG personally reviewed and does not show ischemic changes.  Labs reviewed by me.  Will plan cardiac cath later today to exclude progression of CAD  I have reviewed the risks, indications, and alternatives to cardiac catheterization, possible angioplasty, and stenting with the patient. Risks include but are not limited to bleeding, infection, vascular injury, stroke, myocardial infection, arrhythmia, kidney injury, radiation-related injury in the case of prolonged fluoroscopy use, emergency cardiac surgery, and death. The patient understands the risks of serious complication is 1-2 in 1000 with diagnostic cardiac cath and 1-2% or less with angioplasty/stenting.  Leah Pittman 07/25/2019 8:22 AM  

## 2019-07-25 NOTE — Progress Notes (Signed)
Heparin stopped. Patient to cath lab at this time.

## 2019-07-26 DIAGNOSIS — I208 Other forms of angina pectoris: Secondary | ICD-10-CM

## 2019-07-26 LAB — CBC
HCT: 35.8 % — ABNORMAL LOW (ref 36.0–46.0)
Hemoglobin: 12.1 g/dL (ref 12.0–15.0)
MCH: 31.6 pg (ref 26.0–34.0)
MCHC: 33.8 g/dL (ref 30.0–36.0)
MCV: 93.5 fL (ref 80.0–100.0)
Platelets: 238 10*3/uL (ref 150–400)
RBC: 3.83 MIL/uL — ABNORMAL LOW (ref 3.87–5.11)
RDW: 13.9 % (ref 11.5–15.5)
WBC: 14.7 10*3/uL — ABNORMAL HIGH (ref 4.0–10.5)
nRBC: 0 % (ref 0.0–0.2)

## 2019-07-26 LAB — BASIC METABOLIC PANEL
Anion gap: 10 (ref 5–15)
BUN: 17 mg/dL (ref 8–23)
CO2: 21 mmol/L — ABNORMAL LOW (ref 22–32)
Calcium: 9.6 mg/dL (ref 8.9–10.3)
Chloride: 106 mmol/L (ref 98–111)
Creatinine, Ser: 0.83 mg/dL (ref 0.44–1.00)
GFR calc Af Amer: >60 mL/min (ref >60)
GFR calc non Af Amer: >60 mL/min (ref >60)
Glucose, Bld: 118 mg/dL — ABNORMAL HIGH (ref 70–99)
Potassium: 2.9 mmol/L — ABNORMAL LOW (ref 3.5–5.1)
Sodium: 137 mmol/L (ref 135–145)

## 2019-07-26 LAB — MAGNESIUM: Magnesium: 2 mg/dL (ref 1.7–2.4)

## 2019-07-26 MED ORDER — POTASSIUM CHLORIDE CRYS ER 20 MEQ PO TBCR
40.0000 meq | EXTENDED_RELEASE_TABLET | Freq: Once | ORAL | Status: AC
  Administered 2019-07-26: 40 meq via ORAL
  Filled 2019-07-26: qty 2

## 2019-07-26 MED ORDER — LACTULOSE 10 GM/15ML PO SOLN: 30.0000 g | Freq: Every day | ORAL | 0 refills | Status: DC

## 2019-07-26 MED ORDER — POTASSIUM CHLORIDE CRYS ER 20 MEQ PO TBCR: 20.0000 meq | EXTENDED_RELEASE_TABLET | Freq: Every day | ORAL | 0 refills | Status: DC

## 2019-07-26 MED ORDER — POTASSIUM CHLORIDE 10 MEQ/100ML IV SOLN
10.0000 meq | INTRAVENOUS | Status: DC
  Administered 2019-07-26: 10 meq via INTRAVENOUS
  Filled 2019-07-26 (×3): qty 100

## 2019-07-26 NOTE — Progress Notes (Signed)
PT Cancellation Note  Patient Details Name: Leah Pittman MRN: [id number] DOB: Dec 22, 1930   Cancelled Treatment:    Reason Eval/Treat Not Completed: Other (comment).  Pt is unable to walk due to pain from K+ infusion, then asleep.  Have made mult attempts to see her and will retry as time and pt allow.   Ivar Drape 07/26/2019, 2:27 PM    Samul Dada, PT MS Acute Rehab Dept. Number: [facility] [id number] and [facility] [phone]

## 2019-07-26 NOTE — Progress Notes (Addendum)
PROGRESS NOTE    Leah Pittman  ZOX:[id number] DOB: 09-Feb-1931 DOA: 07/24/2019 PCP: Blane Oharaox, [name], MD   Brief Narrative: 86 year old with past medical history significant for multiple episodes CAD status post CABG ( 06/2018 LIMA LAD, SVG- D1, sequential SVG OM- 1 OM -2) hypertension, hyperlipidemia, hypothyroidism who presents to the emergency department complaining of neck pain and subsequently developed chest pain with associated troponinemia. Patient was complaining of neck pain, started 2 weeks prior to admission, patient think is related to her high blood pressure.  She will take her medications twice a week.  While patient was waiting in triage she suddenly developed substernal chest pain heaviness and shortness of breath. Systolic blood pressure was significantly elevated on arrival 230.  Chest x-ray negative for acute abnormality.  CT C spine mild cervical spondylosis from C4-5, C5 C 6-7.  No acute bony abnormality.  Assessment & Plan:   Principal Problem:   Chest pain Active Problems:   Anxiety   Hypercholesterolemia   Hypertensive emergency   Neck pain   NSTEMI (non-ST elevated myocardial infarction) (HCC)   Elevated troponin   1-Chest pain, Known CAD. non-STEMI: Underwent cath which showed patent CABG Evaluated by cardiology medical management.   2-Hypertensive emergency: BP improved on Home BP medications.   3-Neck pain; tylenol.   4-Constipation;  Had very small BM, add milk of magnesia Continue with lactulose.   5-Hyperlipidemia: Continue with Zocor 6-Hypothyroidism: Continue with Synthroid. 7-GERD: Continue with PPI 8-anxiety: Continue with Ativan as needed 9-Hypokalemia; replete IV kcl.  10-leukocytosis; afebrile. Repeat labs in am Estimated body mass index is 19.68 kg/m as calculated from the following:   Height as of this encounter: 5\' 4"  (1.626 m).   Weight as of this encounter: 52 kg.   DVT prophylaxis: Heparin Code Status: DNR Family  Communication: Disposition Plan:  Status is: inpatient. Not feeling well, getting potasium. No significant BM   Dispo: The patient is from: Home              Anticipated d/c is to: Home              Anticipated d/c date is: 1 day              Patient currently is not medically stable to d/c.       Subjective: Still constipated, had very small BM.   Objective: Vitals:   07/26/19 0529 07/26/19 0815 07/26/19 0819 07/26/19 1056  BP: 101/74 (!) 114/47 (!) 114/45 (!) 99/37  Pulse: 93 72 69 69  Resp: 19 19 13 14   Temp: (!) 97.3 F (36.3 C) 98.2 F (36.8 C)  (!) 97.3 F (36.3 C)  TempSrc: Oral Oral  Oral  SpO2: 100% 97% 96% 99%  Weight:      Height:        Intake/Output Summary (Last 24 hours) at 07/26/2019 1352 Last data filed at 07/25/2019 1500 Gross per 24 hour  Intake 655.91 ml  Output --  Net 655.91 ml   Filed Weights   07/24/19 1920 07/25/19 0136 07/25/19 1109  Weight: 53.5 kg 52 kg 52 kg    Examination:  General exam: NAD  Respiratory system: CTA Cardiovascular system: S 1, S 2 RRR Gastrointestinal system: BS present, soft, nt Central nervous system: alert Extremities: Symmetric power  Data Reviewed: I have personally reviewed following labs and imaging studies  CBC: Recent Labs  Lab 07/24/19 1351 07/25/19 0720 07/26/19 0341  WBC 7.5 7.5 14.7*  HGB 12.6 12.9 12.1  HCT  39.1 38.3 35.8*  MCV 95.8 94.6 93.5  PLT 256 240 238   Basic Metabolic Panel: Recent Labs  Lab 07/24/19 1351 07/26/19 0341  NA 137 137  K 4.4 2.9*  CL 104 106  CO2 21* 21*  GLUCOSE 96 118*  BUN 10 17  CREATININE 0.78 0.83  CALCIUM 10.2 9.6  MG  --  2.0   GFR: Estimated Creatinine Clearance: 39.9 mL/min (by C-G formula based on SCr of 0.83 mg/dL). Liver Function Tests: No results for input(s): AST, ALT, ALKPHOS, BILITOT, PROT, ALBUMIN in the last 168 hours. No results for input(s): LIPASE, AMYLASE in the last 168 hours. No results for input(s): AMMONIA in the last 168  hours. Coagulation Profile: No results for input(s): INR, PROTIME in the last 168 hours. Cardiac Enzymes: No results for input(s): CKTOTAL, CKMB, CKMBINDEX, TROPONINI in the last 168 hours. BNP (last 3 results) No results for input(s): PROBNP in the last 8760 hours. HbA1C: No results for input(s): HGBA1C in the last 72 hours. CBG: No results for input(s): GLUCAP in the last 168 hours. Lipid Profile: No results for input(s): CHOL, HDL, LDLCALC, TRIG, CHOLHDL, LDLDIRECT in the last 72 hours. Thyroid Function Tests: No results for input(s): TSH, T4TOTAL, FREET4, T3FREE, THYROIDAB in the last 72 hours. Anemia Panel: No results for input(s): VITAMINB12, FOLATE, FERRITIN, TIBC, IRON, RETICCTPCT in the last 72 hours. Sepsis Labs: No results for input(s): PROCALCITON, LATICACIDVEN in the last 168 hours.  Recent Results (from the past 240 hour(s))  SARS Coronavirus 2 by RT PCR (hospital order, performed in [facility] hospital lab) Nasopharyngeal Nasopharyngeal Swab     Status: None   Collection Time: 07/24/19  9:40 PM   Specimen: Nasopharyngeal Swab  Result Value Ref Range Status   SARS Coronavirus 2 NEGATIVE NEGATIVE Final    Comment: (NOTE) SARS-CoV-2 target nucleic acids are NOT DETECTED. The SARS-CoV-2 RNA is generally detectable in upper and lower respiratory specimens during the acute phase of infection. The lowest concentration of SARS-CoV-2 viral copies this assay can detect is 250 copies / mL. A negative result does not preclude SARS-CoV-2 infection and should not be used as the sole basis for treatment or other patient management decisions.  A negative result may occur with improper specimen collection / handling, submission of specimen other than nasopharyngeal swab, presence of viral mutation(s) within the areas targeted by this assay, and inadequate number of viral copies (<250 copies / mL). A negative result must be combined with clinical observations, patient history, and  epidemiological information. Fact Sheet for Patients:   BoilerBrush.com.cy Fact Sheet for Healthcare Providers: https://pope.com/ This test is not yet approved or cleared  by the [place] FDA and has been authorized for detection and/or diagnosis of SARS-CoV-2 by FDA under an Emergency Use Authorization (EUA).  This EUA will remain in effect (meaning this test can be used) for the duration of the COVID-19 declaration under Section 564(b)(1) of the Act, 21 U.S.C. section 360bbb-3(b)(1), unless the authorization is terminated or revoked sooner. Performed at [facility], [address] [place], [place] [address]          Radiology Studies: DG Chest 2 View  Result Date: 07/24/2019 CLINICAL DATA:  Chest pain and neck pain radiating to behind eyes for 4 days, history hypertension, COVID-19 EXAM: CHEST - 2 VIEW COMPARISON:  01/02/2019 FINDINGS: Normal heart size post CABG. Mediastinal contours and pulmonary vascularity normal. Atherosclerotic calcification aorta. Tiny calcified granuloma mid RIGHT lung. Hyperinflation and bronchitic changes consistent  with COPD. No acute infiltrate, pleural effusion or pneumothorax. Osseous structures unremarkable. IMPRESSION: Post CABG. COPD changes. No acute abnormalities. Electronically Signed   By: Ulyses Southward M.D.   On: 07/24/2019 14:33   DG Abd 1 View  Result Date: 07/25/2019 CLINICAL DATA:  Constipation and nausea EXAM: ABDOMEN - 1 VIEW COMPARISON:  07/08/2018 FINDINGS: Scattered large and small bowel gas is noted. Mild constipation is noted within the colon. No obstructive changes are seen. No acute bony abnormality is noted. Prior surgical changes in the right hip are seen. IMPRESSION: Mild constipation. Electronically Signed   By: Alcide Clever M.D.   On: 07/25/2019 00:49   CT Cervical Spine Wo Contrast  Result Date: 07/24/2019 CLINICAL DATA:  Left-sided neck pain radiating to head EXAM: CT  CERVICAL SPINE WITHOUT CONTRAST TECHNIQUE: Multidetector CT imaging of the cervical spine was performed without intravenous contrast. Multiplanar CT image reconstructions were also generated. COMPARISON:  11/14/2016 FINDINGS: Alignment: Alignment is anatomic. Skull base and vertebrae: There are no acute displaced fractures. Soft tissues and spinal canal: No prevertebral fluid or swelling. No visible canal hematoma. Disc levels: There is mild multilevel cervical spondylosis, with disc space narrowing most pronounced at C4-5, C5-6, and C6-7. Mild right predominant uncovertebral hypertrophy at C5-6 and C6-7 results in minimal right neural foraminal encroachment. Central canal remains patent. Upper chest: Airway is patent.  Lung apices are clear. Other: Reconstructed images demonstrate no additional findings. IMPRESSION: 1. Mild cervical spondylosis from C4-5 through C6-7. 2. No acute bony abnormality. Electronically Signed   By: Sharlet Salina M.D.   On: 07/24/2019 20:01   CARDIAC CATHETERIZATION  Result Date: 07/25/2019  Prox RCA to Mid RCA lesion is 40% stenosed.  Ost LM to Mid LM lesion is 60% stenosed.  Mid Cx lesion is 99% stenosed.  SVG graft was visualized by angiography and is normal in caliber.  The graft exhibits no disease.  SVG graft was visualized by angiography and is normal in caliber.  The graft exhibits no disease.  Mid LAD lesion is 70% stenosed.  Severe double vessel CAD Moderately severe ostial left main stenosis Severe stenosis mid LAD. The SVG to the LAD is patent. The LIMA does not appear to have been used despite the findings in the operative report. The LIMA is small and atretic. Severe stenosis mid Circumflex. OM and OM2 fill from the patent vein graft. Mild disease mid RCA Recommendations: Continue medical management of CAD. Will start lactulose for constipation. She could be discharged later today after bedrest if she is stable.   ECHOCARDIOGRAM COMPLETE  Result Date:  07/25/2019    ECHOCARDIOGRAM REPORT   Patient Name:   Leah Pittman Date of Exam: 07/25/2019 Medical Rec #:  [id number]       Height:       64.0 in Accession #:    [id number]      Weight:       114.6 lb Date of Birth:  May 09, 1930      BSA:          1.544 m Patient Age:    86 years        BP:           122/43 mmHg Patient Gender: F               HR:           59 bpm. Exam Location:  Inpatient Procedure: 2D Echo, Cardiac Doppler and Color Doppler Indications:    R07.9* Chest  pain, unspecified  History:        Patient has prior history of Echocardiogram examinations, most                 recent 07/10/2018. Signs/Symptoms:Dyspnea; Risk                 Factors:Dyslipidemia. Thyroid Disease. GERD.  Sonographer:    Elmarie Shiley Dance Referring Phys: [id number] VASUNDHRA RATHORE IMPRESSIONS  1. Abnormal septal motion inferior basal hypokinesis . Left ventricular ejection fraction, by estimation, is 50 to 55%. The left ventricle has low normal function. The left ventricle demonstrates regional wall motion abnormalities (see scoring diagram/findings for description). Left ventricular diastolic parameters are consistent with age-related delayed relaxation (normal).  2. Right ventricular systolic function is normal. The right ventricular size is normal.  3. The mitral valve is normal in structure. Trivial mitral valve regurgitation. No evidence of mitral stenosis.  4. The aortic valve is tricuspid. Aortic valve regurgitation is trivial. Mild aortic valve sclerosis is present, with no evidence of aortic valve stenosis.  5. The inferior vena cava is normal in size with greater than 50% respiratory variability, suggesting right atrial pressure of 3 mmHg. FINDINGS  Left Ventricle: Abnormal septal motion inferior basal hypokinesis. Left ventricular ejection fraction, by estimation, is 50 to 55%. The left ventricle has low normal function. The left ventricle demonstrates regional wall motion abnormalities. The left ventricular internal cavity  size was normal in size. There is no left ventricular hypertrophy. Left ventricular diastolic parameters are consistent with age-related delayed relaxation (normal). Right Ventricle: The right ventricular size is normal. No increase in right ventricular wall thickness. Right ventricular systolic function is normal. Left Atrium: Left atrial size was normal in size. Right Atrium: Right atrial size was normal in size. Pericardium: There is no evidence of pericardial effusion. Mitral Valve: The mitral valve is normal in structure. There is mild thickening of the mitral valve leaflet(s). There is mild calcification of the mitral valve leaflet(s). Normal mobility of the mitral valve leaflets. Trivial mitral valve regurgitation. No evidence of mitral valve stenosis. Tricuspid Valve: The tricuspid valve is normal in structure. Tricuspid valve regurgitation is trivial. No evidence of tricuspid stenosis. Aortic Valve: The aortic valve is tricuspid. Aortic valve regurgitation is trivial. Mild aortic valve sclerosis is present, with no evidence of aortic valve stenosis. Pulmonic Valve: The pulmonic valve was normal in structure. Pulmonic valve regurgitation is not visualized. No evidence of pulmonic stenosis. Aorta: The aortic root is normal in size and structure. Venous: The inferior vena cava is normal in size with greater than 50% respiratory variability, suggesting right atrial pressure of 3 mmHg. IAS/Shunts: No atrial level shunt detected by color flow Doppler.  LEFT VENTRICLE PLAX 2D LVIDd:         3.50 cm  Diastology LVIDs:         2.30 cm  LV e' lateral:   7.54 cm/s LV PW:         0.90 cm  LV E/e' lateral: 9.4 LV IVS:        0.80 cm  LV e' medial:    4.05 cm/s LVOT diam:     1.80 cm  LV E/e' medial:  17.5 LV SV:         49 LV SV Index:   32 LVOT Area:     2.54 cm  RIGHT VENTRICLE            IVC RV Basal diam:  2.40 cm    IVC  diam: 1.50 cm RV Mid diam:    1.20 cm RV S prime:     6.81 cm/s TAPSE (M-mode): 1.1 cm LEFT  ATRIUM             Index       RIGHT ATRIUM          Index LA diam:        3.40 cm 2.20 cm/m  RA Area:     5.82 cm LA Vol (A2C):   32.3 ml 20.92 ml/m RA Volume:   6.62 ml  4.29 ml/m LA Vol (A4C):   20.1 ml 13.02 ml/m LA Biplane Vol: 27.5 ml 17.81 ml/m  AORTIC VALVE LVOT Vmax:   97.90 cm/s LVOT Vmean:  56.200 cm/s LVOT VTI:    0.193 m  AORTA Ao Root diam: 2.90 cm Ao Asc diam:  2.60 cm MITRAL VALVE MV Area (PHT): 1.99 cm    SHUNTS MV Decel Time: 382 msec    Systemic VTI:  0.19 m MV E velocity: 70.70 cm/s  Systemic Diam: 1.80 cm MV A velocity: 93.40 cm/s MV E/A ratio:  0.76 Jenkins Rouge MD Electronically signed by Jenkins Rouge MD Signature Date/Time: 07/25/2019/3:11:32 PM    Final         Scheduled Meds: . aspirin EC  81 mg Oral Daily  . cholecalciferol  2,000 Units Oral Daily  . diclofenac Sodium  2 g Topical QID  . dorzolamide-timolol  1 drop Left Eye BID  . lactulose  30 g Oral Daily  . latanoprost  1 drop Left Eye QHS  . levothyroxine  100 mcg Oral Daily  . losartan  25 mg Oral Daily  . magnesium hydroxide  30 mL Oral Daily  . pantoprazole  40 mg Oral Daily  . potassium chloride  40 mEq Oral Once  . simvastatin  20 mg Oral QHS  . sodium chloride flush  3 mL Intravenous Q12H   Continuous Infusions: . sodium chloride       LOS: 1 day    Time spent: 35 minutes.     Elmarie Shiley, MD [facility]   If 7PM-7AM, please contact night-coverage [email or web address]  07/26/2019, 1:52 PM

## 2019-07-26 NOTE — Progress Notes (Signed)
Progress Note  Patient Name: Leah Pittman Date of Encounter: 07/26/2019  Primary Cardiologist: Thurmon Fair, MD   Subjective   Multiple achest and pains including sob, chest pain and cough.  Inpatient Medications    Scheduled Meds:  aspirin EC  81 mg Oral Daily   cholecalciferol  2,000 Units Oral Daily   diclofenac Sodium  2 g Topical QID   dorzolamide-timolol  1 drop Left Eye BID   lactulose  30 g Oral Daily   latanoprost  1 drop Left Eye QHS   levothyroxine  100 mcg Oral Daily   losartan  25 mg Oral Daily   magnesium hydroxide  30 mL Oral Daily   pantoprazole  40 mg Oral Daily   simvastatin  20 mg Oral QHS   sodium chloride flush  3 mL Intravenous Q12H   Continuous Infusions:  sodium chloride     potassium chloride 10 mEq (07/26/19 1109)   PRN Meds: sodium chloride, acetaminophen, LORazepam, nitroGLYCERIN, ondansetron (ZOFRAN) IV, oxyCODONE, sodium chloride flush   Vital Signs    Vitals:   07/26/19 0529 07/26/19 0815 07/26/19 0819 07/26/19 1056  BP: 101/74 (!) 114/47 (!) 114/45 (!) 99/37  Pulse: 93 72 69 69  Resp: 19 19 13 14   Temp: (!) 97.3 F (36.3 C) 98.2 F (36.8 C)  (!) 97.3 F (36.3 C)  TempSrc: Oral Oral  Oral  SpO2: 100% 97% 96% 99%  Weight:      Height:        Intake/Output Summary (Last 24 hours) at 07/26/2019 1304 Last data filed at 07/25/2019 1500 Gross per 24 hour  Intake 655.91 ml  Output --  Net 655.91 ml   Filed Weights   07/24/19 1920 07/25/19 0136 07/25/19 1109  Weight: 53.5 kg 52 kg 52 kg    Telemetry    nsr - Personally Reviewed  ECG    nsr - Personally Reviewed  Physical Exam   GEN: 86 yo woman who looks younger than her stated age and in no acute distress.   Neck: No JVD Cardiac: RRR, no murmurs, rubs, or gallops.  Respiratory: Clear to auscultation bilaterally. GI: Soft, nontender, non-distended  MS: No edema; No deformity. Neuro:  Nonfocal  Psych: Normal affect   Labs    Chemistry Recent  Labs  Lab 07/24/19 1351 07/26/19 0341  NA 137 137  K 4.4 2.9*  CL 104 106  CO2 21* 21*  GLUCOSE 96 118*  BUN 10 17  CREATININE 0.78 0.83  CALCIUM 10.2 9.6  GFRNONAA >60 >60  GFRAA >60 >60  ANIONGAP 12 10     Hematology Recent Labs  Lab 07/24/19 1351 07/25/19 0720 07/26/19 0341  WBC 7.5 7.5 14.7*  RBC 4.08 4.05 3.83*  HGB 12.6 12.9 12.1  HCT 39.1 38.3 35.8*  MCV 95.8 94.6 93.5  MCH 30.9 31.9 31.6  MCHC 32.2 33.7 33.8  RDW 14.0 13.9 13.9  PLT 256 240 238    Cardiac EnzymesNo results for input(s): TROPONINI in the last 168 hours. No results for input(s): TROPIPOC in the last 168 hours.   BNPNo results for input(s): BNP, PROBNP in the last 168 hours.   DDimer No results for input(s): DDIMER in the last 168 hours.   Radiology    DG Chest 2 View  Result Date: 07/24/2019 CLINICAL DATA:  Chest pain and neck pain radiating to behind eyes for 4 days, history hypertension, COVID-19 EXAM: CHEST - 2 VIEW COMPARISON:  01/02/2019 FINDINGS: Normal heart size post  CABG. Mediastinal contours and pulmonary vascularity normal. Atherosclerotic calcification aorta. Tiny calcified granuloma mid RIGHT lung. Hyperinflation and bronchitic changes consistent with COPD. No acute infiltrate, pleural effusion or pneumothorax. Osseous structures unremarkable. IMPRESSION: Post CABG. COPD changes. No acute abnormalities. Electronically Signed   By: Lavonia Dana M.D.   On: 07/24/2019 14:33   DG Abd 1 View  Result Date: 07/25/2019 CLINICAL DATA:  Constipation and nausea EXAM: ABDOMEN - 1 VIEW COMPARISON:  07/08/2018 FINDINGS: Scattered large and small bowel gas is noted. Mild constipation is noted within the colon. No obstructive changes are seen. No acute bony abnormality is noted. Prior surgical changes in the right hip are seen. IMPRESSION: Mild constipation. Electronically Signed   By: Inez Catalina M.D.   On: 07/25/2019 00:49   CT Cervical Spine Wo Contrast  Result Date: 07/24/2019 CLINICAL DATA:   Left-sided neck pain radiating to head EXAM: CT CERVICAL SPINE WITHOUT CONTRAST TECHNIQUE: Multidetector CT imaging of the cervical spine was performed without intravenous contrast. Multiplanar CT image reconstructions were also generated. COMPARISON:  11/14/2016 FINDINGS: Alignment: Alignment is anatomic. Skull base and vertebrae: There are no acute displaced fractures. Soft tissues and spinal canal: No prevertebral fluid or swelling. No visible canal hematoma. Disc levels: There is mild multilevel cervical spondylosis, with disc space narrowing most pronounced at C4-5, C5-6, and C6-7. Mild right predominant uncovertebral hypertrophy at C5-6 and C6-7 results in minimal right neural foraminal encroachment. Central canal remains patent. Upper chest: Airway is patent.  Lung apices are clear. Other: Reconstructed images demonstrate no additional findings. IMPRESSION: 1. Mild cervical spondylosis from C4-5 through C6-7. 2. No acute bony abnormality. Electronically Signed   By: Randa Ngo M.D.   On: 07/24/2019 20:01   CARDIAC CATHETERIZATION  Result Date: 07/25/2019  Prox RCA to Mid RCA lesion is 40% stenosed.  Ost LM to Mid LM lesion is 60% stenosed.  Mid Cx lesion is 99% stenosed.  SVG graft was visualized by angiography and is normal in caliber.  The graft exhibits no disease.  SVG graft was visualized by angiography and is normal in caliber.  The graft exhibits no disease.  Mid LAD lesion is 70% stenosed.  Severe double vessel CAD Moderately severe ostial left main stenosis Severe stenosis mid LAD. The SVG to the LAD is patent. The LIMA does not appear to have been used despite the findings in the operative report. The LIMA is small and atretic. Severe stenosis mid Circumflex. OM and OM2 fill from the patent vein graft. Mild disease mid RCA Recommendations: Continue medical management of CAD. Will start lactulose for constipation. She could be discharged later today after bedrest if she is stable.    ECHOCARDIOGRAM COMPLETE  Result Date: 07/25/2019    ECHOCARDIOGRAM REPORT   Patient Name:   Leah Pittman Date of Exam: 07/25/2019 Medical Rec #:  [id number]       Height:       64.0 in Accession #:    [id number]      Weight:       114.6 lb Date of Birth:  27-Aug-1930      BSA:          1.544 m Patient Age:    80 years        BP:           122/43 mmHg Patient Gender: F               HR:  59 bpm. Exam Location:  Inpatient Procedure: 2D Echo, Cardiac Doppler and Color Doppler Indications:    R07.9* Chest pain, unspecified  History:        Patient has prior history of Echocardiogram examinations, most                 recent 07/10/2018. Signs/Symptoms:Dyspnea; Risk                 Factors:Dyslipidemia. Thyroid Disease. GERD.  Sonographer:    Elmarie Shiley Dance Referring Phys: [id number] VASUNDHRA RATHORE IMPRESSIONS  1. Abnormal septal motion inferior basal hypokinesis . Left ventricular ejection fraction, by estimation, is 50 to 55%. The left ventricle has low normal function. The left ventricle demonstrates regional wall motion abnormalities (see scoring diagram/findings for description). Left ventricular diastolic parameters are consistent with age-related delayed relaxation (normal).  2. Right ventricular systolic function is normal. The right ventricular size is normal.  3. The mitral valve is normal in structure. Trivial mitral valve regurgitation. No evidence of mitral stenosis.  4. The aortic valve is tricuspid. Aortic valve regurgitation is trivial. Mild aortic valve sclerosis is present, with no evidence of aortic valve stenosis.  5. The inferior vena cava is normal in size with greater than 50% respiratory variability, suggesting right atrial pressure of 3 mmHg. FINDINGS  Left Ventricle: Abnormal septal motion inferior basal hypokinesis. Left ventricular ejection fraction, by estimation, is 50 to 55%. The left ventricle has low normal function. The left ventricle demonstrates regional wall motion  abnormalities. The left ventricular internal cavity size was normal in size. There is no left ventricular hypertrophy. Left ventricular diastolic parameters are consistent with age-related delayed relaxation (normal). Right Ventricle: The right ventricular size is normal. No increase in right ventricular wall thickness. Right ventricular systolic function is normal. Left Atrium: Left atrial size was normal in size. Right Atrium: Right atrial size was normal in size. Pericardium: There is no evidence of pericardial effusion. Mitral Valve: The mitral valve is normal in structure. There is mild thickening of the mitral valve leaflet(s). There is mild calcification of the mitral valve leaflet(s). Normal mobility of the mitral valve leaflets. Trivial mitral valve regurgitation. No evidence of mitral valve stenosis. Tricuspid Valve: The tricuspid valve is normal in structure. Tricuspid valve regurgitation is trivial. No evidence of tricuspid stenosis. Aortic Valve: The aortic valve is tricuspid. Aortic valve regurgitation is trivial. Mild aortic valve sclerosis is present, with no evidence of aortic valve stenosis. Pulmonic Valve: The pulmonic valve was normal in structure. Pulmonic valve regurgitation is not visualized. No evidence of pulmonic stenosis. Aorta: The aortic root is normal in size and structure. Venous: The inferior vena cava is normal in size with greater than 50% respiratory variability, suggesting right atrial pressure of 3 mmHg. IAS/Shunts: No atrial level shunt detected by color flow Doppler.  LEFT VENTRICLE PLAX 2D LVIDd:         3.50 cm  Diastology LVIDs:         2.30 cm  LV e' lateral:   7.54 cm/s LV PW:         0.90 cm  LV E/e' lateral: 9.4 LV IVS:        0.80 cm  LV e' medial:    4.05 cm/s LVOT diam:     1.80 cm  LV E/e' medial:  17.5 LV SV:         49 LV SV Index:   32 LVOT Area:     2.54 cm  RIGHT VENTRICLE  IVC RV Basal diam:  2.40 cm    IVC diam: 1.50 cm RV Mid diam:    1.20 cm RV S  prime:     6.81 cm/s TAPSE (M-mode): 1.1 cm LEFT ATRIUM             Index       RIGHT ATRIUM          Index LA diam:        3.40 cm 2.20 cm/m  RA Area:     5.82 cm LA Vol (A2C):   32.3 ml 20.92 ml/m RA Volume:   6.62 ml  4.29 ml/m LA Vol (A4C):   20.1 ml 13.02 ml/m LA Biplane Vol: 27.5 ml 17.81 ml/m  AORTIC VALVE LVOT Vmax:   97.90 cm/s LVOT Vmean:  56.200 cm/s LVOT VTI:    0.193 m  AORTA Ao Root diam: 2.90 cm Ao Asc diam:  2.60 cm MITRAL VALVE MV Area (PHT): 1.99 cm    SHUNTS MV Decel Time: 382 msec    Systemic VTI:  0.19 m MV E velocity: 70.70 cm/s  Systemic Diam: 1.80 cm MV A velocity: 93.40 cm/s MV E/A ratio:  0.76 Charlton Haws MD Electronically signed by Charlton Haws MD Signature Date/Time: 07/25/2019/3:11:32 PM    Final     Cardiac Studies   S/p left heart cath, results reviewed  Patient Profile     86 y.o. female admitted with chest pain and underwent left heart cath.   Assessment & Plan    1. Chest pain - the etiology is unclear. It continues. Results of heart cath noted. 2. 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 3. Hypokalemia - she has been given potassium supplementation. We will recheck tomorrow 4. Disp. - she is close to DC. I will ask her to ambulate in the halls.  Icarus Partch,M.D.     For questions or updates, please contact [facility] Please consult [email or web address] for contact info under Cardiology/STEMI.      Signed, Lewayne Bunting, MD  07/26/2019, 1:04 PM  Patient ID: [name], female   DOB: 01-03-1931, 86 y.o.   MRN: [id number]

## 2019-07-26 NOTE — Progress Notes (Signed)
PT Cancellation Note  Patient Details Name: Leah Pittman MRN: [id number] DOB: 1930/07/25   Cancelled Treatment:    Reason Eval/Treat Not Completed: Other (comment).  Pt is eating her breakfast but is struggling to finish due to having to call mult times for the full meal.  Will reattempt at another time.   Ivar Drape 07/26/2019, 10:22 AM   Samul Dada, PT MS Acute Rehab Dept. Number: [facility] [id number] and [facility] [phone]

## 2019-07-27 LAB — MAGNESIUM: Magnesium: 2 mg/dL (ref 1.7–2.4)

## 2019-07-27 LAB — BASIC METABOLIC PANEL
Anion gap: 6 (ref 5–15)
BUN: 15 mg/dL (ref 8–23)
CO2: 21 mmol/L — ABNORMAL LOW (ref 22–32)
Calcium: 9.2 mg/dL (ref 8.9–10.3)
Chloride: 109 mmol/L (ref 98–111)
Creatinine, Ser: 0.76 mg/dL (ref 0.44–1.00)
GFR calc Af Amer: >60 mL/min (ref >60)
GFR calc non Af Amer: >60 mL/min (ref >60)
Glucose, Bld: 99 mg/dL (ref 70–99)
Potassium: 4.7 mmol/L (ref 3.5–5.1)
Sodium: 136 mmol/L (ref 135–145)

## 2019-07-27 LAB — CBC
HCT: 32.1 % — ABNORMAL LOW (ref 36.0–46.0)
Hemoglobin: 10.6 g/dL — ABNORMAL LOW (ref 12.0–15.0)
MCH: 31.4 pg (ref 26.0–34.0)
MCHC: 33 g/dL (ref 30.0–36.0)
MCV: 95 fL (ref 80.0–100.0)
Platelets: 223 10*3/uL (ref 150–400)
RBC: 3.38 MIL/uL — ABNORMAL LOW (ref 3.87–5.11)
RDW: 14.2 % (ref 11.5–15.5)
WBC: 9.3 10*3/uL (ref 4.0–10.5)
nRBC: 0 % (ref 0.0–0.2)

## 2019-07-27 NOTE — Progress Notes (Signed)
Progress Note  Patient Name: ZAYLIA RIOLO Date of Encounter: 07/27/2019  Primary Cardiologist: Thurmon Fair, MD   Subjective   Still with multiple somatic complaints.  Inpatient Medications    Scheduled Meds: . aspirin EC  81 mg Oral Daily  . cholecalciferol  2,000 Units Oral Daily  . diclofenac Sodium  2 g Topical QID  . dorzolamide-timolol  1 drop Left Eye BID  . lactulose  30 g Oral Daily  . latanoprost  1 drop Left Eye QHS  . levothyroxine  100 mcg Oral Daily  . losartan  25 mg Oral Daily  . magnesium hydroxide  30 mL Oral Daily  . pantoprazole  40 mg Oral Daily  . simvastatin  20 mg Oral QHS  . sodium chloride flush  3 mL Intravenous Q12H   Continuous Infusions: . sodium chloride     PRN Meds: sodium chloride, acetaminophen, LORazepam, nitroGLYCERIN, ondansetron (ZOFRAN) IV, oxyCODONE, sodium chloride flush   Vital Signs    Vitals:   07/26/19 2026 07/27/19 0004 07/27/19 0545 07/27/19 0811  BP: (!) 129/42 (!) 102/50 (!) 101/47 (!) 123/46  Pulse: 75 70 63 64  Resp: 17 10 16 18   Temp: 97.9 F (36.6 C) 97.6 F (36.4 C) 98.1 F (36.7 C) (!) 97.5 F (36.4 C)  TempSrc: Oral Oral Oral Oral  SpO2: 100% 97% 96% 98%  Weight:      Height:        Intake/Output Summary (Last 24 hours) at 07/27/2019 1039 Last data filed at 07/26/2019 1400 Gross per 24 hour  Intake 236.17 ml  Output --  Net 236.17 ml   Filed Weights   07/24/19 1920 07/25/19 0136 07/25/19 1109  Weight: 53.5 kg 52 kg 52 kg    Telemetry    nsr - Personally Reviewed  ECG    none - Personally Reviewed  Physical Exam   GEN: 86 yo woman looking younger than her stated age, no acute distress.   Neck: No JVD Cardiac: RRR, no murmurs, rubs, or gallops.  Respiratory: Clear to auscultation bilaterally. GI: Soft, nontender, non-distended  MS: No edema; No deformity. Neuro:  Nonfocal  Psych: Normal affect   Labs    Chemistry Recent Labs  Lab 07/24/19 1351 07/26/19 0341 07/27/19 0407   NA 137 137 136  K 4.4 2.9* 4.7  CL 104 106 109  CO2 21* 21* 21*  GLUCOSE 96 118* 99  BUN 10 17 15   CREATININE 0.78 0.83 0.76  CALCIUM 10.2 9.6 9.2  GFRNONAA >60 >60 >60  GFRAA >60 >60 >60  ANIONGAP 12 10 6      Hematology Recent Labs  Lab 07/25/19 0720 07/26/19 0341 07/27/19 0407  WBC 7.5 14.7* 9.3  RBC 4.05 3.83* 3.38*  HGB 12.9 12.1 10.6*  HCT 38.3 35.8* 32.1*  MCV 94.6 93.5 95.0  MCH 31.9 31.6 31.4  MCHC 33.7 33.8 33.0  RDW 13.9 13.9 14.2  PLT 240 238 223    Cardiac EnzymesNo results for input(s): TROPONINI in the last 168 hours. No results for input(s): TROPIPOC in the last 168 hours.   BNPNo results for input(s): BNP, PROBNP in the last 168 hours.   DDimer No results for input(s): DDIMER in the last 168 hours.   Radiology    CARDIAC CATHETERIZATION  Result Date: 07/25/2019  Prox RCA to Mid RCA lesion is 40% stenosed.  Ost LM to Mid LM lesion is 60% stenosed.  Mid Cx lesion is 99% stenosed.  SVG graft was visualized by  angiography and is normal in caliber.  The graft exhibits no disease.  SVG graft was visualized by angiography and is normal in caliber.  The graft exhibits no disease.  Mid LAD lesion is 70% stenosed.  Severe double vessel CAD Moderately severe ostial left main stenosis Severe stenosis mid LAD. The SVG to the LAD is patent. The LIMA does not appear to have been used despite the findings in the operative report. The LIMA is small and atretic. Severe stenosis mid Circumflex. OM and OM2 fill from the patent vein graft. Mild disease mid RCA Recommendations: Continue medical management of CAD. Will start lactulose for constipation. She could be discharged later today after bedrest if she is stable.   ECHOCARDIOGRAM COMPLETE  Result Date: 07/25/2019    ECHOCARDIOGRAM REPORT   Patient Name:   DATHA KISSINGER Date of Exam: 07/25/2019 Medical Rec #:  [id number]       Height:       64.0 in Accession #:    [id number]      Weight:       114.6 lb Date of Birth:   [birth year]      BSA:          1.544 m Patient Age:    88 years        BP:           122/43 mmHg Patient Gender: F               HR:           59 bpm. Exam Location:  Inpatient Procedure: 2D Echo, Cardiac Doppler and Color Doppler Indications:    R07.9* Chest pain, unspecified  History:        Patient has prior history of Echocardiogram examinations, most                 recent 07/10/2018. Signs/Symptoms:Dyspnea; Risk                 Factors:Dyslipidemia. Thyroid Disease. GERD.  Sonographer:    Elmarie Shiley Dance Referring Phys: [id number] VASUNDHRA RATHORE IMPRESSIONS  1. Abnormal septal motion inferior basal hypokinesis . Left ventricular ejection fraction, by estimation, is 50 to 55%. The left ventricle has low normal function. The left ventricle demonstrates regional wall motion abnormalities (see scoring diagram/findings for description). Left ventricular diastolic parameters are consistent with age-related delayed relaxation (normal).  2. Right ventricular systolic function is normal. The right ventricular size is normal.  3. The mitral valve is normal in structure. Trivial mitral valve regurgitation. No evidence of mitral stenosis.  4. The aortic valve is tricuspid. Aortic valve regurgitation is trivial. Mild aortic valve sclerosis is present, with no evidence of aortic valve stenosis.  5. The inferior vena cava is normal in size with greater than 50% respiratory variability, suggesting right atrial pressure of 3 mmHg. FINDINGS  Left Ventricle: Abnormal septal motion inferior basal hypokinesis. Left ventricular ejection fraction, by estimation, is 50 to 55%. The left ventricle has low normal function. The left ventricle demonstrates regional wall motion abnormalities. The left ventricular internal cavity size was normal in size. There is no left ventricular hypertrophy. Left ventricular diastolic parameters are consistent with age-related delayed relaxation (normal). Right Ventricle: The right ventricular size is  normal. No increase in right ventricular wall thickness. Right ventricular systolic function is normal. Left Atrium: Left atrial size was normal in size. Right Atrium: Right atrial size was normal in size. Pericardium: There is no evidence of pericardial effusion. Mitral Valve: The  mitral valve is normal in structure. There is mild thickening of the mitral valve leaflet(s). There is mild calcification of the mitral valve leaflet(s). Normal mobility of the mitral valve leaflets. Trivial mitral valve regurgitation. No evidence of mitral valve stenosis. Tricuspid Valve: The tricuspid valve is normal in structure. Tricuspid valve regurgitation is trivial. No evidence of tricuspid stenosis. Aortic Valve: The aortic valve is tricuspid. Aortic valve regurgitation is trivial. Mild aortic valve sclerosis is present, with no evidence of aortic valve stenosis. Pulmonic Valve: The pulmonic valve was normal in structure. Pulmonic valve regurgitation is not visualized. No evidence of pulmonic stenosis. Aorta: The aortic root is normal in size and structure. Venous: The inferior vena cava is normal in size with greater than 50% respiratory variability, suggesting right atrial pressure of 3 mmHg. IAS/Shunts: No atrial level shunt detected by color flow Doppler.  LEFT VENTRICLE PLAX 2D LVIDd:         3.50 cm  Diastology LVIDs:         2.30 cm  LV e' lateral:   7.54 cm/s LV PW:         0.90 cm  LV E/e' lateral: 9.4 LV IVS:        0.80 cm  LV e' medial:    4.05 cm/s LVOT diam:     1.80 cm  LV E/e' medial:  17.5 LV SV:         49 LV SV Index:   32 LVOT Area:     2.54 cm  RIGHT VENTRICLE            IVC RV Basal diam:  2.40 cm    IVC diam: 1.50 cm RV Mid diam:    1.20 cm RV S prime:     6.81 cm/s TAPSE (M-mode): 1.1 cm LEFT ATRIUM             Index       RIGHT ATRIUM          Index LA diam:        3.40 cm 2.20 cm/m  RA Area:     5.82 cm LA Vol (A2C):   32.3 ml 20.92 ml/m RA Volume:   6.62 ml  4.29 ml/m LA Vol (A4C):   20.1 ml 13.02  ml/m LA Biplane Vol: 27.5 ml 17.81 ml/m  AORTIC VALVE LVOT Vmax:   97.90 cm/s LVOT Vmean:  56.200 cm/s LVOT VTI:    0.193 m  AORTA Ao Root diam: 2.90 cm Ao Asc diam:  2.60 cm MITRAL VALVE MV Area (PHT): 1.99 cm    SHUNTS MV Decel Time: 382 msec    Systemic VTI:  0.19 m MV E velocity: 70.70 cm/s  Systemic Diam: 1.80 cm MV A velocity: 93.40 cm/s MV E/A ratio:  0.76 Jenkins Rouge MD Electronically signed by Jenkins Rouge MD Signature Date/Time: 07/25/2019/3:11:32 PM    Final     Cardiac Studies   Cath and echo noted  Patient Profile     86 y.o. female admitted with chest pain, s/p left heart cath  Assessment & Plan    1. Chest pain - non-cardiac. She still has multiple pains, including chest pain. No additional workup indicated. 2. CAD - she is s/p CABG. She will need to follow with Dr. Angelena Form 3. Hypokalemia - repleted. She is doing well. She will need a bmp in the office in 7-10 days.  4. Disp. - she is stable for DC home. Followup with Dr. Angelena Form or Croitoru Wilson  will sign off.   For questions or updates, please contact [facility] Please consult [email or web address] for contact info under Cardiology/STEMI.      Signed, Lewayne Bunting, MD  07/27/2019, 10:39 AM  Patient ID: [name], female   DOB: [birth year], 86 y.o.   MRN: [id number]

## 2019-07-27 NOTE — Progress Notes (Signed)
D/c tele and IV. Went over AVS with pt and all answered were answered. Sent pt home with AVS, a printed prescription lactulose, 2 bottle of eye drops and voltagel cream.   Lawson Radar, RN

## 2019-07-27 NOTE — TOC Transition Note (Signed)
Transition of Care [facility]) - CM/SW Discharge Note   Patient Details  Name: GREG ECKRICH MRN: [id number] Date of Birth: 1930/11/02  Transition of Care [facility]) CM/SW Contact:  Deveron Furlong, RN 07/27/2019, 1:50 PM   Clinical Narrative:    Patient to d/c home with [facility].  Patient's address is [address] , [address], [place] [address].  Patient has used [facility] in the past and would like to use again.    Referral accepted by Cassie with [facility].    Final next level of care: Home w Home Health Services Barriers to Discharge: No Barriers Identified   Patient Goals and CMS Choice Patient states their goals for this hospitalization and ongoing recovery are:: to get home CMS Medicare.gov Compare Post Acute Care list provided to:: Patient Choice offered to / list presented to : Patient   Discharge Plan and Services      HH Arranged: PT, OT, Social Work [facility] Agency: [facility] Date [facility] Agency Contacted: 07/27/19 Time HH Agency Contacted: 1349 Representative spoke with at [facility] Agency: Cassie

## 2019-07-27 NOTE — Evaluation (Signed)
Physical Therapy Evaluation Patient Details Name: KRISTE BROMAN MRN: [id number] DOB: 26-Feb-1930 Today's Date: 07/27/2019   History of Present Illness  86 year old with past medical history significant for multiple episodes CAD status post CABG ( 06/2018 [facility], SVG- D1, sequential SVG OM- 1 OM -2) hypertension, hyperlipidemia, hypothyroidism who presents to the emergency department complaining of neck pain and subsequently developed chest pain with associated troponinemia.  Clinical Impression  Patient presents with decreased mobility due to decreased LE strength, decreased balance (with neuropathy, glaucoma,) and decreased activity tolerance.  She will benefit from skilled PT in the acute setting to allow return home with spouse supervision and follow up HHPT/HHaide help and intermittent assist from daughter.  PT to follow until d/c.     Follow Up Recommendations Home health PT(HH aide)    Equipment Recommendations  None recommended by PT    Recommendations for Other Services       Precautions / Restrictions Precautions Precautions: Fall Restrictions Weight Bearing Restrictions: No      Mobility  Bed Mobility Overal bed mobility: Needs Assistance Bed Mobility: Supine to Sit;Sit to Supine     Supine to sit: Supervision;HOB elevated Sit to supine: Supervision   General bed mobility comments: assist for safety  Transfers Overall transfer level: Needs assistance Equipment used: Rolling walker (2 wheeled) Transfers: Sit to/from Stand Sit to Stand: Supervision         General transfer comment: assist for safety  Ambulation/Gait Ambulation/Gait assistance: Min guard Gait Distance (Feet): 180 Feet Assistive device: Rolling walker (2 wheeled) Gait Pattern/deviations: Step-through pattern;Decreased stride length;Staggering left;Staggering right     General Gait Details: noted some knee buckling and some lateral LOB even with RW and minguard A needed for  safety  Stairs            Wheelchair Mobility    Modified Rankin (Stroke Patients Only)       Balance Overall balance assessment: Needs assistance Sitting-balance support: Feet supported Sitting balance-[name]: Good     Standing balance support: No upper extremity supported Standing balance-[name]: Fair Standing balance comment: standing initially no UE support                             Pertinent Vitals/Pain Pain Assessment: Faces Faces Pain Scale: Hurts little more Pain Location: Hands with trigger fingers Pain Descriptors / Indicators: Grimacing Pain Intervention(s): Monitored during session    Home Living Family/patient expects to be discharged to:: Private residence Living Arrangements: Spouse/significant other Available Help at Discharge: Family Type of Home: Other(Comment)(condo) Home Access: Level entry     Home Layout: One level Home Equipment: Grab bars - tub/shower;Grab bars - toilet;Walker - 2 wheels Additional Comments: spouse weak from cancer still, but can cook breakfast if needed, daughter helps with cleaning and meals usually    Prior Function                 Hand Dominance   Dominant Hand: Right    Extremity/Trunk Assessment   Upper Extremity Assessment Upper Extremity Assessment: Generalized weakness(R hand index and pinky trigger fingers, L hand middle and index trigger fingers)    Lower Extremity Assessment Lower Extremity Assessment: Generalized weakness(has neuropathy in feet)       Communication   Communication: No difficulties  Cognition Arousal/Alertness: Awake/alert Behavior During Therapy: WFL for tasks assessed/performed Overall Cognitive Status: Within Functional Limits for tasks assessed  General Comments General comments (skin integrity, edema, etc.): Discussed her neck pain which sounds as if in pattern of superficial cervical  nerve, discussed ROM exercies, heat vs ice and possible need for PT help once home.  Educated in safety for fall prevention including using walker, good footwear, lighting and spouse beside her initially.    Exercises     Assessment/Plan    PT Assessment Patient needs continued PT services  PT Problem List Decreased strength;Decreased mobility;Decreased balance;Decreased knowledge of use of DME;Decreased activity tolerance       PT Treatment Interventions DME instruction;Therapeutic activities;Gait training;Therapeutic exercise;Balance training;Functional mobility training;Patient/family education    PT Goals (Current goals can be found in the Care Plan section)  Acute Rehab PT Goals Patient Stated Goal: to go home, help get stronger PT Goal Formulation: With patient Time For Goal Achievement: 08/10/19 Potential to Achieve Goals: Good    Frequency Min 3X/week   Barriers to discharge        Co-evaluation               AM-PAC PT "6 Clicks" Mobility  Outcome Measure Help needed turning from your back to your side while in a flat bed without using bedrails?: None Help needed moving from lying on your back to sitting on the side of a flat bed without using bedrails?: None Help needed moving to and from a bed to a chair (including a wheelchair)?: A Little Help needed standing up from a chair using your arms (e.g., wheelchair or bedside chair)?: A Little Help needed to walk in hospital room?: A Little Help needed climbing 3-5 steps with a railing? : A Little 6 Click Score: 20    End of Session   Activity Tolerance: Patient tolerated treatment well Patient left: in bed   PT Visit Diagnosis: Other abnormalities of gait and mobility (R26.89);Muscle weakness (generalized) (M62.81)    Time: 0454-0981 PT Time Calculation (min) (ACUTE ONLY): 41 min   Charges:   PT Evaluation $PT Eval Moderate Complexity: 1 Mod PT Treatments $Gait Training: 8-22 mins $Self Care/Home  Management: 8-22        Sheran Lawless, PT Acute Rehabilitation Services [phone] 07/27/2019   Elray Mcgregor 07/27/2019, 12:29 PM

## 2019-07-27 NOTE — Discharge Summary (Signed)
Physician Discharge Summary  Leah Pittman VZC:[id number] DOB: October 29, 1930 DOA: 07/24/2019  PCP: Blane Ohara, MD  Admit date: 07/24/2019 Discharge date: 07/27/2019  Admitted From: Home  Disposition:  Home   Recommendations for Outpatient Follow-up:  1. Follow up with PCP in 1-2 weeks 2. Please obtain BMP/CBC in one week 3. Follow up with cardiology for further care chronic dyspnea.    Home Health: yes  Discharge Condition: Stable.  CODE STATUS: Full code Diet recommendation: Heart Healthy  Brief/Interim Summary: 86 year old with past medical history significant for multiple episodes CAD status post CABG ( 06/2018 LIMA LAD, SVG- D1, sequential SVG OM- 1 OM -2) hypertension, hyperlipidemia, hypothyroidism who presents to the emergency department complaining of neck pain and subsequently developed chest pain with associated troponinemia. Patient was complaining of neck pain, started 2 weeks prior to admission, patient think is related to her high blood pressure.  She will take her medications twice a week.  While patient was waiting in triage she suddenly developed substernal chest pain heaviness and shortness of breath. Systolic blood pressure was significantly elevated on arrival 230.  Chest x-ray negative for acute abnormality.  CT C spine mild cervical spondylosis from C4-5, C5 C 6-7.  No acute bony abnormality.   1-Chest pain, Known CAD. non-STEMI: Underwent cath which showed patent CABG Evaluated by cardiology medical management.  Stable for discharge.   2-Hypertensive emergency: BP improved on Home BP medications.   3-Neck pain; tylenol.   4-Constipation;  Had very small BM, add milk of magnesia Continue with lactulose.  Had multiples BM.   5-Hyperlipidemia: Continue with Zocor 6-Hypothyroidism: Continue with Synthroid. 7-GERD: Continue with PPI 8-anxiety: Continue with Ativan as needed 9-Hypokalemia; replaced.  10-leukocytosis; afebrile. resolved  Discharge  Diagnoses:  Principal Problem:   Chest pain Active Problems:   Anxiety   Hypercholesterolemia   Hypertensive emergency   Neck pain   NSTEMI (non-ST elevated myocardial infarction) (HCC)   Elevated troponin    Discharge Instructions  Discharge Instructions    Diet - low sodium heart healthy   Complete by: As directed    Diet - low sodium heart healthy   Complete by: As directed    Increase activity slowly   Complete by: As directed    Increase activity slowly   Complete by: As directed      Allergies as of 07/27/2019      Reactions   Benadryl [diphenhydramine] Swelling, Other (See Comments)   Tongue swells and hallucinates- could not talk   Codeine Nausea And Vomiting, Hypertension   Meperidine Nausea Only, Swelling, Other (See Comments)   Tongue swells and "I feel like death"   Prednisone    Hypertension    Promethazine Other (See Comments)   Hypotension   Propranolol Nausea And Vomiting   Shellfish Allergy Nausea And Vomiting   Tape Other (See Comments)   Tears the skin   Tazarotene Other (See Comments)   Reaction not recalled   Iodinated Diagnostic Agents Nausea Only, Rash      Medication List    STOP taking these medications   docusate sodium 100 MG capsule Commonly known as: COLACE   ferrous fumarate-b12-vitamic C-folic acid capsule Commonly known as: TRINSICON / FOLTRIN   Vitamin D (Ergocalciferol) 1.25 MG (50000 UNIT) Caps capsule Commonly known as: DRISDOL     TAKE these medications   acetaminophen 325 MG tablet Commonly known as: TYLENOL Take 975 mg by mouth every 8 (eight) hours as needed for mild pain or headache.  aspirin 81 MG tablet Take 81 mg by mouth daily.   CALCIUM CITRATE+D3 PETITES PO Take 1 tablet by mouth daily.   dorzolamide-timolol 22.3-6.8 MG/ML ophthalmic solution Commonly known as: COSOPT Place 1 drop into the left eye 2 (two) times daily.   HYDROcodone-acetaminophen 5-325 MG tablet Commonly known as:  NORCO/VICODIN Take 1 tablet by mouth every 6 (six) hours as needed for moderate pain.   lactulose 10 GM/15ML solution Commonly known as: CHRONULAC Take 45 mLs (30 g total) by mouth daily.   latanoprost 0.005 % ophthalmic solution Commonly known as: XALATAN Place 1 drop into the left eye at bedtime.   levothyroxine 100 MCG tablet Commonly known as: SYNTHROID TAKE 1 TABLET BY MOUTH EVERY DAY   LORazepam 1 MG tablet Commonly known as: ATIVAN Take 1 mg by mouth 2 (two) times daily as needed for anxiety.   losartan 25 MG tablet Commonly known as: COZAAR TAKE 1 TABLET(25 MG) BY MOUTH 1 TIME PER DAY What changed: See the new instructions.   magnesium oxide 400 MG tablet Commonly known as: MAG-OX Take 400 mg by mouth daily.   nitroGLYCERIN 0.4 MG SL tablet Commonly known as: NITROSTAT Place 0.4 mg under the tongue every 5 (five) minutes as needed for chest pain.   ondansetron 8 MG tablet Commonly known as: ZOFRAN Take 1 tablet (8 mg total) by mouth 2 (two) times daily as needed for nausea or vomiting.   pantoprazole 40 MG tablet Commonly known as: PROTONIX Take 1 tablet (40 mg total) by mouth daily.   potassium chloride SA 20 MEQ tablet Commonly known as: KLOR-CON Take 1 tablet (20 mEq total) by mouth daily.   PROBIOTIC DAILY PO Take 1 capsule by mouth daily.   simvastatin 20 MG tablet Commonly known as: ZOCOR Take 1 tablet (20 mg total) by mouth at bedtime.   Vitamin D (Cholecalciferol) 50 MCG (2000 UT) Caps Take 1 tablet by mouth daily.       Allergies  Allergen Reactions  . Benadryl [Diphenhydramine] Swelling and Other (See Comments)    Tongue swells and hallucinates- could not talk  . Codeine Nausea And Vomiting and Hypertension  . Meperidine Nausea Only, Swelling and Other (See Comments)    Tongue swells and "I feel like death"  . Prednisone     Hypertension   . Promethazine Other (See Comments)    Hypotension   . Propranolol Nausea And Vomiting  .  Shellfish Allergy Nausea And Vomiting  . Tape Other (See Comments)    Tears the skin  . Tazarotene Other (See Comments)    Reaction not recalled   . Iodinated Diagnostic Agents Nausea Only and Rash    Consultations:  Cardiology    Procedures/Studies: DG Chest 2 View  Result Date: 07/24/2019 CLINICAL DATA:  Chest pain and neck pain radiating to behind eyes for 4 days, history hypertension, COVID-19 EXAM: CHEST - 2 VIEW COMPARISON:  01/02/2019 FINDINGS: Normal heart size post CABG. Mediastinal contours and pulmonary vascularity normal. Atherosclerotic calcification aorta. Tiny calcified granuloma mid RIGHT lung. Hyperinflation and bronchitic changes consistent with COPD. No acute infiltrate, pleural effusion or pneumothorax. Osseous structures unremarkable. IMPRESSION: Post CABG. COPD changes. No acute abnormalities. Electronically Signed   By: Lavonia Dana M.D.   On: 07/24/2019 14:33   DG Abd 1 View  Result Date: 07/25/2019 CLINICAL DATA:  Constipation and nausea EXAM: ABDOMEN - 1 VIEW COMPARISON:  07/08/2018 FINDINGS: Scattered large and small bowel gas is noted. Mild constipation is noted within the  colon. No obstructive changes are seen. No acute bony abnormality is noted. Prior surgical changes in the right hip are seen. IMPRESSION: Mild constipation. Electronically Signed   By: Alcide Clever M.D.   On: 07/25/2019 00:49   CT Cervical Spine Wo Contrast  Result Date: 07/24/2019 CLINICAL DATA:  Left-sided neck pain radiating to head EXAM: CT CERVICAL SPINE WITHOUT CONTRAST TECHNIQUE: Multidetector CT imaging of the cervical spine was performed without intravenous contrast. Multiplanar CT image reconstructions were also generated. COMPARISON:  11/14/2016 FINDINGS: Alignment: Alignment is anatomic. Skull base and vertebrae: There are no acute displaced fractures. Soft tissues and spinal canal: No prevertebral fluid or swelling. No visible canal hematoma. Disc levels: There is mild multilevel  cervical spondylosis, with disc space narrowing most pronounced at C4-5, C5-6, and C6-7. Mild right predominant uncovertebral hypertrophy at C5-6 and C6-7 results in minimal right neural foraminal encroachment. Central canal remains patent. Upper chest: Airway is patent.  Lung apices are clear. Other: Reconstructed images demonstrate no additional findings. IMPRESSION: 1. Mild cervical spondylosis from C4-5 through C6-7. 2. No acute bony abnormality. Electronically Signed   By: Sharlet Salina M.D.   On: 07/24/2019 20:01   CARDIAC CATHETERIZATION  Result Date: 07/25/2019  Prox RCA to Mid RCA lesion is 40% stenosed.  Ost LM to Mid LM lesion is 60% stenosed.  Mid Cx lesion is 99% stenosed.  SVG graft was visualized by angiography and is normal in caliber.  The graft exhibits no disease.  SVG graft was visualized by angiography and is normal in caliber.  The graft exhibits no disease.  Mid LAD lesion is 70% stenosed.  Severe double vessel CAD Moderately severe ostial left main stenosis Severe stenosis mid LAD. The SVG to the LAD is patent. The LIMA does not appear to have been used despite the findings in the operative report. The LIMA is small and atretic. Severe stenosis mid Circumflex. OM and OM2 fill from the patent vein graft. Mild disease mid RCA Recommendations: Continue medical management of CAD. Will start lactulose for constipation. She could be discharged later today after bedrest if she is stable.   ECHOCARDIOGRAM COMPLETE  Result Date: 07/25/2019    ECHOCARDIOGRAM REPORT   Patient Name:   SHIASIA PORRO Date of Exam: 07/25/2019 Medical Rec #:  [id number]       Height:       64.0 in Accession #:    [id number]      Weight:       114.6 lb Date of Birth:  1931-01-09      BSA:          1.544 m Patient Age:    86 years        BP:           122/43 mmHg Patient Gender: F               HR:           59 bpm. Exam Location:  Inpatient Procedure: 2D Echo, Cardiac Doppler and Color Doppler Indications:     R07.9* Chest pain, unspecified  History:        Patient has prior history of Echocardiogram examinations, most                 recent 07/10/2018. Signs/Symptoms:Dyspnea; Risk                 Factors:Dyslipidemia. Thyroid Disease. GERD.  Sonographer:    Elmarie Shiley Dance Referring Phys: [id number] John Giovanni IMPRESSIONS  1. Abnormal septal motion inferior basal hypokinesis . Left ventricular ejection fraction, by estimation, is 50 to 55%. The left ventricle has low normal function. The left ventricle demonstrates regional wall motion abnormalities (see scoring diagram/findings for description). Left ventricular diastolic parameters are consistent with age-related delayed relaxation (normal).  2. Right ventricular systolic function is normal. The right ventricular size is normal.  3. The mitral valve is normal in structure. Trivial mitral valve regurgitation. No evidence of mitral stenosis.  4. The aortic valve is tricuspid. Aortic valve regurgitation is trivial. Mild aortic valve sclerosis is present, with no evidence of aortic valve stenosis.  5. The inferior vena cava is normal in size with greater than 50% respiratory variability, suggesting right atrial pressure of 3 mmHg. FINDINGS  Left Ventricle: Abnormal septal motion inferior basal hypokinesis. Left ventricular ejection fraction, by estimation, is 50 to 55%. The left ventricle has low normal function. The left ventricle demonstrates regional wall motion abnormalities. The left ventricular internal cavity size was normal in size. There is no left ventricular hypertrophy. Left ventricular diastolic parameters are consistent with age-related delayed relaxation (normal). Right Ventricle: The right ventricular size is normal. No increase in right ventricular wall thickness. Right ventricular systolic function is normal. Left Atrium: Left atrial size was normal in size. Right Atrium: Right atrial size was normal in size. Pericardium: There is no evidence of  pericardial effusion. Mitral Valve: The mitral valve is normal in structure. There is mild thickening of the mitral valve leaflet(s). There is mild calcification of the mitral valve leaflet(s). Normal mobility of the mitral valve leaflets. Trivial mitral valve regurgitation. No evidence of mitral valve stenosis. Tricuspid Valve: The tricuspid valve is normal in structure. Tricuspid valve regurgitation is trivial. No evidence of tricuspid stenosis. Aortic Valve: The aortic valve is tricuspid. Aortic valve regurgitation is trivial. Mild aortic valve sclerosis is present, with no evidence of aortic valve stenosis. Pulmonic Valve: The pulmonic valve was normal in structure. Pulmonic valve regurgitation is not visualized. No evidence of pulmonic stenosis. Aorta: The aortic root is normal in size and structure. Venous: The inferior vena cava is normal in size with greater than 50% respiratory variability, suggesting right atrial pressure of 3 mmHg. IAS/Shunts: No atrial level shunt detected by color flow Doppler.  LEFT VENTRICLE PLAX 2D LVIDd:         3.50 cm  Diastology LVIDs:         2.30 cm  LV e' lateral:   7.54 cm/s LV PW:         0.90 cm  LV E/e' lateral: 9.4 LV IVS:        0.80 cm  LV e' medial:    4.05 cm/s LVOT diam:     1.80 cm  LV E/e' medial:  17.5 LV SV:         49 LV SV Index:   32 LVOT Area:     2.54 cm  RIGHT VENTRICLE            IVC RV Basal diam:  2.40 cm    IVC diam: 1.50 cm RV Mid diam:    1.20 cm RV S prime:     6.81 cm/s TAPSE (M-mode): 1.1 cm LEFT ATRIUM             Index       RIGHT ATRIUM          Index LA diam:        3.40 cm 2.20 cm/m  RA Area:  5.82 cm LA Vol (A2C):   32.3 ml 20.92 ml/m RA Volume:   6.62 ml  4.29 ml/m LA Vol (A4C):   20.1 ml 13.02 ml/m LA Biplane Vol: 27.5 ml 17.81 ml/m  AORTIC VALVE LVOT Vmax:   97.90 cm/s LVOT Vmean:  56.200 cm/s LVOT VTI:    0.193 m  AORTA Ao Root diam: 2.90 cm Ao Asc diam:  2.60 cm MITRAL VALVE MV Area (PHT): 1.99 cm    SHUNTS MV Decel Time: 382  msec    Systemic VTI:  0.19 m MV E velocity: 70.70 cm/s  Systemic Diam: 1.80 cm MV A velocity: 93.40 cm/s MV E/A ratio:  0.76 Charlton HawsPeter Nishan MD Electronically signed by Charlton HawsPeter Nishan MD Signature Date/Time: 07/25/2019/3:11:32 PM    Final       Subjective: Patient is alert, she is feeling well.  She is still report at times mild shortness of breath on exertion.  She had multiple bowel movement.  She feels better in that regards  Discharge Exam: Vitals:   07/27/19 0545 07/27/19 0811  BP: (!) 101/47 (!) 123/46  Pulse: 63 64  Resp: 16 18  Temp: 98.1 F (36.7 C) (!) 97.5 F (36.4 C)  SpO2: 96% 98%     General: Pt is alert, awake, not in acute distress Cardiovascular: RRR, S1/S2 +, no rubs, no gallops Respiratory: CTA bilaterally, no wheezing, no rhonchi Abdominal: Soft, NT, ND, bowel sounds + Extremities: no edema, no cyanosis    The results of significant diagnostics from this hospitalization (including imaging, microbiology, ancillary and laboratory) are listed below for reference.     Microbiology: Recent Results (from the past 240 hour(s))  SARS Coronavirus 2 by RT PCR (hospital order, performed in [facility] hospital lab) Nasopharyngeal Nasopharyngeal Swab     Status: None   Collection Time: 07/24/19  9:40 PM   Specimen: Nasopharyngeal Swab  Result Value Ref Range Status   SARS Coronavirus 2 NEGATIVE NEGATIVE Final    Comment: (NOTE) SARS-CoV-2 target nucleic acids are NOT DETECTED. The SARS-CoV-2 RNA is generally detectable in upper and lower respiratory specimens during the acute phase of infection. The lowest concentration of SARS-CoV-2 viral copies this assay can detect is 250 copies / mL. A negative result does not preclude SARS-CoV-2 infection and should not be used as the sole basis for treatment or other patient management decisions.  A negative result may occur with improper specimen collection / handling, submission of specimen other than nasopharyngeal swab,  presence of viral mutation(s) within the areas targeted by this assay, and inadequate number of viral copies (<250 copies / mL). A negative result must be combined with clinical observations, patient history, and epidemiological information. Fact Sheet for Patients:   BoilerBrush.com.cyhttps://www.fda.gov/media/136312/download Fact Sheet for Healthcare Providers: https://pope.com/https://www.fda.gov/media/136313/download This test is not yet approved or cleared  by the [place] FDA and has been authorized for detection and/or diagnosis of SARS-CoV-2 by FDA under an Emergency Use Authorization (EUA).  This EUA will remain in effect (meaning this test can be used) for the duration of the COVID-19 declaration under Section 564(b)(1) of the Act, 21 U.S.C. section 360bbb-3(b)(1), unless the authorization is terminated or revoked sooner. Performed at [facility], [address] [place], [place] [address]      Labs: BNP (last 3 results) No results for input(s): BNP in the last 8760 hours. Basic Metabolic Panel: Recent Labs  Lab 07/24/19 1351 07/26/19 0341 07/27/19 0407  NA 137 137 136  K 4.4 2.9* 4.7  CL 104 106 109  CO2 21* 21* 21*  GLUCOSE 96 118* 99  BUN CREATININE 0.78 0.83 0.76  CALCIUM 10.2 9.6 9.2  MG  --  2.0 2.0   Liver Function Tests: No results for input(s): AST, ALT, ALKPHOS, BILITOT, PROT, ALBUMIN in the last 168 hours. No results for input(s): LIPASE, AMYLASE in the last 168 hours. No results for input(s): AMMONIA in the last 168 hours. CBC: Recent Labs  Lab 07/24/19 1351 07/25/19 0720 07/26/19 0341 07/27/19 0407  WBC 7.5 7.5 14.7* 9.3  HGB 12.6 12.9 12.1 10.6*  HCT 39.1 38.3 35.8* 32.1*  MCV 95.8 94.6 93.5 95.0  PLT 256 240 238 223   Cardiac Enzymes: No results for input(s): CKTOTAL, CKMB, CKMBINDEX, TROPONINI in the last 168 hours. BNP: Invalid input(s): POCBNP CBG: No results for input(s): GLUCAP in the last 168 hours. D-Dimer No results for input(s):  DDIMER in the last 72 hours. Hgb A1c No results for input(s): HGBA1C in the last 72 hours. Lipid Profile No results for input(s): CHOL, HDL, LDLCALC, TRIG, CHOLHDL, LDLDIRECT in the last 72 hours. Thyroid function studies No results for input(s): TSH, T4TOTAL, T3FREE, THYROIDAB in the last 72 hours.  Invalid input(s): FREET3 Anemia work up No results for input(s): VITAMINB12, FOLATE, FERRITIN, TIBC, IRON, RETICCTPCT in the last 72 hours. Urinalysis    Component Value Date/Time   COLORURINE STRAW (A) 07/09/2018 1400   APPEARANCEUR CLEAR 07/09/2018 1400   LABSPEC 1.010 07/09/2018 1400   PHURINE 6.0 07/09/2018 1400   GLUCOSEU NEGATIVE 07/09/2018 1400   HGBUR SMALL (A) 07/09/2018 1400   BILIRUBINUR NEGATIVE 07/09/2018 1400   KETONESUR NEGATIVE 07/09/2018 1400   PROTEINUR NEGATIVE 07/09/2018 1400   NITRITE NEGATIVE 07/09/2018 1400   LEUKOCYTESUR NEGATIVE 07/09/2018 1400   Sepsis Labs Invalid input(s): PROCALCITONIN,  WBC,  LACTICIDVEN Microbiology Recent Results (from the past 240 hour(s))  SARS Coronavirus 2 by RT PCR (hospital order, performed in [facility] hospital lab) Nasopharyngeal Nasopharyngeal Swab     Status: None   Collection Time: 07/24/19  9:40 PM   Specimen: Nasopharyngeal Swab  Result Value Ref Range Status   SARS Coronavirus 2 NEGATIVE NEGATIVE Final    Comment: (NOTE) SARS-CoV-2 target nucleic acids are NOT DETECTED. The SARS-CoV-2 RNA is generally detectable in upper and lower respiratory specimens during the acute phase of infection. The lowest concentration of SARS-CoV-2 viral copies this assay can detect is 250 copies / mL. A negative result does not preclude SARS-CoV-2 infection and should not be used as the sole basis for treatment or other patient management decisions.  A negative result may occur with improper specimen collection / handling, submission of specimen other than nasopharyngeal swab, presence of viral mutation(s) within the areas targeted  by this assay, and inadequate number of viral copies (<250 copies / mL). A negative result must be combined with clinical observations, patient history, and epidemiological information. Fact Sheet for Patients:   BoilerBrush.com.cy Fact Sheet for Healthcare Providers: https://pope.com/ This test is not yet approved or cleared  by the [place] FDA and has been authorized for detection and/or diagnosis of SARS-CoV-2 by FDA under an Emergency Use Authorization (EUA).  This EUA will remain in effect (meaning this test can be used) for the duration of the COVID-19 declaration under Section 564(b)(1) of the Act, 21 U.S.C. section 360bbb-3(b)(1), unless the authorization is terminated or revoked sooner. Performed at [facility], [address], [place], [place] [address]  Time coordinating discharge: 40 minutes  SIGNED:   Elmarie Shiley, MD  [facility]

## 2019-07-28 MED FILL — Heparin Sod (Porcine)-NaCl IV Soln 1000 Unit/500ML-0.9%: INTRAVENOUS | Qty: 1000 | Status: AC

## 2019-07-31 ENCOUNTER — Telehealth: Payer: Self-pay | Admitting: Cardiovascular Disease

## 2019-07-31 NOTE — Telephone Encounter (Signed)
Spoke with Judie Grieve and he will contact her PCP Dr. Sedalia Muta for OT orders.. he is seeing her for low vision issues and is planning to help her with mobility in her home setting. She also has an appt with her PCP 08/06/19.

## 2019-07-31 NOTE — Telephone Encounter (Signed)
Judie Grieve returning call.

## 2019-07-31 NOTE — Telephone Encounter (Signed)
New Message  Per Judie Grieve need verbal order twice a week for 2 week and one time a week for 2 weeks for strength, balance and endurance as low vision techniques to improve patient self care and housekeeping. If not available Arlys John wants verbal order left on vm.

## 2019-07-31 NOTE — Telephone Encounter (Signed)
LM for [facility] Occupational therapist with [facility] [phone]

## 2019-08-04 ENCOUNTER — Other Ambulatory Visit: Payer: Self-pay

## 2019-08-04 ENCOUNTER — Telehealth: Payer: Self-pay

## 2019-08-04 NOTE — Telephone Encounter (Signed)
Spoke with patient's husband, he states that Leah Pittman is doing well.  No complaints of chest pain and he said that her BP has not been high.  He said he will have her return my call when he returns home.

## 2019-08-04 NOTE — Patient Outreach (Signed)
[facility]) Care Management  08/04/2019  Leah Pittman Sep 25, 1930 [id number]   Red Emmi: Date of red emmi:  08/01/2019 Reason for alert: Know who to call about changes in condition:  No  Placed call to patient and spoke with husband. Reviewed reason for call. Husband reports patient has follow up planned with primary MD in 2 days.  I reviewed with husband any changes in condition should be reported to MD. He voiced understanding. Husband reports that therapy has already arrived. Denies any other concern today.  PLAN: close case as no needs identified.   Rowe Pavy, RN, BSN, CEN [facility] [phone]

## 2019-08-04 NOTE — Telephone Encounter (Signed)
°  Transition Care Management Follow-up Telephone Call  SHREYA LACASSE 1930/12/24  Admit Date: 07/24/19 Discharge Date: 07/27/19 Diagnoses: Chest Pain, Hypertensive Emergency   2 day post discharge: 07/29/19 7 day post discharge: 08/03/19 14 day post discharge: 08/10/19  Leah Pittman was discharged from [facility] on 07/27/19 with the diagnoses listed above.  She was contacted on June 8 via telephone in regards to transition of care.  Patient did not answer the phone so a voice mail was left.  She went to the ED with complaints of neck pain starting two weeks prior.  While in the ED she complained of sudden onset of chest pain and shortness of breath.  BP noted to be as high as 231/95 during that time.  BP on day of discharge was 121/36.  Abnormal Troponin: Component     Latest Ref Rng & Units 07/24/2019 07/24/2019 07/24/2019         1:51 PM  8:16 PM 11:35 PM  Troponin I (High Sensitivity)     <18 ng/L 2 79 (H) 459 (HH)   Neck pain was controled with Tylenol.  BP Controlled on home meds.  Patient states that she has been taking her medications twice a week.   Discharge BP Meds include:  Losartan 25 mg QD, Nitroglycerin PRN  Imaging/Results: -CXR- Post CABG. COPD changes. No acute abnormalities -Abdominal XR- Mild constipation -CT Cervical Spine w/o contrast- . Mild cervical spondylosis from C4-5 through C6-7. 2. No acute bony abnormality -Cardiac Cath- Prox RCA to Mid RCA lesion is 40% stenosed.  Ost LM to Mid LM lesion is 60% stenosed.  Mid Cx lesion is 99% stenosed -ECHO  Discharge Instructions:  -One week follow-up with Dr Sedalia Muta- Needs BMP/CBC -Follow-up with Cardiology  Items Reviewed:  Medications reviewed: yes  Allergies reviewed: yes  Dietary changes reviewed: yes  Referrals reviewed: yes    Confirmed importance and date/time of follow-up visits scheduled yes  Provider Appointment booked with Dr Sedalia Muta   Confirmed with patient if condition begins to worsen call PCP  or go to the ER.  Patient was given the office number and encouraged to call back with question or concerns.  : yes   Creola Corn, LPN 09/62/83 6:62 AM

## 2019-08-05 ENCOUNTER — Other Ambulatory Visit: Payer: Self-pay | Admitting: Family Medicine

## 2019-08-06 ENCOUNTER — Other Ambulatory Visit: Payer: Self-pay

## 2019-08-06 ENCOUNTER — Ambulatory Visit (INDEPENDENT_AMBULATORY_CARE_PROVIDER_SITE_OTHER): Payer: Medicare Other | Admitting: Family Medicine

## 2019-08-06 ENCOUNTER — Encounter: Payer: Self-pay | Admitting: Family Medicine

## 2019-08-06 VITALS — BP 130/78 | HR 68 | Temp 97.4°F | Ht 64.0 in | Wt 120.0 lb

## 2019-08-06 DIAGNOSIS — I161 Hypertensive emergency: Secondary | ICD-10-CM | POA: Diagnosis not present

## 2019-08-06 DIAGNOSIS — R079 Chest pain, unspecified: Secondary | ICD-10-CM

## 2019-08-06 DIAGNOSIS — I2510 Atherosclerotic heart disease of native coronary artery without angina pectoris: Secondary | ICD-10-CM

## 2019-08-06 DIAGNOSIS — M542 Cervicalgia: Secondary | ICD-10-CM | POA: Diagnosis not present

## 2019-08-06 DIAGNOSIS — E7800 Pure hypercholesterolemia, unspecified: Secondary | ICD-10-CM

## 2019-08-06 DIAGNOSIS — Z951 Presence of aortocoronary bypass graft: Secondary | ICD-10-CM

## 2019-08-06 MED ORDER — RANOLAZINE ER 500 MG PO TB12: 500.0000 mg | ORAL_TABLET | Freq: Two times a day (BID) | ORAL | 1 refills | Status: DC

## 2019-08-06 NOTE — Patient Instructions (Addendum)
Start on ranexa 500 mg one twice a day for heart disease.   Chronic idiopathic constipation. Stop lactulose.   Start amitiza 8 mcg one twice a day. I have sent a rx for this. Fill if the medicine is working for you.   Exercises for neck.   Neck Exercises Ask your health care provider which exercises are safe for you. Do exercises exactly as told by your health care provider and adjust them as directed. It is normal to feel mild stretching, pulling, tightness, or discomfort as you do these exercises. Stop right away if you feel sudden pain or your pain gets worse. Do not begin these exercises until told by your health care provider. Neck exercises can be important for many reasons. They can improve strength and maintain flexibility in your neck, which will help your upper back and prevent neck pain. Stretching exercises Rotation neck stretching  1. Sit in a chair or stand up. 2. Place your feet flat on the floor, shoulder width apart. 3. Slowly turn your head (rotate) to the right until a slight stretch is felt. Turn it all the way to the right so you can look over your right shoulder. Do not tilt or tip your head. 4. Hold this position for 10-30 seconds. 5. Slowly turn your head (rotate) to the left until a slight stretch is felt. Turn it all the way to the left so you can look over your left shoulder. Do not tilt or tip your head. 6. Hold this position for 10-30 seconds. Repeat __________ times. Complete this exercise __________ times a day. Neck retraction 1. Sit in a sturdy chair or stand up. 2. Look straight ahead. Do not bend your neck. 3. Use your fingers to push your chin backward (retraction). Do not bend your neck for this movement. Continue to face straight ahead. If you are doing the exercise properly, you will feel a slight sensation in your throat and a stretch at the back of your neck. 4. Hold the stretch for 1-2 seconds. Repeat __________ times. Complete this exercise  __________ times a day. Strengthening exercises Neck press 1. Lie on your back on a firm bed or on the floor with a pillow under your head. 2. Use your neck muscles to push your head down on the pillow and straighten your spine. 3. Hold the position as well as you can. Keep your head facing up (in a neutral position) and your chin tucked. 4. Slowly count to 5 while holding this position. Repeat __________ times. Complete this exercise __________ times a day. Isometrics These are exercises in which you strengthen the muscles in your neck while keeping your neck still (isometrics). 1. Sit in a supportive chair and place your hand on your forehead. 2. Keep your head and face facing straight ahead. Do not flex or extend your neck while doing isometrics. 3. Push forward with your head and neck while pushing back with your hand. Hold for 10 seconds. 4. Do the sequence again, this time putting your hand against the back of your head. Use your head and neck to push backward against the hand pressure. 5. Finally, do the same exercise on either side of your head, pushing sideways against the pressure of your hand. Repeat __________ times. Complete this exercise __________ times a day. Prone head lifts 1. Lie face-down (prone position), resting on your elbows so that your chest and upper back are raised. 2. Start with your head facing downward, near your chest. Position your  chin either on or near your chest. 3. Slowly lift your head upward. Lift until you are looking straight ahead. Then continue lifting your head as far back as you can comfortably stretch. 4. Hold your head up for 5 seconds. Then slowly lower it to your starting position. Repeat __________ times. Complete this exercise __________ times a day. Supine head lifts 1. Lie on your back (supine position), bending your knees to point to the ceiling and keeping your feet flat on the floor. 2. Lift your head slowly off the floor, raising your  chin toward your chest. 3. Hold for 5 seconds. Repeat __________ times. Complete this exercise __________ times a day. Scapular retraction 1. Stand with your arms at your sides. Look straight ahead. 2. Slowly pull both shoulders (scapulae) backward and downward (retraction) until you feel a stretch between your shoulder blades in your upper back. 3. Hold for 10-30 seconds. 4. Relax and repeat. Repeat __________ times. Complete this exercise __________ times a day. Contact a health care provider if:  Your neck pain or discomfort gets much worse when you do an exercise.  Your neck pain or discomfort does not improve within 2 hours after you exercise. If you have any of these problems, stop exercising right away. Do not do the exercises again unless your health care provider says that you can. Get help right away if:  You develop sudden, severe neck pain. If this happens, stop exercising right away. Do not do the exercises again unless your health care provider says that you can. This information is not intended to replace advice given to you by your health care provider. Make sure you discuss any questions you have with your health care provider. Document Revised: 12/05/2017 Document Reviewed: 12/05/2017 Elsevier Patient Education  2020 [employer].

## 2019-08-06 NOTE — Progress Notes (Signed)
Subjective:  Patient ID: Leah Pittman, female    DOB: August 25, 1930  Age: 86 y.o. MRN: [id number]  Chief Complaint  Patient presents with   Hospitalization Follow-up   Chest Pain   Neck Pain   Constipation    HPI Patient presents for a hospital follow up at [facility]. Presented to the ED on 07/24/2019 and discharged on 07/27/2019. She presented c/o of neck, chest pain, and constipation. Described neck pain in left anterior neck that started a few days prior to her admission. Intermittent chronic pain to posterior aspect of neck with intermittent spasms, pain was intermittent since onset. Intermittently feels sharp radiates behind left ear, vision worsens with pain. Denied any new numbness or weakness. When she got in the ED her neck pain changed into chest pain and shortness of breath BP was very high 231/95.  Principal Diagnoses: Chest Pain, Anxiety, hypertensive emergency, hyperlipidemia, NSTEMI, elevated troponin.    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 No acute bony abnormalities. Significant history of CABG in 06/2018. Described pain as dull ache and heaviness, shortness of breath and headache. Reported she did not take her blood pressure medications that morning. Initial troponin was negative, delta troponin was elevated to 79. Patient was advised to follow up with Cardiology. EKG was normal. Normal sinus rhythm nonspecific ST abnormality. Abnormal ECG since last tracing no significant change. Repeat EKG was unchanged. Chest X-ray was normal. Abdominal ultrasound did confirm patient had some constipation.  Current Outpatient Medications on File Prior to Visit  Medication Sig Dispense Refill   acetaminophen (TYLENOL) 325 MG tablet Take 975 mg by mouth every 8 (eight) hours as needed for mild pain or headache.      aspirin 81 MG tablet Take 81 mg by mouth daily.     Calcium Citrate-Vitamin D (CALCIUM CITRATE+D3 PETITES PO)  Take 1 tablet by mouth daily.     dorzolamide-timolol (COSOPT) 22.3-6.8 MG/ML ophthalmic solution Place 1 drop into the left eye 2 (two) times daily.      lactulose (CHRONULAC) 10 GM/15ML solution Take 45 mLs (30 g total) by mouth daily. 236 mL 0   latanoprost (XALATAN) 0.005 % ophthalmic solution Place 1 drop into the left eye at bedtime.     levothyroxine (SYNTHROID) 100 MCG tablet TAKE 1 TABLET BY MOUTH EVERY DAY (Patient taking differently: Take 100 mcg by mouth daily. ) 90 tablet 0   LORazepam (ATIVAN) 1 MG tablet Take 1 mg by mouth 2 (two) times daily as needed for anxiety.      losartan (COZAAR) 25 MG tablet TAKE 1 TABLET(25 MG) BY MOUTH 1 TIME PER DAY (Patient taking differently: Take 25 mg by mouth daily. ) 90 tablet 1   magnesium oxide (MAG-OX) 400 MG tablet Take 400 mg by mouth daily.     ondansetron (ZOFRAN) 8 MG tablet Take 1 tablet (8 mg total) by mouth 2 (two) times daily as needed for nausea or vomiting. 40 tablet 1   pantoprazole (PROTONIX) 40 MG tablet Take 1 tablet (40 mg total) by mouth daily. 90 tablet 0   Probiotic Product (PROBIOTIC DAILY PO) Take 1 capsule by mouth daily.     simvastatin (ZOCOR) 20 MG tablet TAKE 1 TABLET BY MOUTH EVERYDAY AT BEDTIME 90 tablet 0   Vitamin D, Cholecalciferol, 50 MCG (2000 UT) CAPS Take 1 tablet by mouth daily.     nitroGLYCERIN (NITROSTAT) 0.4 MG SL tablet Place 0.4 mg under the tongue every 5 (  five) minutes as needed for chest pain.  (Patient not taking: Reported on 08/06/2019)     No current facility-administered medications on file prior to visit.   Past Medical History:  Diagnosis Date   Anxiety    Arthritis    Chest pain 2011   CARDIOLITE - no significant symptoms, EKG changes, or arrhythmias   Dyspnea 02/16/2012   2D ECHO - EF 55-60%, normal   Fatigue    Glaucoma    Headache(784.0)    Hiatal hernia    High blood pressure 02/16/2012   RENAL DOPPLER - normal   Hyperlipidemia    Hypothyroidism     Labile hypertension    Lightheadedness    Migraine headache    Multiple allergies    Myalgia    Pain, lower leg    Calf   Problem of menstruation    Reflux    SOB (shortness of breath)    Thyroid disease    Urinary problem    Past Surgical History:  Procedure Laterality Date   ABDOMINAL HYSTERECTOMY  1976   CHOLECYSTECTOMY  2000   CORONARY ARTERY BYPASS GRAFT N/A 07/10/2018   Procedure: CORONARY ARTERY BYPASS GRAFTING (CABG), FREE LIMA;  Surgeon: [name], MD;  Location: [facility] OR;  Service: Open Heart Surgery;  Laterality: N/A;   CYSTECTOMY  1974   Intestine   CYSTECTOMY  1996   Brain stem   EYE SURGERY  2003   LEFT HEART CATH AND CORONARY ANGIOGRAPHY N/A 07/08/2018   Procedure: LEFT HEART CATH AND CORONARY ANGIOGRAPHY;  Surgeon: Runell Gess, MD;  Location: [facility] INVASIVE CV LAB;  Service: Cardiovascular;  Laterality: N/A;   LEFT HEART CATH AND CORONARY ANGIOGRAPHY N/A 07/25/2019   Procedure: LEFT HEART CATH AND CORONARY ANGIOGRAPHY;  Surgeon: Kathleene Hazel, MD;  Location: [facility] INVASIVE CV LAB;  Service: Cardiovascular;  Laterality: N/A;   TEE WITHOUT CARDIOVERSION N/A 07/10/2018   Procedure: TRANSESOPHAGEAL ECHOCARDIOGRAM (TEE);  Surgeon: [name], MD;  Location: [facility] OR;  Service: Open Heart Surgery;  Laterality: N/A;    Family History  Problem Relation Age of Onset   Heart attack Father    Stroke Father    Cancer Sister    Cancer Brother    Asthma Brother    Cancer Brother    Social History   Socioeconomic History   Marital status: Married    Spouse name: Not on file   Number of children: 3   Years of education: college   Highest education level: Not on file  Occupational History   Occupation: Retired  Tobacco Use   Smoking status: Never Smoker   Smokeless tobacco: Never Used  Substance and Sexual Activity   Alcohol use: No   Drug use: No   Sexual activity: Not on file  Other Topics Concern   Not on file    Social History Narrative   Lives with husband in [place], [place]   Social Determinants of Health   Financial Resource Strain:    Difficulty of Paying Living Expenses:   Food Insecurity:    Worried About Programme researcher, broadcasting/film/video in the Last Year:    Barista in the Last Year:   Transportation Needs:    Freight forwarder (Medical):    Lack of Transportation (Non-Medical):   Physical Activity:    Days of Exercise per Week:    Minutes of Exercise per Session:   Stress:    Feeling of Stress :   Social  Connections:    Frequency of Communication with Friends and Family:    Frequency of Social Gatherings with Friends and Family:    Attends Religious Services:    Active Member of Clubs or Organizations:    Attends Banker Meetings:    Marital Status:    Review of Systems  Constitutional: Negative for chills, fatigue and fever.  HENT: Negative for congestion, ear pain, rhinorrhea and sore throat.   Eyes: Positive for visual disturbance.  Respiratory: Positive for shortness of breath. Negative for cough.   Cardiovascular: Negative for chest pain.  Gastrointestinal: Positive for abdominal pain, constipation and nausea. Negative for diarrhea and vomiting.  Musculoskeletal: Positive for neck pain (throbbing). Negative for back pain and myalgias.  Neurological: Positive for numbness (BL legs from knees to feet). Negative for dizziness, weakness, light-headedness and headaches.   Objective:  BP 130/78    Pulse 68    Temp (!) 97.4 F (36.3 C)    Ht 5\' 4"  (1.626 m)    Wt 120 lb (54.4 kg)    SpO2 97%    BMI 20.60 kg/m   BP/Weight 08/06/2019 07/27/2019 07/25/2019  Systolic BP 130 121 -  Diastolic BP 78 36 -  Wt. (Lbs) 120 - 114.64  BMI 20.6 - 19.68    Physical Exam Vitals reviewed.  Constitutional:      Appearance: Normal appearance. She is normal weight.  Cardiovascular:     Rate and Rhythm: Normal rate and regular rhythm.     Pulses: Normal pulses.      Heart sounds: Normal heart sounds.  Pulmonary:     Effort: Pulmonary effort is normal. No respiratory distress.     Breath sounds: Normal breath sounds.  Abdominal:     General: Abdomen is flat. Bowel sounds are normal.     Palpations: Abdomen is soft.     Tenderness: There is no abdominal tenderness.  Musculoskeletal:     Comments: nontender over cspine. Tender over lateral neck.   Neurological:     Mental Status: She is alert and oriented to person, place, and time.  Psychiatric:        Mood and Affect: Mood normal.        Behavior: Behavior normal.     Diabetic Foot Exam - Simple   No data filed       Lab Results  Component Value Date   WBC 9.3 07/27/2019   HGB 10.6 (L) 07/27/2019   HCT 32.1 (L) 07/27/2019   PLT 223 07/27/2019   GLUCOSE 99 07/27/2019   CHOL 170 09/23/2018   TRIG 185 (H) 09/23/2018   HDL 64 09/23/2018   LDLCALC 69 09/23/2018   ALT 54 (H) 07/15/2018   AST 27 07/15/2018   NA 136 07/27/2019   K 4.7 07/27/2019   CL 109 07/27/2019   CREATININE 0.76 07/27/2019   BUN 15 07/27/2019   CO2 21 (L) 07/27/2019   TSH 0.441 (L) 09/23/2018   INR 1.7 (H) 07/10/2018   HGBA1C 5.8 (H) 07/10/2018      Assessment & Plan:  1. Hypertensive emergency Resolved. The current medical regimen is effective;  continue present plan and medications.  2. Coronary artery disease involving native coronary artery of native heart without angina pectoris Start on ranexa 500 mg one twice daily.  Follow up with cardiology  3. Chest pain Resolved.  4. Neck pain Exercises given.  5. S/P CABG x 4 grafts still patent.   6. Hypercholesterolemia Continue zocor. Continue low fat  diet and exercise.   7. Chronic idiopathic constipation.  Stop lactulose. Start Amitiza 8 mcg one twice a day.  Orders Placed This Encounter  Procedures   Comprehensive metabolic panel   CBC with Differential/Platelet   Ambulatory referral to Chronic Care Management Services     Follow-up:  Return in about 3 months (around 11/06/2019) for annual wellness visit sometime over the summer. 3 Month followup..  An After Visit Summary was printed and given to the patient.  Rochel Brome [facility] [phone]

## 2019-08-07 LAB — CBC WITH DIFFERENTIAL/PLATELET
Basophils Absolute: 0.1 10*3/uL (ref 0.0–0.2)
Basos: 1 %
EOS (ABSOLUTE): 0.5 10*3/uL — ABNORMAL HIGH (ref 0.0–0.4)
Eos: 5 %
Hematocrit: 36 % (ref 34.0–46.6)
Hemoglobin: 12 g/dL (ref 11.1–15.9)
Immature Grans (Abs): 0.1 10*3/uL (ref 0.0–0.1)
Immature Granulocytes: 1 %
Lymphocytes Absolute: 2.1 10*3/uL (ref 0.7–3.1)
Lymphs: 24 %
MCH: 31.2 pg (ref 26.6–33.0)
MCHC: 33.3 g/dL (ref 31.5–35.7)
MCV: 94 fL (ref 79–97)
Monocytes Absolute: 0.9 10*3/uL (ref 0.1–0.9)
Monocytes: 10 %
Neutrophils Absolute: 5.3 10*3/uL (ref 1.4–7.0)
Neutrophils: 59 %
Platelets: 235 10*3/uL (ref 150–450)
RBC: 3.85 x10E6/uL (ref 3.77–5.28)
RDW: 14 % (ref 11.7–15.4)
WBC: 9 10*3/uL (ref 3.4–10.8)

## 2019-08-07 LAB — COMPREHENSIVE METABOLIC PANEL
ALT: 12 IU/L (ref 0–32)
AST: 13 IU/L (ref 0–40)
Albumin/Globulin Ratio: 1.7 (ref 1.2–2.2)
Albumin: 4.2 g/dL (ref 3.6–4.6)
Alkaline Phosphatase: 74 IU/L (ref 48–121)
BUN/Creatinine Ratio: 13 (ref 12–28)
BUN: 12 mg/dL (ref 8–27)
Bilirubin Total: 0.3 mg/dL (ref 0.0–1.2)
CO2: 24 mmol/L (ref 20–29)
Calcium: 9.8 mg/dL (ref 8.7–10.3)
Chloride: 105 mmol/L (ref 96–106)
Creatinine, Ser: 0.94 mg/dL (ref 0.57–1.00)
GFR calc Af Amer: 64 mL/min/{1.73_m2} (ref >59)
GFR calc non Af Amer: 55 mL/min/{1.73_m2} — ABNORMAL LOW (ref >59)
Globulin, Total: 2.5 g/dL (ref 1.5–4.5)
Glucose: 91 mg/dL (ref 65–99)
Potassium: 4.4 mmol/L (ref 3.5–5.2)
Sodium: 139 mmol/L (ref 134–144)
Total Protein: 6.7 g/dL (ref 6.0–8.5)

## 2019-08-08 ENCOUNTER — Telehealth: Payer: Self-pay | Admitting: Family Medicine

## 2019-08-08 NOTE — Progress Notes (Signed)
°  Chronic Care Management   Note  08/08/2019 Name: Leah Pittman MRN: [id number] DOB: 09/01/1930  PINA SIRIANNI is a 86 y.o. year old female who is a primary care patient of [name], [name], MD. I reached out to [name] by phone today in response to a referral sent by Ms. Alben Spittle Garciaperez's PCP, [name], [name], MD.   Ms. Shampine was given information about Chronic Care Management services today including:  1. CCM service includes personalized support from designated clinical staff supervised by her physician, including individualized plan of care and coordination with other care providers 2. 24/7 contact phone numbers for assistance for urgent and routine care needs. 3. Service will only be billed when office clinical staff spend 20 minutes or more in a month to coordinate care. 4. Only one practitioner may furnish and bill the service in a calendar month. 5. The patient may stop CCM services at any time (effective at the end of the month) by phone call to the office staff.    Chronic Care Management   Outreach Note  08/08/2019 Name: [name] MRN: [id number] DOB: 1930/08/15  Referred by: Blane Ohara, MD Reason for referral : No chief complaint on file.   An unsuccessful telephone outreach was attempted today. The patient was referred to the pharmacist for assistance with care management and care coordination. This note is not being shared with the patient for the following reason: To respect privacy (The patient or proxy has requested that the information not be shared).  Follow Up Plan:   SIGNATURE  Follow up plan:   Lynnae January Upstream Scheduler

## 2019-08-13 ENCOUNTER — Telehealth: Payer: Self-pay | Admitting: Family Medicine

## 2019-08-13 NOTE — Progress Notes (Signed)
  Chronic Care Management   Outreach Note  08/13/2019 Name: Leah Pittman MRN: [id number] DOB: [birth year]  Referred by: Blane Ohara, MD Reason for referral : No chief complaint on file.   An unsuccessful telephone outreach was attempted today. The patient was referred to the pharmacist for assistance with care management and care coordination. This note is not being shared with the patient for the following reason: To respect privacy (The patient or proxy has requested that the information not be shared).  Follow Up Plan:   Lynnae January Upstream Scheduler

## 2019-08-20 ENCOUNTER — Ambulatory Visit (INDEPENDENT_AMBULATORY_CARE_PROVIDER_SITE_OTHER): Payer: Medicare Other | Admitting: Family Medicine

## 2019-08-20 ENCOUNTER — Other Ambulatory Visit: Payer: Self-pay | Admitting: Family Medicine

## 2019-08-20 ENCOUNTER — Other Ambulatory Visit: Payer: Self-pay

## 2019-08-20 VITALS — BP 136/70 | HR 72 | Temp 97.3°F | Resp 16 | Ht 64.0 in | Wt 119.8 lb

## 2019-08-20 DIAGNOSIS — Z79899 Other long term (current) drug therapy: Secondary | ICD-10-CM | POA: Insufficient documentation

## 2019-08-20 DIAGNOSIS — R1013 Epigastric pain: Secondary | ICD-10-CM | POA: Insufficient documentation

## 2019-08-20 DIAGNOSIS — J440 Chronic obstructive pulmonary disease with acute lower respiratory infection: Secondary | ICD-10-CM

## 2019-08-20 DIAGNOSIS — N3001 Acute cystitis with hematuria: Secondary | ICD-10-CM

## 2019-08-20 DIAGNOSIS — I10 Essential (primary) hypertension: Secondary | ICD-10-CM | POA: Diagnosis not present

## 2019-08-20 DIAGNOSIS — K5900 Constipation, unspecified: Secondary | ICD-10-CM | POA: Insufficient documentation

## 2019-08-20 DIAGNOSIS — R490 Dysphonia: Secondary | ICD-10-CM | POA: Diagnosis not present

## 2019-08-20 DIAGNOSIS — J449 Chronic obstructive pulmonary disease, unspecified: Secondary | ICD-10-CM

## 2019-08-20 DIAGNOSIS — G8929 Other chronic pain: Secondary | ICD-10-CM | POA: Insufficient documentation

## 2019-08-20 DIAGNOSIS — K581 Irritable bowel syndrome with constipation: Secondary | ICD-10-CM | POA: Insufficient documentation

## 2019-08-20 DIAGNOSIS — R0602 Shortness of breath: Secondary | ICD-10-CM | POA: Insufficient documentation

## 2019-08-20 HISTORY — DX: Chronic obstructive pulmonary disease with (acute) lower respiratory infection: J44.0

## 2019-08-20 LAB — POCT URINALYSIS DIPSTICK
Bilirubin, UA: NEGATIVE
Glucose, UA: NEGATIVE
Ketones, UA: NEGATIVE
Leukocytes, UA: NEGATIVE
Nitrite, UA: NEGATIVE
Protein, UA: POSITIVE — AB
Spec Grav, UA: 1.025 (ref 1.010–1.025)
Urobilinogen, UA: 0.2 E.U./dL
pH, UA: 6 (ref 5.0–8.0)

## 2019-08-20 NOTE — Patient Instructions (Signed)
Take Miralax and Dulcolax daily.Drink water with meals and limit caffeine.   Keep appointments with Throat specialist, Stomach specialist and Lung specialist Bring your daughter to pharmacy appointment to discuss medications Thyroid blood test today-we will call on results

## 2019-08-20 NOTE — Progress Notes (Signed)
Acute Office Visit  Subjective:    Patient ID: Leah Pittman, female    DOB: [birth year], 86 y.o.   MRN: [id number]  Chief Complaint  Patient presents with  . Follow-up UTI    HPI Patient is in today for a Hospital f/u at [facility] presented with constipation for several days. Pt states she continues to have BM with upper abdominal pain. Pt taking stool softeners prn.  Pt states she was seen by Dr. Chales Abrahams several years ago for abdominal pain but has not seen him since before COVID. Pt takes pantoprazole daily with prn ondansetron for nausea 1-2 times daily. Pt with KUB with large stool burden without evidence of obstruction. Pt with U/a showing 2+ occult blood and 30 RBC- asymptomatic, K 3.9-pt has not taken KCl given by the hospital at discharge.  Urine culture showed E coli-pt declined antibiotics -requesting repeat urinalysis prior to taking medication. Pt states no h/o of kidney stones and she is visually impaired and can not see if gross blood in urine. BP intermittently elevated-pt states Dr. Sedalia Muta told her to take bp meds "when needed".  Pt states she completing [facility] OT/PT.  Pt using oxydone but does not remember why medication was prescribed. Hypothyroid diagnosis -no recent TSH.  Pt continues to c/o being SOB with exertion-no h/o COPD/asthma-CXR shows lung changes and pt states SOB has not resolved even after Bypass surgery-echo with EF 6/21-60-65%-cardiology evaluation at [facility] due to CP. Pt states intermittent tightening. Pt continues to c/o hoarseness-pt states she has noted hoarseness since Bypass surgery-pt has not been evaluated with greater than 1 year of symptoms.  Past Medical History:  Diagnosis Date  . Anxiety   . Arthritis   . Chest pain 2011   CARDIOLITE - no significant symptoms, EKG changes, or arrhythmias  . Dyspnea 02/16/2012   2D ECHO - EF 55-60%, normal  . Fatigue   . Glaucoma   . Headache(784.0)   . Hiatal hernia   . High blood pressure 02/16/2012   RENAL DOPPLER -  normal  . Hyperlipidemia   . Hypothyroidism   . Labile hypertension   . Lightheadedness   . Migraine headache   . Multiple allergies   . Myalgia   . Pain, lower leg    Calf  . Problem of menstruation   . Reflux   . SOB (shortness of breath)   . Thyroid disease   . Urinary problem     Past Surgical History:  Procedure Laterality Date  . ABDOMINAL HYSTERECTOMY  1976  . CHOLECYSTECTOMY  2000  . CORONARY ARTERY BYPASS GRAFT N/A 07/10/2018   Procedure: CORONARY ARTERY BYPASS GRAFTING (CABG), FREE LIMA;  Surgeon: [name], MD;  Location: [facility] OR;  Service: Open Heart Surgery;  Laterality: N/A;  . CYSTECTOMY  1974   Intestine  . CYSTECTOMY  1996   Brain stem  . EYE SURGERY  2003  . LEFT HEART CATH AND CORONARY ANGIOGRAPHY N/A 07/08/2018   Procedure: LEFT HEART CATH AND CORONARY ANGIOGRAPHY;  Surgeon: Runell Gess, MD;  Location: [facility] INVASIVE CV LAB;  Service: Cardiovascular;  Laterality: N/A;  . LEFT HEART CATH AND CORONARY ANGIOGRAPHY N/A 07/25/2019   Procedure: LEFT HEART CATH AND CORONARY ANGIOGRAPHY;  Surgeon: Kathleene Hazel, MD;  Location: [facility] INVASIVE CV LAB;  Service: Cardiovascular;  Laterality: N/A;  . TEE WITHOUT CARDIOVERSION N/A 07/10/2018   Procedure: TRANSESOPHAGEAL ECHOCARDIOGRAM (TEE);  Surgeon: [name], MD;  Location: [facility] OR;  Service: Open Heart Surgery;  Laterality: N/A;    Family History  Problem Relation Age of Onset  . Heart attack Father   . Stroke Father   . Cancer Sister   . Cancer Brother   . Asthma Brother   . Cancer Brother     Social History   Socioeconomic History  . Marital status: Married    Spouse name: Not on file  . Number of children: 3  . Years of education: college  . Highest education level: Not on file  Occupational History  . Occupation: Retired  Tobacco Use  . Smoking status: Never Smoker  . Smokeless tobacco: Never Used  Substance and Sexual Activity  . Alcohol use: No  . Drug use: No  . Sexual  activity: Not on file  Other Topics Concern  . Not on file  Social History Narrative   Lives with husband in [place], [place]   Social Determinants of Health   Financial Resource Strain:   . Difficulty of Paying Living Expenses:   Food Insecurity:   . Worried About Programme researcher, broadcasting/film/video in the Last Year:   . Barista in the Last Year:   Transportation Needs:   . Freight forwarder (Medical):   Marland Kitchen Lack of Transportation (Non-Medical):   Physical Activity:   . Days of Exercise per Week:   . Minutes of Exercise per Session:   Stress:   . Feeling of Stress :   Social Connections:   . Frequency of Communication with Friends and Family:   . Frequency of Social Gatherings with Friends and Family:   . Attends Religious Services:   . Active Member of Clubs or Organizations:   . Attends Banker Meetings:   Marland Kitchen Marital Status:   Intimate Partner Violence:   . Fear of Current or Ex-Partner:   . Emotionally Abused:   Marland Kitchen Physically Abused:   . Sexually Abused:     Outpatient Medications Prior to Visit  Medication Sig Dispense Refill  . acetaminophen (TYLENOL) 325 MG tablet Take 975 mg by mouth every 8 (eight) hours as needed for mild pain or headache.     Marland Kitchen aspirin 81 MG tablet Take 81 mg by mouth daily.    . Calcium Citrate-Vitamin D (CALCIUM CITRATE+D3 PETITES PO) Take 1 tablet by mouth daily.    . dorzolamide-timolol (COSOPT) 22.3-6.8 MG/ML ophthalmic solution Place 1 drop into the left eye 2 (two) times daily.     Marland Kitchen latanoprost (XALATAN) 0.005 % ophthalmic solution Place 1 drop into the left eye at bedtime.    Marland Kitchen levothyroxine (SYNTHROID) 100 MCG tablet TAKE 1 TABLET BY MOUTH EVERY DAY (Patient taking differently: Take 100 mcg by mouth daily. ) 90 tablet 0  . LORazepam (ATIVAN) 1 MG tablet Take 1 mg by mouth 2 (two) times daily as needed for anxiety.     Marland Kitchen losartan (COZAAR) 25 MG tablet TAKE 1 TABLET(25 MG) BY MOUTH 1 TIME PER DAY (Patient taking differently: Take 25 mg  by mouth daily. ) 90 tablet 1  . magnesium oxide (MAG-OX) 400 MG tablet Take 400 mg by mouth daily.    . nitroGLYCERIN (NITROSTAT) 0.4 MG SL tablet Place 0.4 mg under the tongue every 5 (five) minutes as needed for chest pain.  (Patient not taking: Reported on 08/06/2019)    . ondansetron (ZOFRAN) 8 MG tablet Take 1 tablet (8 mg total) by mouth 2 (two) times daily as needed for nausea or vomiting. 40 tablet 1  . pantoprazole (  PROTONIX) 40 MG tablet Take 1 tablet (40 mg total) by mouth daily. 90 tablet 0  . Probiotic Product (PROBIOTIC DAILY PO) Take 1 capsule by mouth daily.    . ranolazine (RANEXA) 500 MG 12 hr tablet Take 1 tablet (500 mg total) by mouth 2 (two) times daily. 60 tablet 1  . simvastatin (ZOCOR) 20 MG tablet TAKE 1 TABLET BY MOUTH EVERYDAY AT BEDTIME 90 tablet 0  . Vitamin D, Cholecalciferol, 50 MCG (2000 UT) CAPS Take 1 tablet by mouth daily.    Marland Kitchen lactulose (CHRONULAC) 10 GM/15ML solution Take 45 mLs (30 g total) by mouth daily. 236 mL 0   No facility-administered medications prior to visit.    Allergies  Allergen Reactions  . Benadryl [Diphenhydramine] Swelling and Other (See Comments)    Tongue swells and hallucinates- could not talk  . Codeine Nausea And Vomiting and Hypertension  . Meperidine Nausea Only, Swelling and Other (See Comments)    Tongue swells and "I feel like death"  . Prednisone     Hypertension   . Promethazine Other (See Comments)    Hypotension   . Propranolol Nausea And Vomiting  . Shellfish Allergy Nausea And Vomiting  . Tape Other (See Comments)    Tears the skin  . Tazarotene Other (See Comments)    Reaction not recalled   . Iodinated Diagnostic Agents Nausea Only and Rash    Review of Systems  Constitutional: Positive for chills and fatigue. Negative for fever.  HENT: Negative for congestion, ear pain and sore throat.   Eyes:       Glaucoma-visual impaired  Respiratory: Positive for cough and shortness of breath (with exertion).     Cardiovascular: Positive for chest pain. Negative for palpitations and leg swelling.  Gastrointestinal: Positive for abdominal distention, abdominal pain, constipation and nausea.  Genitourinary: Positive for hematuria. Negative for dysuria and frequency.  Musculoskeletal: Positive for gait problem.  Neurological: Positive for dizziness and headaches.  Psychiatric/Behavioral: Negative for decreased concentration. The patient is not nervous/anxious.        Objective:     Today's Vitals   08/20/19 1030  BP: 136/70  Pulse: 72  Resp: 16  Temp: (!) 97.3 F (36.3 C)  Weight: 119 lb 12.8 oz (54.3 kg)  Height: 5\' 4"  (1.626 m)   Body mass index is 20.56 kg/m. Physical Exam Vitals reviewed.  Constitutional:      Appearance: Normal appearance. She is normal weight.  Cardiovascular:     Rate and Rhythm: Normal rate and regular rhythm.     Pulses: Normal pulses.     Heart sounds: Normal heart sounds.  Pulmonary:     Effort: Pulmonary effort is normal. No respiratory distress.     Breath sounds: Normal breath sounds.  Abdominal:     General: Abdomen is flat. Bowel sounds are normal.     Palpations: Abdomen is soft.     Tenderness: There is abdominal tenderness. There is rebound (Mid Epigastric).  Musculoskeletal:     Cervical back: Normal range of motion and neck supple.  Neurological:     Mental Status: She is alert and oriented to person, place, and time.  Psychiatric:        Mood and Affect: Mood normal.        Behavior: Behavior normal.     BP 136/70   Pulse 72   Temp (!) 97.3 F (36.3 C)   Resp 16   Ht 5\' 4"  (1.626 m)   Wt 119  lb 12.8 oz (54.3 kg)   BMI 20.56 kg/m  Wt Readings from Last 3 Encounters:  08/20/19 119 lb 12.8 oz (54.3 kg)  08/06/19 120 lb (54.4 kg)  07/25/19 114 lb 10.2 oz (52 kg)    Health Maintenance Due  Topic Date Due  . COVID-19 Vaccine (1) Never done  . PNA vac Low Risk Adult (1 of 2 - PCV13) Never done    Lab Results  Component Value  Date   TSH 0.441 (L) 09/23/2018   Lab Results  Component Value Date   WBC 9.0 08/06/2019   HGB 12.0 08/06/2019   HCT 36.0 08/06/2019   MCV 94 08/06/2019   PLT 235 08/06/2019   Lab Results  Component Value Date   NA 139 08/06/2019   K 4.4 08/06/2019   CO2 24 08/06/2019   GLUCOSE 91 08/06/2019   BUN 12 08/06/2019   CREATININE 0.94 08/06/2019   BILITOT 0.3 08/06/2019   ALKPHOS 74 08/06/2019   AST 13 08/06/2019   ALT 12 08/06/2019   PROT 6.7 08/06/2019   ALBUMIN 4.2 08/06/2019   CALCIUM 9.8 08/06/2019   ANIONGAP 6 07/27/2019   Lab Results  Component Value Date   CHOL 170 09/23/2018   Lab Results  Component Value Date   HDL 64 09/23/2018   Lab Results  Component Value Date   LDLCALC 69 09/23/2018   Lab Results  Component Value Date   TRIG 185 (H) 09/23/2018   Lab Results  Component Value Date   CHOLHDL 2.7 09/23/2018   Lab Results  Component Value Date   HGBA1C 5.8 (H) 07/10/2018       Assessment & Plan:  1. Acute cystitis with hematuria No symptoms currently-diagnosis ER with pt declining treatment -culture e-coli but pt feels contaminated with stool due to recent enema -blood noted today and culture pending. Pt does not want antibiotics unless culture positive - POCT urinalysis dipstick  2. Essential hypertension Pt using medication-intermittently and taking bp with low readings noted at home - TSH  3. Hoarseness of voice Greater than 1 year-d/w pt evaluation - Ambulatory referral to ENT  4. Medication management Multiple medication-daughter assists pt and will come with pt to appt to discuss best practice on taking medications - Amb Referral to Clinical Pharmacist  5. SOB (shortness of breath) Pt reports DOE and ongoing SOB with echo showing ER 60%-lung changes in CXR with NO HISTORY of tobacco use-no industrial exposure, partner did not smoke - Ambulatory referral to Pulmonology  6. Constipation, unspecified constipation type D/w pt DO NOT  TAKE PAIN MEDICATION. Water, fiber, miralax, stool softeners-epigastric pain-pantoprazole daily - Ambulatory referral to Gastroenterology  7. Chronic obstructive pulmonary disease, unspecified COPD type (HCC) Diagnosis on CXR with h/o SOB-no work up since failed PFT in office. Pt states she was not able to complete testing - Ambulatory referral to Pulmonology  8. Epigastric pain pantoprazole - Ambulatory referral to Gastroenterology  Reviewed hospitalizations x 2, labwork, medications, past history, exam and discussed recommendations Follow-up:  An After Visit Summary was printed and given to the patient.  Wandalee Ferdinandarolyn M Morrison [name] [phone]) [phone]

## 2019-08-21 ENCOUNTER — Other Ambulatory Visit: Payer: Self-pay | Admitting: Family Medicine

## 2019-08-21 DIAGNOSIS — I2510 Atherosclerotic heart disease of native coronary artery without angina pectoris: Secondary | ICD-10-CM

## 2019-08-21 LAB — TSH: TSH: 3.87 u[IU]/mL (ref 0.450–4.500)

## 2019-08-21 LAB — URINE CULTURE

## 2019-08-22 ENCOUNTER — Telehealth: Payer: Self-pay | Admitting: Family Medicine

## 2019-08-22 NOTE — Progress Notes (Signed)
  Chronic Care Management   Outreach Note  08/22/2019 Name: Leah Pittman MRN: [id number] DOB: October 15, 1930  Referred by: Blane Ohara, MD Reason for referral : No chief complaint on file.   An unsuccessful telephone outreach was attempted today. The patient was referred to the pharmacist for assistance with care management and care coordination. This note is not being shared with the patient for the following reason: To respect privacy (The patient or proxy has requested that the information not be shared).  Follow Up Plan:   Lynnae January Upstream Scheduler

## 2019-08-22 NOTE — Progress Notes (Signed)
  Chronic Care Management   Note  08/22/2019 Name: Leah Pittman MRN: [id number] DOB: [birth year]  Leah Pittman is a 86 y.o. year old female who is a primary care patient of [name], [name], MD. I reached out to [name] by phone today in response to a referral sent by Ms. Alben Spittle Limones's PCP, [name], [name], MD.   Ms. Jeune was given information about Chronic Care Management services today including:  1. CCM service includes personalized support from designated clinical staff supervised by her physician, including individualized plan of care and coordination with other care providers 2. 24/7 contact phone numbers for assistance for urgent and routine care needs. 3. Service will only be billed when office clinical staff spend 20 minutes or more in a month to coordinate care. 4. Only one practitioner may furnish and bill the service in a calendar month. 5. The patient may stop CCM services at any time (effective at the end of the month) by phone call to the office staff.   Patient agreed to services and verbal consent obtained.  This note is not being shared with the patient for the following reason: To respect privacy (The patient or proxy has requested that the information not be shared).  Follow up plan:   Lynnae January Upstream Scheduler

## 2019-09-19 ENCOUNTER — Other Ambulatory Visit: Payer: Self-pay | Admitting: Family Medicine

## 2019-09-25 ENCOUNTER — Telehealth: Payer: Self-pay | Admitting: Gastroenterology

## 2019-09-25 NOTE — Telephone Encounter (Signed)
Absolutely  no problems RG

## 2019-09-25 NOTE — Telephone Encounter (Signed)
Hey Dr Chales Abrahams, this pt states she has seen you in [place], pt was last seen by [facility] on 07/11/2017. Pt would like to know if you could take her back as a pt.  Records are in epic to review. Please advise on scheduling

## 2019-09-26 NOTE — Chronic Care Management (AMB) (Deleted)
 Chronic Care Management Pharmacy  Name: Leah Pittman  MRN: [id number] DOB: 08/17/1930  Chief Complaint/ HPI  Leah Pittman,  86 y.o. , female presents for their Initial CCM visit with the clinical pharmacist {CHL HP Upstream Pharm visit type:[id number]}.  PCP : Leah, [name], MD  Their chronic conditions include: hypertension, migraine headache, CAD, NSTEMI, COPD, anxiety, glucaoma, constipation, epigastric pain.   Office Visits:*** 08/20/2019 - f/u in office after ED visit for constipation. do not take pain medication due to constipation - water, fiber, miralax, stool softeners. Refer to GI. Refer to pulmonology to assess dyspnea on exertion/failed PFTs in office. Refer to ENT for hoarseness.  08/06/2019 - Start on Ranexa 500 mg bid for CAD. F/U with cardiology. Constipation stop lactulose and start amitiza 8 mg bid.  Consult Visit:*** 09/03/2019 - Opthamology - Latanoprost x1 OU. Dorzolamide-Timolol x2 OS. Artificial tears prn.  07/24/2019 - ED to hospital admission. Chest pain. Cardiac catherization performed. Prox RCA to Mid RCA lesion is 40% stenosed.  Ost LM to Mid LM lesion is 60% stenosed.  Mid Cx lesion is 99% stenosed 07/23/2019 - Opthamology Dr Bond - Start Latanoprost x1 OU. D/c Travoprost x1 OU qam. Dorzolamide-Timolol x2 OS 04/23/2019 - Opthamology Dr Bond - Travoprost x1 OU qam, Dorzolamide-timolol x2 OS. Artifical tears prn. Return in 3 months.  04/04/2019 - Cardiology - no recurrence of afib since immediate postop without treatment. Increase losartan 50 mg daily. BP elevated at appointment. Lipids at target goal.  Medications: Outpatient Encounter Medications as of 09/29/2019  Medication Sig  . acetaminophen (TYLENOL) 325 MG tablet Take 975 mg by mouth every 8 (eight) hours as needed for mild pain or headache.   . aspirin 81 MG tablet Take 81 mg by mouth daily.  . Calcium Citrate-Vitamin D (CALCIUM CITRATE+D3 PETITES PO) Take 1 tablet by mouth daily.  . Docusate  Sodium (STOOL SOFTENER LAXATIVE PO) Take by mouth.  . dorzolamide-timolol (COSOPT) 22.3-6.8 MG/ML ophthalmic solution Place 1 drop into the left eye 2 (two) times daily.   . HYDROcodone-acetaminophen (NORCO/VICODIN) 5-325 MG tablet Take 1 tablet by mouth every 6 (six) hours as needed for moderate pain.  . latanoprost (XALATAN) 0.005 % ophthalmic solution Place 1 drop into the left eye at bedtime.  . levothyroxine (SYNTHROID) 100 MCG tablet TAKE 1 TABLET BY MOUTH EVERY DAY (Patient taking differently: Take 100 mcg by mouth daily. )  . LORazepam (ATIVAN) 1 MG tablet TAKE 1 TABLET(1 MG) BY MOUTH TWICE DAILY AS NEEDED FOR ANXIETY  . losartan (COZAAR) 25 MG tablet TAKE 1 TABLET(25 MG) BY MOUTH 1 TIME PER DAY (Patient taking differently: Take 25 mg by mouth daily. )  . magnesium oxide (MAG-OX) 400 MG tablet Take 400 mg by mouth daily.  . nitroGLYCERIN (NITROSTAT) 0.4 MG SL tablet Place 0.4 mg under the tongue every 5 (five) minutes as needed for chest pain.   . ondansetron (ZOFRAN) 8 MG tablet Take 1 tablet (8 mg total) by mouth 2 (two) times daily as needed for nausea or vomiting.  . pantoprazole (PROTONIX) 40 MG tablet Take 1 tablet (40 mg total) by mouth daily.  . Probiotic Product (PROBIOTIC DAILY PO) Take 1 capsule by mouth daily.  . ranolazine (RANEXA) 500 MG 12 hr tablet TAKE 1 TABLET BY MOUTH TWICE A DAY  . simvastatin (ZOCOR) 20 MG tablet TAKE 1 TABLET BY MOUTH EVERYDAY AT BEDTIME  . Vitamin D, Cholecalciferol, 50 MCG (2000 UT) CAPS Take 1 tablet by mouth daily.   No   facility-administered encounter medications on file as of 09/29/2019.   Allergies  Allergen Reactions  . Benadryl [Diphenhydramine] Swelling and Other (See Comments)    Tongue swells and hallucinates- could not talk  . Codeine Nausea And Vomiting and Hypertension  . Meperidine Nausea Only, Swelling and Other (See Comments)    Tongue swells and "I feel like death"  . Prednisone     Hypertension   . Promethazine Other (See  Comments)    Hypotension   . Propranolol Nausea And Vomiting  . Shellfish Allergy Nausea And Vomiting  . Tape Other (See Comments)    Tears the skin  . Tazarotene Other (See Comments)    Reaction not recalled   . Iodinated Diagnostic Agents Nausea Only and Rash   SDOH Screenings   Alcohol Screen:   . Last Alcohol Screening Score (AUDIT):   Depression (PHQ2-9):   . PHQ-2 Score:   Financial Resource Strain:   . Difficulty of Paying Living Expenses:   Food Insecurity:   . Worried About Running Out of Food in the Last Year:   . Ran Out of Food in the Last Year:   Housing:   . Last Housing Risk Score:   Physical Activity:   . Days of Exercise per Week:   . Minutes of Exercise per Session:   Social Connections:   . Frequency of Communication with Friends and Family:   . Frequency of Social Gatherings with Friends and Family:   . Attends Religious Services:   . Active Member of Clubs or Organizations:   . Attends Club or Organization Meetings:   . Marital Status:   Stress:   . Feeling of Stress :   Tobacco Use: Low Risk   . Smoking Tobacco Use: Never Smoker  . Smokeless Tobacco Use: Never Used  Transportation Needs:   . Lack of Transportation (Medical):   . Lack of Transportation (Non-Medical):      Current Diagnosis/Assessment:  Goals Addressed   None     COPD / Asthma / Tobacco   Last spirometry score: ***  Gold Grade: {CHL [facility] Upstream Pharm COPD Gold Grade:2109141008} Current COPD Classification:  {[facility]L [facility] Upstream Pharm COPD Classification:2109141009}  Eosinophil count:   Lab Results  Component Value Date/Time   EOSPCT 6 07/17/2018 07:59 AM  %                               Eos (Absolute):  Lab Results  Component Value Date/Time   EOSABS 0.5 (H) 08/06/2019 03:24 PM    Tobacco Status:  Social History   Tobacco Use  Smoking Status Never Smoker  Smokeless Tobacco Never Used    Patient has failed these meds in past: *** Patient is currently {CHL  Controlled/Uncontrolled:2109141014} on the following medications: *** Using maintenance inhaler regularly? {yes/no:20286} Frequency of rescue inhaler use:  {[facility] Pharm Inhaler Freq:2109141010}  We discussed:  {[facility] Pharmacy discussion:2109141007}  Plan  Continue {[facility] Pharmacy Plans:2109141003}  and  Hypertension   BP today is:  {CHL HP UPSTREAM Pharmacist BP ranges:2109141006}  Office blood pressures are  BP Readings from Last 3 Encounters:  08/20/19 136/70  08/06/19 130/78  07/27/19 (!) 121/36    Patient has failed these meds in the past: lisinopril, bystolic, clonidine Patient is currently controlled/uncontrolled*** on the following medications: ***  Losartan 25 mg daily  Patient checks BP at home {CHL HP BP Monitoring Frequency:2109141004}  Patient home   BP readings are ranging: ***  We discussed {[facility] Pharmacy discussion:2109141007}  Plan  Continue {[facility] Up[facility] Pharmacy Plans:2109141003}   Hypothyroidism   Lab Results  Component Value Date/Time   TSH 3.870 08/20/2019 12:02 PM   TSH 0.441 (L) 09/23/2018 02:12 PM    Patient has failed these meds in past: pravastatin Patient is currently {CHL Controlled/Uncontrolled:2109141014} on the following medications:  . Levothyroxine 100 mcg daily  We discussed:  {[facility] Up[facility] Pharmacy discussion:2109141007}  Plan  Continue {[facility] Up[facility] Pharmacy Plans:2109141003}   CAD with History of NSTEMI   LDL goal < ***  Lipid Panel     Component Value Date/Time   CHOL 170 09/23/2018 1412   TRIG 185 (H) 09/23/2018 1412   HDL 64 09/23/2018 1412   LDLCALC 69 09/23/2018 1412    Hepatic Function Latest Ref Rng & Units 08/06/2019 07/15/2018 05/10/2018  Total Protein 6.0 - 8.5 g/dL 6.7 4.8(L) 7.2  Albumin 3.6 - 4.6 g/dL 4.2 2.7(L) 4.2  AST 0 - 40 IU/L 13 27 24  ALT 0 - 32 IU/L 12 54(H) 26  Alk Phosphatase 48 - 121 IU/L 74 48 64  Total Bilirubin 0.0 - 1.2 mg/dL 0.3 0.9  0.6     The ASCVD Risk score (Goff DC Jr., et al., 2013) failed to calculate for the following reasons:   The 2013 ASCVD risk score is only valid for ages 40 to 79   The patient has a prior MI or stroke diagnosis   Patient has failed these meds in past: *** Patient is currently {CHL Controlled/Uncontrolled:2109141014} on the following medications:  . Aspirin 81 mg daily . Nitroglycerin 0.4 mg prn chest pain . Ranolazine 500 mg bid . Simvastatin 20 mg daily at bedtime  We discussed:  {[facility] Pharmacy discussion:2109141007}  Plan  Continue {[facility] Pharmacy Plans:2109141003}   Constipation   Patient has failed these meds in past: senna, bisacodyl Patient is currently {CHL Controlled/Uncontrolled:2109141014} on the following medications:  . Docusate ***  We discussed:  ***  Plan  Continue {[facility] Pharmacy Plans:2109141003}  ***Epigastric Pain   Patient has failed these meds in past: maalox, dicyclomine, omeprazole Patient is currently {CHL Controlled/Uncontrolled:2109141014} on the following medications:  . Pantoprazole 40 mg daily  We discussed:  ***  Plan  Continue {[facility] Pharmacy Plans:2109141003}   Anxiety   Patient has failed these meds in past: hydroxyzine, amitrtiptyline Patient is currently {CHL Controlled/Uncontrolled:2109141014} on the following medications:  . Lorazepam 1 mg bid prn anxiety  We discussed:  ***  Plan  Continue {[facility] Pharmacy Plans:2109141003}   ***Pain***???   Patient has failed these meds in past: oxycodone Patient is currently {CHL Controlled/Uncontrolled:2109141014} on the following medications:  . Acetaminophen 975 mg every 8 hours prn pain or headache . Hydrocodone-apap 5-325 mg every 6 hours prn moderate pain  We discussed:  ***  Plan  Continue {[facility] Pharmacy Plans:2109141003}  ***Glaucoma   Patient has failed these meds in past: travopost, timolol Patient is  currently {CHL Controlled/Uncontrolled:2109141014} on the following medications:  . Dorzolamide-timolol eye drops 1 drop into the left eye 2 times daily . Latanoprost 0.005% left eye at bedtime  We discussed:  ***  Plan  Continue {[facility] Pharmacy Plans:2109141003}   Osteopenia / Osteoporosis   Last DEXA Scan: ***   T-Score femoral neck: ***  T-Score total hip: ***  T-Score lumbar spine: ***  T-Score forearm radius: ***  10-year   probability of major osteoporotic fracture: ***  10-year probability of hip fracture: ***  No results found for: VD25OH   Patient {is;is not an osteoporosis candidate:23886}  Patient has failed these meds in past: alendronate Patient is currently {CHL Controlled/Uncontrolled:2109141014} on the following medications:  . Calcium citrate with vitamin d daily  . Vitamin d 2000 units daily   We discussed:  {Osteoporosis Counseling:23892}  Plan  Continue {[facility] Pharmacy Plans:2109141003}  Health Maintenance   Patient is currently {CHL Controlled/Uncontrolled:2109141014} on the following medications:  . Probiotic daily - ***  We discussed:  ***  Plan  Continue {[facility] Upstream Pharmacy Plans:2109141003}  Vaccines   Reviewed and discussed patient's vaccination history.     There is no immunization history on file for this patient.  Plan  Recommended patient receive *** vaccine in *** office.   Medication Management   [facility] Uses pill box? {Yes or If no, why not?:20788} Pt endorses ***% compliance  We discussed: ***  Plan  {US Pharmacy Plan:23885}    Follow up: *** month phone visit  ***     

## 2019-09-26 NOTE — Progress Notes (Deleted)
Chronic Care Management Pharmacy  Name: Leah Pittman  MRN: [id number] DOB: 01-Jul-1930  Chief Complaint/ HPI  Leah Pittman,  86 y.o. , female presents for their Initial CCM visit with the clinical pharmacist {[facility] Upstream Pharm visit AJOI:[id number]}.  PCP : Leah Brome, MD  Their chronic conditions include: hypertension, migraine headache, CAD, NSTEMI, COPD, anxiety, glucaoma, constipation, epigastric pain.   Office Visits:***  Consult Visit:***  Medications: Outpatient Encounter Medications as of 09/29/2019  Medication Sig  . acetaminophen (TYLENOL) 325 MG tablet Take 975 mg by mouth every 8 (eight) hours as needed for mild pain or headache.   Marland Kitchen aspirin 81 MG tablet Take 81 mg by mouth daily.  . Calcium Citrate-Vitamin D (CALCIUM CITRATE+D3 PETITES PO) Take 1 tablet by mouth daily.  [name] Sodium (STOOL SOFTENER LAXATIVE PO) Take by mouth.  . dorzolamide-timolol (COSOPT) 22.3-6.8 MG/ML ophthalmic solution Place 1 drop into the left eye 2 (two) times daily.   Marland Kitchen HYDROcodone-acetaminophen (NORCO/VICODIN) 5-325 MG tablet Take 1 tablet by mouth every 6 (six) hours as needed for moderate pain.  Marland Kitchen latanoprost (XALATAN) 0.005 % ophthalmic solution Place 1 drop into the left eye at bedtime.  Marland Kitchen levothyroxine (SYNTHROID) 100 MCG tablet TAKE 1 TABLET BY MOUTH EVERY DAY (Patient taking differently: Take 100 mcg by mouth daily. )  . LORazepam (ATIVAN) 1 MG tablet TAKE 1 TABLET(1 MG) BY MOUTH TWICE DAILY AS NEEDED FOR ANXIETY  . losartan (COZAAR) 25 MG tablet TAKE 1 TABLET(25 MG) BY MOUTH 1 TIME PER DAY (Patient taking differently: Take 25 mg by mouth daily. )  . magnesium oxide (MAG-OX) 400 MG tablet Take 400 mg by mouth daily.  . nitroGLYCERIN (NITROSTAT) 0.4 MG SL tablet Place 0.4 mg under the tongue every 5 (five) minutes as needed for chest pain.   Marland Kitchen ondansetron (ZOFRAN) 8 MG tablet Take 1 tablet (8 mg total) by mouth 2 (two) times daily as needed for nausea or vomiting.  .  pantoprazole (PROTONIX) 40 MG tablet Take 1 tablet (40 mg total) by mouth daily.  . Probiotic Product (PROBIOTIC DAILY PO) Take 1 capsule by mouth daily.  . ranolazine (RANEXA) 500 MG 12 hr tablet TAKE 1 TABLET BY MOUTH TWICE A DAY  . simvastatin (ZOCOR) 20 MG tablet TAKE 1 TABLET BY MOUTH EVERYDAY AT BEDTIME  . Vitamin D, Cholecalciferol, 50 MCG (2000 UT) CAPS Take 1 tablet by mouth daily.   No facility-administered encounter medications on file as of 09/29/2019.   Allergies  Allergen Reactions  . Benadryl [Diphenhydramine] Swelling and Other (See Comments)    Tongue swells and hallucinates- could not talk  . Codeine Nausea And Vomiting and Hypertension  . Meperidine Nausea Only, Swelling and Other (See Comments)    Tongue swells and "I feel like death"  . Prednisone     Hypertension   . Promethazine Other (See Comments)    Hypotension   . Propranolol Nausea And Vomiting  . Shellfish Allergy Nausea And Vomiting  . Tape Other (See Comments)    Tears the skin  . Tazarotene Other (See Comments)    Reaction not recalled   . Iodinated Diagnostic Agents Nausea Only and Rash   SDOH Screenings   Alcohol Screen:   . Last Alcohol Screening Score (AUDIT):   Depression (PHQ2-9):   . PHQ-2 Score:   Financial Resource Strain:   . Difficulty of Paying Living Expenses:   Food Insecurity:   . Worried About Charity fundraiser in the Last  Year:   . Ran Out of Food in the Last Year:   Housing:   . Last Housing Risk Score:   Physical Activity:   . Days of Exercise per Week:   . Minutes of Exercise per Session:   Social Connections:   . Frequency of Communication with Friends and Family:   . Frequency of Social Gatherings with Friends and Family:   . Attends Religious Services:   . Active Member of Clubs or Organizations:   . Attends Archivist Meetings:   Marland Kitchen Marital Status:   Stress:   . Feeling of Stress :   Tobacco Use: Low Risk   . Smoking Tobacco Use: Never Smoker  .  Smokeless Tobacco Use: Never Used  Transportation Needs:   . Film/video editor (Medical):   Marland Kitchen Lack of Transportation (Non-Medical):      Current Diagnosis/Assessment:  Goals Addressed   None     COPD / Asthma / Tobacco   Last spirometry score: ***  Gold Grade: {CHL HP Upstream Pharm COPD Gold ZRAQT:[id number]} Current COPD Classification:  {[facility]L [facility] Pharm COPD [phone]}  Eosinophil count:   Lab Results  Component Value Date/Time   EOSPCT 6 07/17/2018 07:59 AM  %                               Eos (Absolute):  Lab Results  Component Value Date/Time   EOSABS 0.5 (H) 08/06/2019 03:24 PM    Tobacco Status:  Social History   Tobacco Use  Smoking Status Never Smoker  Smokeless Tobacco Never Used    Patient has failed these meds in past: *** Patient is currently {CHL Controlled/[phone]} on the following medications: *** Using maintenance inhaler regularly? {yes/no:20286} Frequency of rescue inhaler use:  {[facility]L [facility]stream Pharm Inhaler GYBW:[id number]}  We discussed:  {[facility] Pharmacy discussion:[phone]}  Plan  Continue {[facility] Pharmacy Plans:[phone]}  and  Hypertension   BP today is:  {CHL HP UPSTREAM Pharmacist BP ranges:[phone]}  Office blood pressures are  BP Readings from Last 3 Encounters:  08/20/19 136/70  08/06/19 130/78  07/27/19 (!) 121/36    Patient has failed these meds in the past: lisinopril, bystolic, clonidine Patient is currently controlled/uncontrolled*** on the following medications: ***  Losartan 25 mg daily  Patient checks BP at home {CHL HP BP Monitoring [phone]}  Patient home BP readings are ranging: ***  We discussed {[facility]stream Pharmacy discussion:[phone]}  Plan  Continue {CHL HP Upstream Pharmacy Plans:[phone]}   Hypothyroidism   Lab Results  Component Value Date/Time   TSH 3.870 08/20/2019 12:02 PM   TSH 0.441 (L) 09/23/2018  02:12 PM    Patient has failed these meds in past: pravastatin Patient is currently {CHL Controlled/[phone]} on the following medications:  . Levothyroxine 100 mcg daily  We discussed:  {[facility] Pharmacy discussion:[phone]}  Plan  Continue {[facility]stream Pharmacy Plans:[phone]}   CAD with History of NSTEMI   LDL goal < ***  Lipid Panel     Component Value Date/Time   CHOL 170 09/23/2018 1412   TRIG 185 (H) 09/23/2018 1412   HDL 64 09/23/2018 1412   LDLCALC 69 09/23/2018 1412    Hepatic Function Latest Ref Rng & Units 08/06/2019 07/15/2018 05/10/2018  Total Protein 6.0 - 8.5 g/dL 6.7 4.8(L) 7.2  Albumin 3.6 - 4.6 g/dL 4.2 2.7(L) 4.2  AST 0 - 40 IU/L 13 27 24  ALT 0 - 32 IU/L 12 54(H) 26  Alk Phosphatase 48 - 121 IU/L 74 48 64  Total Bilirubin 0.0 - 1.2 mg/dL 0.3 0.9 0.6     The ASCVD Risk score (Luverne., et al., 2013) failed to calculate for the following reasons:   The 2013 ASCVD risk score is only valid for ages 35 to 74   The patient has a prior MI or stroke diagnosis   Patient has failed these meds in past: *** Patient is currently {CHL Controlled/[phone]} on the following medications:  . Aspirin 81 mg daily . Nitroglycerin 0.4 mg prn chest pain . Ranolazine 500 mg bid . Simvastatin 20 mg daily at bedtime  We discussed:  {[facility] Pharmacy discussion:[phone]}  Plan  Continue {[facility] Pharmacy [phone]}   Constipation   Patient has failed these meds in past: senna, bisacodyl Patient is currently {CHL Controlled/[phone]} on the following medications:  . Docusate ***  We discussed:  ***  Plan  Continue {CHL [facility]stream Pharmacy [phone]}  ***Epigastric Pain   Patient has failed these meds in past: maalox, dicyclomine, omeprazole Patient is currently {CHL Controlled/[phone]} on the following medications:  . Pantoprazole 40 mg daily  We  discussed:  ***  Plan  Continue {[facility]stream Pharmacy SNKNL:[id number]}   Anxiety   Patient has failed these meds in past: hydroxyzine, amitrtiptyline Patient is currently {CHL Controlled/[phone]} on the following medications:  . Lorazepam 1 mg bid prn anxiety  We discussed:  ***  Plan  Continue {CHL HP Upstream Pharmacy [phone]}   ***Pain***???   Patient has failed these meds in past: oxycodone Patient is currently {CHL Controlled/[phone]} on the following medications:  . Acetaminophen 975 mg every 8 hours prn pain or headache . Hydrocodone-apap 5-325 mg every 6 hours prn moderate pain  We discussed:  ***  Plan  Continue {CHL HP Upstream Pharmacy [phone]}  ***Glaucoma   Patient has failed these meds in past: travopost, timolol Patient is currently {CHL Controlled/[phone]} on the following medications:  Marland Kitchen Dorzolamide-timolol eye drops 1 drop into the left eye 2 times daily . Latanoprost 0.005% left eye at bedtime  We discussed:  ***  Plan  Continue {CHL HP Upstream Pharmacy [phone]}   Osteopenia / Osteoporosis   Last DEXA Scan: ***   T-Score femoral neck: ***  T-Score total hip: ***  T-Score lumbar spine: ***  T-Score forearm radius: ***  10-year probability of major osteoporotic fracture: ***  10-year probability of hip fracture: ***  No results found for: VD25OH   Patient {is;is not an osteoporosis candidate:23886}  Patient has failed these meds in past: alendronate Patient is currently {CHL Controlled/[phone]} on the following medications:  . Calcium citrate with vitamin d daily  . Vitamin d 2000 units daily   We discussed:  {Osteoporosis Counseling:23892}  Plan  Continue {CHL HP Upstream Pharmacy [phone]}  Health Maintenance   Patient is currently {CHL Controlled/[phone]} on the following medications:  . Probiotic daily -  ***  We discussed:  ***  Plan  Continue {CHL HP Upstream Pharmacy TKWIO:[id number]}  Vaccines   Reviewed and discussed patient's vaccination history.     There is no immunization history on file for this patient.  Plan  Recommended patient receive *** vaccine in *** office.   Medication Management   [facility] Uses pill box? {Yes or If no, why not?:20788} Pt endorses ***% compliance  We discussed: ***  Plan  {[facility] Pharmacy QAST:41962}  Follow up: *** month phone visit  ***

## 2019-09-29 ENCOUNTER — Telehealth: Payer: Medicare Other

## 2019-09-30 ENCOUNTER — Telehealth: Payer: Self-pay | Admitting: Family Medicine

## 2019-09-30 NOTE — Progress Notes (Signed)
  Chronic Care Management   Outreach Note  09/30/2019 Name: Leah Pittman MRN: [id number] DOB: 19-Sep-1930  Referred by: Blane Ohara, MD Reason for referral : No chief complaint on file.   An unsuccessful telephone outreach was attempted today. The patient was referred to the pharmacist for assistance with care management and care coordination.   Follow Up Plan:   Lynnae January Upstream Scheduler

## 2019-10-08 ENCOUNTER — Other Ambulatory Visit: Payer: Self-pay | Admitting: Physician Assistant

## 2019-10-13 ENCOUNTER — Institutional Professional Consult (permissible substitution): Payer: Medicare Other | Admitting: Emergency Medicine

## 2019-10-29 NOTE — Chronic Care Management (AMB) (Deleted)
Chronic Care Management Pharmacy  Name: Leah Pittman  MRN: [id number] DOB: 1930/06/22  Chief Complaint/ HPI  Leah Pittman,  86 y.o. , female presents for their Initial CCM visit with the clinical pharmacist {CHL HP Upstream Pharm visit IFOY:[id number]}.  PCP : Leah Brome, MD  Their chronic conditions include: hypertension, migraine headache, CAD, NSTEMI, COPD, anxiety, glucaoma, constipation, epigastric pain.   Office Visits:*** 08/20/2019 - f/u in office after ED visit for constipation. do not take pain medication due to constipation - water, fiber, miralax, stool softeners. Refer to GI. Refer to pulmonology to assess dyspnea on exertion/failed PFTs in office. Refer to ENT for hoarseness.  08/06/2019 - Start on Ranexa 500 mg bid for CAD. F/U with cardiology. Constipation stop lactulose and start amitiza 8 mg bid.  Consult Visit:*** 09/03/2019 - Opthamology - Latanoprost x1 OU. Dorzolamide-Timolol x2 OS. Artificial tears prn.  07/24/2019 - ED to hospital admission. Chest pain. Cardiac catherization performed. Prox RCA to Mid RCA lesion is 40% stenosed.  Ost LM to Mid LM lesion is 60% stenosed.  Mid Cx lesion is 99% stenosed 07/23/2019 - Opthamology Dr Edilia Bo - Start Latanoprost x1 OU. D/c Travoprost x1 OU qam. Dorzolamide-Timolol x2 OS 04/23/2019 - Opthamology Dr Edilia Bo - Travoprost x1 OU qam, Dorzolamide-timolol x2 OS. Artifical tears prn. Return in 3 months.  04/04/2019 - Cardiology - no recurrence of afib since immediate postop without treatment. Increase losartan 50 mg daily. BP elevated at appointment. Lipids at target goal.  Medications: Outpatient Encounter Medications as of 09/29/2019  Medication Sig  . acetaminophen (TYLENOL) 325 MG tablet Take 975 mg by mouth every 8 (eight) hours as needed for mild pain or headache.   Marland Kitchen aspirin 81 MG tablet Take 81 mg by mouth daily.  . Calcium Citrate-Vitamin D (CALCIUM CITRATE+D3 PETITES PO) Take 1 tablet by mouth daily.  Leah Pittman  Sodium (STOOL SOFTENER LAXATIVE PO) Take by mouth.  . dorzolamide-timolol (COSOPT) 22.3-6.8 MG/ML ophthalmic solution Place 1 drop into the left eye 2 (two) times daily.   Marland Kitchen HYDROcodone-acetaminophen (NORCO/VICODIN) 5-325 MG tablet Take 1 tablet by mouth every 6 (six) hours as needed for moderate pain.  Marland Kitchen latanoprost (XALATAN) 0.005 % ophthalmic solution Place 1 drop into the left eye at bedtime.  Marland Kitchen levothyroxine (SYNTHROID) 100 MCG tablet TAKE 1 TABLET BY MOUTH EVERY DAY (Patient taking differently: Take 100 mcg by mouth daily. )  . LORazepam (ATIVAN) 1 MG tablet TAKE 1 TABLET(1 MG) BY MOUTH TWICE DAILY AS NEEDED FOR ANXIETY  . losartan (COZAAR) 25 MG tablet TAKE 1 TABLET(25 MG) BY MOUTH 1 TIME PER DAY (Patient taking differently: Take 25 mg by mouth daily. )  . magnesium oxide (MAG-OX) 400 MG tablet Take 400 mg by mouth daily.  . nitroGLYCERIN (NITROSTAT) 0.4 MG SL tablet Place 0.4 mg under the tongue every 5 (five) minutes as needed for chest pain.   Marland Kitchen ondansetron (ZOFRAN) 8 MG tablet Take 1 tablet (8 mg total) by mouth 2 (two) times daily as needed for nausea or vomiting.  . pantoprazole (PROTONIX) 40 MG tablet Take 1 tablet (40 mg total) by mouth daily.  . Probiotic Product (PROBIOTIC DAILY PO) Take 1 capsule by mouth daily.  . ranolazine (RANEXA) 500 MG 12 hr tablet TAKE 1 TABLET BY MOUTH TWICE A DAY  . simvastatin (ZOCOR) 20 MG tablet TAKE 1 TABLET BY MOUTH EVERYDAY AT BEDTIME  . Vitamin D, Cholecalciferol, 50 MCG (2000 UT) CAPS Take 1 tablet by mouth daily.   No  facility-administered encounter medications on file as of 09/29/2019.   Allergies  Allergen Reactions  . Benadryl [Diphenhydramine] Swelling and Other (See Comments)    Tongue swells and hallucinates- could not talk  . Codeine Nausea And Vomiting and Hypertension  . Meperidine Nausea Only, Swelling and Other (See Comments)    Tongue swells and "I feel like death"  . Prednisone     Hypertension   . Promethazine Other (See  Comments)    Hypotension   . Propranolol Nausea And Vomiting  . Shellfish Allergy Nausea And Vomiting  . Tape Other (See Comments)    Tears the skin  . Tazarotene Other (See Comments)    Reaction not recalled   . Iodinated Diagnostic Agents Nausea Only and Rash   SDOH Screenings   Alcohol Screen:   . Last Alcohol Screening Score (AUDIT):   Depression (PHQ2-9):   . PHQ-2 Score:   Financial Resource Strain:   . Difficulty of Paying Living Expenses:   Food Insecurity:   . Worried About Charity fundraiser in the Last Year:   . [place] in the Last Year:   Housing:   . Last Housing Risk Score:   Physical Activity:   . Days of Exercise per Week:   . Minutes of Exercise per Session:   Social Connections:   . Frequency of Communication with Friends and Family:   . Frequency of Social Gatherings with Friends and Family:   . Attends Religious Services:   . Active Member of Clubs or Organizations:   . Attends Archivist Meetings:   Marland Kitchen Marital Status:   Stress:   . Feeling of Stress :   Tobacco Use: Low Risk   . Smoking Tobacco Use: Never Smoker  . Smokeless Tobacco Use: Never Used  Transportation Needs:   . Film/video editor (Medical):   Marland Kitchen Lack of Transportation (Non-Medical):      Current Diagnosis/Assessment:  Goals Addressed   None     COPD / Asthma / Tobacco   Last spirometry score: ***  Gold Grade: {[facility]L [facility] Pharm COPD Gold BHALP:[id number]} Current COPD Classification:  {[facility]L [facility]stream Pharm COPD [phone]}  Eosinophil count:   Lab Results  Component Value Date/Time   EOSPCT 6 07/17/2018 07:59 AM  %                               Eos (Absolute):  Lab Results  Component Value Date/Time   EOSABS 0.5 (H) 08/06/2019 03:24 PM    Tobacco Status:  Social History   Tobacco Use  Smoking Status Never Smoker  Smokeless Tobacco Never Used    Patient has failed these meds in past: *** Patient is currently {CHL  Controlled/Uncontrolled:4582255510} on the following medications: *** Using maintenance inhaler regularly? {yes/no:20286} Frequency of rescue inhaler use:  {CHL [facility] Upstream Pharm Inhaler ZHGD:[id number]}  We discussed:  {[facility] Pharmacy [phone]}  Plan  Continue {[facility] Pharmacy Plans:[phone]}  and  Hypertension   BP today is:  {CHL HP UPSTREAM Pharmacist BP ranges:[phone]}  Office blood pressures are  BP Readings from Last 3 Encounters:  08/20/19 136/70  08/06/19 130/78  07/27/19 (!) 121/36    Patient has failed these meds in the past: lisinopril, bystolic, clonidine Patient is currently controlled/uncontrolled*** on the following medications: ***  Losartan 25 mg daily  Patient checks BP at home {CHL HP BP Monitoring [phone]}  Patient home  BP readings are ranging: ***  We discussed {[facility] Pharmacy [phone]}  Plan  Continue {[facility]L [facility] Upstream Pharmacy Plans:864 150 1440}   Hypothyroidism   Lab Results  Component Value Date/Time   TSH 3.870 08/20/2019 12:02 PM   TSH 0.441 (L) 09/23/2018 02:12 PM    Patient has failed these meds in past: pravastatin Patient is currently {CHL Controlled/Uncontrolled:[phone]} on the following medications:  . Levothyroxine 100 mcg daily  We discussed:  {[facility] Pharmacy [phone]}  Plan  Continue {CHL [facility] Upstream Pharmacy Plans:864 150 1440}   CAD with History of NSTEMI   LDL goal < ***  Lipid Panel     Component Value Date/Time   CHOL 170 09/23/2018 1412   TRIG 185 (H) 09/23/2018 1412   HDL 64 09/23/2018 1412   LDLCALC 69 09/23/2018 1412    Hepatic Function Latest Ref Rng & Units 08/06/2019 07/15/2018 05/10/2018  Total Protein 6.0 - 8.5 g/dL 6.7 4.8(L) 7.2  Albumin 3.6 - 4.6 g/dL 4.2 2.7(L) 4.2  AST 0 - 40 IU/L _0 ALT 0 - 32 IU/L 12 54(H) 26  Alk Phosphatase 48 - 121 IU/L 74 48 64  Total Bilirubin 0.0 - 1.2 mg/dL 0.3 0.9  0.6     The ASCVD Risk score ([place]., et al., 2013) failed to calculate for the following reasons:   The 2013 ASCVD risk score is only valid for ages 5 to 79   The patient has a prior MI or stroke diagnosis   Patient has failed these meds in past: *** Patient is currently {CHL Controlled/Uncontrolled:[phone]} on the following medications:  . Aspirin 81 mg daily . Nitroglycerin 0.4 mg prn chest pain . Ranolazine 500 mg bid . Simvastatin 20 mg daily at bedtime  We discussed:  {[facility] Pharmacy [phone]}  Plan  Continue {[facility]L [facility] Upstream Pharmacy Plans:864 150 1440}   Constipation   Patient has failed these meds in past: senna, bisacodyl Patient is currently {CHL Controlled/Uncontrolled:[phone]} on the following medications:  . Docusate ***  We discussed:  ***  Plan  Continue {CHL [facility] Upstream Pharmacy Plans:864 150 1440}  ***Epigastric Pain   Patient has failed these meds in past: maalox, dicyclomine, omeprazole Patient is currently {CHL Controlled/Uncontrolled:[phone]} on the following medications:  . Pantoprazole 40 mg daily  We discussed:  ***  Plan  Continue {[facility] Upstream Pharmacy YJEHU:[id number]}   Anxiety   Patient has failed these meds in past: hydroxyzine, amitrtiptyline Patient is currently {CHL Controlled/Uncontrolled:[phone]} on the following medications:  . Lorazepam 1 mg bid prn anxiety  We discussed:  ***  Plan  Continue {[facility] Upstream Pharmacy Plans:864 150 1440}   ***Pain***???   Patient has failed these meds in past: oxycodone Patient is currently {CHL Controlled/Uncontrolled:[phone]} on the following medications:  . Acetaminophen 975 mg every 8 hours prn pain or headache . Hydrocodone-apap 5-325 mg every 6 hours prn moderate pain  We discussed:  ***  Plan  Continue {CHL [facility] Upstream Pharmacy Plans:864 150 1440}  ***Glaucoma   Patient has failed these meds in past: travopost, timolol Patient is  currently {CHL Controlled/Uncontrolled:[phone]} on the following medications:  Marland Kitchen Dorzolamide-timolol eye drops 1 drop into the left eye 2 times daily . Latanoprost 0.005% left eye at bedtime  We discussed:  ***  Plan  Continue {[facility]L [facility] Upstream Pharmacy Plans:864 150 1440}   Osteopenia / Osteoporosis   Last DEXA Scan: ***   T-Score femoral neck: ***  T-Score total hip: ***  T-Score lumbar spine: ***  T-Score forearm radius: ***  10-year  probability of major osteoporotic fracture: ***  10-year probability of hip fracture: ***  No results found for: VD25OH   Patient {is;is not an osteoporosis candidate:23886}  Patient has failed these meds in past: alendronate Patient is currently {CHL Controlled/Uncontrolled:[id number]} on the following medications:  . Calcium citrate with vitamin d daily  . Vitamin d 2000 units daily   We discussed:  {Osteoporosis Counseling:23892}  Plan  Continue {[facility] Upstream Pharmacy Plans:[phone]}  Health Maintenance   Patient is currently {CHL Controlled/Uncontrolled:[id number]} on the following medications:  . Probiotic daily - ***  We discussed:  ***  Plan  Continue {[facility] Pharmacy MOLMB:[id number]}  Vaccines   Reviewed and discussed patient's vaccination history.     There is no immunization history on file for this patient.  Plan  Recommended patient receive *** vaccine in *** office.   Medication Management   Pt uses [facility] for all medications Uses pill box? {Yes or If no, why not?:20788} Pt endorses ***% compliance  We discussed: ***  Plan  {[facility] EOFH:21975}    Follow up: *** month phone visit  ***

## 2019-11-03 ENCOUNTER — Telehealth: Payer: Self-pay | Admitting: Cardiovascular Disease

## 2019-11-03 ENCOUNTER — Emergency Department (HOSPITAL_COMMUNITY): Payer: Medicare Other

## 2019-11-03 ENCOUNTER — Inpatient Hospital Stay (HOSPITAL_COMMUNITY)
Admission: EM | Admit: 2019-11-03 | Discharge: 2019-11-08 | DRG: 304 | Disposition: A | Discharge status: Home / Self Care | Payer: Medicare Other | Attending: Internal Medicine | Admitting: Internal Medicine

## 2019-11-03 ENCOUNTER — Encounter (HOSPITAL_COMMUNITY): Payer: Self-pay

## 2019-11-03 ENCOUNTER — Other Ambulatory Visit: Payer: Self-pay

## 2019-11-03 DIAGNOSIS — Z20822 Contact with and (suspected) exposure to covid-19: Secondary | ICD-10-CM | POA: Diagnosis present

## 2019-11-03 DIAGNOSIS — M1990 Unspecified osteoarthritis, unspecified site: Secondary | ICD-10-CM | POA: Diagnosis present

## 2019-11-03 DIAGNOSIS — Z951 Presence of aortocoronary bypass graft: Secondary | ICD-10-CM

## 2019-11-03 DIAGNOSIS — I7101 Dissection of thoracic aorta: Secondary | ICD-10-CM | POA: Diagnosis not present

## 2019-11-03 DIAGNOSIS — R079 Chest pain, unspecified: Secondary | ICD-10-CM

## 2019-11-03 DIAGNOSIS — I252 Old myocardial infarction: Secondary | ICD-10-CM

## 2019-11-03 DIAGNOSIS — R072 Precordial pain: Secondary | ICD-10-CM

## 2019-11-03 DIAGNOSIS — I169 Hypertensive crisis, unspecified: Principal | ICD-10-CM | POA: Diagnosis present

## 2019-11-03 DIAGNOSIS — F419 Anxiety disorder, unspecified: Secondary | ICD-10-CM | POA: Diagnosis present

## 2019-11-03 DIAGNOSIS — Z888 Allergy status to other drugs, medicaments and biological substances status: Secondary | ICD-10-CM

## 2019-11-03 DIAGNOSIS — E785 Hyperlipidemia, unspecified: Secondary | ICD-10-CM | POA: Diagnosis present

## 2019-11-03 DIAGNOSIS — J449 Chronic obstructive pulmonary disease, unspecified: Secondary | ICD-10-CM | POA: Diagnosis not present

## 2019-11-03 DIAGNOSIS — Z79899 Other long term (current) drug therapy: Secondary | ICD-10-CM

## 2019-11-03 DIAGNOSIS — Z91041 Radiographic dye allergy status: Secondary | ICD-10-CM

## 2019-11-03 DIAGNOSIS — Z79890 Hormone replacement therapy (postmenopausal): Secondary | ICD-10-CM

## 2019-11-03 DIAGNOSIS — I2510 Atherosclerotic heart disease of native coronary artery without angina pectoris: Secondary | ICD-10-CM | POA: Diagnosis present

## 2019-11-03 DIAGNOSIS — I4891 Unspecified atrial fibrillation: Secondary | ICD-10-CM | POA: Diagnosis not present

## 2019-11-03 DIAGNOSIS — R1013 Epigastric pain: Secondary | ICD-10-CM | POA: Diagnosis not present

## 2019-11-03 DIAGNOSIS — K8689 Other specified diseases of pancreas: Secondary | ICD-10-CM | POA: Diagnosis present

## 2019-11-03 DIAGNOSIS — Z7982 Long term (current) use of aspirin: Secondary | ICD-10-CM

## 2019-11-03 DIAGNOSIS — E7800 Pure hypercholesterolemia, unspecified: Secondary | ICD-10-CM | POA: Diagnosis present

## 2019-11-03 DIAGNOSIS — I700 Atherosclerosis of aorta: Secondary | ICD-10-CM | POA: Diagnosis present

## 2019-11-03 DIAGNOSIS — I25110 Atherosclerotic heart disease of native coronary artery with unstable angina pectoris: Secondary | ICD-10-CM | POA: Diagnosis present

## 2019-11-03 DIAGNOSIS — I160 Hypertensive urgency: Secondary | ICD-10-CM | POA: Diagnosis present

## 2019-11-03 DIAGNOSIS — K5900 Constipation, unspecified: Secondary | ICD-10-CM

## 2019-11-03 DIAGNOSIS — I71012 Dissection of descending thoracic aorta: Secondary | ICD-10-CM | POA: Diagnosis present

## 2019-11-03 DIAGNOSIS — R0602 Shortness of breath: Secondary | ICD-10-CM

## 2019-11-03 DIAGNOSIS — K859 Acute pancreatitis without necrosis or infection, unspecified: Secondary | ICD-10-CM

## 2019-11-03 DIAGNOSIS — Z66 Do not resuscitate: Secondary | ICD-10-CM | POA: Diagnosis present

## 2019-11-03 DIAGNOSIS — H409 Unspecified glaucoma: Secondary | ICD-10-CM | POA: Diagnosis present

## 2019-11-03 DIAGNOSIS — E039 Hypothyroidism, unspecified: Secondary | ICD-10-CM | POA: Diagnosis present

## 2019-11-03 DIAGNOSIS — G8929 Other chronic pain: Secondary | ICD-10-CM | POA: Diagnosis present

## 2019-11-03 DIAGNOSIS — J440 Chronic obstructive pulmonary disease with acute lower respiratory infection: Secondary | ICD-10-CM | POA: Diagnosis present

## 2019-11-03 DIAGNOSIS — I10 Essential (primary) hypertension: Secondary | ICD-10-CM | POA: Diagnosis present

## 2019-11-03 DIAGNOSIS — K581 Irritable bowel syndrome with constipation: Secondary | ICD-10-CM | POA: Diagnosis present

## 2019-11-03 DIAGNOSIS — Z823 Family history of stroke: Secondary | ICD-10-CM

## 2019-11-03 DIAGNOSIS — E038 Other specified hypothyroidism: Secondary | ICD-10-CM | POA: Diagnosis present

## 2019-11-03 DIAGNOSIS — Z809 Family history of malignant neoplasm, unspecified: Secondary | ICD-10-CM

## 2019-11-03 DIAGNOSIS — Z8249 Family history of ischemic heart disease and other diseases of the circulatory system: Secondary | ICD-10-CM

## 2019-11-03 DIAGNOSIS — Z825 Family history of asthma and other chronic lower respiratory diseases: Secondary | ICD-10-CM

## 2019-11-03 DIAGNOSIS — Z885 Allergy status to narcotic agent status: Secondary | ICD-10-CM

## 2019-11-03 DIAGNOSIS — R030 Elevated blood-pressure reading, without diagnosis of hypertension: Secondary | ICD-10-CM

## 2019-11-03 LAB — BASIC METABOLIC PANEL
Anion gap: 9 (ref 5–15)
BUN: 14 mg/dL (ref 8–23)
CO2: 24 mmol/L (ref 22–32)
Calcium: 9.9 mg/dL (ref 8.9–10.3)
Chloride: 106 mmol/L (ref 98–111)
Creatinine, Ser: 0.91 mg/dL (ref 0.44–1.00)
GFR calc Af Amer: >60 mL/min (ref >60)
GFR calc non Af Amer: 57 mL/min — ABNORMAL LOW (ref >60)
Glucose, Bld: 96 mg/dL (ref 70–99)
Potassium: 4.2 mmol/L (ref 3.5–5.1)
Sodium: 139 mmol/L (ref 135–145)

## 2019-11-03 LAB — URINALYSIS, ROUTINE W REFLEX MICROSCOPIC
Bilirubin Urine: NEGATIVE
Glucose, UA: NEGATIVE mg/dL
Ketones, ur: NEGATIVE mg/dL
Leukocytes,Ua: NEGATIVE
Nitrite: NEGATIVE
Protein, ur: NEGATIVE mg/dL
Specific Gravity, Urine: 1.005 (ref 1.005–1.030)
pH: 7 (ref 5.0–8.0)

## 2019-11-03 LAB — CBC
HCT: 39.3 % (ref 36.0–46.0)
Hemoglobin: 12.6 g/dL (ref 12.0–15.0)
MCH: 30.7 pg (ref 26.0–34.0)
MCHC: 32.1 g/dL (ref 30.0–36.0)
MCV: 95.6 fL (ref 80.0–100.0)
Platelets: 218 10*3/uL (ref 150–400)
RBC: 4.11 MIL/uL (ref 3.87–5.11)
RDW: 14.4 % (ref 11.5–15.5)
WBC: 6.9 10*3/uL (ref 4.0–10.5)
nRBC: 0 % (ref 0.0–0.2)

## 2019-11-03 LAB — HEPATIC FUNCTION PANEL
ALT: 21 U/L (ref 0–44)
AST: 42 U/L — ABNORMAL HIGH (ref 15–41)
Albumin: 4.2 g/dL (ref 3.5–5.0)
Alkaline Phosphatase: 53 U/L (ref 38–126)
Bilirubin, Direct: 0.5 mg/dL — ABNORMAL HIGH (ref 0.0–0.2)
Indirect Bilirubin: 0.5 mg/dL (ref 0.3–0.9)
Total Bilirubin: 1 mg/dL (ref 0.3–1.2)
Total Protein: 7 g/dL (ref 6.5–8.1)

## 2019-11-03 LAB — SARS CORONAVIRUS 2 BY RT PCR (HOSPITAL ORDER, PERFORMED IN ~~LOC~~ HOSPITAL LAB): SARS Coronavirus 2: NEGATIVE

## 2019-11-03 LAB — BRAIN NATRIURETIC PEPTIDE: B Natriuretic Peptide: 149.8 pg/mL — ABNORMAL HIGH (ref 0.0–100.0)

## 2019-11-03 LAB — LIPASE, BLOOD: Lipase: 65 U/L — ABNORMAL HIGH (ref 11–51)

## 2019-11-03 LAB — TSH: TSH: 6.815 u[IU]/mL — ABNORMAL HIGH (ref 0.350–4.500)

## 2019-11-03 LAB — PROTIME-INR
INR: 1 (ref 0.8–1.2)
Prothrombin Time: 12.6 seconds (ref 11.4–15.2)

## 2019-11-03 LAB — TROPONIN I (HIGH SENSITIVITY)
Troponin I (High Sensitivity): 5 ng/L (ref <18)
Troponin I (High Sensitivity): 7 ng/L (ref <18)

## 2019-11-03 MED ORDER — SIMVASTATIN 20 MG PO TABS
20.0000 mg | ORAL_TABLET | Freq: Every day | ORAL | Status: DC
  Administered 2019-11-04 – 2019-11-07 (×4): 20 mg via ORAL
  Filled 2019-11-03 (×4): qty 1

## 2019-11-03 MED ORDER — BISACODYL 5 MG PO TBEC
5.0000 mg | DELAYED_RELEASE_TABLET | Freq: Every day | ORAL | Status: DC | PRN
  Administered 2019-11-04: 5 mg via ORAL
  Filled 2019-11-03: qty 1

## 2019-11-03 MED ORDER — LABETALOL HCL 5 MG/ML IV SOLN
10.0000 mg | Freq: Once | INTRAVENOUS | Status: DC
  Filled 2019-11-03: qty 4

## 2019-11-03 MED ORDER — ONDANSETRON HCL 4 MG/2ML IJ SOLN: 4.0000 mg | Freq: Four times a day (QID) | INTRAMUSCULAR | Status: DC | PRN

## 2019-11-03 MED ORDER — ACETAMINOPHEN 325 MG PO TABS
650.0000 mg | ORAL_TABLET | Freq: Four times a day (QID) | ORAL | Status: DC | PRN
  Administered 2019-11-04: 650 mg via ORAL
  Filled 2019-11-03: qty 2

## 2019-11-03 MED ORDER — IOHEXOL 350 MG/ML SOLN
100.0000 mL | Freq: Once | INTRAVENOUS | Status: AC | PRN
  Administered 2019-11-03: 100 mL via INTRAVENOUS

## 2019-11-03 MED ORDER — ALBUTEROL SULFATE (2.5 MG/3ML) 0.083% IN NEBU: 3.0000 mL | INHALATION_SOLUTION | RESPIRATORY_TRACT | Status: DC | PRN

## 2019-11-03 MED ORDER — LEVOTHYROXINE SODIUM 100 MCG PO TABS
100.0000 ug | ORAL_TABLET | Freq: Every day | ORAL | Status: DC
  Administered 2019-11-04 – 2019-11-08 (×4): 100 ug via ORAL
  Filled 2019-11-03 (×5): qty 1

## 2019-11-03 MED ORDER — ACETAMINOPHEN 650 MG RE SUPP: 650.0000 mg | Freq: Four times a day (QID) | RECTAL | Status: DC | PRN

## 2019-11-03 MED ORDER — SODIUM CHLORIDE 0.9 % IV SOLN: 250.0000 mL | INTRAVENOUS | Status: DC | PRN

## 2019-11-03 MED ORDER — LABETALOL HCL 5 MG/ML IV SOLN
20.0000 mg | Freq: Once | INTRAVENOUS | Status: AC
  Administered 2019-11-03: 20 mg via INTRAVENOUS

## 2019-11-03 MED ORDER — LABETALOL HCL 5 MG/ML IV SOLN: 10.0000 mg | INTRAVENOUS | Status: DC | PRN

## 2019-11-03 MED ORDER — HYDROCORTISONE NA SUCCINATE PF 250 MG IJ SOLR
200.0000 mg | Freq: Once | INTRAMUSCULAR | Status: AC
  Administered 2019-11-03: 200 mg via INTRAVENOUS
  Filled 2019-11-03: qty 200

## 2019-11-03 MED ORDER — GUAIFENESIN-DM 100-10 MG/5ML PO SYRP: 5.0000 mL | ORAL_SOLUTION | ORAL | Status: DC | PRN

## 2019-11-03 MED ORDER — DORZOLAMIDE HCL-TIMOLOL MAL 2-0.5 % OP SOLN
1.0000 [drp] | Freq: Two times a day (BID) | OPHTHALMIC | Status: DC
  Administered 2019-11-04 – 2019-11-08 (×7): 1 [drp] via OPHTHALMIC
  Filled 2019-11-03: qty 10

## 2019-11-03 MED ORDER — ENOXAPARIN SODIUM 40 MG/0.4ML ~~LOC~~ SOLN
40.0000 mg | Freq: Every day | SUBCUTANEOUS | Status: DC
  Administered 2019-11-03 – 2019-11-06 (×4): 40 mg via SUBCUTANEOUS
  Filled 2019-11-03 (×4): qty 0.4

## 2019-11-03 MED ORDER — HYDRALAZINE HCL 10 MG PO TABS
10.0000 mg | ORAL_TABLET | Freq: Once | ORAL | Status: AC
  Administered 2019-11-03: 10 mg via ORAL
  Filled 2019-11-03: qty 1

## 2019-11-03 MED ORDER — SODIUM CHLORIDE 0.9% FLUSH
3.0000 mL | Freq: Two times a day (BID) | INTRAVENOUS | Status: DC
  Administered 2019-11-04 – 2019-11-06 (×2): 3 mL via INTRAVENOUS

## 2019-11-03 MED ORDER — LOSARTAN POTASSIUM 25 MG PO TABS
25.0000 mg | ORAL_TABLET | Freq: Every day | ORAL | Status: DC
  Administered 2019-11-04: 25 mg via ORAL
  Filled 2019-11-03: qty 1

## 2019-11-03 MED ORDER — HYDROXYZINE HCL 25 MG PO TABS
50.0000 mg | ORAL_TABLET | Freq: Once | ORAL | Status: AC
  Administered 2019-11-03: 50 mg via ORAL
  Filled 2019-11-03: qty 2

## 2019-11-03 MED ORDER — FENTANYL CITRATE (PF) 100 MCG/2ML IJ SOLN: 12.5000 ug | INTRAMUSCULAR | Status: DC | PRN

## 2019-11-03 MED ORDER — ONDANSETRON HCL 4 MG PO TABS: 4.0000 mg | ORAL_TABLET | Freq: Four times a day (QID) | ORAL | Status: DC | PRN

## 2019-11-03 MED ORDER — ASPIRIN EC 81 MG PO TBEC
81.0000 mg | DELAYED_RELEASE_TABLET | Freq: Every day | ORAL | Status: DC
  Administered 2019-11-04 – 2019-11-08 (×5): 81 mg via ORAL
  Filled 2019-11-03 (×5): qty 1

## 2019-11-03 MED ORDER — SODIUM CHLORIDE 0.9% FLUSH
3.0000 mL | Freq: Two times a day (BID) | INTRAVENOUS | Status: DC
  Administered 2019-11-03 – 2019-11-07 (×8): 3 mL via INTRAVENOUS

## 2019-11-03 MED ORDER — LATANOPROST 0.005 % OP SOLN
1.0000 [drp] | Freq: Every day | OPHTHALMIC | Status: DC
  Administered 2019-11-03 – 2019-11-07 (×5): 1 [drp] via OPHTHALMIC
  Filled 2019-11-03: qty 2.5

## 2019-11-03 MED ORDER — PANTOPRAZOLE SODIUM 40 MG PO TBEC
40.0000 mg | DELAYED_RELEASE_TABLET | Freq: Every day | ORAL | Status: DC
  Administered 2019-11-04 – 2019-11-05 (×2): 40 mg via ORAL
  Filled 2019-11-03 (×3): qty 1

## 2019-11-03 MED ORDER — LORAZEPAM 0.5 MG PO TABS
0.5000 mg | ORAL_TABLET | Freq: Two times a day (BID) | ORAL | Status: DC | PRN
  Filled 2019-11-03: qty 1

## 2019-11-03 MED ORDER — SENNOSIDES-DOCUSATE SODIUM 8.6-50 MG PO TABS
1.0000 | ORAL_TABLET | Freq: Every evening | ORAL | Status: DC | PRN
  Administered 2019-11-04: 1 via ORAL
  Filled 2019-11-03: qty 1

## 2019-11-03 MED ORDER — ONDANSETRON HCL 4 MG/2ML IJ SOLN
4.0000 mg | Freq: Once | INTRAMUSCULAR | Status: AC
  Administered 2019-11-03: 4 mg via INTRAVENOUS
  Filled 2019-11-03: qty 2

## 2019-11-03 MED ORDER — SODIUM CHLORIDE 0.9% FLUSH: 3.0000 mL | INTRAVENOUS | Status: DC | PRN

## 2019-11-03 NOTE — Telephone Encounter (Signed)
Returned the call to the patient. She stated that she has been having chest heaviness, nausea, shortness of breath and weakness for the past few days. While on the phone she was audibly short of breath and stated that she was nauseous. 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 She called asking for an appointment. She has been advised that she needs to call 911 and go to the hospital. She stated that she lives in [place] and did not know if the ambulance would take her to [facility]. She has been advised that she needs immediate help but stated that she would rather have her husband take her to [facility] ED instead. She will proceed by car to [facility].   She has once again been advised to seek emergent help.

## 2019-11-03 NOTE — ED Provider Notes (Signed)
[facility] Provider Note   CSN: [id number] Arrival date & time: 11/03/19  1241     History Chief Complaint  Patient presents with  . Chest Pain    Leah Pittman is a 86 y.o. female.  The history is provided by the patient and medical records. No language interpreter was used.  Chest Pain Pain location:  Substernal area and epigastric Pain quality: aching, crushing, dull and pressure   Pain radiates to:  Upper back, mid back and epigastrium Pain severity:  Severe Onset quality:  Gradual Duration:  4 days Timing:  Constant Progression:  Waxing and waning Chronicity:  New Relieved by:  Nothing Worsened by:  Nothing Ineffective treatments:  None tried Associated symptoms: abdominal pain, back pain, fatigue and nausea   Associated symptoms: no altered mental status, no cough, no diaphoresis, no dizziness, no fever, no headache, no lower extremity edema, no palpitations, no shortness of breath, no vomiting and no weakness   Risk factors: coronary artery disease and hypertension   Risk factors: not female        Past Medical History:  Diagnosis Date  . Anxiety   . Arthritis   . Chest pain 2011   CARDIOLITE - no significant symptoms, EKG changes, or arrhythmias  . Dyspnea 02/16/2012   2D ECHO - EF 55-60%, normal  . Fatigue   . Glaucoma   . Headache(784.0)   . Hiatal hernia   . High blood pressure 02/16/2012   RENAL DOPPLER - normal  . Hyperlipidemia   . Hypothyroidism   . Labile hypertension   . Lightheadedness   . Migraine headache   . Multiple allergies   . Myalgia   . Pain, lower leg    Calf  . Problem of menstruation   . Reflux   . SOB (shortness of breath)   . Thyroid disease   . Urinary problem     Patient Active Problem List   Diagnosis Date Noted  . Acute cystitis with hematuria 08/20/2019  . Hoarseness of voice 08/20/2019  . Medication management 08/20/2019  . SOB (shortness of breath) 08/20/2019  .  Constipation 08/20/2019  . Chronic obstructive pulmonary disease (HCC) 08/20/2019  . Epigastric pain 08/20/2019  . NSTEMI (non-ST elevated myocardial infarction) (HCC)   . Elevated troponin   . Chest pain 07/24/2019  . Hypertensive emergency 07/24/2019  . Neck pain 07/24/2019  . S/P CABG x 4 07/10/2018  . Coronary artery disease involving native coronary artery of native heart without angina pectoris 07/08/2018  . Essential hypertension 08/14/2017  . Hypercholesterolemia 11/30/2015  . Precordial chest pain 01/13/2015  . Chest pain with high risk for cardiac etiology 12/12/2013  . Glaucoma 09/29/2012  . Anxiety   . Labile hypertension   . Migraine headache     Past Surgical History:  Procedure Laterality Date  . ABDOMINAL HYSTERECTOMY  1976  . CHOLECYSTECTOMY  2000  . CORONARY ARTERY BYPASS GRAFT N/A 07/10/2018   Procedure: CORONARY ARTERY BYPASS GRAFTING (CABG), FREE LIMA;  Surgeon: Alleen BorneBartle, Bryan K, MD;  Location: [facility] OR;  Service: Open Heart Surgery;  Laterality: N/A;  . CYSTECTOMY  1974   Intestine  . CYSTECTOMY  1996   Brain stem  . EYE SURGERY  2003  . LEFT HEART CATH AND CORONARY ANGIOGRAPHY N/A 07/08/2018   Procedure: LEFT HEART CATH AND CORONARY ANGIOGRAPHY;  Surgeon: Runell GessBerry, Jonathan J, MD;  Location: [facility] INVASIVE CV LAB;  Service: Cardiovascular;  Laterality: N/A;  . LEFT HEART  CATH AND CORONARY ANGIOGRAPHY N/A 07/25/2019   Procedure: LEFT HEART CATH AND CORONARY ANGIOGRAPHY;  Surgeon: Kathleene Hazel, MD;  Location: [facility] INVASIVE CV LAB;  Service: Cardiovascular;  Laterality: N/A;  . TEE WITHOUT CARDIOVERSION N/A 07/10/2018   Procedure: TRANSESOPHAGEAL ECHOCARDIOGRAM (TEE);  Surgeon: [name], MD;  Location: [facility] OR;  Service: Open Heart Surgery;  Laterality: N/A;     OB History   No obstetric history on file.     Family History  Problem Relation Age of Onset  . Heart attack Father   . Stroke Father   . Cancer Sister   . Cancer Brother   . Asthma Brother    . Cancer Brother     Social History   Tobacco Use  . Smoking status: Never Smoker  . Smokeless tobacco: Never Used  Substance Use Topics  . Alcohol use: No  . Drug use: No    Home Medications Prior to Admission medications   Medication Sig Start Date End Date Taking? Authorizing Provider  acetaminophen (TYLENOL) 325 MG tablet Take 975 mg by mouth every 8 (eight) hours as needed for mild pain or headache.  09/29/14   [provider]  aspirin 81 MG tablet Take 81 mg by mouth daily.    [provider]  Calcium Citrate-Vitamin D (CALCIUM CITRATE+D3 PETITES PO) Take 1 tablet by mouth daily.    [provider]  Docusate Sodium (STOOL SOFTENER LAXATIVE PO) Take by mouth.    [provider]  dorzolamide-timolol (COSOPT) 22.3-6.8 MG/ML ophthalmic solution Place 1 drop into the left eye 2 (two) times daily.  06/05/12   [provider]  HYDROcodone-acetaminophen (NORCO/VICODIN) 5-325 MG tablet Take 1 tablet by mouth every 6 (six) hours as needed for moderate pain.    [provider]  latanoprost (XALATAN) 0.005 % ophthalmic solution Place 1 drop into the left eye at bedtime. 07/23/19 07/22/20  [provider]  levothyroxine (SYNTHROID) 100 MCG tablet TAKE 1 TABLET BY MOUTH EVERY DAY 10/08/19   Marianne Sofia, PA-C  LORazepam (ATIVAN) 1 MG tablet TAKE 1 TABLET(1 MG) BY MOUTH TWICE DAILY AS NEEDED FOR ANXIETY 09/19/19   [name], [name], MD  losartan (COZAAR) 25 MG tablet TAKE 1 TABLET(25 MG) BY MOUTH 1 TIME PER DAY Patient taking differently: Take 25 mg by mouth daily.  05/01/19   [name], Fritzi Mandes, MD  magnesium oxide (MAG-OX) 400 MG tablet Take 400 mg by mouth daily.    [provider]  nitroGLYCERIN (NITROSTAT) 0.4 MG SL tablet Place 0.4 mg under the tongue every 5 (five) minutes as needed for chest pain.  05/10/18   [provider]  ondansetron (ZOFRAN) 8 MG tablet Take 1 tablet (8 mg total) by mouth 2 (two) times daily as needed for  nausea or vomiting. 07/17/19   [name], Fritzi Mandes, MD  pantoprazole (PROTONIX) 40 MG tablet Take 1 tablet (40 mg total) by mouth daily. 07/17/19   CoxFritzi Mandes, MD  Probiotic Product (PROBIOTIC DAILY PO) Take 1 capsule by mouth daily.    [provider]  ranolazine (RANEXA) 500 MG 12 hr tablet TAKE 1 TABLET BY MOUTH TWICE A DAY 08/21/19   Marianne Sofia, PA-C  simvastatin (ZOCOR) 20 MG tablet TAKE 1 TABLET BY MOUTH EVERYDAY AT BEDTIME 08/05/19   [name], Fritzi Mandes, MD  Vitamin D, Cholecalciferol, 50 MCG (2000 UT) CAPS Take 1 tablet by mouth daily.    [provider]    Allergies    Benadryl [diphenhydramine], Codeine, Meperidine, Prednisone, Promethazine,  Propranolol, Shellfish allergy, Tape, Tazarotene, and Iodinated diagnostic agents  Review of Systems   Review of Systems  Constitutional: Positive for fatigue. Negative for chills, diaphoresis and fever.  HENT: Negative for congestion.   Eyes: Negative for visual disturbance.  Respiratory: Positive for chest tightness. Negative for cough, shortness of breath and wheezing.   Cardiovascular: Positive for chest pain. Negative for palpitations and leg swelling.  Gastrointestinal: Positive for abdominal pain and nausea. Negative for abdominal distention, constipation, diarrhea and vomiting.  Genitourinary: Negative for dysuria, flank pain and frequency.  Musculoskeletal: Positive for back pain.  Neurological: Positive for light-headedness. Negative for dizziness, weakness and headaches.  Psychiatric/Behavioral: Negative for agitation and confusion.  All other systems reviewed and are negative.   Physical Exam Updated Vital Signs BP (!) 189/59 (BP Location: Left Arm)   Pulse 63   Temp 98.1 F (36.7 C) (Oral)   Resp 20   Ht 5\' 4"  (1.626 m)   Wt 53.5 kg   SpO2 98%   BMI 20.25 kg/m   Physical Exam Vitals and nursing note reviewed.  Constitutional:      General: She is not in acute distress.    Appearance: She is well-developed. She  is not ill-appearing, toxic-appearing or diaphoretic.  HENT:     Head: Normocephalic and atraumatic.  Eyes:     Conjunctiva/sclera: Conjunctivae normal.     Pupils: Pupils are equal, round, and reactive to light.  Cardiovascular:     Rate and Rhythm: Normal rate and regular rhythm.     Heart sounds: Normal heart sounds. No murmur heard.   Pulmonary:     Effort: Pulmonary effort is normal. No respiratory distress.     Breath sounds: Normal breath sounds. No decreased breath sounds, wheezing, rhonchi or rales.  Chest:     Chest wall: Tenderness present.  Abdominal:     Palpations: Abdomen is soft.     Tenderness: There is abdominal tenderness.  Musculoskeletal:        General: Normal range of motion.     Cervical back: Neck supple.     Right lower leg: No tenderness. No edema.     Left lower leg: No tenderness. No edema.  Skin:    General: Skin is warm and dry.     Capillary Refill: Capillary refill takes less than 2 seconds.  Neurological:     General: No focal deficit present.     Mental Status: She is alert.  Psychiatric:        Mood and Affect: Mood is anxious.     ED Results / Procedures / Treatments   Labs (all labs ordered are listed, but only abnormal results are displayed) Labs Reviewed  BASIC METABOLIC PANEL - Abnormal; Notable for the following components:      Result Value   GFR calc non Af Amer 57 (*)    All other components within normal limits  SARS CORONAVIRUS 2 BY RT PCR (HOSPITAL ORDER, PERFORMED IN [place] [facility])  URINE CULTURE  CBC  HEPATIC FUNCTION PANEL  LIPASE, BLOOD  URINALYSIS, ROUTINE W REFLEX MICROSCOPIC  TSH  BRAIN NATRIURETIC PEPTIDE  TROPONIN I (HIGH SENSITIVITY)  TROPONIN I (HIGH SENSITIVITY)    EKG EKG Interpretation  Date/Time:  Monday November 03 2019 12:48:19 EDT Ventricular Rate:  68 PR Interval:  140 QRS Duration: 64 QT Interval:  392 QTC Calculation: 416 R Axis:   78 Text Interpretation: Normal sinus  rhythm Nonspecific ST abnormality Abnormal ECG When compared to prior,  no significant cahnges seen. No STEMI Confirmed by Theda Belfast ([id number]) on 11/03/2019 1:43:52 PM   Radiology DG Chest 2 View  Result Date: 11/03/2019 CLINICAL DATA:  Chest pain. EXAM: CHEST - 2 VIEW COMPARISON:  Chest x-ray dated July 24, 2019. FINDINGS: The heart size and mediastinal contours are within normal limits. Prior CABG. Normal pulmonary vascularity. No focal consolidation, pleural effusion, or pneumothorax. No acute osseous abnormality. IMPRESSION: No active cardiopulmonary disease. Electronically Signed   By: Obie Dredge M.D.   On: 11/03/2019 13:27    Procedures Procedures (including critical care time)  CRITICAL CARE Performed by: Canary Brim Lurleen Soltero Total critical care time: 35 minutes Critical care time was exclusive of separately billable procedures and treating other patients. Critical care was necessary to treat or prevent imminent or life-threatening deterioration. Critical care was time spent personally by me on the following activities: development of treatment plan with patient and/or surrogate as well as nursing, discussions with consultants, evaluation of patient's response to treatment, examination of patient, obtaining history from patient or surrogate, ordering and performing treatments and interventions, ordering and review of laboratory studies, ordering and review of radiographic studies, pulse oximetry and re-evaluation of patient's condition.   Medications Ordered in ED Medications  hydrOXYzine (ATARAX/VISTARIL) tablet 50 mg (has no administration in time range)  ondansetron (ZOFRAN) injection 4 mg (4 mg Intravenous Given 11/03/19 1510)  hydrocortisone sodium succinate (SOLU-CORTEF) injection 200 mg (200 mg Intravenous Given 11/03/19 1605)  labetalol (NORMODYNE) injection 20 mg (20 mg Intravenous Given 11/03/19 1510)    ED Course  I have reviewed the triage vital signs and the  nursing notes.  Pertinent labs & imaging results that were available during my care of the patient were reviewed by me and considered in my medical decision making (see chart for details).    MDM Rules/Calculators/A&P                          Leah Pittman is a 86 y.o. female with a past medical history significant for labile hypertension, CAD status post CABG, hypercholesterolemia, COPD, prior hypertensive emergency, and migraines who presents with chest pain, shortness of breath, lightheadedness/fatigue, and elevated blood pressures.  Patient reports that for the last week, she is been having chest discomfort on and off.  She reports that acutely worsened yesterday and her blood pressure was found to be very elevated.  On arrival, blood pressure was over 225 systolic.  She reports she is having a severe pain in her chest and abdomen that goes through to her back.  She reports this feels similar when she had her cardiac pain requiring bypass in the past.  She reports she was short of breath with it and lightheaded.  She reports some nausea but no vomiting.  She denies constipation, diarrhea, or urinary changes.  She denies trauma.  She denies fevers or chills.  She does report occasional cough with congestion but this is been ongoing for "a year".  She called her cardiologist and they told her to come to the emergency department for evaluation.  On exam, lungs are clear and chest is tender to palpation.  Back is nontender.  Abdomen is tender to palpation.  Bowel sounds were appreciated.  Patient had pulses in all extremities.  Her legs were not significantly edematous.  She has no focal neurologic deficits on my exam but she does report chronic tingling and numbness in her legs that is unchanged from her baseline.  EKG shows no STEMI.  Given the fact that her blood pressure was over 220 systolic, she is having pain in her chest, abdomen, and back, we will get a dissection study to rule out aortic  etiology of her symptoms.  Patient has history of nausea with contrast however she does say that she has had multiple images in the past since the reaction without any difficulty.  We offered her a pretreatment with steroids and Benadryl but she refused as she has had complications with those medicines in the past.  She would rather do without.  She agrees with the imaging as well as blood pressure medicine.  She is tolerating beta-blocker in the past per the chart however she is also had nausea with it.  We will give her Zofran initially before giving the blood pressure medication.  I suspect hypertensive emergency may be contributing to her symptoms however we will make sure she does not have any cardiac problem or a dissection.  Given the patient reporting that her pain feels similar to prior NSTEMI requiring CABG, we will likely touch base with cardiology after work-up is completed.   3:13 PM I spoke with the CT imaging team and they spoke to the patient and realized that she has been given pretreatment prior to CT contrast in the past.  Patient is agreeable to IV steroids 4 hours prior to imaging followed by oral Vistaril 1 hour prior to image collection.  This was ordered.   Care will be transferred to oncoming team while awaiting for results of CT imaging to rule out dissection. If work-up otherwise reassuring, anticipate consultation with cardiology for further management of chest discomfort that feels similar to prior MI. Anticipate admission for concerning chest pain in the setting of elevated blood pressures which may be hypertensive emergency recurrence.   Final Clinical Impression(s) / ED Diagnoses Final diagnoses:  Precordial pain  Elevated blood pressure reading  Shortness of breath    Clinical Impression: 1. Precordial pain   2. Elevated blood pressure reading   3. Shortness of breath     Disposition: Care transferred to oncoming team while waiting for results of diagnostic  work-up prior to admission.  This note was prepared with assistance of Conservation officer, historic buildings. Occasional wrong-word or sound-a-like substitutions may have occurred due to the inherent limitations of voice recognition software.     Quartez Lagos, Canary Brim, MD 11/03/19 1626

## 2019-11-03 NOTE — ED Triage Notes (Signed)
Pt arrives to ED w/ c/o 5/10 generalized chest pressure that started 1 week ago and was at its worst yesterday. Pt endorses nausea, sob, and weakness.

## 2019-11-03 NOTE — Telephone Encounter (Signed)
Pt c/o of Chest Pain: STAT if CP now or developed within 24 hours  1. Are you having CP right now? Yes  2. Are you experiencing any other symptoms (ex. SOB, nausea, vomiting, sweating)? SOB, fatigue, and nausea   3. How long have you been experiencing CP? Over the past week off and on   4. Is your CP continuous or coming and going? Continuous    5. Have you taken Nitroglycerin? No   Is requesting an appointment as soon as possible. States she feels terrible.  ?

## 2019-11-03 NOTE — ED Notes (Signed)
CT notified of vistaril given and will follow up in one hour to go to CT.

## 2019-11-03 NOTE — H&P (Signed)
History and Physical    ESTEFANY GOEBEL MGQ:[id number] DOB: 1930-09-01 DOA: 11/03/2019  PCP: Blane Ohara, MD   Patient coming from: Home   Chief Complaint: Chest pain, SOB   HPI: Leah Pittman is a 86 y.o. female with medical history significant for CAD status post CABG in May 2020, hypertension, anxiety, and hypothyroidism, now presenting to the emergency department for evaluation of chest pain.  The patient reports chest pain, shortness of breath, and fatigue for the past several days that became severe yesterday and is now constant.  Symptoms seem to be worse with exertion but do not resolve with rest.  She describes a severe pressure sensation over the central chest, sometimes radiating to the right chest, and associated with shortness of breath without wheezing or fever.  She reports some low blood pressure readings at home and does not take her antihypertensives every day because of this.  She denies any headache, change in vision or hearing, or focal weakness, but reports bilateral foot numbness that seems to be chronic.  She has not tried any nitroglycerin for the current symptoms.  She reports chronic epigastric pain and chronic constipation that seems to be worse in recent months and for which she was planning to follow-up with her gastroenterologist who she has not seen in over a year.  ED Course: Upon arrival to the ED, patient is found to be afebrile, saturating well on room air, and hypertensive to 230/130.  EKG features a sinus rhythm.  Chest x-ray is negative for acute cardiopulmonary disease.  CTA chest/abdomen/pelvis is negative for acute findings but notable for possible stable type B aortic dissection versus noncalcified thrombus involving the aortic arch, as well as persistent dilation of the pancreatic duct which could reflect papillary mucinous neoplasm.  Chemistry panel and CBC are unremarkable.  Covid PCR is negative.  High-sensitivity troponin is normal x2.  BNP is mildly  elevated.  TSH is elevated to 6.815.  Patient was treated with labetalol and hydralazine in the ED.  Cardiology was consulted by the ED physician and hospitalists asked to admit.  Review of Systems:  All other systems reviewed and apart from HPI, are negative.  Past Medical History:  Diagnosis Date  . Anxiety   . Arthritis   . Chest pain 2011   CARDIOLITE - no significant symptoms, EKG changes, or arrhythmias  . Dyspnea 02/16/2012   2D ECHO - EF 55-60%, normal  . Fatigue   . Glaucoma   . Headache(784.0)   . Hiatal hernia   . High blood pressure 02/16/2012   RENAL DOPPLER - normal  . Hyperlipidemia   . Hypothyroidism   . Labile hypertension   . Lightheadedness   . Migraine headache   . Multiple allergies   . Myalgia   . Pain, lower leg    Calf  . Problem of menstruation   . Reflux   . SOB (shortness of breath)   . Thyroid disease   . Urinary problem     Past Surgical History:  Procedure Laterality Date  . ABDOMINAL HYSTERECTOMY  1976  . CHOLECYSTECTOMY  2000  . CORONARY ARTERY BYPASS GRAFT N/A 07/10/2018   Procedure: CORONARY ARTERY BYPASS GRAFTING (CABG), FREE LIMA;  Surgeon: [name], MD;  Location: [facility] OR;  Service: Open Heart Surgery;  Laterality: N/A;  . CYSTECTOMY  1974   Intestine  . CYSTECTOMY  1996   Brain stem  . EYE SURGERY  2003  . LEFT HEART CATH AND CORONARY ANGIOGRAPHY  N/A 07/08/2018   Procedure: LEFT HEART CATH AND CORONARY ANGIOGRAPHY;  Surgeon: Runell Gess, MD;  Location: [facility] INVASIVE CV LAB;  Service: Cardiovascular;  Laterality: N/A;  . LEFT HEART CATH AND CORONARY ANGIOGRAPHY N/A 07/25/2019   Procedure: LEFT HEART CATH AND CORONARY ANGIOGRAPHY;  Surgeon: Kathleene Hazel, MD;  Location: [facility] INVASIVE CV LAB;  Service: Cardiovascular;  Laterality: N/A;  . TEE WITHOUT CARDIOVERSION N/A 07/10/2018   Procedure: TRANSESOPHAGEAL ECHOCARDIOGRAM (TEE);  Surgeon: [name], MD;  Location[facility];  Service: Open Heart Surgery;  Laterality:  N/A;    Social History:   reports that she has never smoked. She has never used smokeless tobacco. She reports that she does not drink alcohol and does not use drugs.  Allergies  Allergen Reactions  . Benadryl [Diphenhydramine] Swelling and Other (See Comments)    Tongue swells and hallucinates- could not talk  . Codeine Nausea And Vomiting and Hypertension  . Meperidine Nausea Only, Swelling and Other (See Comments)    Tongue swells and "I feel like death"  . Prednisone     Hypertension   . Promethazine Other (See Comments)    Hypotension   . Propranolol Nausea And Vomiting  . Shellfish Allergy Nausea And Vomiting  . Tape Other (See Comments)    Tears the skin  . Tazarotene Other (See Comments)    Reaction not recalled   . Iodinated Diagnostic Agents Nausea Only and Rash    Family History  Problem Relation Age of Onset  . Heart attack Father   . Stroke Father   . Cancer Sister   . Cancer Brother   . Asthma Brother   . Cancer Brother      Prior to Admission medications   Medication Sig Start Date End Date Taking? Authorizing Provider  acetaminophen (TYLENOL) 325 MG tablet Take 975 mg by mouth every 8 (eight) hours as needed for mild pain or headache.  09/29/14  Yes [provider]  aspirin 81 MG tablet Take 81 mg by mouth daily.   Yes [provider]  Calcium Citrate-Vitamin D (CALCIUM CITRATE+D3 PETITES PO) Take 1 tablet by mouth daily.   Yes [provider]  dorzolamide-timolol (COSOPT) 22.3-6.8 MG/ML ophthalmic solution Place 1 drop into the left eye 2 (two) times daily.  06/05/12  Yes [provider]  latanoprost (XALATAN) 0.005 % ophthalmic solution Place 1 drop into both eyes at bedtime.  07/23/19 07/22/20 Yes [provider]  levothyroxine (SYNTHROID) 100 MCG tablet TAKE 1 TABLET BY MOUTH EVERY DAY Patient taking differently: Take 100 mcg by mouth daily before breakfast.  10/08/19  Yes Marianne Sofia, PA-C  LORazepam (ATIVAN) 1  MG tablet TAKE 1 TABLET(1 MG) BY MOUTH TWICE DAILY AS NEEDED FOR ANXIETY Patient taking differently: Take 0.5 mg by mouth 2 (two) times daily as needed for anxiety.  09/19/19  Yes [name], [name], MD  losartan (COZAAR) 25 MG tablet TAKE 1 TABLET(25 MG) BY MOUTH 1 TIME PER DAY Patient taking differently: Take 25 mg by mouth daily.  05/01/19  Yes [name], [name], MD  magnesium oxide (MAG-OX) 400 MG tablet Take 400 mg by mouth daily. Chewable   Yes [provider]  nitroGLYCERIN (NITROSTAT) 0.4 MG SL tablet Place 0.4 mg under the tongue every 5 (five) minutes as needed for chest pain.  05/10/18  Yes [provider]  ondansetron (ZOFRAN) 8 MG tablet Take 1 tablet (8 mg total) by mouth 2 (two) times daily as needed for nausea or  vomiting. 07/17/19  Yes [name], [name], MD  pantoprazole (PROTONIX) 40 MG tablet Take 1 tablet (40 mg total) by mouth daily. 07/17/19  Yes [name], [name], MD  simvastatin (ZOCOR) 20 MG tablet TAKE 1 TABLET BY MOUTH EVERYDAY AT BEDTIME Patient taking differently: Take 20 mg by mouth at bedtime.  08/05/19  Yes [name], [name], MD  Vitamin D, Cholecalciferol, 50 MCG (2000 UT) CAPS Take 1 tablet by mouth daily.   Yes [provider]  Probiotic Product (PROBIOTIC DAILY PO) Take 1 capsule by mouth daily.    [provider]  ranolazine (RANEXA) 500 MG 12 hr tablet TAKE 1 TABLET BY MOUTH TWICE A DAY Patient not taking: Reported on 11/03/2019 08/21/19   Marianne Sofia, PA-C    Physical Exam: Vitals:   11/03/19 1856 11/03/19 2000 11/03/19 2148 11/03/19 2300  BP: (!) 177/65 (!) 165/53 (!) 158/57 (!) 138/58  Pulse: 65 63 67 66  Resp: 20 17 15  (!) 27  Temp:      TempSrc:      SpO2: 99% 99% 98% 98%  Weight:      Height:        Constitutional: NAD, anxious  Eyes: PERTLA, lids and conjunctivae normal ENMT: Mucous membranes are moist. Posterior pharynx clear of any exudate or lesions.   Neck: normal, supple, no masses, no thyromegaly Respiratory: no wheezing, no  crackles. No accessory muscle use.  Cardiovascular: S1 & S2 heard, regular rate and rhythm. No extremity edema.   Abdomen: No distension, no tenderness, soft. Bowel sounds active.  Musculoskeletal: no clubbing / cyanosis. No joint deformity upper and lower extremities.   Skin: no significant rashes, lesions, ulcers. Warm, dry, well-perfused. Neurologic: CN 2-12 grossly intact. Sensation to light touch diminished in feet b/l. Moving all extremities.  Psychiatric: Alert and oriented to person, place, and situation. Anxious, cooperative.    Labs and Imaging on Admission: I have personally reviewed following labs and imaging studies  CBC: Recent Labs  Lab 11/03/19 1252  WBC 6.9  HGB 12.6  HCT 39.3  MCV 95.6  PLT 218   Basic Metabolic Panel: Recent Labs  Lab 11/03/19 1252  NA 139  K 4.2  CL 106  CO2 24  GLUCOSE 96  BUN 14  CREATININE 0.91  CALCIUM 9.9   GFR: Estimated Creatinine Clearance: 37.5 mL/min (by C-G formula based on SCr of 0.91 mg/dL). Liver Function Tests: Recent Labs  Lab 11/03/19 1528  AST 42*  ALT 21  ALKPHOS 53  BILITOT 1.0  PROT 7.0  ALBUMIN 4.2   Recent Labs  Lab 11/03/19 1528  LIPASE 65*   No results for input(s): AMMONIA in the last 168 hours. Coagulation Profile: Recent Labs  Lab 11/03/19 1648  INR 1.0   Cardiac Enzymes: No results for input(s): CKTOTAL, CKMB, CKMBINDEX, TROPONINI in the last 168 hours. BNP (last 3 results) No results for input(s): PROBNP in the last 8760 hours. HbA1C: No results for input(s): HGBA1C in the last 72 hours. CBG: No results for input(s): GLUCAP in the last 168 hours. Lipid Profile: No results for input(s): CHOL, HDL, LDLCALC, TRIG, CHOLHDL, LDLDIRECT in the last 72 hours. Thyroid Function Tests: Recent Labs    11/03/19 1528  TSH 6.815*   Anemia Panel: No results for input(s): VITAMINB12, FOLATE, FERRITIN, TIBC, IRON, RETICCTPCT in the last 72 hours. Urine analysis:    Component Value Date/Time    COLORURINE STRAW (A) 11/03/2019 1600   APPEARANCEUR CLEAR 11/03/2019 1600   LABSPEC 1.005 11/03/2019 1600  PHURINE 7.0 11/03/2019 1600   GLUCOSEU NEGATIVE 11/03/2019 1600   HGBUR SMALL (A) 11/03/2019 1600   BILIRUBINUR NEGATIVE 11/03/2019 1600   BILIRUBINUR negative 08/20/2019 1118   KETONESUR NEGATIVE 11/03/2019 1600   PROTEINUR NEGATIVE 11/03/2019 1600   UROBILINOGEN 0.2 08/20/2019 1118   NITRITE NEGATIVE 11/03/2019 1600   LEUKOCYTESUR NEGATIVE 11/03/2019 1600   Sepsis Labs: (procalcitonin:4,lacticidven:4) ) Recent Results (from the past 240 hour(s))  SARS Coronavirus 2 by RT PCR (hospital order, performed in [facility] hospital lab) Nasopharyngeal Nasopharyngeal Swab     Status: None   Collection Time: 11/03/19  3:34 PM   Specimen: Nasopharyngeal Swab  Result Value Ref Range Status   SARS Coronavirus 2 NEGATIVE NEGATIVE Final    Comment: (NOTE) SARS-CoV-2 target nucleic acids are NOT DETECTED.  The SARS-CoV-2 RNA is generally detectable in upper and lower respiratory specimens during the acute phase of infection. The lowest concentration of SARS-CoV-2 viral copies this assay can detect is 250 copies / mL. A negative result does not preclude SARS-CoV-2 infection and should not be used as the sole basis for treatment or other patient management decisions.  A negative result may occur with improper specimen collection / handling, submission of specimen other than nasopharyngeal swab, presence of viral mutation(s) within the areas targeted by this assay, and inadequate number of viral copies (<250 copies / mL). A negative result must be combined with clinical observations, patient history, and epidemiological information.  Fact Sheet for Patients:   BoilerBrush.com.cy  Fact Sheet for Healthcare Providers: https://pope.com/  This test is not yet approved or  cleared by the [place] FDA and has been  authorized for detection and/or diagnosis of SARS-CoV-2 by FDA under an Emergency Use Authorization (EUA).  This EUA will remain in effect (meaning this test can be used) for the duration of the COVID-19 declaration under Section 564(b)(1) of the Act, 21 U.S.C. section 360bbb-3(b)(1), unless the authorization is terminated or revoked sooner.  Performed at [facility], [address], [place], [place] [address]      Radiological Exams on Admission: DG Chest 2 View  Result Date: 11/03/2019 CLINICAL DATA:  Chest pain. EXAM: CHEST - 2 VIEW COMPARISON:  Chest x-ray dated July 24, 2019. FINDINGS: The heart size and mediastinal contours are within normal limits. Prior CABG. Normal pulmonary vascularity. No focal consolidation, pleural effusion, or pneumothorax. No acute osseous abnormality. IMPRESSION: No active cardiopulmonary disease. Electronically Signed   By: Obie Dredge M.D.   On: 11/03/2019 13:27   CT Angio Chest/Abd/Pel for Dissection W and/or Wo Contrast  Result Date: 11/03/2019 CLINICAL DATA:  Chest pain and back pain, aortic dissection suspected. Blood pressure over 225. EXAM: CT ANGIOGRAPHY CHEST, ABDOMEN AND PELVIS TECHNIQUE: Non-contrast CT of the chest was initially obtained. Multidetector CT imaging through the chest, abdomen and pelvis was performed using the standard protocol during bolus administration of intravenous contrast. Multiplanar reconstructed images and MIPs were obtained and reviewed to evaluate the vascular anatomy. "Pt allergic to contrast- had 4 hour prep ( but is allergic to benedryl and prednisone so used alternate meds.)" CONTRAST:  OMNIPAQUE IOHEXOL 350 MG/ML SOLN COMPARISON:  CT coronary 05/06/2018, CT angio chest 04/22/2019, CT abdomen pelvis 07/13/2017, CT angio chest 01/30/2019. FINDINGS: CTA CHEST FINDINGS Cardiovascular: Preferential opacification of the thoracic aorta. Similar-appearing crescentic peripheral hypodensity along the aortic arch  that originates just distal to left subclavian artery (10:55-57, 11:79-82) and extends to the origin of the descending thoracic aorta. This finding is present  on prior CT chest 04/22/2019 and 01/30/2019; however, is very poorly visualized and evaluated on the prior studies due to non-optimization for the visualization of the aorta on these prior studies. No evidence of thoracic aortic aneurysm. No periaortic fat stranding. Normal heart size. No significant pericardial effusion. The main pulmonary artery is normal in caliber. No central or segmental pulmonary embolus. At least 4 vessel mild coronary artery calcifications in a patient status post coronary artery bypass graft. Mediastinum/Nodes: Calcified right hilar lymph node (7:78). Prominent but nonenlarged left hilar lymph node (7:70). No enlarged mediastinal, hilar, or axillary lymph nodes. Thyroid gland, trachea, and esophagus demonstrate no significant findings. Lungs/Pleura: Few scattered calcified granuloma. Biapical pleural/pulmonary scarring. Few scattered pulmonary micro nodules. Right basilar 5 mm pulmonary nodule (7:129). Left basilar 4 mm pulmonary nodule (7:125). No pulmonary mass. No focal consolidation. No pleural effusion or pneumothorax. Musculoskeletal: No chest wall abnormality. No acute or significant osseous findings. No acute displaced fracture. Review of the MIP images confirms the above findings. CTA ABDOMEN AND PELVIS FINDINGS VASCULAR Aorta: Severe calcified and noncalcified atherosclerotic plaque. Normal caliber aorta without aneurysm, dissection, vasculitis or significant stenosis. No periaortic fat stranding. Celiac: Patent without evidence of aneurysm, dissection, vasculitis or significant stenosis. SMA: Patent without evidence of aneurysm, dissection, vasculitis or significant stenosis. Replaced right hepatic artery originating off of the superior mesenteric artery. Renals: The right renal artery is diminutive in size compared to the  left. Both renal arteries are patent without evidence of aneurysm, dissection, vasculitis, fibromuscular dysplasia or significant stenosis. IMA: Patent without evidence of aneurysm, dissection, vasculitis or significant stenosis. Iliacs: Mild to moderate calcified and noncalcified atherosclerotic plaque. Patent without evidence of aneurysm, dissection, vasculitis or significant stenosis. Veins: No obvious venous abnormality within the limitations of this arterial phase study. Other: Mild soft tissue density along the right femoral vessels likely scarring for prior access. No aneurysm identified at this level. Review of the MIP images confirms the above findings. NON-VASCULAR Hepatobiliary: No focal liver abnormality is seen. Hypodense appearance of the hepatic parenchyma mid to the splenic parenchyma likely due to timing of contrast. Status post cholecystectomy. No biliary dilatation. Pancreas: Diffusely atrophic. Persistent main pancreatic duct dilatation within the head and neck of the pancreas measuring up to 5 mm. Spleen: Normal in size without focal abnormality. Adrenals/Urinary Tract: Adrenal glands are unremarkable. Kidneys are normal, without renal calculi, focal lesion, or hydronephrosis. Urinary bladder is unremarkable. Stomach/Bowel: Stomach is within normal limits. The appendix is not definitely identified. No evidence of bowel wall thickening, distention, or inflammatory changes. Diffuse sigmoid diverticulosis. Lymphatic: No abdominal, pelvic, inguinal lymphadenopathy. Reproductive: Status post hysterectomy. No adnexal masses. Other: No abdominal wall hernia or abnormality. No abdominopelvic ascites. No free intraperitoneal gas. No organized fluid collection. Musculoskeletal: No acute or significant osseous findings. Review of the MIP images confirms the above findings. IMPRESSION: 1. Similar-appearing peripheral crescentic hypodensity along the aortic arch that originates just distal to the left  subclavian artery and terminates at the origin of the descending thoracic aorta that may represent a stable type B aortic dissection versus noncalcified thrombus in a patient with severe calcified and noncalcified atherosclerotic plaque of the aorta and its branches. No aorta aneurysm or intramural hematoma. 2. Persistent main pancreatic duct dilatation up to 5 mm within the head and neck of the pancreas. Findings may represent intraductal papillary mucinous neoplasm. 3. Couple of bibasilar pulmonary nodules measuring up to 5 mm in a patient demonstrating sequela of prior granulomatous disease. These results were called  by telephone at the time of interpretation on 11/03/2019 at 9:20 pm to provider Dr. Jacqulyn BathLong, who verbally acknowledged these results. Electronically Signed   By: Tish FredericksonMorgane  Naveau M.D.   On: 11/03/2019 21:18    EKG: Independently reviewed. Sinus rhythm.    Assessment/Plan   1. Chest pain; CAD  - Presents with several days of chest discomfort and SOB, is found to have severely elevated BP, normal HS troponin x2, no acute ischemic features on EKG, and no acute findings on CT dissection study  - Cardiology was consulted by ED physician, will follow-up on recommendations  - Patient reports sxs to be similar to a prior NSTEMI but also complains of tenderness when auscultating over her chest  - Continue cardiac monitoring, repeat troponin and EKG, continue BP-control, statin, aspirin, ARB    2. Hypertensive crisis  - BP as high as 229/129 in ED without headache or neuro deficits, no acute findings on dissection study, and normalized after IV labetalol and hydralazine in ED  - Patient reports labile pressures at home  - Continue losartan, use labetalol as needed   3. Anxiety  - She is anxious in ED, continue as-needed Ativan    4. Hypothyroidism  - TSH 6.815 on admission  - Check free T4, continue Synthroid    5. COPD  - Appears to be stable, continue albuterol as needed    6.  Incidental CT findings  - Persistent dilation of pancreatic duct noted on CT in ED, papillary mucinous neoplasm is a consideration, may need EUS with FNA; discussed with patient, she had been planning to go back to her GI physician for epigastric pain and constipation  - Stable type B aortic dissection vs non-calcified thrombus noted on CTA in ED, discussed with patient, may need vascular surgery to weigh in     DVT prophylaxis: Lovenox  Code Status: DNR  Family Communication: Discussed with patient  Disposition Plan:  Patient is from: home  Anticipated d/c is to: Home  Anticipated d/c date is: 11/04/19 Patient currently: Pending BP control, cardiology consultation  Consults called: Cardiology consulted by ED physician  Admission status: Observation     Briscoe Deutscherimothy S Delesha Pohlman, MD [facility]  11/03/2019, 11:10 PM

## 2019-11-03 NOTE — Consult Note (Signed)
Cardiology Consultation:   Patient ID: Leah Pittman MRN: [id number]; DOB: Dec 07, 1930  Admit date: 11/03/2019 Date of Consult: 11/03/2019  Primary Care Provider: Blane Ohara, MD [facility] Cardiologist: Thurmon Fair, MD  [facility] Electrophysiologist:  None    Patient Profile:   Leah Pittman is a 86 y.o. female with a hx of CAD s/p CABG, glaucoma, HLD, HTN and hypothyroidism who is being seen today for the evaluation of chest pain at the request of the ER.  History of Present Illness:   Leah Pittman has had intermittent chest pain over the past week. She states that the pain is sometimes a pressure across her chest, other times a soreness in her chest that is reproducible to touch and other times a discomfort in her upper abdomen. She states that the pain is sometimes similar to before she had her CABG. She has also had worsening of her vision and troubles with pains in her legs when she rests. She has been checking her blood pressures at home and only takes her blood pressure medications if her systolic pressure is >168mmHg on her home cuff. On arrival to the ER, her systolic pressures were >230mmHg. She notes ongoing chest discomfort but feels it is mostly in her upper abdomen at this time. She endorses some shortness of breath. She denies any lower leg swelling. She has continued to be active and does not feel that her pain has worsened with activity.    Past Medical History:  Diagnosis Date   Anxiety    Arthritis    Chest pain 2011   CARDIOLITE - no significant symptoms, EKG changes, or arrhythmias   Dyspnea 02/16/2012   2D ECHO - EF 55-60%, normal   Fatigue    Glaucoma    Headache(784.0)    Hiatal hernia    High blood pressure 02/16/2012   RENAL DOPPLER - normal   Hyperlipidemia    Hypothyroidism    Labile hypertension    Lightheadedness    Migraine headache    Multiple allergies    Myalgia    Pain, lower leg    Calf   Problem of  menstruation    Reflux    SOB (shortness of breath)    Thyroid disease    Urinary problem     Past Surgical History:  Procedure Laterality Date   ABDOMINAL HYSTERECTOMY  1976   CHOLECYSTECTOMY  2000   CORONARY ARTERY BYPASS GRAFT N/A 07/10/2018   Procedure: CORONARY ARTERY BYPASS GRAFTING (CABG), FREE LIMA;  Surgeon: [name], MD;  Location: [facility] OR;  Service: Open Heart Surgery;  Laterality: N/A;   CYSTECTOMY  1974   Intestine   CYSTECTOMY  1996   Brain stem   EYE SURGERY  2003   LEFT HEART CATH AND CORONARY ANGIOGRAPHY N/A 07/08/2018   Procedure: LEFT HEART CATH AND CORONARY ANGIOGRAPHY;  Surgeon: Runell Gess, MD;  Location: [facility] INVASIVE CV LAB;  Service: Cardiovascular;  Laterality: N/A;   LEFT HEART CATH AND CORONARY ANGIOGRAPHY N/A 07/25/2019   Procedure: LEFT HEART CATH AND CORONARY ANGIOGRAPHY;  Surgeon: Kathleene Hazel, MD;  Location: [facility] INVASIVE CV LAB;  Service: Cardiovascular;  Laterality: N/A;   TEE WITHOUT CARDIOVERSION N/A 07/10/2018   Procedure: TRANSESOPHAGEAL ECHOCARDIOGRAM (TEE);  Surgeon: [name], MD;  Location: [facility] OR;  Service: Open Heart Surgery;  Laterality: N/A;     Home Medications:  Prior to Admission medications   Medication Sig Start Date End Date Taking? Authorizing Provider  acetaminophen (  TYLENOL) 325 MG tablet Take 975 mg by mouth every 8 (eight) hours as needed for mild pain or headache.  09/29/14  Yes [provider]  aspirin 81 MG tablet Take 81 mg by mouth daily.   Yes [provider]  Calcium Citrate-Vitamin D (CALCIUM CITRATE+D3 PETITES PO) Take 1 tablet by mouth daily.   Yes [provider]  dorzolamide-timolol (COSOPT) 22.3-6.8 MG/ML ophthalmic solution Place 1 drop into the left eye 2 (two) times daily.  06/05/12  Yes [provider]  latanoprost (XALATAN) 0.005 % ophthalmic solution Place 1 drop into both eyes at bedtime.  07/23/19 07/22/20 Yes [provider]    levothyroxine (SYNTHROID) 100 MCG tablet TAKE 1 TABLET BY MOUTH EVERY DAY Patient taking differently: Take 100 mcg by mouth daily before breakfast.  10/08/19  Yes Marianne Sofia, PA-C  LORazepam (ATIVAN) 1 MG tablet TAKE 1 TABLET(1 MG) BY MOUTH TWICE DAILY AS NEEDED FOR ANXIETY Patient taking differently: Take 0.5 mg by mouth 2 (two) times daily as needed for anxiety.  09/19/19  Yes [name], [name], MD  losartan (COZAAR) 25 MG tablet TAKE 1 TABLET(25 MG) BY MOUTH 1 TIME PER DAY Patient taking differently: Take 25 mg by mouth daily.  05/01/19  Yes [name], [name], MD  magnesium oxide (MAG-OX) 400 MG tablet Take 400 mg by mouth daily. Chewable   Yes [provider]  nitroGLYCERIN (NITROSTAT) 0.4 MG SL tablet Place 0.4 mg under the tongue every 5 (five) minutes as needed for chest pain.  05/10/18  Yes [provider]  ondansetron (ZOFRAN) 8 MG tablet Take 1 tablet (8 mg total) by mouth 2 (two) times daily as needed for nausea or vomiting. 07/17/19  Yes [name], [name], MD  pantoprazole (PROTONIX) 40 MG tablet Take 1 tablet (40 mg total) by mouth daily. 07/17/19  Yes [name], [name], MD  simvastatin (ZOCOR) 20 MG tablet TAKE 1 TABLET BY MOUTH EVERYDAY AT BEDTIME Patient taking differently: Take 20 mg by mouth at bedtime.  08/05/19  Yes [name], [name], MD  Vitamin D, Cholecalciferol, 50 MCG (2000 UT) CAPS Take 1 tablet by mouth daily.   Yes [provider]  Probiotic Product (PROBIOTIC DAILY PO) Take 1 capsule by mouth daily.    [provider]  ranolazine (RANEXA) 500 MG 12 hr tablet TAKE 1 TABLET BY MOUTH TWICE A DAY Patient not taking: Reported on 11/03/2019 08/21/19   Marianne Sofia, PA-C    Inpatient Medications: Scheduled Meds:  [START ON 11/04/2019] aspirin EC  81 mg Oral Daily   dorzolamide-timolol  1 drop Left Eye BID   enoxaparin (LOVENOX) injection  40 mg Subcutaneous QHS   latanoprost  1 drop Both Eyes QHS   [START ON 11/04/2019] levothyroxine  100 mcg Oral QAC breakfast    [START ON 11/04/2019] losartan  25 mg Oral Daily   [START ON 11/04/2019] pantoprazole  40 mg Oral Daily   simvastatin  20 mg Oral QHS   sodium chloride flush  3 mL Intravenous Q12H   sodium chloride flush  3 mL Intravenous Q12H   Continuous Infusions:  sodium chloride     PRN Meds: sodium chloride, acetaminophen **OR** acetaminophen, albuterol, bisacodyl, fentaNYL (SUBLIMAZE) injection, guaiFENesin-dextromethorphan, labetalol, LORazepam, ondansetron **OR** ondansetron (ZOFRAN) IV, senna-docusate, sodium chloride flush  Allergies:    Allergies  Allergen Reactions   Benadryl [Diphenhydramine] Swelling and Other (See Comments)    Tongue swells and hallucinates- could not talk   Codeine Nausea And Vomiting and Hypertension   Meperidine Nausea Only,  Swelling and Other (See Comments)    Tongue swells and "I feel like death"   Prednisone     Hypertension    Promethazine Other (See Comments)    Hypotension    Propranolol Nausea And Vomiting   Shellfish Allergy Nausea And Vomiting   Tape Other (See Comments)    Tears the skin   Tazarotene Other (See Comments)    Reaction not recalled    Iodinated Diagnostic Agents Nausea Only and Rash    Social History:   Social History   Socioeconomic History   Marital status: Married    Spouse name: Not on file   Number of children: 3   Years of education: college   Highest education level: Not on file  Occupational History   Occupation: Retired  Tobacco Use   Smoking status: Never Smoker   Smokeless tobacco: Never Used  Substance and Sexual Activity   Alcohol use: No   Drug use: No   Sexual activity: Not on file  Other Topics Concern   Not on file  Social History Narrative   Lives with husband in [place], [place]   Social Determinants of Health   Financial Resource Strain:    Difficulty of Paying Living Expenses: Not on file  Food Insecurity:    Worried About Programme researcher, broadcasting/film/video in the Last Year: Not on  file   [employer] of Food in the Last Year: Not on file  Transportation Needs:    Lack of Transportation (Medical): Not on file   Lack of Transportation (Non-Medical): Not on file  Physical Activity:    Days of Exercise per Week: Not on file   Minutes of Exercise per Session: Not on file  Stress:    Feeling of Stress : Not on file  Social Connections:    Frequency of Communication with Friends and Family: Not on file   Frequency of Social Gatherings with Friends and Family: Not on file   Attends Religious Services: Not on file   Active Member of Clubs or Organizations: Not on file   Attends Banker Meetings: Not on file   Marital Status: Not on file  Intimate Partner Violence:    Fear of Current or Ex-Partner: Not on file   Emotionally Abused: Not on file   Physically Abused: Not on file   Sexually Abused: Not on file    Family History:    Family History  Problem Relation Age of Onset   Heart attack Father    Stroke Father    Cancer Sister    Cancer Brother    Asthma Brother    Cancer Brother      ROS:  Please see the history of present illness.  All other ROS reviewed and negative.     Physical Exam/Data:   Vitals:   11/03/19 1856 11/03/19 2000 11/03/19 2148 11/03/19 2300  BP: (!) 177/65 (!) 165/53 (!) 158/57 (!) 138/58  Pulse: 65 63 67 66  Resp: (!) 27  Temp:      TempSrc:      SpO2: 99% 99% 98% 98%  Weight:      Height:        Intake/Output Summary (Last 24 hours) at 11/03/2019 2316 Last data filed at 11/03/2019 2313 Gross per 24 hour  Intake 3 ml  Output --  Net 3 ml   Last 3 Weights 11/03/2019 08/20/2019 08/06/2019  Weight (lbs) 118 lb 119 lb 12.8 oz 120 lb  Weight (kg)  53.524 kg 54.341 kg 54.432 kg     Body mass index is 20.25 kg/m.  General:  Elderly woman, lying comfortably in bed  HEENT: normal Neck: no JVD Cardiac:  normal S1, S2; RRR; no murmurs Lungs:  clear to auscultation bilaterally, no wheezing,  rhonchi or rales  Abd: soft, nontender, no hepatomegaly  Ext: no edema Musculoskeletal:  No deformities, BUE and BLE strength normal and equal Skin: warm and dry  Neuro:  CNs 2-12 intact, no focal abnormalities noted Psych:  Normal affect   EKG:  The EKG was personally reviewed and demonstrates:  NSR with no significant ST segment changes  Relevant CV Studies: Echo 07/25/19 1. Abnormal septal motion inferior basal hypokinesis . Left ventricular  ejection fraction, by estimation, is 50 to 55%. The left ventricle has low  normal function. The left ventricle demonstrates regional wall motion  abnormalities (see scoring  diagram/findings for description). Left ventricular diastolic parameters  are consistent with age-related delayed relaxation (normal).  2. Right ventricular systolic function is normal. The right ventricular  size is normal.  3. The mitral valve is normal in structure. Trivial mitral valve  regurgitation. No evidence of mitral stenosis.  4. The aortic valve is tricuspid. Aortic valve regurgitation is trivial.  Mild aortic valve sclerosis is present, with no evidence of aortic valve  stenosis.  5. The inferior vena cava is normal in size with greater than 50%  respiratory variability, suggesting right atrial pressure of 3 mmHg.  LHC 07/25/19  Prox RCA to Mid RCA lesion is 40% stenosed.  Ost LM to Mid LM lesion is 60% stenosed.  Mid Cx lesion is 99% stenosed.  SVG graft was visualized by angiography and is normal in caliber.  The graft exhibits no disease.  SVG graft was visualized by angiography and is normal in caliber.  The graft exhibits no disease.  Mid LAD lesion is 70% stenosed.   Severe double vessel CAD Moderately severe ostial left main stenosis Severe stenosis mid LAD. The SVG to the LAD is patent. The LIMA does not appear to have been used despite the findings in the operative report. The LIMA is small and atretic.  Severe stenosis mid  Circumflex. OM and OM2 fill from the patent vein graft.  Mild disease mid RCA  Recommendations: Continue medical management of CAD. Will start lactulose for constipation. She could be discharged later today after bedrest if she is stable.    Laboratory Data:  High Sensitivity Troponin:   Recent Labs  Lab 11/03/19 1252 11/03/19 1528  TROPONINIHS 5 7     Chemistry Recent Labs  Lab 11/03/19 1252  NA 139  K 4.2  CL 106  CO2 24  GLUCOSE 96  BUN 14  CREATININE 0.91  CALCIUM 9.9  GFRNONAA 57*  GFRAA >60  ANIONGAP 9    Recent Labs  Lab 11/03/19 1528  PROT 7.0  ALBUMIN 4.2  AST 42*  ALT 21  ALKPHOS 53  BILITOT 1.0   Hematology Recent Labs  Lab 11/03/19 1252  WBC 6.9  RBC 4.11  HGB 12.6  HCT 39.3  MCV 95.6  MCH 30.7  MCHC 32.1  RDW 14.4  PLT 218   BNP Recent Labs  Lab 11/03/19 1528  BNP 149.8*    DDimer No results for input(s): DDIMER in the last 168 hours.   Radiology/Studies:  DG Chest 2 View  Result Date: 11/03/2019 CLINICAL DATA:  Chest pain. EXAM: CHEST - 2 VIEW COMPARISON:  Chest x-ray dated July 24, 2019. FINDINGS: The heart size and mediastinal contours are within normal limits. Prior CABG. Normal pulmonary vascularity. No focal consolidation, pleural effusion, or pneumothorax. No acute osseous abnormality. IMPRESSION: No active cardiopulmonary disease. Electronically Signed   By: Obie DredgeWilliam T Derry M.D.   On: 11/03/2019 13:27   CT Angio Chest/Abd/Pel for Dissection W and/or Wo Contrast  Result Date: 11/03/2019 CLINICAL DATA:  Chest pain and back pain, aortic dissection suspected. Blood pressure over 225. EXAM: CT ANGIOGRAPHY CHEST, ABDOMEN AND PELVIS TECHNIQUE: Non-contrast CT of the chest was initially obtained. Multidetector CT imaging through the chest, abdomen and pelvis was performed using the standard protocol during bolus administration of intravenous contrast. Multiplanar reconstructed images and MIPs were obtained and reviewed to evaluate  the vascular anatomy. "Pt allergic to contrast- had 4 hour prep ( but is allergic to benedryl and prednisone so used alternate meds.)" CONTRAST:  100mL OMNIPAQUE IOHEXOL 350 MG/ML SOLN COMPARISON:  CT coronary 05/06/2018, CT angio chest 04/22/2019, CT abdomen pelvis 07/13/2017, CT angio chest 01/30/2019. FINDINGS: CTA CHEST FINDINGS Cardiovascular: Preferential opacification of the thoracic aorta. Similar-appearing crescentic peripheral hypodensity along the aortic arch that originates just distal to left subclavian artery (10:55-57, 11:79-82) and extends to the origin of the descending thoracic aorta. This finding is present on prior CT chest 04/22/2019 and 01/30/2019; however, is very poorly visualized and evaluated on the prior studies due to non-optimization for the visualization of the aorta on these prior studies. No evidence of thoracic aortic aneurysm. No periaortic fat stranding. Normal heart size. No significant pericardial effusion. The main pulmonary artery is normal in caliber. No central or segmental pulmonary embolus. At least 4 vessel mild coronary artery calcifications in a patient status post coronary artery bypass graft. Mediastinum/Nodes: Calcified right hilar lymph node (7:78). Prominent but nonenlarged left hilar lymph node (7:70). No enlarged mediastinal, hilar, or axillary lymph nodes. Thyroid gland, trachea, and esophagus demonstrate no significant findings. Lungs/Pleura: Few scattered calcified granuloma. Biapical pleural/pulmonary scarring. Few scattered pulmonary micro nodules. Right basilar 5 mm pulmonary nodule (7:129). Left basilar 4 mm pulmonary nodule (7:125). No pulmonary mass. No focal consolidation. No pleural effusion or pneumothorax. Musculoskeletal: No chest wall abnormality. No acute or significant osseous findings. No acute displaced fracture. Review of the MIP images confirms the above findings. CTA ABDOMEN AND PELVIS FINDINGS VASCULAR Aorta: Severe calcified and  noncalcified atherosclerotic plaque. Normal caliber aorta without aneurysm, dissection, vasculitis or significant stenosis. No periaortic fat stranding. Celiac: Patent without evidence of aneurysm, dissection, vasculitis or significant stenosis. SMA: Patent without evidence of aneurysm, dissection, vasculitis or significant stenosis. Replaced right hepatic artery originating off of the superior mesenteric artery. Renals: The right renal artery is diminutive in size compared to the left. Both renal arteries are patent without evidence of aneurysm, dissection, vasculitis, fibromuscular dysplasia or significant stenosis. IMA: Patent without evidence of aneurysm, dissection, vasculitis or significant stenosis. Iliacs: Mild to moderate calcified and noncalcified atherosclerotic plaque. Patent without evidence of aneurysm, dissection, vasculitis or significant stenosis. Veins: No obvious venous abnormality within the limitations of this arterial phase study. Other: Mild soft tissue density along the right femoral vessels likely scarring for prior access. No aneurysm identified at this level. Review of the MIP images confirms the above findings. NON-VASCULAR Hepatobiliary: No focal liver abnormality is seen. Hypodense appearance of the hepatic parenchyma mid to the splenic parenchyma likely due to timing of contrast. Status post cholecystectomy. No biliary dilatation. Pancreas: Diffusely atrophic. Persistent main pancreatic duct dilatation within the head and neck of the  pancreas measuring up to 5 mm. Spleen: Normal in size without focal abnormality. Adrenals/Urinary Tract: Adrenal glands are unremarkable. Kidneys are normal, without renal calculi, focal lesion, or hydronephrosis. Urinary bladder is unremarkable. Stomach/Bowel: Stomach is within normal limits. The appendix is not definitely identified. No evidence of bowel wall thickening, distention, or inflammatory changes. Diffuse sigmoid diverticulosis. Lymphatic: No  abdominal, pelvic, inguinal lymphadenopathy. Reproductive: Status post hysterectomy. No adnexal masses. Other: No abdominal wall hernia or abnormality. No abdominopelvic ascites. No free intraperitoneal gas. No organized fluid collection. Musculoskeletal: No acute or significant osseous findings. Review of the MIP images confirms the above findings. IMPRESSION: 1. Similar-appearing peripheral crescentic hypodensity along the aortic arch that originates just distal to the left subclavian artery and terminates at the origin of the descending thoracic aorta that may represent a stable type B aortic dissection versus noncalcified thrombus in a patient with severe calcified and noncalcified atherosclerotic plaque of the aorta and its branches. No aorta aneurysm or intramural hematoma. 2. Persistent main pancreatic duct dilatation up to 5 mm within the head and neck of the pancreas. Findings may represent intraductal papillary mucinous neoplasm. 3. Couple of bibasilar pulmonary nodules measuring up to 5 mm in a patient demonstrating sequela of prior granulomatous disease. These results were called by telephone at the time of interpretation on 11/03/2019 at 9:20 pm to provider Dr. Jacqulyn Bath, who verbally acknowledged these results. Electronically Signed   By: Tish Frederickson M.D.   On: 11/03/2019 21:18   { HEAR Score (for undifferentiated chest pain):  4 (for age and risk factors)     Assessment and Plan:   1. Chest pain- She presents with chest pain that has been off and on for the past week, similar to prior episodes. She was admitted for a similar presentation in June and work-up at that time, including a repeat echo and LHC, was unremarkable. She was quite hypertensive on admission and I am concerned that this could be driving most of her symptoms. Fortunately, her ECG does not show any evidence of her ischemia and her serial troponins are negative. Recommend admission to medicine for better blood pressure control  and work-up of her other ailments.  - Consider admission to medicine for better blood pressure control - No further cardiac work-up indicated - Recommend correlating home BP cuff with in-hospital readings       For questions or updates, please contact [facility] Please consult [email or web address] for contact info under    Signed, Nicoletta Ba, MD  11/03/2019 11:16 PM

## 2019-11-03 NOTE — ED Provider Notes (Signed)
Blood pressure (!) 175/55, pulse 65, temperature 98.1 F (36.7 C), temperature source Oral, resp. rate 18, height 5\' 4"  (1.626 m), weight 53.5 kg, SpO2 97 %.  Assuming care from Dr. .  In short, Leah Pittman is a 86 y.o. female with a chief complaint of Chest Pain .  Refer to the original H&P for additional details.  The current plan of care is to CTA and reassess.   EKG Interpretation  Date/Time:  Monday November 03 2019 12:48:19 EDT Ventricular Rate:  68 PR Interval:  140 QRS Duration: 64 QT Interval:  392 QTC Calculation: 416 R Axis:   78 Text Interpretation: Normal sinus rhythm Nonspecific ST abnormality Abnormal ECG When compared to prior, no significant cahnges seen. No STEMI Confirmed by [id number] (Theda Belfast) on 11/03/2019 1:43:52 PM      09:30 PM  Discussed CTA with Radiology. Findings in aortic arch is old and seen in prior studies and of questionable significance. Will touch base with Cardiology with CP in the last week feeling similar to prior CAD. Patient's BP remains elevated but improved from initial values. Plan for additional labetalol. Patient is not very compliant with BP meds by her own report. She describes labile BPs at home and holds many BP meds in this setting. Troponin is reassuring.    Spoke with Cards Fellow on call. No plan for left heart cath with recent studies in June. Can control BP and trend troponin. Question HTN urgency. No evidence of end-organ damage at this time.   Discussed patient's case with [facility], Dr. July to request admission. Patient and family (if present) updated with plan. Care transferred to [facility] service.  I reviewed all nursing notes, vitals, pertinent old records, EKGs, labs, imaging (as available).    [facility], MD 11/03/19 2233

## 2019-11-04 DIAGNOSIS — I700 Atherosclerosis of aorta: Secondary | ICD-10-CM

## 2019-11-04 DIAGNOSIS — I169 Hypertensive crisis, unspecified: Principal | ICD-10-CM

## 2019-11-04 DIAGNOSIS — R109 Unspecified abdominal pain: Secondary | ICD-10-CM

## 2019-11-04 DIAGNOSIS — R030 Elevated blood-pressure reading, without diagnosis of hypertension: Secondary | ICD-10-CM | POA: Diagnosis not present

## 2019-11-04 DIAGNOSIS — I2510 Atherosclerotic heart disease of native coronary artery without angina pectoris: Secondary | ICD-10-CM | POA: Diagnosis not present

## 2019-11-04 DIAGNOSIS — R140 Abdominal distension (gaseous): Secondary | ICD-10-CM

## 2019-11-04 DIAGNOSIS — K8689 Other specified diseases of pancreas: Secondary | ICD-10-CM | POA: Diagnosis not present

## 2019-11-04 DIAGNOSIS — E782 Mixed hyperlipidemia: Secondary | ICD-10-CM

## 2019-11-04 DIAGNOSIS — I7101 Dissection of thoracic aorta: Secondary | ICD-10-CM

## 2019-11-04 DIAGNOSIS — R079 Chest pain, unspecified: Secondary | ICD-10-CM | POA: Diagnosis not present

## 2019-11-04 LAB — HEPATIC FUNCTION PANEL
ALT: 19 U/L (ref 0–44)
AST: 21 U/L (ref 15–41)
Albumin: 3.6 g/dL (ref 3.5–5.0)
Alkaline Phosphatase: 52 U/L (ref 38–126)
Bilirubin, Direct: <0.1 mg/dL (ref 0.0–0.2)
Total Bilirubin: 0.3 mg/dL (ref 0.3–1.2)
Total Protein: 6.4 g/dL — ABNORMAL LOW (ref 6.5–8.1)

## 2019-11-04 LAB — BASIC METABOLIC PANEL
Anion gap: 10 (ref 5–15)
BUN: 14 mg/dL (ref 8–23)
CO2: 23 mmol/L (ref 22–32)
Calcium: 9.8 mg/dL (ref 8.9–10.3)
Chloride: 105 mmol/L (ref 98–111)
Creatinine, Ser: 0.8 mg/dL (ref 0.44–1.00)
GFR calc Af Amer: >60 mL/min (ref >60)
GFR calc non Af Amer: >60 mL/min (ref >60)
Glucose, Bld: 113 mg/dL — ABNORMAL HIGH (ref 70–99)
Potassium: 3.6 mmol/L (ref 3.5–5.1)
Sodium: 138 mmol/L (ref 135–145)

## 2019-11-04 LAB — CBC
HCT: 36.9 % (ref 36.0–46.0)
Hemoglobin: 12.4 g/dL (ref 12.0–15.0)
MCH: 31.1 pg (ref 26.0–34.0)
MCHC: 33.6 g/dL (ref 30.0–36.0)
MCV: 92.5 fL (ref 80.0–100.0)
Platelets: 205 10*3/uL (ref 150–400)
RBC: 3.99 MIL/uL (ref 3.87–5.11)
RDW: 14.4 % (ref 11.5–15.5)
WBC: 8 10*3/uL (ref 4.0–10.5)
nRBC: 0 % (ref 0.0–0.2)

## 2019-11-04 LAB — TROPONIN I (HIGH SENSITIVITY)
Troponin I (High Sensitivity): 6 ng/L (ref <18)
Troponin I (High Sensitivity): 7 ng/L (ref <18)

## 2019-11-04 LAB — LIPID PANEL
Cholesterol: 206 mg/dL — ABNORMAL HIGH (ref 0–200)
HDL: 69 mg/dL (ref >40)
LDL Cholesterol: 120 mg/dL — ABNORMAL HIGH (ref 0–99)
Total CHOL/HDL Ratio: 3 RATIO
Triglycerides: 86 mg/dL (ref <150)
VLDL: 17 mg/dL (ref 0–40)

## 2019-11-04 LAB — MAGNESIUM: Magnesium: 2 mg/dL (ref 1.7–2.4)

## 2019-11-04 LAB — LIPASE, BLOOD: Lipase: 32 U/L (ref 11–51)

## 2019-11-04 LAB — T4, FREE: Free T4: 1.14 ng/dL — ABNORMAL HIGH (ref 0.61–1.12)

## 2019-11-04 MED ORDER — LINACLOTIDE 145 MCG PO CAPS
145.0000 ug | ORAL_CAPSULE | Freq: Every day | ORAL | Status: DC
  Administered 2019-11-05 – 2019-11-07 (×3): 145 ug via ORAL
  Filled 2019-11-04 (×5): qty 1

## 2019-11-04 MED ORDER — SODIUM CHLORIDE 0.9 % IV SOLN: INTRAVENOUS | Status: DC

## 2019-11-04 NOTE — Consult Note (Signed)
[facility] Consult: 12:37 PM 11/04/2019  LOS: 0 days    Referring Provider: Dr Royann Shiversroitoru  Primary Care Physician:  Blane Oharaox, [name], MD Primary Gastroenterologist:  Dr Prudencio Burlyri Li in [facility].  Before that, Dr Chales AbrahamsGupta in [place].        Reason for Consultation:  Epigastric pain.   HPI: Leah Pittman is a 86 y.o. female.  Legally blind from glaucoma.  Htn.  Anxiety.  OSA.  Hypothyroidism.  HLD.  Stable RLL pulm nodule.  Headaches.  CABG 06/2018.  1975 hysterectomy.  Cholecystectomy.  2003 bladder suspension.   Cardiac cath 07/2018, 1 month post GABG: patent grafts.    2016 Colonoscopy, for abd pain, constipation: sigmoid diverticulosis.   11/2016 EGD for dysphagia, GERD, epig pain, nausea: Schatzki's ring s/p dilation, small HH, and gastritis. 06/2017 CTAP with sigmoid diverticulitis and mild pancreatic atrophy.    Chronic abd pain and nausea as of 06/2017 GI OV note.  Daily PPI.   Sigmoid Diverticulitis in 04/2017.  Constipation, unable to afford Rx'd Linzess, Amitiza.    Lives with epigastric distress, nausea without emesis date back to at least 2003 per review of Epic and radiology records, intermittent occurrences.  Intermittent bilateral chest discomfort, worse with twisting and movement ever since 06/2018 CABG.  Having persistent epigastric distress, nausea without emesis as well as bilateral chest discomfort for 3 or 4 weeks now.  Symptoms worse when she lays on her left side.  Symptoms are present at rest, possibly worse with exertion.   In terms of constipation she has intermittent spells of this that are managed either with Colace or MiraLAX.  In addition she complains of numbness and burning pain in her legs/feet, hoarseness in her voice, fatigue.  She used to walk for exercise on a regular basis and does not  have the energy to do this anymore.  She denies anxiety.  She used to take lorazepam at night but weaned off this about a month ago at the suggestion of her cardiologist.  Denies feeling tearful or depressed. Regular GI meds include Protonix 40 mg daily in AM.  MiraLAX and Colace prn.  Zofran prn, does help.   Cardiologist asked for GI input into above sxs and abnormal CT. CTAP chest w angio showing atrophic pancreas, persistent 5 mm dilation of PD at region head/neck, ?intraductal papillary mucinous neoplasm.  Also shows ? Stable type B aortic dissection vs noncalcified thrombus in region of L subclavian to descending thoracic aorta.  BB pulm nodules c/w prior granulomatous dz. Severe vasc dz om aorta and branch vessels.  Celiac, AMA, IMA all patent and no vascular dz.     LFTs normal.  Lipase 65 >> 32.        Past Medical History:  Diagnosis Date  . Anxiety   . Arthritis   . Chest pain 2011   CARDIOLITE - no significant symptoms, EKG changes, or arrhythmias  . Dyspnea 02/16/2012   2D ECHO - EF 55-60%, normal  . Fatigue   . Glaucoma   . Headache(784.0)   . Hiatal  hernia   . High blood pressure 02/16/2012   RENAL DOPPLER - normal  . Hyperlipidemia   . Hypothyroidism   . Labile hypertension   . Lightheadedness   . Migraine headache   . Multiple allergies   . Myalgia   . Pain, lower leg    Calf  . Problem of menstruation   . Reflux   . SOB (shortness of breath)   . Thyroid disease   . Urinary problem     Past Surgical History:  Procedure Laterality Date  . ABDOMINAL HYSTERECTOMY  1976  . CHOLECYSTECTOMY  2000  . CORONARY ARTERY BYPASS GRAFT N/A 07/10/2018   Procedure: CORONARY ARTERY BYPASS GRAFTING (CABG), FREE LIMA;  Surgeon: [name], MD;  Location: [facility] OR;  Service: Open Heart Surgery;  Laterality: N/A;  . CYSTECTOMY  1974   Intestine  . CYSTECTOMY  1996   Brain stem  . EYE SURGERY  2003  . LEFT HEART CATH AND CORONARY ANGIOGRAPHY N/A 07/08/2018   Procedure:  LEFT HEART CATH AND CORONARY ANGIOGRAPHY;  Surgeon: Runell Gess, MD;  Location: [facility] INVASIVE CV LAB;  Service: Cardiovascular;  Laterality: N/A;  . LEFT HEART CATH AND CORONARY ANGIOGRAPHY N/A 07/25/2019   Procedure: LEFT HEART CATH AND CORONARY ANGIOGRAPHY;  Surgeon: Kathleene Hazel, MD;  Location: [facility] INVASIVE CV LAB;  Service: Cardiovascular;  Laterality: N/A;  . TEE WITHOUT CARDIOVERSION N/A 07/10/2018   Procedure: TRANSESOPHAGEAL ECHOCARDIOGRAM (TEE);  Surgeon: [name], MD;  Location[facility];  Service: Open Heart Surgery;  Laterality: N/A;    Prior to Admission medications   Medication Sig Start Date End Date Taking? Authorizing Provider  acetaminophen (TYLENOL) 325 MG tablet Take 975 mg by mouth every 8 (eight) hours as needed for mild pain or headache.  09/29/14  Yes [provider]  aspirin 81 MG tablet Take 81 mg by mouth daily.   Yes [provider]  Calcium Citrate-Vitamin D (CALCIUM CITRATE+D3 PETITES PO) Take 1 tablet by mouth daily.   Yes [provider]  dorzolamide-timolol (COSOPT) 22.3-6.8 MG/ML ophthalmic solution Place 1 drop into the left eye 2 (two) times daily.  06/05/12  Yes [provider]  latanoprost (XALATAN) 0.005 % ophthalmic solution Place 1 drop into both eyes at bedtime.  07/23/19 07/22/20 Yes [provider]  levothyroxine (SYNTHROID) 100 MCG tablet TAKE 1 TABLET BY MOUTH EVERY DAY Patient taking differently: Take 100 mcg by mouth daily before breakfast.  10/08/19  Yes Marianne Sofia, PA-C  LORazepam (ATIVAN) 1 MG tablet TAKE 1 TABLET(1 MG) BY MOUTH TWICE DAILY AS NEEDED FOR ANXIETY Patient taking differently: Take 0.5 mg by mouth 2 (two) times daily as needed for anxiety.  09/19/19  Yes [name], [name], MD  losartan (COZAAR) 25 MG tablet TAKE 1 TABLET(25 MG) BY MOUTH 1 TIME PER DAY Patient taking differently: Take 25 mg by mouth daily.  05/01/19  Yes [name], [name], MD  magnesium oxide (MAG-OX) 400 MG tablet Take 400 mg  by mouth daily. Chewable   Yes [provider]  nitroGLYCERIN (NITROSTAT) 0.4 MG SL tablet Place 0.4 mg under the tongue every 5 (five) minutes as needed for chest pain.  05/10/18  Yes [provider]  ondansetron (ZOFRAN) 8 MG tablet Take 1 tablet (8 mg total) by mouth 2 (two) times daily as needed for nausea or vomiting. 07/17/19  Yes [name], [name], MD  pantoprazole (PROTONIX) 40 MG tablet Take 1 tablet (40 mg total) by mouth daily. 07/17/19  Yes  [name], [name], MD  simvastatin (ZOCOR) 20 MG tablet TAKE 1 TABLET BY MOUTH EVERYDAY AT BEDTIME Patient taking differently: Take 20 mg by mouth at bedtime.  08/05/19  Yes [name], [name], MD  Vitamin D, Cholecalciferol, 50 MCG (2000 UT) CAPS Take 1 tablet by mouth daily.   Yes [provider]  Probiotic Product (PROBIOTIC DAILY PO) Take 1 capsule by mouth daily.    [provider]  ranolazine (RANEXA) 500 MG 12 hr tablet TAKE 1 TABLET BY MOUTH TWICE A DAY Patient not taking: Reported on 11/03/2019 08/21/19   Marianne Sofia, PA-C    Scheduled Meds: . aspirin EC  81 mg Oral Daily  . dorzolamide-timolol  1 drop Left Eye BID  . enoxaparin (LOVENOX) injection  40 mg Subcutaneous QHS  . latanoprost  1 drop Both Eyes QHS  . levothyroxine  100 mcg Oral QAC breakfast  . losartan  25 mg Oral Daily  . pantoprazole  40 mg Oral Daily  . simvastatin  20 mg Oral QHS  . sodium chloride flush  3 mL Intravenous Q12H  . sodium chloride flush  3 mL Intravenous Q12H   Infusions: . sodium chloride    . sodium chloride 75 mL/hr at 11/04/19 1147   PRN Meds: sodium chloride, acetaminophen **OR** acetaminophen, albuterol, bisacodyl, fentaNYL (SUBLIMAZE) injection, guaiFENesin-dextromethorphan, labetalol, LORazepam, ondansetron **OR** ondansetron (ZOFRAN) IV, senna-docusate, sodium chloride flush   Allergies as of 11/03/2019 - Review Complete 11/03/2019  Allergen Reaction Noted  . Benadryl [diphenhydramine] Swelling and Other (See Comments)  09/27/2017  . Codeine Nausea And Vomiting and Hypertension 11/13/2010  . Meperidine Nausea Only, Swelling, and Other (See Comments) 09/26/2012  . Prednisone  07/01/2018  . Promethazine Other (See Comments) 09/26/2012  . Propranolol Nausea And Vomiting 09/26/2012  . Shellfish allergy Nausea And Vomiting 07/17/2012  . Tape Other (See Comments) 05/10/2018  . Tazarotene Other (See Comments) 09/26/2012  . Iodinated diagnostic agents Nausea Only and Rash 09/26/2012    Family History  Problem Relation Age of Onset  . Heart attack Father   . Stroke Father   . Cancer Sister   . Cancer Brother   . Asthma Brother   . Cancer Brother     Social History   Socioeconomic History  . Marital status: Married    Spouse name: Not on file  . Number of children: 3  . Years of education: college  . Highest education level: Not on file  Occupational History  . Occupation: Retired  Tobacco Use  . Smoking status: Never Smoker  . Smokeless tobacco: Never Used  Substance and Sexual Activity  . Alcohol use: No  . Drug use: No  . Sexual activity: Not on file  Other Topics Concern  . Not on file  Social History Narrative   Lives with husband in [place], [place]   Social Determinants of Health   Financial Resource Strain:   . Difficulty of Paying Living Expenses: Not on file  Food Insecurity:   . Worried About Programme researcher, broadcasting/film/video in the Last Year: Not on file  . Ran Out of Food in the Last Year: Not on file  Transportation Needs:   . Lack of Transportation (Medical): Not on file  . Lack of Transportation (Non-Medical): Not on file  Physical Activity:   . Days of Exercise per Week: Not on file  . Minutes of Exercise per Session: Not on file  Stress:   . Feeling of Stress : Not on file  Social Connections:   .  Frequency of Communication with Friends and Family: Not on file  . Frequency of Social Gatherings with Friends and Family: Not on file  . Attends Religious Services: Not on file  .  Active Member of Clubs or Organizations: Not on file  . Attends Banker Meetings: Not on file  . Marital Status: Not on file  Intimate Partner Violence:   . Fear of Current or Ex-Partner: Not on file  . Emotionally Abused: Not on file  . Physically Abused: Not on file  . Sexually Abused: Not on file    REVIEW OF SYSTEMS: Constitutional: Fatigue. ENT:  No nose bleeds Pulm: No shortness of breath, no cough CV:  No palpitations, no LE edema. Chest pain/pressure as above GU:  No hematuria, no frequency GI: See HPI Heme: No unusual bleeding or bruising. Transfusions: None Neuro: Painful burning and numbness in her lower extremities/feet. No seizures, no syncope, no dizziness Derm:  No itching, no rash or sores.  Endocrine:  No sweats or chills.  No polyuria or dysuria Immunization: Not queried Travel:  None beyond local counties in last few months.    PHYSICAL EXAM: Vital signs in last 24 hours: Vitals:   11/04/19 0400 11/04/19 0800  BP: (!) 120/50 (!) 115/47  Pulse: 60 75  Resp: 14 18  Temp:  98.3 F (36.8 C)  SpO2: 97% 90%   Wt Readings from Last 3 Encounters:  11/03/19 53.5 kg  08/20/19 54.3 kg  08/06/19 54.4 kg    General: Patient looks younger than stated age. Seems depressed. Does not look ill. Head: No facial asymmetry or swelling. No signs of head trauma. Eyes: No pallor or scleral icterus Ears: Not hard of hearing Nose: No congestion or discharge Mouth: Tongue midline. Mucosa moist, pink, clear. Neck: No JVD, no masses, no thyromegaly Lungs: Clear bilaterally. No labored breathing, no cough. Slight hoarseness and low volume to her vocal quality Heart: RRR. No MRG. S1, S2 present Abdomen: Soft. Diffuse tenderness throughout the abdomen does not seem worse in the upper abdomen or epigastrium. No guarding or rebound. No masses, HSM, bruits, hernias. Bowel sounds active. No distention..   Rectal: Deferred Musc/Skeltl: No joint redness, swelling or  gross deformity. Extremities: No CCE. Neurologic: Oriented x3. Moves all 4 limbs. No tremors, no gross weakness or deficits Skin: No suspicious rashes, sores, telangiectasia. Nodes: No cervical adenopathy Psych: Seems depressed but not tearful. Fluid speech.  Intake/Output from previous day: 09/13 0701 - 09/14 0700 In: 3 [I.V.:3] Out: -  Intake/Output this shift: Total I/O In: -  Out: 300 [Urine:300]  LAB RESULTS: Recent Labs    11/03/19 1252 11/04/19 0617  WBC 6.9 8.0  HGB 12.6 12.4  HCT 39.3 36.9  PLT 218 205   BMET Lab Results  Component Value Date   NA 138 11/04/2019   NA 139 11/03/2019   NA 139 08/06/2019   K 3.6 11/04/2019   K 4.2 11/03/2019   K 4.4 08/06/2019   CL 105 11/04/2019   CL 106 11/03/2019   CL 105 08/06/2019   CO2 23 11/04/2019   CO2 24 11/03/2019   CO2 24 08/06/2019   GLUCOSE 113 (H) 11/04/2019   GLUCOSE 96 11/03/2019   GLUCOSE 91 08/06/2019   BUN 14 11/04/2019   BUN 14 11/03/2019   BUN 12 08/06/2019   CREATININE 0.80 11/04/2019   CREATININE 0.91 11/03/2019   CREATININE 0.94 08/06/2019   CALCIUM 9.8 11/04/2019   CALCIUM 9.9 11/03/2019   CALCIUM 9.8 08/06/2019  LFT Recent Labs    11/03/19 1528 11/04/19 0617  PROT 7.0 6.4*  ALBUMIN 4.2 3.6  AST 42* 21  ALT 21 19  ALKPHOS 53 52  BILITOT 1.0 0.3  BILIDIR 0.5* <0.1  IBILI 0.5 NOT CALCULATED   PT/INR Lab Results  Component Value Date   INR 1.0 11/03/2019   INR 1.7 (H) 07/10/2018   INR 1.1 07/10/2018   Hepatitis Panel No results for input(s): HEPBSAG, HCVAB, HEPAIGM, HEPBIGM in the last 72 hours. C-Diff No components found for: CDIFF Lipase     Component Value Date/Time   LIPASE 32 11/04/2019 0617     RADIOLOGY STUDIES: DG Chest 2 View  Result Date: 11/03/2019 CLINICAL DATA:  Chest pain. EXAM: CHEST - 2 VIEW COMPARISON:  Chest x-ray dated July 24, 2019. FINDINGS: The heart size and mediastinal contours are within normal limits. Prior CABG. Normal pulmonary  vascularity. No focal consolidation, pleural effusion, or pneumothorax. No acute osseous abnormality. IMPRESSION: No active cardiopulmonary disease. Electronically Signed   By: Obie Dredge M.D.   On: 11/03/2019 13:27   CT Angio Chest/Abd/Pel for Dissection W and/or Wo Contrast  Result Date: 11/03/2019 CLINICAL DATA:  Chest pain and back pain, aortic dissection suspected. Blood pressure over 225. EXAM: CT ANGIOGRAPHY CHEST, ABDOMEN AND PELVIS TECHNIQUE: Non-contrast CT of the chest was initially obtained. Multidetector CT imaging through the chest, abdomen and pelvis was performed using the standard protocol during bolus administration of intravenous contrast. Multiplanar reconstructed images and MIPs were obtained and reviewed to evaluate the vascular anatomy. "Pt allergic to contrast- had 4 hour prep ( but is allergic to benedryl and prednisone so used alternate meds.)" CONTRAST:  OMNIPAQUE IOHEXOL 350 MG/ML SOLN COMPARISON:  CT coronary 05/06/2018, CT angio chest 04/22/2019, CT abdomen pelvis 07/13/2017, CT angio chest 01/30/2019. FINDINGS: CTA CHEST FINDINGS Cardiovascular: Preferential opacification of the thoracic aorta. Similar-appearing crescentic peripheral hypodensity along the aortic arch that originates just distal to left subclavian artery (10:55-57, 11:79-82) and extends to the origin of the descending thoracic aorta. This finding is present on prior CT chest 04/22/2019 and 01/30/2019; however, is very poorly visualized and evaluated on the prior studies due to non-optimization for the visualization of the aorta on these prior studies. No evidence of thoracic aortic aneurysm. No periaortic fat stranding. Normal heart size. No significant pericardial effusion. The main pulmonary artery is normal in caliber. No central or segmental pulmonary embolus. At least 4 vessel mild coronary artery calcifications in a patient status post coronary artery bypass graft. Mediastinum/Nodes: Calcified right  hilar lymph node (7:78). Prominent but nonenlarged left hilar lymph node (7:70). No enlarged mediastinal, hilar, or axillary lymph nodes. Thyroid gland, trachea, and esophagus demonstrate no significant findings. Lungs/Pleura: Few scattered calcified granuloma. Biapical pleural/pulmonary scarring. Few scattered pulmonary micro nodules. Right basilar 5 mm pulmonary nodule (7:129). Left basilar 4 mm pulmonary nodule (7:125). No pulmonary mass. No focal consolidation. No pleural effusion or pneumothorax. Musculoskeletal: No chest wall abnormality. No acute or significant osseous findings. No acute displaced fracture. Review of the MIP images confirms the above findings. CTA ABDOMEN AND PELVIS FINDINGS VASCULAR Aorta: Severe calcified and noncalcified atherosclerotic plaque. Normal caliber aorta without aneurysm, dissection, vasculitis or significant stenosis. No periaortic fat stranding. Celiac: Patent without evidence of aneurysm, dissection, vasculitis or significant stenosis. SMA: Patent without evidence of aneurysm, dissection, vasculitis or significant stenosis. Replaced right hepatic artery originating off of the superior mesenteric artery. Renals: The right renal artery is diminutive in size compared to the left.  Both renal arteries are patent without evidence of aneurysm, dissection, vasculitis, fibromuscular dysplasia or significant stenosis. IMA: Patent without evidence of aneurysm, dissection, vasculitis or significant stenosis. Iliacs: Mild to moderate calcified and noncalcified atherosclerotic plaque. Patent without evidence of aneurysm, dissection, vasculitis or significant stenosis. Veins: No obvious venous abnormality within the limitations of this arterial phase study. Other: Mild soft tissue density along the right femoral vessels likely scarring for prior access. No aneurysm identified at this level. Review of the MIP images confirms the above findings. NON-VASCULAR Hepatobiliary: No focal liver  abnormality is seen. Hypodense appearance of the hepatic parenchyma mid to the splenic parenchyma likely due to timing of contrast. Status post cholecystectomy. No biliary dilatation. Pancreas: Diffusely atrophic. Persistent main pancreatic duct dilatation within the head and neck of the pancreas measuring up to 5 mm. Spleen: Normal in size without focal abnormality. Adrenals/Urinary Tract: Adrenal glands are unremarkable. Kidneys are normal, without renal calculi, focal lesion, or hydronephrosis. Urinary bladder is unremarkable. Stomach/Bowel: Stomach is within normal limits. The appendix is not definitely identified. No evidence of bowel wall thickening, distention, or inflammatory changes. Diffuse sigmoid diverticulosis. Lymphatic: No abdominal, pelvic, inguinal lymphadenopathy. Reproductive: Status post hysterectomy. No adnexal masses. Other: No abdominal wall hernia or abnormality. No abdominopelvic ascites. No free intraperitoneal gas. No organized fluid collection. Musculoskeletal: No acute or significant osseous findings. Review of the MIP images confirms the above findings. IMPRESSION: 1. Similar-appearing peripheral crescentic hypodensity along the aortic arch that originates just distal to the left subclavian artery and terminates at the origin of the descending thoracic aorta that may represent a stable type B aortic dissection versus noncalcified thrombus in a patient with severe calcified and noncalcified atherosclerotic plaque of the aorta and its branches. No aorta aneurysm or intramural hematoma. 2. Persistent main pancreatic duct dilatation up to 5 mm within the head and neck of the pancreas. Findings may represent intraductal papillary mucinous neoplasm. 3. Couple of bibasilar pulmonary nodules measuring up to 5 mm in a patient demonstrating sequela of prior granulomatous disease. These results were called by telephone at the time of interpretation on 11/03/2019 at 9:20 pm to provider Dr. Jacqulyn Bath, who  verbally acknowledged these results. Electronically Signed   By: Tish Frederickson M.D.   On: 11/03/2019 21:18     IMPRESSION:   *    Long standing hx intermittent epigastric pain, distress, nausea.   Current CT shows pancreatic atrophy and dilated PD to 5 mm? Intraductal papillary mucinous neoplasm.   Lipase improved but slightly elevated to max 65.  LFTS normal.   Trops normal.       *   Stable type b aortic dissection vs thrombus per CT.  Cardiologist says "is a chronic finding, unchanged and does not explain her current symptoms"  *  Multiple somatic complaints.   Pt seems depressed.  Many of her sxs may be functional in nature.     PLAN:     *   ? Trial of creon given pancreatic atrophy on CT, however pt is constipation, not diarrhea prone.    *   ? EUS.     Jennye Moccasin  11/04/2019, 12:37 PM Phone [phone]

## 2019-11-04 NOTE — Progress Notes (Signed)
Progress Note  Patient Name: Leah Pittman Date of Encounter: 11/04/2019  [facility] Cardiologist: Thurmon Fair, MD   Subjective   Complaints today do not include chest pain. She has epigastric discomfort, abdominal bloating and early satiety, as well as a lot of fatigue  Inpatient Medications    Scheduled Meds:  aspirin EC  81 mg Oral Daily   dorzolamide-timolol  1 drop Left Eye BID   enoxaparin (LOVENOX) injection  40 mg Subcutaneous QHS   latanoprost  1 drop Both Eyes QHS   levothyroxine  100 mcg Oral QAC breakfast   losartan  25 mg Oral Daily   pantoprazole  40 mg Oral Daily   simvastatin  20 mg Oral QHS   sodium chloride flush  3 mL Intravenous Q12H   sodium chloride flush  3 mL Intravenous Q12H   Continuous Infusions:  sodium chloride     PRN Meds: sodium chloride, acetaminophen **OR** acetaminophen, albuterol, bisacodyl, fentaNYL (SUBLIMAZE) injection, guaiFENesin-dextromethorphan, labetalol, LORazepam, ondansetron **OR** ondansetron (ZOFRAN) IV, senna-docusate, sodium chloride flush   Vital Signs    Vitals:   11/04/19 0049 11/04/19 0300 11/04/19 0400 11/04/19 0800  BP: (!) 116/50  (!) 120/50 (!) 115/47  Pulse: 63 (!) 58 60 75  Resp: 14 17 14 18   Temp:    98.3 F (36.8 C)  TempSrc:    Oral  SpO2: 98% 98% 97% 90%  Weight:      Height:        Intake/Output Summary (Last 24 hours) at 11/04/2019 0913 Last data filed at 11/03/2019 2313 Gross per 24 hour  Intake 3 ml  Output --  Net 3 ml   Last 3 Weights 11/03/2019 08/20/2019 08/06/2019  Weight (lbs) 118 lb 119 lb 12.8 oz 120 lb  Weight (kg) 53.524 kg 54.341 kg 54.432 kg      Telemetry    NSR - Personally Reviewed  ECG    NSR, normal tracing - Personally Reviewed  Physical Exam  Appears tired, otherwise well GEN: No acute distress.   Neck: No JVD Cardiac: RRR, no murmurs, rubs, or gallops.  Respiratory: Clear to auscultation bilaterally. GI: Soft, tender to deep touch in  epigastrium, without rebound/guarding, non-distended. Normal bowel sounds  MS: No edema; No deformity. Neuro:  Nonfocal  Psych: Normal affect   Labs    High Sensitivity Troponin:   Recent Labs  Lab 11/03/19 1252 11/03/19 1528 11/03/19 2315 11/04/19 0046  TROPONINIHS 5 7 7 6       Chemistry Recent Labs  Lab 11/03/19 1252 11/03/19 1528 11/04/19 0617  NA 139  --  138  K 4.2  --  3.6  CL 106  --  105  CO2 24  --  23  GLUCOSE 96  --  113*  BUN 14  --  14  CREATININE 0.91  --  0.80  CALCIUM 9.9  --  9.8  PROT  --  7.0 6.4*  ALBUMIN  --  4.2 3.6  AST  --  42* 21  ALT  --  21 19  ALKPHOS  --  53 52  BILITOT  --  1.0 0.3  GFRNONAA 57*  --  >60  GFRAA >60  --  >60  ANIONGAP 9  --  10     Hematology Recent Labs  Lab 11/03/19 1252 11/04/19 0617  WBC 6.9 8.0  RBC 4.11 3.99  HGB 12.6 12.4  HCT 39.3 36.9  MCV 95.6 92.5  MCH 30.7 31.1  MCHC 32.1 33.6  RDW 14.4 14.4  PLT 218 205    BNP Recent Labs  Lab 11/03/19 1528  BNP 149.8*     DDimer No results for input(s): DDIMER in the last 168 hours.   Radiology    DG Chest 2 View  Result Date: 11/03/2019 CLINICAL DATA:  Chest pain. EXAM: CHEST - 2 VIEW COMPARISON:  Chest x-ray dated July 24, 2019. FINDINGS: The heart size and mediastinal contours are within normal limits. Prior CABG. Normal pulmonary vascularity. No focal consolidation, pleural effusion, or pneumothorax. No acute osseous abnormality. IMPRESSION: No active cardiopulmonary disease. Electronically Signed   By: Obie Dredge M.D.   On: 11/03/2019 13:27   CT Angio Chest/Abd/Pel for Dissection W and/or Wo Contrast  Result Date: 11/03/2019 CLINICAL DATA:  Chest pain and back pain, aortic dissection suspected. Blood pressure over 225. EXAM: CT ANGIOGRAPHY CHEST, ABDOMEN AND PELVIS TECHNIQUE: Non-contrast CT of the chest was initially obtained. Multidetector CT imaging through the chest, abdomen and pelvis was performed using the standard protocol during  bolus administration of intravenous contrast. Multiplanar reconstructed images and MIPs were obtained and reviewed to evaluate the vascular anatomy. "Pt allergic to contrast- had 4 hour prep ( but is allergic to benedryl and prednisone so used alternate meds.)" CONTRAST:  OMNIPAQUE IOHEXOL 350 MG/ML SOLN COMPARISON:  CT coronary 05/06/2018, CT angio chest 04/22/2019, CT abdomen pelvis 07/13/2017, CT angio chest 01/30/2019. FINDINGS: CTA CHEST FINDINGS Cardiovascular: Preferential opacification of the thoracic aorta. Similar-appearing crescentic peripheral hypodensity along the aortic arch that originates just distal to left subclavian artery (10:55-57, 11:79-82) and extends to the origin of the descending thoracic aorta. This finding is present on prior CT chest 04/22/2019 and 01/30/2019; however, is very poorly visualized and evaluated on the prior studies due to non-optimization for the visualization of the aorta on these prior studies. No evidence of thoracic aortic aneurysm. No periaortic fat stranding. Normal heart size. No significant pericardial effusion. The main pulmonary artery is normal in caliber. No central or segmental pulmonary embolus. At least 4 vessel mild coronary artery calcifications in a patient status post coronary artery bypass graft. Mediastinum/Nodes: Calcified right hilar lymph node (7:78). Prominent but nonenlarged left hilar lymph node (7:70). No enlarged mediastinal, hilar, or axillary lymph nodes. Thyroid gland, trachea, and esophagus demonstrate no significant findings. Lungs/Pleura: Few scattered calcified granuloma. Biapical pleural/pulmonary scarring. Few scattered pulmonary micro nodules. Right basilar 5 mm pulmonary nodule (7:129). Left basilar 4 mm pulmonary nodule (7:125). No pulmonary mass. No focal consolidation. No pleural effusion or pneumothorax. Musculoskeletal: No chest wall abnormality. No acute or significant osseous findings. No acute displaced fracture. Review  of the MIP images confirms the above findings. CTA ABDOMEN AND PELVIS FINDINGS VASCULAR Aorta: Severe calcified and noncalcified atherosclerotic plaque. Normal caliber aorta without aneurysm, dissection, vasculitis or significant stenosis. No periaortic fat stranding. Celiac: Patent without evidence of aneurysm, dissection, vasculitis or significant stenosis. SMA: Patent without evidence of aneurysm, dissection, vasculitis or significant stenosis. Replaced right hepatic artery originating off of the superior mesenteric artery. Renals: The right renal artery is diminutive in size compared to the left. Both renal arteries are patent without evidence of aneurysm, dissection, vasculitis, fibromuscular dysplasia or significant stenosis. IMA: Patent without evidence of aneurysm, dissection, vasculitis or significant stenosis. Iliacs: Mild to moderate calcified and noncalcified atherosclerotic plaque. Patent without evidence of aneurysm, dissection, vasculitis or significant stenosis. Veins: No obvious venous abnormality within the limitations of this arterial phase study. Other: Mild soft tissue density along the right femoral vessels likely  scarring for prior access. No aneurysm identified at this level. Review of the MIP images confirms the above findings. NON-VASCULAR Hepatobiliary: No focal liver abnormality is seen. Hypodense appearance of the hepatic parenchyma mid to the splenic parenchyma likely due to timing of contrast. Status post cholecystectomy. No biliary dilatation. Pancreas: Diffusely atrophic. Persistent main pancreatic duct dilatation within the head and neck of the pancreas measuring up to 5 mm. Spleen: Normal in size without focal abnormality. Adrenals/Urinary Tract: Adrenal glands are unremarkable. Kidneys are normal, without renal calculi, focal lesion, or hydronephrosis. Urinary bladder is unremarkable. Stomach/Bowel: Stomach is within normal limits. The appendix is not definitely identified. No  evidence of bowel wall thickening, distention, or inflammatory changes. Diffuse sigmoid diverticulosis. Lymphatic: No abdominal, pelvic, inguinal lymphadenopathy. Reproductive: Status post hysterectomy. No adnexal masses. Other: No abdominal wall hernia or abnormality. No abdominopelvic ascites. No free intraperitoneal gas. No organized fluid collection. Musculoskeletal: No acute or significant osseous findings. Review of the MIP images confirms the above findings. IMPRESSION: 1. Similar-appearing peripheral crescentic hypodensity along the aortic arch that originates just distal to the left subclavian artery and terminates at the origin of the descending thoracic aorta that may represent a stable type B aortic dissection versus noncalcified thrombus in a patient with severe calcified and noncalcified atherosclerotic plaque of the aorta and its branches. No aorta aneurysm or intramural hematoma. 2. Persistent main pancreatic duct dilatation up to 5 mm within the head and neck of the pancreas. Findings may represent intraductal papillary mucinous neoplasm. 3. Couple of bibasilar pulmonary nodules measuring up to 5 mm in a patient demonstrating sequela of prior granulomatous disease. These results were called by telephone at the time of interpretation on 11/03/2019 at 9:20 pm to provider Dr. Jacqulyn BathLong, who verbally acknowledged these results. Electronically Signed   By: Tish FredericksonMorgane  Naveau M.D.   On: 11/03/2019 21:18    Cardiac Studies   LEFT HEART CATH AND CORONARY ANGIOGRAPHY 07/25/2019  Conclusion    Prox RCA to Mid RCA lesion is 40% stenosed.  Ost LM to Mid LM lesion is 60% stenosed.  Mid Cx lesion is 99% stenosed.  SVG graft was visualized by angiography and is normal in caliber.  The graft exhibits no disease.  SVG graft was visualized by angiography and is normal in caliber.  The graft exhibits no disease.  Mid LAD lesion is 70% stenosed.   Severe double vessel CAD Moderately severe ostial  left main stenosis Severe stenosis mid LAD. The SVG to the LAD is patent. The LIMA does not appear to have been used despite the findings in the operative report. The LIMA is small and atretic.  Severe stenosis mid Circumflex. OM and OM2 fill from the patent vein graft.  Mild disease mid RCA  Recommendations: Continue medical management of CAD. Will start lactulose for constipation. She could be discharged later today after bedrest if she is stable.   Patient Profile     86 y.o. female with CAD (CABG May 2020, SVG x 2 patent by cath 07/2018, atretic LIMA), aortic atherosclerosis, mixed hyperlipidemia, volatile HTN admitted with chest and epigastric discomfort  Assessment & Plan    1. CAD s/p CABG: normal ECG and enzymes and favorable findings on very recent cardiac cath (LIMA probably atretic due to competitive flow from SVG). Chest pain is non cardiac. On ASA and statin. Recheck lipids. (LDL was at target on year ago) 2. Aortic atherosclerosis: noncalcified plaque/thrombus in proximal descending aorta versus very short type B dissection is a chronic  finding, unchanged and does not explain her current symptoms. 3. Epigastric tenderness: abdominal CT shows extensive atherosclerosis, atrophy of the pancreas and a dilated pancreatic duct, raising concern for "intraductal papillary mucinous neoplasm."  Check lipase (although no inflammatory changes). Has diverticulosis and previous episodes of acute diverticulitis, but no evidence of inflammation on current CT. Requested GI evaluation (her previous GI specialist in [place] is no longer there). 4. HTN: now well controlled. She has a history of marked elevation in BP when emotional or in physical distress.     For questions or updates, please contact [facility] Please consult [email or web address] for contact info under        Signed, Thurmon Fair, MD  11/04/2019, 9:13 AM

## 2019-11-04 NOTE — ED Notes (Signed)
Patient provided sprite per her request

## 2019-11-04 NOTE — Progress Notes (Addendum)
PROGRESS NOTE    Leah Pittman  WUJ:[id number] DOB: March 11, 1930 DOA: 11/03/2019 PCP: Blane Ohara, MD    Brief Narrative:  86 y.o. female with medical history significant for CAD status post CABG in May 2020, hypertension, anxiety, and hypothyroidism, now presenting to the emergency department for evaluation of chest pain.  The patient reports chest pain, shortness of breath, and fatigue for the past several days that became severe yesterday and is now constant.  Symptoms seem to be worse with exertion but do not resolve with rest.  She describes a severe pressure sensation over the central chest, sometimes radiating to the right chest, and associated with shortness of breath without wheezing or fever.  She reports some low blood pressure readings at home and does not take her antihypertensives every day because of this.  She denies any headache, change in vision or hearing, or focal weakness, but reports bilateral foot numbness that seems to be chronic.  She has not tried any nitroglycerin for the current symptoms.  She reports chronic epigastric pain and chronic constipation that seems to be worse in recent months and for which she was planning to follow-up with her gastroenterologist who she has not seen in over a year.  ED Course: Upon arrival to the ED, patient is found to be afebrile, saturating well on room air, and hypertensive to 230/130.  EKG features a sinus rhythm.  Chest x-ray is negative for acute cardiopulmonary disease.  CTA chest/abdomen/pelvis is negative for acute findings but notable for possible stable type B aortic dissection versus noncalcified thrombus involving the aortic arch, as well as persistent dilation of the pancreatic duct which could reflect papillary mucinous neoplasm.  Chemistry panel and CBC are unremarkable.  Covid PCR is negative.  High-sensitivity troponin is normal x2.  BNP is mildly elevated.  TSH is elevated to 6.815.  Patient was treated with labetalol and  hydralazine in the ED.  Cardiology was consulted by the ED physician and hospitalists asked to admit.   Assessment & Plan:   Principal Problem:   Chest pain Active Problems:   Coronary artery disease involving native coronary artery of native heart without angina pectoris   Hypertensive crisis   Chronic obstructive pulmonary disease (HCC)   Hypothyroidism   Pancreatic duct dilated   Descending thoracic aortic dissection (HCC)   1. Chest pain; CAD  - Presents with several days of chest discomfort and SOB, is found to have severely elevated BP, normal HS troponin x2, no acute ischemic features on EKG, and no acute findings on CT dissection study  - Cardiology was consulted and is following  - Continue cardiac monitoring, continue BP-control, statin, aspirin, ARB   - Discussed with Cardiology, symptoms likely not cardiogenic  2. Hypertensive crisis  - BP as high as 229/129 in ED without headache or neuro deficits, no acute findings on dissection study, and normalized after IV labetalol and hydralazine in ED  - Patient reports labile pressures at home and admitted to taking bp meds only when sbp <140. Have advised pt to be more compliant with hypertensive meds from now on - Continue losartan, use labetalol as needed   3. Anxiety  - She is anxious in ED, continue as-needed Ativan    4. Hypothyroidism  - TSH 6.815 on admission  - T4 1.14   5. COPD  - Appears to be stable, continue albuterol as needed    6. Pancreatic duct dilation  - Persistent dilation of pancreatic duct noted on CT in  ED, papillary mucinous neoplasm is a consideration, may need EUS with FNA -Lipase mildly elevated at 65 -[facility] consulted, will f/u recs -Pt reports decreased tolerance to regular PO intake secondary to RUQ and epigastric pain and increased thirst. Dry membranes on exam -Will downgrade diet to clear liquid and start basal IVF hydration  7. Stable type b aortic dissection vs  non-calcified thrombus on CTA -Will need very good BP control. See above, recommended better compliance with BP regimen, especially as pt typically self-doses bp meds on an as-needed basis only -Likely need serial imaging surveillance   DVT prophylaxis: Lovenox subq Code Status: DNR Family Communication: Pt in room, family at bedside  Status is: Observation  The patient will require care spanning > 2 midnights and should be moved to inpatient because: Ongoing diagnostic testing needed not appropriate for outpatient work up, Unsafe d/c plan and IV treatments appropriate due to intensity of illness or inability to take PO  Dispo: The patient is from: Home              Anticipated d/c is to: Home              Anticipated d/c date is: 3 days              Patient currently is not medically stable to d/c.       Consultants:   Cardiology  GI  Procedures:     Antimicrobials: Anti-infectives (From admission, onward)   None       Subjective: Complaining of poor tolerance to regular PO intake secondary to RUQ and epigastric discomfort with food. Feels thirsty.  Objective: Vitals:   11/04/19 0300 11/04/19 0400 11/04/19 0800 11/04/19 1311  BP:  (!) 120/50 (!) 115/47 (!) 132/52  Pulse: (!) 58 60 75 60  Resp: 17 14 18 16   Temp:   98.3 F (36.8 C) 97.7 F (36.5 C)  TempSrc:   Oral Oral  SpO2: 98% 97% 90% 99%  Weight:      Height:        Intake/Output Summary (Last 24 hours) at 11/04/2019 1616 Last data filed at 11/04/2019 1500 Gross per 24 hour  Intake 483.32 ml  Output 300 ml  Net 183.32 ml   Filed Weights   11/03/19 1254  Weight: 53.5 kg    Examination:  General exam: Appears calm and comfortable  Respiratory system: Clear to auscultation. Respiratory effort normal. Cardiovascular system: S1 & S2 heard, Regular Gastrointestinal system: nondistended, tenderness over RUQ and epigastric region Central nervous system: Alert and oriented. No focal neurological  deficits. Extremities: Symmetric 5 x 5 power. Skin: No rashes, lesions Psychiatry: Judgement and insight appear normal. Mood & affect appropriate.   Data Reviewed: I have personally reviewed following labs and imaging studies  CBC: Recent Labs  Lab 11/03/19 1252 11/04/19 0617  WBC 6.9 8.0  HGB 12.6 12.4  HCT 39.3 36.9  MCV 95.6 92.5  PLT 218 205   Basic Metabolic Panel: Recent Labs  Lab 11/03/19 1252 11/04/19 0617  NA 139 138  K 4.2 3.6  CL 106 105  CO2 24 23  GLUCOSE 96 113*  BUN 14 14  CREATININE 0.91 0.80  CALCIUM 9.9 9.8  MG  --  2.0   GFR: Estimated Creatinine Clearance: 42.6 mL/min (by C-G formula based on SCr of 0.8 mg/dL). Liver Function Tests: Recent Labs  Lab 11/03/19 1528 11/04/19 0617  AST 42* 21  ALT 21 19  ALKPHOS 53 52  BILITOT 1.0  0.3  PROT 7.0 6.4*  ALBUMIN 4.2 3.6   Recent Labs  Lab 11/03/19 1528 11/04/19 0617  LIPASE 65* 32   No results for input(s): AMMONIA in the last 168 hours. Coagulation Profile: Recent Labs  Lab 11/03/19 1648  INR 1.0   Cardiac Enzymes: No results for input(s): CKTOTAL, CKMB, CKMBINDEX, TROPONINI in the last 168 hours. BNP (last 3 results) No results for input(s): PROBNP in the last 8760 hours. HbA1C: No results for input(s): HGBA1C in the last 72 hours. CBG: No results for input(s): GLUCAP in the last 168 hours. Lipid Profile: Recent Labs    11/04/19 0617  CHOL 206*  HDL 69  LDLCALC 120*  TRIG 86  CHOLHDL 3.0   Thyroid Function Tests: Recent Labs    11/03/19 1528 11/04/19 0617  TSH 6.815*  --   FREET4  --  1.14*   Anemia Panel: No results for input(s): VITAMINB12, FOLATE, FERRITIN, TIBC, IRON, RETICCTPCT in the last 72 hours. Sepsis Labs: No results for input(s): PROCALCITON, LATICACIDVEN in the last 168 hours.  Recent Results (from the past 240 hour(s))  SARS Coronavirus 2 by RT PCR (hospital order, performed in [facility] hospital lab) Nasopharyngeal Nasopharyngeal Swab      Status: None   Collection Time: 11/03/19  3:34 PM   Specimen: Nasopharyngeal Swab  Result Value Ref Range Status   SARS Coronavirus 2 NEGATIVE NEGATIVE Final    Comment: (NOTE) SARS-CoV-2 target nucleic acids are NOT DETECTED.  The SARS-CoV-2 RNA is generally detectable in upper and lower respiratory specimens during the acute phase of infection. The lowest concentration of SARS-CoV-2 viral copies this assay can detect is 250 copies / mL. A negative result does not preclude SARS-CoV-2 infection and should not be used as the sole basis for treatment or other patient management decisions.  A negative result may occur with improper specimen collection / handling, submission of specimen other than nasopharyngeal swab, presence of viral mutation(s) within the areas targeted by this assay, and inadequate number of viral copies (<250 copies / mL). A negative result must be combined with clinical observations, patient history, and epidemiological information.  Fact Sheet for Patients:   BoilerBrush.com.cy  Fact Sheet for Healthcare Providers: https://pope.com/  This test is not yet approved or  cleared by the [place] FDA and has been authorized for detection and/or diagnosis of SARS-CoV-2 by FDA under an Emergency Use Authorization (EUA).  This EUA will remain in effect (meaning this test can be used) for the duration of the COVID-19 declaration under Section 564(b)(1) of the Act, 21 U.S.C. section 360bbb-3(b)(1), unless the authorization is terminated or revoked sooner.  Performed at [facility], [address], [place], [place] [address]      Radiology Studies: DG Chest 2 View  Result Date: 11/03/2019 CLINICAL DATA:  Chest pain. EXAM: CHEST - 2 VIEW COMPARISON:  Chest x-ray dated July 24, 2019. FINDINGS: The heart size and mediastinal contours are within normal limits. Prior CABG. Normal pulmonary vascularity. No focal  consolidation, pleural effusion, or pneumothorax. No acute osseous abnormality. IMPRESSION: No active cardiopulmonary disease. Electronically Signed   By: Obie Dredge M.D.   On: 11/03/2019 13:27   CT Angio Chest/Abd/Pel for Dissection W and/or Wo Contrast  Result Date: 11/03/2019 CLINICAL DATA:  Chest pain and back pain, aortic dissection suspected. Blood pressure over 225. EXAM: CT ANGIOGRAPHY CHEST, ABDOMEN AND PELVIS TECHNIQUE: Non-contrast CT of the chest was initially obtained. Multidetector CT imaging through the chest, abdomen and  pelvis was performed using the standard protocol during bolus administration of intravenous contrast. Multiplanar reconstructed images and MIPs were obtained and reviewed to evaluate the vascular anatomy. "Pt allergic to contrast- had 4 hour prep ( but is allergic to benedryl and prednisone so used alternate meds.)" CONTRAST:  100mL OMNIPAQUE IOHEXOL 350 MG/ML SOLN COMPARISON:  CT coronary 05/06/2018, CT angio chest 04/22/2019, CT abdomen pelvis 07/13/2017, CT angio chest 01/30/2019. FINDINGS: CTA CHEST FINDINGS Cardiovascular: Preferential opacification of the thoracic aorta. Similar-appearing crescentic peripheral hypodensity along the aortic arch that originates just distal to left subclavian artery (10:55-57, 11:79-82) and extends to the origin of the descending thoracic aorta. This finding is present on prior CT chest 04/22/2019 and 01/30/2019; however, is very poorly visualized and evaluated on the prior studies due to non-optimization for the visualization of the aorta on these prior studies. No evidence of thoracic aortic aneurysm. No periaortic fat stranding. Normal heart size. No significant pericardial effusion. The main pulmonary artery is normal in caliber. No central or segmental pulmonary embolus. At least 4 vessel mild coronary artery calcifications in a patient status post coronary artery bypass graft. Mediastinum/Nodes: Calcified right hilar lymph node  (7:78). Prominent but nonenlarged left hilar lymph node (7:70). No enlarged mediastinal, hilar, or axillary lymph nodes. Thyroid gland, trachea, and esophagus demonstrate no significant findings. Lungs/Pleura: Few scattered calcified granuloma. Biapical pleural/pulmonary scarring. Few scattered pulmonary micro nodules. Right basilar 5 mm pulmonary nodule (7:129). Left basilar 4 mm pulmonary nodule (7:125). No pulmonary mass. No focal consolidation. No pleural effusion or pneumothorax. Musculoskeletal: No chest wall abnormality. No acute or significant osseous findings. No acute displaced fracture. Review of the MIP images confirms the above findings. CTA ABDOMEN AND PELVIS FINDINGS VASCULAR Aorta: Severe calcified and noncalcified atherosclerotic plaque. Normal caliber aorta without aneurysm, dissection, vasculitis or significant stenosis. No periaortic fat stranding. Celiac: Patent without evidence of aneurysm, dissection, vasculitis or significant stenosis. SMA: Patent without evidence of aneurysm, dissection, vasculitis or significant stenosis. Replaced right hepatic artery originating off of the superior mesenteric artery. Renals: The right renal artery is diminutive in size compared to the left. Both renal arteries are patent without evidence of aneurysm, dissection, vasculitis, fibromuscular dysplasia or significant stenosis. IMA: Patent without evidence of aneurysm, dissection, vasculitis or significant stenosis. Iliacs: Mild to moderate calcified and noncalcified atherosclerotic plaque. Patent without evidence of aneurysm, dissection, vasculitis or significant stenosis. Veins: No obvious venous abnormality within the limitations of this arterial phase study. Other: Mild soft tissue density along the right femoral vessels likely scarring for prior access. No aneurysm identified at this level. Review of the MIP images confirms the above findings. NON-VASCULAR Hepatobiliary: No focal liver abnormality is seen.  Hypodense appearance of the hepatic parenchyma mid to the splenic parenchyma likely due to timing of contrast. Status post cholecystectomy. No biliary dilatation. Pancreas: Diffusely atrophic. Persistent main pancreatic duct dilatation within the head and neck of the pancreas measuring up to 5 mm. Spleen: Normal in size without focal abnormality. Adrenals/Urinary Tract: Adrenal glands are unremarkable. Kidneys are normal, without renal calculi, focal lesion, or hydronephrosis. Urinary bladder is unremarkable. Stomach/Bowel: Stomach is within normal limits. The appendix is not definitely identified. No evidence of bowel wall thickening, distention, or inflammatory changes. Diffuse sigmoid diverticulosis. Lymphatic: No abdominal, pelvic, inguinal lymphadenopathy. Reproductive: Status post hysterectomy. No adnexal masses. Other: No abdominal wall hernia or abnormality. No abdominopelvic ascites. No free intraperitoneal gas. No organized fluid collection. Musculoskeletal: No acute or significant osseous findings. Review of the MIP images confirms  the above findings. IMPRESSION: 1. Similar-appearing peripheral crescentic hypodensity along the aortic arch that originates just distal to the left subclavian artery and terminates at the origin of the descending thoracic aorta that may represent a stable type B aortic dissection versus noncalcified thrombus in a patient with severe calcified and noncalcified atherosclerotic plaque of the aorta and its branches. No aorta aneurysm or intramural hematoma. 2. Persistent main pancreatic duct dilatation up to 5 mm within the head and neck of the pancreas. Findings may represent intraductal papillary mucinous neoplasm. 3. Couple of bibasilar pulmonary nodules measuring up to 5 mm in a patient demonstrating sequela of prior granulomatous disease. These results were called by telephone at the time of interpretation on 11/03/2019 at 9:20 pm to provider Dr. Jacqulyn Bath, who verbally  acknowledged these results. Electronically Signed   By: Tish Frederickson M.D.   On: 11/03/2019 21:18    Scheduled Meds: . aspirin EC  81 mg Oral Daily  . dorzolamide-timolol  1 drop Left Eye BID  . enoxaparin (LOVENOX) injection  40 mg Subcutaneous QHS  . latanoprost  1 drop Both Eyes QHS  . levothyroxine  100 mcg Oral QAC breakfast  . losartan  25 mg Oral Daily  . pantoprazole  40 mg Oral Daily  . simvastatin  20 mg Oral QHS  . sodium chloride flush  3 mL Intravenous Q12H  . sodium chloride flush  3 mL Intravenous Q12H   Continuous Infusions: . sodium chloride    . sodium chloride 75 mL/hr at 11/04/19 1500     LOS: 0 days   Rickey Barbara, MD [facility] Pager On Amion  If 7PM-7AM, please contact night-coverage 11/04/2019, 4:16 PM

## 2019-11-05 ENCOUNTER — Encounter (HOSPITAL_COMMUNITY)
Admission: EM | Disposition: A | Discharge status: Home / Self Care | Payer: Self-pay | Source: Home / Self Care | Attending: Internal Medicine

## 2019-11-05 ENCOUNTER — Ambulatory Visit: Payer: Medicare Other

## 2019-11-05 ENCOUNTER — Observation Stay (HOSPITAL_COMMUNITY): Payer: Medicare Other | Admitting: Certified Registered"

## 2019-11-05 ENCOUNTER — Observation Stay (HOSPITAL_COMMUNITY): Payer: Medicare Other

## 2019-11-05 ENCOUNTER — Telehealth: Payer: Medicare Other

## 2019-11-05 DIAGNOSIS — I700 Atherosclerosis of aorta: Secondary | ICD-10-CM | POA: Diagnosis present

## 2019-11-05 DIAGNOSIS — R030 Elevated blood-pressure reading, without diagnosis of hypertension: Secondary | ICD-10-CM | POA: Diagnosis not present

## 2019-11-05 DIAGNOSIS — K581 Irritable bowel syndrome with constipation: Secondary | ICD-10-CM

## 2019-11-05 DIAGNOSIS — Z20822 Contact with and (suspected) exposure to covid-19: Secondary | ICD-10-CM | POA: Diagnosis not present

## 2019-11-05 DIAGNOSIS — Z8249 Family history of ischemic heart disease and other diseases of the circulatory system: Secondary | ICD-10-CM | POA: Diagnosis not present

## 2019-11-05 DIAGNOSIS — I7101 Dissection of thoracic aorta: Secondary | ICD-10-CM | POA: Diagnosis not present

## 2019-11-05 DIAGNOSIS — R1013 Epigastric pain: Secondary | ICD-10-CM | POA: Diagnosis present

## 2019-11-05 DIAGNOSIS — Z66 Do not resuscitate: Secondary | ICD-10-CM | POA: Diagnosis not present

## 2019-11-05 DIAGNOSIS — K8689 Other specified diseases of pancreas: Secondary | ICD-10-CM | POA: Diagnosis present

## 2019-11-05 DIAGNOSIS — I169 Hypertensive crisis, unspecified: Secondary | ICD-10-CM | POA: Diagnosis not present

## 2019-11-05 DIAGNOSIS — Z951 Presence of aortocoronary bypass graft: Secondary | ICD-10-CM | POA: Diagnosis not present

## 2019-11-05 DIAGNOSIS — E039 Hypothyroidism, unspecified: Secondary | ICD-10-CM | POA: Diagnosis not present

## 2019-11-05 DIAGNOSIS — I252 Old myocardial infarction: Secondary | ICD-10-CM | POA: Diagnosis not present

## 2019-11-05 DIAGNOSIS — M1990 Unspecified osteoarthritis, unspecified site: Secondary | ICD-10-CM | POA: Diagnosis not present

## 2019-11-05 DIAGNOSIS — Z823 Family history of stroke: Secondary | ICD-10-CM | POA: Diagnosis not present

## 2019-11-05 DIAGNOSIS — I160 Hypertensive urgency: Secondary | ICD-10-CM | POA: Diagnosis not present

## 2019-11-05 DIAGNOSIS — E7800 Pure hypercholesterolemia, unspecified: Secondary | ICD-10-CM | POA: Diagnosis present

## 2019-11-05 DIAGNOSIS — Z809 Family history of malignant neoplasm, unspecified: Secondary | ICD-10-CM | POA: Diagnosis not present

## 2019-11-05 DIAGNOSIS — H409 Unspecified glaucoma: Secondary | ICD-10-CM | POA: Diagnosis not present

## 2019-11-05 DIAGNOSIS — R079 Chest pain, unspecified: Secondary | ICD-10-CM | POA: Diagnosis not present

## 2019-11-05 DIAGNOSIS — I2510 Atherosclerotic heart disease of native coronary artery without angina pectoris: Secondary | ICD-10-CM | POA: Diagnosis not present

## 2019-11-05 DIAGNOSIS — I10 Essential (primary) hypertension: Secondary | ICD-10-CM | POA: Diagnosis not present

## 2019-11-05 DIAGNOSIS — G8929 Other chronic pain: Secondary | ICD-10-CM

## 2019-11-05 DIAGNOSIS — F419 Anxiety disorder, unspecified: Secondary | ICD-10-CM | POA: Diagnosis present

## 2019-11-05 DIAGNOSIS — E785 Hyperlipidemia, unspecified: Secondary | ICD-10-CM | POA: Diagnosis not present

## 2019-11-05 DIAGNOSIS — J449 Chronic obstructive pulmonary disease, unspecified: Secondary | ICD-10-CM | POA: Diagnosis not present

## 2019-11-05 DIAGNOSIS — I4891 Unspecified atrial fibrillation: Secondary | ICD-10-CM | POA: Diagnosis not present

## 2019-11-05 HISTORY — PX: RADIOLOGY WITH ANESTHESIA: SHX6223

## 2019-11-05 LAB — COMPREHENSIVE METABOLIC PANEL
ALT: 18 U/L (ref 0–44)
AST: 18 U/L (ref 15–41)
Albumin: 3.3 g/dL — ABNORMAL LOW (ref 3.5–5.0)
Alkaline Phosphatase: 45 U/L (ref 38–126)
Anion gap: 8 (ref 5–15)
BUN: 13 mg/dL (ref 8–23)
CO2: 23 mmol/L (ref 22–32)
Calcium: 9.2 mg/dL (ref 8.9–10.3)
Chloride: 109 mmol/L (ref 98–111)
Creatinine, Ser: 0.81 mg/dL (ref 0.44–1.00)
GFR calc Af Amer: >60 mL/min (ref >60)
GFR calc non Af Amer: >60 mL/min (ref >60)
Glucose, Bld: 94 mg/dL (ref 70–99)
Potassium: 3.8 mmol/L (ref 3.5–5.1)
Sodium: 140 mmol/L (ref 135–145)
Total Bilirubin: 0.6 mg/dL (ref 0.3–1.2)
Total Protein: 5.7 g/dL — ABNORMAL LOW (ref 6.5–8.1)

## 2019-11-05 LAB — URINE CULTURE: Culture: <10000 — AB

## 2019-11-05 LAB — CBC
HCT: 35.7 % — ABNORMAL LOW (ref 36.0–46.0)
Hemoglobin: 11.8 g/dL — ABNORMAL LOW (ref 12.0–15.0)
MCH: 31.1 pg (ref 26.0–34.0)
MCHC: 33.1 g/dL (ref 30.0–36.0)
MCV: 93.9 fL (ref 80.0–100.0)
Platelets: 193 10*3/uL (ref 150–400)
RBC: 3.8 MIL/uL — ABNORMAL LOW (ref 3.87–5.11)
RDW: 14.6 % (ref 11.5–15.5)
WBC: 6.3 10*3/uL (ref 4.0–10.5)
nRBC: 0 % (ref 0.0–0.2)

## 2019-11-05 LAB — LIPASE, BLOOD: Lipase: 35 U/L (ref 11–51)

## 2019-11-05 SURGERY — MRI WITH ANESTHESIA
Anesthesia: General

## 2019-11-05 MED ORDER — ONDANSETRON HCL 4 MG/2ML IJ SOLN
INTRAMUSCULAR | Status: DC | PRN
  Administered 2019-11-05: 4 mg via INTRAVENOUS

## 2019-11-05 MED ORDER — PHENYLEPHRINE HCL-NACL 10-0.9 MG/250ML-% IV SOLN
INTRAVENOUS | Status: DC | PRN
  Administered 2019-11-05: 40 ug/min via INTRAVENOUS

## 2019-11-05 MED ORDER — GADOBUTROL 1 MMOL/ML IV SOLN
5.0000 mL | Freq: Once | INTRAVENOUS | Status: AC | PRN
  Administered 2019-11-05: 5 mL via INTRAVENOUS

## 2019-11-05 MED ORDER — POLYETHYLENE GLYCOL 3350 17 G PO PACK
17.0000 g | PACK | Freq: Three times a day (TID) | ORAL | Status: AC
  Administered 2019-11-06: 17 g via ORAL
  Filled 2019-11-05 (×3): qty 1

## 2019-11-05 MED ORDER — DEXAMETHASONE SODIUM PHOSPHATE 10 MG/ML IJ SOLN
INTRAMUSCULAR | Status: DC | PRN
  Administered 2019-11-05: 10 mg via INTRAVENOUS

## 2019-11-05 MED ORDER — FENTANYL CITRATE (PF) 100 MCG/2ML IJ SOLN
INTRAMUSCULAR | Status: DC | PRN
Start: 2019-11-05 — End: 2019-11-05
  Administered 2019-11-05: 50 ug via INTRAVENOUS

## 2019-11-05 MED ORDER — LIDOCAINE 2% (20 MG/ML) 5 ML SYRINGE
INTRAMUSCULAR | Status: DC | PRN
  Administered 2019-11-05: 40 mg via INTRAVENOUS

## 2019-11-05 MED ORDER — POLYETHYLENE GLYCOL 3350 17 G PO PACK: 17.0000 g | PACK | Freq: Every day | ORAL | Status: DC

## 2019-11-05 MED ORDER — RANOLAZINE ER 500 MG PO TB12
500.0000 mg | ORAL_TABLET | Freq: Two times a day (BID) | ORAL | Status: DC
  Administered 2019-11-05 – 2019-11-08 (×5): 500 mg via ORAL
  Filled 2019-11-05 (×7): qty 1

## 2019-11-05 MED ORDER — POLYETHYLENE GLYCOL 3350 17 G PO PACK
17.0000 g | PACK | Freq: Two times a day (BID) | ORAL | Status: DC
  Administered 2019-11-05: 17 g via ORAL

## 2019-11-05 MED ORDER — PROPOFOL 10 MG/ML IV BOLUS
INTRAVENOUS | Status: DC | PRN
  Administered 2019-11-05: 100 mg via INTRAVENOUS

## 2019-11-05 MED ORDER — MIDAZOLAM HCL 5 MG/5ML IJ SOLN
INTRAMUSCULAR | Status: DC | PRN
  Administered 2019-11-05: 1 mg via INTRAVENOUS

## 2019-11-05 MED ORDER — POLYETHYLENE GLYCOL 3350 17 G PO PACK: 17.0000 g | PACK | Freq: Two times a day (BID) | ORAL | Status: DC

## 2019-11-05 MED ORDER — EPHEDRINE SULFATE-NACL 50-0.9 MG/10ML-% IV SOSY
PREFILLED_SYRINGE | INTRAVENOUS | Status: DC | PRN
  Administered 2019-11-05: 15 mg via INTRAVENOUS

## 2019-11-05 MED ORDER — LOSARTAN POTASSIUM 50 MG PO TABS
50.0000 mg | ORAL_TABLET | Freq: Every day | ORAL | Status: DC
  Administered 2019-11-05: 50 mg via ORAL
  Filled 2019-11-05: qty 1

## 2019-11-05 NOTE — Progress Notes (Addendum)
Progress Note  Patient Name: Leah Pittman Date of Encounter: 11/05/2019  [facility] Cardiologist: Thurmon Fair, MD  Subjective   Continue to have both chest pain and epigastric pain.   Inpatient Medications    Scheduled Meds:  aspirin EC  81 mg Oral Daily   dorzolamide-timolol  1 drop Left Eye BID   enoxaparin (LOVENOX) injection  40 mg Subcutaneous QHS   latanoprost  1 drop Both Eyes QHS   levothyroxine  100 mcg Oral QAC breakfast   linaclotide  145 mcg Oral QAC breakfast   losartan  50 mg Oral Daily   pantoprazole  40 mg Oral Daily   polyethylene glycol  17 g Oral BID   ranolazine  500 mg Oral BID   simvastatin  20 mg Oral QHS   sodium chloride flush  3 mL Intravenous Q12H   sodium chloride flush  3 mL Intravenous Q12H   Continuous Infusions:  sodium chloride     sodium chloride 75 mL/hr at 11/05/19 0249   PRN Meds: sodium chloride, acetaminophen **OR** acetaminophen, albuterol, bisacodyl, fentaNYL (SUBLIMAZE) injection, guaiFENesin-dextromethorphan, labetalol, LORazepam, ondansetron **OR** ondansetron (ZOFRAN) IV, senna-docusate, sodium chloride flush   Vital Signs    Vitals:   11/04/19 1935 11/04/19 1951 11/05/19 0225 11/05/19 0624  BP:  (!) 156/61 (!) 172/68 (!) 168/55  Pulse: (!) 56 (!) 55 64 (!) 59  Resp:  16 18 19   Temp:  98.4 F (36.9 C) 98 F (36.7 C) 98 F (36.7 C)  TempSrc:  Oral Oral Oral  SpO2: 99% 99% 99% 99%  Weight:    53.5 kg  Height:        Intake/Output Summary (Last 24 hours) at 11/05/2019 0831 Last data filed at 11/05/2019 0450 Gross per 24 hour  Intake 480.32 ml  Output 1750 ml  Net -1269.68 ml   Last 3 Weights 11/05/2019 11/03/2019 08/20/2019  Weight (lbs) 118 lb 118 lb 119 lb 12.8 oz  Weight (kg) 53.524 kg 53.524 kg 54.341 kg      Telemetry    NSR without significant ventricular ectopy - Personally Reviewed  ECG    NSR without significant ST-T wave changes - Personally Reviewed  Physical Exam   GEN: No acute  distress.   Neck: No JVD Cardiac: RRR, no murmurs, rubs, or gallops.  Respiratory: Clear to auscultation bilaterally. GI: Soft, nontender, non-distended  MS: No edema; No deformity. Neuro:  Nonfocal  Psych: Normal affect   Labs    High Sensitivity Troponin:   Recent Labs  Lab 11/03/19 1252 11/03/19 1528 11/03/19 2315 11/04/19 0046  TROPONINIHS 5 7 7 6       Chemistry Recent Labs  Lab 11/03/19 1252 11/03/19 1528 11/04/19 0617 11/05/19 0358  NA 139  --  138 140  K 4.2  --  3.6 3.8  CL 106  --  105 109  CO2 24  --  23 23  GLUCOSE 96  --  113* 94  BUN 14  --  14 13  CREATININE 0.91  --  0.80 0.81  CALCIUM 9.9  --  9.8 9.2  PROT  --  7.0 6.4* 5.7*  ALBUMIN  --  4.2 3.6 3.3*  AST  --  42* 21 18  ALT  --  21 19 18   ALKPHOS  --  53 52 45  BILITOT  --  1.0 0.3 0.6  GFRNONAA 57*  --  >60 >60  GFRAA >60  --  >60 >60  ANIONGAP 9  --  10 8     Hematology Recent Labs  Lab 11/03/19 1252 11/04/19 0617 11/05/19 0358  WBC 6.9 8.0 6.3  RBC 4.11 3.99 3.80*  HGB 12.6 12.4 11.8*  HCT 39.3 36.9 35.7*  MCV 95.6 92.5 93.9  MCH 30.7 31.1 31.1  MCHC 32.1 33.6 33.1  RDW 14.4 14.4 14.6  PLT 218 205 193    BNP Recent Labs  Lab 11/03/19 1528  BNP 149.8*     DDimer No results for input(s): DDIMER in the last 168 hours.   Radiology    DG Chest 2 View  Result Date: 11/03/2019 CLINICAL DATA:  Chest pain. EXAM: CHEST - 2 VIEW COMPARISON:  Chest x-ray dated July 24, 2019. FINDINGS: The heart size and mediastinal contours are within normal limits. Prior CABG. Normal pulmonary vascularity. No focal consolidation, pleural effusion, or pneumothorax. No acute osseous abnormality. IMPRESSION: No active cardiopulmonary disease. Electronically Signed   By: Obie Dredge M.D.   On: 11/03/2019 13:27   CT Angio Chest/Abd/Pel for Dissection W and/or Wo Contrast  Result Date: 11/03/2019 CLINICAL DATA:  Chest pain and back pain, aortic dissection suspected. Blood pressure over 225.  EXAM: CT ANGIOGRAPHY CHEST, ABDOMEN AND PELVIS TECHNIQUE: Non-contrast CT of the chest was initially obtained. Multidetector CT imaging through the chest, abdomen and pelvis was performed using the standard protocol during bolus administration of intravenous contrast. Multiplanar reconstructed images and MIPs were obtained and reviewed to evaluate the vascular anatomy. "Pt allergic to contrast- had 4 hour prep ( but is allergic to benedryl and prednisone so used alternate meds.)" CONTRAST:  OMNIPAQUE IOHEXOL 350 MG/ML SOLN COMPARISON:  CT coronary 05/06/2018, CT angio chest 04/22/2019, CT abdomen pelvis 07/13/2017, CT angio chest 01/30/2019. FINDINGS: CTA CHEST FINDINGS Cardiovascular: Preferential opacification of the thoracic aorta. Similar-appearing crescentic peripheral hypodensity along the aortic arch that originates just distal to left subclavian artery (10:55-57, 11:79-82) and extends to the origin of the descending thoracic aorta. This finding is present on prior CT chest 04/22/2019 and 01/30/2019; however, is very poorly visualized and evaluated on the prior studies due to non-optimization for the visualization of the aorta on these prior studies. No evidence of thoracic aortic aneurysm. No periaortic fat stranding. Normal heart size. No significant pericardial effusion. The main pulmonary artery is normal in caliber. No central or segmental pulmonary embolus. At least 4 vessel mild coronary artery calcifications in a patient status post coronary artery bypass graft. Mediastinum/Nodes: Calcified right hilar lymph node (7:78). Prominent but nonenlarged left hilar lymph node (7:70). No enlarged mediastinal, hilar, or axillary lymph nodes. Thyroid gland, trachea, and esophagus demonstrate no significant findings. Lungs/Pleura: Few scattered calcified granuloma. Biapical pleural/pulmonary scarring. Few scattered pulmonary micro nodules. Right basilar 5 mm pulmonary nodule (7:129). Left basilar 4 mm  pulmonary nodule (7:125). No pulmonary mass. No focal consolidation. No pleural effusion or pneumothorax. Musculoskeletal: No chest wall abnormality. No acute or significant osseous findings. No acute displaced fracture. Review of the MIP images confirms the above findings. CTA ABDOMEN AND PELVIS FINDINGS VASCULAR Aorta: Severe calcified and noncalcified atherosclerotic plaque. Normal caliber aorta without aneurysm, dissection, vasculitis or significant stenosis. No periaortic fat stranding. Celiac: Patent without evidence of aneurysm, dissection, vasculitis or significant stenosis. SMA: Patent without evidence of aneurysm, dissection, vasculitis or significant stenosis. Replaced right hepatic artery originating off of the superior mesenteric artery. Renals: The right renal artery is diminutive in size compared to the left. Both renal arteries are patent without evidence of aneurysm, dissection, vasculitis, fibromuscular dysplasia or significant  stenosis. IMA: Patent without evidence of aneurysm, dissection, vasculitis or significant stenosis. Iliacs: Mild to moderate calcified and noncalcified atherosclerotic plaque. Patent without evidence of aneurysm, dissection, vasculitis or significant stenosis. Veins: No obvious venous abnormality within the limitations of this arterial phase study. Other: Mild soft tissue density along the right femoral vessels likely scarring for prior access. No aneurysm identified at this level. Review of the MIP images confirms the above findings. NON-VASCULAR Hepatobiliary: No focal liver abnormality is seen. Hypodense appearance of the hepatic parenchyma mid to the splenic parenchyma likely due to timing of contrast. Status post cholecystectomy. No biliary dilatation. Pancreas: Diffusely atrophic. Persistent main pancreatic duct dilatation within the head and neck of the pancreas measuring up to 5 mm. Spleen: Normal in size without focal abnormality. Adrenals/Urinary Tract: Adrenal  glands are unremarkable. Kidneys are normal, without renal calculi, focal lesion, or hydronephrosis. Urinary bladder is unremarkable. Stomach/Bowel: Stomach is within normal limits. The appendix is not definitely identified. No evidence of bowel wall thickening, distention, or inflammatory changes. Diffuse sigmoid diverticulosis. Lymphatic: No abdominal, pelvic, inguinal lymphadenopathy. Reproductive: Status post hysterectomy. No adnexal masses. Other: No abdominal wall hernia or abnormality. No abdominopelvic ascites. No free intraperitoneal gas. No organized fluid collection. Musculoskeletal: No acute or significant osseous findings. Review of the MIP images confirms the above findings. IMPRESSION: 1. Similar-appearing peripheral crescentic hypodensity along the aortic arch that originates just distal to the left subclavian artery and terminates at the origin of the descending thoracic aorta that may represent a stable type B aortic dissection versus noncalcified thrombus in a patient with severe calcified and noncalcified atherosclerotic plaque of the aorta and its branches. No aorta aneurysm or intramural hematoma. 2. Persistent main pancreatic duct dilatation up to 5 mm within the head and neck of the pancreas. Findings may represent intraductal papillary mucinous neoplasm. 3. Couple of bibasilar pulmonary nodules measuring up to 5 mm in a patient demonstrating sequela of prior granulomatous disease. These results were called by telephone at the time of interpretation on 11/03/2019 at 9:20 pm to provider Dr. Jacqulyn Bath, who verbally acknowledged these results. Electronically Signed   By: Tish Frederickson M.D.   On: 11/03/2019 21:18    Cardiac Studies   Echo 07/25/2019 1. Abnormal septal motion inferior basal hypokinesis . Left ventricular  ejection fraction, by estimation, is 50 to 55%. The left ventricle has low  normal function. The left ventricle demonstrates regional wall motion  abnormalities (see scoring    diagram/findings for description). Left ventricular diastolic parameters  are consistent with age-related delayed relaxation (normal).   2. Right ventricular systolic function is normal. The right ventricular  size is normal.   3. The mitral valve is normal in structure. Trivial mitral valve  regurgitation. No evidence of mitral stenosis.   4. The aortic valve is tricuspid. Aortic valve regurgitation is trivial.  Mild aortic valve sclerosis is present, with no evidence of aortic valve  stenosis.   5. The inferior vena cava is normal in size with greater than 50%  respiratory variability, suggesting right atrial pressure of 3 mmHg.   Cath 07/25/2019 Prox RCA to Mid RCA lesion is 40% stenosed. Ost LM to Mid LM lesion is 60% stenosed. Mid Cx lesion is 99% stenosed. SVG graft was visualized by angiography and is normal in caliber. The graft exhibits no disease. SVG graft was visualized by angiography and is normal in caliber. The graft exhibits no disease. Mid LAD lesion is 70% stenosed.   Severe double vessel CAD Moderately  severe ostial left main stenosis Severe stenosis mid LAD. The SVG to the LAD is patent. The LIMA does not appear to have been used despite the findings in the operative report. The LIMA is small and atretic.  Severe stenosis mid Circumflex. OM and OM2 fill from the patent vein graft.  Mild disease mid RCA   Recommendations: Continue medical management of CAD. Will start lactulose for constipation. She could be discharged later today after bedrest if she is stable.    Patient Profile     86 y.o. female with PMH of CAD s/p CABG in May 2020 (repeat cath 07/2018 showed patent SVG x 2 and atretic LIMA), HTN, and HLD admitted with chest and epigastric pain  Assessment & Plan    1. CAD s/p CABG  - Recent cath in May 2020 showed atretic LIMA, patent SVG x2, medical therapy recommended.   - Chest pain is worse with palpation. Per patient chest pain has been going on  since CABG in May 2020, unclear if due to poorly healed sternotomy site.   - no plan for further cardiac workup given non-cardiac chest pain  2. Type B dissection  - no surgery planned.   3. Epigastric pain: refused EUS. GI following, possible MRCP  4. HTN: labile BP ranges between 110-160s, continue current therapy.   5. Postop afib: remote, no recurrence.       For questions or updates, please contact [facility] Please consult [email or web address] for contact info under        Signed, Azalee CourseHao Meng, PA  11/05/2019, 8:31 AM    I have seen and examined the patient along with Azalee CourseHao Meng, PA .  I have reviewed the chart, notes and new data.  I agree with their note.  Key new complaints: unchanged epigastric discomfort and chest wall tenderness Key examination changes: normal CV exam. SBP is mildly elevated, wide pulse pressure. Key new findings / data: reviewed recent echo and recent left heart cath  PLAN: No plan for additional CV workup at this time. Awaiting MRCP. Intolerant to beta blockers due to bradycardia. Hard to"fix" her SBP elevation without dizziness from low DBP. Will increase the losartan dose further.  Thurmon FairMihai Cleo Villamizar, MD, [facility]ACC [facility] [phone] 11/05/2019, 9:56 AM

## 2019-11-05 NOTE — Progress Notes (Signed)
Patient had multiple BMs on BSC. Patient states that she feels better.  Full liquid diet started.

## 2019-11-05 NOTE — Anesthesia Procedure Notes (Signed)
Procedure Name: LMA Insertion Date/Time: 11/05/2019 10:46 AM Performed by: Rosiland Oz, CRNA Pre-anesthesia Checklist: Patient identified, Emergency Drugs available, Suction available, Patient being monitored and Timeout performed Patient Re-evaluated:Patient Re-evaluated prior to induction Oxygen Delivery Method: Circle system utilized Preoxygenation: Pre-oxygenation with 100% oxygen Induction Type: IV induction LMA: LMA inserted LMA Size: 3.0 Number of attempts: 1 Placement Confirmation: breath sounds checked- equal and bilateral,  ETT inserted through vocal cords under direct vision and positive ETCO2 Tube secured with: Tape Dental Injury: Teeth and Oropharynx as per pre-operative assessment

## 2019-11-05 NOTE — Anesthesia Preprocedure Evaluation (Addendum)
Anesthesia Evaluation  Patient identified by MRN, date of birth, ID band Patient awake    Airway Mallampati: II  TM Distance: >3 FB Neck ROM: Full    Dental no notable dental hx. (+) Teeth Intact, Caps   Pulmonary shortness of breath and with exertion, COPD,  Hx/o hoarseness to voice   Pulmonary exam normal breath sounds clear to auscultation       Cardiovascular hypertension, Pt. on medications and Pt. on home beta blockers + CAD and + CABG  Normal cardiovascular exam+ dysrhythmias Atrial Fibrillation  Rhythm:Regular Rate:Normal  CABG x 4 07/10/18  Type B Dissection of Thoracic aorta  Echo 07/25/19 1. Abnormal septal motion inferior basal hypokinesis . Left ventricular ejection fraction, by estimation, is 50 to 55%. The left ventricle has low normal function. The left ventricle demonstrates regional wall motion abnormalities (see scoring diagram/findings for description). Left ventricular diastolic parameters are consistent with age-related delayed relaxation (normal).  2. Right ventricular systolic function is normal. The right ventricular size is normal.  3. The mitral valve is normal in structure. Trivial mitral valve regurgitation. No evidence of mitral stenosis.  4. The aortic valve is tricuspid. Aortic valve regurgitation is trivial. Mild aortic valve sclerosis is present, with no evidence of aortic valve stenosis.  5. The inferior vena cava is normal in size with greater than 50% respiratory variability, suggesting right atrial pressure of 3   EKG 11/05/2019 NSR, non specific ST- Twave changes  Cardiac Cath 07/25/19  Prox RCA to Mid RCA lesion is 40% stenosed.  Ost LM to Mid LM lesion is 60% stenosed.  Mid Cx lesion is 99% stenosed.  SVG graft was visualized by angiography and is normal in caliber.  The graft exhibits no disease.  SVG graft was visualized by angiography and is normal in caliber.  The graft exhibits  no disease.  Mid LAD lesion is 70% stenosed.      Neuro/Psych  Headaches, Anxiety Glaucoma    GI/Hepatic Neg liver ROS, hiatal hernia, Dilated pancreatic duct   Endo/Other  Hypothyroidism Hyperlipidemia  Renal/GU negative Renal ROS   Hx/o cystitis    Musculoskeletal  (+) Arthritis , Osteoarthritis,    Abdominal   Peds  Hematology  (+) anemia ,   Anesthesia Other Findings   Reproductive/Obstetrics                          Anesthesia Physical Anesthesia Plan  ASA: III  Anesthesia Plan: General   Post-op Pain Management:    Induction: Intravenous  PONV Risk Score and Plan: Treatment may vary due to age or medical condition  Airway Management Planned: LMA  Additional Equipment:   Intra-op Plan:   Post-operative Plan:   Informed Consent: I have reviewed the patients History and Physical, chart, labs and discussed the procedure including the risks, benefits and alternatives for the proposed anesthesia with the patient or authorized representative who has indicated his/her understanding and acceptance.   Patient has DNR.  Discussed DNR with patient and Suspend DNR.   Dental advisory given  Plan Discussed with: CRNA and Anesthesiologist  Anesthesia Plan Comments:         Anesthesia Quick Evaluation

## 2019-11-05 NOTE — Anesthesia Postprocedure Evaluation (Signed)
Anesthesia Post Note  Patient: Leah Pittman  Procedure(s) Performed: MRI WITH ANESTHESIA (N/A )     Patient location during evaluation: PACU Anesthesia Type: General Level of consciousness: awake and alert and oriented Pain management: pain level controlled Vital Signs Assessment: post-procedure vital signs reviewed and stable Respiratory status: spontaneous breathing, nonlabored ventilation and respiratory function stable Cardiovascular status: blood pressure returned to baseline and stable Postop Assessment: no apparent nausea or vomiting Anesthetic complications: no   No complications documented.  Last Vitals:  Vitals:   11/05/19 1155 11/05/19 1210  BP: (!) 182/71 (!) 161/58  Pulse: 70 65  Resp: 14 16  Temp: 36.4 C (!) 36.1 C  SpO2: 99% 97%    Last Pain:  Vitals:   11/05/19 1210  TempSrc:   PainSc: 0-No pain                 [name] A.

## 2019-11-05 NOTE — Progress Notes (Addendum)
Daily Rounding Note  11/05/2019, 2:23 PM  LOS: 0 days   SUBJECTIVE:   Chief complaint:    cacute on chronic Nausea, epigastric and chest pain Still feeling epigastric distress  OBJECTIVE:         Vital signs in last 24 hours:    Temp:  [97 F (36.1 C)-98.4 F (36.9 C)] 97.3 F (36.3 C) (09/15 1240) Pulse Rate:  [55-70] 66 (09/15 1240) Resp:  [14-19] 16 (09/15 1240) BP: (154-182)/(55-71) 172/65 (09/15 1240) SpO2:  [97 %-100 %] 100 % (09/15 1240) Weight:  [53.5 kg] 53.5 kg (09/15 0624) Last BM Date: 11/01/19 Filed Weights   11/03/19 1254 11/05/19 0624  Weight: 53.5 kg 53.5 kg   General: flat affect.  Alert.     Heart: RRR Chest: clear Abdomen: soft, ND.  Minor epigastric tenderness  Extremities: no CCE Neuro/Psych:  Oriented x 3.  Alert.   Intake/Output from previous day: 09/14 0701 - 09/15 0700 In: 480.3 [P.O.:240; I.V.:240.3] Out: 1750 [Urine:1750]  Intake/Output this shift: Total I/O In: 500 [I.V.:500] Out: -   Lab Results: Recent Labs    11/03/19 1252 11/04/19 0617 11/05/19 0358  WBC 6.9 8.0 6.3  HGB 12.6 12.4 11.8*  HCT 39.3 36.9 35.7*  PLT 218 205 193   BMET Recent Labs    11/03/19 1252 11/04/19 0617 11/05/19 0358  NA 139 138 140  K 4.2 3.6 3.8  CL 106 105 109  CO2 24 23 23   GLUCOSE 96 113* 94  BUN 14 14 13   CREATININE 0.91 0.80 0.81  CALCIUM 9.9 9.8 9.2   LFT Recent Labs    11/03/19 1528 11/04/19 0617 11/05/19 0358  PROT 7.0 6.4* 5.7*  ALBUMIN 4.2 3.6 3.3*  AST 42* 21 18  ALT 21 19 18   ALKPHOS 53 52 45  BILITOT 1.0 0.3 0.6  BILIDIR 0.5* <0.1  --   IBILI 0.5 NOT CALCULATED  --    PT/INR Recent Labs    11/03/19 1648  LABPROT 12.6  INR 1.0   Hepatitis Panel No results for input(s): HEPBSAG, HCVAB, HEPAIGM, HEPBIGM in the last 72 hours.  Studies/Results: MR 3D Recon At Scanner MR ABDOMEN MRCP W WO CONTAST  Result Date: 9/15/202FINDINGS: Lower chest:  Incidental imaging of the lung bases without consolidation or sign of pleural effusion. CT Hepatobiliary: Biliary duct distension following cholecystectomy is stable compared to studies from 2019. No focal, suspicious hepatic lesion portal vein and hepatic veins are patent. Mild hepatic steatosis. Pancreas: Stable dilation of the main pancreatic duct particularly in the head of the pancreas. No focal, suspicious pancreatic lesion with stable pancreatic contour dating back to 2019. Normal intrinsic T1 signal in pancreas which shows generalized, mild atrophy. Spleen:  Within normal limits in size and appearance. Adrenals/Urinary Tract: Adrenal glands are normal. Kidneys enhance symmetrically without focal, suspicious renal lesion. Stomach/Bowel: Gastrointestinal track showing question of mild colonic thickening and colonic wall edema. Stool in the colon limiting assessment. There is also subtle ahaustral change in the colon, this is subtle on today's study. And may be related to artifact in the setting of stool filled colonic loops Vascular/Lymphatic: Abdominal aortic atherosclerotic changes without aneurysmal dilation of the abdominal aorta Other:  No ascites. Musculoskeletal: Post sternotomy. IMPRESSION: 1. Stable dilation of the main pancreatic duct particularly in the head of the pancreas. No focal, suspicious pancreatic lesion with stable contour dating back to 2019. No signs of pancreatic inflammation to indicate pancreatitis. Given main  duct distension would consider a 12 month follow-up either with CT or MRI. 2. Biliary duct distension following cholecystectomy is stable compared to studies from 2019. 3. Ahaustral appearance of the colon is suggested in some areas along with mild colonic wall thickening. Correlate with any clinical signs of colitis, this could be due to artifact. 4. Mild hepatic steatosis. Electronically Signed   By: Donzetta Kohut M.D.   On: 11/05/2019 12:21   CT Angio Chest/Abd/Pel for  Dissection W and/or Wo Contrast  Result Date: 11/03/2019 FINDINGS Cardiovascular: Preferential opacification of the thoracic aorta. Similar-appearing crescentic peripheral hypodensity along the aortic arch that originates just distal to left subclavian artery (10:55-57, 11:79-82) and extends to the origin of the descending thoracic aorta. This finding is present on prior CT chest 04/22/2019 and 01/30/2019; however, is very poorly visualized and evaluated on the prior studies due to non-optimization for the visualization of the aorta on these prior studies. No evidence of thoracic aortic aneurysm. No periaortic fat stranding. Normal heart size. No significant pericardial effusion. The main pulmonary artery is normal in caliber. No central or segmental pulmonary embolus. At least 4 vessel mild coronary artery calcifications in a patient status post coronary artery bypass graft. Mediastinum/Nodes: Calcified right hilar lymph node (7:78). Prominent but nonenlarged left hilar lymph node (7:70). No enlarged mediastinal, hilar, or axillary lymph nodes. Thyroid gland, trachea, and esophagus demonstrate no significant findings. Lungs/Pleura: Few scattered calcified granuloma. Biapical pleural/pulmonary scarring. Few scattered pulmonary micro nodules. Right basilar 5 mm pulmonary nodule (7:129). Left basilar 4 mm pulmonary nodule (7:125). No pulmonary mass. No focal consolidation. No pleural effusion or pneumothorax. Musculoskeletal: No chest wall abnormality. No acute or significant osseous findings. No acute displaced fracture. Review of the MIP images confirms the above findings. CTA ABDOMEN AND PELVIS FINDINGS VASCULAR Aorta: Severe calcified and noncalcified atherosclerotic plaque. Normal caliber aorta without aneurysm, dissection, vasculitis or significant stenosis. No periaortic fat stranding. Celiac: Patent without evidence of aneurysm, dissection, vasculitis or significant stenosis. SMA: Patent without evidence of  aneurysm, dissection, vasculitis or significant stenosis. Replaced right hepatic artery originating off of the superior mesenteric artery. Renals: The right renal artery is diminutive in size compared to the left. Both renal arteries are patent without evidence of aneurysm, dissection, vasculitis, fibromuscular dysplasia or significant stenosis. IMA: Patent without evidence of aneurysm, dissection, vasculitis or significant stenosis. Iliacs: Mild to moderate calcified and noncalcified atherosclerotic plaque. Patent without evidence of aneurysm, dissection, vasculitis or significant stenosis. Veins: No obvious venous abnormality within the limitations of this arterial phase study. Other: Mild soft tissue density along the right femoral vessels likely scarring for prior access. No aneurysm identified at this level. Review of the MIP images confirms the above findings. NON-VASCULAR Hepatobiliary: No focal liver abnormality is seen. Hypodense appearance of the hepatic parenchyma mid to the splenic parenchyma likely due to timing of contrast. Status post cholecystectomy. No biliary dilatation. Pancreas: Diffusely atrophic. Persistent main pancreatic duct dilatation within the head and neck of the pancreas measuring up to 5 mm. Spleen: Normal in size without focal abnormality. Adrenals/Urinary Tract: Adrenal glands are unremarkable. Kidneys are normal, without renal calculi, focal lesion, or hydronephrosis. Urinary bladder is unremarkable. Stomach/Bowel: Stomach is within normal limits. The appendix is not definitely identified. No evidence of bowel wall thickening, distention, or inflammatory changes. Diffuse sigmoid diverticulosis. Lymphatic: No abdominal, pelvic, inguinal lymphadenopathy. Reproductive: Status post hysterectomy. No adnexal masses. Other: No abdominal wall hernia or abnormality. No abdominopelvic ascites. No free intraperitoneal gas. No organized fluid  collection. Musculoskeletal: No acute or significant  osseous findings. Review of the MIP images confirms the above findings. IMPRESSION: 1. Similar-appearing peripheral crescentic hypodensity along the aortic arch that originates just distal to the left subclavian artery and terminates at the origin of the descending thoracic aorta that may represent a stable type B aortic dissection versus noncalcified thrombus in a patient with severe calcified and noncalcified atherosclerotic plaque of the aorta and its branches. No aorta aneurysm or intramural hematoma. 2. Persistent main pancreatic duct dilatation up to 5 mm within the head and neck of the pancreas. Findings may represent intraductal papillary mucinous neoplasm. 3. Couple of bibasilar pulmonary nodules measuring up to 5 mm in a patient demonstrating sequela of prior granulomatous disease. These results were called by telephone at the time of interpretation on 11/03/2019 at 9:20 pm to provider Dr. Jacqulyn Bath, who verbally acknowledged these results. Electronically Signed   By: Tish Frederickson M.D.   On: 11/03/2019 21:18   Scheduled Meds: . aspirin EC  81 mg Oral Daily  . dorzolamide-timolol  1 drop Left Eye BID  . enoxaparin (LOVENOX) injection  40 mg Subcutaneous QHS  . latanoprost  1 drop Both Eyes QHS  . levothyroxine  100 mcg Oral QAC breakfast  . linaclotide  145 mcg Oral QAC breakfast  . losartan  50 mg Oral Daily  . pantoprazole  40 mg Oral Daily  . polyethylene glycol  17 g Oral TID  . ranolazine  500 mg Oral BID  . simvastatin  20 mg Oral QHS  . sodium chloride flush  3 mL Intravenous Q12H  . sodium chloride flush  3 mL Intravenous Q12H   Continuous Infusions: . sodium chloride    . sodium chloride 50 mL/hr at 11/05/19 1255   PRN Meds:.sodium chloride, acetaminophen **OR** acetaminophen, albuterol, bisacodyl, fentaNYL (SUBLIMAZE) injection, guaiFENesin-dextromethorphan, labetalol, LORazepam, ondansetron **OR** ondansetron (ZOFRAN) IV, senna-docusate, sodium chloride flush  ASSESMENT:   *    Acute on chronic epigastric abd pain and nausea.  On protonix 40 mg/daily Suspect functional etiology playing a signif role in sxs present intermittently for > 10 years.  Pt has hx of anxiety and appears depressed.     *   IBS, constipation predominant.    *   Pancreatic atrophy and dilated PD to 5 mm in region of head and neck, ? Intraductal papillary mucinous neoplasm per CT MRCP: stable main PD dilation dating to 2019, no mass.  Stable biliary distention post cholecystectomy.  Mild hepatic steatosis. Sugg of ahaustral and colon wall thickening in some segments.       PLAN   *  Plan repeat CT vs MRI in 10/2020 for surveillance of PD.    *   ? Anti-depressant w anxiolytic overlay. ? Trial of hyoscyamine or other anti-spasmodic, though w her multiple somatic complaints this may back fire and cause urinary retention, dry mouth etc.      *   Pt declines offer for upper endoscopy.  Unlikely to provide definitive dx for her worsening but chronic long-term ugi complaints.      Leah Pittman  11/05/2019, 2:23 PM Phone [phone]   Attending physician's note   I have taken an interval history, reviewed the chart and examined the patient. I agree with the Advanced Practitioner's note, impression and recommendations.   MRCP showed dilated pancreatic duct with no focal suspicious lesions, mild pancreatic atrophy.  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, recommended follow-up CT or MRI pancreas  in 12 months  Epigastric discomfort and dyspepsia symptoms improving  She feels better after she had bowel movement subsequent to laxatives  Continue MiraLAX once or twice daily with goal of 1-2 soft bowel movements daily  Small frequent meals  Dicyclomine 10mg  q8h prn for severe abdominal cramping/discomfort  Follow-up in GI office next available appointment with Dr Chales AbrahamsGupta  GI will sign off, available if have any questions   I have spent 35 minutes of patient care (this  includes precharting, chart review, review of results, face-to-face time used for counseling as well as treatment plan and follow-up. The patient was provided an opportunity to ask questions and all were answered. The patient agreed with the plan and demonstrated an understanding of the instructions.  Iona BeardK. Veena Dalyah Pla , MD [phone]

## 2019-11-05 NOTE — Progress Notes (Signed)
[facility] PROGRESS NOTE    Progress Note  Leah Pittman  GNF:[id number] DOB: 09-17-1930 DOA: 11/03/2019 PCP: Blane Oharaox, [name], MD     Brief Narrative:   Leah Pittman is an 86 y.o. female past medical history significant for CAD status post CABG in May 2020, with a recent cath essential hypertension presents to the ED for chest pain.  She relates exertional chest pain but no improvement with rest.  Upon arrival to the ED chest x-ray was negative for cardiopulmonary disease abdominal x-ray was notable for possible type B aortic dissection she was started on IV labetalol and hydralazine cardiology was consulted  Assessment/Plan:   Chest pain/  Coronary artery disease involving native coronary artery of native heart without angina pectoris: Blood pressure extremely elevated, troponins negative no acute findings on ECG. CT angio of chest and abdomen on 11/04/2019 showed hypodensity along the aortic arc may represent a type B aortic dissection with a noncalcified thrombus, is unchanged from previous studies.  No aneurysmal or hematoma.  And persistent pancreatic main duct dilation of the 5 mm of the head and neck of the pancreas concerning for neoplasm. Cardiology was consulted they relate symptoms noncardiogenic.  Hypertensive  crisis: With a blood pressure of 229/120 on admission she was started on IV labetalol hydralazine. Home dose of losartan her blood pressure is slightly improved but this morning is slightly trending up. Cannot start her on a beta-blocker as her heart rate is in the 50s and 60s. We will continue to titrate up losartan.  Epigastric tenderness/pancreatic duct dilation: With CT showing extensive atherosclerosis and atrophy of the pancreas with dilated duct concerning for neoplasm.  But no evidence of inflammatory disease. She related that her symptoms have escalated over the last several months with postprandial pain nausea vomiting and early satiety. GI was  consulted they the pancreatic abnormality unlikely to cause her symptoms but there has been some changes over the last few years making it possible they recommended EUS but the patient declined any endoscopic or anesthetic procedures.  The patient has agreed to MRCP with general anesthesi we will go ahead, n.p.o. and ask radiology to do MRCP with general anesthesia. Started on MiraLAX po BID and Colace.  Anxiety: Continue Ativan as needed.  Hypothyroidism: With a TSH of 6.4 free T4 of 1.1, will have to be repeated in 6 weeks free T4 and TSH as an outpatient.  COPD: Appears to be stable continue inhalers.    DVT prophylaxis: lovenxo Family Communication:none Status is: Observation  The patient remains OBS appropriate and will d/c before 2 midnights.  Dispo: The patient is from: Home              Anticipated d/c is to: Home              Anticipated d/c date is: 3 days              Patient currently is not medically stable to d/c.        Code Status:     Code Status Orders  (From admission, onward)         Start     Ordered   11/03/19 2235  Do not attempt resuscitation (DNR)  Continuous       Question Answer Comment  In the event of cardiac or respiratory ARREST Do not call a "code blue"   In the event of cardiac or respiratory ARREST Do not perform Intubation, CPR, defibrillation or ACLS   In  the event of cardiac or respiratory ARREST Use medication by any route, position, wound care, and other measures to relive pain and suffering. May use oxygen, suction and manual treatment of airway obstruction as needed for comfort.      11/03/19 2239        Code Status History    Date Active Date Inactive Code Status Order ID Comments User Context   07/25/2019 0018 07/27/2019 2131 DNR [id number]  John Giovanni, MD ED   07/24/2019 2335 07/25/2019 0018 Full Code [id number]  John Giovanni, MD ED   07/08/2018 1011 07/19/2018 2153 Full Code [id number]  Runell Gess, MD  Inpatient   Advance Care Planning Activity        IV Access:    Peripheral IV   Procedures and diagnostic studies:   DG Chest 2 View  Result Date: 11/03/2019 CLINICAL DATA:  Chest pain. EXAM: CHEST - 2 VIEW COMPARISON:  Chest x-ray dated July 24, 2019. FINDINGS: The heart size and mediastinal contours are within normal limits. Prior CABG. Normal pulmonary vascularity. No focal consolidation, pleural effusion, or pneumothorax. No acute osseous abnormality. IMPRESSION: No active cardiopulmonary disease. Electronically Signed   By: Obie Dredge M.D.   On: 11/03/2019 13:27   CT Angio Chest/Abd/Pel for Dissection W and/or Wo Contrast  Result Date: 11/03/2019 CLINICAL DATA:  Chest pain and back pain, aortic dissection suspected. Blood pressure over 225. EXAM: CT ANGIOGRAPHY CHEST, ABDOMEN AND PELVIS TECHNIQUE: Non-contrast CT of the chest was initially obtained. Multidetector CT imaging through the chest, abdomen and pelvis was performed using the standard protocol during bolus administration of intravenous contrast. Multiplanar reconstructed images and MIPs were obtained and reviewed to evaluate the vascular anatomy. "Pt allergic to contrast- had 4 hour prep ( but is allergic to benedryl and prednisone so used alternate meds.)" CONTRAST:  OMNIPAQUE IOHEXOL 350 MG/ML SOLN COMPARISON:  CT coronary 05/06/2018, CT angio chest 04/22/2019, CT abdomen pelvis 07/13/2017, CT angio chest 01/30/2019. FINDINGS: CTA CHEST FINDINGS Cardiovascular: Preferential opacification of the thoracic aorta. Similar-appearing crescentic peripheral hypodensity along the aortic arch that originates just distal to left subclavian artery (10:55-57, 11:79-82) and extends to the origin of the descending thoracic aorta. This finding is present on prior CT chest 04/22/2019 and 01/30/2019; however, is very poorly visualized and evaluated on the prior studies due to non-optimization for the visualization of the aorta on these  prior studies. No evidence of thoracic aortic aneurysm. No periaortic fat stranding. Normal heart size. No significant pericardial effusion. The main pulmonary artery is normal in caliber. No central or segmental pulmonary embolus. At least 4 vessel mild coronary artery calcifications in a patient status post coronary artery bypass graft. Mediastinum/Nodes: Calcified right hilar lymph node (7:78). Prominent but nonenlarged left hilar lymph node (7:70). No enlarged mediastinal, hilar, or axillary lymph nodes. Thyroid gland, trachea, and esophagus demonstrate no significant findings. Lungs/Pleura: Few scattered calcified granuloma. Biapical pleural/pulmonary scarring. Few scattered pulmonary micro nodules. Right basilar 5 mm pulmonary nodule (7:129). Left basilar 4 mm pulmonary nodule (7:125). No pulmonary mass. No focal consolidation. No pleural effusion or pneumothorax. Musculoskeletal: No chest wall abnormality. No acute or significant osseous findings. No acute displaced fracture. Review of the MIP images confirms the above findings. CTA ABDOMEN AND PELVIS FINDINGS VASCULAR Aorta: Severe calcified and noncalcified atherosclerotic plaque. Normal caliber aorta without aneurysm, dissection, vasculitis or significant stenosis. No periaortic fat stranding. Celiac: Patent without evidence of aneurysm, dissection, vasculitis or significant stenosis. SMA: Patent without evidence of aneurysm, dissection, vasculitis  or significant stenosis. Replaced right hepatic artery originating off of the superior mesenteric artery. Renals: The right renal artery is diminutive in size compared to the left. Both renal arteries are patent without evidence of aneurysm, dissection, vasculitis, fibromuscular dysplasia or significant stenosis. IMA: Patent without evidence of aneurysm, dissection, vasculitis or significant stenosis. Iliacs: Mild to moderate calcified and noncalcified atherosclerotic plaque. Patent without evidence of aneurysm,  dissection, vasculitis or significant stenosis. Veins: No obvious venous abnormality within the limitations of this arterial phase study. Other: Mild soft tissue density along the right femoral vessels likely scarring for prior access. No aneurysm identified at this level. Review of the MIP images confirms the above findings. NON-VASCULAR Hepatobiliary: No focal liver abnormality is seen. Hypodense appearance of the hepatic parenchyma mid to the splenic parenchyma likely due to timing of contrast. Status post cholecystectomy. No biliary dilatation. Pancreas: Diffusely atrophic. Persistent main pancreatic duct dilatation within the head and neck of the pancreas measuring up to 5 mm. Spleen: Normal in size without focal abnormality. Adrenals/Urinary Tract: Adrenal glands are unremarkable. Kidneys are normal, without renal calculi, focal lesion, or hydronephrosis. Urinary bladder is unremarkable. Stomach/Bowel: Stomach is within normal limits. The appendix is not definitely identified. No evidence of bowel wall thickening, distention, or inflammatory changes. Diffuse sigmoid diverticulosis. Lymphatic: No abdominal, pelvic, inguinal lymphadenopathy. Reproductive: Status post hysterectomy. No adnexal masses. Other: No abdominal wall hernia or abnormality. No abdominopelvic ascites. No free intraperitoneal gas. No organized fluid collection. Musculoskeletal: No acute or significant osseous findings. Review of the MIP images confirms the above findings. IMPRESSION: 1. Similar-appearing peripheral crescentic hypodensity along the aortic arch that originates just distal to the left subclavian artery and terminates at the origin of the descending thoracic aorta that may represent a stable type B aortic dissection versus noncalcified thrombus in a patient with severe calcified and noncalcified atherosclerotic plaque of the aorta and its branches. No aorta aneurysm or intramural hematoma. 2. Persistent main pancreatic duct  dilatation up to 5 mm within the head and neck of the pancreas. Findings may represent intraductal papillary mucinous neoplasm. 3. Couple of bibasilar pulmonary nodules measuring up to 5 mm in a patient demonstrating sequela of prior granulomatous disease. These results were called by telephone at the time of interpretation on 11/03/2019 at 9:20 pm to provider Dr. Jacqulyn Bath, who verbally acknowledged these results. Electronically Signed   By: Tish Frederickson M.D.   On: 11/03/2019 21:18     Medical Consultants:    None.  Anti-Infectives:   none  Subjective:    Leah Pittman she continues to have abdominal pain which radiates to the back with nausea has not had a bowel movement in 5 days.  Objective:    Vitals:   11/04/19 1935 11/04/19 1951 11/05/19 0225 11/05/19 0624  BP:  (!) 156/61 (!) 172/68 (!) 168/55  Pulse: (!) 56 (!) 55 64 (!) 59  Resp:  Temp:  98.4 F (36.9 C) 98 F (36.7 C) 98 F (36.7 C)  TempSrc:  Oral Oral Oral  SpO2: 99% 99% 99% 99%  Weight:    53.5 kg  Height:       SpO2: 99 %   Intake/Output Summary (Last 24 hours) at 11/05/2019 0708 Last data filed at 11/05/2019 0450 Gross per 24 hour  Intake 480.32 ml  Output 1750 ml  Net -1269.68 ml   Filed Weights   11/03/19 1254 11/05/19 0624  Weight: 53.5 kg 53.5 kg    Exam: General exam:  In no acute distress. Respiratory system: Good air movement and clear to auscultation. Cardiovascular system: S1 & S2 heard, RRR. No JVD. Gastrointestinal system: Epigastric pain to palpation nondistended soft positive bowel sounds Extremities: No pedal edema. Skin: No rashes, lesions or ulcers Psychiatry: Judgement and insight appear normal. Mood & affect appropriate.    Data Reviewed:    Labs: Basic Metabolic Panel: Recent Labs  Lab 11/03/19 1252 11/03/19 1252 11/04/19 0617 11/05/19 0358  NA 139  --  138 140  K 4.2   < > 3.6 3.8  CL 106  --  105 109  CO2 24  --  23 23  GLUCOSE 96  --  113* 94  BUN  14  --  14 13  CREATININE 0.91  --  0.80 0.81  CALCIUM 9.9  --  9.8 9.2  MG  --   --  2.0  --    < > = values in this interval not displayed.   GFR Estimated Creatinine Clearance: 42.1 mL/min (by C-G formula based on SCr of 0.81 mg/dL). Liver Function Tests: Recent Labs  Lab 11/03/19 1528 11/04/19 0617 11/05/19 0358  AST 42* 21 18  ALT 21 19 18   ALKPHOS 53 52 45  BILITOT 1.0 0.3 0.6  PROT 7.0 6.4* 5.7*  ALBUMIN 4.2 3.6 3.3*   Recent Labs  Lab 11/03/19 1528 11/04/19 0617 11/05/19 0358  LIPASE 65* 32 35   No results for input(s): AMMONIA in the last 168 hours. Coagulation profile Recent Labs  Lab 11/03/19 1648  INR 1.0   COVID-19 Labs  No results for input(s): DDIMER, FERRITIN, LDH, CRP in the last 72 hours.  Lab Results  Component Value Date   SARSCOV2NAA NEGATIVE 11/03/2019   SARSCOV2NAA NEGATIVE 07/24/2019   SARSCOV2NAA NEGATIVE 07/17/2018   SARSCOV2NAA NEGATIVE 07/05/2018    CBC: Recent Labs  Lab 11/03/19 1252 11/04/19 0617 11/05/19 0358  WBC 6.9 8.0 6.3  HGB 12.6 12.4 11.8*  HCT 39.3 36.9 35.7*  MCV 95.6 92.5 93.9  PLT 218 205 193   Cardiac Enzymes: No results for input(s): CKTOTAL, CKMB, CKMBINDEX, TROPONINI in the last 168 hours. BNP (last 3 results) No results for input(s): PROBNP in the last 8760 hours. CBG: No results for input(s): GLUCAP in the last 168 hours. D-Dimer: No results for input(s): DDIMER in the last 72 hours. Hgb A1c: No results for input(s): HGBA1C in the last 72 hours. Lipid Profile: Recent Labs    11/04/19 0617  CHOL 206*  HDL 69  LDLCALC 120*  TRIG 86  CHOLHDL 3.0   Thyroid function studies: Recent Labs    11/03/19 1528  TSH 6.815*   Anemia work up: No results for input(s): VITAMINB12, FOLATE, FERRITIN, TIBC, IRON, RETICCTPCT in the last 72 hours. Sepsis Labs: Recent Labs  Lab 11/03/19 1252 11/04/19 0617 11/05/19 0358  WBC 6.9 8.0 6.3   Microbiology Recent Results (from the past 240 hour(s))    SARS Coronavirus 2 by RT PCR (hospital order, performed in [facility] hospital lab) Nasopharyngeal Nasopharyngeal Swab     Status: None   Collection Time: 11/03/19  3:34 PM   Specimen: Nasopharyngeal Swab  Result Value Ref Range Status   SARS Coronavirus 2 NEGATIVE NEGATIVE Final    Comment: (NOTE) SARS-CoV-2 target nucleic acids are NOT DETECTED.  The SARS-CoV-2 RNA is generally detectable in upper and lower respiratory specimens during the acute phase of infection. The lowest concentration of SARS-CoV-2 viral copies this assay can detect is 250 copies /  mL. A negative result does not preclude SARS-CoV-2 infection and should not be used as the sole basis for treatment or other patient management decisions.  A negative result may occur with improper specimen collection / handling, submission of specimen other than nasopharyngeal swab, presence of viral mutation(s) within the areas targeted by this assay, and inadequate number of viral copies (<250 copies / mL). A negative result must be combined with clinical observations, patient history, and epidemiological information.  Fact Sheet for Patients:   BoilerBrush.com.cy  Fact Sheet for Healthcare Providers: https://pope.com/  This test is not yet approved or  cleared by the [place] FDA and has been authorized for detection and/or diagnosis of SARS-CoV-2 by FDA under an Emergency Use Authorization (EUA).  This EUA will remain in effect (meaning this test can be used) for the duration of the COVID-19 declaration under Section 564(b)(1) of the Act, 21 U.S.C. section 360bbb-3(b)(1), unless the authorization is terminated or revoked sooner.  Performed at [facility], [address] [place], [place] [address]   Urine culture     Status: Abnormal   Collection Time: 11/03/19  3:46 PM   Specimen: Urine, Random  Result Value Ref Range Status   Specimen Description URINE,  RANDOM  Final   Special Requests NONE  Final   Culture (A)  Final    <10,000 COLONIES/mL INSIGNIFICANT GROWTH Performed at [facility], [address] [place], [place] [address]    Report Status 11/05/2019 FINAL  Final     Medications:   . aspirin EC  81 mg Oral Daily  . dorzolamide-timolol  1 drop Left Eye BID  . enoxaparin (LOVENOX) injection  40 mg Subcutaneous QHS  . latanoprost  1 drop Both Eyes QHS  . levothyroxine  100 mcg Oral QAC breakfast  . linaclotide  145 mcg Oral QAC breakfast  . losartan  25 mg Oral Daily  . pantoprazole  40 mg Oral Daily  . simvastatin  20 mg Oral QHS  . sodium chloride flush  3 mL Intravenous Q12H  . sodium chloride flush  3 mL Intravenous Q12H   Continuous Infusions: . sodium chloride    . sodium chloride 75 mL/hr at 11/05/19 0249      LOS: 0 days   Marinda Elk  [facility]  11/05/2019, 7:08 AM

## 2019-11-05 NOTE — Transfer of Care (Signed)
Immediate Anesthesia Transfer of Care Note  Patient: Leah Pittman  Procedure(s) Performed: MRI WITH ANESTHESIA (N/A )  Patient Location: PACU  Anesthesia Type:General  Level of Consciousness: drowsy and patient cooperative  Airway & Oxygen Therapy: Patient Spontanous Breathing  Post-op Assessment: Report given to RN and Post -op Vital signs reviewed and stable  Post vital signs: Reviewed and stable  Last Vitals:  Vitals Value Taken Time  BP 182/71 11/05/19 1152  Temp    Pulse 69 11/05/19 1153  Resp 14 11/05/19 1153  SpO2 99 % 11/05/19 1153  Vitals shown include unvalidated device data.  Last Pain:  Vitals:   11/05/19 0624  TempSrc: Oral  PainSc:       Patients Stated Pain Goal: 0 (11/04/19 1800)  Complications: No complications documented.

## 2019-11-05 NOTE — Progress Notes (Signed)
Patient given tap water enema, unable to hold enema, water running out immediately.  No results from enema, but clear water.  Patient did not want to continue tap water enema at this time.

## 2019-11-06 ENCOUNTER — Encounter (HOSPITAL_COMMUNITY): Payer: Self-pay | Admitting: Radiology

## 2019-11-06 LAB — CBC
HCT: 35.3 % — ABNORMAL LOW (ref 36.0–46.0)
Hemoglobin: 11.8 g/dL — ABNORMAL LOW (ref 12.0–15.0)
MCH: 31.7 pg (ref 26.0–34.0)
MCHC: 33.4 g/dL (ref 30.0–36.0)
MCV: 94.9 fL (ref 80.0–100.0)
Platelets: 207 10*3/uL (ref 150–400)
RBC: 3.72 MIL/uL — ABNORMAL LOW (ref 3.87–5.11)
RDW: 14.6 % (ref 11.5–15.5)
WBC: 9.1 10*3/uL (ref 4.0–10.5)
nRBC: 0 % (ref 0.0–0.2)

## 2019-11-06 MED ORDER — PANTOPRAZOLE SODIUM 40 MG PO TBEC
40.0000 mg | DELAYED_RELEASE_TABLET | Freq: Two times a day (BID) | ORAL | Status: DC
  Administered 2019-11-06 – 2019-11-08 (×5): 40 mg via ORAL
  Filled 2019-11-06 (×4): qty 1

## 2019-11-06 MED ORDER — HYDROCHLOROTHIAZIDE 12.5 MG PO CAPS
12.5000 mg | ORAL_CAPSULE | Freq: Every day | ORAL | Status: DC
  Administered 2019-11-06: 12.5 mg via ORAL
  Filled 2019-11-06: qty 1

## 2019-11-06 MED ORDER — DICYCLOMINE HCL 10 MG PO CAPS
10.0000 mg | ORAL_CAPSULE | Freq: Three times a day (TID) | ORAL | Status: DC
  Administered 2019-11-06 (×3): 10 mg via ORAL
  Filled 2019-11-06 (×3): qty 1

## 2019-11-06 MED ORDER — LOSARTAN POTASSIUM 50 MG PO TABS
100.0000 mg | ORAL_TABLET | Freq: Every day | ORAL | Status: DC
  Administered 2019-11-06 – 2019-11-07 (×2): 100 mg via ORAL
  Filled 2019-11-06 (×3): qty 2

## 2019-11-06 MED ORDER — LOSARTAN POTASSIUM 50 MG PO TABS: 75.0000 mg | ORAL_TABLET | Freq: Every day | ORAL | Status: DC

## 2019-11-06 MED ORDER — ALUM & MAG HYDROXIDE-SIMETH 200-200-20 MG/5ML PO SUSP
30.0000 mL | ORAL | Status: DC | PRN
  Administered 2019-11-06 (×2): 30 mL via ORAL
  Filled 2019-11-06 (×2): qty 30

## 2019-11-06 NOTE — Progress Notes (Signed)
Progress Note  Patient Name: Leah Pittman Date of Encounter: 11/06/2019  [facility] Cardiologist: Thurmon Fair, MD   Subjective   No CV complaints. Epigastric pain recurred promptly with eating. Has some whooshing in her left ear, positional. SBP staying high.  Inpatient Medications    Scheduled Meds: . aspirin EC  81 mg Oral Daily  . dorzolamide-timolol  1 drop Left Eye BID  . enoxaparin (LOVENOX) injection  40 mg Subcutaneous QHS  . hydrochlorothiazide  12.5 mg Oral Daily  . latanoprost  1 drop Both Eyes QHS  . levothyroxine  100 mcg Oral QAC breakfast  . linaclotide  145 mcg Oral QAC breakfast  . losartan  100 mg Oral Daily  . pantoprazole  40 mg Oral BID  . polyethylene glycol  17 g Oral TID  . ranolazine  500 mg Oral BID  . simvastatin  20 mg Oral QHS  . sodium chloride flush  3 mL Intravenous Q12H  . sodium chloride flush  3 mL Intravenous Q12H   Continuous Infusions: . sodium chloride    . sodium chloride 50 mL/hr at 11/06/19 0923   PRN Meds: sodium chloride, acetaminophen **OR** acetaminophen, albuterol, alum & mag hydroxide-simeth, bisacodyl, fentaNYL (SUBLIMAZE) injection, guaiFENesin-dextromethorphan, labetalol, LORazepam, ondansetron **OR** ondansetron (ZOFRAN) IV, senna-docusate, sodium chloride flush   Vital Signs    Vitals:   11/05/19 1240 11/05/19 2102 11/06/19 0536 11/06/19 0906  BP: (!) 172/65 (!) 145/54 (!) 163/60 (!) 178/68  Pulse: 66 68 67 67  Resp: 16 16 18 18   Temp: (!) 97.3 F (36.3 C) 98.3 F (36.8 C) 98.3 F (36.8 C)   TempSrc: Oral Oral Oral   SpO2: 100% 96% 95% 100%  Weight:   53.9 kg   Height:        Intake/Output Summary (Last 24 hours) at 11/06/2019 1003 Last data filed at 11/05/2019 1900 Gross per 24 hour  Intake 996.79 ml  Output --  Net 996.79 ml   Last 3 Weights 11/06/2019 11/05/2019 11/03/2019  Weight (lbs) 118 lb 12.8 oz 118 lb 118 lb  Weight (kg) 53.887 kg 53.524 kg 53.524 kg      Telemetry    NSR -  Personally Reviewed  ECG    No new tracing - Personally Reviewed  Physical Exam  Appears well GEN: No acute distress.   Neck: No JVD Cardiac: RRR, no murmurs, rubs, or gallops.  Respiratory: Clear to auscultation bilaterally. GI: Soft, nontender, non-distended  MS: No edema; No deformity. Neuro:  Nonfocal  Psych: Normal affect   Labs    High Sensitivity Troponin:   Recent Labs  Lab 11/03/19 1252 11/03/19 1528 11/03/19 2315 11/04/19 0046  TROPONINIHS 5 7 7 6       Chemistry Recent Labs  Lab 11/03/19 1252 11/03/19 1528 11/04/19 0617 11/05/19 0358  NA 139  --  138 140  K 4.2  --  3.6 3.8  CL 106  --  105 109  CO2 24  --  23 23  GLUCOSE 96  --  113* 94  BUN 14  --  14 13  CREATININE 0.91  --  0.80 0.81  CALCIUM 9.9  --  9.8 9.2  PROT  --  7.0 6.4* 5.7*  ALBUMIN  --  4.2 3.6 3.3*  AST  --  42* 21 18  ALT  --  21 19 18   ALKPHOS  --  53 52 45  BILITOT  --  1.0 0.3 0.6  GFRNONAA 57*  --  >  60 >60  GFRAA >60  --  >60 >60  ANIONGAP 9  --  10 8     Hematology Recent Labs  Lab 11/04/19 0617 11/05/19 0358 11/06/19 0630  WBC 8.0 6.3 9.1  RBC 3.99 3.80* 3.72*  HGB 12.4 11.8* 11.8*  HCT 36.9 35.7* 35.3*  MCV 92.5 93.9 94.9  MCH 31.1 31.1 31.7  MCHC 33.6 33.1 33.4  RDW 14.4 14.6 14.6  PLT 205 193 207    BNP Recent Labs  Lab 11/03/19 1528  BNP 149.8*     DDimer No results for input(s): DDIMER in the last 168 hours.   Radiology    MR 3D Recon At Scanner  Result Date: 11/05/2019 CLINICAL DATA:  Pancreatitis is suspected. Abnormal findings on recent CT of the chest, abdomen and pelvis EXAM: MRI ABDOMEN WITHOUT AND WITH CONTRAST (INCLUDING MRCP) TECHNIQUE: Multiplanar multisequence MR imaging of the abdomen was performed both before and after the administration of intravenous contrast. Heavily T2-weighted images of the biliary and pancreatic ducts were obtained, and three-dimensional MRCP images were rendered by post processing. CONTRAST:  21mL GADAVIST  GADOBUTROL 1 MMOL/ML IV SOLN COMPARISON:  Multiple prior studies, most recent exam none 132021, studies dating back to 2014 are available for comparison FINDINGS: Lower chest: Incidental imaging of the lung bases without consolidation or sign of pleural effusion. CT Hepatobiliary: Biliary duct distension following cholecystectomy is stable compared to studies from 2019. No focal, suspicious hepatic lesion portal vein and hepatic veins are patent. Mild hepatic steatosis. Pancreas: Stable dilation of the main pancreatic duct particularly in the head of the pancreas. No focal, suspicious pancreatic lesion with stable pancreatic contour dating back to 2019. Normal intrinsic T1 signal in pancreas which shows generalized, mild atrophy. Spleen:  Within normal limits in size and appearance. Adrenals/Urinary Tract: Adrenal glands are normal. Kidneys enhance symmetrically without focal, suspicious renal lesion. Stomach/Bowel: Gastrointestinal track showing question of mild colonic thickening and colonic wall edema. Stool in the colon limiting assessment. There is also subtle ahaustral change in the colon, this is subtle on today's study. And may be related to artifact in the setting of stool filled colonic loops Vascular/Lymphatic: Abdominal aortic atherosclerotic changes without aneurysmal dilation of the abdominal aorta Other:  No ascites. Musculoskeletal: Post sternotomy. IMPRESSION: 1. Stable dilation of the main pancreatic duct particularly in the head of the pancreas. No focal, suspicious pancreatic lesion with stable contour dating back to 2019. No signs of pancreatic inflammation to indicate pancreatitis. Given main duct distension would consider a 12 month follow-up either with CT or MRI. 2. Biliary duct distension following cholecystectomy is stable compared to studies from 2019. 3. Ahaustral appearance of the colon is suggested in some areas along with mild colonic wall thickening. Correlate with any clinical signs  of colitis, this could be due to artifact. 4. Mild hepatic steatosis. Electronically Signed   By: Donzetta Kohut M.D.   On: 11/05/2019 12:21   MR ABDOMEN MRCP W WO CONTAST  Result Date: 11/05/2019 CLINICAL DATA:  Pancreatitis is suspected. Abnormal findings on recent CT of the chest, abdomen and pelvis EXAM: MRI ABDOMEN WITHOUT AND WITH CONTRAST (INCLUDING MRCP) TECHNIQUE: Multiplanar multisequence MR imaging of the abdomen was performed both before and after the administration of intravenous contrast. Heavily T2-weighted images of the biliary and pancreatic ducts were obtained, and three-dimensional MRCP images were rendered by post processing. CONTRAST:  81mL GADAVIST GADOBUTROL 1 MMOL/ML IV SOLN COMPARISON:  Multiple prior studies, most recent exam none 132021, studies dating  back to 2014 are available for comparison FINDINGS: Lower chest: Incidental imaging of the lung bases without consolidation or sign of pleural effusion. CT Hepatobiliary: Biliary duct distension following cholecystectomy is stable compared to studies from 2019. No focal, suspicious hepatic lesion portal vein and hepatic veins are patent. Mild hepatic steatosis. Pancreas: Stable dilation of the main pancreatic duct particularly in the head of the pancreas. No focal, suspicious pancreatic lesion with stable pancreatic contour dating back to 2019. Normal intrinsic T1 signal in pancreas which shows generalized, mild atrophy. Spleen:  Within normal limits in size and appearance. Adrenals/Urinary Tract: Adrenal glands are normal. Kidneys enhance symmetrically without focal, suspicious renal lesion. Stomach/Bowel: Gastrointestinal track showing question of mild colonic thickening and colonic wall edema. Stool in the colon limiting assessment. There is also subtle ahaustral change in the colon, this is subtle on today's study. And may be related to artifact in the setting of stool filled colonic loops Vascular/Lymphatic: Abdominal aortic  atherosclerotic changes without aneurysmal dilation of the abdominal aorta Other:  No ascites. Musculoskeletal: Post sternotomy. IMPRESSION: 1. Stable dilation of the main pancreatic duct particularly in the head of the pancreas. No focal, suspicious pancreatic lesion with stable contour dating back to 2019. No signs of pancreatic inflammation to indicate pancreatitis. Given main duct distension would consider a 12 month follow-up either with CT or MRI. 2. Biliary duct distension following cholecystectomy is stable compared to studies from 2019. 3. Ahaustral appearance of the colon is suggested in some areas along with mild colonic wall thickening. Correlate with any clinical signs of colitis, this could be due to artifact. 4. Mild hepatic steatosis. Electronically Signed   By: Donzetta Kohut M.D.   On: 11/05/2019 12:21    Cardiac Studies  CATH 07/25/2019  Prox RCA to Mid RCA lesion is 40% stenosed.  Ost LM to Mid LM lesion is 60% stenosed.  Mid Cx lesion is 99% stenosed.  SVG graft was visualized by angiography and is normal in caliber.  The graft exhibits no disease.  SVG graft was visualized by angiography and is normal in caliber.  The graft exhibits no disease.  Mid LAD lesion is 70% stenosed.   Severe double vessel CAD Moderately severe ostial left main stenosis Severe stenosis mid LAD. The SVG to the LAD is patent. The LIMA does not appear to have been used despite the findings in the operative report. The LIMA is small and atretic.  Severe stenosis mid Circumflex. OM and OM2 fill from the patent vein graft.  Mild disease mid RCA  Recommendations: Continue medical management of CAD. Will start lactulose for constipation. She could be discharged later today after bedrest if she is stable.    Patient Profile     86 y.o. female with PMH of CAD s/p CABG in May 2020 (repeat cath 07/2018 showed patent SVG x 2 and atretic LIMA), HTN, and HLD admitted with chest and epigastric  pain  Assessment & Plan   [facility] will sign off.   Medication Recommendations:  Increase losartan to 75 mg daily, further adjustments if necessary as an outpt. Other recommendations (labs, testing, etc):  n/a Follow up as an outpatient:  As previously scheduled    For questions or updates, please contact [facility] Please consult [email or web address] for contact info under        Signed, Thurmon Fair, MD  11/06/2019, 10:03 AM

## 2019-11-06 NOTE — Progress Notes (Signed)
[facility] PROGRESS NOTE    Progress Note  Leah Pittman  EXN:[id number] DOB: 04-26-1930 DOA: 11/03/2019 PCP: Blane Ohara, MD     Brief Narrative:   Leah Pittman is an 86 y.o. female past medical history significant for CAD status post CABG in May 2020, with a recent cath essential hypertension presents to the ED for chest pain.  She relates exertional chest pain but no improvement with rest.  Upon arrival to the ED chest x-ray was negative for cardiopulmonary disease abdominal x-ray was notable for possible type B aortic dissection she was started on IV labetalol and hydralazine cardiology was consulted  Assessment/Plan:   Chest pain/  Coronary artery disease involving native coronary artery of native heart without angina pectoris: Blood pressure extremely elevated on admission, troponins negative no acute findings on ECG. CT angio of chest and abdomen on 11/04/2019 showed hypodensity along the aortic arc may represent a type B aortic dissection with a noncalcified thrombus, is unchanged from previous studies.  No aneurysmal or hematoma.  And persistent pancreatic main duct dilation of the 5 mm of the head and neck of the pancreas concerning for neoplasm. Cardiology was consulted they relate symptoms noncardiogenic.  They recommended no further work-up.  Hypertensive  crisis: With a blood pressure of 229/120 on admission she was started on IV labetalol hydralazine. Her blood pressure continues to be elevated question continue losartan at the current dose. Cannot use a beta-blocker as her heart rates in the 50s and 60s. We will go ahead and start her on low-dose hydrochlorothiazide.  Epigastric tenderness/pancreatic duct dilation: - CT showing extensive atherosclerosis and atrophy of the pancreas with dilated duct concerning for neoplasm.  But no evidence of inflammatory disease. - MRCP done on 11/05/2019 showed stable dilation of the main pancreatic duct no focal suspicion  for neoplastic lesion, no signs of inflammatory changes indicating pancreatitis.  Given dilated duct a CT and MRCP to be repeated in 12 months, she is status post cholecystectomy GI has signed off. She was started on MiraLAX p.o. twice daily and Colace she relates her abdominal pain is improved, but she is having epigastric pain this morning was started on Maalox and Protonix p.o. twice daily will reevaluate tomorrow. He is complaining of pain but trying to eat her full liquid diet if she tolerates this we will advance her diet, and she is able to keep it down having regular bowel movement we could probably discharge her tomorrow morning. Abdominal exam is completely benign.  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  Anxiety: Continue Ativan as needed.  Hypothyroidism: With a TSH of 6.4 free T4 of 1.1, will have to be repeated in 6 weeks free T4 and TSH as an outpatient.  COPD: Appears to be stable continue inhalers.    DVT prophylaxis: lovenxo Family Communication:none Status is: Observation  The patient remains OBS appropriate and will d/c before 2 midnights.  Dispo: The patient is from: Home              Anticipated d/c is to: Home              Anticipated d/c date is: 1 days              Patient currently is not medically stable to d/c.  Probably home tomorrow if her abdominal pain is improved and she is tolerating her diet.  Patient is always complaining about some time for pain, initially she was complaining of pain behind her eyes when  she would eat      Code Status:     Code Status Orders  (From admission, onward)         Start     Ordered   11/03/19 2235  Do not attempt resuscitation (DNR)  Continuous       Question Answer Comment  In the event of cardiac or respiratory ARREST Do not call a "code blue"   In the event of cardiac or respiratory ARREST Do not perform Intubation, CPR, defibrillation or ACLS   In the event of cardiac or respiratory ARREST Use  medication by any route, position, wound care, and other measures to relive pain and suffering. May use oxygen, suction and manual treatment of airway obstruction as needed for comfort.      11/03/19 2239        Code Status History    Date Active Date Inactive Code Status Order ID Comments User Context   07/25/2019 0018 07/27/2019 2131 DNR [id number]  John Giovanniathore, Vasundhra, MD ED   07/24/2019 2335 07/25/2019 0018 Full Code [id number]  John Giovanniathore, Vasundhra, MD ED   07/08/2018 1011 07/19/2018 2153 Full Code [id number]  Runell GessBerry, Jonathan J, MD Inpatient   Advance Care Planning Activity        IV Access:    Peripheral IV   Procedures and diagnostic studies:   MR 3D Recon At Scanner  Result Date: 11/05/2019 CLINICAL DATA:  Pancreatitis is suspected. Abnormal findings on recent CT of the chest, abdomen and pelvis EXAM: MRI ABDOMEN WITHOUT AND WITH CONTRAST (INCLUDING MRCP) TECHNIQUE: Multiplanar multisequence MR imaging of the abdomen was performed both before and after the administration of intravenous contrast. Heavily T2-weighted images of the biliary and pancreatic ducts were obtained, and three-dimensional MRCP images were rendered by post processing. CONTRAST:  5mL GADAVIST GADOBUTROL 1 MMOL/ML IV SOLN COMPARISON:  Multiple prior studies, most recent exam none [id number], studies dating back to 2014 are available for comparison FINDINGS: Lower chest: Incidental imaging of the lung bases without consolidation or sign of pleural effusion. CT Hepatobiliary: Biliary duct distension following cholecystectomy is stable compared to studies from 2019. No focal, suspicious hepatic lesion portal vein and hepatic veins are patent. Mild hepatic steatosis. Pancreas: Stable dilation of the main pancreatic duct particularly in the head of the pancreas. No focal, suspicious pancreatic lesion with stable pancreatic contour dating back to 2019. Normal intrinsic T1 signal in pancreas which shows generalized, mild atrophy. Spleen:   Within normal limits in size and appearance. Adrenals/Urinary Tract: Adrenal glands are normal. Kidneys enhance symmetrically without focal, suspicious renal lesion. Stomach/Bowel: Gastrointestinal track showing question of mild colonic thickening and colonic wall edema. Stool in the colon limiting assessment. There is also subtle ahaustral change in the colon, this is subtle on today's study. And may be related to artifact in the setting of stool filled colonic loops Vascular/Lymphatic: Abdominal aortic atherosclerotic changes without aneurysmal dilation of the abdominal aorta Other:  No ascites. Musculoskeletal: Post sternotomy. IMPRESSION: 1. Stable dilation of the main pancreatic duct particularly in the head of the pancreas. No focal, suspicious pancreatic lesion with stable contour dating back to 2019. No signs of pancreatic inflammation to indicate pancreatitis. Given main duct distension would consider a 12 month follow-up either with CT or MRI. 2. Biliary duct distension following cholecystectomy is stable compared to studies from 2019. 3. Ahaustral appearance of the colon is suggested in some areas along with mild colonic wall thickening. Correlate with any clinical signs of colitis, this could be due  to artifact. 4. Mild hepatic steatosis. Electronically Signed   By: Donzetta Kohut M.D.   On: 11/05/2019 12:21   MR ABDOMEN MRCP W WO CONTAST  Result Date: 11/05/2019 CLINICAL DATA:  Pancreatitis is suspected. Abnormal findings on recent CT of the chest, abdomen and pelvis EXAM: MRI ABDOMEN WITHOUT AND WITH CONTRAST (INCLUDING MRCP) TECHNIQUE: Multiplanar multisequence MR imaging of the abdomen was performed both before and after the administration of intravenous contrast. Heavily T2-weighted images of the biliary and pancreatic ducts were obtained, and three-dimensional MRCP images were rendered by post processing. CONTRAST:  81mL GADAVIST GADOBUTROL 1 MMOL/ML IV SOLN COMPARISON:  Multiple prior studies,  most recent exam none 132021, studies dating back to 2014 are available for comparison FINDINGS: Lower chest: Incidental imaging of the lung bases without consolidation or sign of pleural effusion. CT Hepatobiliary: Biliary duct distension following cholecystectomy is stable compared to studies from 2019. No focal, suspicious hepatic lesion portal vein and hepatic veins are patent. Mild hepatic steatosis. Pancreas: Stable dilation of the main pancreatic duct particularly in the head of the pancreas. No focal, suspicious pancreatic lesion with stable pancreatic contour dating back to 2019. Normal intrinsic T1 signal in pancreas which shows generalized, mild atrophy. Spleen:  Within normal limits in size and appearance. Adrenals/Urinary Tract: Adrenal glands are normal. Kidneys enhance symmetrically without focal, suspicious renal lesion. Stomach/Bowel: Gastrointestinal track showing question of mild colonic thickening and colonic wall edema. Stool in the colon limiting assessment. There is also subtle ahaustral change in the colon, this is subtle on today's study. And may be related to artifact in the setting of stool filled colonic loops Vascular/Lymphatic: Abdominal aortic atherosclerotic changes without aneurysmal dilation of the abdominal aorta Other:  No ascites. Musculoskeletal: Post sternotomy. IMPRESSION: 1. Stable dilation of the main pancreatic duct particularly in the head of the pancreas. No focal, suspicious pancreatic lesion with stable contour dating back to 2019. No signs of pancreatic inflammation to indicate pancreatitis. Given main duct distension would consider a 12 month follow-up either with CT or MRI. 2. Biliary duct distension following cholecystectomy is stable compared to studies from 2019. 3. Ahaustral appearance of the colon is suggested in some areas along with mild colonic wall thickening. Correlate with any clinical signs of colitis, this could be due to artifact. 4. Mild hepatic  steatosis. Electronically Signed   By: Donzetta Kohut M.D.   On: 11/05/2019 12:21     Medical Consultants:    None.  Anti-Infectives:   none  Subjective:    [name] she relates her abdominal pain is improved but she is now having epigastric pain, as per GI her abdominal pain improved after having bowel movements. Today she is relating epigastric pain.  Objective:    Vitals:   11/05/19 1210 11/05/19 1240 11/05/19 2102 11/06/19 0536  BP: (!) 161/58 (!) 172/65 (!) 145/54 (!) 163/60  Pulse: 65 66 68 67  Resp: 16 16 16 18   Temp: (!) 97 F (36.1 C) (!) 97.3 F (36.3 C) 98.3 F (36.8 C) 98.3 F (36.8 C)  TempSrc:  Oral Oral Oral  SpO2: 97% 100% 96% 95%  Weight:    53.9 kg  Height:       SpO2: 95 %   Intake/Output Summary (Last 24 hours) at 11/06/2019 0850 Last data filed at 11/05/2019 1900 Gross per 24 hour  Intake 996.79 ml  Output --  Net 996.79 ml   Filed Weights   11/03/19 1254 11/05/19 0624 11/06/19 0536  Weight:  53.5 kg 53.5 kg 53.9 kg    Exam: General exam: In no acute distress. Respiratory system: Good air movement and clear to auscultation. Cardiovascular system: S1 & S2 heard, RRR. No JVD. Gastrointestinal system: Abdomen is nondistended, soft and nontender.  Extremities: No pedal edema. Skin: No rashes, lesions or ulcers Psychiatry: Judgement and insight appear normal. Mood & affect appropriate.   Data Reviewed:    Labs: Basic Metabolic Panel: Recent Labs  Lab 11/03/19 1252 11/03/19 1252 11/04/19 0617 11/05/19 0358  NA 139  --  138 140  K 4.2   < > 3.6 3.8  CL 106  --  105 109  CO2 24  --  23 23  GLUCOSE 96  --  113* 94  BUN 14  --  14 13  CREATININE 0.91  --  0.80 0.81  CALCIUM 9.9  --  9.8 9.2  MG  --   --  2.0  --    < > = values in this interval not displayed.   GFR Estimated Creatinine Clearance: 42.4 mL/min (by C-G formula based on SCr of 0.81 mg/dL). Liver Function Tests: Recent Labs  Lab 11/03/19 1528  11/04/19 0617 11/05/19 0358  AST 42* 21 18  ALT ALKPHOS 53 52 45  BILITOT 1.0 0.3 0.6  PROT 7.0 6.4* 5.7*  ALBUMIN 4.2 3.6 3.3*   Recent Labs  Lab 11/03/19 1528 11/04/19 0617 11/05/19 0358  LIPASE 65* 32 35   No results for input(s): AMMONIA in the last 168 hours. Coagulation profile Recent Labs  Lab 11/03/19 1648  INR 1.0   COVID-19 Labs  No results for input(s): DDIMER, FERRITIN, LDH, CRP in the last 72 hours.  Lab Results  Component Value Date   SARSCOV2NAA NEGATIVE 11/03/2019   SARSCOV2NAA NEGATIVE 07/24/2019   SARSCOV2NAA NEGATIVE 07/17/2018   SARSCOV2NAA NEGATIVE 07/05/2018    CBC: Recent Labs  Lab 11/03/19 1252 11/04/19 0617 11/05/19 0358 11/06/19 0630  WBC 6.9 8.0 6.3 9.1  HGB 12.6 12.4 11.8* 11.8*  HCT 39.3 36.9 35.7* 35.3*  MCV 95.6 92.5 93.9 94.9  PLT 218 205 193 207   Cardiac Enzymes: No results for input(s): CKTOTAL, CKMB, CKMBINDEX, TROPONINI in the last 168 hours. BNP (last 3 results) No results for input(s): PROBNP in the last 8760 hours. CBG: No results for input(s): GLUCAP in the last 168 hours. D-Dimer: No results for input(s): DDIMER in the last 72 hours. Hgb A1c: No results for input(s): HGBA1C in the last 72 hours. Lipid Profile: Recent Labs    11/04/19 0617  CHOL 206*  HDL 69  LDLCALC 120*  TRIG 86  CHOLHDL 3.0   Thyroid function studies: Recent Labs    11/03/19 1528  TSH 6.815*   Anemia work up: No results for input(s): VITAMINB12, FOLATE, FERRITIN, TIBC, IRON, RETICCTPCT in the last 72 hours. Sepsis Labs: Recent Labs  Lab 11/03/19 1252 11/04/19 0617 11/05/19 0358 11/06/19 0630  WBC 6.9 8.0 6.3 9.1   Microbiology Recent Results (from the past 240 hour(s))  SARS Coronavirus 2 by RT PCR (hospital order, performed in [facility] hospital lab) Nasopharyngeal Nasopharyngeal Swab     Status: None   Collection Time: 11/03/19  3:34 PM   Specimen: Nasopharyngeal Swab  Result Value Ref Range Status    SARS Coronavirus 2 NEGATIVE NEGATIVE Final    Comment: (NOTE) SARS-CoV-2 target nucleic acids are NOT DETECTED.  The SARS-CoV-2 RNA is generally detectable in upper and lower respiratory specimens during the acute  phase of infection. The lowest concentration of SARS-CoV-2 viral copies this assay can detect is 250 copies / mL. A negative result does not preclude SARS-CoV-2 infection and should not be used as the sole basis for treatment or other patient management decisions.  A negative result may occur with improper specimen collection / handling, submission of specimen other than nasopharyngeal swab, presence of viral mutation(s) within the areas targeted by this assay, and inadequate number of viral copies (<250 copies / mL). A negative result must be combined with clinical observations, patient history, and epidemiological information.  Fact Sheet for Patients:   BoilerBrush.com.cy  Fact Sheet for Healthcare Providers: https://pope.com/  This test is not yet approved or  cleared by the [place] FDA and has been authorized for detection and/or diagnosis of SARS-CoV-2 by FDA under an Emergency Use Authorization (EUA).  This EUA will remain in effect (meaning this test can be used) for the duration of the COVID-19 declaration under Section 564(b)(1) of the Act, 21 U.S.C. section 360bbb-3(b)(1), unless the authorization is terminated or revoked sooner.  Performed at [facility], [address], [place], [place] [address]   Urine culture     Status: Abnormal   Collection Time: 11/03/19  3:46 PM   Specimen: Urine, Random  Result Value Ref Range Status   Specimen Description URINE, RANDOM  Final   Special Requests NONE  Final   Culture (A)  Final    <10,000 COLONIES/mL INSIGNIFICANT GROWTH Performed at [facility], [address], [place], [place] [address]    Report Status 11/05/2019 FINAL  Final      Medications:   . aspirin EC  81 mg Oral Daily  . dorzolamide-timolol  1 drop Left Eye BID  . enoxaparin (LOVENOX) injection  40 mg Subcutaneous QHS  . latanoprost  1 drop Both Eyes QHS  . levothyroxine  100 mcg Oral QAC breakfast  . linaclotide  145 mcg Oral QAC breakfast  . losartan  75 mg Oral Daily  . pantoprazole  40 mg Oral Daily  . polyethylene glycol  17 g Oral TID  . ranolazine  500 mg Oral BID  . simvastatin  20 mg Oral QHS  . sodium chloride flush  3 mL Intravenous Q12H  . sodium chloride flush  3 mL Intravenous Q12H   Continuous Infusions: . sodium chloride    . sodium chloride Stopped (11/05/19 1857)      LOS: 1 day   Marinda Elk  [facility]  11/06/2019, 8:50 AM

## 2019-11-07 LAB — CBC
HCT: 37.5 % (ref 36.0–46.0)
Hemoglobin: 12.4 g/dL (ref 12.0–15.0)
MCH: 31 pg (ref 26.0–34.0)
MCHC: 33.1 g/dL (ref 30.0–36.0)
MCV: 93.8 fL (ref 80.0–100.0)
Platelets: 225 10*3/uL (ref 150–400)
RBC: 4 MIL/uL (ref 3.87–5.11)
RDW: 14.5 % (ref 11.5–15.5)
WBC: 7.5 10*3/uL (ref 4.0–10.5)
nRBC: 0 % (ref 0.0–0.2)

## 2019-11-07 MED ORDER — AMLODIPINE BESYLATE 5 MG PO TABS
5.0000 mg | ORAL_TABLET | Freq: Every day | ORAL | Status: DC
  Administered 2019-11-07: 5 mg via ORAL
  Filled 2019-11-07: qty 1

## 2019-11-07 MED ORDER — HYDRALAZINE HCL 25 MG PO TABS: 25.0000 mg | ORAL_TABLET | Freq: Three times a day (TID) | ORAL | Status: DC

## 2019-11-07 NOTE — Progress Notes (Signed)
PROGRESS NOTE  Leah Pittman AOZ:[id number] DOB: [birth year] DOA: 11/03/2019 PCP: Blane Ohara, MD   LOS: 2 days   Brief Narrative / Interim history: Leah Pittman is an 86 y.o. female past medical history significant for CAD status post CABG in May 2020, with a recent cath essential hypertension presents to the ED for chest pain.  She relates exertional chest pain but no improvement with rest.  Upon arrival to the ED chest x-ray was negative for cardiopulmonary disease abdominal x-ray was notable for possible type B aortic dissection she was started on IV labetalol and hydralazine cardiology was consulted  Subjective / 24h Interval events: Anxious about BP reads today in the 180s-190s however did not take her meds  Assessment & Plan: Principal Problem Chest pain/  Coronary artery disease involving native coronary artery of native heart without angina pectoris - Blood pressure was significantly elevated on admission, troponins negative no acute findings on ECG. CT angio of chest and abdomen on 11/04/2019 showed hypodensity along the aortic arc may represent a type B aortic dissection with a noncalcified thrombus, is unchanged from previous studies.  No aneurysmal or hematoma. Cardiology was consulted they relate symptoms noncardiogenic. They recommended no further work-up.  Active Problems Hypertensive crisis - With a blood pressure of 229/120 on admission. Her home irbesartan was increased to 100 mg, BP overall improved with some intermittent reads to 190s. Will monitor overnight and if no further BPs in the 190s probably discharge in am   Epigastric tenderness/pancreatic duct dilation - CT showing extensive atherosclerosis and atrophy of the pancreas with dilated duct concerning for neoplasm.  But no evidence of inflammatory disease. MRCP done on 11/05/2019 showed stable dilation of the main pancreatic duct no focal suspicion for neoplastic lesion, no signs of inflammatory changes indicating  pancreatitis.  Given dilated duct a CT and MRCP to be repeated in 12 months, she is status post cholecystectomy GI consulted and signed has signed off, will follow as an outpatient. 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 Anxiety -Continue Ativan as needed. Hypothyroidism -With a TSH of 6.4 free T4 of 1.1, will have to be repeated in 6 weeks free T4 and TSH as an outpatient. COPD -Appears to be stable continue inhalers.  Scheduled Meds: . aspirin EC  81 mg Oral Daily  . dorzolamide-timolol  1 drop Left Eye BID  . enoxaparin (LOVENOX) injection  40 mg Subcutaneous QHS  . latanoprost  1 drop Both Eyes QHS  . levothyroxine  100 mcg Oral QAC breakfast  . linaclotide  145 mcg Oral QAC breakfast  . losartan  100 mg Oral Daily  . pantoprazole  40 mg Oral BID  . ranolazine  500 mg Oral BID  . simvastatin  20 mg Oral QHS  . sodium chloride flush  3 mL Intravenous Q12H   Continuous Infusions: . sodium chloride     PRN Meds:.sodium chloride, acetaminophen **OR** acetaminophen, albuterol, alum & mag hydroxide-simeth, bisacodyl, fentaNYL (SUBLIMAZE) injection, guaiFENesin-dextromethorphan, labetalol, LORazepam, ondansetron **OR** ondansetron (ZOFRAN) IV, senna-docusate, sodium chloride flush  Diet Orders (From admission, onward)    Start     Ordered   11/06/19 0858  Diet full liquid Room service appropriate? Yes; Fluid consistency: Thin  Diet effective now       Comments: Advance diet as tolerate it  Question Answer Comment  Room service appropriate? Yes   Fluid consistency: Thin      11/06/19 0858          DVT prophylaxis: enoxaparin (  LOVENOX) injection 40 mg Start: 11/03/19 2245     Code Status: DNR  Family Communication: husband at bedside   Status is: Inpatient  Remains inpatient appropriate because:BP with intermittent high reads   Dispo: The patient is from: Home              Anticipated d/c is to: Home              Anticipated d/c date is: 1 day              Patient  currently is not medically stable to d/c.  Consultants:  Cardiology  GI  Procedures:  None   Microbiology  None   Antimicrobials: None     Objective: Vitals:   11/07/19 0450 11/07/19 0537 11/07/19 1000 11/07/19 1228  BP:  (!) 185/52 (!) 174/65 (!) 184/57  Pulse:      Resp:      Temp:      TempSrc:      SpO2:      Weight: 53.8 kg     Height:        Intake/Output Summary (Last 24 hours) at 11/07/2019 1519 Last data filed at 11/07/2019 0150 Gross per 24 hour  Intake 660 ml  Output 1500 ml  Net -840 ml   Filed Weights   11/05/19 0624 11/06/19 0536 11/07/19 0450  Weight: 53.5 kg 53.9 kg 53.8 kg    Examination:  Constitutional: NAD Eyes: no scleral icterus ENMT: Mucous membranes are moist.  Neck: normal, supple Respiratory: clear to auscultation bilaterally, no wheezing, no crackles. Normal respiratory effort. No accessory muscle use.  Cardiovascular: Regular rate and rhythm, no edema Abdomen: non distended, no tenderness. Bowel sounds positive.  Musculoskeletal: no clubbing / cyanosis.  Skin: no rashes Neurologic: CN 2-12 grossly intact. Strength 5/5 in all 4.   Data Reviewed: I have independently reviewed following labs and imaging studies   CBC: Recent Labs  Lab 11/03/19 1252 11/04/19 0617 11/05/19 0358 11/06/19 0630 11/07/19 0543  WBC 6.9 8.0 6.3 9.1 7.5  HGB 12.6 12.4 11.8* 11.8* 12.4  HCT 39.3 36.9 35.7* 35.3* 37.5  MCV 95.6 92.5 93.9 94.9 93.8  PLT 218 205 193 207 225   Basic Metabolic Panel: Recent Labs  Lab 11/03/19 1252 11/04/19 0617 11/05/19 0358  NA 139 138 140  K 4.2 3.6 3.8  CL 106 105 109  CO2 24 23 23   GLUCOSE 96 113* 94  BUN 14 14 13   CREATININE 0.91 0.80 0.81  CALCIUM 9.9 9.8 9.2  MG  --  2.0  --    Liver Function Tests: Recent Labs  Lab 11/03/19 1528 11/04/19 0617 11/05/19 0358  AST 42* 21 18  ALT 21 19 18   ALKPHOS 53 52 45  BILITOT 1.0 0.3 0.6  PROT 7.0 6.4* 5.7*  ALBUMIN 4.2 3.6 3.3*   Coagulation  Profile: Recent Labs  Lab 11/03/19 1648  INR 1.0   HbA1C: No results for input(s): HGBA1C in the last 72 hours. CBG: No results for input(s): GLUCAP in the last 168 hours.  Recent Results (from the past 240 hour(s))  SARS Coronavirus 2 by RT PCR (hospital order, performed in [facility] hospital lab) Nasopharyngeal Nasopharyngeal Swab     Status: None   Collection Time: 11/03/19  3:34 PM   Specimen: Nasopharyngeal Swab  Result Value Ref Range Status   SARS Coronavirus 2 NEGATIVE NEGATIVE Final    Comment: (NOTE) SARS-CoV-2 target nucleic acids are NOT DETECTED.  The SARS-CoV-2 RNA  is generally detectable in upper and lower respiratory specimens during the acute phase of infection. The lowest concentration of SARS-CoV-2 viral copies this assay can detect is 250 copies / mL. A negative result does not preclude SARS-CoV-2 infection and should not be used as the sole basis for treatment or other patient management decisions.  A negative result may occur with improper specimen collection / handling, submission of specimen other than nasopharyngeal swab, presence of viral mutation(s) within the areas targeted by this assay, and inadequate number of viral copies (<250 copies / mL). A negative result must be combined with clinical observations, patient history, and epidemiological information.  Fact Sheet for Patients:   BoilerBrush.com.cy  Fact Sheet for Healthcare Providers: https://pope.com/  This test is not yet approved or  cleared by the [place] FDA and has been authorized for detection and/or diagnosis of SARS-CoV-2 by FDA under an Emergency Use Authorization (EUA).  This EUA will remain in effect (meaning this test can be used) for the duration of the COVID-19 declaration under Section 564(b)(1) of the Act, 21 U.S.C. section 360bbb-3(b)(1), unless the authorization is terminated or revoked sooner.  Performed at [facility], [address], [place], [place] [address]   Urine culture     Status: Abnormal   Collection Time: 11/03/19  3:46 PM   Specimen: Urine, Random  Result Value Ref Range Status   Specimen Description URINE, RANDOM  Final   Special Requests NONE  Final   Culture (A)  Final    <10,000 COLONIES/mL INSIGNIFICANT GROWTH Performed at [facility], [address] [place], [place] [address]    Report Status 11/05/2019 FINAL  Final     Radiology Studies: No results found.   Pamella Pert, MD, PhD [facility]  Between 7 am - 7 pm I am available, please contact me via Amion or Securechat  Between 7 pm - 7 am I am not available, please contact night coverage MD/APP via Amion

## 2019-11-08 MED ORDER — LOSARTAN POTASSIUM 100 MG PO TABS
100.0000 mg | ORAL_TABLET | Freq: Every day | ORAL | 1 refills | Status: DC
Start: 2019-11-08 — End: 2020-07-16

## 2019-11-08 MED ORDER — LOSARTAN POTASSIUM 100 MG PO TABS
100.0000 mg | ORAL_TABLET | Freq: Every day | ORAL | 1 refills | Status: DC
Start: 2019-11-08 — End: 2019-11-08

## 2019-11-08 NOTE — Discharge Instructions (Signed)
Hypertension, Adult Hypertension is another name for high blood pressure. High blood pressure forces your heart to work harder to pump blood. This can cause problems over time. There are two numbers in a blood pressure reading. There is a top number (systolic) over a bottom number (diastolic). It is best to have a blood pressure that is below 120/80. Healthy choices can help lower your blood pressure, or you may need medicine to help lower it. What are the causes? The cause of this condition is not known. Some conditions may be related to high blood pressure. What increases the risk?  Smoking.  Having type 2 diabetes mellitus, high cholesterol, or both.  Not getting enough exercise or physical activity.  Being overweight.  Having too much fat, sugar, calories, or salt (sodium) in your diet.  Drinking too much alcohol.  Having long-term (chronic) kidney disease.  Having a family history of high blood pressure.  Age. Risk increases with age.  Race. You may be at higher risk if you are African American.  Gender. Men are at higher risk than women before age 85. After age 20, women are at higher risk than men.  Having obstructive sleep apnea.  Stress. What are the signs or symptoms?  High blood pressure may not cause symptoms. Very high blood pressure (hypertensive crisis) may cause: ? Headache. ? Feelings of worry or nervousness (anxiety). ? Shortness of breath. ? Nosebleed. ? A feeling of being sick to your stomach (nausea). ? Throwing up (vomiting). ? Changes in how you see. ? Very bad chest pain. ? Seizures. How is this treated?  This condition is treated by making healthy lifestyle changes, such as: ? Eating healthy foods. ? Exercising more. ? Drinking less alcohol.  Your health care provider may prescribe medicine if lifestyle changes are not enough to get your blood pressure under control, and if: ? Your top number is above 130. ? Your bottom number is above  80.  Your personal target blood pressure may vary. Follow these instructions at home: Eating and drinking   If told, follow the DASH eating plan. To follow this plan: ? Fill one half of your plate at each meal with fruits and vegetables. ? Fill one fourth of your plate at each meal with whole grains. Whole grains include whole-wheat pasta, brown rice, and whole-grain bread. ? Eat or drink low-fat dairy products, such as skim milk or low-fat yogurt. ? Fill one fourth of your plate at each meal with low-fat (lean) proteins. Low-fat proteins include fish, chicken without skin, eggs, beans, and tofu. ? Avoid fatty meat, cured and processed meat, or chicken with skin. ? Avoid pre-made or processed food.  Eat less than 1,500 mg of salt each day.  Do not drink alcohol if: ? Your doctor tells you not to drink. ? You are pregnant, may be pregnant, or are planning to become pregnant.  If you drink alcohol: ? Limit how much you use to:  0-1 drink a day for women.  0-2 drinks a day for men. ? Be aware of how much alcohol is in your drink. In the [place] one drink equals one 12 oz bottle of beer (355 mL), one 5 oz glass of wine (148 mL), or one 1 oz glass of hard liquor (44 mL). Lifestyle   Work with your doctor to stay at a healthy weight or to lose weight. Ask your doctor what the best weight is for you.  Get at least 30 minutes of exercise most  days of the week. This may include walking, swimming, or biking.  Get at least 30 minutes of exercise that strengthens your muscles (resistance exercise) at least 3 days a week. This may include lifting weights or doing Pilates.  Do not use any products that contain nicotine or tobacco, such as cigarettes, e-cigarettes, and chewing tobacco. If you need help quitting, ask your doctor.  Check your blood pressure at home as told by your doctor.  Keep all follow-up visits as told by your doctor. This is important. Medicines  Take over-the-counter  and prescription medicines only as told by your doctor. Follow directions carefully.  Do not skip doses of blood pressure medicine. The medicine does not work as well if you skip doses. Skipping doses also puts you at risk for problems.  Ask your doctor about side effects or reactions to medicines that you should watch for. Contact a doctor if you:  Think you are having a reaction to the medicine you are taking.  Have headaches that keep coming back (recurring).  Feel dizzy.  Have swelling in your ankles.  Have trouble with your vision. Get help right away if you:  Get a very bad headache.  Start to feel mixed up (confused).  Feel weak or numb.  Feel faint.  Have very bad pain in your: ? Chest. ? Belly (abdomen).  Throw up more than once.  Have trouble breathing. Summary  Hypertension is another name for high blood pressure.  High blood pressure forces your heart to work harder to pump blood.  For most people, a normal blood pressure is less than 120/80.  Making healthy choices can help lower blood pressure. If your blood pressure does not get lower with healthy choices, you may need to take medicine. This information is not intended to replace advice given to you by your health care provider. Make sure you discuss any questions you have with your health care provider. Document Revised: 10/17/2017 Document Reviewed: 10/17/2017 Elsevier Patient Education  2020 [employer]    Heart-Healthy Eating Plan Heart-healthy meal planning includes:  Eating less unhealthy fats.  Eating more healthy fats.  Making other changes in your diet. Talk with your doctor or a diet specialist (dietitian) to create an eating plan that is right for you. What is my plan? Your doctor may recommend an eating plan that includes:  Total fat: ______% or less of total calories a day.  Saturated fat: ______% or less of total calories a day.  Cholesterol: less than _________mg a  day. What are tips for following this plan? Cooking Avoid frying your food. Try to bake, boil, grill, or broil it instead. You can also reduce fat by:  Removing the skin from poultry.  Removing all visible fats from meats.  Steaming vegetables in water or broth. Meal planning   At meals, divide your plate into four equal parts: ? Fill one-half of your plate with vegetables and green salads. ? Fill one-fourth of your plate with whole grains. ? Fill one-fourth of your plate with lean protein foods.  Eat 4-5 servings of vegetables per day. A serving of vegetables is: ? 1 cup of raw or cooked vegetables. ? 2 cups of raw leafy greens.  Eat 4-5 servings of fruit per day. A serving of fruit is: ? 1 medium whole fruit. ?  cup of dried fruit. ?  cup of fresh, frozen, or canned fruit. ?  cup of 100% fruit juice.  Eat more foods that have soluble fiber.  These are apples, broccoli, carrots, beans, peas, and barley. Try to get 20-30 g of fiber per day.  Eat 4-5 servings of nuts, legumes, and seeds per week: ? 1 serving of dried beans or legumes equals  cup after being cooked. ? 1 serving of nuts is  cup. ? 1 serving of seeds equals 1 tablespoon. General information  Eat more home-cooked food. Eat less restaurant, buffet, and fast food.  Limit or avoid alcohol.  Limit foods that are high in starch and sugar.  Avoid fried foods.  Lose weight if you are overweight.  Keep track of how much salt (sodium) you eat. This is important if you have high blood pressure. Ask your doctor to tell you more about this.  Try to add vegetarian meals each week. Fats  Choose healthy fats. These include olive oil and canola oil, flaxseeds, walnuts, almonds, and seeds.  Eat more omega-3 fats. These include salmon, mackerel, sardines, tuna, flaxseed oil, and ground flaxseeds. Try to eat fish at least 2 times each week.  Check food labels. Avoid foods with trans fats or high amounts of  saturated fat.  Limit saturated fats. ? These are often found in animal products, such as meats, butter, and cream. ? These are also found in plant foods, such as palm oil, palm kernel oil, and coconut oil.  Avoid foods with partially hydrogenated oils in them. These have trans fats. Examples are stick margarine, some tub margarines, cookies, crackers, and other baked goods. What foods can I eat? Fruits All fresh, canned (in natural juice), or frozen fruits. Vegetables Fresh or frozen vegetables (raw, steamed, roasted, or grilled). Green salads. Grains Most grains. Choose whole wheat and whole grains most of the time. Rice and pasta, including brown rice and pastas made with whole wheat. Meats and other proteins Lean, well-trimmed beef, veal, pork, and lamb. Chicken and [place] without skin. All fish and shellfish. Wild duck, rabbit, pheasant, and venison. Egg whites or low-cholesterol egg substitutes. Dried beans, peas, lentils, and tofu. Seeds and most nuts. Dairy Low-fat or nonfat cheeses, including ricotta and mozzarella. Skim or 1% milk that is liquid, powdered, or evaporated. Buttermilk that is made with low-fat milk. Nonfat or low-fat yogurt. Fats and oils Non-hydrogenated (trans-free) margarines. Vegetable oils, including soybean, sesame, sunflower, olive, peanut, safflower, corn, canola, and cottonseed. Salad dressings or mayonnaise made with a vegetable oil. Beverages Mineral water. Coffee and tea. Diet carbonated beverages. Sweets and desserts Sherbet, gelatin, and fruit ice. Small amounts of dark chocolate. Limit all sweets and desserts. Seasonings and condiments All seasonings and condiments. The items listed above may not be a complete list of foods and drinks you can eat. Contact a dietitian for more options. What foods should I avoid? Fruits Canned fruit in heavy syrup. Fruit in cream or butter sauce. Fried fruit. Limit coconut. Vegetables Vegetables cooked in cheese,  cream, or butter sauce. Fried vegetables. Grains Breads that are made with saturated or trans fats, oils, or whole milk. Croissants. Sweet rolls. Donuts. High-fat crackers, such as cheese crackers. Meats and other proteins Fatty meats, such as hot dogs, ribs, sausage, bacon, rib-eye roast or steak. High-fat deli meats, such as salami and bologna. Caviar. Domestic duck and goose. Organ meats, such as liver. Dairy Cream, sour cream, cream cheese, and creamed cottage cheese. Whole-milk cheeses. Whole or 2% milk that is liquid, evaporated, or condensed. Whole buttermilk. Cream sauce or high-fat cheese sauce. Yogurt that is made from whole milk. Fats and oils Meat fat, or  shortening. Cocoa butter, hydrogenated oils, palm oil, coconut oil, palm kernel oil. Solid fats and shortenings, including bacon fat, salt pork, lard, and butter. Nondairy cream substitutes. Salad dressings with cheese or sour cream. Beverages Regular sodas and juice drinks with added sugar. Sweets and desserts Frosting. Pudding. Cookies. Cakes. Pies. Milk chocolate or white chocolate. Buttered syrups. Full-fat ice cream or ice cream drinks. The items listed above may not be a complete list of foods and drinks to avoid. Contact a dietitian for more information. Summary  Heart-healthy meal planning includes eating less unhealthy fats, eating more healthy fats, and making other changes in your diet.  Eat a balanced diet. This includes fruits and vegetables, low-fat or nonfat dairy, lean protein, nuts and legumes, whole grains, and heart-healthy oils and fats. This information is not intended to replace advice given to you by your health care provider. Make sure you discuss any questions you have with your health care provider. Document Revised: 04/12/2017 Document Reviewed: 03/16/2017 Elsevier Patient Education  2020 [employer].

## 2019-11-08 NOTE — Discharge Summary (Signed)
Physician Discharge Summary  Leah Pittman:[id number] DOB: Jun 03, 1930 DOA: 11/03/2019  PCP: Blane Ohara, MD  Admit date: 11/03/2019 Discharge date: 11/08/2019  Admitted From: home Disposition:  home  Recommendations for Outpatient Follow-up:  1. Follow up with PCP in 1-2 weeks  Home Health: none Equipment/Devices: none  Discharge Condition: stable CODE STATUS: DNR Diet recommendation: low sodium  HPI: Per admitting MD, Leah Pittman is a 86 y.o. female with medical history significant for CAD status post CABG in May 2020, hypertension, anxiety, and hypothyroidism, now presenting to the emergency department for evaluation of chest pain.  The patient reports chest pain, shortness of breath, and fatigue for the past several days that became severe yesterday and is now constant.  Symptoms seem to be worse with exertion but do not resolve with rest.  She describes a severe pressure sensation over the central chest, sometimes radiating to the right chest, and associated with shortness of breath without wheezing or fever.  She reports some low blood pressure readings at home and does not take her antihypertensives every day because of this.  She denies any headache, change in vision or hearing, or focal weakness, but reports bilateral foot numbness that seems to be chronic.  She has not tried any nitroglycerin for the current symptoms.  She reports chronic epigastric pain and chronic constipation that seems to be worse in recent months and for which she was planning to follow-up with her gastroenterologist who she has not seen in over a year.  Hospital Course / Discharge diagnoses: Principal Problem Chest pain/ Coronary artery disease involving native coronary artery of native heart without angina pectoris - Blood pressure was significantly elevatedon admission, troponins negative no acute findings on ECG. CT angio of chest and abdomen on 11/04/2019 showed hypodensity along the aortic  arc may represent a type B aortic dissection with a noncalcified thrombus, is unchanged from previous studies. No aneurysmal or hematoma. Cardiology was consulted they relate symptoms noncardiogenic.They recommended no further work-up as an inpatient.  Active Problems Hypertensive crisis - With a blood pressure of 229/120 on admission. Her home irbesartan was increased to 100 mg, BP overall improved and now within normal parameters.  Epigastric tenderness/pancreatic duct dilation -CT showing extensive atherosclerosis and atrophy of the pancreas with dilated duct concerning for neoplasm. But no evidence of inflammatory disease. MRCP done on 11/05/2019 showed stable dilation of the main pancreatic duct no focal suspicion for neoplastic lesion, no signs of inflammatory changes indicating pancreatitis. Given dilated duct a CT and MRCPto be repeated in 12 months, she is status post cholecystectomy GI consulted and signed has signed off, will follow as an outpatient. Type B dissection -blood pressure control Anxiety -Continue Ativan as needed. Hypothyroidism -With a TSH of 6.4 free T4 of 1.1, will have to be repeated in 6 weeks free T4 and TSH as an outpatient. COPD -Appears to be stable continue inhalers.  Discharge Instructions   Allergies as of 11/08/2019      Reactions   Benadryl [diphenhydramine] Swelling, Other (See Comments)   Tongue swells and hallucinates- could not talk   Codeine Nausea And Vomiting, Hypertension   Meperidine Nausea Only, Swelling, Other (See Comments)   Tongue swells and "I feel like death"   Prednisone    Hypertension    Promethazine Other (See Comments)   Hypotension   Propranolol Nausea And Vomiting   Shellfish Allergy Nausea And Vomiting   Tape Other (See Comments)   Tears the skin   Tazarotene  Other (See Comments)   Reaction not recalled   Iodinated Diagnostic Agents Nausea Only, Rash      Medication List    TAKE these medications   acetaminophen  325 MG tablet Commonly known as: TYLENOL Take 975 mg by mouth every 8 (eight) hours as needed for mild pain or headache.   aspirin 81 MG tablet Take 81 mg by mouth daily.   CALCIUM CITRATE+D3 PETITES PO Take 1 tablet by mouth daily.   dorzolamide-timolol 22.3-6.8 MG/ML ophthalmic solution Commonly known as: COSOPT Place 1 drop into the left eye 2 (two) times daily.   latanoprost 0.005 % ophthalmic solution Commonly known as: XALATAN Place 1 drop into both eyes at bedtime.   levothyroxine 100 MCG tablet Commonly known as: SYNTHROID TAKE 1 TABLET BY MOUTH EVERY DAY What changed: when to take this   LORazepam 1 MG tablet Commonly known as: ATIVAN TAKE 1 TABLET(1 MG) BY MOUTH TWICE DAILY AS NEEDED FOR ANXIETY What changed: See the new instructions.   losartan 100 MG tablet Commonly known as: COZAAR Take 1 tablet (100 mg total) by mouth daily. What changed:   medication strength  See the new instructions.   magnesium oxide 400 MG tablet Commonly known as: MAG-OX Take 400 mg by mouth daily. Chewable   nitroGLYCERIN 0.4 MG SL tablet Commonly known as: NITROSTAT Place 0.4 mg under the tongue every 5 (five) minutes as needed for chest pain.   ondansetron 8 MG tablet Commonly known as: ZOFRAN Take 1 tablet (8 mg total) by mouth 2 (two) times daily as needed for nausea or vomiting.   pantoprazole 40 MG tablet Commonly known as: PROTONIX Take 1 tablet (40 mg total) by mouth daily.   PROBIOTIC DAILY PO Take 1 capsule by mouth daily.   ranolazine 500 MG 12 hr tablet Commonly known as: RANEXA TAKE 1 TABLET BY MOUTH TWICE A DAY   simvastatin 20 MG tablet Commonly known as: ZOCOR TAKE 1 TABLET BY MOUTH EVERYDAY AT BEDTIME What changed: See the new instructions.   Vitamin D (Cholecalciferol) 50 MCG (2000 UT) Caps Take 1 tablet by mouth daily.       Follow-up Information    Lynann Bologna, MD Follow up on 01/09/2020.   Specialties: Gastroenterology, Internal  Medicine Why: 930 am for follow up of stomach problems Contact information: [address] [place] [address] [phone]               Consultations:  Cardiology   GI  Procedures/Studies:  DG Chest 2 View  Result Date: 11/03/2019 CLINICAL DATA:  Chest pain. EXAM: CHEST - 2 VIEW COMPARISON:  Chest x-ray dated July 24, 2019. FINDINGS: The heart size and mediastinal contours are within normal limits. Prior CABG. Normal pulmonary vascularity. No focal consolidation, pleural effusion, or pneumothorax. No acute osseous abnormality. IMPRESSION: No active cardiopulmonary disease. Electronically Signed   By: Obie Dredge M.D.   On: 11/03/2019 13:27   MR 3D Recon At Scanner  Result Date: 11/05/2019 CLINICAL DATA:  Pancreatitis is suspected. Abnormal findings on recent CT of the chest, abdomen and pelvis EXAM: MRI ABDOMEN WITHOUT AND WITH CONTRAST (INCLUDING MRCP) TECHNIQUE: Multiplanar multisequence MR imaging of the abdomen was performed both before and after the administration of intravenous contrast. Heavily T2-weighted images of the biliary and pancreatic ducts were obtained, and three-dimensional MRCP images were rendered by post processing. CONTRAST:  39mL GADAVIST GADOBUTROL 1 MMOL/ML IV SOLN COMPARISON:  Multiple prior studies, most recent exam none  [id number], studies dating back to 2014 are available for comparison FINDINGS: Lower chest: Incidental imaging of the lung bases without consolidation or sign of pleural effusion. CT Hepatobiliary: Biliary duct distension following cholecystectomy is stable compared to studies from 2019. No focal, suspicious hepatic lesion portal vein and hepatic veins are patent. Mild hepatic steatosis. Pancreas: Stable dilation of the main pancreatic duct particularly in the head of the pancreas. No focal, suspicious pancreatic lesion with stable pancreatic contour dating back to 2019. Normal intrinsic T1 signal in pancreas which  shows generalized, mild atrophy. Spleen:  Within normal limits in size and appearance. Adrenals/Urinary Tract: Adrenal glands are normal. Kidneys enhance symmetrically without focal, suspicious renal lesion. Stomach/Bowel: Gastrointestinal track showing question of mild colonic thickening and colonic wall edema. Stool in the colon limiting assessment. There is also subtle ahaustral change in the colon, this is subtle on today's study. And may be related to artifact in the setting of stool filled colonic loops Vascular/Lymphatic: Abdominal aortic atherosclerotic changes without aneurysmal dilation of the abdominal aorta Other:  No ascites. Musculoskeletal: Post sternotomy. IMPRESSION: 1. Stable dilation of the main pancreatic duct particularly in the head of the pancreas. No focal, suspicious pancreatic lesion with stable contour dating back to 2019. No signs of pancreatic inflammation to indicate pancreatitis. Given main duct distension would consider a 12 month follow-up either with CT or MRI. 2. Biliary duct distension following cholecystectomy is stable compared to studies from 2019. 3. Ahaustral appearance of the colon is suggested in some areas along with mild colonic wall thickening. Correlate with any clinical signs of colitis, this could be due to artifact. 4. Mild hepatic steatosis. Electronically Signed   By: Donzetta KohutGeoffrey  Wile M.D.   On: 11/05/2019 12:21   MR ABDOMEN MRCP W WO CONTAST  Result Date: 11/05/2019 CLINICAL DATA:  Pancreatitis is suspected. Abnormal findings on recent CT of the chest, abdomen and pelvis EXAM: MRI ABDOMEN WITHOUT AND WITH CONTRAST (INCLUDING MRCP) TECHNIQUE: Multiplanar multisequence MR imaging of the abdomen was performed both before and after the administration of intravenous contrast. Heavily T2-weighted images of the biliary and pancreatic ducts were obtained, and three-dimensional MRCP images were rendered by post processing. CONTRAST:  5mL GADAVIST GADOBUTROL 1 MMOL/ML IV  SOLN COMPARISON:  Multiple prior studies, most recent exam none [id number], studies dating back to 2014 are available for comparison FINDINGS: Lower chest: Incidental imaging of the lung bases without consolidation or sign of pleural effusion. CT Hepatobiliary: Biliary duct distension following cholecystectomy is stable compared to studies from 2019. No focal, suspicious hepatic lesion portal vein and hepatic veins are patent. Mild hepatic steatosis. Pancreas: Stable dilation of the main pancreatic duct particularly in the head of the pancreas. No focal, suspicious pancreatic lesion with stable pancreatic contour dating back to 2019. Normal intrinsic T1 signal in pancreas which shows generalized, mild atrophy. Spleen:  Within normal limits in size and appearance. Adrenals/Urinary Tract: Adrenal glands are normal. Kidneys enhance symmetrically without focal, suspicious renal lesion. Stomach/Bowel: Gastrointestinal track showing question of mild colonic thickening and colonic wall edema. Stool in the colon limiting assessment. There is also subtle ahaustral change in the colon, this is subtle on today's study. And may be related to artifact in the setting of stool filled colonic loops Vascular/Lymphatic: Abdominal aortic atherosclerotic changes without aneurysmal dilation of the abdominal aorta Other:  No ascites. Musculoskeletal: Post sternotomy. IMPRESSION: 1. Stable dilation of the main pancreatic duct particularly in the head of the pancreas. No focal, suspicious pancreatic lesion with stable  contour dating back to 2019. No signs of pancreatic inflammation to indicate pancreatitis. Given main duct distension would consider a 12 month follow-up either with CT or MRI. 2. Biliary duct distension following cholecystectomy is stable compared to studies from 2019. 3. Ahaustral appearance of the colon is suggested in some areas along with mild colonic wall thickening. Correlate with any clinical signs of colitis, this could  be due to artifact. 4. Mild hepatic steatosis. Electronically Signed   By: Donzetta Kohut M.D.   On: 11/05/2019 12:21   CT Angio Chest/Abd/Pel for Dissection W and/or Wo Contrast  Result Date: 11/03/2019 CLINICAL DATA:  Chest pain and back pain, aortic dissection suspected. Blood pressure over 225. EXAM: CT ANGIOGRAPHY CHEST, ABDOMEN AND PELVIS TECHNIQUE: Non-contrast CT of the chest was initially obtained. Multidetector CT imaging through the chest, abdomen and pelvis was performed using the standard protocol during bolus administration of intravenous contrast. Multiplanar reconstructed images and MIPs were obtained and reviewed to evaluate the vascular anatomy. "Pt allergic to contrast- had 4 hour prep ( but is allergic to benedryl and prednisone so used alternate meds.)" CONTRAST:  OMNIPAQUE IOHEXOL 350 MG/ML SOLN COMPARISON:  CT coronary 05/06/2018, CT angio chest 04/22/2019, CT abdomen pelvis 07/13/2017, CT angio chest 01/30/2019. FINDINGS: CTA CHEST FINDINGS Cardiovascular: Preferential opacification of the thoracic aorta. Similar-appearing crescentic peripheral hypodensity along the aortic arch that originates just distal to left subclavian artery (10:55-57, 11:79-82) and extends to the origin of the descending thoracic aorta. This finding is present on prior CT chest 04/22/2019 and 01/30/2019; however, is very poorly visualized and evaluated on the prior studies due to non-optimization for the visualization of the aorta on these prior studies. No evidence of thoracic aortic aneurysm. No periaortic fat stranding. Normal heart size. No significant pericardial effusion. The main pulmonary artery is normal in caliber. No central or segmental pulmonary embolus. At least 4 vessel mild coronary artery calcifications in a patient status post coronary artery bypass graft. Mediastinum/Nodes: Calcified right hilar lymph node (7:78). Prominent but nonenlarged left hilar lymph node (7:70). No enlarged  mediastinal, hilar, or axillary lymph nodes. Thyroid gland, trachea, and esophagus demonstrate no significant findings. Lungs/Pleura: Few scattered calcified granuloma. Biapical pleural/pulmonary scarring. Few scattered pulmonary micro nodules. Right basilar 5 mm pulmonary nodule (7:129). Left basilar 4 mm pulmonary nodule (7:125). No pulmonary mass. No focal consolidation. No pleural effusion or pneumothorax. Musculoskeletal: No chest wall abnormality. No acute or significant osseous findings. No acute displaced fracture. Review of the MIP images confirms the above findings. CTA ABDOMEN AND PELVIS FINDINGS VASCULAR Aorta: Severe calcified and noncalcified atherosclerotic plaque. Normal caliber aorta without aneurysm, dissection, vasculitis or significant stenosis. No periaortic fat stranding. Celiac: Patent without evidence of aneurysm, dissection, vasculitis or significant stenosis. SMA: Patent without evidence of aneurysm, dissection, vasculitis or significant stenosis. Replaced right hepatic artery originating off of the superior mesenteric artery. Renals: The right renal artery is diminutive in size compared to the left. Both renal arteries are patent without evidence of aneurysm, dissection, vasculitis, fibromuscular dysplasia or significant stenosis. IMA: Patent without evidence of aneurysm, dissection, vasculitis or significant stenosis. Iliacs: Mild to moderate calcified and noncalcified atherosclerotic plaque. Patent without evidence of aneurysm, dissection, vasculitis or significant stenosis. Veins: No obvious venous abnormality within the limitations of this arterial phase study. Other: Mild soft tissue density along the right femoral vessels likely scarring for prior access. No aneurysm identified at this level. Review of the MIP images confirms the above findings. NON-VASCULAR Hepatobiliary: No focal liver  abnormality is seen. Hypodense appearance of the hepatic parenchyma mid to the splenic parenchyma  likely due to timing of contrast. Status post cholecystectomy. No biliary dilatation. Pancreas: Diffusely atrophic. Persistent main pancreatic duct dilatation within the head and neck of the pancreas measuring up to 5 mm. Spleen: Normal in size without focal abnormality. Adrenals/Urinary Tract: Adrenal glands are unremarkable. Kidneys are normal, without renal calculi, focal lesion, or hydronephrosis. Urinary bladder is unremarkable. Stomach/Bowel: Stomach is within normal limits. The appendix is not definitely identified. No evidence of bowel wall thickening, distention, or inflammatory changes. Diffuse sigmoid diverticulosis. Lymphatic: No abdominal, pelvic, inguinal lymphadenopathy. Reproductive: Status post hysterectomy. No adnexal masses. Other: No abdominal wall hernia or abnormality. No abdominopelvic ascites. No free intraperitoneal gas. No organized fluid collection. Musculoskeletal: No acute or significant osseous findings. Review of the MIP images confirms the above findings. IMPRESSION: 1. Similar-appearing peripheral crescentic hypodensity along the aortic arch that originates just distal to the left subclavian artery and terminates at the origin of the descending thoracic aorta that may represent a stable type B aortic dissection versus noncalcified thrombus in a patient with severe calcified and noncalcified atherosclerotic plaque of the aorta and its branches. No aorta aneurysm or intramural hematoma. 2. Persistent main pancreatic duct dilatation up to 5 mm within the head and neck of the pancreas. Findings may represent intraductal papillary mucinous neoplasm. 3. Couple of bibasilar pulmonary nodules measuring up to 5 mm in a patient demonstrating sequela of prior granulomatous disease. These results were called by telephone at the time of interpretation on 11/03/2019 at 9:20 pm to provider Dr. Jacqulyn Bath, who verbally acknowledged these results. Electronically Signed   By: Tish Frederickson M.D.   On:  11/03/2019 21:18      Subjective: - no chest pain, shortness of breath, no abdominal pain, nausea or vomiting.   Discharge Exam: BP (!) 131/51 (BP Location: Right Arm)   Pulse 61   Temp 97.8 F (36.6 C) (Oral)   Resp 18   Ht 5\' 4"  (1.626 m)   Wt 52.8 kg   SpO2 100%   BMI 19.98 kg/m   General: Pt is alert, awake, not in acute distress Cardiovascular: RRR, S1/S2 +, no rubs, no gallops Respiratory: CTA bilaterally, no wheezing, no rhonchi Abdominal: Soft, NT, ND, bowel sounds + Extremities: no edema, no cyanosis  The results of significant diagnostics from this hospitalization (including imaging, microbiology, ancillary and laboratory) are listed below for reference.     Microbiology: Recent Results (from the past 240 hour(s))  SARS Coronavirus 2 by RT PCR (hospital order, performed in [facility] hospital lab) Nasopharyngeal Nasopharyngeal Swab     Status: None   Collection Time: 11/03/19  3:34 PM   Specimen: Nasopharyngeal Swab  Result Value Ref Range Status   SARS Coronavirus 2 NEGATIVE NEGATIVE Final    Comment: (NOTE) SARS-CoV-2 target nucleic acids are NOT DETECTED.  The SARS-CoV-2 RNA is generally detectable in upper and lower respiratory specimens during the acute phase of infection. The lowest concentration of SARS-CoV-2 viral copies this assay can detect is 250 copies / mL. A negative result does not preclude SARS-CoV-2 infection and should not be used as the sole basis for treatment or other patient management decisions.  A negative result may occur with improper specimen collection / handling, submission of specimen other than nasopharyngeal swab, presence of viral mutation(s) within the areas targeted by this assay, and inadequate number of viral copies (<250 copies / mL). A negative result must be combined  with clinical observations, patient history, and epidemiological information.  Fact Sheet for Patients:    BoilerBrush.com.cy  Fact Sheet for Healthcare Providers: https://pope.com/  This test is not yet approved or  cleared by the [place] FDA and has been authorized for detection and/or diagnosis of SARS-CoV-2 by FDA under an Emergency Use Authorization (EUA).  This EUA will remain in effect (meaning this test can be used) for the duration of the COVID-19 declaration under Section 564(b)(1) of the Act, 21 U.S.C. section 360bbb-3(b)(1), unless the authorization is terminated or revoked sooner.  Performed at [facility], [address], [place], [place] [address]   Urine culture     Status: Abnormal   Collection Time: 11/03/19  3:46 PM   Specimen: Urine, Random  Result Value Ref Range Status   Specimen Description URINE, RANDOM  Final   Special Requests NONE  Final   Culture (A)  Final    <10,000 COLONIES/mL INSIGNIFICANT GROWTH Performed at [facility], [address], [place], [place] [address]    Report Status 11/05/2019 FINAL  Final     Labs: Basic Metabolic Panel: Recent Labs  Lab 11/03/19 1252 11/04/19 0617 11/05/19 0358  NA 139 138 140  K 4.2 3.6 3.8  CL 106 105 109  CO2 24 23 23   GLUCOSE 96 113* 94  BUN 14 14 13   CREATININE 0.91 0.80 0.81  CALCIUM 9.9 9.8 9.2  MG  --  2.0  --    Liver Function Tests: Recent Labs  Lab 11/03/19 1528 11/04/19 0617 11/05/19 0358  AST 42* 21 18  ALT 21 19 18   ALKPHOS 53 52 45  BILITOT 1.0 0.3 0.6  PROT 7.0 6.4* 5.7*  ALBUMIN 4.2 3.6 3.3*   CBC: Recent Labs  Lab 11/03/19 1252 11/04/19 0617 11/05/19 0358 11/06/19 0630 11/07/19 0543  WBC 6.9 8.0 6.3 9.1 7.5  HGB 12.6 12.4 11.8* 11.8* 12.4  HCT 39.3 36.9 35.7* 35.3* 37.5  MCV 95.6 92.5 93.9 94.9 93.8  PLT 218 205 193 207 225   CBG: No results for input(s): GLUCAP in the last 168 hours. Hgb A1c No results for input(s): HGBA1C in the last 72 hours. Lipid Profile No results for input(s): CHOL, HDL,  LDLCALC, TRIG, CHOLHDL, LDLDIRECT in the last 72 hours. Thyroid function studies No results for input(s): TSH, T4TOTAL, T3FREE, THYROIDAB in the last 72 hours.  Invalid input(s): FREET3 Urinalysis    Component Value Date/Time   COLORURINE STRAW (A) 11/03/2019 1600   APPEARANCEUR CLEAR 11/03/2019 1600   LABSPEC 1.005 11/03/2019 1600   PHURINE 7.0 11/03/2019 1600   GLUCOSEU NEGATIVE 11/03/2019 1600   HGBUR SMALL (A) 11/03/2019 1600   BILIRUBINUR NEGATIVE 11/03/2019 1600   BILIRUBINUR negative 08/20/2019 1118   KETONESUR NEGATIVE 11/03/2019 1600   PROTEINUR NEGATIVE 11/03/2019 1600   UROBILINOGEN 0.2 08/20/2019 1118   NITRITE NEGATIVE 11/03/2019 1600   LEUKOCYTESUR NEGATIVE 11/03/2019 1600    FURTHER DISCHARGE INSTRUCTIONS:   Get Medicines reviewed and adjusted: Please take all your medications with you for your next visit with your Primary MD   Laboratory/radiological data: Please request your Primary MD to go over all hospital tests and procedure/radiological results at the follow up, please ask your Primary MD to get all Hospital records sent to his/her office.   In some cases, they will be blood work, cultures and biopsy results pending at the time of your discharge. Please request that your primary care M.D. goes through all the records of  your hospital data and follows up on these results.   Also Note the following: If you experience worsening of your admission symptoms, develop shortness of breath, life threatening emergency, suicidal or homicidal thoughts you must seek medical attention immediately by calling 911 or calling your MD immediately  if symptoms less severe.   You must read complete instructions/literature along with all the possible adverse reactions/side effects for all the Medicines you take and that have been prescribed to you. Take any new Medicines after you have completely understood and accpet all the possible adverse reactions/side effects.    Do not  drive when taking Pain medications or sleeping medications (Benzodaizepines)   Do not take more than prescribed Pain, Sleep and Anxiety Medications. It is not advisable to combine anxiety,sleep and pain medications without talking with your primary care practitioner   Special Instructions: If you have smoked or chewed Tobacco  in the last 2 yrs please stop smoking, stop any regular Alcohol  and or any Recreational drug use.   Wear Seat belts while driving.   Please note: You were cared for by a hospitalist during your hospital stay. Once you are discharged, your primary care physician will handle any further medical issues. Please note that NO REFILLS for any discharge medications will be authorized once you are discharged, as it is imperative that you return to your primary care physician (or establish a relationship with a primary care physician if you do not have one) for your post hospital discharge needs so that they can reassess your need for medications and monitor your lab values.  Time coordinating discharge: 35 minutes  SIGNED:  Pamella Pert, MD, PhD 11/08/2019, 8:06 AM

## 2019-11-10 ENCOUNTER — Telehealth: Payer: Self-pay | Admitting: Family Medicine

## 2019-11-10 ENCOUNTER — Ambulatory Visit (INDEPENDENT_AMBULATORY_CARE_PROVIDER_SITE_OTHER): Payer: Medicare Other | Admitting: Family Medicine

## 2019-11-10 ENCOUNTER — Other Ambulatory Visit: Payer: Self-pay

## 2019-11-10 VITALS — BP 130/56 | HR 72 | Temp 97.3°F | Resp 16 | Ht 64.0 in | Wt 119.0 lb

## 2019-11-10 DIAGNOSIS — I161 Hypertensive emergency: Secondary | ICD-10-CM | POA: Diagnosis not present

## 2019-11-10 DIAGNOSIS — E039 Hypothyroidism, unspecified: Secondary | ICD-10-CM

## 2019-11-10 DIAGNOSIS — G8929 Other chronic pain: Secondary | ICD-10-CM

## 2019-11-10 DIAGNOSIS — R2681 Unsteadiness on feet: Secondary | ICD-10-CM

## 2019-11-10 DIAGNOSIS — R1013 Epigastric pain: Secondary | ICD-10-CM | POA: Diagnosis not present

## 2019-11-10 DIAGNOSIS — K5900 Constipation, unspecified: Secondary | ICD-10-CM | POA: Diagnosis not present

## 2019-11-10 NOTE — Patient Instructions (Addendum)
Zenpep: 1 prior to meals and snacks scheduled.. MiraLAX: 1 dose twice daily scheduled. Change losartan to 100 mg half pill twice daily. Probiotic: recommend florq Q if wishes a probiotic.  Referral to home health care for physical therapy due to weakness.

## 2019-11-10 NOTE — Chronic Care Management (AMB) (Signed)
°  Chronic Care Management   Note  11/10/2019 Name: Leah Pittman MRN: [id number] DOB: February 01, 1931  Leah Pittman is a 86 y.o. year old female who is a primary care patient of [name], [name], MD. I reached out to [name] by phone today in response to a referral sent by Leah Pittman's PCP, [name], [name], MD.   Leah Pittman was given information about Chronic Care Management services today including:  1. CCM service includes personalized support from designated clinical staff supervised by her physician, including individualized plan of care and coordination with other care providers 2. 24/7 contact phone numbers for assistance for urgent and routine care needs. 3. Service will only be billed when office clinical staff spend 20 minutes or more in a month to coordinate care. 4. Only one practitioner may furnish and bill the service in a calendar month. 5. The patient may stop CCM services at any time (effective at the end of the month) by phone call to the office staff.   Patient agreed to services and verbal consent obtained.   Follow up plan: Aggie Hacker  Upstream Scheduler

## 2019-11-10 NOTE — Progress Notes (Signed)
Subjective:  Patient ID: Leah Pittman, female    DOB: [birth year]  Age: 86 y.o. MRN: [id number]  Chief Complaint  Patient presents with  . Hypertension  . Abdominal Pain    HPI  Patient was admitted on 11/03/2019 through Discharge 9/18. Presented in hypertensive crisis (220/120.) She presented with CP, SOB, Fatigue, and epigastric pain.  Patient had epigastric tenderness: CT showed aortic atherosclerosis and atrophic pancreatitis. MRCP done on 11/05/2019 showed a dilated duct. Recommended repeat in 12 months. Constipation: miralax, stool softener, and enema without improvement. Finally Friday evening and she was able to have a good BM.   Patient feels weak. Feels like she is off balance and abnormal gait.  Current Outpatient Medications on File Prior to Visit  Medication Sig Dispense Refill  . acetaminophen (TYLENOL) 325 MG tablet Take 975 mg by mouth every 8 (eight) hours as needed for mild pain or headache.     Marland Kitchen aspirin 81 MG tablet Take 81 mg by mouth daily.    . Calcium Citrate-Vitamin D (CALCIUM CITRATE+D3 PETITES PO) Take 1 tablet by mouth daily.    . dorzolamide-timolol (COSOPT) 22.3-6.8 MG/ML ophthalmic solution Place 1 drop into the left eye 2 (two) times daily.     Marland Kitchen latanoprost (XALATAN) 0.005 % ophthalmic solution Place 1 drop into both eyes at bedtime.     Marland Kitchen levothyroxine (SYNTHROID) 100 MCG tablet TAKE 1 TABLET BY MOUTH EVERY DAY (Patient taking differently: Take 100 mcg by mouth daily before breakfast. ) 90 tablet 0  . LORazepam (ATIVAN) 1 MG tablet TAKE 1 TABLET(1 MG) BY MOUTH TWICE DAILY AS NEEDED FOR ANXIETY (Patient taking differently: Take 0.5 mg by mouth 2 (two) times daily as needed for anxiety. ) 60 tablet 1  . losartan (COZAAR) 100 MG tablet Take 1 tablet (100 mg total) by mouth daily. 30 tablet 1  . magnesium oxide (MAG-OX) 400 MG tablet Take 400 mg by mouth daily. Chewable    . nitroGLYCERIN (NITROSTAT) 0.4 MG SL tablet Place 0.4 mg under the tongue every 5  (five) minutes as needed for chest pain.     Marland Kitchen ondansetron (ZOFRAN) 8 MG tablet Take 1 tablet (8 mg total) by mouth 2 (two) times daily as needed for nausea or vomiting. 40 tablet 1  . pantoprazole (PROTONIX) 40 MG tablet Take 1 tablet (40 mg total) by mouth daily. 90 tablet 0  . ranolazine (RANEXA) 500 MG 12 hr tablet TAKE 1 TABLET BY MOUTH TWICE A DAY 180 tablet 1  . simvastatin (ZOCOR) 20 MG tablet TAKE 1 TABLET BY MOUTH EVERYDAY AT BEDTIME (Patient taking differently: Take 20 mg by mouth at bedtime. ) 90 tablet 0  . Vitamin D, Cholecalciferol, 50 MCG (2000 UT) CAPS Take 1 tablet by mouth daily.    . Probiotic Product (PROBIOTIC DAILY PO) Take 1 capsule by mouth daily. (Patient not taking: Reported on 11/10/2019)     No current facility-administered medications on file prior to visit.   Past Medical History:  Diagnosis Date  . Anxiety   . Arthritis   . Chest pain 2011   CARDIOLITE - no significant symptoms, EKG changes, or arrhythmias  . Dyspnea 02/16/2012   2D ECHO - EF 55-60%, normal  . Fatigue   . Glaucoma   . Headache(784.0)   . Hiatal hernia   . High blood pressure 02/16/2012   RENAL DOPPLER - normal  . Hyperlipidemia   . Hypothyroidism   . Labile hypertension   . Lightheadedness   .  Migraine headache   . Multiple allergies   . Myalgia   . Pain, lower leg    Calf  . Problem of menstruation   . Reflux   . SOB (shortness of breath)   . Thyroid disease   . Urinary problem    Past Surgical History:  Procedure Laterality Date  . ABDOMINAL HYSTERECTOMY  1976  . CHOLECYSTECTOMY  2000  . CORONARY ARTERY BYPASS GRAFT N/A 07/10/2018   Procedure: CORONARY ARTERY BYPASS GRAFTING (CABG), FREE LIMA;  Surgeon: [name], MD;  Location: [facility] OR;  Service: Open Heart Surgery;  Laterality: N/A;  . CYSTECTOMY  1974   Intestine  . CYSTECTOMY  1996   Brain stem  . EYE SURGERY  2003  . LEFT HEART CATH AND CORONARY ANGIOGRAPHY N/A 07/08/2018   Procedure: LEFT HEART CATH AND  CORONARY ANGIOGRAPHY;  Surgeon: Runell Gess, MD;  Location: [facility] INVASIVE CV [facility];  Service: Cardiovascular;  Laterality: N/A;  . LEFT HEART CATH AND CORONARY ANGIOGRAPHY N/A 07/25/2019   Procedure: LEFT HEART CATH AND CORONARY ANGIOGRAPHY;  Surgeon: Kathleene Hazel, MD;  Location: [facility] INVAS[facility] CV [facility];  Service: Cardiovascular;  Laterality: N/A;  . RADIOLOGY WITH ANESTHESIA N/A 11/05/2019   Procedure: MRI WITH ANESTHESIA;  Surgeon: Radiologist, Medication, MD;  Location: [facility] OR;  Service: Radiology;  Laterality: N/A;  . TEE WITHOUT CARDIOVERSION N/A 07/10/2018   Procedure: TRANSESOPHAGEAL ECHOCARDIOGRAM (TEE);  Surgeon: [name], MD;  Location: [facility];  Service: Open Heart Surgery;  Laterality: N/A;    Family History  Problem Relation Age of Onset  . Heart attack Father   . Stroke Father   . Cancer Sister   . Cancer Brother   . Asthma Brother   . Cancer Brother    Social History   Socioeconomic History  . Marital status: Married    Spouse name: Not on file  . Number of children: 3  . Years of education: college  . Highest education level: Not on file  Occupational History  . Occupation: Retired  Tobacco Use  . Smoking status: Never Smoker  . Smokeless tobacco: Never Used  Vaping Use  . Vaping Use: Never used  Substance and Sexual Activity  . Alcohol use: No  . Drug use: No  . Sexual activity: Not on file  Other Topics Concern  . Not on file  Social History Narrative   Lives with husband in [place], [place]   Social Determinants of Health   Financial Resource Strain:   . Difficulty of Paying Living Expenses: Not on file  Food Insecurity:   . Worried About Programme researcher, broadcasting/film/video in the Last Year: Not on file  . Ran Out of Food in the Last Year: Not on file  Transportation Needs:   . Lack of Transportation (Medical): Not on file  . Lack of Transportation (Non-Medical): Not on file  Physical Activity:   . Days of Exercise per Week: Not on file  . Minutes of  Exercise per Session: Not on file  Stress:   . Feeling of Stress : Not on file  Social Connections:   . Frequency of Communication with Friends and Family: Not on file  . Frequency of Social Gatherings with Friends and Family: Not on file  . Attends Religious Services: Not on file  . Active Member of Clubs or Organizations: Not on file  . Attends Banker Meetings: Not on file  . Marital Status: Not on file    Review  of Systems  Constitutional: Positive for chills. Negative for fatigue and fever.  HENT: Negative for congestion, ear pain and sore throat.   Respiratory: Positive for shortness of breath (with ambulation). Negative for cough.   Cardiovascular: Negative for chest pain.  Gastrointestinal: Positive for abdominal pain (epigastric.). Negative for constipation, diarrhea, nausea and vomiting.  Genitourinary: Negative for dysuria and urgency.  Musculoskeletal: Negative for arthralgias and myalgias.  Skin: Negative for rash.  Neurological: Negative for dizziness and headaches.  Psychiatric/Behavioral: Negative for dysphoric mood. The patient is not nervous/anxious.      Objective:  BP (!) 130/56   Pulse 72   Temp (!) 97.3 F (36.3 C)   Resp 16   Ht 5\' 4"  (1.626 m)   Wt 119 lb (54 kg)   BMI 20.43 kg/m   BP/Weight 11/10/2019 11/08/2019 11/03/2019  Systolic BP 130 116 -  Diastolic BP 56 41 -  Wt. (Lbs) 119 116.4 -  BMI 20.43 - 19.98    Physical Exam Vitals reviewed.  Constitutional:      Appearance: Normal appearance. She is normal weight.  Cardiovascular:     Rate and Rhythm: Normal rate and regular rhythm.     Pulses: Normal pulses.     Heart sounds: Normal heart sounds.  Pulmonary:     Effort: Pulmonary effort is normal. No respiratory distress.     Breath sounds: Normal breath sounds.  Abdominal:     General: Abdomen is flat. Bowel sounds are normal.     Palpations: Abdomen is soft.     Tenderness: There is generalized abdominal tenderness.    Neurological:     Mental Status: She is alert and oriented to person, place, and time.  Psychiatric:        Mood and Affect: Mood normal.        Behavior: Behavior normal.     Diabetic Foot Exam - Simple   No data filed       Lab Results  Component Value Date   WBC 7.5 11/07/2019   HGB 12.4 11/07/2019   HCT 37.5 11/07/2019   PLT 225 11/07/2019   GLUCOSE 94 11/05/2019   CHOL 206 (H) 11/04/2019   TRIG 86 11/04/2019   HDL 69 11/04/2019   LDLCALC 120 (H) 11/04/2019   ALT 18 11/05/2019   AST 18 11/05/2019   NA 140 11/05/2019   K 3.8 11/05/2019   CL 109 11/05/2019   CREATININE 0.81 11/05/2019   BUN 13 11/05/2019   CO2 23 11/05/2019   TSH 6.815 (H) 11/03/2019   INR 1.0 11/03/2019   HGBA1C 5.8 (H) 07/10/2018      Assessment & Plan:  1. Hypertensive emergency ResolvedChange losartan to 100 mg half pill twice daily.  2. Constipation, unspecified constipation type Start on miralax one dose twice a day.  Probiotic: recommend florq Q if wishes a probiotic.   3. Hypothyroidism, unspecified type Repeat in 6 weeks  4. Abdominal pain, chronic, epigastric Concerning for chronic pancreatitis.  Start on zenpep samples given. 20000/63000 one prior to meals and snacks..   5. Unsteady gait - referral to home health care for PT.  Follow-up: Return in about 6 weeks (around 12/22/2019) for chronic visit. 13/02/2019  An After Visit Summary was printed and given to the patient.  Marland Kitchen [facility] [phone]

## 2019-11-12 ENCOUNTER — Institutional Professional Consult (permissible substitution): Payer: Medicare Other | Admitting: Pulmonary Disease

## 2019-11-14 ENCOUNTER — Telehealth: Payer: Self-pay

## 2019-11-14 NOTE — Telephone Encounter (Signed)
Leah Pittman called to report that she is having a difficult time with her bowels.  She has been taking miralax with poor results.  Dr. Sedalia Muta advised that she stop this and begin lactulose.

## 2019-11-16 ENCOUNTER — Encounter: Payer: Self-pay | Admitting: Family Medicine

## 2019-11-16 MED ORDER — LEVOTHYROXINE SODIUM 100 MCG PO TABS
100.0000 ug | ORAL_TABLET | Freq: Every day | ORAL | 0 refills | Status: DC
Start: 2019-11-16 — End: 2020-03-24

## 2019-11-16 MED ORDER — LACTULOSE 20 GM/30ML PO SOLN: 30.0000 mL | ORAL | 0 refills | Status: DC

## 2019-11-16 MED ORDER — ZENPEP 20000-63000 UNITS PO CPEP
ORAL_CAPSULE | ORAL | 0 refills | Status: DC
Start: 2019-11-16 — End: 2019-12-14

## 2019-11-16 MED ORDER — LORAZEPAM 1 MG PO TABS: 0.5000 mg | ORAL_TABLET | Freq: Two times a day (BID) | ORAL | 0 refills | Status: DC | PRN

## 2019-11-19 ENCOUNTER — Telehealth: Payer: Self-pay | Admitting: Cardiovascular Disease

## 2019-11-19 NOTE — Telephone Encounter (Signed)
lvm for patient to return call to get follow up scheduled with [facility] from recall list 

## 2019-11-24 NOTE — Chronic Care Management (AMB) (Signed)
Chronic Care Management Pharmacy  Name: Leah Pittman  MRN: [id number] DOB: 25-Nov-1930  Chief Complaint/ HPI  Leah Pittman,  86 y.o. , female presents for their Initial CCM visit with the clinical pharmacist In office.  PCP : Rochel Brome, MD  Their chronic conditions include: hypertension, migraine headache, CAD, NSTEMI, COPD, anxiety, glucaoma, constipation, epigastric pain.   Office Visits: 11/14/2019 - Miralax not working. Stop Miralax and begin latulose.  11/10/2019 - change losartan to 100 mg half tablet twice daily. Start Miralax bid. Recommend flora q probiotic. Start on zenpep samples given 20000/63000 prior to meals and snacks. PT referral for unsteady gait.  08/20/2019 - f/u in office after ED visit for constipation. do not take pain medication due to constipation - water, fiber, miralax, stool softeners. Refer to GI. Refer to pulmonology to assess dyspnea on exertion/failed PFTs in office. Refer to ENT for hoarseness.  08/06/2019 - Start on Ranexa 500 mg bid for CAD. F/U with cardiology. Constipation stop lactulose and start amitiza 8 mg bid.  Consult Visit: 11/03/19: ED to hospital for hypertensive crisis.  09/03/2019 - Opthamology - Latanoprost x1 OU. Dorzolamide-Timolol x2 OS. Artificial tears prn.  07/24/2019 - ED to hospital admission. Chest pain. Cardiac catherization performed. Prox RCA to Mid RCA lesion is 40% stenosed.  Ost LM to Mid LM lesion is 60% stenosed.  Mid Cx lesion is 99% stenosed 07/23/2019 - Opthamology Dr Edilia Bo - Start Latanoprost x1 OU. D/c Travoprost x1 OU qam. Dorzolamide-Timolol x2 OS 04/23/2019 - Opthamology Dr Edilia Bo - Travoprost x1 OU qam, Dorzolamide-timolol x2 OS. Artifical tears prn. Return in 3 months.  04/04/2019 - Cardiology - no recurrence of afib since immediate postop without treatment. Increase losartan 50 mg daily. BP elevated at appointment. Lipids at target goal.  Medications: Outpatient Encounter Medications as of 09/29/2019    Medication Sig  . acetaminophen (TYLENOL) 325 MG tablet Take 975 mg by mouth every 8 (eight) hours as needed for mild pain or headache.   Marland Kitchen aspirin 81 MG tablet Take 81 mg by mouth daily.  . Calcium Citrate-Vitamin D (CALCIUM CITRATE+D3 PETITES PO) Take 1 tablet by mouth daily.  [name] Sodium (STOOL SOFTENER LAXATIVE PO) Take by mouth.  . dorzolamide-timolol (COSOPT) 22.3-6.8 MG/ML ophthalmic solution Place 1 drop into the left eye 2 (two) times daily.   Marland Kitchen HYDROcodone-acetaminophen (NORCO/VICODIN) 5-325 MG tablet Take 1 tablet by mouth every 6 (six) hours as needed for moderate pain.  Marland Kitchen latanoprost (XALATAN) 0.005 % ophthalmic solution Place 1 drop into the left eye at bedtime.  Marland Kitchen levothyroxine (SYNTHROID) 100 MCG tablet TAKE 1 TABLET BY MOUTH EVERY DAY (Patient taking differently: Take 100 mcg by mouth daily. )  . LORazepam (ATIVAN) 1 MG tablet TAKE 1 TABLET(1 MG) BY MOUTH TWICE DAILY AS NEEDED FOR ANXIETY  . losartan (COZAAR) 25 MG tablet TAKE 1 TABLET(25 MG) BY MOUTH 1 TIME PER DAY (Patient taking differently: Take 25 mg by mouth daily. )  . magnesium oxide (MAG-OX) 400 MG tablet Take 400 mg by mouth daily.  . nitroGLYCERIN (NITROSTAT) 0.4 MG SL tablet Place 0.4 mg under the tongue every 5 (five) minutes as needed for chest pain.   Marland Kitchen ondansetron (ZOFRAN) 8 MG tablet Take 1 tablet (8 mg total) by mouth 2 (two) times daily as needed for nausea or vomiting.  . pantoprazole (PROTONIX) 40 MG tablet Take 1 tablet (40 mg total) by mouth daily.  . Probiotic Product (PROBIOTIC DAILY PO) Take 1 capsule by mouth daily.  Marland Kitchen  ranolazine (RANEXA) 500 MG 12 hr tablet TAKE 1 TABLET BY MOUTH TWICE A DAY  . simvastatin (ZOCOR) 20 MG tablet TAKE 1 TABLET BY MOUTH EVERYDAY AT BEDTIME  . Vitamin D, Cholecalciferol, 50 MCG (2000 UT) CAPS Take 1 tablet by mouth daily.   No facility-administered encounter medications on file as of 09/29/2019.   Allergies  Allergen Reactions  . Benadryl [Diphenhydramine]  Swelling and Other (See Comments)    Tongue swells and hallucinates- could not talk  . Codeine Nausea And Vomiting and Hypertension  . Meperidine Nausea Only, Swelling and Other (See Comments)    Tongue swells and "I feel like death"  . Prednisone     Hypertension   . Promethazine Other (See Comments)    Hypotension   . Propranolol Nausea And Vomiting  . Shellfish Allergy Nausea And Vomiting  . Tape Other (See Comments)    Tears the skin  . Tazarotene Other (See Comments)    Reaction not recalled   . Iodinated Diagnostic Agents Nausea Only and Rash   SDOH Screenings   Alcohol Screen:   . Last Alcohol Screening Score (AUDIT):   Depression (PHQ2-9):   . PHQ-2 Score:   Financial Resource Strain:   . Difficulty of Paying Living Expenses:   Food Insecurity:   . Worried About Charity fundraiser in the Last Year:   . Jenkins in the Last Year:   Housing:   . Last Housing Risk Score:   Physical Activity:   . Days of Exercise per Week:   . Minutes of Exercise per Session:   Social Connections:   . Frequency of Communication with Friends and Family:   . Frequency of Social Gatherings with Friends and Family:   . Attends Religious Services:   . Active Member of Clubs or Organizations:   . Attends Archivist Meetings:   Marland Kitchen Marital Status:   Stress:   . Feeling of Stress :   Tobacco Use: Low Risk   . Smoking Tobacco Use: Never Smoker  . Smokeless Tobacco Use: Never Used  Transportation Needs:   . Film/video editor (Medical):   Marland Kitchen Lack of Transportation (Non-Medical):      Current Diagnosis/Assessment:  Goals Addressed              This Visit's Progress   .  Pharmacy Care Plan (pt-stated)        CARE PLAN ENTRY (see longitudinal plan of care for additional care plan information)  Current Barriers:  . Chronic Disease Management support, education, and care coordination needs related to Constipation, Hypertension and Hyperlipidemia    Hypertension BP Readings from Last 3 Encounters:  11/10/19 (!) 130/56  11/08/19 (!) 116/41  08/20/19 136/70   . Pharmacist Clinical Goal(s): o Over the next 90 days, patient will work with PharmD and providers to maintain BP goal <130/80 . Current regimen:  o Losartan 25 mg daily  . Interventions: o Reviewed home blood pressure readings.  o Reviewed and update blood pressure medications.  o Coordinating refills sent to pharmacy for delivery with Dr. Tobie Poet.  . Patient self care activities - Over the next 90 days, patient will: o Check BP daily, document, and provide at future appointments o Ensure daily salt intake < 2300 mg/day  Hyperlipidemia Lab Results  Component Value Date/Time   LDLCALC 120 (H) 11/04/2019 06:17 AM   LDLCALC 69 09/23/2018 02:12 PM   . Pharmacist Clinical Goal(s): o Over the next 90 days, patient  will work with PharmD and providers to achieve LDL goal < 70 . Current regimen:  o Simvastatin 20 mg daily  o Aspirin 81 mg daily . Interventions: o Reviewed home medications.  o Corordinating medication sent to pharmacy for delivery.  . Patient self care activities - Over the next 90 days, patient will: o Continue to take simvastatin 20 mg daily as prescribed.  o Continue to avoid fried foods.   Constipation  . Pharmacist Clinical Goal(s): o Over the next 90 days, patient will work with PharmD and providers to achieve improved bowel habits . Current regimen:  o Docusate 100 mg daily prn o Lactulase 20 gm/30 ml daily . Interventions: o Coordinating pharmacy delivery for lactulose. Patient was unable to get prescription when called in.  o Researched medication that worked well for patient in the hospital. Pharmacist discussing Linzess with Dr. Tobie Poet.  . Patient self care activities - Over the next 90 days, patient will: o Consider beginning [place] for constipation if approved by provider  Medication management . Pharmacist Clinical Goal(s): o Over the next  90 days, patient will work with PharmD and providers to achieve optimal medication adherence . [facility]  . Interventions o Comprehensive medication review performed. o Utilize [facility] for medication synchronization, packaging and delivery . Patient self care activities - Over the next 90 days, patient will: o Focus on medication adherence by using pill box o Take medications as prescribed o Report any questions or concerns to PharmD and/or provider(s)  Initial goal documentation        Hypertension   BP today is:  <130/80  Office blood pressures are  BP Readings from Last 3 Encounters:  08/20/19 136/70  08/06/19 130/78  07/27/19 (!) 121/36    Patient has failed these meds in the past: lisinopril, bystolic, clonidine Patient is currently controlled on the following medications:   Losartan 25 mg daily  Patient checks BP at home daily  Patient home BP readings are ranging: 123/45  We discussed diet and exercise extensively. Patient reports that her blood pressure was getting to low on her hospital discharge dose of losartan 100 mg daily. She reports taking 25 mg daily right now due to low blood pressure. Dr. Alyse Low last note shows 50 mg bid. Patient reports that she was in the hospital for elevated blood pressure last month.   Plan  Continue current medications   Hypothyroidism   Lab Results  Component Value Date/Time   TSH 3.870 08/20/2019 12:02 PM   TSH 0.441 (L) 09/23/2018 02:12 PM    Patient has failed these meds in past: pravastatin Patient is currently controlled on the following medications:  . Levothyroxine 100 mcg daily  We discussed:  Patient reports good administration technique. Stores on her bedside table and takes first thing each morning before food and other medications.   Plan  Continue current medications   CAD with History of NSTEMI   LDL goal < 70  Lipid Panel     Component Value Date/Time   CHOL 170 09/23/2018  1412   TRIG 185 (H) 09/23/2018 1412   HDL 64 09/23/2018 1412   LDLCALC 69 09/23/2018 1412    Hepatic Function Latest Ref Rng & Units 08/06/2019 07/15/2018 05/10/2018  Total Protein 6.0 - 8.5 g/dL 6.7 4.8(L) 7.2  Albumin 3.6 - 4.6 g/dL 4.2 2.7(L) 4.2  AST 0 - 40 IU/L $Remov'13 27 24  'KpqPKP$ ALT 0 - 32 IU/L 12 54(H) 26  Alk Phosphatase 48 - 121  IU/L 74 48 64  Total Bilirubin 0.0 - 1.2 mg/dL 0.3 0.9 0.6     The ASCVD Risk score Mikey Bussing DC Jr., et al., 2013) failed to calculate for the following reasons:   The 2013 ASCVD risk score is only valid for ages 55 to 85   The patient has a prior MI or stroke diagnosis   Patient has failed these meds in past: none reported Patient is currently controlled on the following medications:  . Aspirin 81 mg daily . Nitroglycerin 0.4 mg prn chest pain . Ranolazine 500 mg bid . Simvastatin 20 mg daily at bedtime  We discussed:  diet and exercise extensively   Adherence: Patient reports good adherence to simvastatin. She uses a pill box each night. Patient cannot find her Ranexa in her medications that she presents with in office today. She is going to check at home and talk to daughter. Pharmacist working to coordinate delivery service to improve access to care.   Plan  Continue current medications   Constipation   Patient has failed these meds in past: senna, bisacodyl Patient is currently uncontrolled on the following medications:  . Docusate 100 mg daily prn constipation.  . Lactulose 20 grams daily - patient has not picked up yet. Pharmacist coordinating delivery.   We discussed:  Patient struggles with constipation. She feels this could be coming from a problem with her pancreas.She has tried/failed Miralax in the past. Patient also reports that she stops taking when she has 1-2 days of bowel movements. Pharmacist encouraged patient to begin taking Lactulose once received. Pharmacist coordinating delivery of lactulose.   Patient states that there was a  capsule that worked well for her in the hospital. According to hospital medication administration records she received 3 doses of Linzess capsule. Pharmacist will discuss with Dr. Tobie Poet to see if patient could consider using [facility] outpatient.   Plan  Continue current medications  Epigastric Pain   Patient has failed these meds in past: maalox, dicyclomine, omeprazole Patient is currently uncontrolled on the following medications:  . Pantoprazole 40 mg daily . Zenpep before meals/snacks  - samples provided in office  We discussed:   She has tried samples of Zenpep for feeling of pressure/fullness after small amounts of food. She has had some chest pain due to pressure building up in her chest. She is waiting for an appointment with a digestive specialist that her heart doctor is helping to coordinate.   Patient reports that she has tried samples twice daily for a few days. Encouraged patient to add Zenpep before each meal as directed and let pharmacist know if that helps alleviate any symptoms.     Plan  Continue current medications. Patient coordinating appointment with digestive specialist.     Pain   Patient has failed these meds in past: oxycodone Patient is currently controlled on the following medications:  . Acetaminophen 975 mg every 8 hours prn pain or headache . Hydrocodone-apap 5-325 mg every 6 hours prn moderate pain  We discussed:  Patient reports rarely using prn pain medication.   Plan  Continue current medications  Glaucoma   Patient has failed these meds in past: travopost, timolol Patient is currently controlled on the following medications:  Marland Kitchen Dorzolamide-timolol eye drops 1 drop into the left eye 2 times daily . Latanoprost 0.005% left eye at bedtime  We discussed:  Patient managed by Dr. Edilia Bo. Reports tolerating eye drops well and regular follow-up with provider.   Plan  Continue current medications  Osteopenia / Osteoporosis   Patient has  failed these meds in past: alendronate Patient is currently controlled on the following medications:  . Calcium citrate 250 unit with vitamin d daily  . Vitamin d 2000 units daily   We discussed:  Recommend [phone] units of vitamin D daily. Recommend 1200 mg of calcium daily from dietary and supplemental sources. Recommend weight-bearing and muscle strengthening exercises for building and maintaining bone density.  Plan  Continue current medications  Health Maintenance   Patient is currently controlled on the following medications:  . Probiotic daily - supplement . Magnesium 400 mg daily - supplement  We discussed:  Patient takes daily for supplementation.   Physical therapy reached out for recent referral. Patient did not set up physical therapy yet for gait abnormality/balance issues. Pharmacist encouraged patient to schedule appointment to avoid falls. Patient states she will call back today to schedule.   Plan  Continue current medications  Vaccines   Will review and discuss patient's vaccination history at future visit.    There is no immunization history on file for this patient.  Plan  Recommended patient receive annual flu vaccine in office.   Medication Management   [facility] Uses pill box? Yes   We discussed: Patient no longer drive. Daughter fills her pill box up each week. Husband helps pick up medications. Patient would like to begin using vials   Verbal consent obtained for [facility] enhanced pharmacy services (medication synchronization, adherence packaging, delivery coordination). A medication sync plan was created to allow patient to get all medications delivered once every 30 to 90 days per patient preference. Patient understands they have freedom to choose pharmacy and clinical pharmacist will coordinate care between all prescribers and [facility].    Plan  Utilize [facility] pharmacy for medication  synchronization, packaging and delivery    Follow up: 1 month phone visit

## 2019-11-25 ENCOUNTER — Ambulatory Visit: Payer: Medicare Other

## 2019-11-25 ENCOUNTER — Other Ambulatory Visit: Payer: Self-pay

## 2019-11-25 DIAGNOSIS — I2510 Atherosclerotic heart disease of native coronary artery without angina pectoris: Secondary | ICD-10-CM

## 2019-11-25 DIAGNOSIS — K5900 Constipation, unspecified: Secondary | ICD-10-CM

## 2019-11-25 DIAGNOSIS — I10 Essential (primary) hypertension: Secondary | ICD-10-CM

## 2019-11-25 NOTE — Patient Instructions (Addendum)
Visit Information  Thank you for your time discussing your medications. I look forward to working with you to achieve your health care goals. Below is a summary of what we talked about during our visit.   Goals Addressed              This Visit's Progress   .  Pharmacy Care Plan (pt-stated)        CARE PLAN ENTRY (see longitudinal plan of care for additional care plan information)  Current Barriers:  . Chronic Disease Management support, education, and care coordination needs related to Constipation, Hypertension and Hyperlipidemia   Hypertension BP Readings from Last 3 Encounters:  11/10/19 (!) 130/56  11/08/19 (!) 116/41  08/20/19 136/70   . Pharmacist Clinical Goal(s): o Over the next 90 days, patient will work with PharmD and providers to maintain BP goal <130/80 . Current regimen:  o Losartan 25 mg daily  . Interventions: o Reviewed home blood pressure readings.  o Reviewed and update blood pressure medications.  o Coordinating refills sent to pharmacy for delivery with Dr. Sedalia Muta.  . Patient self care activities - Over the next 90 days, patient will: o Check BP daily, document, and provide at future appointments o Ensure daily salt intake < 2300 mg/day  Hyperlipidemia Lab Results  Component Value Date/Time   LDLCALC 120 (H) 11/04/2019 06:17 AM   LDLCALC 69 09/23/2018 02:12 PM   . Pharmacist Clinical Goal(s): o Over the next 90 days, patient will work with PharmD and providers to achieve LDL goal < 70 . Current regimen:  o Simvastatin 20 mg daily  o Aspirin 81 mg daily . Interventions: o Reviewed home medications.  o Corordinating medication sent to pharmacy for delivery.  . Patient self care activities - Over the next 90 days, patient will: o Continue to take simvastatin 20 mg daily as prescribed.  o Continue to avoid fried foods.   Constipation  . Pharmacist Clinical Goal(s): o Over the next 90 days, patient will work with PharmD and providers to achieve  improved bowel habits . Current regimen:  o Docusate 100 mg daily prn o Lactulase 20 gm/30 ml daily . Interventions: o Coordinating pharmacy delivery for lactulose. Patient was unable to get prescription when called in.  o Researched medication that worked well for patient in the hospital. Pharmacist discussing Linzess with Dr. Sedalia Muta.  . Patient self care activities - Over the next 90 days, patient will: o Consider beginning Linzess for constipation if approved by provider  Medication management . Pharmacist Clinical Goal(s): o Over the next 90 days, patient will work with PharmD and providers to achieve optimal medication adherence . [facility]  . Interventions o Comprehensive medication review performed. o Utilize [facility] for medication synchronization, packaging and delivery . Patient self care activities - Over the next 90 days, patient will: o Focus on medication adherence by using pill box o Take medications as prescribed o Report any questions or concerns to PharmD and/or provider(s)  Initial goal documentation        Ms. Kipnis was given information about Chronic Care Management services today including:  1. CCM service includes personalized support from designated clinical staff supervised by her physician, including individualized plan of care and coordination with other care providers 2. 24/7 contact phone numbers for assistance for urgent and routine care needs. 3. Standard insurance, coinsurance, copays and deductibles apply for chronic care management only during months in which we provide at least 20 minutes of these  services. Most insurances cover these services at 100%, however patients may be responsible for any copay, coinsurance and/or deductible if applicable. This service may help you avoid the need for more expensive face-to-face services. 4. Only one practitioner may furnish and bill the service in a calendar month. 5. The patient may stop  CCM services at any time (effective at the end of the month) by phone call to the office staff.  Patient agreed to services and verbal consent obtained.   The patient verbalized understanding of instructions provided today and agreed to receive a mailed copy of patient instruction and/or educational materials. Telephone follow up appointment with pharmacy team member scheduled for:12/2019  Juliane Lack, PharmD Clinical Pharmacist [name] [phone] (office) [phone] (mobile)  Sit-to-Stand Exercise  The sit-to-stand exercise (also known as the chair stand or chair rise exercise) strengthens your lower body and helps you maintain or improve your mobility and independence. The goal is to do the sit-to-stand exercise without using your hands. This will be easier as you become stronger. You should always talk with your health care provider before starting any exercise program, especially if you have had recent surgery. Do the exercise exactly as told by your health care provider and adjust it as directed. It is normal to feel mild stretching, pulling, tightness, or discomfort as you do this exercise, but you should stop right away if you feel sudden pain or your pain gets worse. Do not begin doing this exercise until told by your health care provider. What the sit-to-stand exercise does The sit-to-stand exercise helps to strengthen the muscles in your thighs and the muscles in the center of your body that give you stability (core muscles). This exercise is especially helpful if:  You have had knee or hip surgery.  You have trouble getting up from a chair, out of a car, or off the toilet. How to do the sit-to-stand exercise 1. Sit toward the front edge of a sturdy chair without armrests. Your knees should be bent and your feet should be flat on the floor and shoulder-width apart. 2. Place your hands lightly on each side of the seat. Keep your back and neck as straight as  possible, with your chest slightly forward. 3. Breathe in slowly. Lean forward and slightly shift your weight to the front of your feet. 4. Breathe out as you slowly stand up. Use your hands as little as possible. 5. Stand and pause for a full breath in and out. 6. Breathe in as you sit down slowly. Tighten your core and abdominal muscles to control your lowering as much as possible. 7. Breathe out slowly. 8. Do this exercise 10-15 times. If needed, do it fewer times until you build up strength. 9. Rest for 1 minute, then do another set of 10-15 repetitions. To change the difficulty of the sit-to-stand exercise  If the exercise is too difficult, use a chair with sturdy armrests, and push off the armrests to help you come to the standing position. You can also use the armrests to help slowly lower yourself back to sitting. As this gets easier, try to use your arms less. You can also place a firm cushion or pillow on the chair to make the surface higher.  If this exercise is too easy, do not use your arms to help raise or lower yourself. You can also wear a weighted vest, use hand weights, increase your repetitions, or try a lower chair. General tips  You may feel  tired when starting an exercise routine. This is normal.  You may have muscle soreness that lasts a few days. This is normal. As you get stronger, you may not feel muscle soreness.  Use smooth, steady movements.  Do not  hold your breath during strength exercises. This can cause unsafe changes in your blood pressure.  Breathe in slowly through your nose, and breathe out slowly through your mouth. Summary  Strengthening your lower body is an important step to help you move safely and independently.  The sit-to-stand exercise helps strengthen the muscles in your thighs and core.  You should always talk with your health care provider before starting any exercise program, especially if you have had recent surgery. This information  is not intended to replace advice given to you by your health care provider. Make sure you discuss any questions you have with your health care provider. Document Revised: 12/05/2017 Document Reviewed: 03/30/2016 Elsevier Patient Education  2020 [employer].

## 2019-11-26 ENCOUNTER — Other Ambulatory Visit: Payer: Self-pay

## 2019-11-26 MED ORDER — LACTULOSE 20 GM/30ML PO SOLN: 30.0000 mL | ORAL | 0 refills | Status: AC

## 2019-11-27 ENCOUNTER — Telehealth: Payer: Self-pay | Admitting: Cardiovascular Disease

## 2019-11-27 NOTE — Telephone Encounter (Signed)
Called and spoke  Leah Pittman.  Reviewing patient chart no documentation that Ranexa has been stopped by Dr Royann Shivers.    Leah Pittman verbalized understanding.

## 2019-11-27 NOTE — Telephone Encounter (Signed)
[facility] returned call.

## 2019-11-27 NOTE — Telephone Encounter (Signed)
Attempted to return call to Les Pou and [name]. Left message to call back.

## 2019-11-27 NOTE — Telephone Encounter (Signed)
New Message:   She needs to know if pt is still taking Ranexa?

## 2019-12-01 ENCOUNTER — Other Ambulatory Visit: Payer: Self-pay

## 2019-12-01 ENCOUNTER — Ambulatory Visit (INDEPENDENT_AMBULATORY_CARE_PROVIDER_SITE_OTHER): Payer: Medicare Other | Admitting: Family Medicine

## 2019-12-01 ENCOUNTER — Telehealth: Payer: Self-pay

## 2019-12-01 VITALS — BP 148/68 | HR 82 | Temp 96.9°F | Resp 16 | Ht 64.0 in | Wt 120.6 lb

## 2019-12-01 DIAGNOSIS — L509 Urticaria, unspecified: Secondary | ICD-10-CM | POA: Diagnosis not present

## 2019-12-01 MED ORDER — TRIAMCINOLONE ACETONIDE 40 MG/ML IJ SUSP
40.0000 mg | Freq: Once | INTRAMUSCULAR | Status: AC
  Administered 2019-12-01: 40 mg via INTRAMUSCULAR

## 2019-12-01 NOTE — Telephone Encounter (Signed)
Leah Pittman called to report that she was seen at [facility] over the weekend.  She had a rash on her face, trunk and arms.  They stopped the medication that Dr. Sedalia Muta started at her last visit and gave her lotrimin and cortisone cream.  She has seen very little improvement since her last visit.  She is questioning the need to come in for follow-up.

## 2019-12-01 NOTE — Progress Notes (Signed)
Acute Office Visit  Subjective:    Patient ID: Leah Pittman, female    DOB: 26-May-1930, 86 y.o.   MRN: [id number]  Chief Complaint  Patient presents with  . Rash    HPI Patient is in today for Rash began last Friday. She started zenpep 2 days prior to the rash beginning. It is very itchy.  The patient was seen at the urgent care on November 29, 2019.  They had discontinued his Zenpep.  They gave her triamcinolone cream (rest of body) and clotrimazole (front of her breasts.)  They recommended she take an antihistamine which I believe she is been taking Benadryl, which helps and they recommended her follow-up today with me.  Past Medical History:  Diagnosis Date  . Anxiety   . Arthritis   . Chest pain 2011   CARDIOLITE - no significant symptoms, EKG changes, or arrhythmias  . Dyspnea 02/16/2012   2D ECHO - EF 55-60%, normal  . Fatigue   . Glaucoma   . Headache(784.0)   . Hiatal hernia   . High blood pressure 02/16/2012   RENAL DOPPLER - normal  . Hyperlipidemia   . Hypothyroidism   . Labile hypertension   . Lightheadedness   . Migraine headache   . Multiple allergies   . Myalgia   . Pain, lower leg    Calf  . Problem of menstruation   . Reflux   . SOB (shortness of breath)   . Thyroid disease   . Urinary problem     Past Surgical History:  Procedure Laterality Date  . ABDOMINAL HYSTERECTOMY  1976  . CHOLECYSTECTOMY  2000  . CORONARY ARTERY BYPASS GRAFT N/A 07/10/2018   Procedure: CORONARY ARTERY BYPASS GRAFTING (CABG), FREE LIMA;  Surgeon: [name], MD;  Location: [facility] OR;  Service: Open Heart Surgery;  Laterality: N/A;  . CYSTECTOMY  1974   Intestine  . CYSTECTOMY  1996   Brain stem  . EYE SURGERY  2003  . LEFT HEART CATH AND CORONARY ANGIOGRAPHY N/A 07/08/2018   Procedure: LEFT HEART CATH AND CORONARY ANGIOGRAPHY;  Surgeon: Runell Gess, MD;  Location: [facility] INVASIVE CV LAB;  Service: Cardiovascular;  Laterality: N/A;  . LEFT HEART CATH AND CORONARY  ANGIOGRAPHY N/A 07/25/2019   Procedure: LEFT HEART CATH AND CORONARY ANGIOGRAPHY;  Surgeon: Kathleene Hazel, MD;  Location: [facility] INVASIVE CV LAB;  Service: Cardiovascular;  Laterality: N/A;  . RADIOLOGY WITH ANESTHESIA N/A 11/05/2019   Procedure: MRI WITH ANESTHESIA;  Surgeon: Radiologist, Medication, MD;  Location: [facility] OR;  Service: Radiology;  Laterality: N/A;  . TEE WITHOUT CARDIOVERSION N/A 07/10/2018   Procedure: TRANSESOPHAGEAL ECHOCARDIOGRAM (TEE);  Surgeon: [name], MD;  Location: [facility] OR;  Service: Open Heart Surgery;  Laterality: N/A;    Family History  Problem Relation Age of Onset  . Heart attack Father   . Stroke Father   . Cancer Sister   . Cancer Brother   . Asthma Brother   . Cancer Brother     Social History   Socioeconomic History  . Marital status: Married    Spouse name: Not on file  . Number of children: 3  . Years of education: college  . Highest education level: Not on file  Occupational History  . Occupation: Retired  Tobacco Use  . Smoking status: Never Smoker  . Smokeless tobacco: Never Used  Vaping Use  . Vaping Use: Never used  Substance and Sexual Activity  . Alcohol use: No  .  Drug use: No  . Sexual activity: Not on file  Other Topics Concern  . Not on file  Social History Narrative   [place] in [place]   Social Determinants of Health   Financial Resource Strain:   . Difficulty of Paying Living Expenses: Not on file  Food Insecurity: No Food Insecurity  . Worried About Programme researcher, broadcasting/film/videounning Out of Food in the Last Year: Never true  . Ran Out of Food in the Last Year: Never true  Transportation Needs:   . Lack of Transportation (Medical): Not on file  . Lack of Transportation (Non-Medical): Not on file  Physical Activity:   . Days of Exercise per Week: Not on file  . Minutes of Exercise per Session: Not on file  Stress:   . Feeling of Stress : Not on file  Social Connections:   . Frequency of Communication with Friends  and Family: Not on file  . Frequency of Social Gatherings with Friends and Family: Not on file  . Attends Religious Services: Not on file  . Active Member of Clubs or Organizations: Not on file  . Attends BankerClub or Organization Meetings: Not on file  . Marital Status: Not on file  Intimate Partner Violence:   . Fear of Current or Ex-Partner: Not on file  . Emotionally Abused: Not on file  . Physically Abused: Not on file  . Sexually Abused: Not on file    Outpatient Medications Prior to Visit  Medication Sig Dispense Refill  . acetaminophen (TYLENOL) 325 MG tablet Take 975 mg by mouth every 8 (eight) hours as needed for mild pain or headache.     Marland Kitchen. aspirin 81 MG tablet Take 81 mg by mouth daily.    . Calcium Citrate-Vitamin D (CALCIUM CITRATE+D3 PETITES PO) Take 1 tablet by mouth daily.    Marland Kitchen. docusate sodium (COLACE) 100 MG capsule Take 100 mg by mouth daily as needed for mild constipation.    . dorzolamide-timolol (COSOPT) 22.3-6.8 MG/ML ophthalmic solution Place 1 drop into the left eye 2 (two) times daily.     Marland Kitchen. ENULOSE 10 GM/15ML SOLN Take by mouth.    . Lactulose 20 GM/30ML SOLN Take 30 mLs (20 g total) by mouth 1 day or 1 dose for 30 doses. 900 mL 0  . latanoprost (XALATAN) 0.005 % ophthalmic solution Place 1 drop into both eyes at bedtime.     Marland Kitchen. levothyroxine (SYNTHROID) 100 MCG tablet Take 1 tablet (100 mcg total) by mouth daily before breakfast. 30 tablet 0  . LORazepam (ATIVAN) 1 MG tablet Take 0.5 tablets (0.5 mg total) by mouth 2 (two) times daily as needed for anxiety. (Patient not taking: Reported on 11/25/2019) 30 tablet 0  . losartan (COZAAR) 100 MG tablet Take 1 tablet (100 mg total) by mouth daily. (Patient taking differently: Take 25 mg by mouth daily. Patient reports taking 25 mg daily.) 30 tablet 1  . magnesium oxide (MAG-OX) 400 MG tablet Take 400 mg by mouth daily. Chewable    . nitroGLYCERIN (NITROSTAT) 0.4 MG SL tablet Place 0.4 mg under the tongue every 5 (five)  minutes as needed for chest pain.     Marland Kitchen. ondansetron (ZOFRAN) 8 MG tablet Take 1 tablet (8 mg total) by mouth 2 (two) times daily as needed for nausea or vomiting. 40 tablet 1  . Pancrelipase, Lip-Prot-Amyl, (ZENPEP) 20000-63000 units CPEP Before meals and snacks. 36 capsule 0  . pantoprazole (PROTONIX) 40 MG tablet Take 1 tablet (40 mg total)  by mouth daily. 90 tablet 0  . Probiotic Product (PROBIOTIC-10 PO) Take 1 tablet by mouth daily in the afternoon.    . ranolazine (RANEXA) 500 MG 12 hr tablet TAKE 1 TABLET BY MOUTH TWICE A DAY (Patient not taking: Reported on 11/25/2019) 180 tablet 1  . simvastatin (ZOCOR) 20 MG tablet TAKE 1 TABLET BY MOUTH EVERYDAY AT BEDTIME (Patient taking differently: Take 20 mg by mouth at bedtime. ) 90 tablet 0  . Vitamin D, Cholecalciferol, 50 MCG (2000 UT) CAPS Take 1 tablet by mouth daily.     No facility-administered medications prior to visit.    Allergies  Allergen Reactions  . Benadryl [Diphenhydramine] Swelling and Other (See Comments)    Tongue swells and hallucinates- could not talk  . Zenpep [Pancrelipase (Lip-Prot-Amyl)] Rash  . Codeine Nausea And Vomiting and Hypertension  . Meperidine Nausea Only, Swelling and Other (See Comments)    Tongue swells and "I feel like death"  . Prednisone     Hypertension   . Promethazine Other (See Comments)    Hypotension   . Propranolol Nausea And Vomiting  . Shellfish Allergy Nausea And Vomiting  . Tape Other (See Comments)    Tears the skin  . Tazarotene Other (See Comments)    Reaction not recalled   . Iodinated Diagnostic Agents Nausea Only and Rash    Review of Systems  Constitutional: Negative for chills, fatigue and fever.  HENT: Negative for congestion, ear pain and sore throat.   Respiratory: Negative for cough and shortness of breath.   Cardiovascular: Negative for chest pain.  Gastrointestinal: Positive for abdominal distention (Bloating.  Baseline.), abdominal pain (Mild, baseline) and  constipation. Negative for diarrhea, nausea and vomiting.  Genitourinary: Negative for dysuria and urgency.  Musculoskeletal: Negative for arthralgias and myalgias.  Skin: Positive for rash (Itchy).  Neurological: Negative for dizziness and headaches.  Psychiatric/Behavioral: Negative for dysphoric mood. The patient is not nervous/anxious.        Objective:    Physical Exam Constitutional:      Appearance: Normal appearance.  Cardiovascular:     Rate and Rhythm: Normal rate and regular rhythm.     Heart sounds: Normal heart sounds.  Pulmonary:     Effort: Pulmonary effort is normal.     Breath sounds: Normal breath sounds.  Skin:    Findings: Rash (Urticarial rash over face and arms abdomen and back.  Did not check her legs.  Patient has some mild facial swelling (per the patient this is improved.  Patient has intertrigo under her breasts bilaterally) present.  Neurological:     Mental Status: She is alert.     BP (!) 148/68   Pulse 82   Temp (!) 96.9 F (36.1 C)   Resp 16   Ht 5\' 4"  (1.626 m)   Wt 120 lb 9.6 oz (54.7 kg)   BMI 20.70 kg/m  Wt Readings from Last 3 Encounters:  12/01/19 120 lb 9.6 oz (54.7 kg)  11/10/19 119 lb (54 kg)  11/08/19 116 lb 6.4 oz (52.8 kg)     Health Maintenance Due  Topic Date Due  . COVID-19 Vaccine (1) Never done  . PNA vac Low Risk Adult (1 of 2 - PCV13) Never done  . INFLUENZA VACCINE  Never done    There are no preventive care reminders to display for this patient.   Lab Results  Component Value Date   TSH 6.815 (H) 11/03/2019   Lab Results  Component Value Date  WBC 7.5 11/07/2019   HGB 12.4 11/07/2019   HCT 37.5 11/07/2019   MCV 93.8 11/07/2019   PLT 225 11/07/2019   Lab Results  Component Value Date   NA 140 11/05/2019   K 3.8 11/05/2019   CO2 23 11/05/2019   GLUCOSE 94 11/05/2019   BUN 13 11/05/2019   CREATININE 0.81 11/05/2019   BILITOT 0.6 11/05/2019   ALKPHOS 45 11/05/2019   AST 18 11/05/2019   ALT 18  11/05/2019   PROT 5.7 (L) 11/05/2019   ALBUMIN 3.3 (L) 11/05/2019   CALCIUM 9.2 11/05/2019   ANIONGAP 8 11/05/2019   Lab Results  Component Value Date   CHOL 206 (H) 11/04/2019   Lab Results  Component Value Date   HDL 69 11/04/2019   Lab Results  Component Value Date   LDLCALC 120 (H) 11/04/2019   Lab Results  Component Value Date   TRIG 86 11/04/2019   Lab Results  Component Value Date   CHOLHDL 3.0 11/04/2019   Lab Results  Component Value Date   HGBA1C 5.8 (H) 07/10/2018       Assessment & Plan:  1. Urticaria Remain off Zenpep.  Unsure if it helped with her bowel abdominal issues as she did not take it long enough to find out.  May continue the 2 creams given by the urgent care.  Recommend either continue Benadryl or change to Zyrtec 10 mg 1-2 times per day Kenalog shot given. - triamcinolone acetonide (KENALOG-40) injection 40 mg    Meds ordered this encounter  Medications  . triamcinolone acetonide (KENALOG-40) injection 40 mg    No orders of the defined types were placed in this encounter.    Follow-up: Return if symptoms worsen or fail to improve.  An After Visit Summary was printed and given to the patient.  Blane Ohara [facility] [phone]

## 2019-12-01 NOTE — Telephone Encounter (Signed)
Appt set up kc

## 2019-12-11 ENCOUNTER — Telehealth: Payer: Self-pay

## 2019-12-11 NOTE — Chronic Care Management (AMB) (Signed)
Chronic Care Management Pharmacy Assistant   Name: Leah Pittman  MRN: [id number] DOB: 1930-07-21  Reason for Encounter: Medication Review/ Follow up  PCP : Blane Ohara, MD  12/11/2019- Called patient to follow up on starting with [facility] and to check if Lactulose has helped. Patient states the Lactulose has helped taking once a day, patient would like to hold on starting with [facility] for right now. She is still dealing with rash that is not going away, she has seen Dermatology, has been placed on steroids but nothing is working. Patient feels like it could be coming from her medication interacting with each other but she is not sure which ones. So until she narrows it down she will continue to get her medications from [facility]. Lucia Gaskins, CPP notified.   Allergies:   Allergies  Allergen Reactions  . Benadryl [Diphenhydramine] Swelling and Other (See Comments)    Tongue swells and hallucinates- could not talk  . Zenpep [Pancrelipase (Lip-Prot-Amyl)] Rash  . Codeine Nausea And Vomiting and Hypertension  . Meperidine Nausea Only, Swelling and Other (See Comments)    Tongue swells and "I feel like death"  . Prednisone     Hypertension   . Promethazine Other (See Comments)    Hypotension   . Propranolol Nausea And Vomiting  . Shellfish Allergy Nausea And Vomiting  . Tape Other (See Comments)    Tears the skin  . Tazarotene Other (See Comments)    Reaction not recalled   . Iodinated Diagnostic Agents Nausea Only and Rash    Medications: Outpatient Encounter Medications as of 12/11/2019  Medication Sig  . acetaminophen (TYLENOL) 325 MG tablet Take 975 mg by mouth every 8 (eight) hours as needed for mild pain or headache.   Marland Kitchen aspirin 81 MG tablet Take 81 mg by mouth daily.  . Calcium Citrate-Vitamin D (CALCIUM CITRATE+D3 PETITES PO) Take 1 tablet by mouth daily.  Marland Kitchen docusate sodium (COLACE) 100 MG capsule Take 100 mg by mouth daily as needed  for mild constipation.  . dorzolamide-timolol (COSOPT) 22.3-6.8 MG/ML ophthalmic solution Place 1 drop into the left eye 2 (two) times daily.   Marland Kitchen ENULOSE 10 GM/15ML SOLN Take by mouth.  . Lactulose 20 GM/30ML SOLN Take 30 mLs (20 g total) by mouth 1 day or 1 dose for 30 doses.  Marland Kitchen latanoprost (XALATAN) 0.005 % ophthalmic solution Place 1 drop into both eyes at bedtime.   Marland Kitchen levothyroxine (SYNTHROID) 100 MCG tablet Take 1 tablet (100 mcg total) by mouth daily before breakfast.  . LORazepam (ATIVAN) 1 MG tablet Take 0.5 tablets (0.5 mg total) by mouth 2 (two) times daily as needed for anxiety. (Patient not taking: Reported on 11/25/2019)  . losartan (COZAAR) 100 MG tablet Take 1 tablet (100 mg total) by mouth daily. (Patient taking differently: Take 25 mg by mouth daily. Patient reports taking 25 mg daily.)  . magnesium oxide (MAG-OX) 400 MG tablet Take 400 mg by mouth daily. Chewable  . nitroGLYCERIN (NITROSTAT) 0.4 MG SL tablet Place 0.4 mg under the tongue every 5 (five) minutes as needed for chest pain.   Marland Kitchen ondansetron (ZOFRAN) 8 MG tablet Take 1 tablet (8 mg total) by mouth 2 (two) times daily as needed for nausea or vomiting.  . Pancrelipase, Lip-Prot-Amyl, (ZENPEP) 20000-63000 units CPEP Before meals and snacks.  . pantoprazole (PROTONIX) 40 MG tablet Take 1 tablet (40 mg total) by mouth daily.  . Probiotic Product (PROBIOTIC-10 PO) Take 1 tablet  by mouth daily in the afternoon.  . ranolazine (RANEXA) 500 MG 12 hr tablet TAKE 1 TABLET BY MOUTH TWICE A DAY (Patient not taking: Reported on 11/25/2019)  . simvastatin (ZOCOR) 20 MG tablet TAKE 1 TABLET BY MOUTH EVERYDAY AT BEDTIME (Patient taking differently: Take 20 mg by mouth at bedtime. )  . Vitamin D, Cholecalciferol, 50 MCG (2000 UT) CAPS Take 1 tablet by mouth daily.   No facility-administered encounter medications on file as of 12/11/2019.    Current Diagnosis: Patient Active Problem List   Diagnosis Date Noted  . Hypothyroidism   .  Pancreatic duct dilated   . Descending thoracic aortic dissection (HCC)   . Elevated blood pressure reading   . Acute cystitis with hematuria 08/20/2019  . Hoarseness of voice 08/20/2019  . Medication management 08/20/2019  . SOB (shortness of breath) 08/20/2019  . Irritable bowel syndrome with constipation 08/20/2019  . Chronic obstructive pulmonary disease (HCC) 08/20/2019  . Abdominal pain, chronic, epigastric 08/20/2019  . Chest pain 07/24/2019  . Hypertensive crisis 07/24/2019  . Neck pain 07/24/2019  . S/P CABG x 4 07/10/2018  . Coronary artery disease involving native coronary artery of native heart without angina pectoris 07/08/2018  . Essential hypertension 08/14/2017  . Hypercholesterolemia 11/30/2015  . Precordial chest pain 01/13/2015  . Chest pain with high risk for cardiac etiology 12/12/2013  . Glaucoma 09/29/2012  . Anxiety   . Labile hypertension   . Migraine headache    Follow-Up:  Pharmacist Review - Patient would like to hold on onboarding with [facility] and noted that Lactulose is working.  Billee Cashing, CMA Clinical Pharmacist Assistant [phone]

## 2019-12-14 ENCOUNTER — Encounter: Payer: Self-pay | Admitting: Family Medicine

## 2019-12-22 ENCOUNTER — Other Ambulatory Visit: Payer: Self-pay

## 2019-12-22 ENCOUNTER — Ambulatory Visit (INDEPENDENT_AMBULATORY_CARE_PROVIDER_SITE_OTHER): Payer: Medicare Other | Admitting: Family Medicine

## 2019-12-22 VITALS — BP 124/56 | HR 60 | Temp 97.7°F | Resp 18 | Ht 64.0 in | Wt 118.8 lb

## 2019-12-22 DIAGNOSIS — I10 Essential (primary) hypertension: Secondary | ICD-10-CM | POA: Diagnosis not present

## 2019-12-22 DIAGNOSIS — R202 Paresthesia of skin: Secondary | ICD-10-CM | POA: Insufficient documentation

## 2019-12-22 DIAGNOSIS — G8929 Other chronic pain: Secondary | ICD-10-CM

## 2019-12-22 DIAGNOSIS — R1013 Epigastric pain: Secondary | ICD-10-CM

## 2019-12-22 DIAGNOSIS — T733XXD Exhaustion due to excessive exertion, subsequent encounter: Secondary | ICD-10-CM

## 2019-12-22 DIAGNOSIS — T148XXA Other injury of unspecified body region, initial encounter: Secondary | ICD-10-CM | POA: Insufficient documentation

## 2019-12-22 DIAGNOSIS — R233 Spontaneous ecchymoses: Secondary | ICD-10-CM

## 2019-12-22 DIAGNOSIS — M791 Myalgia, unspecified site: Secondary | ICD-10-CM

## 2019-12-22 DIAGNOSIS — R2681 Unsteadiness on feet: Secondary | ICD-10-CM

## 2019-12-22 HISTORY — DX: Unsteadiness on feet: R26.81

## 2019-12-22 HISTORY — DX: Paresthesia of skin: R20.2

## 2019-12-22 NOTE — Progress Notes (Signed)
Subjective:  Patient ID: Leah Pittman, female    DOB: 05/18/1930  Age: 86 y.o. MRN: [id number]  Chief Complaint  Patient presents with  . Abdominal Pain    HPI Patient had epigastric tenderness: CT showed aortic atherosclerosis and atrophic pancreatitis. MRCP done on 11/05/2019 showed a dilated duct. Recommended repeat in 12 months.   Developed rash after taking zenpep. She only took for 4 days before rash occurred. Rash nearly gone, but has occasionally scale.   Patient feels weak. Feels like she is off balance and abnormal gait. She is walking 1/2 mile per day, but she become exhausted.   Current Outpatient Medications on File Prior to Visit  Medication Sig Dispense Refill  . acetaminophen (TYLENOL) 325 MG tablet Take 975 mg by mouth every 8 (eight) hours as needed for mild pain or headache.     Marland Kitchen aspirin 81 MG tablet Take 81 mg by mouth daily.    . Calcium Citrate-Vitamin D (CALCIUM CITRATE+D3 PETITES PO) Take 1 tablet by mouth daily.    Marland Kitchen docusate sodium (COLACE) 100 MG capsule Take 100 mg by mouth daily as needed for mild constipation.    . dorzolamide-timolol (COSOPT) 22.3-6.8 MG/ML ophthalmic solution Place 1 drop into the left eye 2 (two) times daily.     Marland Kitchen ENULOSE 10 GM/15ML SOLN Take by mouth.    . latanoprost (XALATAN) 0.005 % ophthalmic solution Place 1 drop into both eyes at bedtime.     Marland Kitchen levothyroxine (SYNTHROID) 100 MCG tablet Take 1 tablet (100 mcg total) by mouth daily before breakfast. 30 tablet 0  . losartan (COZAAR) 100 MG tablet Take 1 tablet (100 mg total) by mouth daily. (Patient taking differently: Take 25 mg by mouth daily. Patient reports taking 25 mg daily.) 30 tablet 1  . magnesium oxide (MAG-OX) 400 MG tablet Take 400 mg by mouth daily. Chewable    . nitroGLYCERIN (NITROSTAT) 0.4 MG SL tablet Place 0.4 mg under the tongue every 5 (five) minutes as needed for chest pain.     Marland Kitchen ondansetron (ZOFRAN) 8 MG tablet Take 1 tablet (8 mg total) by mouth 2 (two)  times daily as needed for nausea or vomiting. 40 tablet 1  . pantoprazole (PROTONIX) 40 MG tablet Take 1 tablet (40 mg total) by mouth daily. 90 tablet 0  . Probiotic Product (PROBIOTIC-10 PO) Take 1 tablet by mouth daily in the afternoon.    . ranolazine (RANEXA) 500 MG 12 hr tablet TAKE 1 TABLET BY MOUTH TWICE A DAY (Patient not taking: Reported on 11/25/2019) 180 tablet 1  . triamcinolone cream (KENALOG) 0.1 % SMARTSIG:1 Application Topical 2-3 Times Daily    . Vitamin D, Cholecalciferol, 50 MCG (2000 UT) CAPS Take 1 tablet by mouth daily.     No current facility-administered medications on file prior to visit.   Past Medical History:  Diagnosis Date  . Anxiety   . Arthritis   . Chest pain 2011   CARDIOLITE - no significant symptoms, EKG changes, or arrhythmias  . Congenital prolapse of bladder mucosa   . Diverticulosis   . Dyspnea 02/16/2012   2D ECHO - EF 55-60%, normal  . Esophageal dysphagia   . Fatigue   . GERD (gastroesophageal reflux disease)   . Glaucoma   . Headache(784.0)   . Heart attack (HCC)   . Hiatal hernia   . High blood pressure 02/16/2012   RENAL DOPPLER - normal  . Hyperlipidemia   . Hypothyroidism   . Labile hypertension   .  Lightheadedness   . Migraine headache   . Multiple allergies   . Myalgia   . OSA (obstructive sleep apnea)   . Pain, lower leg    Calf  . Problem of menstruation   . Proteinuria   . Reflux   . SOB (shortness of breath)   . Thyroid disease   . Urinary problem   . Valvular heart disease   . Vitamin B12 deficiency   . Vitamin D deficiency    Past Surgical History:  Procedure Laterality Date  . ABDOMINAL HYSTERECTOMY  1976  . CHOLECYSTECTOMY  2000  . COLONOSCOPY  07/27/2014   Moderate predominantly sigmoid divertuculosis. Otherwise normal colonoscopy  . CORONARY ARTERY BYPASS GRAFT N/A 07/10/2018   Procedure: CORONARY ARTERY BYPASS GRAFTING (CABG), FREE LIMA;  Surgeon: [name], MD;  Location: [facility] OR;  Service: Open  Heart Surgery;  Laterality: N/A;  . CYST REMOVAL NECK    . CYSTECTOMY  1974   Intestine  . CYSTECTOMY  1996   Brain stem  . ESOPHAGOGASTRODUODENOSCOPY  12/07/2016   Schatzki's ring status post esophageal dilatation. Small hiatal hernia. Mild gastritis  . EYE SURGERY  2003  . EYE SURGERY  07/2016  . LEFT HEART CATH AND CORONARY ANGIOGRAPHY N/A 07/08/2018   Procedure: LEFT HEART CATH AND CORONARY ANGIOGRAPHY;  Surgeon: Runell Gess, MD;  Location: [facility] INVASIVE [facility];  Service: Cardiovascular;  Laterality: N/A;  . LEFT HEART CATH AND CORONARY ANGIOGRAPHY N/A 07/25/2019   Procedure: LEFT HEART CATH AND CORONARY ANGIOGRAPHY;  Surgeon: Kathleene Hazel, MD;  Location: [facility]AS[facility];  Service: Cardiovascular;  Laterality: N/A;  . MOUTH SURGERY  2013  . RADIOLOGY WITH ANESTHESIA N/A 11/05/2019   Procedure: MRI WITH ANESTHESIA;  Surgeon: Radiologist, Medication, MD;  Location: [facility] OR;  Service: Radiology;  Laterality: N/A;  . TEE WITHOUT CARDIOVERSION N/A 07/10/2018   Procedure: TRANSESOPHAGEAL ECHOCARDIOGRAM (TEE);  Surgeon: [name], MD;  Location[facility];  Service: Open Heart Surgery;  Laterality: N/A;  . TONSILLECTOMY     age 39    Family History  Problem Relation Age of Onset  . Heart attack Father   . Stroke Father   . Cancer Sister   . Cancer Brother   . Asthma Brother   . Cancer Brother    Social History   Socioeconomic History  . Marital status: Married    Spouse name: Not on file  . Number of children: 3  . Years of education: college  . Highest education level: Not on file  Occupational History  . Occupation: Retired  Tobacco Use  . Smoking status: Never Smoker  . Smokeless tobacco: Never Used  Vaping Use  . Vaping Use: Never used  Substance and Sexual Activity  . Alcohol use: No  . Drug use: No  . Sexual activity: Not on file  Other Topics Concern  . Not on file  Social History Narrative   Lives with husband in [place], [place]   Social  Determinants of Health   Financial Resource Strain:   . Difficulty of Paying Living Expenses: Not on file  Food Insecurity: No Food Insecurity  . Worried About Programme researcher, broadcasting/film/video in the Last Year: Never true  . Ran Out of Food in the Last Year: Never true  Transportation Needs:   . Lack of Transportation (Medical): Not on file  . Lack of Transportation (Non-Medical): Not on file  Physical Activity:   . Days of Exercise per Week: Not  on file  . Minutes of Exercise per Session: Not on file  Stress:   . Feeling of Stress : Not on file  Social Connections:   . Frequency of Communication with Friends and Family: Not on file  . Frequency of Social Gatherings with Friends and Family: Not on file  . Attends Religious Services: Not on file  . Active Member of Clubs or Organizations: Not on file  . Attends Banker Meetings: Not on file  . Marital Status: Not on file    Review of Systems  Constitutional: Positive for chills and fatigue. Negative for fever.  HENT: Positive for ear pain (intermittent pain into temples/ears. ). Negative for congestion and sore throat.   Respiratory: Positive for shortness of breath (with ambulation). Negative for cough.   Cardiovascular: Negative for chest pain.  Gastrointestinal: Positive for abdominal pain (epigastric.) and constipation (taking lactulose. helps. ). Negative for diarrhea, nausea and vomiting.  Genitourinary: Negative for dysuria and urgency.  Musculoskeletal: Positive for myalgias (cramping a lot at night. ). Negative for arthralgias.  Skin: Positive for rash (see hpi.).  Neurological: Positive for dizziness. Negative for headaches.  Psychiatric/Behavioral: Negative for dysphoric mood. The patient is not nervous/anxious.      Objective:  BP (!) 124/56   Pulse 60   Temp 97.7 F (36.5 C)   Resp 18   Ht 5\' 4"  (1.626 m)   Wt 118 lb 12.8 oz (53.9 kg)   BMI 20.39 kg/m   BP/Weight 12/22/2019 12/01/2019 11/10/2019  Systolic  BP 124 11/12/2019 130  Diastolic BP 56 68 56  Wt. (Lbs) 118.8 120.6 119  BMI 20.39 20.7 20.43    Physical Exam Vitals reviewed.  Constitutional:      Appearance: Normal appearance. She is normal weight.  Neck:     Vascular: No carotid bruit.  Cardiovascular:     Rate and Rhythm: Normal rate and regular rhythm.     Pulses: Normal pulses.     Heart sounds: Normal heart sounds.  Pulmonary:     Effort: Pulmonary effort is normal. No respiratory distress.     Breath sounds: Normal breath sounds.  Abdominal:     General: Abdomen is flat. Bowel sounds are normal.     Palpations: Abdomen is soft.     Tenderness: There is generalized abdominal tenderness.  Musculoskeletal:        General: Swelling (balls of feet. numbness in lower extremities.) present.  Neurological:     Mental Status: She is alert and oriented to person, place, and time.  Psychiatric:        Mood and Affect: Mood normal.        Behavior: Behavior normal.      Lab Results  Component Value Date   WBC 7.9 12/22/2019   HGB 12.4 12/22/2019   HCT 36.6 12/22/2019   PLT 231 12/22/2019   GLUCOSE 92 12/22/2019   CHOL 206 (H) 11/04/2019   TRIG 86 11/04/2019   HDL 69 11/04/2019   LDLCALC 120 (H) 11/04/2019   ALT 14 12/22/2019   AST 10 12/22/2019   NA 140 12/22/2019   K 4.8 12/22/2019   CL 104 12/22/2019   CREATININE 0.94 12/22/2019   BUN 18 12/22/2019   CO2 25 12/22/2019   TSH 0.879 12/22/2019   INR 0.9 12/22/2019   HGBA1C 5.8 (H) 07/10/2018      Assessment & Plan:  1. Essential hypertension The current medical regimen is effective;  continue present plan and medications.  2. Abdominal pain, chronic, epigastric Unclear etiology. Continues to have constipation issues, but also may be chronic pancreatitis. Recommend retry zenpep.  I do not believe she had an allergic reaction to this medication based on the history.    3. Unstable gait  4. Paresthesia of both feet - B12 and Folate Panel - Methylmalonic  acid, serum - TSH  5. Spontaneous bruising - Protime-INR  6. Fatigue due to excessive exertion, subsequent encounter - CBC with Differential/Platelet - Comprehensive metabolic panel - Sedimentation rate  7. Myalgia - Phosphorus - Magnesium - CK  Follow-up: Return in about 6 weeks (around 02/02/2020) for fasting visit.  An After Visit Summary was printed and given to the patient.  Blane OharaKirsten Shonia Skilling [facility] [phone]

## 2019-12-23 ENCOUNTER — Telehealth: Payer: Self-pay

## 2019-12-23 LAB — PROTIME-INR
INR: 0.9 (ref 0.9–1.2)
Prothrombin Time: 9.8 s (ref 9.1–12.0)

## 2019-12-23 NOTE — Chronic Care Management (AMB) (Signed)
Chronic Care Management Pharmacy Assistant   Name: Leah Pittman  MRN: [id number] DOB: Jul 20, 1930  Reason for Encounter: Medication Review/ Monthly Dispensing Call.  Patient Questions:  1.  Have you seen any other providers since your last visit? Yes, 12/01/2019- Dr [name] (PCP), 12/04/2019- Dr [name] (Opthalmology), 11/22/2019= Dr. Sedalia Muta (PCP).  2.  Any changes in your medicines or health? Yes, Lorazepam 0.5 mg, Pancrelipase 20000-63000 units and Simvastatin 20 mg discontinued on 12/01/19.  PCP : Blane Ohara, MD  Allergies:   Allergies  Allergen Reactions   Benadryl [Diphenhydramine] Swelling and Other (See Comments)    Tongue swells and hallucinates- could not talk   Zenpep [Pancrelipase (Lip-Prot-Amyl)] Rash   Codeine Nausea And Vomiting and Hypertension   Meperidine Nausea Only, Swelling and Other (See Comments)    Tongue swells and "I feel like death"   Prednisone     Hypertension    Promethazine Other (See Comments)    Hypotension    Propranolol Nausea And Vomiting   Shellfish Allergy Nausea And Vomiting   Tape Other (See Comments)    Tears the skin   Tazarotene Other (See Comments)    Reaction not recalled    Iodinated Diagnostic Agents Nausea Only and Rash    Medications: Outpatient Encounter Medications as of 12/23/2019  Medication Sig   acetaminophen (TYLENOL) 325 MG tablet Take 975 mg by mouth every 8 (eight) hours as needed for mild pain or headache.    aspirin 81 MG tablet Take 81 mg by mouth daily.   Calcium Citrate-Vitamin D (CALCIUM CITRATE+D3 PETITES PO) Take 1 tablet by mouth daily.   docusate sodium (COLACE) 100 MG capsule Take 100 mg by mouth daily as needed for mild constipation.   dorzolamide-timolol (COSOPT) 22.3-6.8 MG/ML ophthalmic solution Place 1 drop into the left eye 2 (two) times daily.    ENULOSE 10 GM/15ML SOLN Take by mouth.   Lactulose 20 GM/30ML SOLN Take 30 mLs (20 g total) by mouth 1 day or 1 dose for 30 doses.    latanoprost (XALATAN) 0.005 % ophthalmic solution Place 1 drop into both eyes at bedtime.    levothyroxine (SYNTHROID) 100 MCG tablet Take 1 tablet (100 mcg total) by mouth daily before breakfast.   losartan (COZAAR) 100 MG tablet Take 1 tablet (100 mg total) by mouth daily. (Patient taking differently: Take 25 mg by mouth daily. Patient reports taking 25 mg daily.)   magnesium oxide (MAG-OX) 400 MG tablet Take 400 mg by mouth daily. Chewable   nitroGLYCERIN (NITROSTAT) 0.4 MG SL tablet Place 0.4 mg under the tongue every 5 (five) minutes as needed for chest pain.    ondansetron (ZOFRAN) 8 MG tablet Take 1 tablet (8 mg total) by mouth 2 (two) times daily as needed for nausea or vomiting.   pantoprazole (PROTONIX) 40 MG tablet Take 1 tablet (40 mg total) by mouth daily.   Probiotic Product (PROBIOTIC-10 PO) Take 1 tablet by mouth daily in the afternoon.   ranolazine (RANEXA) 500 MG 12 hr tablet TAKE 1 TABLET BY MOUTH TWICE A DAY (Patient not taking: Reported on 11/25/2019)   triamcinolone cream (KENALOG) 0.1 % SMARTSIG:1 Application Topical 2-3 Times Daily   Vitamin D, Cholecalciferol, 50 MCG (2000 UT) CAPS Take 1 tablet by mouth daily.   No facility-administered encounter medications on file as of 12/23/2019.    Current Diagnosis: Patient Active Problem List   Diagnosis Date Noted   Unstable gait 12/22/2019   Paresthesia of both feet 12/22/2019  Bruising 12/22/2019   Hypothyroidism    Pancreatic duct dilated    Descending thoracic aortic dissection (HCC)    Elevated blood pressure reading    Acute cystitis with hematuria 08/20/2019   Hoarseness of voice 08/20/2019   Medication management 08/20/2019   SOB (shortness of breath) 08/20/2019   Irritable bowel syndrome with constipation 08/20/2019   Chronic obstructive pulmonary disease (HCC) 08/20/2019   Abdominal pain, chronic, epigastric 08/20/2019   Chest pain 07/24/2019   Hypertensive crisis 07/24/2019    Neck pain 07/24/2019   S/P CABG x 4 07/10/2018   Coronary artery disease involving native coronary artery of native heart without angina pectoris 07/08/2018   Essential hypertension 08/14/2017   Hypercholesterolemia 11/30/2015   Precordial chest pain 01/13/2015   Chest pain with high risk for cardiac etiology 12/12/2013   Glaucoma 09/29/2012   Anxiety    Labile hypertension    Migraine headache    Reviewed chart for medication changes ahead of medication coordination call.   OVs- 12/01/2019- Dr Sedalia Muta (PCP), 12/04/2019- Dr [name] (Opthalmology), 11/22/2019= Dr. Sedalia Muta (PCP) since last care coordination call/Pharmacist visit.  Medication changes indicated: Lorazepam 0.5 mg, Pancrelipase 20000-63000 units and Simvastatin 20 mg discontinued on 12/01/19.  BP Readings from Last 3 Encounters:  12/22/19 (!) 124/56  12/01/19 (!) 148/68  11/10/19 (!) 130/56    Lab Results  Component Value Date   HGBA1C 5.8 (H) 07/10/2018     Patient obtains medications through Vials  30 Days   Last adherence delivery included: Enulose Solution 10 gm/15 ml- take 30 ml once daily for 30 days  Patient declined any further medications needed from [employer].  Called patient to review medication and check to see if she was ready to onboard fully with [facility]. Spoke with daughter Charlton Amor, she is not sure what patient wants to do or needs, she will touch base with patient and call back.  Follow-Up:  Occupational hygienist and Pharmacist Review  Lucia Gaskins, CPP notified. No Fill Needed report sent to [facility] and to hold profile for now.  Billee Cashing, CMA Clinical Pharmacist Assistant [phone]

## 2019-12-25 ENCOUNTER — Telehealth: Payer: Self-pay

## 2019-12-25 LAB — COMPREHENSIVE METABOLIC PANEL
ALT: 14 IU/L (ref 0–32)
AST: 10 IU/L (ref 0–40)
Albumin/Globulin Ratio: 1.7 (ref 1.2–2.2)
Albumin: 4.3 g/dL (ref 3.6–4.6)
Alkaline Phosphatase: 74 IU/L (ref 44–121)
BUN/Creatinine Ratio: 19 (ref 12–28)
BUN: 18 mg/dL (ref 8–27)
Bilirubin Total: 0.3 mg/dL (ref 0.0–1.2)
CO2: 25 mmol/L (ref 20–29)
Calcium: 10.1 mg/dL (ref 8.7–10.3)
Chloride: 104 mmol/L (ref 96–106)
Creatinine, Ser: 0.94 mg/dL (ref 0.57–1.00)
GFR calc Af Amer: 64 mL/min/{1.73_m2} (ref >59)
GFR calc non Af Amer: 55 mL/min/{1.73_m2} — ABNORMAL LOW (ref >59)
Globulin, Total: 2.5 g/dL (ref 1.5–4.5)
Glucose: 92 mg/dL (ref 65–99)
Potassium: 4.8 mmol/L (ref 3.5–5.2)
Sodium: 140 mmol/L (ref 134–144)
Total Protein: 6.8 g/dL (ref 6.0–8.5)

## 2019-12-25 LAB — CBC WITH DIFFERENTIAL/PLATELET
Basophils Absolute: 0.1 10*3/uL (ref 0.0–0.2)
Basos: 1 %
EOS (ABSOLUTE): 0.2 10*3/uL (ref 0.0–0.4)
Eos: 3 %
Hematocrit: 36.6 % (ref 34.0–46.6)
Hemoglobin: 12.4 g/dL (ref 11.1–15.9)
Immature Grans (Abs): 0 10*3/uL (ref 0.0–0.1)
Immature Granulocytes: 0 %
Lymphocytes Absolute: 1.9 10*3/uL (ref 0.7–3.1)
Lymphs: 24 %
MCH: 31.8 pg (ref 26.6–33.0)
MCHC: 33.9 g/dL (ref 31.5–35.7)
MCV: 94 fL (ref 79–97)
Monocytes Absolute: 1 10*3/uL — ABNORMAL HIGH (ref 0.1–0.9)
Monocytes: 12 %
Neutrophils Absolute: 4.8 10*3/uL (ref 1.4–7.0)
Neutrophils: 60 %
Platelets: 231 10*3/uL (ref 150–450)
RBC: 3.9 x10E6/uL (ref 3.77–5.28)
RDW: 14.1 % (ref 11.7–15.4)
WBC: 7.9 10*3/uL (ref 3.4–10.8)

## 2019-12-25 LAB — PHOSPHORUS: Phosphorus: 3.5 mg/dL (ref 3.0–4.3)

## 2019-12-25 LAB — SEDIMENTATION RATE: Sed Rate: 5 mm/hr (ref 0–40)

## 2019-12-25 LAB — METHYLMALONIC ACID, SERUM: Methylmalonic Acid: 218 nmol/L (ref 0–378)

## 2019-12-25 LAB — B12 AND FOLATE PANEL
Folate: 9 ng/mL (ref >3.0)
Vitamin B-12: 387 pg/mL (ref 232–1245)

## 2019-12-25 LAB — TSH: TSH: 0.879 u[IU]/mL (ref 0.450–4.500)

## 2019-12-25 LAB — MAGNESIUM: Magnesium: 2.2 mg/dL (ref 1.6–2.3)

## 2019-12-25 LAB — CK: Total CK: 44 U/L (ref 26–161)

## 2019-12-25 NOTE — Telephone Encounter (Signed)
I do not know why the pt is sob. We can send her for a stat cxr at the hospital on Friday am.  Ask if any chest pain. If so have her get stat troponin and probnp.  I would recommend lyrica 25 mg one tid for leg pain. Kc

## 2019-12-25 NOTE — Telephone Encounter (Signed)
Leah Pittman was notified of her lab results and she is obviously short of breath.  She denies swelling of her legs or feet but she does have abdominal swelling and bloating.  Her last bowel movement was today and she denies constipation .  She is experiencing some cough but essentially non-productive.  She continues to experience burning of her feet and leg cramps.  The swelling in her legs and feet have completely resolved.  She is more concerned with her shortness of breath.  She denies chest pain .

## 2019-12-26 ENCOUNTER — Other Ambulatory Visit: Payer: Self-pay | Admitting: Family Medicine

## 2019-12-26 ENCOUNTER — Encounter: Payer: Self-pay | Admitting: Family Medicine

## 2019-12-26 ENCOUNTER — Telehealth: Payer: Self-pay | Admitting: Cardiovascular Disease

## 2019-12-26 NOTE — Telephone Encounter (Signed)
Pt c/o of Chest Pain: STAT if CP now or developed within 24 hours  1. Are you having CP right now? no  2. Are you experiencing any other symptoms (ex. SOB, nausea, vomiting, sweating)? Cough and SOB   3. How long have you been experiencing CP? A few weeks  4. Is your CP continuous or coming and going? Comes and goes. Mostly occurs when she lays down  5. Have you taken Nitroglycerin? no ?  Pt called [place] because she called her PCP this morning.  She has a cold and has been coughing a lot. She told her PCP that she was having some chest pain that has been going on for a few weeks now as well, but it only would occur when she would lay down. Her PCP advised her to let Dr. Salena Saner know what was going on and see if there was anything Dr. Salena Saner would want her to do.   Pt is going to [facility] to have some labs and an X-Ray done. Pt said her answering machine is not working so if the office calls and can not reach her this morning to try back this afternoon.

## 2019-12-26 NOTE — Telephone Encounter (Signed)
Gerardo continues to have episodes of shortness of breath and she has had some chest pain.  She agreed to go to the hospital today for labs and chest xray.  Her husband was up during the night with diarrhea and he is still sleeping.  She may not be able to go until lunch time.

## 2019-12-26 NOTE — Telephone Encounter (Signed)
Dr. Sedalia Muta made aware of patient's chest pain.  She recommended that she call cardiology and make them aware of her symptoms.  If they prefer she gets the chest xray and blood work then we can send the order.

## 2019-12-28 ENCOUNTER — Encounter: Payer: Self-pay | Admitting: Family Medicine

## 2019-12-29 ENCOUNTER — Telehealth: Payer: Medicare Other

## 2019-12-31 ENCOUNTER — Ambulatory Visit: Payer: Medicare Other

## 2019-12-31 ENCOUNTER — Other Ambulatory Visit: Payer: Self-pay

## 2019-12-31 DIAGNOSIS — K5900 Constipation, unspecified: Secondary | ICD-10-CM

## 2019-12-31 DIAGNOSIS — E782 Mixed hyperlipidemia: Secondary | ICD-10-CM

## 2019-12-31 DIAGNOSIS — I10 Essential (primary) hypertension: Secondary | ICD-10-CM

## 2019-12-31 NOTE — Patient Instructions (Addendum)
Visit Information  Goals Addressed              This Visit's Progress   .  Pharmacy Care Plan (pt-stated)        CARE PLAN ENTRY (see longitudinal plan of care for additional care plan information)  Current Barriers:  . Chronic Disease Management support, education, and care coordination needs related to Constipation, Hypertension and Hyperlipidemia   Hypertension BP Readings from Last 3 Encounters:  11/10/19 (!) 130/56  11/08/19 (!) 116/41  08/20/19 136/70   . Pharmacist Clinical Goal(s): o Over the next 90 days, patient will work with PharmD and providers to maintain BP goal <130/80 . Current regimen:  o Losartan 25 mg daily  . Interventions: o Reviewed home blood pressure readings.  o Reviewed and update blood pressure medications.  . Patient self care activities - Over the next 90 days, patient will: o Check BP daily, document, and provide at future appointments o Ensure daily salt intake < 2300 mg/day  Hyperlipidemia Lab Results  Component Value Date/Time   LDLCALC 120 (H) 11/04/2019 06:17 AM   LDLCALC 69 09/23/2018 02:12 PM   . Pharmacist Clinical Goal(s): o Over the next 90 days, patient will work with PharmD and providers to achieve LDL goal < 70 . Current regimen:  o Simvastatin 20 mg daily  o Aspirin 81 mg daily . Interventions: o Reviewed home medications.  o Patient declined pharmacy delivery/packaging at this time.  . Patient self care activities - Over the next 90 days, patient will: o Continue to take simvastatin 20 mg daily as prescribed.  o Continue to avoid fried foods.   Constipation  . Pharmacist Clinical Goal(s): o Over the next 90 days, patient will work with PharmD and providers to achieve improved bowel habits . Current regimen:  o Docusate 100 mg daily prn o Lactulase 20 gm/30 ml daily prn . Interventions: o Patient reports lactulose worked well for her but she isn't able to take daily due to stool too loose.  . Patient self care  activities - Over the next 90 days, patient will: o Consider beginning Linzess for constipation if approved by provider  Medication management . Pharmacist Clinical Goal(s): o Over the next 90 days, patient will work with PharmD and providers to achieve optimal medication adherence . Current pharmacy: [facility]  . Interventions o Comprehensive medication review performed. o Continue current medication management strategy . Patient self care activities - Over the next 90 days, patient will: o Focus on medication adherence by using pill box o Take medications as prescribed o Report any questions or concerns to PharmD and/or provider(s)  Please see past updates related to this goal by clicking on the "Past Updates" button in the selected goal         Patient verbalizes understanding of instructions provided today.   Telephone follow up appointment with pharmacy team member scheduled for:  Juliane Lack, PharmD, [facility] Clinical Pharmacist [name] [phone] (office) [phone] (mobile)   Constipation, Adult Constipation is when a person:  Poops (has a bowel movement) fewer times in a week than normal.  Has a hard time pooping.  Has poop that is dry, hard, or bigger than normal. Follow these instructions at home: Eating and drinking   Eat foods that have a lot of fiber, such as: ? Fresh fruits and vegetables. ? Whole grains. ? Beans.  Eat less of foods that are high in fat, low in fiber, or overly processed, such as: ?  [place] fries. ? Hamburgers. ? Cookies. ? Candy. ? Soda.  Drink enough fluid to keep your pee (urine) clear or pale yellow. General instructions  Exercise regularly or as told by your doctor.  Go to the restroom when you feel like you need to poop. Do not hold it in.  Take over-the-counter and prescription medicines only as told by your doctor. These include any fiber supplements.  Do pelvic floor retraining exercises, such  as: ? Doing deep breathing while relaxing your lower belly (abdomen). ? Relaxing your pelvic floor while pooping.  Watch your condition for any changes.  Keep all follow-up visits as told by your doctor. This is important. Contact a doctor if:  You have pain that gets worse.  You have a fever.  You have not pooped for 4 days.  You throw up (vomit).  You are not hungry.  You lose weight.  You are bleeding from the anus.  You have thin, pencil-like poop (stool). Get help right away if:  You have a fever, and your symptoms suddenly get worse.  You leak poop or have blood in your poop.  Your belly feels hard or bigger than normal (is bloated).  You have very bad belly pain.  You feel dizzy or you faint. This information is not intended to replace advice given to you by your health care provider. Make sure you discuss any questions you have with your health care provider. Document Revised: 01/19/2017 Document Reviewed: 07/28/2015 Elsevier Patient Education  2020 [employer].

## 2019-12-31 NOTE — Chronic Care Management (AMB) (Signed)
Chronic Care Management Pharmacy  Name: Leah Pittman  MRN: [id number] DOB: December 02, 1930  Chief Complaint/ HPI  Leah Pittman,  86 y.o. , female presents for their follow-up CCM visit with the clinical pharmacist via telephone due to COVID-19 Pandemic.  PCP : Leah Brome, MD  Their chronic conditions include: hypertension, migraine headache, CAD, NSTEMI, COPD, anxiety, glucaoma, constipation, epigastric pain.   Office Visits: 12/22/2019 - retry zenpep.  Consult Visit: Dermatology for rash per patient.   Medications: Outpatient Encounter Medications as of 09/29/2019  Medication Sig  . acetaminophen (TYLENOL) 325 MG tablet Take 975 mg by mouth every 8 (eight) hours as needed for mild pain or headache.   Marland Kitchen aspirin 81 MG tablet Take 81 mg by mouth daily.  . Calcium Citrate-Vitamin D (CALCIUM CITRATE+D3 PETITES PO) Take 1 tablet by mouth daily.  [name] Sodium (STOOL SOFTENER LAXATIVE PO) Take by mouth.  . dorzolamide-timolol (COSOPT) 22.3-6.8 MG/ML ophthalmic solution Place 1 drop into the left eye 2 (two) times daily.   Marland Kitchen HYDROcodone-acetaminophen (NORCO/VICODIN) 5-325 MG tablet Take 1 tablet by mouth every 6 (six) hours as needed for moderate pain.  Marland Kitchen latanoprost (XALATAN) 0.005 % ophthalmic solution Place 1 drop into the left eye at bedtime.  Marland Kitchen levothyroxine (SYNTHROID) 100 MCG tablet TAKE 1 TABLET BY MOUTH EVERY DAY (Patient taking differently: Take 100 mcg by mouth daily. )  . LORazepam (ATIVAN) 1 MG tablet TAKE 1 TABLET(1 MG) BY MOUTH TWICE DAILY AS NEEDED FOR ANXIETY  . losartan (COZAAR) 25 MG tablet TAKE 1 TABLET(25 MG) BY MOUTH 1 TIME PER DAY (Patient taking differently: Take 25 mg by mouth daily. )  . magnesium oxide (MAG-OX) 400 MG tablet Take 400 mg by mouth daily.  . nitroGLYCERIN (NITROSTAT) 0.4 MG SL tablet Place 0.4 mg under the tongue every 5 (five) minutes as needed for chest pain.   Marland Kitchen ondansetron (ZOFRAN) 8 MG tablet Take 1 tablet (8 mg total) by mouth 2 (two)  times daily as needed for nausea or vomiting.  . pantoprazole (PROTONIX) 40 MG tablet Take 1 tablet (40 mg total) by mouth daily.  . Probiotic Product (PROBIOTIC DAILY PO) Take 1 capsule by mouth daily.  . ranolazine (RANEXA) 500 MG 12 hr tablet TAKE 1 TABLET BY MOUTH TWICE A DAY  . simvastatin (ZOCOR) 20 MG tablet TAKE 1 TABLET BY MOUTH EVERYDAY AT BEDTIME  . Vitamin D, Cholecalciferol, 50 MCG (2000 UT) CAPS Take 1 tablet by mouth daily.   No facility-administered encounter medications on file as of 09/29/2019.   Allergies  Allergen Reactions  . Benadryl [Diphenhydramine] Swelling and Other (See Comments)    Tongue swells and hallucinates- could not talk  . Codeine Nausea And Vomiting and Hypertension  . Meperidine Nausea Only, Swelling and Other (See Comments)    Tongue swells and "I feel like death"  . Prednisone     Hypertension   . Promethazine Other (See Comments)    Hypotension   . Propranolol Nausea And Vomiting  . Shellfish Allergy Nausea And Vomiting  . Tape Other (See Comments)    Tears the skin  . Tazarotene Other (See Comments)    Reaction not recalled   . Iodinated Diagnostic Agents Nausea Only and Rash   SDOH Screenings   Alcohol Screen:   . Last Alcohol Screening Score (AUDIT):   Depression (PHQ2-9):   . PHQ-2 Score:   Financial Resource Strain:   . Difficulty of Paying Living Expenses:   Food Insecurity:   .  Worried About Charity fundraiser in the Last Year:   . [place] in the Last Year:   Housing:   . Last Housing Risk Score:   Physical Activity:   . Days of Exercise per Week:   . Minutes of Exercise per Session:   Social Connections:   . Frequency of Communication with Friends and Family:   . Frequency of Social Gatherings with Friends and Family:   . Attends Religious Services:   . Active Member of Clubs or Organizations:   . Attends Archivist Meetings:   Marland Kitchen Marital Status:   Stress:   . Feeling of Stress :   Tobacco Use: Low  Risk   . Smoking Tobacco Use: Never Smoker  . Smokeless Tobacco Use: Never Used  Transportation Needs:   . Film/video editor (Medical):   Marland Kitchen Lack of Transportation (Non-Medical):      Current Diagnosis/Assessment:  Goals Addressed              This Visit's Progress   .  Pharmacy Care Plan (pt-stated)        CARE PLAN ENTRY (see longitudinal plan of care for additional care plan information)  Current Barriers:  . Chronic Disease Management support, education, and care coordination needs related to Constipation, Hypertension and Hyperlipidemia   Hypertension BP Readings from Last 3 Encounters:  11/10/19 (!) 130/56  11/08/19 (!) 116/41  08/20/19 136/70   . Pharmacist Clinical Goal(s): o Over the next 90 days, patient will work with PharmD and providers to maintain BP goal <130/80 . Current regimen:  o Losartan 25 mg daily  . Interventions: o Reviewed home blood pressure readings.  o Reviewed and update blood pressure medications.  . Patient self care activities - Over the next 90 days, patient will: o Check BP daily, document, and provide at future appointments o Ensure daily salt intake < 2300 mg/day  Hyperlipidemia Lab Results  Component Value Date/Time   LDLCALC 120 (H) 11/04/2019 06:17 AM   LDLCALC 69 09/23/2018 02:12 PM   . Pharmacist Clinical Goal(s): o Over the next 90 days, patient will work with PharmD and providers to achieve LDL goal < 70 . Current regimen:  o Simvastatin 20 mg daily  o Aspirin 81 mg daily . Interventions: o Reviewed home medications.  o Patient declined pharmacy delivery/packaging at this time.  . Patient self care activities - Over the next 90 days, patient will: o Continue to take simvastatin 20 mg daily as prescribed.  o Continue to avoid fried foods.   Constipation  . Pharmacist Clinical Goal(s): o Over the next 90 days, patient will work with PharmD and providers to achieve improved bowel habits . Current regimen:   o Docusate 100 mg daily prn o Lactulase 20 gm/30 ml daily prn . Interventions: o Patient reports lactulose worked well for her but she isn't able to take daily due to stool too loose.  . Patient self care activities - Over the next 90 days, patient will: o Consider beginning Gerty for constipation if approved by provider  Medication management . Pharmacist Clinical Goal(s): o Over the next 90 days, patient will work with PharmD and providers to achieve optimal medication adherence . Current [facility]  . Interventions o Comprehensive medication review performed. o Continue current medication management strategy . Patient self care activities - Over the next 90 days, patient will: o Focus on medication adherence by using pill box o Take medications as prescribed o Report  any questions or concerns to PharmD and/or provider(s)  Please see past updates related to this goal by clicking on the "Past Updates" button in the selected goal         Hypertension   BP today is:  <130/80  Vitals with BMI 12/22/2019 12/01/2019 11/10/2019  Height $Remov'5\' 4"'zcuusJ$  $Remove'5\' 4"'FSlnaiC$  $RemoveB'5\' 4"'aLkKJTUb$   Weight 118 lbs 13 oz 120 lbs 10 oz 119 lbs  BMI 20.38 15.72 62.03  Systolic 559 741 638  Diastolic 56 68 56  Pulse 60 82 72      Patient has failed these meds in the past: lisinopril, bystolic, clonidine Patient is currently controlled on the following medications:   Losartan 25 mg daily   Patient checks BP at home daily  Patient home BP readings are ranging: 123/45  We discussed diet and exercise extensively. Patient reports blood pressure yesterday was 106/43 and she felt bad. Improved today. .   Plan  Continue current medications   Hypothyroidism   Lab Results  Component Value Date/Time   TSH 3.870 08/20/2019 12:02 PM   TSH 0.441 (L) 09/23/2018 02:12 PM    Patient has failed these meds in past: pravastatin Patient is currently controlled on the following medications:  . Levothyroxine 100 mcg  daily  We discussed:  Patient reports good administration technique. Stores on her bedside table and takes first thing each morning before food and other medications.   Plan  Continue current medications   CAD with History of NSTEMI   LDL goal < 70  Lipid Panel     Component Value Date/Time   CHOL 170 09/23/2018 1412   TRIG 185 (H) 09/23/2018 1412   HDL 64 09/23/2018 1412   LDLCALC 69 09/23/2018 1412    Hepatic Function Latest Ref Rng & Units 08/06/2019 07/15/2018 05/10/2018  Total Protein 6.0 - 8.5 g/dL 6.7 4.8(L) 7.2  Albumin 3.6 - 4.6 g/dL 4.2 2.7(L) 4.2  AST 0 - 40 IU/L $Remov'13 27 24  'qfKVVq$ ALT 0 - 32 IU/L 12 54(H) 26  Alk Phosphatase 48 - 121 IU/L 74 48 64  Total Bilirubin 0.0 - 1.2 mg/dL 0.3 0.9 0.6     The ASCVD Risk score ([place]., et al., 2013) failed to calculate for the following reasons:   The 2013 ASCVD risk score is only valid for ages 51 to 37   The patient has a prior MI or stroke diagnosis   Patient has failed these meds in past: Ranexa Patient is currently controlled on the following medications:  . Aspirin 81 mg daily . Nitroglycerin 0.4 mg prn chest pain . Simvastatin 20 mg daily at bedtime  We discussed:  diet and exercise extensively   Adherence: Patient reports good adherence to simvastatin. She uses a pill box each night. Dr. Loletha Grayer confirmed she was supposed to stop Ranexa.   Plan  Continue current medications   Constipation   Patient has failed these meds in past: senna, bisacodyl Patient is currently uncontrolled on the following medications:  . Docusate 100 mg daily prn constipation.  . Lactulose 20 grams daily prn constipation  We discussed:  Patient struggles with constipation. Lactulose has helped. She reports using it as needed. Cannot tolerate daily.   Plan  Continue current medications  Epigastric Pain   Patient has failed these meds in past: maalox, dicyclomine, omeprazole Patient is currently uncontrolled on the following  medications:  . Pantoprazole 40 mg daily . Zenpep before meals/snacks  - samples provided in office.   We  discussed:   She is still using samples of Zenpep for feeling of pressure/fullness after small amounts of food. Patient reports upper stomach remains swollen. States she continues to have steady gnawing pain. States she needs to see Dr. Lyndel Safe. Has a visit scheduled 11/19.   Plan  Continue current medications. Patient coordinating appointment with digestive specialist.     Pain   Patient has failed these meds in past: oxycodone Patient is currently controlled on the following medications:  . Acetaminophen 975 mg every 8 hours prn pain or headache . Hydrocodone-apap 5-325 mg every 6 hours prn moderate pain  We discussed:  Patient reports rarely using prn pain medication.   Plan  Continue current medications  Glaucoma   Patient has failed these meds in past: travopost, timolol Patient is currently controlled on the following medications:  Marland Kitchen Dorzolamide-timolol eye drops 1 drop into the left eye 2 times daily . Latanoprost 0.005% left eye at bedtime  We discussed:  Patient managed by Dr. Edilia Bo. Reports tolerating eye drops well and regular follow-up with provider.   Plan  Continue current medications   Osteopenia / Osteoporosis   Patient has failed these meds in past: alendronate Patient is currently controlled on the following medications:  . Calcium citrate 250 unit with vitamin d daily  . Vitamin d 2000 units daily   We discussed:  Recommend [phone] units of vitamin D daily. Recommend 1200 mg of calcium daily from dietary and supplemental sources. Recommend weight-bearing and muscle strengthening exercises for building and maintaining bone density.  Plan  Continue current medications  Health Maintenance   Patient is currently controlled on the following medications:  . Probiotic daily - supplement . Magnesium 400 mg daily - supplement  We discussed:   Patient takes daily for supplementation.   Patient is delaying physical therapy until after GI visit to determine cause of epigastric pain/fullness.   Plan  Continue current medications   Medication Management   [facility] Uses pill box? Yes   We discussed: Patient no longer drive. Daughter fills her pill box up each week. Husband helps pick up medications. Patient declines [facility] delivery at this time. Daughter states they will be able to pick up medication and continue filling pill box.    Plan  Continue current medication management strategy    Follow up: 3 month phone visit

## 2020-01-09 ENCOUNTER — Telehealth: Payer: Self-pay

## 2020-01-09 ENCOUNTER — Encounter: Payer: Self-pay | Admitting: Gastroenterology

## 2020-01-09 ENCOUNTER — Ambulatory Visit (INDEPENDENT_AMBULATORY_CARE_PROVIDER_SITE_OTHER): Payer: Medicare Other | Admitting: Gastroenterology

## 2020-01-09 VITALS — BP 130/76 | HR 63 | Ht 64.0 in | Wt 120.5 lb

## 2020-01-09 DIAGNOSIS — R935 Abnormal findings on diagnostic imaging of other abdominal regions, including retroperitoneum: Secondary | ICD-10-CM | POA: Diagnosis not present

## 2020-01-09 DIAGNOSIS — R109 Unspecified abdominal pain: Secondary | ICD-10-CM | POA: Diagnosis not present

## 2020-01-09 DIAGNOSIS — K449 Diaphragmatic hernia without obstruction or gangrene: Secondary | ICD-10-CM

## 2020-01-09 MED ORDER — LACTULOSE 10 GM/15ML PO SOLN: 10.0000 g | Freq: Every day | ORAL | 3 refills | Status: DC

## 2020-01-09 NOTE — Chronic Care Management (AMB) (Signed)
Chronic Care Management Pharmacy Assistant   Name: Leah Pittman  MRN: [id number] DOB: 10-Nov-1930  Reason for Encounter: Medication Review    PCP : Blane Ohara, MD  Allergies:   Allergies  Allergen Reactions  . Benadryl [Diphenhydramine] Swelling and Other (See Comments)    Tongue swells and hallucinates- could not talk  . Zenpep [Pancrelipase (Lip-Prot-Amyl)] Rash  . Codeine Nausea And Vomiting and Hypertension  . Meperidine Nausea Only, Swelling and Other (See Comments)    Tongue swells and "I feel like death"  . Prednisone     Hypertension   . Promethazine Other (See Comments)    Hypotension   . Propranolol Nausea And Vomiting  . Shellfish Allergy Nausea And Vomiting  . Tape Other (See Comments)    Tears the skin  . Tazarotene Other (See Comments)    Reaction not recalled   . Iodinated Diagnostic Agents Nausea Only and Rash    Medications: Outpatient Encounter Medications as of 01/09/2020  Medication Sig  . acetaminophen (TYLENOL) 325 MG tablet Take 975 mg by mouth every 8 (eight) hours as needed for mild pain or headache.   Marland Kitchen aspirin 81 MG tablet Take 81 mg by mouth daily.  . Calcium Citrate-Vitamin D (CALCIUM CITRATE+D3 PETITES PO) Take 1 tablet by mouth daily.  Marland Kitchen docusate sodium (COLACE) 100 MG capsule Take 100 mg by mouth daily as needed for mild constipation.  . dorzolamide-timolol (COSOPT) 22.3-6.8 MG/ML ophthalmic solution Place 1 drop into the left eye 2 (two) times daily.   Marland Kitchen ENULOSE 10 GM/15ML SOLN Take by mouth.  . lactulose (CHRONULAC) 10 GM/15ML solution Take 15 mLs (10 g total) by mouth daily.  Marland Kitchen latanoprost (XALATAN) 0.005 % ophthalmic solution Place 1 drop into both eyes at bedtime.   Marland Kitchen levothyroxine (SYNTHROID) 100 MCG tablet Take 1 tablet (100 mcg total) by mouth daily before breakfast.  . losartan (COZAAR) 100 MG tablet Take 1 tablet (100 mg total) by mouth daily. (Patient taking differently: Take 25 mg by mouth daily. Patient reports  taking 25 mg daily.)  . magnesium oxide (MAG-OX) 400 MG tablet Take 400 mg by mouth daily. Chewable  . nitroGLYCERIN (NITROSTAT) 0.4 MG SL tablet Place 0.4 mg under the tongue every 5 (five) minutes as needed for chest pain.   Marland Kitchen ondansetron (ZOFRAN) 8 MG tablet Take 1 tablet (8 mg total) by mouth 2 (two) times daily as needed for nausea or vomiting.  . Pancrelipase, Lip-Prot-Amyl, (ZENPEP PO) Take 1 tablet by mouth daily. Take 30 minutes before your breakfast  . pantoprazole (PROTONIX) 40 MG tablet Take 1 tablet (40 mg total) by mouth daily.  . pregabalin (LYRICA) 25 MG capsule Take 25 mg by mouth in the morning, at noon, and at bedtime.  . Probiotic Product (PROBIOTIC-10 PO) Take 1 tablet by mouth daily in the afternoon.  . ranolazine (RANEXA) 500 MG 12 hr tablet TAKE 1 TABLET BY MOUTH TWICE A DAY  . simvastatin (ZOCOR) 20 MG tablet Take 1 tablet (20 mg total) by mouth at bedtime.  . triamcinolone cream (KENALOG) 0.1 % SMARTSIG:1 Application Topical 2-3 Times Daily  . Vitamin D, Cholecalciferol, 50 MCG (2000 UT) CAPS Take 1 tablet by mouth daily.   No facility-administered encounter medications on file as of 01/09/2020.    Current Diagnosis: Patient Active Problem List   Diagnosis Date Noted  . Unstable gait 12/22/2019  . Paresthesia of both feet 12/22/2019  . Bruising 12/22/2019  . Hypothyroidism   . Pancreatic duct  dilated   . Descending thoracic aortic dissection (HCC)   . Elevated blood pressure reading   . Acute cystitis with hematuria 08/20/2019  . Hoarseness of voice 08/20/2019  . Medication management 08/20/2019  . SOB (shortness of breath) 08/20/2019  . Irritable bowel syndrome with constipation 08/20/2019  . Chronic obstructive pulmonary disease (HCC) 08/20/2019  . Abdominal pain, chronic, epigastric 08/20/2019  . Chest pain 07/24/2019  . Hypertensive crisis 07/24/2019  . Neck pain 07/24/2019  . S/P CABG x 4 07/10/2018  . Coronary artery disease involving native  coronary artery of native heart without angina pectoris 07/08/2018  . Essential hypertension 08/14/2017  . Hypercholesterolemia 11/30/2015  . Precordial chest pain 01/13/2015  . Chest pain with high risk for cardiac etiology 12/12/2013  . Glaucoma 09/29/2012  . Anxiety   . Labile hypertension   . Migraine headache       Follow-Up:  Patient Assistance Coordination - Reviewed chart and adherence measures . Per insurance data total gaps - all measures are (2) . Total gaps - star measures are (0). 1. Wellness Bundle not performed 2. Annual Wellness not performed.  Levada Dy Manson Passey, CPP  Jon Gills, [facility] Clinical Pharmacist Assistant [phone]

## 2020-01-09 NOTE — Progress Notes (Signed)
Chief Complaint:   Referring Provider:  Blane Ohara, MD      ASSESSMENT AND PLAN;   #1. Chronic epi pain with GERD/small HH.   #2. IBS-C with chronic abdo pain and bloating.  Had few episodes of diarrhea. Assoc anxiety.  #3. Abn MRI showing mildly dilated MPD without any masses or other red flag symptoms.  Stable since 2019.  Life expectancy < 42yr d/t advanced age   Plan: -CBC, CMP and lipase -MRI sept 2022, if patient and patient's family desires.  I would advise against it -Print CTA and MRCP report -UGI with SB series. If abn, would consider EGD followed by [facility]. -For now continue Lactulose 1 TBS po qd. -Continue protonix 40 mg p.o. once a day -Please take current dose of Zenpep with meals -Stool studies for GI Pathogen (includes C. Diff), O&P, fecal elastase, fat and Calprotectin.  -FU in 12 weeks. -D/W pt and pt's daughter.    HPI:    Leah Pittman is a 86 y.o. female  With multiple medical problems including IBS-C, anxiety/depression, glaucoma (legally blind), history of diverticulitis 2019 with multiple chronic GI complaints.  She has been to multiple gastroenterologists.  C/O  epi pain, worse with eating.  Associated with abdominal bloating.  She does have mild nausea but no vomiting.  No dysphagia or odynophagia.  Denies having any weight loss.  This has been occurring over last 15-20 yrs.   She has history of chronic constipation, better with lactulose.  Had some diarrhea and was concerned  No fever chills or night sweats.  No jaundice dark urine or pale stools.   Wt Readings from Last 3 Encounters:  01/09/20 120 lb 8 oz (54.7 kg)  12/22/19 118 lb 12.8 oz (53.9 kg)  12/01/19 120 lb 9.6 oz (54.7 kg)    Past GI procedures:  EGD with dilatation 12/07/2016 -Schatzki's ring s/p dil 71fr -Small hiatal hernia -Mild gastritis with negative CLOtest.  Colonoscopy 07/2014 -Good preparation -Moderate predominantly sigmoid diverticulosis. -Recommended to  repeat colonoscopy only if with any new problems or red flag symptoms.  MRCP 10/2019 1. Stable dilation of the main pancreatic duct particularly in the head of the pancreas. No focal, suspicious pancreatic lesion with stable contour dating back to 2019. No signs of pancreatic inflammation to indicate pancreatitis. Given main duct distension would consider a 12 month follow-up either with CT or MRI. 2. Biliary duct distension following cholecystectomy is stable compared to studies from 2019. 3. Ahaustral appearance of the colon is suggested in some areas along with mild colonic wall thickening. Correlate with any clinical signs of colitis, this could be due to artifact. 4. Mild hepatic steatosis.  CTA chest/abdo/pelvis 11/10/2019 1. Similar-appearing peripheral crescentic hypodensity along the aortic arch that originates just distal to the left subclavian artery and terminates at the origin of the descending thoracic aorta that may represent a stable type B aortic dissection versus noncalcified thrombus in a patient with severe calcified and noncalcified atherosclerotic plaque of the aorta and its branches. No aorta aneurysm or intramural hematoma. 2. Persistent main pancreatic duct dilatation up to 5 mm within the head and neck of the pancreas. Findings may represent intraductal papillary mucinous neoplasm. 3. Couple of bibasilar pulmonary nodules measuring up to 5 mm in a patient demonstrating sequela of prior granulomatous disease.  CT AP 04/28/2017 -Worsening sigmoid diverticulitis -Benign 1.2 cm right lower lobe pulmonary nodule. -Small hiatal hernia. Past Medical History:  Diagnosis Date  . Anxiety   .  Arthritis   . Chest pain 2011   CARDIOLITE - no significant symptoms, EKG changes, or arrhythmias  . Congenital prolapse of bladder mucosa   . Diverticulosis   . Dyspnea 02/16/2012   2D ECHO - EF 55-60%, normal  . Esophageal dysphagia   . Fatigue   . GERD (gastroesophageal  reflux disease)   . Glaucoma   . Headache(784.0)   . Heart attack (HCC)   . Hiatal hernia   . High blood pressure 02/16/2012   RENAL DOPPLER - normal  . Hyperlipidemia   . Hypothyroidism   . Labile hypertension   . Lightheadedness   . Migraine headache   . Multiple allergies   . Myalgia   . OSA (obstructive sleep apnea)   . Pain, lower leg    Calf  . Problem of menstruation   . Proteinuria   . Reflux   . SOB (shortness of breath)   . Thyroid disease   . Urinary problem   . Valvular heart disease   . Vitamin B12 deficiency   . Vitamin D deficiency     Past Surgical History:  Procedure Laterality Date  . ABDOMINAL HYSTERECTOMY  1976  . CHOLECYSTECTOMY  2000  . COLONOSCOPY  07/27/2014   Moderate predominantly sigmoid divertuculosis. Otherwise normal colonoscopy  . CORONARY ARTERY BYPASS GRAFT N/A 07/10/2018   Procedure: CORONARY ARTERY BYPASS GRAFTING (CABG), FREE LIMA;  Surgeon: [name], MD;  Location: [facility] OR;  Service: Open Heart Surgery;  Laterality: N/A;  . CYST REMOVAL NECK    . CYSTECTOMY  1974   Intestine  . CYSTECTOMY  1996   Brain stem  . ESOPHAGOGASTRODUODENOSCOPY  12/07/2016   Schatzki's ring status post esophageal dilatation. Small hiatal hernia. Mild gastritis  . EYE SURGERY  2003  . EYE SURGERY  07/2016  . LEFT HEART CATH AND CORONARY ANGIOGRAPHY N/A 07/08/2018   Procedure: LEFT HEART CATH AND CORONARY ANGIOGRAPHY;  Surgeon: Runell Gess, MD;  Location: [facility] INVASIVE [facility];  Service: Cardiovascular;  Laterality: N/A;  . LEFT HEART CATH AND CORONARY ANGIOGRAPHY N/A 07/25/2019   Procedure: LEFT HEART CATH AND CORONARY ANGIOGRAPHY;  Surgeon: Kathleene Hazel, MD;  Location: [facility] CV [facility];  Service: Cardiovascular;  Laterality: N/A;  . MOUTH SURGERY  2013  . RADIOLOGY WITH ANESTHESIA N/A 11/05/2019   Procedure: MRI WITH ANESTHESIA;  Surgeon: Radiologist, Medication, MD;  Location: [facility] OR;  Service: Radiology;  Laterality: N/A;  . TEE WITHOUT  CARDIOVERSION N/A 07/10/2018   Procedure: TRANSESOPHAGEAL ECHOCARDIOGRAM (TEE);  Surgeon: [name], MD;  Location: [facility];  Service: Open Heart Surgery;  Laterality: N/A;  . TONSILLECTOMY     age 75    Family History  Problem Relation Age of Onset  . Heart attack Father   . Stroke Father   . Cancer Sister   . Cancer Brother   . Asthma Brother   . Cancer Brother     Social History   Tobacco Use  . Smoking status: Never Smoker  . Smokeless tobacco: Never Used  Vaping Use  . Vaping Use: Never used  Substance Use Topics  . Alcohol use: No  . Drug use: No    Current Outpatient Medications  Medication Sig Dispense Refill  . acetaminophen (TYLENOL) 325 MG tablet Take 975 mg by mouth every 8 (eight) hours as needed for mild pain or headache.     Marland Kitchen aspirin 81 MG tablet Take 81 mg by mouth daily.    . Calcium  Citrate-Vitamin D (CALCIUM CITRATE+D3 PETITES PO) Take 1 tablet by mouth daily.    Marland Kitchen docusate sodium (COLACE) 100 MG capsule Take 100 mg by mouth daily as needed for mild constipation.    . dorzolamide-timolol (COSOPT) 22.3-6.8 MG/ML ophthalmic solution Place 1 drop into the left eye 2 (two) times daily.     Marland Kitchen ENULOSE 10 GM/15ML SOLN Take by mouth.    . latanoprost (XALATAN) 0.005 % ophthalmic solution Place 1 drop into both eyes at bedtime.     Marland Kitchen levothyroxine (SYNTHROID) 100 MCG tablet Take 1 tablet (100 mcg total) by mouth daily before breakfast. 30 tablet 0  . losartan (COZAAR) 100 MG tablet Take 1 tablet (100 mg total) by mouth daily. (Patient taking differently: Take 25 mg by mouth daily. Patient reports taking 25 mg daily.) 30 tablet 1  . magnesium oxide (MAG-OX) 400 MG tablet Take 400 mg by mouth daily. Chewable    . nitroGLYCERIN (NITROSTAT) 0.4 MG SL tablet Place 0.4 mg under the tongue every 5 (five) minutes as needed for chest pain.     Marland Kitchen ondansetron (ZOFRAN) 8 MG tablet Take 1 tablet (8 mg total) by mouth 2 (two) times daily as needed for nausea or vomiting. 40  tablet 1  . Pancrelipase, Lip-Prot-Amyl, (ZENPEP PO) Take 1 tablet by mouth daily. Take 30 minutes before your breakfast    . pantoprazole (PROTONIX) 40 MG tablet Take 1 tablet (40 mg total) by mouth daily. 90 tablet 0  . pregabalin (LYRICA) 25 MG capsule Take 25 mg by mouth in the morning, at noon, and at bedtime.    . Probiotic Product (PROBIOTIC-10 PO) Take 1 tablet by mouth daily in the afternoon.    . ranolazine (RANEXA) 500 MG 12 hr tablet TAKE 1 TABLET BY MOUTH TWICE A DAY 180 tablet 1  . simvastatin (ZOCOR) 20 MG tablet Take 1 tablet (20 mg total) by mouth at bedtime. 90 tablet 0  . triamcinolone cream (KENALOG) 0.1 % SMARTSIG:1 Application Topical 2-3 Times Daily    . Vitamin D, Cholecalciferol, 50 MCG (2000 UT) CAPS Take 1 tablet by mouth daily.     No current facility-administered medications for this visit.    Allergies  Allergen Reactions  . Benadryl [Diphenhydramine] Swelling and Other (See Comments)    Tongue swells and hallucinates- could not talk  . Zenpep [Pancrelipase (Lip-Prot-Amyl)] Rash  . Codeine Nausea And Vomiting and Hypertension  . Meperidine Nausea Only, Swelling and Other (See Comments)    Tongue swells and "I feel like death"  . Prednisone     Hypertension   . Promethazine Other (See Comments)    Hypotension   . Propranolol Nausea And Vomiting  . Shellfish Allergy Nausea And Vomiting  . Tape Other (See Comments)    Tears the skin  . Tazarotene Other (See Comments)    Reaction not recalled   . Iodinated Diagnostic Agents Nausea Only and Rash    Review of Systems:  Constitutional: Denies fever, chills, diaphoresis, appetite change and has  fatigue.  HEENT: Denies photophobia, eye pain, redness, hearing loss, ear pain, congestion, sore throat, rhinorrhea, sneezing, mouth sores, neck pain, neck stiffness and tinnitus.   Respiratory: Denies SOB, DOE, cough, chest tightness,  and wheezing.   Cardiovascular: Denies chest pain, palpitations and leg  swelling.  Genitourinary: Denies dysuria, urgency, frequency, hematuria, flank pain and difficulty urinating.  Musculoskeletal: has myalgias, back pain, joint swelling, arthralgias and gait problem.  Skin: No rash.  Neurological: Denies dizziness, seizures, syncope,  weakness, light-headedness, numbness and headaches.  Hematological: Denies adenopathy. Easy bruising, personal or family bleeding history  Psychiatric/Behavioral: has anxiety or depression     Physical Exam:    BP 130/76   Pulse 63   Ht 5\' 4"  (1.626 m)   Wt 120 lb 8 oz (54.7 kg)   BMI 20.68 kg/m  Wt Readings from Last 3 Encounters:  01/09/20 120 lb 8 oz (54.7 kg)  12/22/19 118 lb 12.8 oz (53.9 kg)  12/01/19 120 lb 9.6 oz (54.7 kg)   Constitutional:  Well-developed, in no acute distress. Psychiatric: Normal mood and affect. Behavior is normal. HEENT: Pupils normal.  Conjunctivae are normal. No scleral icterus. Cardiovascular: Normal rate, regular rhythm. No edema Pulmonary/chest: Effort normal and breath sounds normal. No wheezing, rales or rhonchi. Abdominal: Soft, nondistended. Nontender. Bowel sounds active throughout. There are no masses palpable. No hepatomegaly. Rectal: Deferred Neurological: Alert and oriented to person place and time. Skin: Skin is warm and dry. No rashes noted.  Data Reviewed: I have personally reviewed following labs and imaging studies  CBC: CBC Latest Ref Rng & Units 12/22/2019 11/07/2019 11/06/2019  WBC 3.4 - 10.8 x10E3/uL 7.9 7.5 9.1  Hemoglobin 11.1 - 15.9 g/dL 13.012.4 86.512.4 11.8(L)  Hematocrit 34.0 - 46.6 % 36.6 37.5 35.3(L)  Platelets 150 - 450 x10E3/uL 231 225 207    CMP: CMP Latest Ref Rng & Units 12/22/2019 11/05/2019 11/04/2019  Glucose 65 - 99 mg/dL 92 94 784(O113(H)  BUN 8 - 27 mg/dL 18 13 14   Creatinine 0.57 - 1.00 mg/dL 9.620.94 9.520.81 8.410.80  Sodium 134 - 144 mmol/L 140 140 138  Potassium 3.5 - 5.2 mmol/L 4.8 3.8 3.6  Chloride 96 - 106 mmol/L 104 109 105  CO2 20 - 29 mmol/L 25 23 23     Calcium 8.7 - 10.3 mg/dL 32.410.1 9.2 9.8  Total Protein 6.0 - 8.5 g/dL 6.8 4.0(N5.7(L) 6.4(L)  Total Bilirubin 0.0 - 1.2 mg/dL 0.3 0.6 0.3  Alkaline Phos 44 - 121 IU/L 74 45 52  AST 0 - 40 IU/L 10 18 21   ALT 0 - 32 IU/L 14 18 19       Edman Circleaj Toya Palacios, MD 01/09/2020, 10:01 AM  Cc: Blane Oharaox, [name], MD

## 2020-01-09 NOTE — Patient Instructions (Signed)
We have sent the following medications to your pharmacy for you to pick up at your convenience:  Lactulose  If you are age 84 or older, your body mass index should be between 23-30. Your Body mass index is 20.68 kg/m. If this is out of the aforementioned range listed, please consider follow up with your Primary Care Provider.  If you are age 65 or younger, your body mass index should be between 19-25. Your Body mass index is 20.68 kg/m. If this is out of the aformentioned range listed, please consider follow up with your Primary Care Provider.   You have been scheduled for an Upper GI Series at [facility]. Your appointment is on 01/22/2020 at 9:30am. Please arrive 15 minutes prior to your test for registration. Make sure not to eat or drink anything after midnight on the night before your test. If you need to reschedule, please call radiology at [phone]. ________________________________________________________________ An upper GI series uses x rays to help diagnose problems of the upper GI tract, which includes the esophagus, stomach, and duodenum. The duodenum is the first part of the small intestine. An upper GI series is conducted by a radiology technologist or a radiologist--a doctor who specializes in x-ray imaging--at a hospital or outpatient center. While sitting or standing in front of an x-ray machine, the patient drinks barium liquid, which is often white and has a chalky consistency and taste. The barium liquid coats the lining of the upper GI tract and makes signs of disease show up more clearly on x rays. X-ray video, called fluoroscopy, is used to view the barium liquid moving through the esophagus, stomach, and duodenum. Additional x rays and fluoroscopy are performed while the patient lies on an x-ray table. To fully coat the upper GI tract with barium liquid, the technologist or radiologist may press on the abdomen or ask the patient to change position. Patients hold still  in various positions, allowing the technologist or radiologist to take x rays of the upper GI tract at different angles. If a technologist conducts the upper GI series, a radiologist will later examine the images to look for problems.  This test typically takes about 1 hour to complete. __________________________________________________________________

## 2020-01-14 ENCOUNTER — Other Ambulatory Visit: Payer: Self-pay | Admitting: Family Medicine

## 2020-01-19 ENCOUNTER — Telehealth: Payer: Self-pay | Admitting: Gastroenterology

## 2020-01-22 ENCOUNTER — Telehealth: Payer: Self-pay | Admitting: Gastroenterology

## 2020-01-22 ENCOUNTER — Other Ambulatory Visit (HOSPITAL_COMMUNITY): Payer: Medicare Other

## 2020-01-27 ENCOUNTER — Other Ambulatory Visit (INDEPENDENT_AMBULATORY_CARE_PROVIDER_SITE_OTHER): Payer: Medicare Other

## 2020-01-27 DIAGNOSIS — R109 Unspecified abdominal pain: Secondary | ICD-10-CM | POA: Diagnosis not present

## 2020-01-27 LAB — COMPREHENSIVE METABOLIC PANEL
ALT: 15 U/L (ref 0–35)
AST: 14 U/L (ref 0–37)
Albumin: 4 g/dL (ref 3.5–5.2)
Alkaline Phosphatase: 61 U/L (ref 39–117)
BUN: 16 mg/dL (ref 6–23)
CO2: 28 mEq/L (ref 19–32)
Calcium: 10 mg/dL (ref 8.4–10.5)
Chloride: 103 mEq/L (ref 96–112)
Creatinine, Ser: 0.86 mg/dL (ref 0.40–1.20)
GFR: 60.94 mL/min (ref >60.00)
Glucose, Bld: 100 mg/dL — ABNORMAL HIGH (ref 70–99)
Potassium: 4.1 mEq/L (ref 3.5–5.1)
Sodium: 136 mEq/L (ref 135–145)
Total Bilirubin: 0.3 mg/dL (ref 0.2–1.2)
Total Protein: 6.8 g/dL (ref 6.0–8.3)

## 2020-01-27 LAB — CBC WITH DIFFERENTIAL/PLATELET
Basophils Absolute: 0.1 10*3/uL (ref 0.0–0.1)
Basophils Relative: 1.3 % (ref 0.0–3.0)
Eosinophils Absolute: 0.4 10*3/uL (ref 0.0–0.7)
Eosinophils Relative: 5.2 % — ABNORMAL HIGH (ref 0.0–5.0)
HCT: 37.3 % (ref 36.0–46.0)
Hemoglobin: 12.7 g/dL (ref 12.0–15.0)
Lymphocytes Relative: 17.2 % (ref 12.0–46.0)
Lymphs Abs: 1.4 10*3/uL (ref 0.7–4.0)
MCHC: 34.1 g/dL (ref 30.0–36.0)
MCV: 93.5 fl (ref 78.0–100.0)
Monocytes Absolute: 1 10*3/uL (ref 0.1–1.0)
Monocytes Relative: 12.4 % — ABNORMAL HIGH (ref 3.0–12.0)
Neutro Abs: 5.2 10*3/uL (ref 1.4–7.7)
Neutrophils Relative %: 63.9 % (ref 43.0–77.0)
Platelets: 255 10*3/uL (ref 150.0–400.0)
RBC: 3.99 Mil/uL (ref 3.87–5.11)
RDW: 15.2 % (ref 11.5–15.5)
WBC: 8.2 10*3/uL (ref 4.0–10.5)

## 2020-01-27 LAB — LIPASE: Lipase: 46 U/L (ref 11.0–59.0)

## 2020-01-28 ENCOUNTER — Other Ambulatory Visit: Payer: Self-pay

## 2020-01-28 ENCOUNTER — Encounter: Payer: Self-pay | Admitting: Cardiovascular Disease

## 2020-01-28 ENCOUNTER — Ambulatory Visit (INDEPENDENT_AMBULATORY_CARE_PROVIDER_SITE_OTHER): Payer: Medicare Other | Admitting: Cardiovascular Disease

## 2020-01-28 ENCOUNTER — Other Ambulatory Visit: Payer: Medicare Other

## 2020-01-28 VITALS — BP 128/76 | HR 73 | Ht 64.0 in | Wt 119.0 lb

## 2020-01-28 DIAGNOSIS — I4891 Unspecified atrial fibrillation: Secondary | ICD-10-CM

## 2020-01-28 DIAGNOSIS — I2510 Atherosclerotic heart disease of native coronary artery without angina pectoris: Secondary | ICD-10-CM

## 2020-01-28 DIAGNOSIS — I10 Essential (primary) hypertension: Secondary | ICD-10-CM

## 2020-01-28 DIAGNOSIS — I9789 Other postprocedural complications and disorders of the circulatory system, not elsewhere classified: Secondary | ICD-10-CM

## 2020-01-28 DIAGNOSIS — E7800 Pure hypercholesterolemia, unspecified: Secondary | ICD-10-CM

## 2020-01-28 DIAGNOSIS — R109 Unspecified abdominal pain: Secondary | ICD-10-CM

## 2020-01-28 DIAGNOSIS — R0602 Shortness of breath: Secondary | ICD-10-CM

## 2020-01-28 NOTE — Progress Notes (Signed)
Cardiology Office Note    Date:  01/28/2020   ID:  Leah Pittman, DOB [birth year], MRN [id number]  PCP:  Blane Ohara, MD  Cardiologist:   Thurmon Fair, MD   Chief Complaint  Patient presents with  . Follow-up  . Chest Pain  . Shortness of Breath    History of Present Illness:  Leah Pittman is a 86 y.o. female with HTN and severe hypercholesterolemia, labile HTN and multivessel CAD s/p CABG (07/10/2018, [place], LIMA to LAD, SVG to diagonal, sequential SVG to OM1+OM 2), complicated by transient postoperative atrial fibrillation.  She is here with her husband Leah Pittman who is also my patient.  She has a port of bradycardia of your somatic complaints, none of which really fit well into an easily recognizable syndrome.  She has had a hard time sleeping due to pain that is "all over".  She pats 2 different locations on her left chest, presses down in her epigastrium and points to the lower half of her abdomen as well.  She also complains of cramps that occur in her ankle and foot and make her foot turn that occur when she is lying in bed.  She will stand up and walk around and this leads to some relief of the cramping.  She also complains of a lot of shortness of breath "all the time".  Does not necessarily occur when she is undergoing any physical exertion.  It is often worse after she eats and she wonders whether something in her abdomen is pushing upwards and cutting off her breath.  Feels that her abdomen is distended.  She reports not eating a lot, but gaining weight.  She has not had any leg edema.  Does not have typical complaints to describe exertional angina or claudication and has not had any focal neurological events.  She denies palpitations, dizziness or syncope.  Sometimes she has waves of weakness.  If she is standing up at the kitchen sink she may feel extremely tired and short of breath and has to go sit down on the sofa for 15 to 20 minutes before she feels better.  She complains  of the fact that the muscles in her legs have shrunk.  Complains of cramps and pain in the trapezius muscle and posterior cervical area bilaterally, sometimes making her head turn.  Importantly, she did not describe the pattern of angina that had come to associated with her ischemic coronary syndromes.  She is to describe this with both hands pushing down deep in her epigastric area.  She has not had orthopnea, PND, lower extremity edema.  No longer has problems with cough or wheezing.   In 2013 renal artery duplex did not show evidence of renal artery stenosis.  Pre-bypass carotid scan showed no evidence of significant stenoses and there was antegrade flow in both vertebral arteries.  She has normal left ventricular systolic function and has diastolic dysfunction which appears appropriate for age.  Past Medical History:  Diagnosis Date  . Anxiety   . Arthritis   . Chest pain 2011   CARDIOLITE - no significant symptoms, EKG changes, or arrhythmias  . Congenital prolapse of bladder mucosa   . Diverticulosis   . Dyspnea 02/16/2012   2D ECHO - EF 55-60%, normal  . Esophageal dysphagia   . Fatigue   . GERD (gastroesophageal reflux disease)   . Glaucoma   . Headache(784.0)   . Heart attack (HCC)   . Hiatal hernia   . High  blood pressure 02/16/2012   RENAL DOPPLER - normal  . Hyperlipidemia   . Hypothyroidism   . Labile hypertension   . Lightheadedness   . Migraine headache   . Multiple allergies   . Myalgia   . OSA (obstructive sleep apnea)   . Pain, lower leg    Calf  . Problem of menstruation   . Proteinuria   . Reflux   . SOB (shortness of breath)   . Thyroid disease   . Urinary problem   . Valvular heart disease   . Vitamin B12 deficiency   . Vitamin D deficiency     Past Surgical History:  Procedure Laterality Date  . ABDOMINAL HYSTERECTOMY  1976  . CHOLECYSTECTOMY  2000  . COLONOSCOPY  07/27/2014   Moderate predominantly sigmoid divertuculosis. Otherwise normal  colonoscopy  . CORONARY ARTERY BYPASS GRAFT N/A 07/10/2018   Procedure: CORONARY ARTERY BYPASS GRAFTING (CABG), FREE LIMA;  Surgeon: [name], MD;  Location: [facility] OR;  Service: Open Heart Surgery;  Laterality: N/A;  . CYST REMOVAL NECK    . CYSTECTOMY  1974   Intestine  . CYSTECTOMY  1996   Brain stem  . ESOPHAGOGASTRODUODENOSCOPY  12/07/2016   Schatzki's ring status post esophageal dilatation. Small hiatal hernia. Mild gastritis  . EYE SURGERY  2003  . EYE SURGERY  07/2016  . LEFT HEART CATH AND CORONARY ANGIOGRAPHY N/A 07/08/2018   Procedure: LEFT HEART CATH AND CORONARY ANGIOGRAPHY;  Surgeon: Runell Gess, MD;  Location: [facility] INVASIVE [facility] LAB;  Service: Cardiovascular;  Laterality: N/A;  . LEFT HEART CATH AND CORONARY ANGIOGRAPHY N/A 07/25/2019   Procedure: LEFT HEART CATH AND CORONARY ANGIOGRAPHY;  Surgeon: Kathleene Hazel, MD;  Location: [facility] INVASIVE CV LAB;  Service: Cardiovascular;  Laterality: N/A;  . MOUTH SURGERY  2013  . RADIOLOGY WITH ANESTHESIA N/A 11/05/2019   Procedure: MRI WITH ANESTHESIA;  Surgeon: Radiologist, Medication, MD;  Location: [facility] OR;  Service: Radiology;  Laterality: N/A;  . TEE WITHOUT CARDIOVERSION N/A 07/10/2018   Procedure: TRANSESOPHAGEAL ECHOCARDIOGRAM (TEE);  Surgeon: [name], MD;  Location: [facility] OR;  Service: Open Heart Surgery;  Laterality: N/A;  . TONSILLECTOMY     age 78    Current Medications: Outpatient Medications Prior to Visit  Medication Sig Dispense Refill  . acetaminophen (TYLENOL) 325 MG tablet Take 975 mg by mouth every 8 (eight) hours as needed for mild pain or headache.     Marland Kitchen aspirin 81 MG tablet Take 81 mg by mouth daily.    . Calcium Citrate-Vitamin D (CALCIUM CITRATE+D3 PETITES PO) Take 1 tablet by mouth daily.    Marland Kitchen docusate sodium (COLACE) 100 MG capsule Take 100 mg by mouth daily as needed for mild constipation.    . dorzolamide-timolol (COSOPT) 22.3-6.8 MG/ML ophthalmic solution Place 1 drop into the left eye 2  (two) times daily.     Marland Kitchen ENULOSE 10 GM/15ML SOLN Take by mouth.    . lactulose (CHRONULAC) 10 GM/15ML solution Take 15 mLs (10 g total) by mouth daily. 450 mL 3  . latanoprost (XALATAN) 0.005 % ophthalmic solution Place 1 drop into both eyes at bedtime.     Marland Kitchen levothyroxine (SYNTHROID) 100 MCG tablet Take 1 tablet (100 mcg total) by mouth daily before breakfast. 30 tablet 0  . losartan (COZAAR) 100 MG tablet Take 1 tablet (100 mg total) by mouth daily. (Patient taking differently: Take 25 mg by mouth daily. Patient reports taking 25 mg daily.) 30 tablet 1  .  magnesium oxide (MAG-OX) 400 MG tablet Take 400 mg by mouth daily. Chewable    . nitroGLYCERIN (NITROSTAT) 0.4 MG SL tablet Place 0.4 mg under the tongue every 5 (five) minutes as needed for chest pain.     Marland Kitchen ondansetron (ZOFRAN) 8 MG tablet Take 1 tablet (8 mg total) by mouth 2 (two) times daily as needed for nausea or vomiting. 40 tablet 1  . Pancrelipase, Lip-Prot-Amyl, (ZENPEP PO) Take 1 tablet by mouth daily. Take 30 minutes before your breakfast    . pantoprazole (PROTONIX) 40 MG tablet TAKE 1 TABLET BY MOUTH EVERY DAY 90 tablet 0  . pregabalin (LYRICA) 25 MG capsule Take 25 mg by mouth in the morning, at noon, and at bedtime.    . Probiotic Product (PROBIOTIC-10 PO) Take 1 tablet by mouth daily in the afternoon.    . simvastatin (ZOCOR) 20 MG tablet Take 1 tablet (20 mg total) by mouth at bedtime. 90 tablet 0  . triamcinolone cream (KENALOG) 0.1 % SMARTSIG:1 Application Topical 2-3 Times Daily    . Vitamin D, Cholecalciferol, 50 MCG (2000 UT) CAPS Take 1 tablet by mouth daily.    . ranolazine (RANEXA) 500 MG 12 hr tablet TAKE 1 TABLET BY MOUTH TWICE A DAY 180 tablet 1   No facility-administered medications prior to visit.     Allergies:   Benadryl [diphenhydramine], Zenpep [pancrelipase (lip-prot-amyl)], Codeine, Meperidine, Prednisone, Promethazine, Propranolol, Shellfish allergy, Tape, Tazarotene, and Iodinated diagnostic agents    Social History   Socioeconomic History  . Marital status: Married    Spouse name: Not on file  . Number of children: 3  . Years of education: college  . Highest education level: Not on file  Occupational History  . Occupation: Retired  Tobacco Use  . Smoking status: Never Smoker  . Smokeless tobacco: Never Used  Vaping Use  . Vaping Use: Never used  Substance and Sexual Activity  . Alcohol use: No  . Drug use: No  . Sexual activity: Not on file  Other Topics Concern  . Not on file  Social History Narrative   Lives with husband in [place], [place]   Social Determinants of Health   Financial Resource Strain:   . Difficulty of Paying Living Expenses: Not on file  Food Insecurity: No Food Insecurity  . Worried About Programme researcher, broadcasting/film/video in the Last Year: Never true  . Ran Out of Food in the Last Year: Never true  Transportation Needs:   . Lack of Transportation (Medical): Not on file  . Lack of Transportation (Non-Medical): Not on file  Physical Activity:   . Days of Exercise per Week: Not on file  . Minutes of Exercise per Session: Not on file  Stress:   . Feeling of Stress : Not on file  Social Connections:   . Frequency of Communication with Friends and Family: Not on file  . Frequency of Social Gatherings with Friends and Family: Not on file  . Attends Religious Services: Not on file  . Active Member of Clubs or Organizations: Not on file  . Attends Banker Meetings: Not on file  . Marital Status: Not on file     Family History:  The patient's family history includes Asthma in her brother; Cancer in her brother, brother, and sister; Heart attack in her father; Stroke in her father.   ROS:   Please see the history of present illness.    ROS All other systems reviewed and are negative.  PHYSICAL EXAM:   VS:  BP 128/76 (BP Location: Left Arm, Patient Position: Sitting, Cuff Size: Normal)   Pulse 73   Ht 5\' 4"  (1.626 m)   Wt 119 lb (54 kg)   BMI  20.43 kg/m       General: Alert, oriented x3, no distress, very lean Head: no evidence of trauma, PERRL, EOMI, no exophtalmos or lid lag, no myxedema, no xanthelasma; normal ears, nose and oropharynx Neck: normal jugular venous pulsations and no hepatojugular reflux; brisk carotid pulses without delay and no carotid bruits Chest: clear to auscultation, no signs of consolidation by percussion or palpation, normal fremitus, symmetrical and full respiratory excursions Cardiovascular: normal position and quality of the apical impulse, regular rhythm, normal first and second heart sounds, no murmurs, rubs or gallops Abdomen: no tenderness or distention, no masses by palpation, no abnormal pulsatility or arterial bruits, normal bowel sounds, no hepatosplenomegaly Extremities: no clubbing, cyanosis or edema; 2+ radial, ulnar and brachial pulses bilaterally; 2+ right femoral, posterior tibial and dorsalis pedis pulses; 2+ left femoral, posterior tibial and dorsalis pedis pulses; no subclavian or femoral bruits Neurological: grossly nonfocal Psych: Normal mood and affect    Wt Readings from Last 3 Encounters:  01/28/20 119 lb (54 kg)  01/09/20 120 lb 8 oz (54.7 kg)  12/22/19 118 lb 12.8 oz (53.9 kg)    Studies/Labs Reviewed:   EKG:  EKG is ordered today.  It shows normal sinus rhythm with very minor nonspecific ST segment changes mostly in the lateral leads, QTC 460 ms.  No change from previous tracing. Recent Labs: 09/23/2018 Total cholesterol 170, HDL 64, LDL 90, triglycerides 185 Hemoglobin A1c 5.8% Hemoglobin 12.0, creatinine 0.84, potassium 4.3, normal TSH and liver function tests  01/21/2019 Total cholesterol 164, HDL 69, LDL 69, triglycerides 90 Hemoglobin A1c 5.7%, hemoglobin 12.6, creatinine 0.88, potassium 5.1, normal liver function tests, TSH 0.441 11/04/2019 Total cholesterol 206, HDL 69, LDL 120, triglycerides 86 11/06/2019 Hemoglobin 12.4, creatinine 0.94, potassium 4.8,  magnesium 2.2, normal liver function test, TSH 0.87    ASSESSMENT:    1. Coronary artery disease involving native coronary artery of native heart without angina pectoris   2. Shortness of breath   3. Essential hypertension   4. Postoperative atrial fibrillation (HCC)   5. Hypercholesterolemia      PLAN:  In order of problems listed above:  1. CAD s/p CABG: Has always been extremely challenging to separate angina pectoris from multiple variable complaints of discomfort in the chest and abdomen.  None of her current complaints seem to point towards coronary etiology.  Her symptoms mostly occur at rest and sometimes are worsened by eating.  There are no ischemic EKG changes.   2. Dyspnea: Likewise, the pattern does not suggest cardiac etiology;  will schedule for an echocardiogram to evaluate her shortness of breath at that we will pursue further evaluation for her coronary disease unless LVEF is low or there are new wall motion abnormalities. 3. Postop AFib: No recurrence since the immediate postop.  Not on anticoagulation. 4. HTN: Blood pressure is very well controlled. 5. HLP: Skeptical, but it is not out of the question that her multiple muscular complaints especially the ones involving her legs and her neck are sign of statin myopathy.  We will discontinue the simvastatin.  I confirmed that she is no longer taking ranolazine which could have an adverse interaction.  We will recommend a 75-month holiday, after we will recheck lipid profile.  Consider PCSK9 inhibitors, however  I know from her husband that expensive medications are impossible for them to afford.  We will have to find some type of assistance program     Medication Adjustments/Labs and Tests Ordered: Current medicines are reviewed at length with the patient today.  Concerns regarding medicines are outlined above.  Medication changes, Labs and Tests ordered today are listed in the Patient Instructions below. Patient  Instructions  Medication Instructions:  STOP the Simvastatin for now  *If you need a refill on your cardiac medications before your next appointment, please call your pharmacy*   Lab Work: Your provider would like for you to return in 2 months to have the following labs drawn: fasting Lipid. You do not need an appointment for the lab. Once in our office lobby there is a podium where you can sign in and ring the doorbell to alert us that you are here. The lab is open from 8:00 am to 4:30 pm; closed for lunch from 12:45pm-1:45pm.  If you have labs (blood work) drawn today and your tests are completely normal, you will receive your results only by: Marland Kitchen. MyChart Message (if you have MyChart) OR . A paper copy in the mail If you have any lab test that is abnormal or we need to change your treatment, we will call you to review the results.   Testing/Procedures: Your physician has requested that you have an echocardiogram. Echocardiography is a painless test that uses sound waves to create images of your heart. It provides your doctor with information about the size and shape of your heart and how well your heart's chambers and valves are working. You may receive an ultrasound enhancing agent through an IV if needed to better visualize your heart during the echo.This procedure takes approximately one hour. There are no restrictions for this procedure.    Follow-Up: At [facility], you and your health needs are our priority.  As part of our continuing mission to provide you with exceptional heart care, we have created designated Provider Care Teams.  These Care Teams include your primary Cardiologist (physician) and Advanced Practice Providers (APPs -  Physician Assistants and Nurse Practitioners) who all work together to provide you with the care you need, when you need it.  We recommend signing up for the patient portal called "MyChart".  Sign up information is provided on this After Visit Summary.   MyChart is used to connect with patients for Virtual Visits (Telemedicine).  Patients are able to view lab/test results, encounter notes, upcoming appointments, etc.  Non-urgent messages can be sent to your provider as well.   To learn more about what you can do with MyChart, go to [email or web address].    Your next appointment:   3 month(s)  The format for your next appointment:   In Person  Provider:   You may see Thurmon FairMihai Zane Samson, MD or one of the following Advanced Practice Providers on your designated Care Team:    Azalee CourseHao Meng, PA-C  Micah FlesherAngela Duke, [place]PA-C or   Judy PimpleKrista Kroeger, PA-C       Signed, Thurmon FairMihai Sherman Lipuma, MD  01/28/2020 5:55 PM    [facility] [address], [place], [place]  [address] Phone: [phone]) [phone]; Fax: [phone](336) [phone]

## 2020-01-28 NOTE — Patient Instructions (Signed)
Medication Instructions:  STOP the Simvastatin for now  *If you need a refill on your cardiac medications before your next appointment, please call your pharmacy*   Lab Work: Your provider would like for you to return in 2 months to have the following labs drawn: fasting Lipid. You do not need an appointment for the lab. Once in our office lobby there is a podium where you can sign in and ring the doorbell to alert [place] that you are here. The lab is open from 8:00 am to 4:30 pm; closed for lunch from 12:45pm-1:45pm.  If you have labs (blood work) drawn today and your tests are completely normal, you will receive your results only by: Marland Kitchen MyChart Message (if you have MyChart) OR . A paper copy in the mail If you have any lab test that is abnormal or we need to change your treatment, we will call you to review the results.   Testing/Procedures: Your physician has requested that you have an echocardiogram. Echocardiography is a painless test that uses sound waves to create images of your heart. It provides your doctor with information about the size and shape of your heart and how well your heart's chambers and valves are working. You may receive an ultrasound enhancing agent through an IV if needed to better visualize your heart during the echo.This procedure takes approximately one hour. There are no restrictions for this procedure.    Follow-Up: At [facility], you and your health needs are our priority.  As part of our continuing mission to provide you with exceptional heart care, we have created designated Provider Care Teams.  These Care Teams include your primary Cardiologist (physician) and Advanced Practice Providers (APPs -  Physician Assistants and Nurse Practitioners) who all work together to provide you with the care you need, when you need it.  We recommend signing up for the patient portal called "MyChart".  Sign up information is provided on this After Visit Summary.  MyChart is  used to connect with patients for Virtual Visits (Telemedicine).  Patients are able to view lab/test results, encounter notes, upcoming appointments, etc.  Non-urgent messages can be sent to your provider as well.   To learn more about what you can do with MyChart, go to [email or web address].    Your next appointment:   3 month(s)  The format for your next appointment:   In Person  Provider:   You may see Thurmon Fair, MD or one of the following Advanced Practice Providers on your designated Care Team:    Azalee Course, PA-C  Micah Flesher, PA-C or   Judy Pimple, [place]

## 2020-01-31 LAB — GI PROFILE, STOOL, PCR

## 2020-01-31 LAB — CALPROTECTIN, FECAL: Calprotectin, Fecal: 24 ug/g (ref 0–120)

## 2020-02-04 ENCOUNTER — Ambulatory Visit (HOSPITAL_COMMUNITY)
Admission: RE | Admit: 2020-02-04 | Discharge: 2020-02-04 | Disposition: A | Discharge status: Home / Self Care | Payer: Medicare Other | Source: Ambulatory Visit | Attending: Gastroenterology | Admitting: Gastroenterology

## 2020-02-04 ENCOUNTER — Other Ambulatory Visit: Payer: Self-pay

## 2020-02-04 ENCOUNTER — Encounter: Payer: Self-pay | Admitting: Gastroenterology

## 2020-02-04 DIAGNOSIS — R109 Unspecified abdominal pain: Secondary | ICD-10-CM | POA: Insufficient documentation

## 2020-02-09 LAB — OVA AND PARASITE EXAMINATION
CONCENTRATE RESULT:: NONE SEEN
MICRO NUMBER:: 11292516
SPECIMEN QUALITY:: ADEQUATE
TRICHROME RESULT:: NONE SEEN

## 2020-02-09 LAB — GASTROINTESTINAL PATHOGEN PANEL PCR
C. difficile Tox A/B, PCR: NOT DETECTED
Campylobacter, PCR: NOT DETECTED
Cryptosporidium, PCR: NOT DETECTED
E coli (ETEC) LT/ST PCR: NOT DETECTED
E coli (STEC) stx1/stx2, PCR: NOT DETECTED
E coli 0157, PCR: NOT DETECTED
Giardia lamblia, PCR: NOT DETECTED
Norovirus, PCR: NOT DETECTED
Rotavirus A, PCR: NOT DETECTED
Salmonella, PCR: NOT DETECTED
Shigella, PCR: NOT DETECTED

## 2020-02-09 LAB — PANCREATIC ELASTASE, FECAL: Pancreatic Elastase-1, Stool: >500 ug/g

## 2020-03-04 ENCOUNTER — Telehealth: Payer: Self-pay

## 2020-03-04 NOTE — Progress Notes (Signed)
Chronic Care Management Pharmacy Assistant   Name: Leah Pittman  MRN: [id number] DOB: 07-May-1930  Reason for Encounter:  General Adherence Call   Patient Questions:  1.  Have you seen any other providers since your last visit? Yes 01/28/20 Dr. Croitoru-Cardiology 01/09/20 Dr. Chales Abrahams - Gastro   2.  Any changes in your medicines or health?Yes, Cardiologist stopped simvastatin as of 01/28/20     PCP : Blane Ohara, MD  Allergies:   Allergies  Allergen Reactions  . Benadryl [Diphenhydramine] Swelling and Other (See Comments)    Tongue swells and hallucinates- could not talk  . Zenpep [Pancrelipase (Lip-Prot-Amyl)] Rash  . Codeine Nausea And Vomiting and Hypertension  . Meperidine Nausea Only, Swelling and Other (See Comments)    Tongue swells and "I feel like death"  . Prednisone     Hypertension   . Promethazine Other (See Comments)    Hypotension   . Propranolol Nausea And Vomiting  . Shellfish Allergy Nausea And Vomiting  . Tape Other (See Comments)    Tears the skin  . Tazarotene Other (See Comments)    Reaction not recalled   . Iodinated Diagnostic Agents Nausea Only and Rash    Medications: Outpatient Encounter Medications as of 03/04/2020  Medication Sig  . acetaminophen (TYLENOL) 325 MG tablet Take 975 mg by mouth every 8 (eight) hours as needed for mild pain or headache.   Marland Kitchen aspirin 81 MG tablet Take 81 mg by mouth daily.  . Calcium Citrate-Vitamin D (CALCIUM CITRATE+D3 PETITES PO) Take 1 tablet by mouth daily.  Marland Kitchen docusate sodium (COLACE) 100 MG capsule Take 100 mg by mouth daily as needed for mild constipation.  . dorzolamide-timolol (COSOPT) 22.3-6.8 MG/ML ophthalmic solution Place 1 drop into the left eye 2 (two) times daily.   Marland Kitchen ENULOSE 10 GM/15ML SOLN Take by mouth.  . lactulose (CHRONULAC) 10 GM/15ML solution Take 15 mLs (10 g total) by mouth daily.  Marland Kitchen latanoprost (XALATAN) 0.005 % ophthalmic solution Place 1 drop into both eyes at bedtime.   Marland Kitchen  levothyroxine (SYNTHROID) 100 MCG tablet Take 1 tablet (100 mcg total) by mouth daily before breakfast.  . losartan (COZAAR) 100 MG tablet Take 1 tablet (100 mg total) by mouth daily. (Patient taking differently: Take 25 mg by mouth daily. Patient reports taking 25 mg daily.)  . magnesium oxide (MAG-OX) 400 MG tablet Take 400 mg by mouth daily. Chewable  . nitroGLYCERIN (NITROSTAT) 0.4 MG SL tablet Place 0.4 mg under the tongue every 5 (five) minutes as needed for chest pain.   Marland Kitchen ondansetron (ZOFRAN) 8 MG tablet Take 1 tablet (8 mg total) by mouth 2 (two) times daily as needed for nausea or vomiting.  . Pancrelipase, Lip-Prot-Amyl, (ZENPEP PO) Take 1 tablet by mouth daily. Take 30 minutes before your breakfast  . pantoprazole (PROTONIX) 40 MG tablet TAKE 1 TABLET BY MOUTH EVERY DAY  . pregabalin (LYRICA) 25 MG capsule Take 25 mg by mouth in the morning, at noon, and at bedtime.  . Probiotic Product (PROBIOTIC-10 PO) Take 1 tablet by mouth daily in the afternoon.  . simvastatin (ZOCOR) 20 MG tablet Take 1 tablet (20 mg total) by mouth at bedtime.  . triamcinolone cream (KENALOG) 0.1 % SMARTSIG:1 Application Topical 2-3 Times Daily  . Vitamin D, Cholecalciferol, 50 MCG (2000 UT) CAPS Take 1 tablet by mouth daily.   No facility-administered encounter medications on file as of 03/04/2020.    Current Diagnosis: Patient Active Problem List  Diagnosis Date Noted  . Unstable gait 12/22/2019  . Paresthesia of both feet 12/22/2019  . Bruising 12/22/2019  . Hypothyroidism   . Pancreatic duct dilated   . Descending thoracic aortic dissection (HCC)   . Elevated blood pressure reading   . Acute cystitis with hematuria 08/20/2019  . Hoarseness of voice 08/20/2019  . Medication management 08/20/2019  . SOB (shortness of breath) 08/20/2019  . Irritable bowel syndrome with constipation 08/20/2019  . Chronic obstructive pulmonary disease (HCC) 08/20/2019  . Abdominal pain, chronic, epigastric 08/20/2019   . Chest pain 07/24/2019  . Hypertensive crisis 07/24/2019  . Neck pain 07/24/2019  . S/P CABG x 4 07/10/2018  . Coronary artery disease involving native coronary artery of native heart without angina pectoris 07/08/2018  . Essential hypertension 08/14/2017  . Hypercholesterolemia 11/30/2015  . Precordial chest pain 01/13/2015  . Chest pain with high risk for cardiac etiology 12/12/2013  . Glaucoma 09/29/2012  . Anxiety   . Labile hypertension   . Migraine headache    Spoke with the patient today and she is taking her medication as directed except for her blood [ressure medication, she only takes it if her reading is high.  She stated she is not going to start [facility] as so now due to her medications changing a lot lately.  The patient was in some distress today, she stated she was hurting in her upper stomach, she has nausea all the time, she tries to eat soft foods, she stated she does have issues with chronic constipation.    She stated that Dr. Sedalia Muta had referred her to Dr. Chales Abrahams for her stomach issues but she didn't have much faith in him, he called her with her chalk test results and she hadn't even had the test yet, she went to [facility] and was told her pancreas was enlarged and he said she was fine and had not even examined her yet.  She had her chalk test at [facility] and has not gotten the results as of yet.   I advised her to call the office today and let them know how much pain she is in and that she needed her test results.  The patient stated she doesn't drive, and her husband has been in and out of the hospital with CHF so that makes her getting to the office difficult.  The patient states she is having some SOB when exerting herself, she had taken out the trash and was having trouble catching her breath, she stated she has chronic congestion and her voice sounds hoarse and raspy.    The patient stated she is forcing herself to drink so she doesn't get  dehydrated.     Follow-Up:  Pharmacist Review  Lucia Gaskins, CPP notified  Leilani Able, [facility] Clinical Pharmacist Assistant [phone]

## 2020-03-23 ENCOUNTER — Other Ambulatory Visit: Payer: Self-pay | Admitting: Family Medicine

## 2020-03-24 ENCOUNTER — Other Ambulatory Visit: Payer: Self-pay

## 2020-03-25 ENCOUNTER — Other Ambulatory Visit: Payer: Self-pay | Admitting: Family Medicine

## 2020-03-25 MED ORDER — LEVOTHYROXINE SODIUM 100 MCG PO TABS
100.0000 ug | ORAL_TABLET | Freq: Every day | ORAL | 1 refills | Status: DC
Start: 2020-03-25 — End: 2020-09-27

## 2020-03-25 MED ORDER — LEVOTHYROXINE SODIUM 100 MCG PO TABS
100.0000 ug | ORAL_TABLET | Freq: Every day | ORAL | 1 refills | Status: DC
Start: 2020-03-25 — End: 2020-03-25

## 2020-03-31 ENCOUNTER — Telehealth: Payer: Medicare Other

## 2020-03-31 NOTE — Progress Notes (Deleted)
Chronic Care Management Pharmacy Note  03/31/2020 Name:  Leah Pittman MRN:  [id number] DOB:  1930/07/29  Subjective: Leah Pittman is an 87 y.o. year old female who is a primary patient of [name], [name], MD.  The CCM team was consulted for assistance with disease management and care coordination needs.    Engaged with patient by telephone for follow up visit in response to provider referral for pharmacy case management and/or care coordination services.   Consent to Services:  The patient was given information about Chronic Care Management services, agreed to services, and gave verbal consent prior to initiation of services.  Please see initial visit note for detailed documentation.   Patient Care Team: Rochel Brome, MD as PCP - General (Family Medicine) Sanda Klein, MD as PCP - Cardiology (Cardiology) Burnice Logan, [facility] as Pharmacist (Pharmacist) Burnice Logan, [facility] as Pharmacist (Pharmacist)  Recent office visits: 12/22/2019 - no source of muscle/back pain detected in labs.   Recent consult visits: ***03/03/2020 - Opthamology - start Travatan Z, Stop latanoprost. Artificial tears prn. Dorzolamide-timolol.  01/28/2020 - cardiology - stop ranolazine. Consider PCSK9 at next labs. Echo ordered 01/09/2020 - GI - lactulose 1 tablespoonful daily. Continue pantoprazole 40 mg daily. Zenpep with meals.   Hospital visits: 10/2019 [facility] admission for chest pain/hypertensive crisis   Objective:  Lab Results  Component Value Date   CREATININE 0.86 01/27/2020   BUN 16 01/27/2020   GFR 60.94 01/27/2020   GFRNONAA 55 (L) 12/22/2019   GFRAA 64 12/22/2019   NA 136 01/27/2020   K 4.1 01/27/2020   CALCIUM 10.0 01/27/2020   CO2 28 01/27/2020    Lab Results  Component Value Date/Time   HGBA1C 5.8 (H) 07/10/2018 04:21 AM   GFR 60.94 01/27/2020 04:11 PM    Last diabetic Eye exam: No results found for: HMDIABEYEEXA  Last diabetic Foot exam: No results found for:  HMDIABFOOTEX   Lab Results  Component Value Date   CHOL 206 (H) 11/04/2019   HDL 69 11/04/2019   LDLCALC 120 (H) 11/04/2019   TRIG 86 11/04/2019   CHOLHDL 3.0 11/04/2019    Hepatic Function Latest Ref Rng & Units 01/27/2020 12/22/2019 11/05/2019  Total Protein 6.0 - 8.3 g/dL 6.8 6.8 5.7(L)  Albumin 3.5 - 5.2 g/dL 4.0 4.3 3.3(L)  AST 0 - 37 U/L $Remo'14 10 18  'wjglw$ ALT 0 - 35 U/L $Remo'15 14 18  'LRxzv$ Alk Phosphatase 39 - 117 U/L 61 74 45  Total Bilirubin 0.2 - 1.2 mg/dL 0.3 0.3 0.6  Bilirubin, Direct 0.0 - 0.2 mg/dL - - -    Lab Results  Component Value Date/Time   TSH 0.879 12/22/2019 11:40 AM   TSH 6.815 (H) 11/03/2019 03:28 PM   TSH 3.870 08/20/2019 12:02 PM   FREET4 1.14 (H) 11/04/2019 06:17 AM    CBC Latest Ref Rng & Units 01/27/2020 12/22/2019 11/07/2019  WBC 4.0 - 10.5 K/uL 8.2 7.9 7.5  Hemoglobin 12.0 - 15.0 g/dL 12.7 12.4 12.4  Hematocrit 36.0 - 46.0 % 37.3 36.6 37.5  Platelets 150.0 - 400.0 K/uL 255.0 231 225    No results found for: VD25OH  Clinical ASCVD: Yes  The ASCVD Risk score Mikey Bussing DC Jr., et al., 2013) failed to calculate for the following reasons:   The 2013 ASCVD risk score is only valid for ages 92 to 37   The patient has a prior MI or stroke diagnosis    Depression screen [facility] 2/9 12/22/2019 09/11/2018  Decreased Interest  0 0  Down, Depressed, Hopeless 0 0  PHQ - 2 Score 0 0     ***Other: (CHADS2VASc if Afib, MMRC or CAT for COPD, ACT, DEXA)  Social History   Tobacco Use  Smoking Status Never Smoker  Smokeless Tobacco Never Used   BP Readings from Last 3 Encounters:  01/28/20 128/76  01/09/20 130/76  12/22/19 (!) 124/56   Pulse Readings from Last 3 Encounters:  01/28/20 73  01/09/20 63  12/22/19 60   Wt Readings from Last 3 Encounters:  01/28/20 119 lb (54 kg)  01/09/20 120 lb 8 oz (54.7 kg)  12/22/19 118 lb 12.8 oz (53.9 kg)    Assessment/Interventions: Review of patient past medical history, allergies, medications, health status, including review of  consultants reports, laboratory and other test data, was performed as part of comprehensive evaluation and provision of chronic care management services.   SDOH:  (Social Determinants of Health) assessments and interventions performed: Yes   CCM Care Plan  Allergies  Allergen Reactions  . Benadryl [Diphenhydramine] Swelling and Other (See Comments)    Tongue swells and hallucinates- could not talk  . Zenpep [Pancrelipase (Lip-Prot-Amyl)] Rash  . Codeine Nausea And Vomiting and Hypertension  . Meperidine Nausea Only, Swelling and Other (See Comments)    Tongue swells and "I feel like death"  . Prednisone     Hypertension   . Promethazine Other (See Comments)    Hypotension   . Propranolol Nausea And Vomiting  . Shellfish Allergy Nausea And Vomiting  . Tape Other (See Comments)    Tears the skin  . Tazarotene Other (See Comments)    Reaction not recalled   . Iodinated Diagnostic Agents Nausea Only and Rash    Medications Reviewed Today    Reviewed by Rush Landmark, [place] (Certified Medical Assistant) on 01/28/20 at 1551  Med List Status: <None>  Medication Order Taking? Sig Documenting Provider Last Dose Status Informant  acetaminophen (TYLENOL) 325 MG tablet [id number] Yes Take 975 mg by mouth every 8 (eight) hours as needed for mild pain or headache.  [provider] Taking Active Self  aspirin 81 MG tablet [id number] Yes Take 81 mg by mouth daily. [provider] Taking Active Self  Calcium Citrate-Vitamin D (CALCIUM CITRATE+D3 PETITES PO) [id number] Yes Take 1 tablet by mouth daily. [provider] Taking Active Self  docusate sodium (COLACE) 100 MG capsule [id number] Yes Take 100 mg by mouth daily as needed for mild constipation. [provider] Taking Active   dorzolamide-timolol (COSOPT) 22.3-6.8 MG/ML ophthalmic solution [id number] Yes Place 1 drop into the left eye 2 (two) times daily.  [provider] Taking Active Self  ENULOSE  10 GM/15ML SOLN [id number] Yes Take by mouth. [provider] Taking Active   lactulose (CHRONULAC) 10 GM/15ML solution [id number] Yes Take 15 mLs (10 g total) by mouth daily. Jackquline Denmark, MD Taking Active   latanoprost (XALATAN) 0.005 % ophthalmic solution [id number] Yes Place 1 drop into both eyes at bedtime.  [provider] Taking Active Self  levothyroxine (SYNTHROID) 100 MCG tablet [id number] Yes Take 1 tablet (100 mcg total) by mouth daily before breakfast. [name], [name], MD Taking Active   losartan (COZAAR) 100 MG tablet [id number] Yes Take 1 tablet (100 mg total) by mouth daily.  Patient taking differently: Take 25 mg by mouth daily. Patient reports taking 25 mg daily.   Caren Griffins, MD Taking Active   magnesium oxide (MAG-OX) 400 MG tablet [id number] Yes Take 400 mg by  mouth daily. Chewable [provider] Taking Active Self  nitroGLYCERIN (NITROSTAT) 0.4 MG SL tablet [id number] Yes Place 0.4 mg under the tongue every 5 (five) minutes as needed for chest pain.  [provider] Taking Active Self           Med Note Wynetta Emery   Thu Jul 24, 2019  7:58 PM)    ondansetron (ZOFRAN) 8 MG tablet [id number] Yes Take 1 tablet (8 mg total) by mouth 2 (two) times daily as needed for nausea or vomiting. [name], [name], MD Taking Active Self  Pancrelipase, Lip-Prot-Amyl, (ZENPEP PO) [id number] Yes Take 1 tablet by mouth daily. Take 30 minutes before your breakfast [provider] Taking Active   pantoprazole (PROTONIX) 40 MG tablet [id number] Yes TAKE 1 TABLET BY MOUTH EVERY DAY [name], [name], MD Taking Active   pregabalin (LYRICA) 25 MG capsule [id number] Yes Take 25 mg by mouth in the morning, at noon, and at bedtime. [provider] Taking Active   Probiotic Product (PROBIOTIC-10 PO) [id number] Yes Take 1 tablet by mouth daily in the afternoon. [provider] Taking Active   ranolazine (RANEXA) 500 MG 12 hr tablet [id number] Yes TAKE 1  TABLET BY MOUTH TWICE A DAY Marge Duncans, PA-C Taking Active Self  simvastatin (ZOCOR) 20 MG tablet [id number] Yes Take 1 tablet (20 mg total) by mouth at bedtime. [name], [name], MD Taking Active   triamcinolone cream (KENALOG) 0.1 % [id number] Yes SMARTSIG:1 Application Topical 2-3 Times Daily [provider] Taking Active   Vitamin D, Cholecalciferol, 50 MCG (2000 UT) CAPS [id number] Yes Take 1 tablet by mouth daily. [provider] Taking Active Self          Patient Active Problem List   Diagnosis Date Noted  . Unstable gait 12/22/2019  . Paresthesia of both feet 12/22/2019  . Bruising 12/22/2019  . Hypothyroidism   . Pancreatic duct dilated   . Descending thoracic aortic dissection ([place])   . Elevated blood pressure reading   . Acute cystitis with hematuria 08/20/2019  . Hoarseness of voice 08/20/2019  . Medication management 08/20/2019  . SOB (shortness of breath) 08/20/2019  . Irritable bowel syndrome with constipation 08/20/2019  . Chronic obstructive pulmonary disease ([place]) 08/20/2019  . Abdominal pain, chronic, epigastric 08/20/2019  . Chest pain 07/24/2019  . Hypertensive crisis 07/24/2019  . Neck pain 07/24/2019  . S/P CABG x 4 07/10/2018  . Coronary artery disease involving native coronary artery of native heart without angina pectoris 07/08/2018  . Essential hypertension 08/14/2017  . Hypercholesterolemia 11/30/2015  . Precordial chest pain 01/13/2015  . Chest pain with high risk for cardiac etiology 12/12/2013  . Glaucoma 09/29/2012  . Anxiety   . Labile hypertension   . Migraine headache     Immunization History  Administered Date(s) Administered  . Tdap 09/25/2014    Conditions to be addressed/monitored:  Hypertension, Hyperlipidemia, Coronary Artery Disease, COPD and IBS-C.   There are no care plans that you recently modified to display for this patient.    Medication Assistance: {MEDASSISTANCEINFO:25044}  Patient's preferred pharmacy  is:  [id number] - [place], [address] [place] [address] Phone: [phone] Fax: [phone]  [facility] - [place], [place] - [address] [place] [address] Phone: [phone] Fax: [phone]  Uses pill box? {Yes or If no, why not?:20788} Pt endorses ***% compliance  We  discussed: Benefits of medication synchronization, packaging and delivery as well as enhanced pharmacist oversight with [facility]. Patient decided to: Continue current medication management strategy  Follow Up:  Patient agrees to Care Plan and Follow-up.  Plan: Telephone follow up appointment with care management team member scheduled for:  ***  ***  Current Barriers:  . {pharmacybarriers:24917} . ***  Pharmacist Clinical Goal(s):  Marland Kitchen Over the next 90 days, patient will {PHARMACYGOALCHOICES:24921} through collaboration with PharmD and provider.  . ***  Interventions: . 1:1 collaboration with Rochel Brome, MD regarding development and update of comprehensive plan of care as evidenced by provider attestation and co-signature . Inter-disciplinary care team collaboration (see longitudinal plan of care) . Comprehensive medication review performed; medication list updated in electronic medical record  Hypertension (BP goal {CHL HP [facility] Pharmacist BP ranges:[phone]}) -{CHL Controlled/Uncontrolled:[phone]} -Current treatment: . *** -Medications previously tried: ***  -Current home readings: *** -Current dietary habits: *** -Current exercise habits: *** -{ACTIONS;DENIES/REPORTS:21021675::"Denies"} hypotensive/hypertensive symptoms -Educated on {CCM BP Counseling:25124} -Counseled to monitor BP at home ***, document, and provide log at future appointments -{CCMPHARMDINTERVENTION:25122}  Hyperlipidemia: (LDL goal < ***) -{CHL Controlled/Uncontrolled:[phone]} -Current  treatment: . *** -Medications previously tried: ***  -Current dietary patterns: *** -Current exercise habits: *** -Educated on {CCM HLD Counseling:25126} -{CCMPHARMDINTERVENTION:25122}  COPD (Goal: control symptoms and prevent exacerbations) -{CHL Controlled/Uncontrolled:[phone]} -Current treatment  . *** -Medications previously tried: ***  -Gold Grade: {CHL HP [facility] COPD Gold GPQDI:[id number]} -Current COPD Classification:  {CHL HP [facility] COPD Classification:[phone]} -MMRC/CAT score: *** -Pulmonary function testing: *** -Exacerbations requiring treatment in last 6 months: *** -Patient {Actions; denies-reports:120008} consistent use of maintenance inhaler -Frequency of rescue inhaler use: *** -Counseled on {CCMINHALERCOUNSELING:25121} -{CCMPHARMDINTERVENTION:25122}  *** (Goal: ***) -{CHL Controlled/Uncontrolled:[phone]} -Current treatment  . *** -Medications previously tried: ***  -{CCMPHARMDINTERVENTION:25122}  Health Maintenance -Vaccine gaps: *** -Current therapy:  . *** -Educated on {ccm supplement counseling:25128} -{CCM Patient satisfied:25129} -{CCMPHARMDINTERVENTION:25122}   Patient Goals/Self-Care Activities . Over the next *** days, patient will:  - {pharmacypatientgoals:24919}  Follow Up Plan: {CM FOLLOW UP MHWK:08811}

## 2020-04-08 ENCOUNTER — Telehealth: Payer: Medicare Other

## 2020-04-08 NOTE — Progress Notes (Deleted)
Chronic Care Management Pharmacy Note  04/08/2020 Name:  Leah Pittman MRN:  [id number] DOB:  06-15-1930  Subjective: Leah Pittman is an 87 y.o. year old female who is a primary patient of [name], [name], MD.  The CCM team was consulted for assistance with disease management and care coordination needs.    Engaged with patient by telephone for follow up visit in response to provider referral for pharmacy case management and/or care coordination services.   Consent to Services:  The patient was given information about Chronic Care Management services, agreed to services, and gave verbal consent prior to initiation of services.  Please see initial visit note for detailed documentation.   Patient Care Team: Rochel Brome, MD as PCP - General (Family Medicine) Sanda Klein, MD as PCP - Cardiology (Cardiology) Burnice Logan, [facility] as Pharmacist (Pharmacist) Burnice Logan, [facility] as Pharmacist (Pharmacist)  Recent office visits: 12/22/2019 - no source of muscle/back pain detected in labs.   Recent consult visits: ***03/03/2020 - Opthamology - start Travatan Z, Stop latanoprost. Artificial tears prn. Dorzolamide-timolol.  01/28/2020 - cardiology - stop ranolazine. Consider PCSK9 at next labs. Echo ordered 01/09/2020 - GI - lactulose 1 tablespoonful daily. Continue pantoprazole 40 mg daily. Zenpep with meals.   Hospital visits: 10/2019 [facility] admission for chest pain/hypertensive crisis   Objective:  Lab Results  Component Value Date   CREATININE 0.86 01/27/2020   BUN 16 01/27/2020   GFR 60.94 01/27/2020   GFRNONAA 55 (L) 12/22/2019   GFRAA 64 12/22/2019   NA 136 01/27/2020   K 4.1 01/27/2020   CALCIUM 10.0 01/27/2020   CO2 28 01/27/2020    Lab Results  Component Value Date/Time   HGBA1C 5.8 (H) 07/10/2018 04:21 AM   GFR 60.94 01/27/2020 04:11 PM    Last diabetic Eye exam: No results found for: HMDIABEYEEXA  Last diabetic Foot exam: No results found for:  HMDIABFOOTEX   Lab Results  Component Value Date   CHOL 206 (H) 11/04/2019   HDL 69 11/04/2019   LDLCALC 120 (H) 11/04/2019   TRIG 86 11/04/2019   CHOLHDL 3.0 11/04/2019    Hepatic Function Latest Ref Rng & Units 01/27/2020 12/22/2019 11/05/2019  Total Protein 6.0 - 8.3 g/dL 6.8 6.8 5.7(L)  Albumin 3.5 - 5.2 g/dL 4.0 4.3 3.3(L)  AST 0 - 37 U/L $Remo'14 10 18  'wZWfw$ ALT 0 - 35 U/L $Remo'15 14 18  'OSSRA$ Alk Phosphatase 39 - 117 U/L 61 74 45  Total Bilirubin 0.2 - 1.2 mg/dL 0.3 0.3 0.6  Bilirubin, Direct 0.0 - 0.2 mg/dL - - -    Lab Results  Component Value Date/Time   TSH 0.879 12/22/2019 11:40 AM   TSH 6.815 (H) 11/03/2019 03:28 PM   TSH 3.870 08/20/2019 12:02 PM   FREET4 1.14 (H) 11/04/2019 06:17 AM    CBC Latest Ref Rng & Units 01/27/2020 12/22/2019 11/07/2019  WBC 4.0 - 10.5 K/uL 8.2 7.9 7.5  Hemoglobin 12.0 - 15.0 g/dL 12.7 12.4 12.4  Hematocrit 36.0 - 46.0 % 37.3 36.6 37.5  Platelets 150.0 - 400.0 K/uL 255.0 231 225    No results found for: VD25OH  Clinical ASCVD: Yes  The ASCVD Risk score Mikey Bussing DC Jr., et al., 2013) failed to calculate for the following reasons:   The 2013 ASCVD risk score is only valid for ages 15 to 80   The patient has a prior MI or stroke diagnosis    Depression screen [facility] 2/9 12/22/2019 09/11/2018  Decreased Interest  0 0  Down, Depressed, Hopeless 0 0  PHQ - 2 Score 0 0     ***Other: (CHADS2VASc if Afib, MMRC or CAT for COPD, ACT, DEXA)  Social History   Tobacco Use  Smoking Status Never Smoker  Smokeless Tobacco Never Used   BP Readings from Last 3 Encounters:  01/28/20 128/76  01/09/20 130/76  12/22/19 (!) 124/56   Pulse Readings from Last 3 Encounters:  01/28/20 73  01/09/20 63  12/22/19 60   Wt Readings from Last 3 Encounters:  01/28/20 119 lb (54 kg)  01/09/20 120 lb 8 oz (54.7 kg)  12/22/19 118 lb 12.8 oz (53.9 kg)    Assessment/Interventions: Review of patient past medical history, allergies, medications, health status, including review of  consultants reports, laboratory and other test data, was performed as part of comprehensive evaluation and provision of chronic care management services.   SDOH:  (Social Determinants of Health) assessments and interventions performed: Yes   CCM Care Plan  Allergies  Allergen Reactions  . Benadryl [Diphenhydramine] Swelling and Other (See Comments)    Tongue swells and hallucinates- could not talk  . Zenpep [Pancrelipase (Lip-Prot-Amyl)] Rash  . Codeine Nausea And Vomiting and Hypertension  . Meperidine Nausea Only, Swelling and Other (See Comments)    Tongue swells and "I feel like death"  . Prednisone     Hypertension   . Promethazine Other (See Comments)    Hypotension   . Propranolol Nausea And Vomiting  . Shellfish Allergy Nausea And Vomiting  . Tape Other (See Comments)    Tears the skin  . Tazarotene Other (See Comments)    Reaction not recalled   . Iodinated Diagnostic Agents Nausea Only and Rash    Medications Reviewed Today    Reviewed by Rush Landmark, [place] (Certified Medical Assistant) on 01/28/20 at 1551  Med List Status: <None>  Medication Order Taking? Sig Documenting Provider Last Dose Status Informant  acetaminophen (TYLENOL) 325 MG tablet [id number] Yes Take 975 mg by mouth every 8 (eight) hours as needed for mild pain or headache.  [provider] Taking Active Self  aspirin 81 MG tablet [id number] Yes Take 81 mg by mouth daily. [provider] Taking Active Self  Calcium Citrate-Vitamin D (CALCIUM CITRATE+D3 PETITES PO) [id number] Yes Take 1 tablet by mouth daily. [provider] Taking Active Self  docusate sodium (COLACE) 100 MG capsule [id number] Yes Take 100 mg by mouth daily as needed for mild constipation. [provider] Taking Active   dorzolamide-timolol (COSOPT) 22.3-6.8 MG/ML ophthalmic solution [id number] Yes Place 1 drop into the left eye 2 (two) times daily.  [provider] Taking Active Self  ENULOSE  10 GM/15ML SOLN [id number] Yes Take by mouth. [provider] Taking Active   lactulose (CHRONULAC) 10 GM/15ML solution [id number] Yes Take 15 mLs (10 g total) by mouth daily. Jackquline Denmark, MD Taking Active   latanoprost (XALATAN) 0.005 % ophthalmic solution [id number] Yes Place 1 drop into both eyes at bedtime.  [provider] Taking Active Self  levothyroxine (SYNTHROID) 100 MCG tablet [id number] Yes Take 1 tablet (100 mcg total) by mouth daily before breakfast. [name], [name], MD Taking Active   losartan (COZAAR) 100 MG tablet [id number] Yes Take 1 tablet (100 mg total) by mouth daily.  Patient taking differently: Take 25 mg by mouth daily. Patient reports taking 25 mg daily.   Caren Griffins, MD Taking Active   magnesium oxide (MAG-OX) 400 MG tablet [id number] Yes Take 400 mg by  mouth daily. Chewable [provider] Taking Active Self  nitroGLYCERIN (NITROSTAT) 0.4 MG SL tablet [id number] Yes Place 0.4 mg under the tongue every 5 (five) minutes as needed for chest pain.  [provider] Taking Active Self           Med Note Wynetta Emery   Thu Jul 24, 2019  7:58 PM)    ondansetron (ZOFRAN) 8 MG tablet [id number] Yes Take 1 tablet (8 mg total) by mouth 2 (two) times daily as needed for nausea or vomiting. [name], [name], MD Taking Active Self  Pancrelipase, Lip-Prot-Amyl, (ZENPEP PO) [id number] Yes Take 1 tablet by mouth daily. Take 30 minutes before your breakfast [provider] Taking Active   pantoprazole (PROTONIX) 40 MG tablet [id number] Yes TAKE 1 TABLET BY MOUTH EVERY DAY [name], [name], MD Taking Active   pregabalin (LYRICA) 25 MG capsule [id number] Yes Take 25 mg by mouth in the morning, at noon, and at bedtime. [provider] Taking Active   Probiotic Product (PROBIOTIC-10 PO) [id number] Yes Take 1 tablet by mouth daily in the afternoon. [provider] Taking Active   ranolazine (RANEXA) 500 MG 12 hr tablet [id number] Yes TAKE 1  TABLET BY MOUTH TWICE A DAY Marge Duncans, PA-C Taking Active Self  simvastatin (ZOCOR) 20 MG tablet [id number] Yes Take 1 tablet (20 mg total) by mouth at bedtime. [name], [name], MD Taking Active   triamcinolone cream (KENALOG) 0.1 % [id number] Yes SMARTSIG:1 Application Topical 2-3 Times Daily [provider] Taking Active   Vitamin D, Cholecalciferol, 50 MCG (2000 UT) CAPS [id number] Yes Take 1 tablet by mouth daily. [provider] Taking Active Self          Patient Active Problem List   Diagnosis Date Noted  . Unstable gait 12/22/2019  . Paresthesia of both feet 12/22/2019  . Bruising 12/22/2019  . Hypothyroidism   . Pancreatic duct dilated   . Descending thoracic aortic dissection ([place])   . Elevated blood pressure reading   . Acute cystitis with hematuria 08/20/2019  . Hoarseness of voice 08/20/2019  . Medication management 08/20/2019  . SOB (shortness of breath) 08/20/2019  . Irritable bowel syndrome with constipation 08/20/2019  . Chronic obstructive pulmonary disease ([place]) 08/20/2019  . Abdominal pain, chronic, epigastric 08/20/2019  . Chest pain 07/24/2019  . Hypertensive crisis 07/24/2019  . Neck pain 07/24/2019  . S/P CABG x 4 07/10/2018  . Coronary artery disease involving native coronary artery of native heart without angina pectoris 07/08/2018  . Essential hypertension 08/14/2017  . Hypercholesterolemia 11/30/2015  . Precordial chest pain 01/13/2015  . Chest pain with high risk for cardiac etiology 12/12/2013  . Glaucoma 09/29/2012  . Anxiety   . Labile hypertension   . Migraine headache     Immunization History  Administered Date(s) Administered  . Tdap 09/25/2014    Conditions to be addressed/monitored:  Hypertension, Hyperlipidemia, Coronary Artery Disease, COPD and IBS-C.   There are no care plans that you recently modified to display for this patient.    Medication Assistance: {MEDASSISTANCEINFO:25044}  Patient's preferred pharmacy  is:  [id number] - [place], [place] [address] [address] Phone: [phone] Fax: [phone]  [facility] - [place], [place] - [address] [place] [address] Phone: [phone] Fax: [phone]  Uses pill box? {Yes or If no, why not?:20788} Pt endorses ***% compliance  We  discussed: Benefits of medication synchronization, packaging and delivery as well as enhanced pharmacist oversight with [facility]. Patient decided to: Continue current medication management strategy  Follow Up:  Patient agrees to Care Plan and Follow-up.  Plan: Telephone follow up appointment with care management team member scheduled for:  ***  ***  Current Barriers:  . {pharmacybarriers:24917} . ***  Pharmacist Clinical Goal(s):  Marland Kitchen Over the next 90 days, patient will {PHARMACYGOALCHOICES:24921} through collaboration with PharmD and provider.  . ***  Interventions: . 1:1 collaboration with Rochel Brome, MD regarding development and update of comprehensive plan of care as evidenced by provider attestation and co-signature . Inter-disciplinary care team collaboration (see longitudinal plan of care) . Comprehensive medication review performed; medication list updated in electronic medical record  Hypertension (BP goal {CHL HP [facility] Pharmacist BP [phone]}) -{CHL Controlled/[phone]} -Current treatment: . *** -Medications previously tried: ***  -Current home readings: *** -Current dietary habits: *** -Current exercise habits: *** -{ACTIONS;DENIES/REPORTS:21021675::"Denies"} hypotensive/hypertensive symptoms -Educated on {CCM BP Counseling:25124} -Counseled to monitor BP at home ***, document, and provide log at future appointments -{CCMPHARMDINTERVENTION:25122}  Hyperlipidemia: (LDL goal < ***) -{CHL Controlled/Uncontrolled:([phone]} -Current  treatment: . *** -Medications previously tried: ***  -Current dietary patterns: *** -Current exercise habits: *** -Educated on {CCM HLD Counseling:25126} -{CCMPHARMDINTERVENTION:25122}  COPD (Goal: control symptoms and prevent exacerbations) -{CHL Controlled/Uncontrolled:([phone]} -Current treatment  . *** -Medications previously tried: ***  -Gold Grade: {CHL HP [facility] Pharm COPD Gold EVOJJ:[id number]} -Current COPD Classification:  {CHL HP [facility] Pharm COPD Classification:[phone]} -MMRC/CAT score: *** -Pulmonary function testing: *** -Exacerbations requiring treatment in last 6 months: *** -Patient {Actions; denies-reports:120008} consistent use of maintenance inhaler -Frequency of rescue inhaler use: *** -Counseled on {CCMINHALERCOUNSELING:25121} -{CCMPHARMDINTERVENTION:25122}  *** (Goal: ***) -{CHL Controlled/[phone]} -Current treatment  . *** -Medications previously tried: ***  -{CCMPHARMDINTERVENTION:25122}  Health Maintenance -Vaccine gaps: *** -Current therapy:  . *** -Educated on {ccm supplement counseling:25128} -{CCM Patient satisfied:25129} -{CCMPHARMDINTERVENTION:25122}   Patient Goals/Self-Care Activities . Over the next *** days, patient will:  - {pharmacypatientgoals:24919}  Follow Up Plan: {CM FOLLOW UP BZJI:96789}

## 2020-04-09 ENCOUNTER — Ambulatory Visit (INDEPENDENT_AMBULATORY_CARE_PROVIDER_SITE_OTHER): Payer: Medicare Other

## 2020-04-09 ENCOUNTER — Other Ambulatory Visit: Payer: Self-pay

## 2020-04-09 DIAGNOSIS — K5900 Constipation, unspecified: Secondary | ICD-10-CM

## 2020-04-09 DIAGNOSIS — I10 Essential (primary) hypertension: Secondary | ICD-10-CM

## 2020-04-09 DIAGNOSIS — E782 Mixed hyperlipidemia: Secondary | ICD-10-CM | POA: Diagnosis not present

## 2020-04-09 NOTE — Patient Instructions (Addendum)
Visit Information  Goals Addressed            This Visit's Progress   . Learn More About My Health       Timeframe:  Long-Range Goal Priority:  High Start Date:    04/09/2020                         Expected End Date:   04/09/2021                     Follow Up Date 10/13/2020    - ask questions - repeat what I heard to make sure I understand - bring a list of my medicines to the visit - speak up when I don't understand    Why is this important?    The best way to learn about your health and care is by talking to the doctor and nurse.   They will answer your questions and give you information in the way that you like best.    Notes:     . Lifestyle Change-Hypertension       Timeframe:  Long-Range Goal Priority:  High Start Date:            04/09/2020                 Expected End Date:  04/09/2021                     Follow Up Date 10/13/2020    - ask questions to understand    Why is this important?    The changes that you are asked to make may be hard to do.   This is especially true when the changes are life-long.   Knowing why it is important to you is the first step.   Working on the change with your family or support person helps you not feel alone.   Reward yourself and family or support person when goals are met. This can be an activity you choose like bowling, hiking, biking, swimming or shooting hoops.     Notes:     Marland Kitchen Manage My Medicine       Timeframe:  Long-Range Goal Priority:  High Start Date:        04/09/2020                     Expected End Date:   04/09/2021                    Follow Up Date 10/13/2020    - call for medicine refill 2 or 3 days before it runs out - keep a list of all the medicines I take; vitamins and herbals too - use a pillbox to sort medicine    Why is this important?   . These steps will help you keep on track with your medicines.   Notes:       Patient Care Plan: General Pharmacy (Adult)    Problem  Identified: constipation, htn, hld   Priority: High  Onset Date: 04/09/2020    Long-Range Goal: diseaes management   Start Date: 04/09/2020  Expected End Date: 04/13/2021  This Visit's Progress: On track  Priority: High  Note:    Current Barriers:  . Unable to achieve control of constipation   Pharmacist Clinical Goal(s):  Marland Kitchen Over the next 90 days, patient will achieve adherence to monitoring guidelines and medication adherence  to achieve therapeutic efficacy . adhere to prescribed medication regimen as evidenced by fill history and pill box usage through collaboration with PharmD and provider.    Interventions: . 1:1 collaboration with Rochel Brome, MD regarding development and update of comprehensive plan of care as evidenced by provider attestation and co-signature . Inter-disciplinary care team collaboration (see longitudinal plan of care) . Comprehensive medication review performed; medication list updated in electronic medical record  Hypertension (BP goal <140/90) -controlled -Current treatment: . losartan 25 mg daily  -Medications previously tried: carvedilol, clonidine, edarbi, lisinopril, metoprolol, bystolic -Current home readings: states "good" -Current dietary habits: eatings vegetables and fruit -Current exercise habits: stays active but too weak to walk like she did previously -Denies hypotensive/hypertensive symptoms -Educated on BP goals and benefits of medications for prevention of heart attack, stroke and kidney damage; Daily salt intake goal < 2300 mg; Exercise goal of 150 minutes per week; -Counseled to monitor BP at home daily, document, and provide log at future appointments -Counseled on diet and exercise extensively Recommended to continue current medication  Hyperlipidemia: (LDL goal < 55) -uncontrolled -Current treatment: . simvastatin 20 mg daily bedtime  -Medications previously tried: none reported  -Current dietary patterns: vegetables and fruit  - can't tolerate meat well -Current exercise habits: stays active but not walking like before -Educated on Cholesterol goals;  Benefits of statin for ASCVD risk reduction; Importance of limiting foods high in cholesterol; Exercise goal of 150 minutes per week; -Counseled on diet and exercise extensively Recommended to continue current medication  Constipation (Goal: reduce stomach discomfort) -uncontrolled -Current treatment  . miralax daily  . Stool softener daily  -Medications previously tried: lactulose  -Counseled on diet and exercise extensively Counseled on fiber, hydration and activity to help with constipation.  Recommended adding ensure to diet for protein/nutrients since limited intake in diet wiht recent stomach flare. Discussed followin up with GI specialist to evaluate stomach pain and constipation again. Patient unable to tolerate lactulose due to sweet taste.    Patient Goals/Self-Care Activities . Over the next 90 days, patient will:  - take medications as prescribed focus on medication adherence by using pill box Consider seeing GI specialist for stomach pain and constipation.   Follow Up Plan: Telephone follow up appointment with care management team member scheduled for:09/2020      The patient verbalized understanding of instructions, educational materials, and care plan provided today and declined offer to receive copy of patient instructions, educational materials, and care plan.  Telephone follow up appointment with pharmacy team member scheduled for: 09/2020  Burnice Logan, [facility]  Constipation, Adult Constipation is when a person has trouble pooping (having a bowel movement). When you have this condition, you may poop fewer than 3 times a week. Your poop (stool) may also be dry, hard, or bigger than normal. Follow these instructions at home: Eating and drinking  Eat foods that have a lot of fiber, such as: ? Fresh fruits and vegetables. ? Whole  grains. ? Beans.  Eat less of foods that are low in fiber and high in fat and sugar, such as: ? [place] fries. ? Hamburgers. ? Cookies. ? Candy. ? Soda.  Drink enough fluid to keep your pee (urine) pale yellow.   General instructions  Exercise regularly or as told by your doctor. Try to do 150 minutes of exercise each week.  Go to the restroom when you feel like you need to poop. Do not hold it in.  Take over-the-counter and prescription  medicines only as told by your doctor. These include any fiber supplements.  When you poop: ? Do deep breathing while relaxing your lower belly (abdomen). ? Relax your pelvic floor. The pelvic floor is a group of muscles that support the rectum, bladder, and intestines (as well as the uterus in women).  Watch your condition for any changes. Tell your doctor if you notice any.  Keep all follow-up visits as told by your doctor. This is important. Contact a doctor if:  You have pain that gets worse.  You have a fever.  You have not pooped for 4 days.  You vomit.  You are not hungry.  You lose weight.  You are bleeding from the opening of the butt (anus).  You have thin, pencil-like poop. Get help right away if:  You have a fever, and your symptoms suddenly get worse.  You leak poop or have blood in your poop.  Your belly feels hard or bigger than normal (bloated).  You have very bad belly pain.  You feel dizzy or you faint. Summary  Constipation is when a person poops fewer than 3 times a week, has trouble pooping, or has poop that is dry, hard, or bigger than normal.  Eat foods that have a lot of fiber.  Drink enough fluid to keep your pee (urine) pale yellow.  Take over-the-counter and prescription medicines only as told by your doctor. These include any fiber supplements. This information is not intended to replace advice given to you by your health care provider. Make sure you discuss any questions you have with your  health care provider. Document Revised: 12/25/2018 Document Reviewed: 12/25/2018 Elsevier Patient Education  [place].

## 2020-04-09 NOTE — Progress Notes (Signed)
Chronic Care Management Pharmacy Note  04/13/2020 Name:  Leah Pittman MRN:  [id number] DOB:  06/25/1930  Subjective: Leah Pittman is an 87 y.o. year old female who is a primary patient of [name], [name], MD.  The CCM team was consulted for assistance with disease management and care coordination needs.    Engaged with patient by telephone for follow up visit in response to provider referral for pharmacy case management and/or care coordination services.   Consent to Services:  The patient was given information about Chronic Care Management services, agreed to services, and gave verbal consent prior to initiation of services.  Please see initial visit note for detailed documentation.   Patient Care Team: Rochel Brome, MD as PCP - General (Family Medicine) Sanda Klein, MD as PCP - Cardiology (Cardiology) Burnice Logan, [facility] as Pharmacist (Pharmacist) Burnice Logan, [facility] as Pharmacist (Pharmacist)  Recent office visits: 12/22/2019 - no source of muscle/back pain detected in labs.   Recent consult visits: 03/03/2020 - Opthamology - start Travatan Z, Stop latanoprost. Artificial tears prn. Dorzolamide-timolol.  01/28/2020 - cardiology - stop ranolazine. Consider PCSK9 at next labs. Echo ordered 01/09/2020 - GI - lactulose 1 tablespoonful daily. Continue pantoprazole 40 mg daily. Zenpep with meals.   Hospital visits: 10/2019 [facility] admission for chest pain/hypertensive crisis   Objective:  Lab Results  Component Value Date   CREATININE 0.86 01/27/2020   BUN 16 01/27/2020   GFR 60.94 01/27/2020   GFRNONAA 55 (L) 12/22/2019   GFRAA 64 12/22/2019   NA 136 01/27/2020   K 4.1 01/27/2020   CALCIUM 10.0 01/27/2020   CO2 28 01/27/2020    Lab Results  Component Value Date/Time   HGBA1C 5.8 (H) 07/10/2018 04:21 AM   GFR 60.94 01/27/2020 04:11 PM    Last diabetic Eye exam: No results found for: HMDIABEYEEXA  Last diabetic Foot exam: No results found for: HMDIABFOOTEX    Lab Results  Component Value Date   CHOL 206 (H) 11/04/2019   HDL 69 11/04/2019   LDLCALC 120 (H) 11/04/2019   TRIG 86 11/04/2019   CHOLHDL 3.0 11/04/2019    Hepatic Function Latest Ref Rng & Units 01/27/2020 12/22/2019 11/05/2019  Total Protein 6.0 - 8.3 g/dL 6.8 6.8 5.7(L)  Albumin 3.5 - 5.2 g/dL 4.0 4.3 3.3(L)  AST 0 - 37 U/L $Remo'14 10 18  'feayV$ ALT 0 - 35 U/L $Remo'15 14 18  'nUbMp$ Alk Phosphatase 39 - 117 U/L 61 74 45  Total Bilirubin 0.2 - 1.2 mg/dL 0.3 0.3 0.6  Bilirubin, Direct 0.0 - 0.2 mg/dL - - -    Lab Results  Component Value Date/Time   TSH 0.879 12/22/2019 11:40 AM   TSH 6.815 (H) 11/03/2019 03:28 PM   TSH 3.870 08/20/2019 12:02 PM   FREET4 1.14 (H) 11/04/2019 06:17 AM    CBC Latest Ref Rng & Units 01/27/2020 12/22/2019 11/07/2019  WBC 4.0 - 10.5 K/uL 8.2 7.9 7.5  Hemoglobin 12.0 - 15.0 g/dL 12.7 12.4 12.4  Hematocrit 36.0 - 46.0 % 37.3 36.6 37.5  Platelets 150.0 - 400.0 K/uL 255.0 231 225    No results found for: VD25OH  Clinical ASCVD: Yes  The ASCVD Risk score Mikey Bussing DC Jr., et al., 2013) failed to calculate for the following reasons:   The 2013 ASCVD risk score is only valid for ages 58 to 59   The patient has a prior MI or stroke diagnosis    Depression screen [facility] 2/9 12/22/2019 09/11/2018  Decreased Interest  0 0  Down, Depressed, Hopeless 0 0  PHQ - 2 Score 0 0     Other: (CHADS2VASc if Afib, MMRC or CAT for COPD, ACT, DEXA)  Social History   Tobacco Use  Smoking Status Never Smoker  Smokeless Tobacco Never Used   BP Readings from Last 3 Encounters:  01/28/20 128/76  01/09/20 130/76  12/22/19 (!) 124/56   Pulse Readings from Last 3 Encounters:  01/28/20 73  01/09/20 63  12/22/19 60   Wt Readings from Last 3 Encounters:  01/28/20 119 lb (54 kg)  01/09/20 120 lb 8 oz (54.7 kg)  12/22/19 118 lb 12.8 oz (53.9 kg)    Assessment/Interventions: Review of patient past medical history, allergies, medications, health status, including review of consultants  reports, laboratory and other test data, was performed as part of comprehensive evaluation and provision of chronic care management services.   SDOH:  (Social Determinants of Health) assessments and interventions performed: Yes   CCM Care Plan  Allergies  Allergen Reactions  . Benadryl [Diphenhydramine] Swelling and Other (See Comments)    Tongue swells and hallucinates- could not talk  . Zenpep [Pancrelipase (Lip-Prot-Amyl)] Rash  . Codeine Nausea And Vomiting and Hypertension  . Meperidine Nausea Only, Swelling and Other (See Comments)    Tongue swells and "I feel like death"  . Prednisone     Hypertension   . Promethazine Other (See Comments)    Hypotension   . Propranolol Nausea And Vomiting  . Shellfish Allergy Nausea And Vomiting  . Tape Other (See Comments)    Tears the skin  . Tazarotene Other (See Comments)    Reaction not recalled   . Iodinated Diagnostic Agents Nausea Only and Rash    Medications Reviewed Today    Reviewed by Burnice Logan, [facility] (Pharmacist) on 04/09/20 at 1133  Med List Status: <None>  Medication Order Taking? Sig Documenting Provider Last Dose Status Informant  acetaminophen (TYLENOL) 325 MG tablet [id number] Yes Take 975 mg by mouth every 8 (eight) hours as needed for mild pain or headache.  [provider] Taking Active Self  aspirin 81 MG tablet [id number] Yes Take 81 mg by mouth daily. [provider] Taking Active Self  Calcium Citrate-Vitamin D (CALCIUM CITRATE+D3 PETITES PO) [id number] Yes Take 1 tablet by mouth daily. [provider] Taking Active Self  docusate sodium (COLACE) 100 MG capsule [id number] Yes Take 100 mg by mouth daily as needed for mild constipation. [provider] Taking Active   dorzolamide-timolol (COSOPT) 22.3-6.8 MG/ML ophthalmic solution [id number] Yes Place 1 drop into the left eye 2 (two) times daily.  [provider] Taking Active Self  ENULOSE 10 GM/15ML SOLN [id number] No Take  by mouth.  Patient not taking: Reported on 04/09/2020   [provider] Not Taking Active   lactulose (CHRONULAC) 10 GM/15ML solution [id number] No Take 15 mLs (10 g total) by mouth daily.  Patient not taking: Reported on 04/09/2020   Jackquline Denmark, MD Not Taking Active   latanoprost (XALATAN) 0.005 % ophthalmic solution [id number] Yes Place 1 drop into both eyes at bedtime.  [provider] Taking Active Self  levothyroxine (SYNTHROID) 100 MCG tablet [id number] Yes Take 1 tablet (100 mcg total) by mouth daily before breakfast. [name], [name], MD Taking Active   losartan (COZAAR) 100 MG tablet [id number] Yes Take 1 tablet (100 mg total) by mouth daily.  Patient taking differently: Take 25 mg by mouth daily. Patient reports taking 25 mg daily.   Cruzita Lederer,  Vella Redhead, MD Taking Active   magnesium oxide (MAG-OX) 400 MG tablet [id number] Yes Take 400 mg by mouth daily. Chewable [provider] Taking Active Self  nitroGLYCERIN (NITROSTAT) 0.4 MG SL tablet [id number] Yes Place 0.4 mg under the tongue every 5 (five) minutes as needed for chest pain.  [provider] Taking Active Self           Med Note [place] Jul 24, 2019  7:58 PM)    ondansetron (ZOFRAN) 8 MG tablet [id number] Yes TAKE 1 TABLET (8 MG TOTAL) BY MOUTH 2 (TWO) TIMES DAILY AS NEEDED FOR NAUSEA OR VOMITING. [name], [name], MD Taking Active   Pancrelipase, Lip-Prot-Amyl, (ZENPEP PO) [id number] Yes Take 1 tablet by mouth daily. Take 30 minutes before your breakfast [provider] Taking Active   pantoprazole (PROTONIX) 40 MG tablet [id number] Yes TAKE 1 TABLET BY MOUTH EVERY DAY [name], [name], MD Taking Active   pregabalin (LYRICA) 25 MG capsule [id number] No Take 25 mg by mouth in the morning, at noon, and at bedtime.  Patient not taking: Reported on 04/09/2020   [provider] Not Taking Active   Probiotic Product (PROBIOTIC-10 PO) [id number] Yes Take 1 tablet by mouth daily in the  afternoon. [provider] Taking Active   simvastatin (ZOCOR) 20 MG tablet [id number] Yes TAKE 1 TABLET BY MOUTH EVERYDAY AT BEDTIME [name], [name], MD Taking Active   triamcinolone cream (KENALOG) 0.1 % [id number] Yes SMARTSIG:1 Application Topical 2-3 Times Daily [provider] Taking Active   Vitamin D, Cholecalciferol, 50 MCG (2000 UT) CAPS [id number] Yes Take 1 tablet by mouth daily. [provider] Taking Active Self          Patient Active Problem List   Diagnosis Date Noted  . Unstable gait 12/22/2019  . Paresthesia of both feet 12/22/2019  . Bruising 12/22/2019  . Hypothyroidism   . Pancreatic duct dilated   . Descending thoracic aortic dissection ([place])   . Elevated blood pressure reading   . Acute cystitis with hematuria 08/20/2019  . Hoarseness of voice 08/20/2019  . Medication management 08/20/2019  . SOB (shortness of breath) 08/20/2019  . Irritable bowel syndrome with constipation 08/20/2019  . Chronic obstructive pulmonary disease ([place]) 08/20/2019  . Abdominal pain, chronic, epigastric 08/20/2019  . Chest pain 07/24/2019  . Hypertensive crisis 07/24/2019  . Neck pain 07/24/2019  . S/P CABG x 4 07/10/2018  . Coronary artery disease involving native coronary artery of native heart without angina pectoris 07/08/2018  . Essential hypertension 08/14/2017  . Hypercholesterolemia 11/30/2015  . Precordial chest pain 01/13/2015  . Chest pain with high risk for cardiac etiology 12/12/2013  . Glaucoma 09/29/2012  . Anxiety   . Labile hypertension   . Migraine headache     Immunization History  Administered Date(s) Administered  . Tdap 09/25/2014    Conditions to be addressed/monitored:  Hypertension, Hyperlipidemia, Coronary Artery Disease, COPD and IBS-C.   Care Plan : General Pharmacy (Adult)  Updates made by Burnice Logan, RPH since 04/13/2020 12:00 AM    Problem: constipation, htn, hld   Priority: High  Onset Date: 04/09/2020     Long-Range Goal: diseaes management   Start Date: 04/09/2020  Expected End Date: 04/13/2021  This Visit's Progress: On track  Priority: High  Note:    Current Barriers:  . Unable to achieve control of constipation   Pharmacist Clinical Goal(s):  Marland Kitchen Over the next 90 days, patient will achieve adherence to  monitoring guidelines and medication adherence to achieve therapeutic efficacy . adhere to prescribed medication regimen as evidenced by fill history and pill box usage through collaboration with PharmD and provider.    Interventions: . 1:1 collaboration with Rochel Brome, MD regarding development and update of comprehensive plan of care as evidenced by provider attestation and co-signature . Inter-disciplinary care team collaboration (see longitudinal plan of care) . Comprehensive medication review performed; medication list updated in electronic medical record  Hypertension (BP goal <140/90) -controlled -Current treatment: . losartan 25 mg daily  -Medications previously tried: carvedilol, clonidine, edarbi, lisinopril, metoprolol, bystolic -Current home readings: states "good" -Current dietary habits: eatings vegetables and fruit -Current exercise habits: stays active but too weak to walk like she did previously -Denies hypotensive/hypertensive symptoms -Educated on BP goals and benefits of medications for prevention of heart attack, stroke and kidney damage; Daily salt intake goal < 2300 mg; Exercise goal of 150 minutes per week; -Counseled to monitor BP at home daily, document, and provide log at future appointments -Counseled on diet and exercise extensively Recommended to continue current medication  Hyperlipidemia: (LDL goal < 55) -uncontrolled -Current treatment: . simvastatin 20 mg daily bedtime  -Medications previously tried: none reported  -Current dietary patterns: vegetables and fruit - can't tolerate meat well -Current exercise habits: stays active but not  walking like before -Educated on Cholesterol goals;  Benefits of statin for ASCVD risk reduction; Importance of limiting foods high in cholesterol; Exercise goal of 150 minutes per week; -Counseled on diet and exercise extensively Recommended to continue current medication  Constipation (Goal: reduce stomach discomfort) -uncontrolled -Current treatment  . miralax daily  . Stool softener daily  -Medications previously tried: lactulose  -Counseled on diet and exercise extensively Counseled on fiber, hydration and activity to help with constipation.  Recommended adding ensure to diet for protein/nutrients since limited intake in diet wiht recent stomach flare. Discussed followin up with GI specialist to evaluate stomach pain and constipation again. Patient unable to tolerate lactulose due to sweet taste.    Patient Goals/Self-Care Activities . Over the next 90 days, patient will:  - take medications as prescribed focus on medication adherence by using pill box Consider seeing GI specialist for stomach pain and constipation.   Follow Up Plan: Telephone follow up appointment with care management team member scheduled for:09/2020      Medication Assistance: None required.  Patient affirms current coverage meets needs.  Patient's preferred pharmacy is:  [id number] - [place], [place] [address] [address] Phone: [phone] Fax: [phone]  [facility] - [place], [place] - [address] [place] [address] Phone: [phone] Fax: [phone]  Uses pill box? Yes Pt endorses good compliance  We discussed: Benefits of medication synchronization, packaging and delivery as well as enhanced pharmacist oversight with [facility]. Patient decided to: Continue current medication management strategy  Follow Up:  Patient agrees to Care Plan and Follow-up.  Plan:  Telephone follow up appointment with care management team member scheduled for:  09/2020

## 2020-05-06 ENCOUNTER — Ambulatory Visit (INDEPENDENT_AMBULATORY_CARE_PROVIDER_SITE_OTHER): Payer: Medicare Other | Admitting: Physician Assistant

## 2020-05-06 ENCOUNTER — Telehealth: Payer: Self-pay

## 2020-05-06 ENCOUNTER — Other Ambulatory Visit: Payer: Self-pay

## 2020-05-06 ENCOUNTER — Encounter: Payer: Self-pay | Admitting: Physician Assistant

## 2020-05-06 VITALS — BP 196/70 | HR 70 | Ht 64.0 in | Wt 124.8 lb

## 2020-05-06 DIAGNOSIS — I10 Essential (primary) hypertension: Secondary | ICD-10-CM

## 2020-05-06 DIAGNOSIS — I25810 Atherosclerosis of coronary artery bypass graft(s) without angina pectoris: Secondary | ICD-10-CM | POA: Diagnosis not present

## 2020-05-06 DIAGNOSIS — E785 Hyperlipidemia, unspecified: Secondary | ICD-10-CM

## 2020-05-06 DIAGNOSIS — Z01812 Encounter for preprocedural laboratory examination: Secondary | ICD-10-CM | POA: Diagnosis not present

## 2020-05-06 DIAGNOSIS — R059 Cough, unspecified: Secondary | ICD-10-CM

## 2020-05-06 DIAGNOSIS — I480 Paroxysmal atrial fibrillation: Secondary | ICD-10-CM

## 2020-05-06 DIAGNOSIS — R0602 Shortness of breath: Secondary | ICD-10-CM | POA: Diagnosis not present

## 2020-05-06 NOTE — Progress Notes (Signed)
Cardiology Office Note:    Date:  05/08/2020   ID:  Leah Pittman, DOB [birth year], MRN [id number]  PCP:  Blane Ohara, MD   [facility]  Cardiologist:  Thurmon Fair, MD  Advanced Practice Provider:  No care team member to display Electrophysiologist:  None [phone]   Referring MD: Blane Ohara, MD   Chief Complaint  Patient presents with  . Follow-up    Seen for Dr. Royann Shivers    History of Present Illness:    Leah Pittman is a 87 y.o. female with a hx of IBS-C, hypertension, hyperlipidemia, CAD s/p CABG and postop atrial fibrillation.  Renal artery duplex in 2013 did not show any evidence of renal artery stenosis.  Patient underwent CABG with LIMA to LAD, SVG to diagonal, sequential SVG to OM1-OM 2 by Dr. Laneta Simmers on 07/10/2018.  Carotid Doppler prior to bypass surgery showed no evidence of significant carotid disease.  Echocardiogram obtained on 07/25/2019 showed EF 50 to 55%, inferior basal hypokinesis, trivial MR. Patient was previously seen by Dr. Royann Shivers on 01/28/2020 at which time she described multiple somatic symptom and pain all over.  There was no obvious EKG changes.  Her simvastatin was discontinued.  It was recommended for her to have a repeat echocardiogram, this was never done.  Patient presents today for evaluation of dyspnea.  She also has persistent chest pain that is worse with palpation.  She says for the past 1 year, she has had been having increasing productive cough.  Previous CT angiogram of the chest obtained in September 2021 showed dilated main pancreatic duct which may represent intraductal papillary mucinous neoplasm, bilateral pulmonary nodules.  She has been seen by GI service for evaluation of her pancreas.  Her PCP has prescribed pancrelipase which she says she could not tolerate.  She continued to deal with significant constipation issues.  She says her shortness of breath is getting worse.  On exam, she has bibasilar crackles, I  recommended a chest CT.  Unfortunately she says echocardiogram was never arranged, she is interested to have a repeat echocardiogram to see if there is any cardiac causes for her symptoms.  She also did not do the 11-month trial off of Zocor to see if it could be the medication that is causing her symptoms.  She is agreeable to start doing that 74-month holiday off of statin starting now.  Blood pressure in the office was quite high at 196/70.  However she says she did not take her blood pressure medication this morning.  She does not take her losartan consistently, she only takes it when her blood pressure is high, she says she roughly take it about 3-4 times a week.  Sometimes she mentions her blood pressure gets too low in the 110s range and she is afraid to take the blood pressure medication.  I asked her to take 50 mg of losartan for now and recheck her blood pressure in a few hours and if her blood pressure still high, may consider take another 50 mg of losartan.  Past Medical History:  Diagnosis Date  . Anxiety   . Arthritis   . Chest pain 2011   CARDIOLITE - no significant symptoms, EKG changes, or arrhythmias  . Congenital prolapse of bladder mucosa   . Diverticulosis   . Dyspnea 02/16/2012   2D ECHO - EF 55-60%, normal  . Esophageal dysphagia   . Fatigue   . GERD (gastroesophageal reflux disease)   . Glaucoma   .  Headache(784.0)   . Heart attack (HCC)   . Hiatal hernia   . High blood pressure 02/16/2012   RENAL DOPPLER - normal  . Hyperlipidemia   . Hypothyroidism   . Labile hypertension   . Lightheadedness   . Migraine headache   . Multiple allergies   . Myalgia   . OSA (obstructive sleep apnea)   . Pain, lower leg    Calf  . Problem of menstruation   . Proteinuria   . Reflux   . SOB (shortness of breath)   . Thyroid disease   . Urinary problem   . Valvular heart disease   . Vitamin B12 deficiency   . Vitamin D deficiency     Past Surgical History:  Procedure  Laterality Date  . ABDOMINAL HYSTERECTOMY  1976  . CHOLECYSTECTOMY  2000  . COLONOSCOPY  07/27/2014   Moderate predominantly sigmoid divertuculosis. Otherwise normal colonoscopy  . CORONARY ARTERY BYPASS GRAFT N/A 07/10/2018   Procedure: CORONARY ARTERY BYPASS GRAFTING (CABG), FREE LIMA;  Surgeon: Alleen BorneBartle, Bryan K, MD;  Location: [facility] OR;  Service: Open Heart Surgery;  Laterality: N/A;  . CYST REMOVAL NECK    . CYSTECTOMY  1974   Intestine  . CYSTECTOMY  1996   Brain stem  . ESOPHAGOGASTRODUODENOSCOPY  12/07/2016   Schatzki's ring status post esophageal dilatation. Small hiatal hernia. Mild gastritis  . EYE SURGERY  2003  . EYE SURGERY  07/2016  . LEFT HEART CATH AND CORONARY ANGIOGRAPHY N/A 07/08/2018   Procedure: LEFT HEART CATH AND CORONARY ANGIOGRAPHY;  Surgeon: Runell GessBerry, Jonathan J, MD;  Location: [facility] INVASIVE CV LAB;  Service: Cardiovascular;  Laterality: N/A;  . LEFT HEART CATH AND CORONARY ANGIOGRAPHY N/A 07/25/2019   Procedure: LEFT HEART CATH AND CORONARY ANGIOGRAPHY;  Surgeon: Kathleene HazelMcAlhany, Christopher D, MD;  Location: [facility] INVASIVE CV LAB;  Service: Cardiovascular;  Laterality: N/A;  . MOUTH SURGERY  2013  . RADIOLOGY WITH ANESTHESIA N/A 11/05/2019   Procedure: MRI WITH ANESTHESIA;  Surgeon: Radiologist, Medication, MD;  Location: [facility] OR;  Service: Radiology;  Laterality: N/A;  . TEE WITHOUT CARDIOVERSION N/A 07/10/2018   Procedure: TRANSESOPHAGEAL ECHOCARDIOGRAM (TEE);  Surgeon: Alleen BorneBartle, Bryan K, MD;  [facility];  Service: Open Heart Surgery;  Laterality: N/A;  . TONSILLECTOMY     age 89    Current Medications: Current Meds  Medication Sig  . acetaminophen (TYLENOL) 325 MG tablet Take 975 mg by mouth every 8 (eight) hours as needed for mild pain or headache.   Marland Kitchen. aspirin 81 MG tablet Take 81 mg by mouth daily.  . Calcium Citrate-Vitamin D (CALCIUM CITRATE+D3 PETITES PO) Take 1 tablet by mouth daily.  Marland Kitchen. docusate sodium (COLACE) 100 MG capsule Take 100 mg by mouth daily as needed for  mild constipation.  . dorzolamide-timolol (COSOPT) 22.3-6.8 MG/ML ophthalmic solution Place 1 drop into the left eye 2 (two) times daily.   Marland Kitchen. latanoprost (XALATAN) 0.005 % ophthalmic solution Place 1 drop into both eyes at bedtime.   Marland Kitchen. levothyroxine (SYNTHROID) 100 MCG tablet Take 1 tablet (100 mcg total) by mouth daily before breakfast.  . losartan (COZAAR) 100 MG tablet Take 1 tablet (100 mg total) by mouth daily. (Patient taking differently: Take 25 mg by mouth daily. Patient reports taking 25 mg daily.)  . magnesium oxide (MAG-OX) 400 MG tablet Take 400 mg by mouth daily. Chewable  . nitroGLYCERIN (NITROSTAT) 0.4 MG SL tablet Place 0.4 mg under the tongue every 5 (five) minutes as needed for chest pain.   .Marland Kitchen  ondansetron (ZOFRAN) 8 MG tablet TAKE 1 TABLET (8 MG TOTAL) BY MOUTH 2 (TWO) TIMES DAILY AS NEEDED FOR NAUSEA OR VOMITING.  . Pancrelipase, Lip-Prot-Amyl, (ZENPEP PO) Take 1 tablet by mouth daily. Take 30 minutes before your breakfast  . pantoprazole (PROTONIX) 40 MG tablet TAKE 1 TABLET BY MOUTH EVERY DAY  . potassium chloride SA (KLOR-CON) 20 MEQ tablet Take 20 mEq by mouth daily.  . Probiotic Product (PROBIOTIC-10 PO) Take 1 tablet by mouth daily in the afternoon.  Bernadette Hoit Sodium (SENNA PLUS PO) Take 1 tablet by mouth as directed.  . simvastatin (ZOCOR) 20 MG tablet TAKE 1 TABLET BY MOUTH EVERYDAY AT BEDTIME  . Vitamin D, Cholecalciferol, 50 MCG (2000 UT) CAPS Take 1 tablet by mouth daily.     Allergies:   Benadryl [diphenhydramine], Zenpep [pancrelipase (lip-prot-amyl)], Codeine, Meperidine, Prednisone, Promethazine, Propranolol, Shellfish allergy, Tape, Tazarotene, and Iodinated diagnostic agents   Social History   Socioeconomic History  . Marital status: Married    Spouse name: Not on file  . Number of children: 3  . Years of education: college  . Highest education level: Not on file  Occupational History  . Occupation: Retired  Tobacco Use  . Smoking status:  Never Smoker  . Smokeless tobacco: Never Used  Vaping Use  . Vaping Use: Never used  Substance and Sexual Activity  . Alcohol use: No  . Drug use: No  . Sexual activity: Not on file  Other Topics Concern  . Not on file  Social History Narrative   Lives with husband in [place], [place]   Social Determinants of Health   Financial Resource Strain: Not on file  Food Insecurity: No Food Insecurity  . Worried About Programme researcher, broadcasting/film/video in the Last Year: Never true  . Ran Out of Food in the Last Year: Never true  Transportation Needs: Not on file  Physical Activity: Not on file  Stress: Not on file  Social Connections: Not on file     Family History: The patient's family history includes Asthma in her brother; Cancer in her brother, brother, and sister; Heart attack in her father; Stroke in her father.  ROS:   Please see the history of present illness.     All other systems reviewed and are negative.  EKGs/Labs/Other Studies Reviewed:    The following studies were reviewed today:  Echo 07/25/2019 1. Abnormal septal motion inferior basal hypokinesis . Left ventricular  ejection fraction, by estimation, is 50 to 55%. The left ventricle has low  normal function. The left ventricle demonstrates regional wall motion  abnormalities (see scoring  diagram/findings for description). Left ventricular diastolic parameters  are consistent with age-related delayed relaxation (normal).  2. Right ventricular systolic function is normal. The right ventricular  size is normal.  3. The mitral valve is normal in structure. Trivial mitral valve  regurgitation. No evidence of mitral stenosis.  4. The aortic valve is tricuspid. Aortic valve regurgitation is trivial.  Mild aortic valve sclerosis is present, with no evidence of aortic valve  stenosis.  5. The inferior vena cava is normal in size with greater than 50%  respiratory variability, suggesting right atrial pressure of 3 mmHg.  EKG:  EKG  is not ordered today.   Recent Labs: 11/03/2019: B Natriuretic Peptide 149.8 12/22/2019: Magnesium 2.2; TSH 0.879 01/27/2020: ALT 15; BUN 16; Creatinine, Ser 0.86; Hemoglobin 12.7; Platelets 255.0; Potassium 4.1; Sodium 136  Recent Lipid Panel    Component Value Date/Time   CHOL  206 (H) 11/04/2019 0617   CHOL 170 09/23/2018 1412   TRIG 86 11/04/2019 0617   HDL 69 11/04/2019 0617   HDL 64 09/23/2018 1412   CHOLHDL 3.0 11/04/2019 0617   VLDL 17 11/04/2019 0617   LDLCALC 120 (H) 11/04/2019 0617   LDLCALC 69 09/23/2018 1412     Risk Assessment/Calculations:       Physical Exam:    VS:  BP (!) 196/70   Pulse 70   Ht  (1.626 m)   Wt 124 lb 12.8 oz (56.6 kg)   SpO2 97%   BMI 21.42 kg/m     Wt Readings from Last 3 Encounters:  05/06/20 124 lb 12.8 oz (56.6 kg)  01/28/20 119 lb (54 kg)  01/09/20 120 lb 8 oz (54.7 kg)     GEN:  Well nourished, well developed in no acute distress HEENT: Normal NECK: No JVD; No carotid bruits LYMPHATICS: No lymphadenopathy CARDIAC: RRR, no murmurs, rubs, gallops RESPIRATORY:  Clear to auscultation without rales, wheezing or rhonchi  ABDOMEN: Soft, non-tender, non-distended MUSCULOSKELETAL:  No edema; No deformity  SKIN: Warm and dry NEUROLOGIC:  Alert and oriented x 3 PSYCHIATRIC:  Normal affect   ASSESSMENT:    1. SOB (shortness of breath)   2. Cough   3. Pre-procedure lab exam   4. Coronary artery disease involving coronary bypass graft of native heart without angina pectoris   5. Primary hypertension   6. Hyperlipidemia LDL goal <70   7. PAF (paroxysmal atrial fibrillation) (HCC)    PLAN:    In order of problems listed above:  1. Shortness of breath: Patient says she has been coughing for the past year.  I recommended CT of the chest.  I also recommended echocardiogram to rule out cardiac cause  2. Coughing: It is unusual that her symptom has persisted for the past year.  On physical exam, she has bibasilar crackles, I  recommended a CT of the chest  3. CAD s/p CABG: She does have intermittent chest discomfort, however this is only worse with palpation.  Her chest pain is fairly atypical  4. Hypertension: Blood pressure is quite elevated today, but the patient did not take her medications prior to coming to the clinic.  She will need to resume her losartan  5. Hyperlipidemia: On Zocor.  Dr. Royann Shivers previously recommended to hold Zocor for 2 months as a trial to see if her generalized pain will improve.  6. PAF: Postop atrial fibrillation only.  No recurrence.  Not on anticoagulation therapy.        Medication Adjustments/Labs and Tests Ordered: Current medicines are reviewed at length with the patient today.  Concerns regarding medicines are outlined above.  Orders Placed This Encounter  Procedures  . CT Chest Wo Contrast  . ECHOCARDIOGRAM COMPLETE   No orders of the defined types were placed in this encounter.   Patient Instructions  Medication Instructions:   HOLD Zocor for 2 months   *If you need a refill on your cardiac medications before your next appointment, please call your pharmacy*  Lab Work: NONE ordered at this time of appointment   If you have labs (blood work) drawn today and your tests are completely normal, you will receive your results only by: Marland Kitchen MyChart Message (if you have MyChart) OR . A paper copy in the mail If you have any lab test that is abnormal or we need to change your treatment, we will call you to review the results.  Testing/Procedures: Your physician has requested that you have an echocardiogram. Echocardiography is a painless test that uses sound waves to create images of your heart. It provides your doctor with information about the size and shape of your heart and how well your heart's chambers and valves are working. This procedure takes approximately one hour. There are no restrictions for this procedure. This test is performed at [address], [place], [place] [address]   Please schedule for 1-2 weeks   Non-Cardiac CT scanning, (CAT scanning), is a noninvasive, special x-ray that produces cross-sectional images of the body using x-rays and a computer. CT scans help physicians diagnose and treat medical conditions. For some CT exams, a contrast material is used to enhance visibility in the area of the body being studied. CT scans provide greater clarity and reveal more details than regular x-ray exams.    Follow-Up: At [facility], you and your health needs are our priority.  As part of our continuing mission to provide you with exceptional heart care, we have created designated Provider Care Teams.  These Care Teams include your primary Cardiologist (physician) and Advanced Practice Providers (APPs -  Physician Assistants and Nurse Practitioners) who all work together to provide you with the care you need, when you need it.  We recommend signing up for the patient portal called "MyChart".  Sign up information is provided on this After Visit Summary.  MyChart is used to connect with patients for Virtual Visits (Telemedicine).  Patients are able to view lab/test results, encounter notes, upcoming appointments, etc.  Non-urgent messages can be sent to your provider as well.   To learn more about what you can do with MyChart, go to [email or web address].    Your next appointment:   Follow up after Echocardiogram    The format for your next appointment:   In Person  Provider:   Azalee Course, PA-C  Other Instructions      Signed, Azalee Course, PA  05/08/2020 11:49 PM    [facility]

## 2020-05-06 NOTE — Patient Instructions (Addendum)
Medication Instructions:   HOLD Zocor for 2 months   *If you need a refill on your cardiac medications before your next appointment, please call your pharmacy*  Lab Work: NONE ordered at this time of appointment   If you have labs (blood work) drawn today and your tests are completely normal, you will receive your results only by: Marland Kitchen MyChart Message (if you have MyChart) OR . A paper copy in the mail If you have any lab test that is abnormal or we need to change your treatment, we will call you to review the results.  Testing/Procedures: Your physician has requested that you have an echocardiogram. Echocardiography is a painless test that uses sound waves to create images of your heart. It provides your doctor with information about the size and shape of your heart and how well your heart's chambers and valves are working. This procedure takes approximately one hour. There are no restrictions for this procedure. This test is performed at [address], [place], [place] [address]   Please schedule for 1-2 weeks   Non-Cardiac CT scanning, (CAT scanning), is a noninvasive, special x-ray that produces cross-sectional images of the body using x-rays and a computer. CT scans help physicians diagnose and treat medical conditions. For some CT exams, a contrast material is used to enhance visibility in the area of the body being studied. CT scans provide greater clarity and reveal more details than regular x-ray exams.    Follow-Up: At [facility], you and your health needs are our priority.  As part of our continuing mission to provide you with exceptional heart care, we have created designated Provider Care Teams.  These Care Teams include your primary Cardiologist (physician) and Advanced Practice Providers (APPs -  Physician Assistants and Nurse Practitioners) who all work together to provide you with the care you need, when you need it.  We recommend signing up for the patient portal  called "MyChart".  Sign up information is provided on this After Visit Summary.  MyChart is used to connect with patients for Virtual Visits (Telemedicine).  Patients are able to view lab/test results, encounter notes, upcoming appointments, etc.  Non-urgent messages can be sent to your provider as well.   To learn more about what you can do with MyChart, go to [email or web address].    Your next appointment:   Follow up after Echocardiogram    The format for your next appointment:   In Person  Provider:   Azalee Course, PA-C  Other Instructions

## 2020-05-06 NOTE — Progress Notes (Signed)
Chronic Care Management Pharmacy Assistant   Name: MALYNDA SMOLINSKI  MRN: [id number] DOB: 10/13/1930   Reason for Encounter: Disease State for hypertension    Recent office visits:  04/09/20- Lucia Gaskins, CPP  Recent consult visits:  none  Hospital visits:  11/03/19-ED    Precordial pain Medications: Outpatient Encounter Medications as of 05/06/2020  Medication Sig  . acetaminophen (TYLENOL) 325 MG tablet Take 975 mg by mouth every 8 (eight) hours as needed for mild pain or headache.   Marland Kitchen aspirin 81 MG tablet Take 81 mg by mouth daily.  . Calcium Citrate-Vitamin D (CALCIUM CITRATE+D3 PETITES PO) Take 1 tablet by mouth daily.  Marland Kitchen docusate sodium (COLACE) 100 MG capsule Take 100 mg by mouth daily as needed for mild constipation.  . dorzolamide-timolol (COSOPT) 22.3-6.8 MG/ML ophthalmic solution Place 1 drop into the left eye 2 (two) times daily.   Marland Kitchen ENULOSE 10 GM/15ML SOLN Take by mouth. (Patient not taking: Reported on 04/09/2020)  . lactulose (CHRONULAC) 10 GM/15ML solution Take 15 mLs (10 g total) by mouth daily. (Patient not taking: Reported on 04/09/2020)  . latanoprost (XALATAN) 0.005 % ophthalmic solution Place 1 drop into both eyes at bedtime.   Marland Kitchen levothyroxine (SYNTHROID) 100 MCG tablet Take 1 tablet (100 mcg total) by mouth daily before breakfast.  . losartan (COZAAR) 100 MG tablet Take 1 tablet (100 mg total) by mouth daily. (Patient taking differently: Take 25 mg by mouth daily. Patient reports taking 25 mg daily.)  . magnesium oxide (MAG-OX) 400 MG tablet Take 400 mg by mouth daily. Chewable  . nitroGLYCERIN (NITROSTAT) 0.4 MG SL tablet Place 0.4 mg under the tongue every 5 (five) minutes as needed for chest pain.   Marland Kitchen ondansetron (ZOFRAN) 8 MG tablet TAKE 1 TABLET (8 MG TOTAL) BY MOUTH 2 (TWO) TIMES DAILY AS NEEDED FOR NAUSEA OR VOMITING.  . Pancrelipase, Lip-Prot-Amyl, (ZENPEP PO) Take 1 tablet by mouth daily. Take 30 minutes before your breakfast  . pantoprazole (PROTONIX)  40 MG tablet TAKE 1 TABLET BY MOUTH EVERY DAY  . pregabalin (LYRICA) 25 MG capsule Take 25 mg by mouth in the morning, at noon, and at bedtime. (Patient not taking: Reported on 04/09/2020)  . Probiotic Product (PROBIOTIC-10 PO) Take 1 tablet by mouth daily in the afternoon.  . simvastatin (ZOCOR) 20 MG tablet TAKE 1 TABLET BY MOUTH EVERYDAY AT BEDTIME  . triamcinolone cream (KENALOG) 0.1 % SMARTSIG:1 Application Topical 2-3 Times Daily  . Vitamin D, Cholecalciferol, 50 MCG (2000 UT) CAPS Take 1 tablet by mouth daily.   No facility-administered encounter medications on file as of 05/06/2020.    Reviewed chart prior to disease state call. Spoke with patient regarding BP  Recent Office Vitals: BP Readings from Last 3 Encounters:  01/28/20 128/76  01/09/20 130/76  12/22/19 (!) 124/56   Pulse Readings from Last 3 Encounters:  01/28/20 73  01/09/20 63  12/22/19 60    Wt Readings from Last 3 Encounters:  01/28/20 119 lb (54 kg)  01/09/20 120 lb 8 oz (54.7 kg)  12/22/19 118 lb 12.8 oz (53.9 kg)     Kidney Function Lab Results  Component Value Date/Time   CREATININE 0.86 01/27/2020 04:11 PM   CREATININE 0.94 12/22/2019 11:40 AM   GFR 60.94 01/27/2020 04:11 PM   GFRNONAA 55 (L) 12/22/2019 11:40 AM   GFRAA 64 12/22/2019 11:40 AM    BMP Latest Ref Rng & Units 01/27/2020 12/22/2019 11/05/2019  Glucose 70 - 99 mg/dL 915(A)  92 94  BUN 6 - 23 mg/dL 16 18 13   Creatinine 0.40 - 1.20 mg/dL 7.61 6.07  BUN/Creat Ratio 12 - 28 - 19 -  Sodium 135 - 145 mEq/L 136 140 140  Potassium 3.5 - 5.1 mEq/L 4.1 4.8 3.8  Chloride 96 - 112 mEq/L 103 104 109  CO2 19 - 32 mEq/L 28 25 23   Calcium 8.4 - 10.5 mg/dL 3.71 9.2    . Current antihypertensive regimen:  Losartan  . How often are you checking your Blood Pressure?   Patient stated she really doesn't check it often, she can tell when it is up and then takes her medication accordingly.  . Current home BP readings: Patient states it  fluctuates a lot she feels best when it is in the higher 120's, but feels bad when it is in the 160's.   She said that lately it has been doing well.    . What recent interventions/DTPs have been made by any provider to improve Blood Pressure control since last CPP Visit: Patient stated she watches her diet, said she really doesn't like meat that much so her protein is probably not what it should be.  She stated she has congestin in the morning and sometimes she has yellow mucus, she stated she gets some SOB.  She is going to see cardiac specialist today.   She has an eye doctor appointment tomorrow because she is having vision changes and headaches.  Patient stated she has a quadruple bypass 2 years ago.   . Any recent hospitalizations or ED visits since last visit with CPP? No   . What diet changes have been made to improve Blood Pressure Control?  Patient stated she watches her diet and takes her medication as directed.  . What exercise is being done to improve your Blood Pressure Control?  Patient stays active despite her weak legs and shortness of breath.  Adherence Review: Is the patient currently on ACE/ARB medication? Yes Does the patient have >5 day gap between last estimated fill dates? No   06.2, [facility] Clinical Pharmacist Assistant [phone]

## 2020-05-08 ENCOUNTER — Encounter: Payer: Self-pay | Admitting: Physician Assistant

## 2020-05-19 ENCOUNTER — Ambulatory Visit
Admission: RE | Admit: 2020-05-19 | Discharge: 2020-05-19 | Disposition: A | Discharge status: Home / Self Care | Payer: Medicare Other | Source: Ambulatory Visit | Attending: Physician Assistant | Admitting: Physician Assistant

## 2020-05-19 DIAGNOSIS — R0602 Shortness of breath: Secondary | ICD-10-CM

## 2020-05-19 DIAGNOSIS — R059 Cough, unspecified: Secondary | ICD-10-CM

## 2020-05-25 ENCOUNTER — Other Ambulatory Visit: Payer: Self-pay

## 2020-05-25 ENCOUNTER — Ambulatory Visit (HOSPITAL_COMMUNITY): Payer: Medicare Other | Attending: Cardiology

## 2020-05-25 DIAGNOSIS — R059 Cough, unspecified: Secondary | ICD-10-CM | POA: Diagnosis not present

## 2020-05-25 DIAGNOSIS — R0602 Shortness of breath: Secondary | ICD-10-CM | POA: Diagnosis not present

## 2020-05-25 LAB — ECHOCARDIOGRAM COMPLETE
Area-P 1/2: 3.77 cm2
S' Lateral: 2.5 cm

## 2020-05-28 ENCOUNTER — Ambulatory Visit: Payer: Medicare Other | Admitting: Family Medicine

## 2020-06-01 ENCOUNTER — Ambulatory Visit (INDEPENDENT_AMBULATORY_CARE_PROVIDER_SITE_OTHER): Payer: Medicare Other | Admitting: Cardiovascular Disease

## 2020-06-01 ENCOUNTER — Other Ambulatory Visit: Payer: Self-pay

## 2020-06-01 ENCOUNTER — Encounter: Payer: Self-pay | Admitting: Cardiovascular Disease

## 2020-06-01 VITALS — BP 140/74 | HR 72 | Ht 64.0 in | Wt 122.0 lb

## 2020-06-01 DIAGNOSIS — I25810 Atherosclerosis of coronary artery bypass graft(s) without angina pectoris: Secondary | ICD-10-CM | POA: Diagnosis not present

## 2020-06-01 DIAGNOSIS — R0602 Shortness of breath: Secondary | ICD-10-CM | POA: Diagnosis not present

## 2020-06-01 DIAGNOSIS — E785 Hyperlipidemia, unspecified: Secondary | ICD-10-CM

## 2020-06-01 DIAGNOSIS — I9789 Other postprocedural complications and disorders of the circulatory system, not elsewhere classified: Secondary | ICD-10-CM | POA: Diagnosis not present

## 2020-06-01 DIAGNOSIS — I10 Essential (primary) hypertension: Secondary | ICD-10-CM | POA: Diagnosis not present

## 2020-06-01 DIAGNOSIS — I4891 Unspecified atrial fibrillation: Secondary | ICD-10-CM

## 2020-06-01 NOTE — Patient Instructions (Signed)

## 2020-06-01 NOTE — Progress Notes (Signed)
Cardiology Office Note    Date:  06/06/2020   ID:  EURA MCCAUSLIN, DOB 1930/07/11, MRN [id number]  PCP:  Blane Ohara, MD  Cardiologist:   Thurmon Fair, MD   Chief Complaint  Patient presents with  . Follow-up    Echo.  . Chest Pain    Every day.    History of Present Illness:  Leah Pittman is a 87 y.o. female with HTN and severe hypercholesterolemia, labile HTN and multivessel CAD s/p CABG (07/10/2018, [place], LIMA to LAD, SVG to diagonal, sequential SVG to OM1+OM 2), complicated by transient postoperative atrial fibrillation.  She is here with her husband Leah Pittman who is also my patient.  Leah Pittman continues to be plagued by a variety of aches and pains including in her chest.  The hurting is "all over", always worse at night.  She describes it as a "burning ache".  Stopping statin therapy for 2 months did not lead to any improvement.  When she wakes up in the morning she is hoarse and has to cough.  She also complains of discomfort in her abdomen.  She has early satiety.  She has severe constipation.  She had a barium swallow that appeared to show normal findings.  She has had a cholecystectomy.  She denies dizziness, palpitations, syncope.  She complains of shortness of breath "all the time" although this is not worsened by physical activity or lying in bed.  She remains quite sedentary.  She denies lower extremity edema and claudication.  She has not had any focal neurological events.  She had an excellent HDL level at 69 on her most recent lipid profile, but her LDL cholesterol was high.  She has been taking simvastatin and is due a repeat lipid profile.  Her blood pressure generally well controlled on losartan.  Importantly, she did not describe the pattern of angina that had come to associated with her ischemic coronary syndromes.  She used to describe this with both hands pushing down deep in her epigastric area.   The echocardiogram performed just a few days before this  appointment shows normal left ventricular global and regional systolic function including an excellent global longitudinal strain.  In 2013 renal artery duplex did not show evidence of renal artery stenosis.  Pre-bypass carotid scan showed no evidence of significant stenoses and there was antegrade flow in both vertebral arteries.  She has normal left ventricular systolic function and has diastolic dysfunction which appears appropriate for age.  Past Medical History:  Diagnosis Date  . Anxiety   . Arthritis   . Chest pain 2011   CARDIOLITE - no significant symptoms, EKG changes, or arrhythmias  . Congenital prolapse of bladder mucosa   . Diverticulosis   . Dyspnea 02/16/2012   2D ECHO - EF 55-60%, normal  . Esophageal dysphagia   . Fatigue   . GERD (gastroesophageal reflux disease)   . Glaucoma   . Headache(784.0)   . Heart attack (HCC)   . Hiatal hernia   . High blood pressure 02/16/2012   RENAL DOPPLER - normal  . Hyperlipidemia   . Hypothyroidism   . Labile hypertension   . Lightheadedness   . Migraine headache   . Multiple allergies   . Myalgia   . OSA (obstructive sleep apnea)   . Pain, lower leg    Calf  . Problem of menstruation   . Proteinuria   . Reflux   . SOB (shortness of breath)   . Thyroid disease   .  Urinary problem   . Valvular heart disease   . Vitamin B12 deficiency   . Vitamin D deficiency     Past Surgical History:  Procedure Laterality Date  . ABDOMINAL HYSTERECTOMY  1976  . CHOLECYSTECTOMY  2000  . COLONOSCOPY  07/27/2014   Moderate predominantly sigmoid divertuculosis. Otherwise normal colonoscopy  . CORONARY ARTERY BYPASS GRAFT N/A 07/10/2018   Procedure: CORONARY ARTERY BYPASS GRAFTING (CABG), FREE LIMA;  Surgeon: [name], MD;  Location: [facility] OR;  Service: Open Heart Surgery;  Laterality: N/A;  . CYST REMOVAL NECK    . CYSTECTOMY  1974   Intestine  . CYSTECTOMY  1996   Brain stem  . ESOPHAGOGASTRODUODENOSCOPY  12/07/2016    Schatzki's ring status post esophageal dilatation. Small hiatal hernia. Mild gastritis  . EYE SURGERY  2003  . EYE SURGERY  07/2016  . LEFT HEART CATH AND CORONARY ANGIOGRAPHY N/A 07/08/2018   Procedure: LEFT HEART CATH AND CORONARY ANGIOGRAPHY;  Surgeon: Runell Gess, MD;  Location: [facility] INVASIVE [facility];  Service: Cardiovascular;  Laterality: N/A;  . LEFT HEART CATH AND CORONARY ANGIOGRAPHY N/A 07/25/2019   Procedure: LEFT HEART CATH AND CORONARY ANGIOGRAPHY;  Surgeon: Kathleene Hazel, MD;  Location: [facility];  Service: Cardiovascular;  Laterality: N/A;  . MOUTH SURGERY  2013  . RADIOLOGY WITH ANESTHESIA N/A 11/05/2019   Procedure: MRI WITH ANESTHESIA;  Surgeon: Radiologist, Medication, MD;  Location: [facility] OR;  Service: Radiology;  Laterality: N/A;  . TEE WITHOUT CARDIOVERSION N/A 07/10/2018   Procedure: TRANSESOPHAGEAL ECHOCARDIOGRAM (TEE);  Surgeon: [name], MD;  Location: [facility] OR;  Service: Open Heart Surgery;  Laterality: N/A;  . TONSILLECTOMY     age 39    Current Medications: Outpatient Medications Prior to Visit  Medication Sig Dispense Refill  . acetaminophen (TYLENOL) 325 MG tablet Take 975 mg by mouth every 8 (eight) hours as needed for mild pain or headache.     Marland Kitchen aspirin 81 MG tablet Take 81 mg by mouth daily.    . Calcium Citrate-Vitamin D (CALCIUM CITRATE+D3 PETITES PO) Take 1 tablet by mouth daily.    Marland Kitchen docusate sodium (COLACE) 100 MG capsule Take 100 mg by mouth daily as needed for mild constipation.    . dorzolamide-timolol (COSOPT) 22.3-6.8 MG/ML ophthalmic solution Place 1 drop into the left eye 2 (two) times daily.     Marland Kitchen latanoprost (XALATAN) 0.005 % ophthalmic solution Place 1 drop into both eyes at bedtime.     Marland Kitchen levothyroxine (SYNTHROID) 100 MCG tablet Take 1 tablet (100 mcg total) by mouth daily before breakfast. 90 tablet 1  . losartan (COZAAR) 100 MG tablet Take 1 tablet (100 mg total) by mouth daily. (Patient taking differently: Take 25 mg by  mouth daily. Patient reports taking 25 mg daily.) 30 tablet 1  . magnesium oxide (MAG-OX) 400 MG tablet Take 400 mg by mouth daily. Chewable    . nitroGLYCERIN (NITROSTAT) 0.4 MG SL tablet Place 0.4 mg under the tongue every 5 (five) minutes as needed for chest pain.     Marland Kitchen ondansetron (ZOFRAN) 8 MG tablet TAKE 1 TABLET (8 MG TOTAL) BY MOUTH 2 (TWO) TIMES DAILY AS NEEDED FOR NAUSEA OR VOMITING. 40 tablet 1  . Pancrelipase, Lip-Prot-Amyl, (ZENPEP PO) Take 1 tablet by mouth daily. Take 30 minutes before your breakfast    . pantoprazole (PROTONIX) 40 MG tablet TAKE 1 TABLET BY MOUTH EVERY DAY 90 tablet 0  . potassium chloride SA (KLOR-CON) 20  MEQ tablet Take 20 mEq by mouth daily.    . Probiotic Product (PROBIOTIC-10 PO) Take 1 tablet by mouth daily in the afternoon.    Bernadette Hoit. Sennosides-Docusate Sodium (SENNA PLUS PO) Take 1 tablet by mouth as directed.    . simvastatin (ZOCOR) 20 MG tablet TAKE 1 TABLET BY MOUTH EVERYDAY AT BEDTIME 90 tablet 0  . Vitamin D, Cholecalciferol, 50 MCG (2000 UT) CAPS Take 1 tablet by mouth daily.     No facility-administered medications prior to visit.     Allergies:   Benadryl [diphenhydramine], Zenpep [pancrelipase (lip-prot-amyl)], Codeine, Meperidine, Prednisone, Promethazine, Propranolol, Shellfish allergy, Tape, Tazarotene, and Iodinated diagnostic agents   Social History   Socioeconomic History  . Marital status: Married    Spouse name: Not on file  . Number of children: 3  . Years of education: college  . Highest education level: Not on file  Occupational History  . Occupation: Retired  Tobacco Use  . Smoking status: Never Smoker  . Smokeless tobacco: Never Used  Vaping Use  . Vaping Use: Never used  Substance and Sexual Activity  . Alcohol use: No  . Drug use: No  . Sexual activity: Not on file  Other Topics Concern  . Not on file  Social History Narrative   [place]   Social Determinants of Health   Financial  Resource Strain: Not on file  Food Insecurity: No Food Insecurity  . Worried About Programme researcher, broadcasting/film/videounning Out of Food in the Last Year: Never true  . Ran Out of Food in the Last Year: Never true  Transportation Needs: Not on file  Physical Activity: Not on file  Stress: Not on file  Social Connections: Not on file     Family History:  The patient's family history includes Asthma in her brother; Cancer in her brother, brother, and sister; Heart attack in her father; Stroke in her father.   ROS:   Please see the history of present illness.    ROS All other systems reviewed and are negative.   PHYSICAL EXAM:   VS:  BP 140/74 (BP Location: Left Arm, Patient Position: Sitting, Cuff Size: Normal)   Pulse 72   Ht 5\' 4"  (1.626 m)   Wt 122 lb (55.3 kg)   BMI 20.94 kg/m       General: Alert, oriented x3, no distress, very lean Head: no evidence of trauma, PERRL, EOMI, no exophtalmos or lid lag, no myxedema, no xanthelasma; normal ears, nose and oropharynx Neck: normal jugular venous pulsations and no hepatojugular reflux; brisk carotid pulses without delay and no carotid bruits Chest: clear to auscultation, no signs of consolidation by percussion or palpation, normal fremitus, symmetrical and full respiratory excursions Cardiovascular: normal position and quality of the apical impulse, regular rhythm, normal first and second heart sounds, no murmurs, rubs or gallops Abdomen: no tenderness or distention, no masses by palpation, no abnormal pulsatility or arterial bruits, normal bowel sounds, no hepatosplenomegaly Extremities: no clubbing, cyanosis or edema; 2+ radial, ulnar and brachial pulses bilaterally; 2+ right femoral, posterior tibial and dorsalis pedis pulses; 2+ left femoral, posterior tibial and dorsalis pedis pulses; no subclavian or femoral bruits Neurological: grossly nonfocal Psych: Normal mood and affect   Wt Readings from Last 3 Encounters:  06/01/20 122 lb (55.3 kg)  05/06/20 124 lb  12.8 oz (56.6 kg)  01/28/20 119 lb (54 kg)    Studies/Labs Reviewed:  CT Chest 05/19/2020 Small sliding-type hiatal hernia. Calcified right hilar lymph nodes  are noted consistent with prior granulomatous disease. Bilateral scarring and bronchiectasis is noted. No acute pulmonary abnormality is noted. Aortic Atherosclerosis   ECHO 05/25/2020 1. Left ventricular ejection fraction, by estimation, is 55 to 60%. The  left ventricle has normal function. The left ventricle has no regional  wall motion abnormalities. Left ventricular diastolic parameters are  consistent with Grade I diastolic  dysfunction (impaired relaxation). The average left ventricular global  longitudinal strain is -22.3 %. The global longitudinal strain is normal.  2. Right ventricular systolic function is normal. The right ventricular  size is normal.  3. The mitral valve is normal in structure. No evidence of mitral valve  regurgitation. No evidence of mitral stenosis.  4. The aortic valve is normal in structure. Aortic valve regurgitation is  trivial. No aortic stenosis is present.  5. The inferior vena cava is normal in size with greater than 50%  respiratory variability, suggesting right atrial pressure of 3 mmHg.   Comparison(s): 07/25/19 EF 50-55%.   EKG:  EKG is ordered today.  The tracing from 01/28/2020 showed normal sinus rhythm with very minor nonspecific ST segment changes mostly in the lateral leads, QTC 460 ms.  No change from previous tracing. Recent Labs: 09/23/2018 Total cholesterol 170, HDL 64, LDL 90, triglycerides 185 Hemoglobin A1c 5.8% Hemoglobin 12.0, creatinine 0.84, potassium 4.3, normal TSH and liver function tests  01/21/2019 Total cholesterol 164, HDL 69, LDL 69, triglycerides 90 Hemoglobin A1c 5.7%, hemoglobin 12.6, creatinine 0.88, potassium 5.1, normal liver function tests, TSH 0.441   11/04/2019 Total cholesterol 206, HDL 69, LDL 120, triglycerides 86 50/06/3974 Hemoglobin  12.4, creatinine 0.94, potassium 4.8, magnesium 2.2, normal liver function test, TSH 0.87  ASSESSMENT:    1. Coronary artery disease involving coronary bypass graft of native heart without angina pectoris   2. Shortness of breath   3. Postoperative atrial fibrillation (HCC)   4. Essential hypertension   5. Hyperlipidemia LDL goal <70      PLAN:  In order of problems listed above:  1. CAD s/p CABG: Has always been extremely challenging to separate angina pectoris from multiple variable complaints of discomfort in the chest and abdomen.  Cardiac catheterization performed less than a year ago showed known native coronary artery disease, but all the bypass grafts were widely patent.  I do not think repeat evaluation is indicated at this time. 2. Dyspnea at rest: Her echocardiogram does not support a cardiac etiology for her dyspnea. 3. Postop AFib: No recurrence since the immediate postop period.  Not on anticoagulation. 4. HTN: Fair control 5. HLP: Her statin holiday did not lead to any improvement in her multiple musculoskeletal complaints.  She should continue the simvastatin.  Target LDL less than 100.     Medication Adjustments/Labs and Tests Ordered: Current medicines are reviewed at length with the patient today.  Concerns regarding medicines are outlined above.  Medication changes, Labs and Tests ordered today are listed in the Patient Instructions below. Patient Instructions  Medication Instructions:  No changes *If you need a refill on your cardiac medications before your next appointment, please call your pharmacy*   Lab Work: None ordered If you have labs (blood work) drawn today and your tests are completely normal, you will receive your results only by: Marland Kitchen MyChart Message (if you have MyChart) OR . A paper copy in the mail If you have any lab test that is abnormal or we need to change your treatment, we will call you to review the results.  Testing/Procedures: None  ordered   Follow-Up: At [facility], you and your health needs are our priority.  As part of our continuing mission to provide you with exceptional heart care, we have created designated Provider Care Teams.  These Care Teams include your primary Cardiologist (physician) and Advanced Practice Providers (APPs -  Physician Assistants and Nurse Practitioners) who all work together to provide you with the care you need, when you need it.  We recommend signing up for the patient portal called "MyChart".  Sign up information is provided on this After Visit Summary.  MyChart is used to connect with patients for Virtual Visits (Telemedicine).  Patients are able to view lab/test results, encounter notes, upcoming appointments, etc.  Non-urgent messages can be sent to your provider as well.   To learn more about what you can do with MyChart, go to [email or web address].    Your next appointment:   12 month(s)  The format for your next appointment:   In Person  Provider:   You may see Thurmon Fair, MD or one of the following Advanced Practice Providers on your designated Care Team:    Azalee Course, PA-C  Micah Flesher, [place] or   Judy Pimple, PA-C     Signed, Thurmon Fair, MD  06/06/2020 11:18 AM    [facility] [address] [place], [place], [place]  [address] Phone: [phone]; Fax: [phone]

## 2020-06-11 ENCOUNTER — Telehealth: Payer: Self-pay

## 2020-06-11 NOTE — Progress Notes (Signed)
  Entered in error   Josiah Lobo, CMA  [phone] Clinical Pharmacist Assistant

## 2020-06-14 ENCOUNTER — Encounter: Payer: Self-pay | Admitting: Family Medicine

## 2020-06-14 ENCOUNTER — Ambulatory Visit (INDEPENDENT_AMBULATORY_CARE_PROVIDER_SITE_OTHER): Payer: Medicare Other | Admitting: Family Medicine

## 2020-06-14 ENCOUNTER — Other Ambulatory Visit: Payer: Self-pay

## 2020-06-14 VITALS — BP 134/72 | HR 80 | Temp 97.0°F | Resp 18 | Ht 64.0 in | Wt 123.6 lb

## 2020-06-14 DIAGNOSIS — R1319 Other dysphagia: Secondary | ICD-10-CM

## 2020-06-14 DIAGNOSIS — E782 Mixed hyperlipidemia: Secondary | ICD-10-CM | POA: Diagnosis not present

## 2020-06-14 DIAGNOSIS — I2510 Atherosclerotic heart disease of native coronary artery without angina pectoris: Secondary | ICD-10-CM | POA: Diagnosis not present

## 2020-06-14 DIAGNOSIS — K5900 Constipation, unspecified: Secondary | ICD-10-CM

## 2020-06-14 DIAGNOSIS — R140 Abdominal distension (gaseous): Secondary | ICD-10-CM

## 2020-06-14 DIAGNOSIS — R202 Paresthesia of skin: Secondary | ICD-10-CM

## 2020-06-14 DIAGNOSIS — I10 Essential (primary) hypertension: Secondary | ICD-10-CM

## 2020-06-14 DIAGNOSIS — N644 Mastodynia: Secondary | ICD-10-CM

## 2020-06-14 MED ORDER — PANTOPRAZOLE SODIUM 40 MG PO TBEC: 40.0000 mg | DELAYED_RELEASE_TABLET | Freq: Two times a day (BID) | ORAL | 1 refills | Status: DC

## 2020-06-14 MED ORDER — PANTOPRAZOLE SODIUM 40 MG PO TBEC: 1.0000 | DELAYED_RELEASE_TABLET | Freq: Every day | ORAL | 3 refills | Status: DC

## 2020-06-14 NOTE — Patient Instructions (Signed)
Increase pantoprazole to 40 mg one twice daily.  Increase miralax to one dose twice a day.  Refer to GI for persistent swallowing issues and abdominal pain  Order a diagnostic mammogram of right breast.

## 2020-06-14 NOTE — Progress Notes (Signed)
Subjective:  Patient ID: Leah Pittman, female    DOB: 1930-12-28  Age: 87 y.o. MRN: [id number]  Chief Complaint  Patient presents with  . Hyperlipidemia  . Hypertension    HPI Patient is an 87 year old female who has had problems with epigastric and right upper quadrant discomfort for some time.  She is also complaining of some swelling that she feels like is in her abdomen as well as her breasts more the right than the left.  She was seen by Dr. Chales Abrahams in January 09, 2020 where he reviewed her chart and recommended a upper GI test as well as some stool studies.  Stool studies were normal.  Her upper GI test was essentially normal.  Her last EGD done with Dr. Chales Abrahams was in 2018.  At that time she had a mild esophageal stricture which she dilated. Patient continues to have issues with dysphagia. Has to eat very small pieces of meat.  She was told her pancreas was swollen.  She is taking miralax, and a stool softener. Pt tried lactulose, but only managed to get 3 doses down because she could not stomach the sweetness. It is gagging.   Pt saw Dr. Francis Dowse PA, who ordered a ct of chest and repeat echocardiogram in 04/2020.   CT CHEST: IMPRESSION: Small sliding-type hiatal hernia. Calcified right hilar lymph nodes are noted consistent with prior granulomatous disease. Bilateral scarring and bronchiectasis is noted. No acute pulmonary abnormality is noted. Aortic Atherosclerosis (ICD10-I70.0). ECHO:norml.    Current Outpatient Medications on File Prior to Visit  Medication Sig Dispense Refill  . acetaminophen (TYLENOL) 325 MG tablet Take 975 mg by mouth every 8 (eight) hours as needed for mild pain or headache.     Marland Kitchen aspirin 81 MG tablet Take 81 mg by mouth daily.    . Calcium Citrate-Vitamin D (CALCIUM CITRATE+D3 PETITES PO) Take 1 tablet by mouth daily.    . dorzolamide-timolol (COSOPT) 22.3-6.8 MG/ML ophthalmic solution Place 1 drop into the left eye 2 (two) times daily.     Marland Kitchen  latanoprost (XALATAN) 0.005 % ophthalmic solution Place 1 drop into both eyes at bedtime.     Marland Kitchen levothyroxine (SYNTHROID) 100 MCG tablet Take 1 tablet (100 mcg total) by mouth daily before breakfast. 90 tablet 1  . losartan (COZAAR) 100 MG tablet Take 1 tablet (100 mg total) by mouth daily. (Patient taking differently: Take 25 mg by mouth daily. Patient reports taking 25 mg daily.) 30 tablet 1  . magnesium oxide (MAG-OX) 400 MG tablet Take 400 mg by mouth daily. Chewable    . nitroGLYCERIN (NITROSTAT) 0.4 MG SL tablet Place 0.4 mg under the tongue every 5 (five) minutes as needed for chest pain.     Marland Kitchen ondansetron (ZOFRAN) 8 MG tablet TAKE 1 TABLET (8 MG TOTAL) BY MOUTH 2 (TWO) TIMES DAILY AS NEEDED FOR NAUSEA OR VOMITING. 40 tablet 1  . potassium chloride SA (KLOR-CON) 20 MEQ tablet Take 20 mEq by mouth daily.    . Probiotic Product (PROBIOTIC-10 PO) Take 1 tablet by mouth daily in the afternoon.    Bernadette Hoit Sodium (SENNA PLUS PO) Take 1 tablet by mouth as directed.    . simvastatin (ZOCOR) 20 MG tablet TAKE 1 TABLET BY MOUTH EVERYDAY AT BEDTIME (Patient not taking: Reported on 06/14/2020) 90 tablet 0  . Vitamin D, Cholecalciferol, 50 MCG (2000 UT) CAPS Take 1 tablet by mouth daily.    . XELPROS 0.005 % EMUL Place 1 drop into the  left eye at bedtime.     No current facility-administered medications on file prior to visit.   Past Medical History:  Diagnosis Date  . Anxiety   . Arthritis   . Chest pain 2011   CARDIOLITE - no significant symptoms, EKG changes, or arrhythmias  . Congenital prolapse of bladder mucosa   . Diverticulosis   . Dyspnea 02/16/2012   2D ECHO - EF 55-60%, normal  . Esophageal dysphagia   . Fatigue   . GERD (gastroesophageal reflux disease)   . Glaucoma   . Headache(784.0)   . Heart attack (HCC)   . Hiatal hernia   . High blood pressure 02/16/2012   RENAL DOPPLER - normal  . Hyperlipidemia   . Hypothyroidism   . Labile hypertension   .  Lightheadedness   . Migraine headache   . Multiple allergies   . Myalgia   . OSA (obstructive sleep apnea)   . Pain, lower leg    Calf  . Problem of menstruation   . Proteinuria   . Reflux   . SOB (shortness of breath)   . Thyroid disease   . Urinary problem   . Valvular heart disease   . Vitamin B12 deficiency   . Vitamin D deficiency    Past Surgical History:  Procedure Laterality Date  . ABDOMINAL HYSTERECTOMY  1976  . CHOLECYSTECTOMY  2000  . COLONOSCOPY  07/27/2014   Moderate predominantly sigmoid divertuculosis. Otherwise normal colonoscopy  . CORONARY ARTERY BYPASS GRAFT N/A 07/10/2018   Procedure: CORONARY ARTERY BYPASS GRAFTING (CABG), FREE LIMA;  Surgeon: [name], MD;  Location: [facility] OR;  Service: Open Heart Surgery;  Laterality: N/A;  . CYST REMOVAL NECK    . CYSTECTOMY  1974   Intestine  . CYSTECTOMY  1996   Brain stem  . ESOPHAGOGASTRODUODENOSCOPY  12/07/2016   Schatzki's ring status post esophageal dilatation. Small hiatal hernia. Mild gastritis  . EYE SURGERY  2003  . EYE SURGERY  07/2016  . LEFT HEART CATH AND CORONARY ANGIOGRAPHY N/A 07/08/2018   Procedure: LEFT HEART CATH AND CORONARY ANGIOGRAPHY;  Surgeon: Runell Gess, MD;  Location: [facility] INVASIVE CV LAB;  Service: Cardiovascular;  Laterality: N/A;  . LEFT HEART CATH AND CORONARY ANGIOGRAPHY N/A 07/25/2019   Procedure: LEFT HEART CATH AND CORONARY ANGIOGRAPHY;  Surgeon: Kathleene Hazel, MD;  Location: [facility] INVASIVE CV LAB;  Service: Cardiovascular;  Laterality: N/A;  . MOUTH SURGERY  2013  . RADIOLOGY WITH ANESTHESIA N/A 11/05/2019   Procedure: MRI WITH ANESTHESIA;  Surgeon: Radiologist, Medication, MD;  Location: [facility] OR;  Service: Radiology;  Laterality: N/A;  . TEE WITHOUT CARDIOVERSION N/A 07/10/2018   Procedure: TRANSESOPHAGEAL ECHOCARDIOGRAM (TEE);  Surgeon: [name], MD;  Location: [facility] OR;  Service: Open Heart Surgery;  Laterality: N/A;  . TONSILLECTOMY     age 28    Family History   Problem Relation Age of Onset  . Heart attack Father   . Stroke Father   . Cancer Sister   . Cancer Brother   . Asthma Brother   . Cancer Brother    Social History   Socioeconomic History  . Marital status: Married    Spouse name: Not on file  . Number of children: 3  . Years of education: college  . Highest education level: Not on file  Occupational History  . Occupation: Retired  Tobacco Use  . Smoking status: Never Smoker  . Smokeless tobacco: Never Used  Vaping Use  .  Vaping Use: Never used  Substance and Sexual Activity  . Alcohol use: No  . Drug use: No  . Sexual activity: Not on file  Other Topics Concern  . Not on file  Social History Narrative   [place]   Social Determinants of Health   Financial Resource Strain: Not on file  Food Insecurity: No Food Insecurity  . Worried About Programme researcher, broadcasting/film/videounning Out of Food in the Last Year: Never true  . Ran Out of Food in the Last Year: Never true  Transportation Needs: Not on file  Physical Activity: Not on file  Stress: Not on file  Social Connections: Not on file    Review of Systems  Constitutional: Positive for fatigue. Negative for chills and fever.  HENT: Negative for congestion, ear pain, rhinorrhea and sore throat.   Respiratory: Positive for cough. Negative for shortness of breath.   Cardiovascular: Positive for chest pain (points to rt lower medial breast. ).  Gastrointestinal: Positive for abdominal pain and constipation. Negative for diarrhea, nausea and vomiting.  Genitourinary: Negative for dysuria and urgency.  Musculoskeletal: Negative for back pain and myalgias.  Neurological: Positive for numbness. Negative for dizziness, weakness, light-headedness and headaches.  Psychiatric/Behavioral: Negative for dysphoric mood. The patient is not nervous/anxious.      Objective:  BP 134/72   Pulse 80   Temp (!) 97 F (36.1 C)   Resp 18   Ht 5\' 4"  (1.626 m)   Wt 123 lb 9.6 oz (56.1  kg)   BMI 21.22 kg/m   BP/Weight 06/14/2020 06/01/2020 05/06/2020  Systolic BP 134 140 196  Diastolic BP 72 74 70  Wt. (Lbs) 123.6 122 124.8  BMI 21.22 20.94 21.42    Physical Exam Vitals reviewed.  Constitutional:      Appearance: Normal appearance. She is normal weight.  Neck:     Vascular: No carotid bruit.  Cardiovascular:     Rate and Rhythm: Normal rate and regular rhythm.     Pulses: Normal pulses.     Heart sounds: Normal heart sounds.  Pulmonary:     Effort: Pulmonary effort is normal. No respiratory distress.     Breath sounds: Normal breath sounds.  Chest:    Abdominal:     General: Abdomen is flat. Bowel sounds are normal.     Palpations: Abdomen is soft.     Tenderness: There is abdominal tenderness (epigastric).  Neurological:     Mental Status: She is alert and oriented to person, place, and time.  Psychiatric:        Mood and Affect: Mood normal.        Behavior: Behavior normal.     Diabetic Foot Exam - Simple   No data filed      Lab Results  Component Value Date   WBC 6.5 06/14/2020   HGB 12.7 06/14/2020   HCT 37.3 06/14/2020   PLT 230 06/14/2020   GLUCOSE 101 (H) 06/14/2020   CHOL 250 (H) 06/14/2020   TRIG 125 06/14/2020   HDL 65 06/14/2020   LDLCALC 163 (H) 06/14/2020   ALT 16 06/14/2020   AST 13 06/14/2020   NA 139 06/14/2020   K 4.1 06/14/2020   CL 105 06/14/2020   CREATININE 0.88 06/14/2020   BUN 16 06/14/2020   CO2 20 06/14/2020   TSH 0.879 12/22/2019   INR 0.9 12/22/2019   HGBA1C 5.8 (H) 07/10/2018      Assessment & Plan:   1. Mixed hyperlipidemia  Not at goal.  Recommend start on statin. Pt to call back with list of statins she has tried.  Low fat diet.  - Lipid panel  2. Essential hypertension Well controlled.  No changes to medicines.  Continue to work on eating a healthy diet and exercise.  Parameters set by Dr. Odetta Pink. Defer to him for management.  Labs drawn today.  - CBC with Differential/Platelet -  Comprehensive metabolic panel - Cardiovascular Risk Assessment  3. Constipation, unspecified constipation type - Ambulatory referral to Gastroenterology Increase miralax to one dose twice a day.  Refer to GI for constipation, persistent swallowing issues and abdominal pain   4. Coronary artery disease involving native coronary artery of native heart without angina pectoris - Cardiovascular Risk Assessment  5. Paresthesia of both feet - B12 and Folate Panel - Methylmalonic acid, serum - TSH  6. Breast pain, right - MM Digital Diagnostic Unilat R  7. Abdominal bloating See above 8. Esophageal dysphagia Increase pantoprazole to 40 mg one twice daily.  - pantoprazole (PROTONIX) 40 MG tablet; Take 1 tablet (40 mg total) by mouth 2 (two) times daily.  Dispense: 180 tablet; Refill: 1 - Ambulatory referral to Gastroenterology    Meds ordered this encounter  Medications  . DISCONTD: pantoprazole (PROTONIX) 40 MG tablet    Sig: Take 1 tablet (40 mg total) by mouth daily.    Dispense:  90 tablet    Refill:  3  . pantoprazole (PROTONIX) 40 MG tablet    Sig: Take 1 tablet (40 mg total) by mouth 2 (two) times daily.    Dispense:  180 tablet    Refill:  1    Cancel all previous pantoprazole rxs.    Orders Placed This Encounter  Procedures  . MM Digital Diagnostic Unilat R  . CBC with Differential/Platelet  . Comprehensive metabolic panel  . Lipid panel  . B12 and Folate Panel  . Methylmalonic acid, serum  . TSH  . Cardiovascular Risk Assessment  . Ambulatory referral to Gastroenterology     Follow-up: Return in about 3 months (around 09/13/2020) for fasting.  An After Visit Summary was printed and given to the patient.  Blane Ohara, MD [facility] [phone]

## 2020-06-15 LAB — COMPREHENSIVE METABOLIC PANEL
ALT: 16 IU/L (ref 0–32)
AST: 13 IU/L (ref 0–40)
Albumin/Globulin Ratio: 2.1 (ref 1.2–2.2)
Albumin: 4.6 g/dL (ref 3.6–4.6)
Alkaline Phosphatase: 73 IU/L (ref 44–121)
BUN/Creatinine Ratio: 18 (ref 12–28)
BUN: 16 mg/dL (ref 8–27)
Bilirubin Total: 0.4 mg/dL (ref 0.0–1.2)
CO2: 20 mmol/L (ref 20–29)
Calcium: 10 mg/dL (ref 8.7–10.3)
Chloride: 105 mmol/L (ref 96–106)
Creatinine, Ser: 0.88 mg/dL (ref 0.57–1.00)
Globulin, Total: 2.2 g/dL (ref 1.5–4.5)
Glucose: 101 mg/dL — ABNORMAL HIGH (ref 65–99)
Potassium: 4.1 mmol/L (ref 3.5–5.2)
Sodium: 139 mmol/L (ref 134–144)
Total Protein: 6.8 g/dL (ref 6.0–8.5)
eGFR: 64 mL/min/{1.73_m2} (ref >59)

## 2020-06-15 LAB — CARDIOVASCULAR RISK ASSESSMENT

## 2020-06-15 LAB — CBC WITH DIFFERENTIAL/PLATELET
Basophils Absolute: 0.1 10*3/uL (ref 0.0–0.2)
Basos: 2 %
EOS (ABSOLUTE): 0.3 10*3/uL (ref 0.0–0.4)
Eos: 5 %
Hematocrit: 37.3 % (ref 34.0–46.6)
Hemoglobin: 12.7 g/dL (ref 11.1–15.9)
Immature Grans (Abs): 0 10*3/uL (ref 0.0–0.1)
Immature Granulocytes: 0 %
Lymphocytes Absolute: 1.6 10*3/uL (ref 0.7–3.1)
Lymphs: 25 %
MCH: 31.7 pg (ref 26.6–33.0)
MCHC: 34 g/dL (ref 31.5–35.7)
MCV: 93 fL (ref 79–97)
Monocytes Absolute: 0.8 10*3/uL (ref 0.1–0.9)
Monocytes: 12 %
Neutrophils Absolute: 3.7 10*3/uL (ref 1.4–7.0)
Neutrophils: 56 %
Platelets: 230 10*3/uL (ref 150–450)
RBC: 4.01 x10E6/uL (ref 3.77–5.28)
RDW: 14.2 % (ref 11.7–15.4)
WBC: 6.5 10*3/uL (ref 3.4–10.8)

## 2020-06-15 LAB — LIPID PANEL
Chol/HDL Ratio: 3.8 ratio (ref 0.0–4.4)
Cholesterol, Total: 250 mg/dL — ABNORMAL HIGH (ref 100–199)
HDL: 65 mg/dL (ref >39)
LDL Chol Calc (NIH): 163 mg/dL — ABNORMAL HIGH (ref 0–99)
Triglycerides: 125 mg/dL (ref 0–149)
VLDL Cholesterol Cal: 22 mg/dL (ref 5–40)

## 2020-06-15 NOTE — Progress Notes (Addendum)
Chronic Care Management Pharmacy Assistant   Name: JAYEL SCADUTO  MRN: [id number] DOB: 1930-03-23  Leah Pittman is an 87 y.o. year old female who presents for his follow-up CCM visit with the clinical pharmacist.  Reason for Encounter: Disease State for HTN  Recent office visits:  06/14/2020: Blane Ohara, MD (PCP) / Discontinued Docusate 100 mg. , Pancrelipase, Increase Pantoprozole 40 mg. from once a day to twice a day.   Recent consult visits:  06/01/2020: Thurmon Fair, MD (Cardiology) / No medication changes noted  05/07/2020: Tilden Dome, MD (Ophthamology @ [facility]) : for glaucoma eval/ Added latanoprost .005% , dorzolamide-timolol 22.3-6.8 mg/ml. for pigmentary glaucoma B/L, severe stage.   05/06/2020: Azalee Course, PA (Cardiology): for SOB / Discontinued Cetirizine 10mg . Per patient report, Lactulose course completed. Enulose, not taking per patient report. Pregabalin 25 mg., not taking per patient report. Triamcinolone .1% cream, not using per patient report.   Hospital visits:  No hospital visits noted since last visit with , Pharm. D.  Medications: Outpatient Encounter Medications as of 06/11/2020  Medication Sig   acetaminophen (TYLENOL) 325 MG tablet Take 975 mg by mouth every 8 (eight) hours as needed for mild pain or headache.    aspirin 81 MG tablet Take 81 mg by mouth daily.   Calcium Citrate-Vitamin D (CALCIUM CITRATE+D3 PETITES PO) Take 1 tablet by mouth daily.   docusate sodium (COLACE) 100 MG capsule Take 100 mg by mouth daily as needed for mild constipation.   dorzolamide-timolol (COSOPT) 22.3-6.8 MG/ML ophthalmic solution Place 1 drop into the left eye 2 (two) times daily.    latanoprost (XALATAN) 0.005 % ophthalmic solution Place 1 drop into both eyes at bedtime.    levothyroxine (SYNTHROID) 100 MCG tablet Take 1 tablet (100 mcg total) by mouth daily before breakfast.   losartan (COZAAR) 100 MG tablet Take 1 tablet (100 mg total) by  mouth daily. (Patient taking differently: Take 25 mg by mouth daily. Patient reports taking 25 mg daily.)   magnesium oxide (MAG-OX) 400 MG tablet Take 400 mg by mouth daily. Chewable   nitroGLYCERIN (NITROSTAT) 0.4 MG SL tablet Place 0.4 mg under the tongue every 5 (five) minutes as needed for chest pain.    ondansetron (ZOFRAN) 8 MG tablet TAKE 1 TABLET (8 MG TOTAL) BY MOUTH 2 (TWO) TIMES DAILY AS NEEDED FOR NAUSEA OR VOMITING.   Pancrelipase, Lip-Prot-Amyl, (ZENPEP PO) Take 1 tablet by mouth daily. Take 30 minutes before your breakfast   pantoprazole (PROTONIX) 40 MG tablet TAKE 1 TABLET BY MOUTH EVERY DAY   potassium chloride SA (KLOR-CON) 20 MEQ tablet Take 20 mEq by mouth daily.   Probiotic Product (PROBIOTIC-10 PO) Take 1 tablet by mouth daily in the afternoon.   Sennosides-Docusate Sodium (SENNA PLUS PO) Take 1 tablet by mouth as directed.   simvastatin (ZOCOR) 20 MG tablet TAKE 1 TABLET BY MOUTH EVERYDAY AT BEDTIME   Vitamin D, Cholecalciferol, 50 MCG (2000 UT) CAPS Take 1 tablet by mouth daily.   No facility-administered encounter medications on file as of 06/11/2020.    Recent Office Vitals: BP Readings from Last 3 Encounters:  06/01/20 140/74  05/06/20 (!) 196/70  01/28/20 128/76   Pulse Readings from Last 3 Encounters:  06/01/20 72  05/06/20 70  01/28/20 73    Wt Readings from Last 3 Encounters:  06/01/20 122 lb (55.3 kg)  05/06/20 124 lb 12.8 oz (56.6 kg)  01/28/20 119 lb (54 kg)  Kidney Function Lab Results  Component Value Date/Time   CREATININE 0.86 01/27/2020 04:11 PM   CREATININE 0.94 12/22/2019 11:40 AM   GFR 60.94 01/27/2020 04:11 PM   GFRNONAA 55 (L) 12/22/2019 11:40 AM   GFRAA 64 12/22/2019 11:40 AM    BMP Latest Ref Rng & Units 01/27/2020 12/22/2019 11/05/2019  Glucose 70 - 99 mg/dL 606(T) 92 94  BUN 6 - 23 mg/dL 16 18 13   Creatinine 0.40 - 1.20 mg/dL 0.16 0.10  BUN/Creat Ratio 12 - 28 - 19 -  Sodium 135 - 145 mEq/L 136 140 140  Potassium  3.5 - 5.1 mEq/L 4.1 4.8 3.8  Chloride 96 - 112 mEq/L 103 104 109  CO2 19 - 32 mEq/L 28 25 23   Calcium 8.4 - 10.5 mg/dL 9.32 9.2     Current regimen:   Losartan 100 mg. Tablet: Take 1 tablet po qd.        Patient stated that she takes medication as directed.     Patient stated she checks her blood pressure daily at intemittent times, and that her readings are very erratic.   Current home BP readings:    DATE:             BP               PULSE None given       127/50         Patient stated that she does not check pulse    167/90       130/52    140/75   Wrist or arm cuff: Patient stated that she uses an arm cuff. Patient reported erratic systolic readings routinely ranging from 100-140. She stated that a couple of times she has gotten systolic readings as high as 167. I recommended patient to have BP unit calibrated or bring to next office visit to have someone look at  it for her just to be sure she is getting accurate readings.     Salt intake: Patient stated she does not eat a lot of salt anymore.   OTC medications including pseudoephedrine or NSAIDs?        Patient reported that she does not take pseudoephedrine. She stated that              She does take ASA 81 mg. qd, but does not take any other NSAID's.          Any readings above 180/120?        Patient stated the highest systolic reading recently was 35.5, and diastolic was 90.     If yes any symptoms of hypertensive emergency? shortness of breath    What recent interventions/DTPs have been made by any provider to improve Blood Pressure control since last CPP Visit:  Patient stated there have been no recent interventions/ DTP's made by any of             her providers.    Any recent hospitalizations or ED visits since last visit with CPP?   Patient states she has had no recent hospitalizations or ED visits since last                   CPP visit.   What diet changes have been made to improve Blood Pressure  Control?   Patient reported  no recent diet changes.    What exercise is being done to improve your Blood Pressure Control?  Patient stated that she is unable  to exercise due to B/L LE numbness                             and weakness. She stated that she feels like her "legs are going to give out first             thing in the morning" and that she "feels off balance". She stated that she has                "not felt well" since quadruple bypass in 2020.    Adherence Review: Is the patient currently on ACE/ARB medication? Patient is currently on Losartan 100mg . Tablet  Does the patient have >5 day gap between last estimated fill dates? CPP to review   Star Rating Drugs:  Medication:  Last Fill: Day Supply Simvastatin   03/23/2020 90DS   05/21/2020, CMA  [phone] Clinical Pharmacist Assistant

## 2020-06-19 LAB — TSH: TSH: 4.45 u[IU]/mL (ref 0.450–4.500)

## 2020-06-19 LAB — B12 AND FOLATE PANEL
Folate: 10.8 ng/mL (ref >3.0)
Vitamin B-12: 395 pg/mL (ref 232–1245)

## 2020-06-19 LAB — METHYLMALONIC ACID, SERUM: Methylmalonic Acid: 165 nmol/L (ref 0–378)

## 2020-06-28 ENCOUNTER — Telehealth: Payer: Self-pay

## 2020-06-28 NOTE — Progress Notes (Signed)
Chronic Care Management Pharmacy Assistant   Name: Leah Pittman  MRN: [id number] DOB: Jun 25, 1930  07/02/2020: Called patient this morning to obtain blood pressure readings. She was very SOB and said she had been "resting" and "laying around" and "felt worse this morning". Patient was coughing and had a difficult time speaking. She reported that she has been  to the office twice this week for this, but is not getting any better.  I asked her if she would like me to call 911 or if she would like a nurse from the office to call her back. She stated her husband was home with her, and asked me to call Dr. Sedalia Muta to see if she needed to come back in. I called the office and spoke with [place] and she stated that she will call her to follow-up this morning. I contacted Juliane Lack, [facility] to update her on patient.     Recent office visits:   06/30/2020: Blane Ohara, MD (PCP)  / Added Benzonatate 100 mg. bid prn.    06/29/2020:  Blane Ohara, MD (PCP) Added Amoxicillin-Pot-Clavulanate  875-125 mg. Take 1 po bid, Added Prednisone 10 mg. Po w/ breakfast. Ceftiaxone Sodium 1mg  and TAC 60 mg IM administered. / 2-view chest XR's ordered, Covid test administered.  06/14/2020: 06/16/2020, MD (PCP) / Labs ordered, (R) dx mammo ordered for (R) breast pain, Referral to [facility] / Change Pantoprozole dosage from qd to bid. , D/C Docusate and Pancrelipase for ineffectiveness.   Recent consult visits:  06/18/2020: 06/20/2020, MD (Ophthalmology @[facility] ) / for pigmentary glaucoma/ Plan to continue Xelprost x1 OS, Dorzolamide-Timolol x2 OS, Recommend ATs prn/ No new medications noted. RTC in 3 months    Hospital visits:  No hospital visits noted since last visit with CPP   Medications: Outpatient Encounter Medications as of 06/28/2020  Medication Sig  . acetaminophen (TYLENOL) 325 MG tablet Take 975 mg by mouth every 8 (eight) hours as needed for mild pain or headache.   aspirin 81 MG tablet Take 81  mg by mouth daily.  . Calcium Citrate-Vitamin D (CALCIUM CITRATE+D3 PETITES PO) Take 1 tablet by mouth daily.  . dorzolamide-timolol (COSOPT) 22.3-6.8 MG/ML ophthalmic solution Place 1 drop into the left eye 2 (two) times daily.   08/28/2020 latanoprost (XALATAN) 0.005 % ophthalmic solution Place 1 drop into both eyes at bedtime.   Marland Kitchen levothyroxine (SYNTHROID) 100 MCG tablet Take 1 tablet (100 mcg total) by mouth daily before breakfast.  . losartan (COZAAR) 100 MG tablet Take 1 tablet (100 mg total) by mouth daily. (Patient taking differently: Take 25 mg by mouth daily. Patient reports taking 25 mg daily.)  . magnesium oxide (MAG-OX) 400 MG tablet Take 400 mg by mouth daily. Chewable  . nitroGLYCERIN (NITROSTAT) 0.4 MG SL tablet Place 0.4 mg under the tongue every 5 (five) minutes as needed for chest pain.   Marland Kitchen ondansetron (ZOFRAN) 8 MG tablet TAKE 1 TABLET (8 MG TOTAL) BY MOUTH 2 (TWO) TIMES DAILY AS NEEDED FOR NAUSEA OR VOMITING.  . pantoprazole (PROTONIX) 40 MG tablet Take 1 tablet (40 mg total) by mouth 2 (two) times daily.  . potassium chloride SA (KLOR-CON) 20 MEQ tablet Take 20 mEq by mouth daily.  . Probiotic Product (PROBIOTIC-10 PO) Take 1 tablet by mouth daily in the afternoon.  Marland Kitchen Sodium (SENNA PLUS PO) Take 1 tablet by mouth as directed.  . simvastatin (ZOCOR) 20 MG tablet TAKE 1 TABLET BY MOUTH EVERYDAY AT  BEDTIME (Patient not taking: Reported on 06/14/2020)  . Vitamin D, Cholecalciferol, 50 MCG (2000 UT) CAPS Take 1 tablet by mouth daily.  . XELPROS 0.005 % EMUL Place 1 drop into the left eye at bedtime.   No facility-administered encounter medications on file as of 06/28/2020.    Recent Office Vitals: BP Readings from Last 3 Encounters:  06/16/20 134/72  06/01/20 140/74  05/06/20 (!) 196/70   Pulse Readings from Last 3 Encounters:  06/14/20 80  06/01/20 72  05/06/20 70    Wt Readings from Last 3 Encounters:  06/14/20 123 lb 9.6 oz (56.1 kg)  06/01/20 122 lb (55.3  kg)  05/06/20 124 lb 12.8 oz (56.6 kg)     Kidney Function Lab Results  Component Value Date/Time   CREATININE 0.88 06/14/2020 09:22 AM   CREATININE 0.86 01/27/2020 04:11 PM   GFR 60.94 01/27/2020 04:11 PM   GFRNONAA 55 (L) 12/22/2019 11:40 AM   GFRAA 64 12/22/2019 11:40 AM    BMP Latest Ref Rng & Units 06/14/2020 01/27/2020 12/22/2019  Glucose 65 - 99 mg/dL 683(M) 196(Q) 92  BUN 8 - 27 mg/dL 16 16 18   Creatinine 0.57 - 1.00 mg/dL 2.29 7.98  BUN/Creat Ratio 12 - 28 18 - 19  Sodium 134 - 144 mmol/L 139 136 140  Potassium 3.5 - 5.2 mmol/L 4.1 4.1 4.8  Chloride 96 - 106 mmol/L 105 103 104  CO2 20 - 29 mmol/L 20 28 25   Calcium 8.7 - 10.3 mg/dL 9.21 19.4      Star Rating Drugs:  Medication:  Last Fill: Day Supply Losartan  11/10/2019 30DS Simvastatin   03/23/2020 90DS   11/12/2019, CMA  [phone] Clinical Pharmacist Assistant

## 2020-06-29 ENCOUNTER — Ambulatory Visit (INDEPENDENT_AMBULATORY_CARE_PROVIDER_SITE_OTHER): Payer: Medicare Other | Admitting: Nurse Practitioner

## 2020-06-29 ENCOUNTER — Encounter: Payer: Self-pay | Admitting: Nurse Practitioner

## 2020-06-29 VITALS — BP 150/62 | HR 72 | Temp 97.9°F | Ht 64.0 in | Wt 121.0 lb

## 2020-06-29 DIAGNOSIS — R059 Cough, unspecified: Secondary | ICD-10-CM

## 2020-06-29 DIAGNOSIS — J441 Chronic obstructive pulmonary disease with (acute) exacerbation: Secondary | ICD-10-CM

## 2020-06-29 DIAGNOSIS — R509 Fever, unspecified: Secondary | ICD-10-CM

## 2020-06-29 DIAGNOSIS — H4920 Sixth [abducent] nerve palsy, unspecified eye: Secondary | ICD-10-CM | POA: Insufficient documentation

## 2020-06-29 HISTORY — DX: Sixth (abducent) nerve palsy, unspecified eye: H49.20

## 2020-06-29 LAB — POC COVID19 BINAXNOW: SARS Coronavirus 2 Ag: NEGATIVE

## 2020-06-29 MED ORDER — PREDNISONE 10 MG PO TABS: 10.0000 mg | ORAL_TABLET | Freq: Every day | ORAL | 0 refills | Status: DC

## 2020-06-29 MED ORDER — TRIAMCINOLONE ACETONIDE 40 MG/ML IJ SUSP: 40.0000 mg | Freq: Once | INTRAMUSCULAR | Status: DC

## 2020-06-29 MED ORDER — CEFTRIAXONE SODIUM 1 G IJ SOLR
1.0000 g | Freq: Once | INTRAMUSCULAR | Status: AC
  Administered 2020-06-29: 1 g via INTRAMUSCULAR

## 2020-06-29 MED ORDER — TRIAMCINOLONE ACETONIDE 40 MG/ML IJ SUSP
60.0000 mg | Freq: Once | INTRAMUSCULAR | Status: AC
Start: 2020-06-29 — End: 2020-06-29
  Administered 2020-06-29: 60 mg via INTRAMUSCULAR

## 2020-06-29 MED ORDER — AMOXICILLIN-POT CLAVULANATE 875-125 MG PO TABS: 1.0000 | ORAL_TABLET | Freq: Two times a day (BID) | ORAL | 0 refills | Status: DC

## 2020-06-29 NOTE — Patient Instructions (Signed)
Rocephin 1 gm injection given in office Kenalog 60 mg injection given in office Obtain stat chest x-ray at [facility] Take Mucinex twice daily for cough Take Augmentin 875 mg twice daily for 10 days Return to office for evaluation tomorrow Seek emergency medical care at closest hospital if conditions worsen before tomorrow Use Breztri inhaler daily, rinse mouth after each use  Community-Acquired Pneumonia, Adult Pneumonia is an infection of the lungs. It causes irritation and swelling in the airways of the lungs. Mucus and fluid may also build up inside the airways. This may cause coughing and trouble breathing. One type of pneumonia can happen while you are in a hospital. A different type can happen when you are not in a hospital (community-acquired pneumonia). What are the causes? This condition is caused by germs (viruses, bacteria, or fungi). Some types of germs can spread from person to person. Pneumonia is not thought to spread from person to person.   What increases the risk? You are more likely to develop this condition if:  You have a long-term (chronic) disease, such as: ? Disease of the lungs. This may be chronic obstructive pulmonary disease (COPD) or asthma. ? Heart failure. ? Cystic fibrosis. ? Diabetes. ? Kidney disease. ? Sickle cell disease. ? HIV.  You have other health problems, such as: ? Your body's defense system (immune system) is weak. ? A condition that may cause you to breathe in fluids from your mouth and nose.  You had your spleen taken out.  You do not take good care of your teeth and mouth (poor dental hygiene).  You use or have used tobacco products.  You travel where the germs that cause this illness are common.  You are near certain animals or the places they live.  You are older than 85 years of age. What are the signs or symptoms? Symptoms of this condition include:  A cough.  A fever.  Sweating or chills.  Chest pain, often  when you breathe deeply or cough.  Breathing problems, such as: ? Fast breathing. ? Trouble breathing. ? Shortness of breath.  Feeling tired (fatigued).  Muscle aches. How is this treated? Treatment for this condition depends on many things, such as:  The cause of your illness.  Your medicines.  Your other health problems. Most adults can be treated at home. Sometimes, treatment must happen in a hospital.  Treatment may include medicines to kill germs.  Medicines may depend on which germ caused your illness. Very bad pneumonia is rare. If you get it, you may:  Have a machine to help you breathe.  Have fluid taken away from around your lungs. Follow these instructions at home: Medicines  Take over-the-counter and prescription medicines only as told by your doctor.  Take cough medicine only if you are losing sleep. Cough medicine can keep your body from taking mucus away from your lungs.  If you were prescribed an antibiotic medicine, take it as told by your doctor. Do not stop taking the antibiotic even if you start to feel better. Lifestyle  Do not drink alcohol.  Do not use any products that contain nicotine or tobacco, such as cigarettes, e-cigarettes, and chewing tobacco. If you need help quitting, ask your doctor.  Eat a healthy diet. This includes a lot of vegetables, fruits, whole grains, low-fat dairy products, and low-fat (lean) protein.      General instructions  Rest a lot. Sleep for at least 8 hours each night.  Sleep with your  head and neck raised. Put a few pillows under your head or sleep in a reclining chair.  Return to your normal activities as told by your doctor. Ask your doctor what activities are safe for you.  Drink enough fluid to keep your pee (urine) pale yellow.  If your throat is sore, rinse your mouth often with salt water. To make salt water, dissolve -1 tsp (3-6 g) of salt in 1 cup (237 mL) of warm water.  Keep all follow-up  visits as told by your doctor. This is important.   How is this prevented? You can lower your risk of pneumonia by:  Getting the pneumonia shot (vaccine). These shots have different types and schedules. Ask your doctor what works best for you. Think about getting this shot if: ? You are older than 85 years of age. ? You are 83-34 years of age and:  You are being treated for cancer.  You have long-term lung disease.  You have other problems that affect your body's defense system. Ask your doctor if you have one of these.  Getting your flu shot every year. Ask your doctor which type of shot is best for you.  Going to the dentist as often as told.  Washing your hands often with soap and water for at least 20 seconds. If you cannot use soap and water, use hand sanitizer. Contact a doctor if:  You have a fever.  You lose sleep because your cough medicine does not help. Get help right away if:  You are short of breath and this gets worse.  You have more chest pain.  Your sickness gets worse. This is very serious if: ? You are an older adult. ? Your body's defense system is weak.  You cough up blood. These symptoms may be an emergency. Do not wait to see if the symptoms will go away. Get medical help right away. Call your local emergency services (911 in the [place] Do not drive yourself to the hospital. Summary  Pneumonia is an infection of the lungs.  Community-acquired pneumonia affects people who have not been in the hospital. Certain germs can cause this infection.  This condition may be treated with medicines that kill germs.  For very bad pneumonia, you may need a hospital stay and treatment to help with breathing. This information is not intended to replace advice given to you by your health care provider. Make sure you discuss any questions you have with your health care provider. Document Revised: 11/19/2018 Document Reviewed: 11/19/2018 Elsevier Patient Education   2021 [employer].

## 2020-06-29 NOTE — Progress Notes (Signed)
Acute Office Visit  Subjective:    Patient ID: Leah Pittman, female    DOB: November 23, 1930, 87 y.o.   MRN: [id number]  Chief Complaint  Patient presents with  . Cough        HPI Patient is in today for sinus congestion, cough, wheezing, and fever. Onset of symptoms was 5-days ago. Treatment has included Tylenol.She states she has a history of allergic rhinitis. She tells me she was outdoors, exposed to pollen prior to onset of symptoms. She denies exposure to any known ill-contacts. She has not received COVID-19 or seasonal flu vaccines.   Past Medical History:  Diagnosis Date  . Anxiety   . Arthritis   . Chest pain 2011   CARDIOLITE - no significant symptoms, EKG changes, or arrhythmias  . Congenital prolapse of bladder mucosa   . Diverticulosis   . Dyspnea 02/16/2012   2D ECHO - EF 55-60%, normal  . Esophageal dysphagia   . Fatigue   . GERD (gastroesophageal reflux disease)   . Glaucoma   . Headache(784.0)   . Heart attack (HCC)   . Hiatal hernia   . High blood pressure 02/16/2012   RENAL DOPPLER - normal  . Hyperlipidemia   . Hypothyroidism   . Labile hypertension   . Lightheadedness   . Migraine headache   . Multiple allergies   . Myalgia   . OSA (obstructive sleep apnea)   . Pain, lower leg    Calf  . Problem of menstruation   . Proteinuria   . Reflux   . SOB (shortness of breath)   . Thyroid disease   . Urinary problem   . Valvular heart disease   . Vitamin B12 deficiency   . Vitamin D deficiency     Past Surgical History:  Procedure Laterality Date  . ABDOMINAL HYSTERECTOMY  1976  . CHOLECYSTECTOMY  2000  . COLONOSCOPY  07/27/2014   Moderate predominantly sigmoid divertuculosis. Otherwise normal colonoscopy  . CORONARY ARTERY BYPASS GRAFT N/A 07/10/2018   Procedure: CORONARY ARTERY BYPASS GRAFTING (CABG), FREE LIMA;  Surgeon: [name], MD;  Location: [facility] OR;  Service: Open Heart Surgery;  Laterality: N/A;  . CYST REMOVAL NECK    .  CYSTECTOMY  1974   Intestine  . CYSTECTOMY  1996   Brain stem  . ESOPHAGOGASTRODUODENOSCOPY  12/07/2016   Schatzki's ring status post esophageal dilatation. Small hiatal hernia. Mild gastritis  . EYE SURGERY  2003  . EYE SURGERY  07/2016  . LEFT HEART CATH AND CORONARY ANGIOGRAPHY N/A 07/08/2018   Procedure: LEFT HEART CATH AND CORONARY ANGIOGRAPHY;  Surgeon: Runell Gess, MD;  Location: [facility] INVASIVE CV LAB;  Service: Cardiovascular;  Laterality: N/A;  . LEFT HEART CATH AND CORONARY ANGIOGRAPHY N/A 07/25/2019   Procedure: LEFT HEART CATH AND CORONARY ANGIOGRAPHY;  Surgeon: Kathleene Hazel, MD;  Location: [facility] INVASIVE CV LAB;  Service: Cardiovascular;  Laterality: N/A;  . MOUTH SURGERY  2013  . RADIOLOGY WITH ANESTHESIA N/A 11/05/2019   Procedure: MRI WITH ANESTHESIA;  Surgeon: Radiologist, Medication, MD;  Location: [facility] OR;  Service: Radiology;  Laterality: N/A;  . TEE WITHOUT CARDIOVERSION N/A 07/10/2018   Procedure: TRANSESOPHAGEAL ECHOCARDIOGRAM (TEE);  Surgeon: [name], MD;  Location: [facility] OR;  Service: Open Heart Surgery;  Laterality: N/A;  . TONSILLECTOMY     age 87    Family History  Problem Relation Age of Onset  . Heart attack Father   . Stroke Father   .  Cancer Sister   . Cancer Brother   . Asthma Brother   . Cancer Brother     Social History   Socioeconomic History  . Marital status: Married    Spouse name: Not on file  . Number of children: 3  . Years of education: college  . Highest education level: Not on file  Occupational History  . Occupation: Retired  Tobacco Use  . Smoking status: Never Smoker  . Smokeless tobacco: Never Used  Vaping Use  . Vaping Use: Never used  Substance and Sexual Activity  . Alcohol use: No  . Drug use: No  . Sexual activity: Not on file  Other Topics Concern  . Not on file  Social History Narrative   Lives with husband in [place], [place]    Outpatient Medications Prior to Visit  Medication Sig Dispense Refill   . acetaminophen (TYLENOL) 325 MG tablet Take 975 mg by mouth every 8 (eight) hours as needed for mild pain or headache.     Marland Kitchen aspirin 81 MG tablet Take 81 mg by mouth daily.    . Calcium Citrate-Vitamin D (CALCIUM CITRATE+D3 PETITES PO) Take 1 tablet by mouth daily.    . dorzolamide-timolol (COSOPT) 22.3-6.8 MG/ML ophthalmic solution Place 1 drop into the left eye 2 (two) times daily.     Marland Kitchen latanoprost (XALATAN) 0.005 % ophthalmic solution Place 1 drop into both eyes at bedtime.     Marland Kitchen levothyroxine (SYNTHROID) 100 MCG tablet Take 1 tablet (100 mcg total) by mouth daily before breakfast. 90 tablet 1  . losartan (COZAAR) 100 MG tablet Take 1 tablet (100 mg total) by mouth daily. (Patient taking differently: Take 25 mg by mouth daily. Patient reports taking 25 mg daily.) 30 tablet 1  . magnesium oxide (MAG-OX) 400 MG tablet Take 400 mg by mouth daily. Chewable    . nitroGLYCERIN (NITROSTAT) 0.4 MG SL tablet Place 0.4 mg under the tongue every 5 (five) minutes as needed for chest pain.     Marland Kitchen ondansetron (ZOFRAN) 8 MG tablet TAKE 1 TABLET (8 MG TOTAL) BY MOUTH 2 (TWO) TIMES DAILY AS NEEDED FOR NAUSEA OR VOMITING. 40 tablet 1  . pantoprazole (PROTONIX) 40 MG tablet Take 1 tablet (40 mg total) by mouth 2 (two) times daily. 180 tablet 1  . potassium chloride SA (KLOR-CON) 20 MEQ tablet Take 20 mEq by mouth daily.    . Probiotic Product (PROBIOTIC-10 PO) Take 1 tablet by mouth daily in the afternoon.    Orlie Dakin Sodium (SENNA PLUS PO) Take 1 tablet by mouth as directed.    . simvastatin (ZOCOR) 20 MG tablet TAKE 1 TABLET BY MOUTH EVERYDAY AT BEDTIME 90 tablet 0  . Vitamin D, Cholecalciferol, 50 MCG (2000 UT) CAPS Take 1 tablet by mouth daily.    . XELPROS 0.005 % EMUL Place 1 drop into the left eye at bedtime.     No facility-administered medications prior to visit.    Allergies  Allergen Reactions  . Benadryl [Diphenhydramine] Swelling and Other (See Comments)    Tongue swells and  hallucinates- could not talk  . Lipase Concentrate-Hp [Digestive Enzymes] Rash  . Zenpep [Pancrelipase (Lip-Prot-Amyl)] Rash  . Codeine Nausea And Vomiting and Hypertension  . Meperidine Nausea Only, Swelling and Other (See Comments)    Tongue swells and "I feel like death"  . Other Other (See Comments)    Tears the skin  . Prednisone     Hypertension   . Promethazine Other (See Comments)  Hypotension   . Propranolol Nausea And Vomiting  . Shellfish Allergy Nausea And Vomiting  . Tape Other (See Comments)    Tears the skin  . Tazarotene Other (See Comments)    Reaction not recalled   . Iodinated Diagnostic Agents Nausea Only and Rash    Review of Systems  Constitutional: Positive for fever. Negative for appetite change, fatigue and unexpected weight change.  HENT: Negative for congestion, ear pain, rhinorrhea, sinus pressure, sinus pain and tinnitus.   Eyes: Negative for pain.  Respiratory: Positive for cough and shortness of breath.   Cardiovascular: Negative for chest pain, palpitations and leg swelling.  Gastrointestinal: Negative for abdominal pain, constipation, diarrhea, nausea and vomiting.  Endocrine: Negative for cold intolerance, heat intolerance, polydipsia, polyphagia and polyuria.  Genitourinary: Negative for dysuria, frequency and hematuria.  Musculoskeletal: Positive for arthralgias, back pain and myalgias. Negative for joint swelling.  Skin: Negative for rash.  Allergic/Immunologic: Positive for environmental allergies.  Neurological: Negative for dizziness and headaches.  Hematological: Negative for adenopathy.  Psychiatric/Behavioral: Negative for decreased concentration and sleep disturbance. The patient is not nervous/anxious.        Objective:    Physical Exam Vitals reviewed.  Constitutional:      Appearance: Normal appearance.  HENT:     Head: Normocephalic.     Right Ear: Tympanic membrane normal.     Left Ear: Tympanic membrane normal.      Nose: Congestion and rhinorrhea present.     Mouth/Throat:     Mouth: Mucous membranes are dry.     Pharynx: Posterior oropharyngeal erythema present.  Eyes:     Pupils: Pupils are equal, round, and reactive to light.  Cardiovascular:     Rate and Rhythm: Normal rate and regular rhythm.     Pulses: Normal pulses.     Heart sounds: Normal heart sounds.  Pulmonary:     Effort: Pulmonary effort is normal.     Breath sounds: Wheezing and rhonchi present.  Abdominal:     General: Bowel sounds are normal.     Palpations: Abdomen is soft.  Musculoskeletal:        General: Normal range of motion.     Cervical back: Neck supple.  Skin:    General: Skin is warm and dry.     Capillary Refill: Capillary refill takes less than 2 seconds.  Neurological:     General: No focal deficit present.     Mental Status: She is alert and oriented to person, place, and time.  Psychiatric:        Mood and Affect: Mood normal.        Behavior: Behavior normal.     BP (!) 150/62 (BP Location: Left Arm, Patient Position: Sitting)   Pulse 72   Temp 97.9 F (36.6 C) (Temporal)   Ht $R'5\' 4"'CF$  (1.626 m)   Wt 121 lb (54.9 kg)   SpO2 100%   BMI 20.77 kg/m  Wt Readings from Last 3 Encounters:  06/29/20 121 lb (54.9 kg)  06/14/20 123 lb 9.6 oz (56.1 kg)  06/01/20 122 lb (55.3 kg)    Health Maintenance Due  Topic Date Due  . COVID-19 Vaccine (1) Never done  . PNA vac Low Risk Adult (1 of 2 - PCV13) Never done      Lab Results  Component Value Date   TSH 4.450 06/14/2020   Lab Results  Component Value Date   WBC 6.5 06/14/2020   HGB 12.7 06/14/2020   HCT  37.3 06/14/2020   MCV 93 06/14/2020   PLT 230 06/14/2020   Lab Results  Component Value Date   NA 139 06/14/2020   K 4.1 06/14/2020   CO2 20 06/14/2020   GLUCOSE 101 (H) 06/14/2020   BUN 16 06/14/2020   CREATININE 0.88 06/14/2020   BILITOT 0.4 06/14/2020   ALKPHOS 73 06/14/2020   AST 13 06/14/2020   ALT 16 06/14/2020   PROT 6.8  06/14/2020   ALBUMIN 4.6 06/14/2020   CALCIUM 10.0 06/14/2020   ANIONGAP 8 11/05/2019   EGFR 64 06/14/2020   GFR 60.94 01/27/2020   Lab Results  Component Value Date   CHOL 250 (H) 06/14/2020   Lab Results  Component Value Date   HDL 65 06/14/2020   Lab Results  Component Value Date   LDLCALC 163 (H) 06/14/2020   Lab Results  Component Value Date   TRIG 125 06/14/2020   Lab Results  Component Value Date   CHOLHDL 3.8 06/14/2020   Lab Results  Component Value Date   HGBA1C 5.8 (H) 07/10/2018       Assessment & Plan:    1. Chronic obstructive pulmonary disease with acute exacerbation (HCC) - cefTRIAXone (ROCEPHIN) injection 1 g - amoxicillin-clavulanate (AUGMENTIN) 875-125 MG tablet; Take 1 tablet by mouth 2 (two) times daily.  Dispense: 20 tablet; Refill: 0 - predniSONE (DELTASONE) 10 MG tablet; Take 1 tablet (10 mg total) by mouth daily with breakfast.  Dispense: 20 tablet; Refill: 0 - triamcinolone acetonide (KENALOG-40) injection 60 mg  2. Cough -Mucinex twice daily   3. Fever, unspecified fever cause - POC COVID-19 BinaxNow     Rocephin 1 gm injection given in office Kenalog 60 mg injection given in office Obtain stat chest x-ray at [facility] Take Mucinex twice daily for cough Take Augmentin 875 mg twice daily for 10 days Return to office for evaluation tomorrow Seek emergency medical care at closest hospital if conditions worsen before tomorrow Use Breztri inhaler daily, rinse mouth after each use   Signed, Rip Harbour, NP

## 2020-06-30 ENCOUNTER — Encounter: Payer: Self-pay | Admitting: Nurse Practitioner

## 2020-06-30 ENCOUNTER — Other Ambulatory Visit: Payer: Self-pay

## 2020-06-30 ENCOUNTER — Ambulatory Visit (INDEPENDENT_AMBULATORY_CARE_PROVIDER_SITE_OTHER): Payer: Medicare Other | Admitting: Nurse Practitioner

## 2020-06-30 VITALS — BP 142/62 | HR 66 | Temp 97.9°F | Ht 64.0 in | Wt 122.0 lb

## 2020-06-30 DIAGNOSIS — R059 Cough, unspecified: Secondary | ICD-10-CM

## 2020-06-30 DIAGNOSIS — J0181 Other acute recurrent sinusitis: Secondary | ICD-10-CM | POA: Diagnosis not present

## 2020-06-30 DIAGNOSIS — J441 Chronic obstructive pulmonary disease with (acute) exacerbation: Secondary | ICD-10-CM | POA: Diagnosis not present

## 2020-06-30 MED ORDER — BENZONATATE 100 MG PO CAPS: 100.0000 mg | ORAL_CAPSULE | Freq: Two times a day (BID) | ORAL | 0 refills | Status: DC | PRN

## 2020-06-30 NOTE — Progress Notes (Signed)
Acute Office Visit  Subjective:    Patient ID: Leah Pittman, female    DOB: 02-04-1931, 87 y.o.   MRN: [id number]  Chief Complaint  Patient presents with  . Pneumonia    Follow up    HPI Patient is in today for follow-up of COPD exacerbation and sinusitis. Onset was 6-days ago. She was seen in the office yesterday. She was quite ill. She was treated in office with Rocephin 1 gm and Kenalog 60 mg IM injections. She was prescribed Prednisone 10 mg BID, Augmentin 875 BID, and given Breztri inhaler sample. Stat chest x-ray performed yesterday negative for acute cardiopulmonary disease. COVID-19 test negative yesterday. She was given Mucinex samples for cough but states she had side effects of elevated blood pressure and dizziness. She has discontinued Mucinex.   Past Medical History:  Diagnosis Date  . Anxiety   . Arthritis   . Chest pain 2011   CARDIOLITE - no significant symptoms, EKG changes, or arrhythmias  . Congenital prolapse of bladder mucosa   . Diverticulosis   . Dyspnea 02/16/2012   2D ECHO - EF 55-60%, normal  . Esophageal dysphagia   . Fatigue   . GERD (gastroesophageal reflux disease)   . Glaucoma   . Headache(784.0)   . Heart attack (HCC)   . Hiatal hernia   . High blood pressure 02/16/2012   RENAL DOPPLER - normal  . Hyperlipidemia   . Hypothyroidism   . Labile hypertension   . Lightheadedness   . Migraine headache   . Multiple allergies   . Myalgia   . OSA (obstructive sleep apnea)   . Pain, lower leg    Calf  . Problem of menstruation   . Proteinuria   . Reflux   . SOB (shortness of breath)   . Thyroid disease   . Urinary problem   . Valvular heart disease   . Vitamin B12 deficiency   . Vitamin D deficiency     Past Surgical History:  Procedure Laterality Date  . ABDOMINAL HYSTERECTOMY  1976  . CHOLECYSTECTOMY  2000  . COLONOSCOPY  07/27/2014   Moderate predominantly sigmoid divertuculosis. Otherwise normal colonoscopy  . CORONARY  ARTERY BYPASS GRAFT N/A 07/10/2018   Procedure: CORONARY ARTERY BYPASS GRAFTING (CABG), FREE LIMA;  Surgeon: [name], MD;  Location: [facility] OR;  Service: Open Heart Surgery;  Laterality: N/A;  . CYST REMOVAL NECK    . CYSTECTOMY  1974   Intestine  . CYSTECTOMY  1996   Brain stem  . ESOPHAGOGASTRODUODENOSCOPY  12/07/2016   Schatzki's ring status post esophageal dilatation. Small hiatal hernia. Mild gastritis  . EYE SURGERY  2003  . EYE SURGERY  07/2016  . LEFT HEART CATH AND CORONARY ANGIOGRAPHY N/A 07/08/2018   Procedure: LEFT HEART CATH AND CORONARY ANGIOGRAPHY;  Surgeon: Runell Gess, MD;  Location: [facility];  Service: Cardiovascular;  Laterality: N/A;  . LEFT HEART CATH AND CORONARY ANGIOGRAPHY N/A 07/25/2019   Procedure: LEFT HEART CATH AND CORONARY ANGIOGRAPHY;  Surgeon: Kathleene Hazel, MD;  Location: [facility];  Service: Cardiovascular;  Laterality: N/A;  . MOUTH SURGERY  2013  . RADIOLOGY WITH ANESTHESIA N/A 11/05/2019   Procedure: MRI WITH ANESTHESIA;  Surgeon: Radiologist, Medication, MD;  Location: [facility] OR;  Service: Radiology;  Laterality: N/A;  . TEE WITHOUT CARDIOVERSION N/A 07/10/2018   Procedure: TRANSESOPHAGEAL ECHOCARDIOGRAM (TEE);  Surgeon: [name], MD;  Location[facility];  Service: Open Heart Surgery;  Laterality: N/A;  . TONSILLECTOMY     age 87    Family History  Problem Relation Age of Onset  . Heart attack Father   . Stroke Father   . Cancer Sister   . Cancer Brother   . Asthma Brother   . Cancer Brother     Social History   Socioeconomic History  . Marital status: Married    Spouse name: Not on file  . Number of children: 3  . Years of education: college  . Highest education level: Not on file  Occupational History  . Occupation: Retired  Tobacco Use  . Smoking status: Never Smoker  . Smokeless tobacco: Never Used  Vaping Use  . Vaping Use: Never used  Substance and Sexual Activity  . Alcohol use: No  . Drug  use: No  . Sexual activity: Not on file  Other Topics Concern  . Not on file  Social History Narrative   Lives with husband in [place], [place]    Outpatient Medications Prior to Visit  Medication Sig Dispense Refill  . acetaminophen (TYLENOL) 325 MG tablet Take 975 mg by mouth every 8 (eight) hours as needed for mild pain or headache.     Marland Kitchen amoxicillin-clavulanate (AUGMENTIN) 875-125 MG tablet Take 1 tablet by mouth 2 (two) times daily. 20 tablet 0  . aspirin 81 MG tablet Take 81 mg by mouth daily.    . Calcium Citrate-Vitamin D (CALCIUM CITRATE+D3 PETITES PO) Take 1 tablet by mouth daily.    . dorzolamide-timolol (COSOPT) 22.3-6.8 MG/ML ophthalmic solution Place 1 drop into the left eye 2 (two) times daily.     Marland Kitchen latanoprost (XALATAN) 0.005 % ophthalmic solution Place 1 drop into both eyes at bedtime.     Marland Kitchen levothyroxine (SYNTHROID) 100 MCG tablet Take 1 tablet (100 mcg total) by mouth daily before breakfast. 90 tablet 1  . losartan (COZAAR) 100 MG tablet Take 1 tablet (100 mg total) by mouth daily. (Patient taking differently: Take 25 mg by mouth daily. Patient reports taking 25 mg daily.) 30 tablet 1  . magnesium oxide (MAG-OX) 400 MG tablet Take 400 mg by mouth daily. Chewable    . nitroGLYCERIN (NITROSTAT) 0.4 MG SL tablet Place 0.4 mg under the tongue every 5 (five) minutes as needed for chest pain.     Marland Kitchen ondansetron (ZOFRAN) 8 MG tablet TAKE 1 TABLET (8 MG TOTAL) BY MOUTH 2 (TWO) TIMES DAILY AS NEEDED FOR NAUSEA OR VOMITING. 40 tablet 1  . pantoprazole (PROTONIX) 40 MG tablet Take 1 tablet (40 mg total) by mouth 2 (two) times daily. 180 tablet 1  . potassium chloride SA (KLOR-CON) 20 MEQ tablet Take 20 mEq by mouth daily.    . predniSONE (DELTASONE) 10 MG tablet Take 1 tablet (10 mg total) by mouth daily with breakfast. 20 tablet 0  . Probiotic Product (PROBIOTIC-10 PO) Take 1 tablet by mouth daily in the afternoon.    Orlie Dakin Sodium (SENNA PLUS PO) Take 1 tablet by mouth  as directed.    . simvastatin (ZOCOR) 20 MG tablet TAKE 1 TABLET BY MOUTH EVERYDAY AT BEDTIME 90 tablet 0  . Vitamin D, Cholecalciferol, 50 MCG (2000 UT) CAPS Take 1 tablet by mouth daily.    . XELPROS 0.005 % EMUL Place 1 drop into the left eye at bedtime.     No facility-administered medications prior to visit.    Allergies  Allergen Reactions  . Benadryl [Diphenhydramine] Swelling and Other (See Comments)  Tongue swells and hallucinates- could not talk  . Lipase Concentrate-Hp [Digestive Enzymes] Rash  . Zenpep [Pancrelipase (Lip-Prot-Amyl)] Rash  . Codeine Nausea And Vomiting and Hypertension  . Meperidine Nausea Only, Swelling and Other (See Comments)    Tongue swells and "I feel like death"  . Other Other (See Comments)    Tears the skin  . Prednisone     Hypertension   . Promethazine Other (See Comments)    Hypotension   . Propranolol Nausea And Vomiting  . Shellfish Allergy Nausea And Vomiting  . Tape Other (See Comments)    Tears the skin  . Tazarotene Other (See Comments)    Reaction not recalled   . Iodinated Diagnostic Agents Nausea Only and Rash    Review of Systems  Constitutional: Positive for fatigue. Negative for appetite change and unexpected weight change.  HENT: Positive for congestion, ear pain (bilateral fullness), rhinorrhea, sinus pressure, sneezing and sore throat. Negative for tinnitus.   Eyes: Negative for pain.  Respiratory: Positive for cough, shortness of breath and wheezing.   Cardiovascular: Negative for chest pain, palpitations and leg swelling.  Gastrointestinal: Negative for abdominal pain, constipation, diarrhea, nausea and vomiting.  Endocrine: Negative for cold intolerance, heat intolerance, polydipsia, polyphagia and polyuria.  Genitourinary: Negative for dysuria, frequency and hematuria.  Musculoskeletal: Negative for arthralgias, back pain, joint swelling and myalgias.  Skin: Negative for rash.  Allergic/Immunologic: Negative for  environmental allergies.  Neurological: Positive for dizziness and weakness. Negative for headaches.  Hematological: Negative for adenopathy.  Psychiatric/Behavioral: Negative for decreased concentration and sleep disturbance. The patient is not nervous/anxious.        Objective:    Physical Exam Vitals reviewed.  Constitutional:      Appearance: Normal appearance.  HENT:     Head: Normocephalic.     Nose: Congestion and rhinorrhea present.  Cardiovascular:     Rate and Rhythm: Normal rate and regular rhythm.     Pulses: Normal pulses.     Heart sounds: Normal heart sounds.  Pulmonary:     Effort: Pulmonary effort is normal.     Breath sounds: Wheezing (expiratory, all lung fields) present.  Skin:    General: Skin is warm and dry.     Capillary Refill: Capillary refill takes less than 2 seconds.  Neurological:     General: No focal deficit present.     Mental Status: She is alert and oriented to person, place, and time.  Psychiatric:        Mood and Affect: Mood normal.        Behavior: Behavior normal.     BP (!) 142/62 (BP Location: Left Arm, Patient Position: Sitting)   Pulse 66   Temp 97.9 F (36.6 C) (Temporal)   Ht $R'5\' 4"'iV$  (1.626 m)   Wt 122 lb (55.3 kg)   SpO2 98%   BMI 20.94 kg/m  Wt Readings from Last 3 Encounters:  06/30/20 122 lb (55.3 kg)  06/29/20 121 lb (54.9 kg)  06/14/20 123 lb 9.6 oz (56.1 kg)    Health Maintenance Due  Topic Date Due  . COVID-19 Vaccine (1) Never done  . PNA vac Low Risk Adult (1 of 2 - PCV13) Never done       Lab Results  Component Value Date   TSH 4.450 06/14/2020   Lab Results  Component Value Date   WBC 6.5 06/14/2020   HGB 12.7 06/14/2020   HCT 37.3 06/14/2020   MCV 93 06/14/2020   PLT  230 06/14/2020   Lab Results  Component Value Date   NA 139 06/14/2020   K 4.1 06/14/2020   CO2 20 06/14/2020   GLUCOSE 101 (H) 06/14/2020   BUN 16 06/14/2020   CREATININE 0.88 06/14/2020   BILITOT 0.4 06/14/2020    ALKPHOS 73 06/14/2020   AST 13 06/14/2020   ALT 16 06/14/2020   PROT 6.8 06/14/2020   ALBUMIN 4.6 06/14/2020   CALCIUM 10.0 06/14/2020   ANIONGAP 8 11/05/2019   EGFR 64 06/14/2020   GFR 60.94 01/27/2020   Lab Results  Component Value Date   CHOL 250 (H) 06/14/2020   Lab Results  Component Value Date   HDL 65 06/14/2020   Lab Results  Component Value Date   LDLCALC 163 (H) 06/14/2020   Lab Results  Component Value Date   TRIG 125 06/14/2020   Lab Results  Component Value Date   CHOLHDL 3.8 06/14/2020   Lab Results  Component Value Date   HGBA1C 5.8 (H) 07/10/2018       Assessment & Plan:  1. Other acute recurrent sinusitis - benzonatate (TESSALON) 100 MG capsule; Take 1 capsule (100 mg total) by mouth 2 (two) times daily as needed for cough.  Dispense: 20 capsule; Refill: 0 -Zyrtec 10 mg po daily, samples given  2. Cough - benzonatate (TESSALON) 100 MG capsule; Take 1 capsule (100 mg total) by mouth 2 (two) times daily as needed for cough.  Dispense: 20 capsule; Refill: 0 -rest and push fluids, especially water  3. COPD exacerbation (HCC) - benzonatate (TESSALON) 100 MG capsule; Take 1 capsule (100 mg total) by mouth 2 (two) times daily as needed for cough.  Dispense: 20 capsule; Refill: 0 -continue Breztri inhaler (rinse mouth afterwards) -continue Augmentin 875 mg BID -continue Prednisone 10 mg BID   Stop Mucinex Begin Zyrtec 10 mg daily Push fluids, especially water Continue Prednisone, Augmentin antibiotics, and inhaler (rinse mouth afterwards) Take Tessalon perles as needed for cough twice daily Follow-up as needed  Follow-up: As needed  Signed, Rip Harbour, NP

## 2020-06-30 NOTE — Patient Instructions (Addendum)
Stop Mucinex Begin Zyrtec 10 mg daily Push fluids, especially water Continue Prednisone, Augmentin antibiotics, and inhaler (rinse mouth afterwards) Take Tessalon perles as needed for cough twice daily Follow-up as needed  Sinusitis, Adult Sinusitis is soreness and swelling (inflammation) of your sinuses. Sinuses are hollow spaces in the bones around your face. They are located:  Around your eyes.  In the middle of your forehead.  Behind your nose.  In your cheekbones. Your sinuses and nasal passages are lined with a fluid called mucus. Mucus drains out of your sinuses. Swelling can trap mucus in your sinuses. This lets germs (bacteria, virus, or fungus) grow, which leads to infection. Most of the time, this condition is caused by a virus. What are the causes? This condition is caused by:  Allergies.  Asthma.  Germs.  Things that block your nose or sinuses.  Growths in the nose (nasal polyps).  Chemicals or irritants in the air.  Fungus (rare). What increases the risk? You are more likely to develop this condition if:  You have a weak body defense system (immune system).  You do a lot of swimming or diving.  You use nasal sprays too much.  You smoke. What are the signs or symptoms? The main symptoms of this condition are pain and a feeling of pressure around the sinuses. Other symptoms include:  Stuffy nose (congestion).  Runny nose (drainage).  Swelling and warmth in the sinuses.  Headache.  Toothache.  A cough that may get worse at night.  Mucus that collects in the throat or the back of the nose (postnasal drip).  Being unable to smell and taste.  Being very tired (fatigue).  A fever.  Sore throat.  Bad breath. How is this diagnosed? This condition is diagnosed based on:  Your symptoms.  Your medical history.  A physical exam.  Tests to find out if your condition is short-term (acute) or long-term (chronic). Your doctor may: ? Check  your nose for growths (polyps). ? Check your sinuses using a tool that has a light (endoscope). ? Check for allergies or germs. ? Do imaging tests, such as an MRI or CT scan. How is this treated? Treatment for this condition depends on the cause and whether it is short-term or long-term.  If caused by a virus, your symptoms should go away on their own within 10 days. You may be given medicines to relieve symptoms. They include: ? Medicines that shrink swollen tissue in the nose. ? Medicines that treat allergies (antihistamines). ? A spray that treats swelling of the nostrils. ? Rinses that help get rid of thick mucus in your nose (nasal saline washes).  If caused by bacteria, your doctor may wait to see if you will get better without treatment. You may be given antibiotic medicine if you have: ? A very bad infection. ? A weak body defense system.  If caused by growths in the nose, you may need to have surgery. Follow these instructions at home: Medicines  Take, use, or apply over-the-counter and prescription medicines only as told by your doctor. These may include nasal sprays.  If you were prescribed an antibiotic medicine, take it as told by your doctor. Do not stop taking the antibiotic even if you start to feel better. Hydrate and humidify  Drink enough water to keep your pee (urine) pale yellow.  Use a cool mist humidifier to keep the humidity level in your home above 50%.  Breathe in steam for 10-15 minutes, 3-4 times  a day, or as told by your doctor. You can do this in the bathroom while a hot shower is running.  Try not to spend time in cool or dry air.   Rest  Rest as much as you can.  Sleep with your head raised (elevated).  Make sure you get enough sleep each night. General instructions  Put a warm, moist washcloth on your face 3-4 times a day, or as often as told by your doctor. This will help with discomfort.  Wash your hands often with soap and water. If  there is no soap and water, use hand sanitizer.  Do not smoke. Avoid being around people who are smoking (secondhand smoke).  Keep all follow-up visits as told by your doctor. This is important.   Contact a doctor if:  You have a fever.  Your symptoms get worse.  Your symptoms do not get better within 10 days. Get help right away if:  You have a very bad headache.  You cannot stop throwing up (vomiting).  You have very bad pain or swelling around your face or eyes.  You have trouble seeing.  You feel confused.  Your neck is stiff.  You have trouble breathing. Summary  Sinusitis is swelling of your sinuses. Sinuses are hollow spaces in the bones around your face.  This condition is caused by tissues in your nose that become inflamed or swollen. This traps germs. These can lead to infection.  If you were prescribed an antibiotic medicine, take it as told by your doctor. Do not stop taking it even if you start to feel better.  Keep all follow-up visits as told by your doctor. This is important. This information is not intended to replace advice given to you by your health care provider. Make sure you discuss any questions you have with your health care provider. Document Revised: 07/09/2017 Document Reviewed: 07/09/2017 Elsevier Patient Education  2021 [employer]   Chronic Obstructive Pulmonary Disease  Chronic obstructive pulmonary disease (COPD) is a long-term (chronic) condition that affects the lungs. COPD is a general term that can be used to describe many different lung problems that cause lung swelling (inflammation) and limit airflow, including chronic bronchitis and emphysema. If you have COPD, your lung function will probably never return to normal. In most cases, it gets worse over time. However, there are steps you can take to slow the progression of the disease and improve your quality of life. What are the causes? This condition may be caused  by:  Smoking. This is the most common cause.  Certain genes passed down through families. What increases the risk? The following factors may make you more likely to develop this condition:  Secondhand smoke from cigarettes, pipes, or cigars.  Exposure to chemicals and other irritants such as fumes and dust in the work environment.  Chronic lung conditions or infections. What are the signs or symptoms? Symptoms of this condition include:  Shortness of breath, especially during physical activity.  Chronic cough with a large amount of thick mucus. Sometimes the cough may not have any mucus (dry cough).  Wheezing.  Rapid breaths.  Gray or bluish discoloration (cyanosis) of the skin, especially in your fingers, toes, or lips.  Feeling tired (fatigue).  Weight loss.  Chest tightness.  Frequent infections.  Episodes when breathing symptoms become much worse (exacerbations).  Swelling in the ankles, feet, or legs. This may occur in later stages of the disease. How is this diagnosed? This condition is  diagnosed based on:  Your medical history.  A physical exam. You may also have tests, including:  Lung (pulmonary) function tests. This may include a spirometry test, which measures your ability to exhale properly.  Chest X-ray.  CT scan.  Blood tests. How is this treated? This condition may be treated with:  Medicines. These may include inhaled rescue medicines to treat acute exacerbations as well as long-term, or maintenance, medicines to prevent flare-ups of COPD. ? Bronchodilators help treat COPD by dilating the airways to allow increased airflow and make your breathing more comfortable. ? Steroids can reduce airway inflammation and help prevent exacerbations.  Smoking cessation. If you smoke, your health care provider may ask you to quit, and may also recommend therapy or replacement products to help you quit.  Pulmonary rehabilitation. This may involve working  with a team of health care providers and specialists, such as respiratory, occupational, and physical therapists.  Exercise and physical activity. These are beneficial for nearly all people with COPD.  Nutrition therapy to gain weight, if you are underweight.  Oxygen. Supplemental oxygen therapy is only helpful if you have a low oxygen level in your blood (hypoxemia).  Lung surgery or transplant.  Palliative care. This is to help people with COPD feel comfortable when treatment is no longer working. Follow these instructions at home: Medicines  Take over-the-counter and prescription medicines (inhaled or pills) only as told by your health care provider.  Talk to your health care provider before taking any cough or allergy medicines. You may need to avoid certain medicines that dry out your airways. Lifestyle  If you are a smoker, the most important thing that you can do is to stop smoking. Do not use any products that contain nicotine or tobacco, such as cigarettes and e-cigarettes. If you need help quitting, ask your health care provider. Continuing to smoke will cause the disease to progress faster.  Avoid exposure to things that irritate your lungs, such as smoke, chemicals, and fumes.  Stay active, but balance activity with periods of rest. Exercise and physical activity will help you maintain your ability to do things you want to do.  Learn and use relaxation techniques to manage stress and to control your breathing.  Get the right amount of sleep and get quality sleep. Most adults need 7 or more hours per night.  Eat healthy foods. Eating smaller, more frequent meals and resting before meals may help you maintain your strength. Controlled breathing Learn and use controlled breathing techniques as directed by your health care provider. Controlled breathing techniques include:  Pursed lip breathing. Start by breathing in (inhaling) through your nose for 1 second. Then, purse your  lips as if you were going to whistle and breathe out (exhale) through the pursed lips for 2 seconds.  Diaphragmatic breathing. Start by putting one hand on your abdomen just above your waist. Inhale slowly through your nose. The hand on your abdomen should move out. Then purse your lips and exhale slowly. You should be able to feel the hand on your abdomen moving in as you exhale. Controlled coughing Learn and use controlled coughing to clear mucus from your lungs. Controlled coughing is a series of short, progressive coughs. The steps of controlled coughing are: 1. Lean your head slightly forward. 2. Breathe in deeply using diaphragmatic breathing. 3. Try to hold your breath for 3 seconds. 4. Keep your mouth slightly open while coughing twice. 5. Spit any mucus out into a tissue. 6. Rest and  repeat the steps once or twice as needed. General instructions  Make sure you receive all the vaccines that your health care provider recommends, especially the pneumococcal and influenza vaccines. Preventing infection and hospitalization is very important when you have COPD.  Use oxygen therapy and pulmonary rehabilitation if directed to by your health care provider. If you require home oxygen therapy, ask your health care provider whether you should purchase a pulse oximeter to measure your oxygen level at home.  Work with your health care provider to develop a COPD action plan. This will help you know what steps to take if your condition gets worse.  Keep other chronic health conditions under control as told by your health care provider.  Avoid extreme temperature and humidity changes.  Avoid contact with people who have an illness that spreads from person to person (is contagious), such as viral infections or pneumonia.  Keep all follow-up visits as told by your health care provider. This is important. Contact a health care provider if:  You are coughing up more mucus than usual.  There is a  change in the color or thickness of your mucus.  Your breathing is more labored than usual.  Your breathing is faster than usual.  You have difficulty sleeping.  You need to use your rescue medicines or inhalers more often than expected.  You have trouble doing routine activities such as getting dressed or walking around the house. Get help right away if:  You have shortness of breath while you are resting.  You have shortness of breath that prevents you from: ? Being able to talk. ? Performing your usual physical activities.  You have chest pain lasting longer than 5 minutes.  Your skin color is more blue (cyanotic) than usual.  You measure low oxygen saturations for longer than 5 minutes with a pulse oximeter.  You have a fever.  You feel too tired to breathe normally. Summary  Chronic obstructive pulmonary disease (COPD) is a long-term (chronic) condition that affects the lungs.  Your lung function will probably never return to normal. In most cases, it gets worse over time. However, there are steps you can take to slow the progression of the disease and improve your quality of life.  Treatment for COPD may include taking medicines, quitting smoking, pulmonary rehabilitation, and changes to diet and exercise. As the disease progresses, you may need oxygen therapy, a lung transplant, or palliative care.  To help manage your condition, do not smoke, avoid exposure to things that irritate your lungs, stay up to date on all vaccines, and follow your health care provider's instructions for taking medicines. This information is not intended to replace advice given to you by your health care provider. Make sure you discuss any questions you have with your health care provider. Document Revised: 01/19/2017 Document Reviewed: 03/13/2016 Elsevier Patient Education  2021 [employer].

## 2020-07-06 ENCOUNTER — Other Ambulatory Visit: Payer: Self-pay | Admitting: Family Medicine

## 2020-07-06 DIAGNOSIS — N644 Mastodynia: Secondary | ICD-10-CM

## 2020-07-14 ENCOUNTER — Inpatient Hospital Stay (HOSPITAL_COMMUNITY)
Admission: EM | Admit: 2020-07-14 | Discharge: 2020-07-16 | DRG: 191 | Disposition: A | Discharge status: Home / Self Care | Payer: Medicare Other | Source: Ambulatory Visit | Attending: Family Medicine | Admitting: Family Medicine

## 2020-07-14 ENCOUNTER — Other Ambulatory Visit: Payer: Self-pay

## 2020-07-14 ENCOUNTER — Emergency Department (HOSPITAL_COMMUNITY): Payer: Medicare Other

## 2020-07-14 ENCOUNTER — Encounter (HOSPITAL_COMMUNITY): Payer: Self-pay | Admitting: *Deleted

## 2020-07-14 ENCOUNTER — Ambulatory Visit (INDEPENDENT_AMBULATORY_CARE_PROVIDER_SITE_OTHER): Payer: Medicare Other | Admitting: Family Medicine

## 2020-07-14 ENCOUNTER — Encounter: Payer: Self-pay | Admitting: Family Medicine

## 2020-07-14 VITALS — BP 130/78 | HR 71 | Temp 96.7°F | Ht 64.0 in | Wt 125.0 lb

## 2020-07-14 DIAGNOSIS — I10 Essential (primary) hypertension: Secondary | ICD-10-CM | POA: Diagnosis present

## 2020-07-14 DIAGNOSIS — Z951 Presence of aortocoronary bypass graft: Secondary | ICD-10-CM | POA: Diagnosis not present

## 2020-07-14 DIAGNOSIS — E7800 Pure hypercholesterolemia, unspecified: Secondary | ICD-10-CM | POA: Diagnosis present

## 2020-07-14 DIAGNOSIS — I161 Hypertensive emergency: Secondary | ICD-10-CM | POA: Diagnosis present

## 2020-07-14 DIAGNOSIS — E559 Vitamin D deficiency, unspecified: Secondary | ICD-10-CM | POA: Diagnosis present

## 2020-07-14 DIAGNOSIS — J45909 Unspecified asthma, uncomplicated: Secondary | ICD-10-CM | POA: Diagnosis present

## 2020-07-14 DIAGNOSIS — R778 Other specified abnormalities of plasma proteins: Secondary | ICD-10-CM | POA: Diagnosis present

## 2020-07-14 DIAGNOSIS — Z66 Do not resuscitate: Secondary | ICD-10-CM | POA: Diagnosis present

## 2020-07-14 DIAGNOSIS — R2681 Unsteadiness on feet: Secondary | ICD-10-CM | POA: Diagnosis present

## 2020-07-14 DIAGNOSIS — E039 Hypothyroidism, unspecified: Secondary | ICD-10-CM | POA: Diagnosis present

## 2020-07-14 DIAGNOSIS — I2510 Atherosclerotic heart disease of native coronary artery without angina pectoris: Secondary | ICD-10-CM | POA: Diagnosis present

## 2020-07-14 DIAGNOSIS — I5032 Chronic diastolic (congestive) heart failure: Secondary | ICD-10-CM | POA: Diagnosis present

## 2020-07-14 DIAGNOSIS — Z20822 Contact with and (suspected) exposure to covid-19: Secondary | ICD-10-CM | POA: Diagnosis present

## 2020-07-14 DIAGNOSIS — H548 Legal blindness, as defined in USA: Secondary | ICD-10-CM | POA: Diagnosis present

## 2020-07-14 DIAGNOSIS — Z885 Allergy status to narcotic agent status: Secondary | ICD-10-CM

## 2020-07-14 DIAGNOSIS — R072 Precordial pain: Secondary | ICD-10-CM | POA: Diagnosis not present

## 2020-07-14 DIAGNOSIS — I214 Non-ST elevation (NSTEMI) myocardial infarction: Secondary | ICD-10-CM | POA: Diagnosis present

## 2020-07-14 DIAGNOSIS — R531 Weakness: Secondary | ICD-10-CM | POA: Diagnosis not present

## 2020-07-14 DIAGNOSIS — R251 Tremor, unspecified: Secondary | ICD-10-CM | POA: Diagnosis present

## 2020-07-14 DIAGNOSIS — Z8701 Personal history of pneumonia (recurrent): Secondary | ICD-10-CM

## 2020-07-14 DIAGNOSIS — R0609 Other forms of dyspnea: Secondary | ICD-10-CM | POA: Diagnosis present

## 2020-07-14 DIAGNOSIS — J479 Bronchiectasis, uncomplicated: Principal | ICD-10-CM | POA: Diagnosis present

## 2020-07-14 DIAGNOSIS — R0602 Shortness of breath: Secondary | ICD-10-CM

## 2020-07-14 DIAGNOSIS — R29810 Facial weakness: Secondary | ICD-10-CM | POA: Diagnosis present

## 2020-07-14 DIAGNOSIS — Z8249 Family history of ischemic heart disease and other diseases of the circulatory system: Secondary | ICD-10-CM

## 2020-07-14 DIAGNOSIS — R9431 Abnormal electrocardiogram [ECG] [EKG]: Secondary | ICD-10-CM | POA: Diagnosis present

## 2020-07-14 DIAGNOSIS — Z9049 Acquired absence of other specified parts of digestive tract: Secondary | ICD-10-CM

## 2020-07-14 DIAGNOSIS — J440 Chronic obstructive pulmonary disease with acute lower respiratory infection: Secondary | ICD-10-CM | POA: Diagnosis present

## 2020-07-14 DIAGNOSIS — Z888 Allergy status to other drugs, medicaments and biological substances status: Secondary | ICD-10-CM

## 2020-07-14 DIAGNOSIS — Z79899 Other long term (current) drug therapy: Secondary | ICD-10-CM

## 2020-07-14 DIAGNOSIS — R1314 Dysphagia, pharyngoesophageal phase: Secondary | ICD-10-CM | POA: Diagnosis present

## 2020-07-14 DIAGNOSIS — I119 Hypertensive heart disease without heart failure: Secondary | ICD-10-CM | POA: Diagnosis present

## 2020-07-14 DIAGNOSIS — H401333 Pigmentary glaucoma, bilateral, severe stage: Secondary | ICD-10-CM | POA: Diagnosis present

## 2020-07-14 DIAGNOSIS — E538 Deficiency of other specified B group vitamins: Secondary | ICD-10-CM | POA: Diagnosis present

## 2020-07-14 DIAGNOSIS — R252 Cramp and spasm: Secondary | ICD-10-CM | POA: Diagnosis present

## 2020-07-14 DIAGNOSIS — Z7982 Long term (current) use of aspirin: Secondary | ICD-10-CM

## 2020-07-14 DIAGNOSIS — R54 Age-related physical debility: Secondary | ICD-10-CM | POA: Diagnosis present

## 2020-07-14 DIAGNOSIS — K219 Gastro-esophageal reflux disease without esophagitis: Secondary | ICD-10-CM | POA: Diagnosis present

## 2020-07-14 DIAGNOSIS — Z90710 Acquired absence of both cervix and uterus: Secondary | ICD-10-CM

## 2020-07-14 DIAGNOSIS — J449 Chronic obstructive pulmonary disease, unspecified: Secondary | ICD-10-CM | POA: Diagnosis present

## 2020-07-14 DIAGNOSIS — R079 Chest pain, unspecified: Secondary | ICD-10-CM | POA: Diagnosis not present

## 2020-07-14 DIAGNOSIS — I110 Hypertensive heart disease with heart failure: Secondary | ICD-10-CM | POA: Diagnosis present

## 2020-07-14 DIAGNOSIS — I25110 Atherosclerotic heart disease of native coronary artery with unstable angina pectoris: Secondary | ICD-10-CM

## 2020-07-14 DIAGNOSIS — M47812 Spondylosis without myelopathy or radiculopathy, cervical region: Secondary | ICD-10-CM | POA: Diagnosis present

## 2020-07-14 DIAGNOSIS — K581 Irritable bowel syndrome with constipation: Secondary | ICD-10-CM | POA: Diagnosis present

## 2020-07-14 DIAGNOSIS — G4733 Obstructive sleep apnea (adult) (pediatric): Secondary | ICD-10-CM | POA: Diagnosis present

## 2020-07-14 DIAGNOSIS — Z825 Family history of asthma and other chronic lower respiratory diseases: Secondary | ICD-10-CM

## 2020-07-14 DIAGNOSIS — J189 Pneumonia, unspecified organism: Secondary | ICD-10-CM

## 2020-07-14 DIAGNOSIS — E785 Hyperlipidemia, unspecified: Secondary | ICD-10-CM | POA: Diagnosis present

## 2020-07-14 DIAGNOSIS — I252 Old myocardial infarction: Secondary | ICD-10-CM

## 2020-07-14 DIAGNOSIS — R200 Anesthesia of skin: Secondary | ICD-10-CM | POA: Diagnosis present

## 2020-07-14 DIAGNOSIS — Z823 Family history of stroke: Secondary | ICD-10-CM

## 2020-07-14 DIAGNOSIS — Z79890 Hormone replacement therapy (postmenopausal): Secondary | ICD-10-CM

## 2020-07-14 HISTORY — DX: Non-ST elevation (NSTEMI) myocardial infarction: I21.4

## 2020-07-14 LAB — I-STAT CHEM 8, ED
BUN: 15 mg/dL (ref 8–23)
Calcium, Ion: 1.19 mmol/L (ref 1.15–1.40)
Chloride: 106 mmol/L (ref 98–111)
Creatinine, Ser: 0.6 mg/dL (ref 0.44–1.00)
Glucose, Bld: 109 mg/dL — ABNORMAL HIGH (ref 70–99)
HCT: 38 % (ref 36.0–46.0)
Hemoglobin: 12.9 g/dL (ref 12.0–15.0)
Potassium: 3.7 mmol/L (ref 3.5–5.1)
Sodium: 137 mmol/L (ref 135–145)
TCO2: 19 mmol/L — ABNORMAL LOW (ref 22–32)

## 2020-07-14 LAB — DIFFERENTIAL
Abs Immature Granulocytes: 0.07 10*3/uL (ref 0.00–0.07)
Basophils Absolute: 0.1 10*3/uL (ref 0.0–0.1)
Basophils Relative: 1 %
Eosinophils Absolute: 0.2 10*3/uL (ref 0.0–0.5)
Eosinophils Relative: 2 %
Immature Granulocytes: 1 %
Lymphocytes Relative: 21 %
Lymphs Abs: 2.2 10*3/uL (ref 0.7–4.0)
Monocytes Absolute: 0.9 10*3/uL (ref 0.1–1.0)
Monocytes Relative: 9 %
Neutro Abs: 7.1 10*3/uL (ref 1.7–7.7)
Neutrophils Relative %: 66 %

## 2020-07-14 LAB — COMPREHENSIVE METABOLIC PANEL
ALT: 21 U/L (ref 0–44)
AST: 21 U/L (ref 15–41)
Albumin: 4 g/dL (ref 3.5–5.0)
Alkaline Phosphatase: 58 U/L (ref 38–126)
Anion gap: 11 (ref 5–15)
BUN: 13 mg/dL (ref 8–23)
CO2: 20 mmol/L — ABNORMAL LOW (ref 22–32)
Calcium: 9.9 mg/dL (ref 8.9–10.3)
Chloride: 105 mmol/L (ref 98–111)
Creatinine, Ser: 0.82 mg/dL (ref 0.44–1.00)
GFR, Estimated: >60 mL/min (ref >60)
Glucose, Bld: 117 mg/dL — ABNORMAL HIGH (ref 70–99)
Potassium: 3.7 mmol/L (ref 3.5–5.1)
Sodium: 136 mmol/L (ref 135–145)
Total Bilirubin: 1 mg/dL (ref 0.3–1.2)
Total Protein: 6.7 g/dL (ref 6.5–8.1)

## 2020-07-14 LAB — CBC
HCT: 39.5 % (ref 36.0–46.0)
Hemoglobin: 13.1 g/dL (ref 12.0–15.0)
MCH: 31.4 pg (ref 26.0–34.0)
MCHC: 33.2 g/dL (ref 30.0–36.0)
MCV: 94.7 fL (ref 80.0–100.0)
Platelets: 256 10*3/uL (ref 150–400)
RBC: 4.17 MIL/uL (ref 3.87–5.11)
RDW: 14.8 % (ref 11.5–15.5)
WBC: 10.5 10*3/uL (ref 4.0–10.5)
nRBC: 0 % (ref 0.0–0.2)

## 2020-07-14 LAB — PROTIME-INR
INR: 0.9 (ref 0.8–1.2)
Prothrombin Time: 12 seconds (ref 11.4–15.2)

## 2020-07-14 LAB — RESP PANEL BY RT-PCR (FLU A&B, COVID) ARPGX2
Influenza A by PCR: NEGATIVE
Influenza B by PCR: NEGATIVE
SARS Coronavirus 2 by RT PCR: NEGATIVE

## 2020-07-14 LAB — VITAMIN B12: Vitamin B-12: 234 pg/mL (ref 180–914)

## 2020-07-14 LAB — TROPONIN I (HIGH SENSITIVITY)
Troponin I (High Sensitivity): 90 ng/L — ABNORMAL HIGH (ref <18)
Troponin I (High Sensitivity): 153 ng/L (ref <18)

## 2020-07-14 LAB — BRAIN NATRIURETIC PEPTIDE: B Natriuretic Peptide: 260.7 pg/mL — ABNORMAL HIGH (ref 0.0–100.0)

## 2020-07-14 LAB — APTT: aPTT: 22 seconds — ABNORMAL LOW (ref 24–36)

## 2020-07-14 LAB — CBG MONITORING, ED: Glucose-Capillary: 111 mg/dL — ABNORMAL HIGH (ref 70–99)

## 2020-07-14 MED ORDER — ACETAMINOPHEN 325 MG PO TABS
975.0000 mg | ORAL_TABLET | Freq: Three times a day (TID) | ORAL | Status: DC | PRN
  Administered 2020-07-15: 325 mg via ORAL
  Filled 2020-07-14: qty 3

## 2020-07-14 MED ORDER — POLYETHYLENE GLYCOL 3350 17 G PO PACK: 17.0000 g | PACK | Freq: Every day | ORAL | Status: DC

## 2020-07-14 MED ORDER — POLYETHYLENE GLYCOL 3350 17 G PO PACK
17.0000 g | PACK | Freq: Two times a day (BID) | ORAL | Status: DC
  Administered 2020-07-14 – 2020-07-16 (×3): 17 g via ORAL
  Filled 2020-07-14 (×4): qty 1

## 2020-07-14 MED ORDER — NITROGLYCERIN 0.4 MG SL SUBL: 0.4000 mg | SUBLINGUAL_TABLET | SUBLINGUAL | Status: DC | PRN

## 2020-07-14 MED ORDER — SODIUM CHLORIDE 0.9 % IV SOLN
500.0000 mg | Freq: Once | INTRAVENOUS | Status: DC
  Administered 2020-07-14: 500 mg via INTRAVENOUS
  Filled 2020-07-14: qty 500

## 2020-07-14 MED ORDER — SODIUM CHLORIDE 0.9 % IV SOLN
1.0000 g | Freq: Once | INTRAVENOUS | Status: DC
  Filled 2020-07-14: qty 10

## 2020-07-14 MED ORDER — LEVOTHYROXINE SODIUM 100 MCG PO TABS
100.0000 ug | ORAL_TABLET | Freq: Every day | ORAL | Status: DC
  Administered 2020-07-15 – 2020-07-16 (×2): 100 ug via ORAL
  Filled 2020-07-14 (×2): qty 1

## 2020-07-14 MED ORDER — AEROCHAMBER Z-STAT PLUS/MEDIUM MISC: 1.0000 | Freq: Once | Status: DC

## 2020-07-14 MED ORDER — ALBUTEROL SULFATE HFA 108 (90 BASE) MCG/ACT IN AERS
2.0000 | INHALATION_SPRAY | RESPIRATORY_TRACT | Status: DC | PRN
  Administered 2020-07-14: 2 via RESPIRATORY_TRACT
  Filled 2020-07-14: qty 6.7

## 2020-07-14 MED ORDER — CYANOCOBALAMIN 1000 MCG/ML IJ SOLN
1000.0000 ug | Freq: Once | INTRAMUSCULAR | Status: AC
  Administered 2020-07-14: 1000 ug via INTRAMUSCULAR
  Filled 2020-07-14: qty 1

## 2020-07-14 MED ORDER — SODIUM CHLORIDE 0.9% FLUSH
3.0000 mL | Freq: Once | INTRAVENOUS | Status: AC
  Administered 2020-07-14: 3 mL via INTRAVENOUS

## 2020-07-14 MED ORDER — HEPARIN (PORCINE) 25000 UT/250ML-% IV SOLN
650.0000 [IU]/h | INTRAVENOUS | Status: DC
  Administered 2020-07-14: 650 [IU]/h via INTRAVENOUS
  Filled 2020-07-14: qty 250

## 2020-07-14 MED ORDER — DORZOLAMIDE HCL-TIMOLOL MAL 2-0.5 % OP SOLN
1.0000 [drp] | Freq: Two times a day (BID) | OPHTHALMIC | Status: DC
  Administered 2020-07-14 – 2020-07-16 (×3): 1 [drp] via OPHTHALMIC
  Filled 2020-07-14: qty 10

## 2020-07-14 MED ORDER — ASPIRIN EC 81 MG PO TBEC
81.0000 mg | DELAYED_RELEASE_TABLET | Freq: Every day | ORAL | Status: DC
  Administered 2020-07-15 – 2020-07-16 (×2): 81 mg via ORAL
  Filled 2020-07-14 (×2): qty 1

## 2020-07-14 MED ORDER — DOXYCYCLINE HYCLATE 100 MG PO CAPS: 100.0000 mg | ORAL_CAPSULE | Freq: Two times a day (BID) | ORAL | 0 refills | Status: DC

## 2020-07-14 MED ORDER — HEPARIN BOLUS VIA INFUSION
3000.0000 [IU] | Freq: Once | INTRAVENOUS | Status: AC
  Administered 2020-07-14: 3000 [IU] via INTRAVENOUS
  Filled 2020-07-14: qty 3000

## 2020-07-14 MED ORDER — ONDANSETRON HCL 4 MG PO TABS: 8.0000 mg | ORAL_TABLET | Freq: Two times a day (BID) | ORAL | Status: DC | PRN

## 2020-07-14 MED ORDER — SODIUM CHLORIDE 0.9 % IV SOLN
2.0000 g | Freq: Once | INTRAVENOUS | Status: AC
  Administered 2020-07-14: 2 g via INTRAVENOUS
  Filled 2020-07-14: qty 20

## 2020-07-14 MED ORDER — LORAZEPAM 2 MG/ML IJ SOLN
1.0000 mg | Freq: Once | INTRAMUSCULAR | Status: AC
  Administered 2020-07-14: 1 mg via INTRAVENOUS
  Filled 2020-07-14: qty 1

## 2020-07-14 MED ORDER — SIMVASTATIN 20 MG PO TABS
20.0000 mg | ORAL_TABLET | Freq: Every day | ORAL | Status: DC
  Administered 2020-07-14 – 2020-07-15 (×2): 20 mg via ORAL
  Filled 2020-07-14 (×2): qty 1

## 2020-07-14 MED ORDER — LATANOPROST 0.005 % OP SOLN
1.0000 [drp] | Freq: Every day | OPHTHALMIC | Status: DC
  Administered 2020-07-15: 1 [drp] via OPHTHALMIC
  Filled 2020-07-14: qty 2.5

## 2020-07-14 MED ORDER — MAGNESIUM OXIDE -MG SUPPLEMENT 400 (240 MG) MG PO TABS
400.0000 mg | ORAL_TABLET | Freq: Every day | ORAL | Status: DC
  Administered 2020-07-14 – 2020-07-16 (×3): 400 mg via ORAL
  Filled 2020-07-14 (×4): qty 1

## 2020-07-14 MED ORDER — ASPIRIN 81 MG PO TABS: 81.0000 mg | ORAL_TABLET | Freq: Every day | ORAL | Status: DC

## 2020-07-14 NOTE — ED Notes (Signed)
Report attempted rn to attempt again  

## 2020-07-14 NOTE — Code Documentation (Signed)
Stroke Response Nurse Documentation Code Documentation  Leah Pittman is a 87 y.o. female arriving to [facility] ED via [facility] on 07/14/2020 with past medical hx of HTN, HLPm Bronchitis, and COPD. Code stroke was activated by [facility]. [name] where she was LKW unclear and now complaining of generalized weakness and bilateral leg weakness. Pt was diagnosed with bronchitis and COPD two weeks ago. She reports not getting better and continuing to get weak throughout this time. This morning, she woke up weak and went to see her MD. While with the MD office, pt reports having bilateral leg weakness and feeling like she was going to pass out. EMS thought they appreciated unilateral left leg weakness and activated a Code Stroke.   Stroke team at the bedside on patient arrival. Labs drawn and patient cleared for CT by Dr. Particia Nearing. Patient to CT with team. NIHSS 2, see documentation for details and code stroke times. Patient with bilateral leg weakness on exam. The following imaging was completed:  CT. Patient is not a candidate for tPA due to unclear LKW. Care/Plan: q2 mNIHSS/VS and MRI. Bedside handoff with ED RN Steward Drone.    Leah Pittman  Stroke Response RN

## 2020-07-14 NOTE — ED Provider Notes (Signed)
[facility] EMERGENCY DEPARTMENT Provider Note   CSN: [id number] Arrival date & time: 07/14/20  1244  An emergency department physician performed an initial assessment on this suspected stroke patient at 28.  History Chief Complaint  Patient presents with  . Code Stroke    ROSIA Pittman is a 87 y.o. female.  Pt presents to the ED today as a code stroke.  The pt said she's had a cough for several days.  She saw her pcp on 5/10 for the cough.  Covid ag neg.  She was put on steroids, abx, tessalon perles, and zyrtec.  She has some cp.  She went to her pcp for a routine visit and her BP was 973 systolic.  She told the doctor she was not ambulating well.  When EMS arrived, she seemed to have a right sided facial droop.  She complained of diffuse weakness.  She woke up feeling weak.  EMS called a code stroke en route and the stroke team and myself met pt at the bridge.  Pt has not been vaccinated against Covid.  She denies any f/c.        Past Medical History:  Diagnosis Date  . Anxiety   . Arthritis   . Chest pain 2011   CARDIOLITE - no significant symptoms, EKG changes, or arrhythmias  . Congenital prolapse of bladder mucosa   . Diverticulosis   . Dyspnea 02/16/2012   2D ECHO - EF 55-60%, normal  . Esophageal dysphagia   . Fatigue   . GERD (gastroesophageal reflux disease)   . Glaucoma   . Headache(784.0)   . Heart attack ([place])   . Hiatal hernia   . High blood pressure 02/16/2012   RENAL DOPPLER - normal  . Hyperlipidemia   . Hypothyroidism   . Labile hypertension   . Lightheadedness   . Migraine headache   . Multiple allergies   . Myalgia   . OSA (obstructive sleep apnea)   . Pain, lower leg    Calf  . Problem of menstruation   . Proteinuria   . Reflux   . SOB (shortness of breath)   . Thyroid disease   . Urinary problem   . Valvular heart disease   . Vitamin B12 deficiency   . Vitamin D deficiency     Patient Active Problem List    Diagnosis Date Noted  . 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 Unstable gait 12/22/2019  . Paresthesia of both feet 12/22/2019  . Bruising 12/22/2019  . Hypothyroidism   . Pancreatic duct dilated   . Descending thoracic aortic dissection ([place])   . Elevated blood pressure reading   . Acute cystitis with hematuria 08/20/2019  . Hoarseness of voice 08/20/2019  . Medication management 08/20/2019  . SOB (shortness of breath) 08/20/2019  . Irritable bowel syndrome with constipation 08/20/2019  . Chronic obstructive pulmonary disease ([place]) 08/20/2019  . Abdominal pain, chronic, epigastric 08/20/2019  . Chest pain 07/24/2019  . Hypertensive crisis 07/24/2019  . Neck pain 07/24/2019  . S/P CABG x 4 07/10/2018  . Coronary artery disease involving native coronary artery of native heart without angina pectoris 07/08/2018  . Essential hypertension 08/14/2017  . Nuclear sclerotic cataract, left 06/23/2016  . Hypercholesterolemia 11/30/2015  . Precordial chest pain 01/13/2015  . Bilateral occipital neuralgia 12/16/2014  . Osteoarthritis of cervical spine 12/16/2014  . Chest pain with high risk for cardiac etiology 12/12/2013  . GERD (gastroesophageal reflux disease) 04/01/2013  . Epiphora 02/03/2013  .  History of Clostridium difficile infection 02/03/2013  . History of dacryocystitis 02/03/2013  . 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 Blindness, legal 01/30/2013  . Pigmentary glaucoma of both eyes, 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/30/2013  . Cellulitis, periorbital 10/18/2012  . Glaucoma 09/29/2012  . Anxiety   . Labile hypertension   . Migraine headache     Past Surgical History:  Procedure Laterality Date  . ABDOMINAL HYSTERECTOMY  1976  . CHOLECYSTECTOMY  2000  . COLONOSCOPY  07/27/2014   Moderate predominantly sigmoid divertuculosis. Otherwise normal colonoscopy  . CORONARY ARTERY BYPASS GRAFT N/A 07/10/2018   Procedure: CORONARY ARTERY BYPASS GRAFTING (CABG), FREE LIMA;  Surgeon: Gaye Pollack, MD;  Location: [facility];  Service: Open Heart Surgery;  Laterality: N/A;  . CYST REMOVAL NECK    . CYSTECTOMY  1974   Intestine  . CYSTECTOMY  1996   Brain stem  . ESOPHAGOGASTRODUODENOSCOPY  12/07/2016   Schatzki's ring status post esophageal dilatation. Small hiatal hernia. Mild gastritis  . EYE SURGERY  2003  . EYE SURGERY  07/2016  . LEFT HEART CATH AND CORONARY ANGIOGRAPHY N/A 07/08/2018   Procedure: LEFT HEART CATH AND CORONARY ANGIOGRAPHY;  Surgeon: Lorretta Harp, MD;  Location: [facility];  Service: Cardiovascular;  Laterality: N/A;  . LEFT HEART CATH AND CORONARY ANGIOGRAPHY N/A 07/25/2019   Procedure: LEFT HEART CATH AND CORONARY ANGIOGRAPHY;  Surgeon: Burnell Blanks, MD;  Location: [facility];  Service: Cardiovascular;  Laterality: N/A;  . MOUTH SURGERY  2013  . RADIOLOGY WITH ANESTHESIA N/A 11/05/2019   Procedure: MRI WITH ANESTHESIA;  Surgeon: Radiologist, Medication, MD;  Location: [facility];  Service: Radiology;  Laterality: N/A;  . TEE WITHOUT CARDIOVERSION N/A 07/10/2018   Procedure: TRANSESOPHAGEAL ECHOCARDIOGRAM (TEE);  Surgeon: Gaye Pollack, MD;  Location: [facility];  Service: Open Heart Surgery;  Laterality: N/A;  . TONSILLECTOMY     age 26     OB History   No obstetric history on file.     Family History  Problem Relation Age of Onset  . Heart attack Father   . Stroke Father   . Cancer Sister   . Cancer Brother   . Asthma Brother   . Cancer Brother     Social History   Tobacco Use  . Smoking status: Never Smoker  . Smokeless tobacco: Never Used  Vaping Use  . Vaping Use: Never used  Substance Use Topics  . Alcohol use: No  . Drug use: No    Home Medications Prior to Admission medications   Medication Sig Start Date End Date Taking? Authorizing Provider  acetaminophen (TYLENOL) 325 MG tablet Take 975 mg by mouth every 8 (eight) hours as needed for mild pain or headache.  09/29/14  Yes [provider]  aspirin 81  MG tablet Take 81 mg by mouth daily.   Yes [provider]  benzonatate (TESSALON) 100 MG capsule Take 1 capsule (100 mg total) by mouth 2 (two) times daily as needed for cough. 06/30/20  Yes Rip Harbour, NP  Calcium Citrate-Vitamin D (CALCIUM CITRATE+D3 PETITES PO) Take 1 tablet by mouth daily.   Yes [provider]  dorzolamide-timolol (COSOPT) 22.3-6.8 MG/ML ophthalmic solution Place 1 drop into the left eye 2 (two) times daily.  06/05/12  Yes [provider]  doxycycline (VIBRAMYCIN) 100 MG capsule Take 1 capsule (100 mg total) by mouth 2 (two) times daily. 07/14/20  Yes Isla Pence, MD  levothyroxine (SYNTHROID)  100 MCG tablet Take 1 tablet (100 mcg total) by mouth daily before breakfast. 03/25/20  Yes [name], [name], MD  magnesium oxide (MAG-OX) 400 MG tablet Take 400 mg by mouth daily. Chewable   Yes [provider]  nitroGLYCERIN (NITROSTAT) 0.4 MG SL tablet Place 0.4 mg under the tongue every 5 (five) minutes as needed for chest pain.  05/10/18  Yes [provider]  ondansetron (ZOFRAN) 8 MG tablet TAKE 1 TABLET (8 MG TOTAL) BY MOUTH 2 (TWO) TIMES DAILY AS NEEDED FOR NAUSEA OR VOMITING. 03/23/20  Yes [name], [name], MD  pantoprazole (PROTONIX) 40 MG tablet Take 1 tablet (40 mg total) by mouth 2 (two) times daily. Patient taking differently: Take 40 mg by mouth daily. 06/14/20  Yes [name], [name], MD  potassium chloride SA (KLOR-CON) 20 MEQ tablet Take 20 mEq by mouth daily.   Yes [provider]  Probiotic Product (PROBIOTIC-10 PO) Take 1 tablet by mouth daily in the afternoon.   Yes [provider]  Sennosides-Docusate Sodium (SENNA PLUS PO) Take 1 tablet by mouth daily as needed (constipation).   Yes [provider]  simvastatin (ZOCOR) 20 MG tablet TAKE 1 TABLET BY MOUTH EVERYDAY AT BEDTIME Patient taking differently: Take 20 mg by mouth at bedtime. 03/23/20  Yes [name], [name], MD  Vitamin D, Cholecalciferol, 50 MCG (2000 UT)  CAPS Take 2,000 Units by mouth daily.   Yes [provider]  XELPROS 0.005 % EMUL Place 1 drop into the left eye at bedtime. 05/13/20  Yes [provider]  losartan (COZAAR) 100 MG tablet Take 1 tablet (100 mg total) by mouth daily. Patient not taking: Reported on 07/14/2020 11/08/19   Caren Griffins, MD    Allergies    Benadryl [diphenhydramine], Lipase concentrate-hp [digestive enzymes], Zenpep [pancrelipase (lip-prot-amyl)], Codeine, Meperidine, Other, Prednisone, Promethazine, Propranolol, Shellfish allergy, Tape, Tazarotene, and Iodinated diagnostic agents  Review of Systems   Review of Systems  Respiratory: Positive for cough and shortness of breath.   Cardiovascular: Positive for chest pain.  Neurological: Positive for weakness.  All other systems reviewed and are negative.   Physical Exam Updated Vital Signs BP (!) 134/57   Pulse (!) 58   Temp (!) 97.1 F (36.2 C) (Oral)   Resp 19   Wt 56.6 kg   SpO2 97%   BMI 21.42 kg/m   Physical Exam Vitals and nursing note reviewed.  Constitutional:      Appearance: Normal appearance.  HENT:     Head: Normocephalic and atraumatic.     Right Ear: External ear normal.     Left Ear: External ear normal.     Nose: Nose normal.     Mouth/Throat:     Mouth: Mucous membranes are moist.     Pharynx: Oropharynx is clear.  Eyes:     Extraocular Movements: Extraocular movements intact.     Conjunctiva/sclera: Conjunctivae normal.     Pupils: Pupils are equal, round, and reactive to light.  Cardiovascular:     Rate and Rhythm: Normal rate and regular rhythm.     Pulses: Normal pulses.     Heart sounds: Normal heart sounds.  Pulmonary:     Effort: Tachypnea present.     Breath sounds: Normal breath sounds.  Abdominal:     General: Abdomen is flat. Bowel sounds are normal.     Palpations: Abdomen is soft.  Musculoskeletal:        General: Normal range of motion.     Cervical back:  Normal range of motion and  neck supple.  Skin:    General: Skin is warm.     Capillary Refill: Capillary refill takes less than 2 seconds.  Neurological:     General: No focal deficit present.     Mental Status: She is alert and oriented to person, place, and time.  Psychiatric:        Mood and Affect: Mood is anxious.        Behavior: Behavior normal.        Thought Content: Thought content normal.        Judgment: Judgment normal.     ED Results / Procedures / Treatments   Labs (all labs ordered are listed, but only abnormal results are displayed) Labs Reviewed  APTT - Abnormal; Notable for the following components:      Result Value   aPTT 22 (*)    All other components within normal limits  COMPREHENSIVE METABOLIC PANEL - Abnormal; Notable for the following components:   CO2 20 (*)    Glucose, Bld 117 (*)    All other components within normal limits  I-STAT CHEM 8, ED - Abnormal; Notable for the following components:   Glucose, Bld 109 (*)    TCO2 19 (*)    All other components within normal limits  CBG MONITORING, ED - Abnormal; Notable for the following components:   Glucose-Capillary 111 (*)    All other components within normal limits  RESP PANEL BY RT-PCR (FLU A&B, COVID) [id number]  PROTIME-INR  CBC  DIFFERENTIAL  VITAMIN B12  TROPONIN I (HIGH SENSITIVITY)  TROPONIN I (HIGH SENSITIVITY)    EKG EKG Interpretation  Date/Time:  Wednesday Jul 14 2020 13:07:50 EDT Ventricular Rate:  65 PR Interval:  127 QRS Duration: 95 QT Interval:  429 QTC Calculation: 447 R Axis:   62 Text Interpretation: Sinus rhythm Nonspecific repol abnormality, diffuse leads No significant change since last tracing Confirmed by Isla Pence [phone]) on 07/14/2020 1:43:14 PM   Radiology DG Chest Portable 1 View  Result Date: 07/14/2020 CLINICAL DATA:  Productive cough EXAM: PORTABLE CHEST 1 VIEW COMPARISON:  06/29/2020 FINDINGS: Previous median sternotomy and CABG. Heart size is normal. Aortic atherosclerotic  calcification is present. No evidence of heart failure or effusion. Left lung remains clear. Patchy density at the right base consistent with mild pneumonia in the right lower lobe. No effusion. No acute bone finding. IMPRESSION: Previous CABG. Suspicion of mild patchy bronchopneumonia in the right lower lobe. Electronically Signed   By: Nelson Chimes M.D.   On: 07/14/2020 13:54   CT HEAD CODE STROKE WO CONTRAST  Result Date: 07/14/2020 CLINICAL DATA:  Code stroke. EXAM: CT HEAD WITHOUT CONTRAST TECHNIQUE: Contiguous axial images were obtained from the base of the skull through the vertex without intravenous contrast. COMPARISON:  None. FINDINGS: Brain: There is no acute intracranial hemorrhage, mass effect, or edema. Gray-white differentiation is preserved. Increased prominence of the ventricles and sulci reflects minor generalized parenchymal volume loss. Patchy hypoattenuation in the supratentorial white matter is nonspecific but probably reflects mild chronic microvascular ischemic changes. No extra-axial collection. Vascular: No hyperdense vessel. There is intracranial atherosclerotic calcification at the skull base. Skull: Unremarkable. Sinuses/Orbits: No acute abnormality. Other: Mastoid air cells are clear. ASPECTS ([place] Stroke Program Early CT Score) - Ganglionic level infarction (caudate, lentiform nuclei, internal capsule, insula, M1-M3 cortex): 7 - Supraganglionic infarction (M4-M6 cortex): 3 Total score (0-10 with 10 being normal): 10 IMPRESSION: There is no acute intracranial hemorrhage or evidence  of acute infarction. ASPECT score is 10. These results were communicated to Dr. Leonel Ramsay at 12:58 pm on 07/14/2020 by text page via the [facility] messaging system. Electronically Signed   By: Macy Mis M.D.   On: 07/14/2020 13:00    Procedures Procedures   Medications Ordered in ED Medications  cefTRIAXone (ROCEPHIN) 2 g in sodium chloride 0.9 % 100 mL IVPB (2 g Intravenous New Bag/Given  07/14/20 1525)  albuterol (VENTOLIN HFA) 108 (90 Base) MCG/ACT inhaler 2 puff (has no administration in time range)  aerochamber Z-Stat Plus/medium 1 each (has no administration in time range)  sodium chloride flush (NS) 0.9 % injection 3 mL (3 mLs Intravenous Given 07/14/20 1324)  LORazepam (ATIVAN) injection 1 mg (1 mg Intravenous Given 07/14/20 1322)    ED Course  I have reviewed the triage vital signs and the nursing notes.  Pertinent labs & imaging results that were available during my care of the patient were reviewed by me and considered in my medical decision making (see chart for details).    MDM Rules/Calculators/A&P                          CT head neg.  Pt is not a candidate for tpa as she has minimal sx.  Covid neg.  BP is much better after she is calm.  CXR shows pneumonia in the RLL.  Pt has been on Augmentin.  She will be given a dose of rocephin here and d/c with doxy if MRI ok.  If MRI brain/c-spine negative, she will be able to go home.  Husband said inhaler prescribed to her was $400. I will send give her an albuterol inhaler + spacer prior to d/c.  MRI pending.  Pt signed out to Dr. Zenia Resides at shift change.  Leah Pittman was evaluated in Emergency Department on 07/14/2020 for the symptoms described in the history of present illness. She was evaluated in the context of the global COVID-19 pandemic, which necessitated consideration that the patient might be at risk for infection with the SARS-CoV-2 virus that causes COVID-19. Institutional protocols and algorithms that pertain to the evaluation of patients at risk for COVID-19 are in a state of rapid change based on information released by regulatory bodies including the CDC and federal and state organizations. These policies and algorithms were followed during the patient's care in the ED.   CRITICAL CARE Performed by: Isla Pence   Total critical care time: 30 minutes  Critical care time was exclusive of  separately billable procedures and treating other patients.  Critical care was necessary to treat or prevent imminent or life-threatening deterioration.  Critical care was time spent personally by me on the following activities: development of treatment plan with patient and/or surrogate as well as nursing, discussions with consultants, evaluation of patient's response to treatment, examination of patient, obtaining history from patient or surrogate, ordering and performing treatments and interventions, ordering and review of laboratory studies, ordering and review of radiographic studies, pulse oximetry and re-evaluation of patient's condition.  Final Clinical Impression(s) / ED Diagnoses Final diagnoses:  Pneumonia of right lower lobe due to infectious organism    Rx / DC Orders ED Discharge Orders         Ordered    doxycycline (VIBRAMYCIN) 100 MG capsule  2 times daily        07/14/20 1526           Isla Pence, MD 07/14/20 1528

## 2020-07-14 NOTE — ED Provider Notes (Addendum)
Patient signed to me by Dr. Particia Nearing pending MRI of brain and cervical spine which were negative.  Plan was for patient to go home on doxycycline for likely pneumonia.  However patient's troponins came back elevated x2.  Patient does note that she has had some chest pain today which is since resolved.  Will start on heparin and consult cardiology and admit.  Chest x-ray concerning for bronchopneumonia will start patient on antibiotics  7:33 PM  Discussed the patient with Dr. Mayford Knife on-call for cardiology.  She recommends patient have an echocardiogram along with repeat troponins and admit to the hospitalist service Lorre Nick, MD 07/14/20 Jerene Bears    Lorre Nick, MD 07/14/20 Barry Brunner

## 2020-07-14 NOTE — ED Notes (Signed)
PT has not returned to room, will take vitals once pt returns

## 2020-07-14 NOTE — Progress Notes (Signed)
ANTICOAGULATION CONSULT NOTE - Initial Consult  Pharmacy Consult for heparin Indication: chest pain/ACS  Allergies  Allergen Reactions  . Benadryl [Diphenhydramine] Swelling and Other (See Comments)    Tongue swells and hallucinates- could not talk  . Lipase Concentrate-Hp [Digestive Enzymes] Rash  . Zenpep [Pancrelipase (Lip-Prot-Amyl)] Rash  . Codeine Nausea And Vomiting and Hypertension  . Meperidine Nausea Only, Swelling and Other (See Comments)    Tongue swells and "I feel like death"  . Other Other (See Comments)    Tears the skin  . Prednisone     Hypertension   . Promethazine Other (See Comments)    Hypotension   . Propranolol Nausea And Vomiting  . Shellfish Allergy Nausea And Vomiting  . Tape Other (See Comments)    Tears the skin  . Tazarotene Other (See Comments)    Reaction not recalled   . Iodinated Diagnostic Agents Nausea Only and Rash    Patient Measurements: Weight: 56.6 kg (124 lb 12.5 oz) Heparin Dosing Weight: 56.7 kg   Vital Signs: Temp: 97.1 F (36.2 C) (05/25 1311) Temp Source: Oral (05/25 1311) BP: 151/57 (05/25 1900) Pulse Rate: 74 (05/25 1915)  Labs: Recent Labs    07/14/20 1246 07/14/20 1254 07/14/20 1511 07/14/20 1742  HGB 13.1 12.9  --   --   HCT 39.5 38.0  --   --   PLT 256  --   --   --   APTT 22*  --   --   --   LABPROT 12.0  --   --   --   INR 0.9  --   --   --   CREATININE 0.82 0.60  --   --   TROPONINIHS  --   --  90* 153*    Estimated Creatinine Clearance: 42.8 mL/min (by C-G formula based on SCr of 0.6 mg/dL).   Medical History: Past Medical History:  Diagnosis Date  . Anxiety   . Arthritis   . Chest pain 2011   CARDIOLITE - no significant symptoms, EKG changes, or arrhythmias  . Congenital prolapse of bladder mucosa   . Diverticulosis   . Dyspnea 02/16/2012   2D ECHO - EF 55-60%, normal  . Esophageal dysphagia   . Fatigue   . GERD (gastroesophageal reflux disease)   . Glaucoma   . Headache(784.0)   .  Heart attack ([facility])   . Hiatal hernia   . High blood pressure 02/16/2012   RENAL DOPPLER - normal  . Hyperlipidemia   . Hypothyroidism   . Labile hypertension   . Lightheadedness   . Migraine headache   . Multiple allergies   . Myalgia   . OSA (obstructive sleep apnea)   . Pain, lower leg    Calf  . Problem of menstruation   . Proteinuria   . Reflux   . SOB (shortness of breath)   . Thyroid disease   . Urinary problem   . Valvular heart disease   . Vitamin B12 deficiency   . Vitamin D deficiency     Medications:  (Not in a hospital admission)   Assessment: 36 YOF with chest pain and rising troponins to start IV heparin for ACS. H/H and Plt wnl. SCr wnl  Goal of Therapy:  Heparin level 0.3-0.7 units/ml Monitor platelets by anticoagulation protocol: Yes   Plan:  -Heparin 3000 units IV bolus followed by heparin infusion at 650 units/hr  -F/u 8 HL -Monitor daily HL, CBC and s/s of bleeding  Sharlet Salina  Meila Berke, PharmD., BCPS, BCCCP Clinical Pharmacist Please refer to [name] for unit-specific pharmacist

## 2020-07-14 NOTE — Progress Notes (Signed)
Acute Office Visit  Subjective:    Patient ID: Leah Pittman, female    DOB: 23-Aug-1930, 87 y.o.   MRN: [id number]  Chief Complaint  Patient presents with  . Shortness of Breath    HPI Patient is 87 year old white female with history of coronary artery disease status post CABG in the summer 2020 , HTN, and Hyperlipidemia. Patient is in today for SOB x 3 weeks, worsens with walking. States she feels very fatigue, light headed at times, feels like she is taking short breaths. Has been using breztri inhaler bid however she did not know the inhaler was empty until her son told her. C/o productive cough sputum is whitish in color. Completed augmentin. Feels like she is having congestion in the middle of her chest. Pt has DOE. At last visit pt was documented to have a COPD exacerbation however I have NO known history of her having COPD.  I think she had acute bronchitis.  She was given Augmentin, prednisone and Breztri. CXR at that time was normal. She improved somewhat but never got fully better, but has worsened again.  I had completed her exam and I was sending her for an outpatient D-dimer, CBC, CMP and as she was leaving she became increasingly short of breath, clammy, diaphoretic,.  No chest pain she does report chest congestion. BP jumped 210/102, pulse ox 99, and pulse 76. Recent echo in epic reviewed.    Past Medical History:  Diagnosis Date  . Anxiety   . Arthritis   . Chest pain 2011   CARDIOLITE - no significant symptoms, EKG changes, or arrhythmias  . Congenital prolapse of bladder mucosa   . Diverticulosis   . Dyspnea 02/16/2012   2D ECHO - EF 55-60%, normal  . Esophageal dysphagia   . Fatigue   . GERD (gastroesophageal reflux disease)   . Glaucoma   . Headache(784.0)   . Heart attack ([place])   . Hiatal hernia   . High blood pressure 02/16/2012   RENAL DOPPLER - normal  . Hyperlipidemia   . Hypothyroidism   . Labile hypertension   . Lightheadedness   . Migraine  headache   . Multiple allergies   . Myalgia   . OSA (obstructive sleep apnea)   . Pain, lower leg    Calf  . Problem of menstruation   . Proteinuria   . Reflux   . SOB (shortness of breath)   . Thyroid disease   . Urinary problem   . Valvular heart disease   . Vitamin B12 deficiency   . Vitamin D deficiency     Past Surgical History:  Procedure Laterality Date  . ABDOMINAL HYSTERECTOMY  1976  . CHOLECYSTECTOMY  2000  . COLONOSCOPY  07/27/2014   Moderate predominantly sigmoid divertuculosis. Otherwise normal colonoscopy  . CORONARY ARTERY BYPASS GRAFT N/A 07/10/2018   Procedure: CORONARY ARTERY BYPASS GRAFTING (CABG), FREE LIMA;  Surgeon: Gaye Pollack, MD;  Location: [facility];  Service: Open Heart Surgery;  Laterality: N/A;  . CYST REMOVAL NECK    . CYSTECTOMY  1974   Intestine  . CYSTECTOMY  1996   Brain stem  . ESOPHAGOGASTRODUODENOSCOPY  12/07/2016   Schatzki's ring status post esophageal dilatation. Small hiatal hernia. Mild gastritis  . EYE SURGERY  2003  . EYE SURGERY  07/2016  . LEFT HEART CATH AND CORONARY ANGIOGRAPHY N/A 07/08/2018   Procedure: LEFT HEART CATH AND CORONARY ANGIOGRAPHY;  Surgeon: Lorretta Harp, MD;  Location: [facility];  Service: Cardiovascular;  Laterality: N/A;  . LEFT HEART CATH AND CORONARY ANGIOGRAPHY N/A 07/25/2019   Procedure: LEFT HEART CATH AND CORONARY ANGIOGRAPHY;  Surgeon: Burnell Blanks, MD;  Location: [facility];  Service: Cardiovascular;  Laterality: N/A;  . MOUTH SURGERY  2013  . RADIOLOGY WITH ANESTHESIA N/A 11/05/2019   Procedure: MRI WITH ANESTHESIA;  Surgeon: Radiologist, Medication, MD;  Location: [facility];  Service: Radiology;  Laterality: N/A;  . TEE WITHOUT CARDIOVERSION N/A 07/10/2018   Procedure: TRANSESOPHAGEAL ECHOCARDIOGRAM (TEE);  Surgeon: Gaye Pollack, MD;  Location: [facility];  Service: Open Heart Surgery;  Laterality: N/A;  . TONSILLECTOMY     age 87    Family History  Problem Relation Age of  Onset  . Heart attack Father   . Stroke Father   . Cancer Sister   . Cancer Brother   . Asthma Brother   . Cancer Brother     Social History   Socioeconomic History  . Marital status: Married    Spouse name: Not on file  . Number of children: 3  . Years of education: college  . Highest education level: Not on file  Occupational History  . Occupation: Retired  Tobacco Use  . Smoking status: Never Smoker  . Smokeless tobacco: Never Used  Vaping Use  . Vaping Use: Never used  Substance and Sexual Activity  . Alcohol use: No  . Drug use: No  . Sexual activity: Not on file  Other Topics Concern  . Not on file  Social History Narrative   Lives with husband in [place], [place]   Social Determinants of Health   Financial Resource Strain: Not on file  Food Insecurity: No Food Insecurity  . Worried About Charity fundraiser in the Last Year: Never true  . Ran Out of Food in the Last Year: Never true  Transportation Needs: Not on file  Physical Activity: Not on file  Stress: Not on file  Social Connections: Not on file  Intimate Partner Violence: Not on file    Outpatient Medications Prior to Visit  Medication Sig Dispense Refill  . acetaminophen (TYLENOL) 325 MG tablet Take 975 mg by mouth every 8 (eight) hours as needed for mild pain or headache.     Marland Kitchen aspirin 81 MG tablet Take 81 mg by mouth daily.    . benzonatate (TESSALON) 100 MG capsule Take 1 capsule (100 mg total) by mouth 2 (two) times daily as needed for cough. 20 capsule 0  . Calcium Citrate-Vitamin D (CALCIUM CITRATE+D3 PETITES PO) Take 1 tablet by mouth daily.    . dorzolamide-timolol (COSOPT) 22.3-6.8 MG/ML ophthalmic solution Place 1 drop into the left eye 2 (two) times daily.     Marland Kitchen latanoprost (XALATAN) 0.005 % ophthalmic solution Place 1 drop into both eyes at bedtime.     Marland Kitchen levothyroxine (SYNTHROID) 100 MCG tablet Take 1 tablet (100 mcg total) by mouth daily before breakfast. 90 tablet 1  . losartan (COZAAR)  100 MG tablet Take 1 tablet (100 mg total) by mouth daily. (Patient taking differently: Take 25 mg by mouth daily. Patient reports taking 25 mg daily.) 30 tablet 1  . magnesium oxide (MAG-OX) 400 MG tablet Take 400 mg by mouth daily. Chewable    . nitroGLYCERIN (NITROSTAT) 0.4 MG SL tablet Place 0.4 mg under the tongue every 5 (five) minutes as needed for chest pain.     Marland Kitchen ondansetron (ZOFRAN) 8 MG tablet TAKE 1 TABLET (8  MG TOTAL) BY MOUTH 2 (TWO) TIMES DAILY AS NEEDED FOR NAUSEA OR VOMITING. 40 tablet 1  . pantoprazole (PROTONIX) 40 MG tablet Take 1 tablet (40 mg total) by mouth 2 (two) times daily. 180 tablet 1  . potassium chloride SA (KLOR-CON) 20 MEQ tablet Take 20 mEq by mouth daily.    . Probiotic Product (PROBIOTIC-10 PO) Take 1 tablet by mouth daily in the afternoon.    Orlie Dakin Sodium (SENNA PLUS PO) Take 1 tablet by mouth as directed.    . simvastatin (ZOCOR) 20 MG tablet TAKE 1 TABLET BY MOUTH EVERYDAY AT BEDTIME 90 tablet 0  . Vitamin D, Cholecalciferol, 50 MCG (2000 UT) CAPS Take 1 tablet by mouth daily.    . XELPROS 0.005 % EMUL Place 1 drop into the left eye at bedtime.    Marland Kitchen amoxicillin-clavulanate (AUGMENTIN) 875-125 MG tablet Take 1 tablet by mouth 2 (two) times daily. 20 tablet 0  . predniSONE (DELTASONE) 10 MG tablet Take 1 tablet (10 mg total) by mouth daily with breakfast. 20 tablet 0   No facility-administered medications prior to visit.    Allergies  Allergen Reactions  . Benadryl [Diphenhydramine] Swelling and Other (See Comments)    Tongue swells and hallucinates- could not talk  . Lipase Concentrate-Hp [Digestive Enzymes] Rash  . Zenpep [Pancrelipase (Lip-Prot-Amyl)] Rash  . Codeine Nausea And Vomiting and Hypertension  . Meperidine Nausea Only, Swelling and Other (See Comments)    Tongue swells and "I feel like death"  . Other Other (See Comments)    Tears the skin  . Prednisone     Hypertension   . Promethazine Other (See Comments)     Hypotension   . Propranolol Nausea And Vomiting  . Shellfish Allergy Nausea And Vomiting  . Tape Other (See Comments)    Tears the skin  . Tazarotene Other (See Comments)    Reaction not recalled   . Iodinated Diagnostic Agents Nausea Only and Rash    Review of Systems  Constitutional: Positive for fatigue. Negative for chills and fever.  HENT: Positive for congestion and sinus pain. Negative for ear pain, postnasal drip and sore throat.   Respiratory: Positive for cough and shortness of breath.   Cardiovascular: Negative for chest pain.  Neurological: Positive for dizziness, light-headedness and headaches (hurts with coughing. ).       Objective:    Physical Exam Vitals reviewed.  HENT:     Mouth/Throat:     Mouth: Mucous membranes are moist.     Pharynx: No pharyngeal swelling or oropharyngeal exudate.  Cardiovascular:     Rate and Rhythm: Normal rate and regular rhythm.     Heart sounds: Normal heart sounds.  Pulmonary:     Effort: Tachypnea present. No respiratory distress (dyspnea with speech).     Breath sounds: Normal breath sounds.  Neurological:     Mental Status: She is alert.    PATIENT ARRIVED AT 10:30, BUT HAD NO NEUROLOGICAL CHANGES UNTIL 11:20. AFTER I SAW PT SHE WAS WALKING OUT BECAME INCREASINGLY SOB, DIAPHORETIC AND WE GOT HER BACK IN TO ROOM. SHE FELT WEAK AND DIZZY. SHE STARTED HAVING CHEST PAIN. BP 210/102. PULSE CONSISTENTLY 97-99%. PULSE 76.  EKG DONE SHOWED NEW INVERTED T WAVE IN V3. MILD DEPRESSION OF ST IN V5.  GIVEN NTG X 1 AND BABY ASPIRIN X 2. PT UNABLE TO TAKE MORE.  PT WAS HAVING TROUBLE HOLDING HER HEAD UP. NEURO ASSESSMENT DONE BY EMS SHOWED GENERALIZED WEAKNESS. GOOD SENSATION, BUT  POSSIBLY FACIAL DROOP ON LEFT.  EMS ACTIVATED STROKE CODE.    BP 130/78   Pulse 71   Temp (!) 96.7 F (35.9 C)   Ht $R'5\' 4"'zp$  (1.626 m)   Wt 125 lb (56.7 kg)   SpO2 99%   BMI 21.46 kg/m  Wt Readings from Last 3 Encounters:  07/14/20 125 lb (56.7 kg)   06/30/20 122 lb (55.3 kg)  06/29/20 121 lb (54.9 kg)    Health Maintenance Due  Topic Date Due  . PNA vac Low Risk Adult (1 of 2 - PCV13) Never done    There are no preventive care reminders to display for this patient.   Lab Results  Component Value Date   TSH 4.450 06/14/2020   Lab Results  Component Value Date   WBC 6.5 06/14/2020   HGB 12.7 06/14/2020   HCT 37.3 06/14/2020   MCV 93 06/14/2020   PLT 230 06/14/2020   Lab Results  Component Value Date   NA 139 06/14/2020   K 4.1 06/14/2020   CO2 20 06/14/2020   GLUCOSE 101 (H) 06/14/2020   BUN 16 06/14/2020   CREATININE 0.88 06/14/2020   BILITOT 0.4 06/14/2020   ALKPHOS 73 06/14/2020   AST 13 06/14/2020   ALT 16 06/14/2020   PROT 6.8 06/14/2020   ALBUMIN 4.6 06/14/2020   CALCIUM 10.0 06/14/2020   ANIONGAP 8 11/05/2019   EGFR 64 06/14/2020   GFR 60.94 01/27/2020   Lab Results  Component Value Date   CHOL 250 (H) 06/14/2020   Lab Results  Component Value Date   HDL 65 06/14/2020   Lab Results  Component Value Date   LDLCALC 163 (H) 06/14/2020   Lab Results  Component Value Date   TRIG 125 06/14/2020   Lab Results  Component Value Date   CHOLHDL 3.8 06/14/2020   Lab Results  Component Value Date   HGBA1C 5.8 (H) 07/10/2018       Assessment & Plan:  1. Shortness of breath 2. Coronary artery disease involving native coronary artery of native heart with unstable angina pectoris ([place]) 3. Hypertensive emergency 4. Facial droop   EMS called.  Taken to [facility] for code stroke. I have been concerned about the possibility of a pulmonary embolism.  Recommend further work-up for this depending on progression of her presentation.  SPENT 40 MINUTES WITH PATIENT, EMS, ETC.    Follow-up: No follow-ups on file.  An After Visit Summary was printed and given to the patient.  Rochel Brome, MD [facility] [phone]

## 2020-07-14 NOTE — ED Triage Notes (Signed)
PT here via EMS from Dr's office.  Pt was there for a routine visit when her bp was 237 systolic, she was experiencing L sided chest pain and she wasn't ambulating as well as she normally does.  When EMS arrived pr exhibited R facial droop and no L leg movement.  Pt was also c/o bil hand and toe numbness.  PT given 4 sl nitro that decreased bp to 218/100.  PT states she woke up weak.

## 2020-07-14 NOTE — ED Notes (Signed)
Dr Freida Busman notified of elevated trop.

## 2020-07-14 NOTE — H&P (Addendum)
Family Medicine Teaching [facility] [place] Admission History and Physical Service Pager: [phone]  Patient name: Leah Pittman Medical record number: [id number] Date of birth: 1930/03/09 Age: 87 y.o. Gender: female  Primary Care Provider: Blane Ohara, MD Consultants: Cardiology Code Status: DNR  Preferred Emergency Contact: [phone]  Chief Complaint: SOB, CP   Assessment and Plan: Leah Pittman is a 87 y.o. female presenting with SOB, CP. PMH is significant for HTN, HLD, multi vessal CAD s/p CABG, hypothyroidism, Vit B12 deficiency, GERD, esophageal dysphagia, anxiety.   Chest pain  SOB  Earlier today at PCP visit with BP of 210/102 and was given Nitro x1, Aspirin  x2. Thought to have inreased weakness and facial droop so code stroke called. Currently patient asymptomatic without CP but having some SOB while talking. BP 169/22m HR 70.  EKG NSR.  Troponins elevated at 90 > 153. BNP elevated at 260.7. CXR concerning for bronchopneumonia. She received Azithromycin and CTX in the ED. Cardiology consulted who recommended getting an echo and starting a heparin gtt. SOB and chest pain potentially from deconditioning as she reports pain when she coughs. Could also be due to pneumonia given CXR findings, though afebrile and without a white count. Possible cardiac etiology given EKG changes in office visit earlier, as well as elevated troponins though trops could be from demand ischemia. Will follow up on echo. Admitting patient for further cardiac workup.  - admit to FPTS cardiac telemetry, attending Dr. Deirdre Pittman  - cardiology consulted, appreciate recommendations - vitals per floor  - cardiac monitoring  - S/p Azithryomycin and CTX (5/25) - f/u echo - trend troponins  - AM BMP, CBC - up with assistance - strict Is and Os  - PT/OT  HFpEF Echo on 4/5 with EF 55-60% with G1DD  Follows with cardiologist Dr. Odetta Pittman  - cardiology following, appreciate recommendations  - strict Is  and Os - daily weights - f/u echo   HTN BP ranging from 110-187/42-85 Home medication: Losartan  daily - continue to monitor   Coronary artery disease s/p CABG Home medication: Aspirin  daily, Simvastatin  daily  - continue aspirin and statin  Constipation Was referred to GI outpatient for constipation and abdominal pain  Takes miralax BID  - continue Miralax BID   Esophageal dysphagia  GERD  Referred to GI outpatient. Last EGD in 2018 showed mild dilated esophageal stricture  Continues to have issues with dysphagia  Home medication: Protonix  BID   - hold home med   Hypothyroidism  Last TSH 4.450 on 4/25 Home medication: Synthroid  am daily  - continue Synthroid  Weakness  Vit B12 deficiency MRI brain and cervical spine negative. On presentation, neuro exam without any focal deficits. CN 2-12 in tact. Decreased sensation of LE equally bilaterally. Patient does have Vit B12 deficiency which could be contributing and was repleted I the ED  - continue to monitor    FEN/GI: heart healthy diet  Prophylaxis: Heparin gtt   Disposition: cardiac-tele   History of Present Illness:  Leah Pittman is a 87 y.o. female presenting with SOB, CP  At PCP appt today as patient left visit became SOB, diaphoretic, weak, dizzy. Started having CP. BP 210/102 EKG in office with new inverted T wave in V3, mild depression of ST in V5 Was given Nitro x1, baby aspirin x2 Neuro assessment showed generalized weakness and possibly facial droop on left EMS called and activated code stroke  -has had congestion for about 2.5 weeks with cough  and overall unwell/fatigue and saw PCP several times.   - 5/10- for concern for COPD exacerbation>started augmentin, PO prednisone, IM CTX given once, kenalog injection and covid test negative. Started mucinex. Ordered chest xray (results negative for active infection).  -5/11- added zyrtec, tessalon, breztri inhaler, continue augmentin,  and prednisone for COPD exacerbation vs acute brochitis and recommended stop mucinex and increase fluid intake.   - 5/25- f/u for SOB with PCP as per above   At baseline patient is very independent and lives with husband.   Denies tobacco, alcohol or recreational drug use  ED course: Once she arrived to the ED, her vital signs were stable on room air. Her head imaging was negative for acute ischemia and neurology consult did not recommend further neurological evaluation. Chest xray showed R lower lobe infiltrates. troponins were elevated to 90 and 153 and cardiology was consulted and recommended echo and repeat troponins. Patient was started on heparin gtt. She was also given vitamin B12, ativan, albuterol, azithromycin, and ceftriaxone.   Review Of Systems: Per HPI with the following additions:   Review of Systems  Constitutional: Positive for fatigue. Negative for chills and fever.  HENT: Positive for congestion and voice change.   Respiratory: Positive for shortness of breath.   Cardiovascular: Negative for chest pain, palpitations and leg swelling.  Gastrointestinal: Positive for abdominal pain and constipation. Negative for vomiting.  Genitourinary: Negative for dysuria.  Neurological: Positive for light-headedness.       Presyncopal earlier today      Patient Active Problem List   Diagnosis Date Noted  . NSTEMI (non-ST elevated myocardial infarction) (HCC) 07/14/2020  . 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 Unstable gait 12/22/2019  . Paresthesia of both feet 12/22/2019  . Bruising 12/22/2019  . Hypothyroidism   . Pancreatic duct dilated   . Descending thoracic aortic dissection (HCC)   . Elevated blood pressure reading   . Acute cystitis with hematuria 08/20/2019  . Hoarseness of voice 08/20/2019  . Medication management 08/20/2019  . SOB (shortness of breath) 08/20/2019  . Irritable bowel syndrome with constipation 08/20/2019  . Chronic obstructive pulmonary disease (HCC)  08/20/2019  . Abdominal pain, chronic, epigastric 08/20/2019  . Chest pain 07/24/2019  . Hypertensive crisis 07/24/2019  . Neck pain 07/24/2019  . S/P CABG x 4 07/10/2018  . Coronary artery disease involving native coronary artery of native heart without angina pectoris 07/08/2018  . Essential hypertension 08/14/2017  . Nuclear sclerotic cataract, left 06/23/2016  . Hypercholesterolemia 11/30/2015  . Precordial chest pain 01/13/2015  . Bilateral occipital neuralgia 12/16/2014  . Osteoarthritis of cervical spine 12/16/2014  . Chest pain with high risk for cardiac etiology 12/12/2013  . GERD (gastroesophageal reflux disease) 04/01/2013  . Epiphora 02/03/2013  . History of Clostridium difficile infection 02/03/2013  . History of dacryocystitis 02/03/2013  . 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 Blindness, legal 01/30/2013  . Pigmentary glaucoma of both eyes, 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/30/2013  . Cellulitis, periorbital 10/18/2012  . Glaucoma 09/29/2012  . Anxiety   . Labile hypertension   . Migraine headache     Past Medical History: Past Medical History:  Diagnosis Date  . Anxiety   . Arthritis   . Chest pain 2011   CARDIOLITE - no significant symptoms, EKG changes, or arrhythmias  . Congenital prolapse of bladder mucosa   . Diverticulosis   . Dyspnea 02/16/2012   2D ECHO - EF 55-60%, normal  . Esophageal dysphagia   . Fatigue   .  GERD (gastroesophageal reflux disease)   . Glaucoma   . Headache(784.0)   . Heart attack (HCC)   . Hiatal hernia   . High blood pressure 02/16/2012   RENAL DOPPLER - normal  . Hyperlipidemia   . Hypothyroidism   . Labile hypertension   . Lightheadedness   . Migraine headache   . Multiple allergies   . Myalgia   . OSA (obstructive sleep apnea)   . Pain, lower leg    Calf  . Problem of menstruation   . Proteinuria   . Reflux   . SOB (shortness of breath)   . Thyroid disease   . Urinary problem   . Valvular heart disease   .  Vitamin B12 deficiency   . Vitamin D deficiency     Past Surgical History: Past Surgical History:  Procedure Laterality Date  . ABDOMINAL HYSTERECTOMY  1976  . CHOLECYSTECTOMY  2000  . COLONOSCOPY  07/27/2014   Moderate predominantly sigmoid divertuculosis. Otherwise normal colonoscopy  . CORONARY ARTERY BYPASS GRAFT N/A 07/10/2018   Procedure: CORONARY ARTERY BYPASS GRAFTING (CABG), FREE LIMA;  Surgeon: [name], MD;  Location: [facility] OR;  Service: Open Heart Surgery;  Laterality: N/A;  . CYST REMOVAL NECK    . CYSTECTOMY  1974   Intestine  . CYSTECTOMY  1996   Brain stem  . ESOPHAGOGASTRODUODENOSCOPY  12/07/2016   Schatzki's ring status post esophageal dilatation. Small hiatal hernia. Mild gastritis  . EYE SURGERY  2003  . EYE SURGERY  07/2016  . LEFT HEART CATH AND CORONARY ANGIOGRAPHY N/A 07/08/2018   Procedure: LEFT HEART CATH AND CORONARY ANGIOGRAPHY;  Surgeon: Runell Gess, MD;  Location: [facility] INVASIVE CV LAB;  Service: Cardiovascular;  Laterality: N/A;  . LEFT HEART CATH AND CORONARY ANGIOGRAPHY N/A 07/25/2019   Procedure: LEFT HEART CATH AND CORONARY ANGIOGRAPHY;  Surgeon: Kathleene Hazel, MD;  Location: [facility] INVASIVE CV LAB;  Service: Cardiovascular;  Laterality: N/A;  . MOUTH SURGERY  2013  . RADIOLOGY WITH ANESTHESIA N/A 11/05/2019   Procedure: MRI WITH ANESTHESIA;  Surgeon: Radiologist, Medication, MD;  Location: [facility] OR;  Service: Radiology;  Laterality: N/A;  . TEE WITHOUT CARDIOVERSION N/A 07/10/2018   Procedure: TRANSESOPHAGEAL ECHOCARDIOGRAM (TEE);  Surgeon: [name], MD;  Location: [facility] OR;  Service: Open Heart Surgery;  Laterality: N/A;  . TONSILLECTOMY     age 58    Social History: Social History   Tobacco Use  . Smoking status: Never Smoker  . Smokeless tobacco: Never Used  Vaping Use  . Vaping Use: Never used  Substance Use Topics  . Alcohol use: No  . Drug use: No    Family History: Family History  Problem Relation Age of Onset  .  Heart attack Father   . Stroke Father   . Cancer Sister   . Cancer Brother   . Asthma Brother   . Cancer Brother    Allergies and Medications: Allergies  Allergen Reactions  . Benadryl [Diphenhydramine] Swelling and Other (See Comments)    Tongue swells and hallucinates- could not talk  . Lipase Concentrate-Hp [Digestive Enzymes] Rash  . Zenpep [Pancrelipase (Lip-Prot-Amyl)] Rash  . Codeine Nausea And Vomiting and Hypertension  . Meperidine Nausea Only, Swelling and Other (See Comments)    Tongue swells and "I feel like death"  . Other Other (See Comments)    Tears the skin  . Prednisone     Hypertension   . Promethazine Other (See Comments)    Hypotension   .  Propranolol Nausea And Vomiting  . Shellfish Allergy Nausea And Vomiting  . Tape Other (See Comments)    Tears the skin  . Tazarotene Other (See Comments)    Reaction not recalled   . Iodinated Diagnostic Agents Nausea Only and Rash   No current facility-administered medications on file prior to encounter.   Current Outpatient Medications on File Prior to Encounter  Medication Sig Dispense Refill  . acetaminophen (TYLENOL) 325 MG tablet Take 975 mg by mouth every 8 (eight) hours as needed for mild pain or headache.     Marland Kitchen aspirin 81 MG tablet Take 81 mg by mouth daily.    . benzonatate (TESSALON) 100 MG capsule Take 1 capsule (100 mg total) by mouth 2 (two) times daily as needed for cough. 20 capsule 0  . Calcium Citrate-Vitamin D (CALCIUM CITRATE+D3 PETITES PO) Take 1 tablet by mouth daily.    . dorzolamide-timolol (COSOPT) 22.3-6.8 MG/ML ophthalmic solution Place 1 drop into the left eye 2 (two) times daily.     Marland Kitchen levothyroxine (SYNTHROID) 100 MCG tablet Take 1 tablet (100 mcg total) by mouth daily before breakfast. 90 tablet 1  . magnesium oxide (MAG-OX) 400 MG tablet Take 400 mg by mouth daily. Chewable    . nitroGLYCERIN (NITROSTAT) 0.4 MG SL tablet Place 0.4 mg under the tongue every 5 (five) minutes as needed  for chest pain.     Marland Kitchen ondansetron (ZOFRAN) 8 MG tablet TAKE 1 TABLET (8 MG TOTAL) BY MOUTH 2 (TWO) TIMES DAILY AS NEEDED FOR NAUSEA OR VOMITING. 40 tablet 1  . pantoprazole (PROTONIX) 40 MG tablet Take 1 tablet (40 mg total) by mouth 2 (two) times daily. (Patient taking differently: Take 40 mg by mouth daily.) 180 tablet 1  . potassium chloride SA (KLOR-CON) 20 MEQ tablet Take 20 mEq by mouth daily.    . Probiotic Product (PROBIOTIC-10 PO) Take 1 tablet by mouth daily in the afternoon.    Bernadette Hoit Sodium (SENNA PLUS PO) Take 1 tablet by mouth daily as needed (constipation).    . simvastatin (ZOCOR) 20 MG tablet TAKE 1 TABLET BY MOUTH EVERYDAY AT BEDTIME (Patient taking differently: Take 20 mg by mouth at bedtime.) 90 tablet 0  . Vitamin D, Cholecalciferol, 50 MCG (2000 UT) CAPS Take 2,000 Units by mouth daily.    . XELPROS 0.005 % EMUL Place 1 drop into the left eye at bedtime.    Marland Kitchen losartan (COZAAR) 100 MG tablet Take 1 tablet (100 mg total) by mouth daily. (Patient not taking: Reported on 07/14/2020) 30 tablet 1    Objective: BP (!) 169/75   Pulse 70   Temp (!) 97.1 F (36.2 C) (Oral)   Resp 14   Wt 56.6 kg   SpO2 100%   BMI 21.42 kg/m   Physical Exam Constitutional:      General: She is not in acute distress.    Appearance: Normal appearance.  HENT:     Head: Normocephalic and atraumatic.     Mouth/Throat:     Mouth: Mucous membranes are moist.  Eyes:     Extraocular Movements: Extraocular movements intact.     Conjunctiva/sclera: Conjunctivae normal.     Pupils: Pupils are equal, round, and reactive to light.  Cardiovascular:     Rate and Rhythm: Normal rate and regular rhythm.     Pulses: Normal pulses.     Heart sounds: Normal heart sounds.  Pulmonary:     Effort: Pulmonary effort is normal.  Breath sounds: Normal breath sounds.     Comments: Patient with productive cough during exam  Abdominal:     General: There is no distension.     Palpations:  Abdomen is soft.  Musculoskeletal:        General: Normal range of motion.     Cervical back: Normal range of motion and neck supple.  Skin:    General: Skin is warm and dry.  Neurological:     General: No focal deficit present.     Mental Status: She is alert.  Psychiatric:        Mood and Affect: Mood normal.        Behavior: Behavior normal.     Labs and Imaging: CBC BMET  Recent Labs  Lab 07/14/20 1246 07/14/20 1254  WBC 10.5  --   HGB 13.1 12.9  HCT 39.5 38.0  PLT 256  --    Recent Labs  Lab 07/14/20 1246 07/14/20 1254  NA 136 137  K 3.7 3.7  CL 105 106  CO2 20*  --   BUN 13 15  CREATININE 0.82 0.60  GLUCOSE 117* 109*  CALCIUM 9.9  --      EKG: EKG NSR. Rate 66 bpm   MR BRAIN WO CONTRAST  Result Date: 07/14/2020 CLINICAL DATA:  Neuro deficit, acute, stroke suspected. EXAM: MRI HEAD WITHOUT CONTRAST TECHNIQUE: Multiplanar, multiecho pulse sequences of the brain and surrounding structures were obtained without intravenous contrast. COMPARISON:  Prior head CT 07/14/2020.  Brain MRI 11/14/2016. FINDINGS: Brain: Mild cerebral and cerebellar atrophy. Mild multifocal T2/FLAIR hyperintensity within the cerebral white matter is nonspecific, but compatible with chronic small vessel ischemic disease. There is no acute infarct. No evidence of intracranial mass. No chronic intracranial blood products. No extra-axial fluid collection. No midline shift. Vascular: Expected proximal arterial flow voids. Skull and upper cervical spine: No focal marrow lesion. Sinuses/Orbits: Visualized orbits show no acute finding. Bilateral lens replacements. Trace bilateral ethmoid and right maxillary sinus mucosal thickening. Frothy secretions within the left sphenoid sinus. IMPRESSION: No evidence of acute intracranial abnormality. Mild generalized parenchymal atrophy and cerebral white matter chronic small vessel ischemic disease, stable as compared to the brain MRI of 11/14/2016. Mild paranasal  sinus disease, as described. Electronically Signed   By: Jackey Loge DO   On: 07/14/2020 17:00   MR CERVICAL SPINE WO CONTRAST  Result Date: 07/14/2020 CLINICAL DATA:  Myelopathy, acute or progressive. EXAM: MRI CERVICAL SPINE WITHOUT CONTRAST TECHNIQUE: Multiplanar, multisequence MR imaging of the cervical spine was performed. No intravenous contrast was administered. COMPARISON:  CT of the cervical spine 07/24/2019. Cervical spine MRI 11/14/2016. FINDINGS: Intermittently motion degraded exam. Most notably, there is moderate motion degradation of the axial T2 TSE sequence. Alignment: Straightening of the expected cervical lordosis. No significant spondylolisthesis. Vertebrae: Vertebral body height is maintained. No significant marrow edema or focal suspicious osseous lesion. Cord: Within limitations of motion degradation, no spinal cord signal abnormality is identified. Posterior Fossa, vertebral arteries, paraspinal tissues: Posterior fossa better assessed on concurrently performed brain MRI. Flow voids preserved within the imaged cervical vertebral arteries. Paraspinal soft tissues within normal limits. Disc levels: Unless otherwise stated, the level by level findings below have not significantly changed since prior MRI 11/14/2016. Multilevel disc degeneration. Most notably, moderate disc degeneration is present at C6-C7. C2-C3: No significant disc herniation or stenosis. C3-C4: Mild uncinate hypertrophy on the right. Minimal facet hypertrophy. No significant spinal canal stenosis or neural foraminal narrowing. C4-C5: Shallow disc bulge. Mild  uncovertebral hypertrophy, facet arthrosis and ligamentum flavum hypertrophy. No significant spinal canal stenosis. Progressive mild/moderate right neural foraminal narrowing. C5-C6: Mild endplate spurring and uncovertebral hypertrophy. Mild ligamentum flavum hypertrophy. No significant spinal canal stenosis. Mild/moderate right neural foraminal narrowing. C6-C7:  Shallow disc bulge with endplate spurring. Mild uncovertebral hypertrophy. Mild facet arthrosis and ligamentum flavum hypertrophy. No significant spinal canal stenosis. Mild right neural foraminal narrowing. C7-T1: No significant disc herniation or stenosis. IMPRESSION: Motion degraded examination, as described. Comparison is made to the prior cervical spine MRI of 11/14/2016. Cervical spondylosis, as outlined. No significant spinal canal stenosis. Progressive multifactorial mild/moderate right neural foraminal narrowing at C4-C5. Unchanged neural foraminal narrowing on the right at C5-C6 (mild/moderate), and on the right at C6-C7 (mild). Electronically Signed   By: Jackey LogeKyle  Golden DO   On: 07/14/2020 17:07   DG Chest Portable 1 View  Result Date: 07/14/2020 CLINICAL DATA:  Productive cough EXAM: PORTABLE CHEST 1 VIEW COMPARISON:  06/29/2020 FINDINGS: Previous median sternotomy and CABG. Heart size is normal. Aortic atherosclerotic calcification is present. No evidence of heart failure or effusion. Left lung remains clear. Patchy density at the right base consistent with mild pneumonia in the right lower lobe. No effusion. No acute bone finding. IMPRESSION: Previous CABG. Suspicion of mild patchy bronchopneumonia in the right lower lobe. Electronically Signed   By: Paulina FusiMark  Shogry M.D.   On: 07/14/2020 13:54   CT HEAD CODE STROKE WO CONTRAST  Result Date: 07/14/2020 CLINICAL DATA:  Code stroke. EXAM: CT HEAD WITHOUT CONTRAST TECHNIQUE: Contiguous axial images were obtained from the base of the skull through the vertex without intravenous contrast. COMPARISON:  None. FINDINGS: Brain: There is no acute intracranial hemorrhage, mass effect, or edema. Gray-white differentiation is preserved. Increased prominence of the ventricles and sulci reflects minor generalized parenchymal volume loss. Patchy hypoattenuation in the supratentorial white matter is nonspecific but probably reflects mild chronic microvascular  ischemic changes. No extra-axial collection. Vascular: No hyperdense vessel. There is intracranial atherosclerotic calcification at the skull base. Skull: Unremarkable. Sinuses/Orbits: No acute abnormality. Other: Mastoid air cells are clear. ASPECTS ([place]berta Stroke Program Early CT Score) - Ganglionic level infarction (caudate, lentiform nuclei, internal capsule, insula, M1-M3 cortex): 7 - Supraganglionic infarction (M4-M6 cortex): 3 Total score (0-10 with 10 being normal): 10 IMPRESSION: There is no acute intracranial hemorrhage or evidence of acute infarction. ASPECT score is 10. These results were communicated to Dr. Amada JupiterKirkpatrick at 12:58 pm on 07/14/2020 by text page via the [facility] messaging system. Electronically Signed   By: Guadlupe SpanishPraneil  Patel M.D.   On: 07/14/2020 13:00     Cora Collumaige, Thursa Emme J, DO 07/14/2020, 9:16 PM PGY-1, [facility] Intern pager: [phone], text pages welcome

## 2020-07-14 NOTE — Consult Note (Signed)
Neurology Consultation Reason for Consult: Code stroke Referring Physician: Hervey Ard  CC: Leg weakness  History is obtained from: Patient  HPI: Leah Pittman is a 87 y.o. female with a history of labile hypertension, heart attack, glaucoma who presents with leg weakness that started while she was seeing her doctor earlier today.  She states that she felt like she was almost going to pass out because he was so short of breath and in that setting she felt both of her legs were weak and crumbling.  Of note, she notes some bilateral leg numbness and leg cramps that have been coming and going for quite some time.  She has been severely short of breath and having "her heart hurting" for the past 2 weeks.  There was some concern for facial droop and therefore she was brought into the emergency department via EMS.  She was brought in as a code stroke.  On evaluation here, she initially appeared to have some right facial weakness, but with facial repositioning this was no longer noticeable.  Also, even if this is stroke given her description of feeling weak for days, her actual last known well would be unclear.   LKW: Unclear tpa given?: no, unclear time of onset   ROS: A 14 point ROS was performed and is negative except as noted in the HPI.   Past Medical History:  Diagnosis Date  . Anxiety   . Arthritis   . Chest pain 2011   CARDIOLITE - no significant symptoms, EKG changes, or arrhythmias  . Congenital prolapse of bladder mucosa   . Diverticulosis   . Dyspnea 02/16/2012   2D ECHO - EF 55-60%, normal  . Esophageal dysphagia   . Fatigue   . GERD (gastroesophageal reflux disease)   . Glaucoma   . Headache(784.0)   . Heart attack (HCC)   . Hiatal hernia   . High blood pressure 02/16/2012   RENAL DOPPLER - normal  . Hyperlipidemia   . Hypothyroidism   . Labile hypertension   . Lightheadedness   . Migraine headache   . Multiple allergies   . Myalgia   . OSA (obstructive sleep  apnea)   . Pain, lower leg    Calf  . Problem of menstruation   . Proteinuria   . Reflux   . SOB (shortness of breath)   . Thyroid disease   . Urinary problem   . Valvular heart disease   . Vitamin B12 deficiency   . Vitamin D deficiency      Family History  Problem Relation Age of Onset  . Heart attack Father   . Stroke Father   . Cancer Sister   . Cancer Brother   . Asthma Brother   . Cancer Brother      Social History:  reports that she has never smoked. She has never used smokeless tobacco. She reports that she does not drink alcohol and does not use drugs.   Exam: Current vital signs: BP (!) 119/43   Pulse (!) 58   Temp (!) 97.1 F (36.2 C) (Oral)   Resp 20   Wt 56.6 kg   SpO2 96%   BMI 21.42 kg/m  Vital signs in last 24 hours: Temp:  [96.7 F (35.9 C)-97.1 F (36.2 C)] 97.1 F (36.2 C) (05/25 1311) Pulse Rate:  [58-79] 58 (05/25 1400) Resp:  [20-22] 20 (05/25 1400) BP: (119-187)/(43-78) 119/43 (05/25 1400) SpO2:  [95 %-100 %] 96 % (05/25 1400) Weight:  [56.6  kg-56.7 kg] 56.6 kg (05/25 1200)   Physical Exam  Constitutional: Appears well-developed and well-nourished.  Psych: Appears exceedingly anxious Eyes: No scleral injection HENT: No OP obstruction MSK: no joint deformities.  Cardiovascular: Normal rate and regular rhythm.  Respiratory: Effort normal, non-labored breathing GI: Soft.  No distension. There is no tenderness.  Skin: WDI  Neuro: Mental Status: Patient is awake, alert, oriented to person, place, month, year, and situation. Patient is able to give a clear and coherent history. No signs of aphasia or neglect Cranial Nerves: II:   She has constriction in bilateral fields(she reports this is ongoing due to glaucoma), but no definite hemianopia. III,IV, VI: EOMI without ptosis or diploplia.  V: Facial sensation is symmetric to temperature VII: Facial movement is symmetric.  VIII: hearing is intact to voice X: Uvula elevates  symmetrically XI: Shoulder shrug is symmetric. XII: tongue is midline without atrophy or fasciculations.  Motor: Tone is normal. Bulk is normal. 5/5 strength was present in bilateral arms, and bilateral legs she has giveaway weakness. Sensory: Sensation is diminished in the knees bilaterally. Deep Tendon Reflexes: 2+ and symmetric in the biceps and patellae.  Plantars: Toes are downgoing bilaterally.  Cerebellar: FNF without past-pointing, but she does have significant tremor bilaterally.   I have reviewed labs in epic and the results pertinent to this consultation are: Creatinine 0.82  I have reviewed the images obtained: CT head was unremarkable  Impression: 87 year old female with presyncope in the setting of chest pain and shortness of breath and hyperventilation as well as bilateral hand numbness and?  Left leg weakness.  My suspicion is that this is not neurological in origin, however we certainly cannot ignore the degree of hypertension that she exhibited as a potential indicator that she may have something going on.  I would favor an MRI both of the brain and cervical spine, but if this is negative I would not pursue further neurological work-up at this time.  Recommendations: 1) MRI brain and C-spine 2) further work-up only if positive.   Ritta Slot, MD [facility] [phone]  If 7pm- 7am, please page neurology on call as listed in AMION.

## 2020-07-15 ENCOUNTER — Encounter (HOSPITAL_COMMUNITY): Payer: Self-pay | Admitting: Family Medicine

## 2020-07-15 ENCOUNTER — Inpatient Hospital Stay (HOSPITAL_COMMUNITY): Payer: Medicare Other

## 2020-07-15 ENCOUNTER — Telehealth: Payer: Self-pay

## 2020-07-15 DIAGNOSIS — I214 Non-ST elevation (NSTEMI) myocardial infarction: Secondary | ICD-10-CM | POA: Diagnosis not present

## 2020-07-15 LAB — BASIC METABOLIC PANEL
Anion gap: 5 (ref 5–15)
BUN: 10 mg/dL (ref 8–23)
CO2: 23 mmol/L (ref 22–32)
Calcium: 9.4 mg/dL (ref 8.9–10.3)
Chloride: 109 mmol/L (ref 98–111)
Creatinine, Ser: 0.69 mg/dL (ref 0.44–1.00)
GFR, Estimated: >60 mL/min (ref >60)
Glucose, Bld: 96 mg/dL (ref 70–99)
Potassium: 4.1 mmol/L (ref 3.5–5.1)
Sodium: 137 mmol/L (ref 135–145)

## 2020-07-15 LAB — ECHOCARDIOGRAM COMPLETE
AR max vel: 1.93 cm2
AV Area VTI: 1.79 cm2
AV Area mean vel: 1.84 cm2
AV Mean grad: 3 mmHg
AV Peak grad: 4.9 mmHg
Ao pk vel: 1.11 m/s
Area-P 1/2: 3.34 cm2
Calc EF: 70.5 %
Height: 64 in
MV M vel: 1.91 m/s
MV Peak grad: 14.6 mmHg
MV VTI: 1.5 cm2
S' Lateral: 2.2 cm
Single Plane A2C EF: 75.4 %
Single Plane A4C EF: 62 %
Weight: 1902.4 oz

## 2020-07-15 LAB — CBC
HCT: 34.5 % — ABNORMAL LOW (ref 36.0–46.0)
Hemoglobin: 11.9 g/dL — ABNORMAL LOW (ref 12.0–15.0)
MCH: 31.9 pg (ref 26.0–34.0)
MCHC: 34.5 g/dL (ref 30.0–36.0)
MCV: 92.5 fL (ref 80.0–100.0)
Platelets: 236 10*3/uL (ref 150–400)
RBC: 3.73 MIL/uL — ABNORMAL LOW (ref 3.87–5.11)
RDW: 14.8 % (ref 11.5–15.5)
WBC: 9.2 10*3/uL (ref 4.0–10.5)
nRBC: 0 % (ref 0.0–0.2)

## 2020-07-15 LAB — TSH: TSH: 2.291 u[IU]/mL (ref 0.350–4.500)

## 2020-07-15 LAB — HEPARIN LEVEL (UNFRACTIONATED)
Heparin Unfractionated: 0.39 IU/mL (ref 0.30–0.70)
Heparin Unfractionated: 0.28 IU/mL — ABNORMAL LOW (ref 0.30–0.70)

## 2020-07-15 LAB — SEDIMENTATION RATE: Sed Rate: 6 mm/hr (ref 0–22)

## 2020-07-15 LAB — CK: Total CK: 34 U/L — ABNORMAL LOW (ref 38–234)

## 2020-07-15 LAB — TROPONIN I (HIGH SENSITIVITY): Troponin I (High Sensitivity): 75 ng/L — ABNORMAL HIGH (ref <18)

## 2020-07-15 MED ORDER — PANTOPRAZOLE SODIUM 40 MG PO TBEC
40.0000 mg | DELAYED_RELEASE_TABLET | Freq: Every day | ORAL | Status: DC
  Administered 2020-07-15 – 2020-07-16 (×2): 40 mg via ORAL
  Filled 2020-07-15 (×2): qty 1

## 2020-07-15 MED ORDER — ENOXAPARIN SODIUM 40 MG/0.4ML IJ SOSY: 40.0000 mg | PREFILLED_SYRINGE | INTRAMUSCULAR | Status: DC

## 2020-07-15 MED ORDER — BUDESONIDE 0.25 MG/2ML IN SUSP
0.2500 mg | Freq: Two times a day (BID) | RESPIRATORY_TRACT | Status: DC
  Administered 2020-07-15: 0.25 mg via RESPIRATORY_TRACT
  Filled 2020-07-15 (×2): qty 2

## 2020-07-15 MED ORDER — ALBUTEROL SULFATE (2.5 MG/3ML) 0.083% IN NEBU
2.5000 mg | INHALATION_SOLUTION | Freq: Two times a day (BID) | RESPIRATORY_TRACT | Status: DC
  Administered 2020-07-15: 2.5 mg via RESPIRATORY_TRACT
  Filled 2020-07-15: qty 3

## 2020-07-15 MED ORDER — LOSARTAN POTASSIUM 25 MG PO TABS
25.0000 mg | ORAL_TABLET | Freq: Every day | ORAL | Status: DC
  Administered 2020-07-15 – 2020-07-16 (×2): 25 mg via ORAL
  Filled 2020-07-15 (×2): qty 1

## 2020-07-15 NOTE — Progress Notes (Signed)
ANTICOAGULATION CONSULT NOTE - Follow Up Consult  Pharmacy Consult for heparin Indication: chest pain/ACS  Labs: Recent Labs    07/14/20 1246 07/14/20 1254 07/14/20 1511 07/14/20 1742 07/15/20 0413  HGB 13.1 12.9  --   --  11.9*  HCT 39.5 38.0  --   --  34.5*  PLT 256  --   --   --  236  APTT 22*  --   --   --   --   LABPROT 12.0  --   --   --   --   INR 0.9  --   --   --   --   HEPARINUNFRC  --   --   --   --  0.39  CREATININE 0.82 0.60  --   --   --   TROPONINIHS  --   --  90* 153*  --     Assessment/Plan:  87yo female therapeutic on heparin with initial dosing for CP. Will continue gtt at current rate of 650 units/hr and confirm stable with additional level.    Vernard Gambles, PharmD, BCPS  07/15/2020,5:14 AM

## 2020-07-15 NOTE — Progress Notes (Signed)
Family Medicine Teaching Service Daily Progress Note Intern Pager: [phone]  Patient name: Leah Pittman record number: [id number] Date of birth: April 29, 1930 Age: 87 y.o. Gender: female  Primary Care Provider: Rochel Brome, MD Consultants: Pulmonology (s/o) Code Status: DNR  Pt Overview and Major Events to Date:  5/25: Admitted  Assessment and Plan: Anesia Blackwell Campbellis a 87 y.o.femalewho presented with SOB and CP concerning for ACS. PMH is significant forHTN, HLD, multi vessal CAD s/p CABG, hypothyroidism, Vit B12 deficiency, GERD, esophageal dysphagia, anxiety.  Dyspnea  Deconditioning Seen by Pulmonologist yesterday who feels that patients presentation is most likely related to deconditioning, chronic aspiration/aspiration bronchiectasis. There may also be an emergence of asthma given atopic symptoms though will need formal PFT's outpatient. Recommendations are to give formoterol and budesonide nebs while admitted and ICS/LABA inhaler on discharge. Opting not to treat for CAP as patient recently treated with abx. Additionally, without leukocytosis and afebrile since admission. Has been stable on room air without need for supplemental oxygen thus far. Will check pulse ox with ambulation to assess further if patient has need for supplemental oxygen. -Pulmonology has signed off, will follow outpatient  -Pulmicort and arformoterol nebulizer BID  -Albuterol inhaler 2 puffs q4h PRN wheezing, SOB -ICS/LABA inhaler on d/c  -Outpatient pulmonology follow up  -Ambulatory pulse ox -PT: patient declined HHPT -OT: HHOT, no equipment recommendations  Hypertension BP's 134-169/49-61. Losartan 25 mg started yesterday. Patient is high fall risk and want to avoid hypotension so will opt to not have strict BP control at least inpatient.  -Continue Losartan 25 mg -Follow up with Cardiology/PCP outpatient   HFpEF, EF now 60-65%: Chronic, stable Improved from her previous echo from April.  Continues to have Grade I DD.  Not likely to be contributing to her dyspnea. -Follow up with Cardiology outpatient   Generalized Weakness Has been a chronic and on-going issue. More-so in lower extremities, also with generalized numbness. Vitamin B12 and folate normal on 4/25. Vitamin B12 again within normal limits on 5/25. Methylmalonic acid also within normal limits. ESR and CK unremarkable. Patient was evaluated by PT yesterday but declined HHPT. She is a fall risk.  -Fall precautions -OOB with assistance  CAD s/p CABG x4  HLD  -Continue home ASA and simvastatin  -Nitroglycerin 0.4 mg sublingual PRN chest pain  GERD  Hx esophageal stricture  With reported difficulty swallowing and abdominal bloating.  -Continue Pantoprazole 40 mg daily -Has been referred to GI outpatient by PCP -SLP consult  -Zofran 4 mg BID PRN nausea, vomiting   Hypothyroidism TSH within normal limits.  -Continue home Synthroid   IBS with constipation -MiraLAX 17g BID  Glaucoma: chronic, stable -Continue home latanoprost, cosopt    FEN/GI: Heart Healthy PPx: Lovenox   Status is: Inpatient  Remains inpatient appropriate because:Needs SLP eval and ambulatory pulse ox   Dispo: The patient is from: Home              Anticipated d/c is to: Home              Patient currently is not medically stable to d/c. Pending ambulatory pulse ox and SLP consult   Difficult to place patient No    Subjective:  Patient reports feeling a little bit better this morning.  She continues to have some difficulty with swallowing and feels short of breath even while talking.  States that she declined home health PT because she is worked with them in the past and is familiar with the  exercises she needs to do at home.  Objective: Temp:  [97.3 F (36.3 C)-97.8 F (36.6 C)] 97.8 F (36.6 C) (05/27 0448) Pulse Rate:  [58-72] 58 (05/27 0448) Resp:  [17-20] 17 (05/27 0448) BP: (134-169)/(49-61) 139/55 (05/27 0448) SpO2:   [98 %-100 %] 99 % (05/27 0448) Weight:  [53.5 kg] 53.5 kg (05/27 0500) Physical Exam: General: Elderly, frail-appearing, dyspneic Cardiovascular: RRR, no murmur Respiratory: Faint crackles bilateral bases, otherwise without wheezing/rhonchi Extremities: No edema, warm and dry  Laboratory: Recent Labs  Lab 07/14/20 1246 07/14/20 1254 07/15/20 0413 07/16/20 0225  WBC 10.5  --  9.2 8.5  HGB 13.1 12.9 11.9* 12.1  HCT 39.5 38.0 34.5* 36.2  PLT 256  --  236 242   Recent Labs  Lab 07/14/20 1246 07/14/20 1254 07/15/20 0413  NA 136 137 137  K 3.7 3.7 4.1  CL 105 106 109  CO2 20*  --  23  BUN $Re'13 15 10  'kZa$ CREATININE 0.82 0.60 0.69  CALCIUM 9.9  --  9.4  PROT 6.7  --   --   BILITOT 1.0  --   --   ALKPHOS 58  --   --   ALT 21  --   --   AST 21  --   --   GLUCOSE 117* 109* 96   Imaging/Diagnostic Tests: ECHOCARDIOGRAM COMPLETE  Result Date: 07/15/2020    ECHOCARDIOGRAM REPORT   Patient Name:   Leah Pittman Date of Exam: 07/15/2020 Medical Rec #:  [id number]       Height:       64.0 in Accession #:    [id number]      Weight:       118.9 lb Date of Birth:  September 28, 1930      BSA:          1.568 m Patient Age:    52 years        BP:           156/59 mmHg Patient Gender: F               HR:           64 bpm. Exam Location:  Inpatient Procedure: 2D Echo, Cardiac Doppler and Color Doppler Indications:    NSTEMI  History:        Patient has prior history of Echocardiogram examinations, most                 recent 05/25/2020. Previous Myocardial Infarction,                 Signs/Symptoms:Shortness of Breath, Chest Pain, Dyspnea and                 Murmur; Risk Factors:Dyslipidemia.  Sonographer:    Luisa Hart RDCS Referring Phys: [place]  1. Left ventricular ejection fraction, by estimation, is 60 to 65%. The left ventricle has normal function. The left ventricle has no regional wall motion abnormalities. Left ventricular diastolic parameters are consistent with Grade  I diastolic dysfunction (impaired relaxation).  2. Right ventricular systolic function is normal. The right ventricular size is normal. There is normal pulmonary artery systolic pressure. The estimated right ventricular systolic pressure is 82.9 mmHg.  3. The mitral valve is grossly normal. Trivial mitral valve regurgitation. No evidence of mitral stenosis.  4. The aortic valve is tricuspid. Aortic valve regurgitation is not visualized. No aortic stenosis is present.  5. The inferior vena cava is normal  in size with greater than 50% respiratory variability, suggesting right atrial pressure of 3 mmHg. Comparison(s): No significant change from prior study. FINDINGS  Left Ventricle: Left ventricular ejection fraction, by estimation, is 60 to 65%. The left ventricle has normal function. The left ventricle has no regional wall motion abnormalities. The left ventricular internal cavity size was normal in size. There is  no left ventricular hypertrophy. Abnormal (paradoxical) septal motion consistent with post-operative status. Left ventricular diastolic parameters are consistent with Grade I diastolic dysfunction (impaired relaxation). Right Ventricle: The right ventricular size is normal. No increase in right ventricular wall thickness. Right ventricular systolic function is normal. There is normal pulmonary artery systolic pressure. The tricuspid regurgitant velocity is 2.08 m/s, and  with an assumed right atrial pressure of 3 mmHg, the estimated right ventricular systolic pressure is 37.9 mmHg. Left Atrium: Left atrial size was normal in size. Right Atrium: Right atrial size was normal in size. Pericardium: Trivial pericardial effusion is present. Mitral Valve: The mitral valve is grossly normal. Trivial mitral valve regurgitation. No evidence of mitral valve stenosis. MV peak gradient, 4.6 mmHg. The mean mitral valve gradient is 2.0 mmHg. Tricuspid Valve: The tricuspid valve is grossly normal. Tricuspid valve  regurgitation is trivial. No evidence of tricuspid stenosis. Aortic Valve: The aortic valve is tricuspid. Aortic valve regurgitation is not visualized. No aortic stenosis is present. Aortic valve mean gradient measures 3.0 mmHg. Aortic valve peak gradient measures 4.9 mmHg. Aortic valve area, by VTI measures 1.79 cm. Pulmonic Valve: The pulmonic valve was grossly normal. Pulmonic valve regurgitation is trivial. No evidence of pulmonic stenosis. Aorta: The aortic root and ascending aorta are structurally normal, with no evidence of dilitation. Venous: The right upper pulmonary vein is normal. The inferior vena cava is normal in size with greater than 50% respiratory variability, suggesting right atrial pressure of 3 mmHg. IAS/Shunts: The atrial septum is grossly normal.  LEFT VENTRICLE PLAX 2D LVIDd:         3.60 cm     Diastology LVIDs:         2.20 cm     LV e' medial:    5.43 cm/s LV PW:         0.90 cm     LV E/e' medial:  16.0 LV IVS:        1.00 cm     LV e' lateral:   7.22 cm/s LVOT diam:     1.70 cm     LV E/e' lateral: 12.0 LV SV:         46 LV SV Index:   30 LVOT Area:     2.27 cm  LV Volumes (MOD) LV vol d, MOD A2C: 43.5 ml LV vol d, MOD A4C: 38.4 ml LV vol s, MOD A2C: 10.7 ml LV vol s, MOD A4C: 14.6 ml LV SV MOD A2C:     32.8 ml LV SV MOD A4C:     38.4 ml LV SV MOD BP:      29.8 ml RIGHT VENTRICLE RV Basal diam:  3.20 cm RV Mid diam:    2.00 cm RV S prime:     8.11 cm/s TAPSE (M-mode): 0.4 cm LEFT ATRIUM             Index       RIGHT ATRIUM           Index LA diam:        2.70 cm 1.72 cm/m  RA Area:  12.40 cm LA Vol (A2C):   36.8 ml 23.46 ml/m RA Volume:   27.10 ml  17.28 ml/m LA Vol (A4C):   25.8 ml 16.45 ml/m LA Biplane Vol: 33.0 ml 21.04 ml/m  AORTIC VALVE                   PULMONIC VALVE AV Area (Vmax):    1.93 cm    PV Vmax:       0.90 m/s AV Area (Vmean):   1.84 cm    PV Vmean:      63.400 cm/s AV Area (VTI):     1.79 cm    PV VTI:        0.196 m AV Vmax:           111.00 cm/s PV Peak  grad:  3.2 mmHg AV Vmean:          78.000 cm/s PV Mean grad:  2.0 mmHg AV VTI:            0.258 m AV Peak Grad:      4.9 mmHg AV Mean Grad:      3.0 mmHg LVOT Vmax:         94.30 cm/s LVOT Vmean:        63.400 cm/s LVOT VTI:          0.204 m LVOT/AV VTI ratio: 0.79  AORTA Ao Root diam: 2.60 cm Ao Asc diam:  2.70 cm MITRAL VALVE                TRICUSPID VALVE MV Area (PHT): 3.34 cm     TR Peak grad:   17.3 mmHg MV Area VTI:   1.50 cm     TR Vmax:        208.00 cm/s MV Peak grad:  4.6 mmHg MV Mean grad:  2.0 mmHg     SHUNTS MV Vmax:       1.07 m/s     Systemic VTI:  0.20 m MV Vmean:      56.3 cm/s    Systemic Diam: 1.70 cm MV Decel Time: 227 msec MR Peak grad: 14.6 mmHg MR Vmax:      191.00 cm/s MV E velocity: 87.00 cm/s MV A velocity: 102.00 cm/s MV E/A ratio:  0.85 Eleonore Chiquito MD Electronically signed by Eleonore Chiquito MD Signature Date/Time: 07/15/2020/12:31:13 PM    Final      Sharion Settler, DO 07/16/2020, 5:42 AM PGY-1, [place] Intern pager: [phone], text pages welcome

## 2020-07-15 NOTE — Progress Notes (Addendum)
Family Medicine Teaching Service Daily Progress Note Intern Pager: [phone]  Patient name: Leah Pittman record number: [id number] Date of birth: [birth year] Age: 87 y.o. Gender: female  Primary Care Provider: Rochel Brome, MD Consultants: Cardiology Code Status: DNR  Pt Overview and Major Events to Date:  5/25: Admitted  Assessment and Plan: Leah Pittman is a 87 y.o. female who presented with SOB and CP concerning for ACS. PMH is significant for HTN, HLD, multi vessal CAD s/p CABG, hypothyroidism, Vit B12 deficiency, GERD, esophageal dysphagia, anxiety.   CP, Dyspnea at rest  Overall Deconditioning Started on Heparin gtt yesterday. Reassuringly Troponin's down-trended  90>153>75. EKG also without acute findings concerning for STEMI or NSTEMI though had EKG done in PCP office prior to arrival which demonstrated new inverted t-waves in V3, mild depression of ST in V5. Patient received Nitro x1, ASA 81 mg x2 at that time. At this time, do not feel that her symptoms are cardiac in origin. Patient had recent appointment with her cardiologist, Dr. Sallyanne Kuster, on 4/12, and had similar complaints of CP and dyspnea at rest at that time. He also did not feel that symptoms were cardiac in origin. Given dyspnea at rest without clear cardiac etiology, will discuss with Pulmonology.  -Stop heparin gtt  -Pulmonology consult  -F/u echo  -Check ESR, CK -PT/OT eval and treat  Hypertension BP's ranging 110-187/42-93. At PCP office yesterday BP had jumped to 210/102 and patient was SOB, clammy and diaphoretic. Home medication includes Losartan 100 mg daily- patient reported to me taking this according to her BP readings at home. Would take 1/4-1/2 according to her SBP as directed by her cardiologist. Per chart review, appears she had renal artery duplex in 2013 which did not show evidence of renal artery stenosis.  -Monitor BP's  -Start Losartan 25 mg daily   Shortness of Breath, Cough  CXR  concerning for bronchopneumonia in the right lower lobe. Patient received a dose of Azithromycin and CTX in the ED. Previously treated with Augmentin, Prednisone and Beztri at PCP office for reported COPD exacerbation but unclear if patient actually has official diagnosis of COPD. CBC without leukocytosis, patient continues to be afebrile and stable on room air. Home medication notable for [facility] inhaler. Her SOB at rest is more-so concerning for cardiac etiology and has been ongoing for a couple months so less likely acute infection. -Opting to hold further abx for now -Will discuss with pulmology  HFpEF, EF 55-60%, G1DD Seen on Echo 4/5. BNP elevated at 260.7. Follows with Dr. Sallyanne Kuster. -F/u echo -Strict I/O -Daily wights  Weakness  Hx Vitamin B12 Deficiency Initially code stroke was called as patient was thought to have increased weakness and facial droop by EMS. CT Head negative. Neuro consulted and recommended further imaging with MRI brain and cervical spine, if negative defers additional neurological workup. MRI brain with generalized atrophy and chronic microvascular changes. MRI cervical spine obtained yesterday which showed spondylosis without significant stenosis. No acute findings to suggest acute neurological pathology. Patient does have hx of B12 deficiency which could attribute to some of her symptoms. Examination notable for unsteady gait and difficulty with standing from bed. Does not fatigue with repeated stimuli in upper extremities and denies hx of eye drooping, so not consistent with myesthenia gravis.  -Continue Vitamin B12 supplementation  -Fall precautions -PT/OT eval and treat  CAD s/p CABG x4  HLD 07/10/2018, [place],LIMA to LAD, SVG to diagonal, sequential SVG to OM1+OM 2. This was complicated by post-operative  atrial fibrillation though she has had no recurrence since the immediate post-operative period. She is not on anticoagulation. LDL goal <100.  -Continue home ASA  and simvastatin   IBS with constipation -Continue MiraLAX BID  GERD: chronic, stable -Continue on home Pantoprazole 40 mg daily  Hypothyroidism -Continue home Synthroid  Glaucoma -Continue home latanoprost, cosopt   FEN/GI: Heart Healthy PPx: Heparini gtt ; can likely stop but will discuss with cardiology first   Status is: Inpatient  Remains inpatient appropriate because:Ongoing diagnostic testing needed not appropriate for outpatient work up and Inpatient level of care appropriate due to severity of illness   Dispo: The patient is from: Home              Anticipated d/c is to: Home              Patient currently is not medically stable to d/c.   Difficult to place patient No   Subjective:  Patient reports that she feels a little better today than she was yesterday.  She continues to have a nonproductive cough.  States that she wakes up feeling congested on most mornings.  Has felt short of breath in the last 2 months, worsened yesterday.  Reports previously being able to do all independent activities of daily living without difficulty or assistance.  More recently, she has had to hold onto railings and walls for support and has had to take more breaks when doing things such as making the bed.  She gets short of breath upon walking just a few feet.  She denies any chest pain.  Reports that she did have some central and right-sided chest pain yesterday that she attributes to congestion.  She has had an ongoing cough ever since her cardiac procedures 2 years ago.  Objective: Temp:  [96.7 F (35.9 C)-98 F (36.7 C)] 97.5 F (36.4 C) (05/26 0432) Pulse Rate:  [57-79] 62 (05/26 0433) Resp:  [14-29] 20 (05/26 0433) BP: (110-187)/(42-93) 156/59 (05/26 0433) SpO2:  [95 %-100 %] 98 % (05/26 0433) Weight:  [53.9 kg-56.7 kg] 53.9 kg (05/26 0433) Physical Exam: General: Elderly, speaking in short small phrases  Cardiovascular: RRR without murmur Respiratory: decreased breath sounds  in all bases, dyspnic at rest, saturating well on room air, intermittent non-productive cough Extremities: without edema, weakness  Neuro: without focal deficit, 5/5 strength upper extremities, lower extremities 4/5 strength b/l, +decreased sensation to b/l lower extremities, gait is unsteady, generalized weakness/difficulty getting up from bed    Laboratory: Recent Labs  Lab 07/14/20 1246 07/14/20 1254 07/15/20 0413  WBC 10.5  --  9.2  HGB 13.1 12.9 11.9*  HCT 39.5 38.0 34.5*  PLT 256  --  236   Recent Labs  Lab 07/14/20 1246 07/14/20 1254 07/15/20 0413  NA 136 137 137  K 3.7 3.7 4.1  CL 105 106 109  CO2 20*  --  23  BUN _0 CREATININE 0.82 0.60 0.69  CALCIUM 9.9  --  9.4  PROT 6.7  --   --   BILITOT 1.0  --   --   ALKPHOS 58  --   --   ALT 21  --   --   AST 21  --   --   GLUCOSE 117* 109* 96    Imaging/Diagnostic Tests: MR BRAIN WO CONTRAST  Result Date: 07/14/2020 CLINICAL DATA:  Neuro deficit, acute, stroke suspected. EXAM: MRI HEAD WITHOUT CONTRAST TECHNIQUE: Multiplanar, multiecho pulse sequences of the brain and surrounding  structures were obtained without intravenous contrast. COMPARISON:  Prior head CT 07/14/2020.  Brain MRI 11/14/2016. FINDINGS: Brain: Mild cerebral and cerebellar atrophy. Mild multifocal T2/FLAIR hyperintensity within the cerebral white matter is nonspecific, but compatible with chronic small vessel ischemic disease. There is no acute infarct. No evidence of intracranial mass. No chronic intracranial blood products. No extra-axial fluid collection. No midline shift. Vascular: Expected proximal arterial flow voids. Skull and upper cervical spine: No focal marrow lesion. Sinuses/Orbits: Visualized orbits show no acute finding. Bilateral lens replacements. Trace bilateral ethmoid and right maxillary sinus mucosal thickening. Frothy secretions within the left sphenoid sinus. IMPRESSION: No evidence of acute intracranial abnormality. Mild generalized  parenchymal atrophy and cerebral white matter chronic small vessel ischemic disease, stable as compared to the brain MRI of 11/14/2016. Mild paranasal sinus disease, as described. Electronically Signed   By: Kellie Simmering DO   On: 07/14/2020 17:00   MR CERVICAL SPINE WO CONTRAST  Result Date: 07/14/2020 CLINICAL DATA:  Myelopathy, acute or progressive. EXAM: MRI CERVICAL SPINE WITHOUT CONTRAST TECHNIQUE: Multiplanar, multisequence MR imaging of the cervical spine was performed. No intravenous contrast was administered. COMPARISON:  CT of the cervical spine 07/24/2019. Cervical spine MRI 11/14/2016. FINDINGS: Intermittently motion degraded exam. Most notably, there is moderate motion degradation of the axial T2 TSE sequence. Alignment: Straightening of the expected cervical lordosis. No significant spondylolisthesis. Vertebrae: Vertebral body height is maintained. No significant marrow edema or focal suspicious osseous lesion. Cord: Within limitations of motion degradation, no spinal cord signal abnormality is identified. Posterior Fossa, vertebral arteries, paraspinal tissues: Posterior fossa better assessed on concurrently performed brain MRI. Flow voids preserved within the imaged cervical vertebral arteries. Paraspinal soft tissues within normal limits. Disc levels: Unless otherwise stated, the level by level findings below have not significantly changed since prior MRI 11/14/2016. Multilevel disc degeneration. Most notably, moderate disc degeneration is present at C6-C7. C2-C3: No significant disc herniation or stenosis. C3-C4: Mild uncinate hypertrophy on the right. Minimal facet hypertrophy. No significant spinal canal stenosis or neural foraminal narrowing. C4-C5: Shallow disc bulge. Mild uncovertebral hypertrophy, facet arthrosis and ligamentum flavum hypertrophy. No significant spinal canal stenosis. Progressive mild/moderate right neural foraminal narrowing. C5-C6: Mild endplate spurring and  uncovertebral hypertrophy. Mild ligamentum flavum hypertrophy. No significant spinal canal stenosis. Mild/moderate right neural foraminal narrowing. C6-C7: Shallow disc bulge with endplate spurring. Mild uncovertebral hypertrophy. Mild facet arthrosis and ligamentum flavum hypertrophy. No significant spinal canal stenosis. Mild right neural foraminal narrowing. C7-T1: No significant disc herniation or stenosis. IMPRESSION: Motion degraded examination, as described. Comparison is made to the prior cervical spine MRI of 11/14/2016. Cervical spondylosis, as outlined. No significant spinal canal stenosis. Progressive multifactorial mild/moderate right neural foraminal narrowing at C4-C5. Unchanged neural foraminal narrowing on the right at C5-C6 (mild/moderate), and on the right at C6-C7 (mild). Electronically Signed   By: Kellie Simmering DO   On: 07/14/2020 17:07   DG Chest Portable 1 View  Result Date: 07/14/2020 CLINICAL DATA:  Productive cough EXAM: PORTABLE CHEST 1 VIEW COMPARISON:  06/29/2020 FINDINGS: Previous median sternotomy and CABG. Heart size is normal. Aortic atherosclerotic calcification is present. No evidence of heart failure or effusion. Left lung remains clear. Patchy density at the right base consistent with mild pneumonia in the right lower lobe. No effusion. No acute bone finding. IMPRESSION: Previous CABG. Suspicion of mild patchy bronchopneumonia in the right lower lobe. Electronically Signed   By: Nelson Chimes M.D.   On: 07/14/2020 13:54   CT HEAD CODE STROKE  WO CONTRAST  Result Date: 07/14/2020 CLINICAL DATA:  Code stroke. EXAM: CT HEAD WITHOUT CONTRAST TECHNIQUE: Contiguous axial images were obtained from the base of the skull through the vertex without intravenous contrast. COMPARISON:  None. FINDINGS: Brain: There is no acute intracranial hemorrhage, mass effect, or edema. Gray-white differentiation is preserved. Increased prominence of the ventricles and sulci reflects minor  generalized parenchymal volume loss. Patchy hypoattenuation in the supratentorial white matter is nonspecific but probably reflects mild chronic microvascular ischemic changes. No extra-axial collection. Vascular: No hyperdense vessel. There is intracranial atherosclerotic calcification at the skull base. Skull: Unremarkable. Sinuses/Orbits: No acute abnormality. Other: Mastoid air cells are clear. ASPECTS (Leslie Stroke Program Early CT Score) - Ganglionic level infarction (caudate, lentiform nuclei, internal capsule, insula, M1-M3 cortex): 7 - Supraganglionic infarction (M4-M6 cortex): 3 Total score (0-10 with 10 being normal): 10 IMPRESSION: There is no acute intracranial hemorrhage or evidence of acute infarction. ASPECT score is 10. These results were communicated to Dr. Leonel Ramsay at 12:58 pm on 07/14/2020 by text page via the [facility] messaging system. Electronically Signed   By: Macy Mis M.D.   On: 07/14/2020 13:00     Sharion Settler, DO 07/15/2020, 6:12 AM PGY-1, [place] Intern pager: [phone], text pages welcome

## 2020-07-15 NOTE — Progress Notes (Signed)
*  PRELIMINARY RESULTS* Echocardiogram 2D Echocardiogram has been performed.  Neomia Dear RDCS 07/15/2020, 11:02 AM

## 2020-07-15 NOTE — Hospital Course (Addendum)
Leah Pittman is a 87 y.o. female who presented with SOB and CP concerning for ACS. PMH is significant for HTN, HLD, multi vessal CAD s/p CABG, hypothyroidism, Vit B12 deficiency, GERD, esophageal dysphagia, anxiety.   Acute on Chronic Dyspnea  Deconditioning Initially treated as NSTEMI, given ASA, Nitro and started on Heparin gtt. Troponin peaked at 153, EKG without any ST changes. Patient was stable and thus felt less-cardiac in origin given the fact that she is followed closely with cardiologist and appears to have had these chronic ongoing symptoms that have been previously worked up. Did have repeat echo during hospitalization which showed improved EF of 60-65% with G1DD. Pulmonology consulted and felt this was multifactorial from deconditioning and chronic aspiration/aspiration bronchiectasis. Also potential asthma component so patient was patient started on ICS/LABA nebulizers inpatient. Did not require supplemental oxygen. Ambulated with pulse ox 93%. Discharged with Albuterol PRN and Symbicort to take twice daily. Should follow with pulmonology outpatient.    GERD  Hx Esophageal Stricture  Concern for chronic aspiration Chest x-ray concerning for right lower lobe bronchopneumonia. Patient had recently been treated outpatient with antibiotics. She remained afebrile throughout hospitalization without leukocytosis. Received a dose of Azithromycin and CTX in ED but this was not continued as this appeared to be more chronic. SLP consult was made and after their assessment they recommended modified barium swallow study. That showed no aspiration or laryngeal penetration. Patient was continued on her home Protonix.    Generalized Weakness Initially called in as a code stroke since EMS was concerned about some facial weakness. CT head negative, MRI brain negative and with resolution of symptoms. Neurology was consulted but further workup was deferred as this seemed less likely neurological. Patient  has had ongoing generalized weakness. Her B12 and folate were normal. ESR and CK were normal. PT/OT were consulted during hospitalization, unfortunately patient declined HHPT. Referral for HHOT given on d/c.    Follow Up Recommendations:  Recommend patient follow up with GI, understand referral has already been placed for this. Needs to follow up with Pulmology. Will need formal PFT's.  Would recommend PT for generalized weakness and deconditioning (patient declined [facility])

## 2020-07-15 NOTE — Evaluation (Signed)
Physical Therapy Evaluation Patient Details Name: Leah Pittman MRN: [id number] DOB: 08/26/1930 Today's Date: 07/15/2020   History of Present Illness  Pt is an 87 y.o. female admitted 07/14/20 with SOB and chest pain. CXR concerning for bronchopneumonia in RLL. Workup for acute on chronic dyspnea likely secondary to deconditioning, chronic sinus drainage and GERD with acute bronchitis vs PNA. PMH includes glaucoma (legally blind), HTN, HLD, CAD s/p CABG, dysphagia, anxiety.    Clinical Impression  Pt presents with an overall decrease in functional mobility secondary to above. PTA, pt reports independent with ambulation and ADLs, lives with husband who assists as needed. Today, pt very unstable ambulating without DME, requiring frequent assist to prevent LOB; required encouragement to trial gait training with RW; stability much improved ambulating with RW, intermittent min guard for balance. Educ re: fall risk reduction, DME and activity recommendations. Pt would benefit from continued acute PT services to maximize functional mobility and independence prior to d/c home; pt pleasantly declining follow-up PT services.    Follow Up Recommendations No PT follow up;Supervision for mobility/OOB (declined HHPT)    Equipment Recommendations  None recommended by PT    Recommendations for Other Services       Precautions / Restrictions Precautions Precautions: Fall;Other (comment) Precaution Comments: H/o glaucoma with low vision Restrictions Weight Bearing Restrictions: No      Mobility  Bed Mobility Overal bed mobility: Independent                  Transfers Overall transfer level: Needs assistance Equipment used: None Transfers: Sit to/from Stand Sit to Stand: Supervision         General transfer comment: Supervision for safety  Ambulation/Gait Ambulation/Gait assistance: Min guard;Min assist Gait Distance (Feet): 100 Feet (+120) Assistive device: None;Rolling walker  (2 wheeled) Gait Pattern/deviations: Step-through pattern;Decreased stride length Gait velocity: Decreased   General Gait Details: Initial gait trial without DME, slow, very unsteady gait with intermittent knee instability and LOB requiring frequent minA to prevent fall; additional trial with RW, although pt initially reluctant to try, stability much improved with min guard for balance  Stairs            Wheelchair Mobility    Modified Rankin (Stroke Patients Only)       Balance Overall balance assessment: Needs assistance Sitting-balance support: No upper extremity supported;Feet supported Sitting balance-[name]: Good Sitting balance - Comments: Indep to don/doff shoes, socks and underwear sitting EOB   Standing balance support: No upper extremity supported;During functional activity Standing balance-[name]: Fair Standing balance comment: Can static stand and ambulate without DME, unstable                             Pertinent Vitals/Pain Pain Assessment: No/denies pain Pain Intervention(s): Monitored during session    Home Living Family/patient expects to be discharged to:: Private residence Living Arrangements: Spouse/significant other Available Help at Discharge: Family;Available 24 hours/day Type of Home: Apartment (condo) Home Access: Level entry     Home Layout: One level Home Equipment: Walker - 2 wheels;Bedside commode;Tub bench;Grab bars - toilet;Grab bars - tub/shower      Prior Function Level of Independence: Independent               Hand Dominance   Dominant Hand: Right    Extremity/Trunk Assessment   Upper Extremity Assessment Upper Extremity Assessment: Overall WFL for tasks assessed    Lower Extremity Assessment Lower Extremity Assessment:  Generalized weakness    Cervical / Trunk Assessment Cervical / Trunk Assessment: Normal  Communication   Communication: No difficulties  Cognition Arousal/Alertness:  Awake/alert Behavior During Therapy: WFL for tasks assessed/performed Overall Cognitive Status: Within Functional Limits for tasks assessed                                        General Comments General comments (skin integrity, edema, etc.): VSS on RA    Exercises     Assessment/Plan    PT Assessment Patient needs continued PT services  PT Problem List Decreased strength;Decreased activity tolerance;Decreased balance;Decreased mobility;Decreased knowledge of use of DME       PT Treatment Interventions DME instruction;Gait training;Stair training;Functional mobility training;Therapeutic activities;Therapeutic exercise;Balance training;Patient/family education    PT Goals (Current goals can be found in the Care Plan section)  Acute Rehab PT Goals Patient Stated Goal: Return home, hopeful to not have to use DME to walk PT Goal Formulation: With patient Time For Goal Achievement: 07/29/20 Potential to Achieve Goals: Good    Frequency Min 3X/week   Barriers to discharge        Co-evaluation               AM-PAC PT "6 Clicks" Mobility  Outcome Measure Help needed turning from your back to your side while in a flat bed without using bedrails?: None Help needed moving from lying on your back to sitting on the side of a flat bed without using bedrails?: None Help needed moving to and from a bed to a chair (including a wheelchair)?: A Little Help needed standing up from a chair using your arms (e.g., wheelchair or bedside chair)?: A Little Help needed to walk in hospital room?: A Little Help needed climbing 3-5 steps with a railing? : A Little 6 Click Score: 20    End of Session Equipment Utilized During Treatment: Gait belt Activity Tolerance: Patient tolerated treatment well Patient left: in bed;with call bell/phone within reach;with bed alarm set;with nursing/sitter in room Nurse Communication: Mobility status PT Visit Diagnosis: Other abnormalities  of gait and mobility (R26.89);Muscle weakness (generalized) (M62.81)    Time: 1610-9604 PT Time Calculation (min) (ACUTE ONLY): 24 min   Charges:   PT Evaluation $PT Eval Low Complexity: 1 Low PT Treatments $Gait Training: 8-22 mins       Ina Homes, PT, DPT Acute Rehabilitation Services  Pager [phone] Office [phone]  Malachy Chamber 07/15/2020, 5:36 PM

## 2020-07-15 NOTE — Consult Note (Addendum)
NAME:  Leah Pittman, MRN:  [id number], DOB:  [birth year], LOS: 1 ADMISSION DATE:  07/14/2020, CONSULTATION DATE:  07/15/20 REFERRING MD:  Leah Pittman, CHIEF COMPLAINT:  Shortness of breath   History of Present Illness:  87 year old female with PMH of CAD s/p CABG, IBS, HTN, HL, GERD,  Hypothyroidism who presented to the ED on 5/25 with hypertensive emergency, chest pain and shortness of breath .  She was treated as possible NSTEMI and started on a heparin GTT, troponins peaked at 153 and are downtrending.  In the last 3 weeks she had noticed increased exertional shortness of breath with productive cough and had seen her PCP, no fever and Covid-19 negative.  She was given ceftriaxone and Kenalog injections and treated with Augmentin and prednisone steroid pack along with Breztri inhaler.   She reports no improvement with these interventions.   Since admission, she has been treated with Ceftriaxone and Azithromycin for possible bronchopneumonia on CXR and been on room air.  Her echo showed HFpEF with EF 60-65%.  She feels slightly improved, though still with some productive cough.  It was felt that her dyspnea was not cardiac related, therefore PCCM consulted   Pt notes that her shortness of breath initially began about the time of her CABG two years ago.  She has never been a smoker and never had PFT's, though has a diagnosis of COPD in the last year.  No occupational exposures.   Exertion makes her shortness of breath worse and rest improves, reports chronic sinus drainage and history of esophageal stricture and GERD along with pancreatic ductal dilation under surveillance.   Pt states she has been less active over the last year, she used to walk regularly and now can hardly make it around the block.    Pertinent  Medical History   has a past medical history of Anxiety, Arthritis, Chest pain (2011), Congenital prolapse of bladder mucosa, Diverticulosis, Dyspnea (02/16/2012), Esophageal dysphagia,  Fatigue, GERD (gastroesophageal reflux disease), Glaucoma, Headache(784.0), Heart attack (HCC), Hiatal hernia, High blood pressure (02/16/2012), Hyperlipidemia, Hypothyroidism, Labile hypertension, Lightheadedness, Migraine headache, Multiple allergies, Myalgia, OSA (obstructive sleep apnea), Pain, lower leg, Problem of menstruation, Proteinuria, Reflux, SOB (shortness of breath), Thyroid disease, Urinary problem, Valvular heart disease, Vitamin B12 deficiency, and Vitamin D deficiency.   Significant Hospital Events: Including procedures, antibiotic start and stop dates in addition to other pertinent events   . 5/25 Admit to internal medicine . 5/26 Cardiac cause of dyspnea unlikely, pulm consult  Interim History / Subjective:   Pt able to stand and move OOB without oxygen desaturation, though remains dyspneic   Objective   Blood pressure (!) 169/61, pulse 70, temperature 97.6 F (36.4 C), temperature source Oral, resp. rate 17, weight 53.9 kg, SpO2 100 %.        Intake/Output Summary (Last 24 hours) at 07/15/2020 1419 Last data filed at 07/15/2020 0931 Gross per 24 hour  Intake 532.62 ml  Output 275 ml  Net 257.62 ml   Filed Weights   07/14/20 1200 07/15/20 0433  Weight: 56.6 kg 53.9 kg    General:  Thin, elderly F sitting up in bed in no distress HEENT: MM pink/moist Neuro: awake, alert, oriented and conversational CV: s1s2 rrr, no m/r/g PULM:  On room air in no distress without tachypnea or accessory muscle, clear upper lobes without wheezing, slight crackles in the RLL GI: soft, mildly distended with mild epigastric pain to palpation, bsx4 active  Extremities: warm/dry, no edema  Skin: no rashes or  lesions  Labs/imaging that I havepersonally reviewed  (right click and "Reselect all SmartList Selections" daily)  CXR CBC BMP  Resolved Hospital Problem list     Assessment & Plan:     Acute on chronic dyspnea likely secondary to deconditioning, chronic sinus drainage  and GERD with acute bronchitis vs PNA Suspect that pt's dyspnea is multi-factorial and a consequence of chronic deconditioning with GERD (doesn't take PPI daily) and history of esophageal stricture with chronic sinus drainage and now likely an acute viral infection along with presenting hypertensive emergency (also reports she does not take her Cozaar daily). Of note, she also mentions abdominal bloating and early satiety and is noted to have a dilated pancreatic duct on last CT abd. She may have an element of COPD, though does not have a history of smoking and felt no improvement with inhalers, would recommend pulmonary f/u for PFT's.   CT chest in 04/2020 with mild scarring and lower lobe bronchiectasis. P: -continue current coverage for CAP -outpatient pulmonology f/u, can follow up with Dr. Judeth Pittman in 4-6 weeks -ICS/LABA of choice at discharge -PT/OT -may benefit from PPI daily -PCCM will sign off, please re-consult for any concerns   Best practice (right click and "Reselect all SmartList Selections" daily)  Per primary  Labs   CBC: Recent Labs  Lab 07/14/20 1246 07/14/20 1254 07/15/20 0413  WBC 10.5  --  9.2  NEUTROABS 7.1  --   --   HGB 13.1 12.9 11.9*  HCT 39.5 38.0 34.5*  MCV 94.7  --  92.5  PLT 256  --  236    Basic Metabolic Panel: Recent Labs  Lab 07/14/20 1246 07/14/20 1254 07/15/20 0413  NA 136 137 137  K 3.7 3.7 4.1  CL 105 106 109  CO2 20*  --  23  GLUCOSE 117* 109* 96  BUN 13 15 10   CREATININE 0.82 0.60 0.69  CALCIUM 9.9  --  9.4   GFR: Estimated Creatinine Clearance: 42.2 mL/min (by C-G formula based on SCr of 0.69 mg/dL). Recent Labs  Lab 07/14/20 1246 07/15/20 0413  WBC 10.5 9.2    Liver Function Tests: Recent Labs  Lab 07/14/20 1246  AST 21  ALT 21  ALKPHOS 58  BILITOT 1.0  PROT 6.7  ALBUMIN 4.0   No results for input(s): LIPASE, AMYLASE in the last 168 hours. No results for input(s): AMMONIA in the last 168 hours.  ABG     Component Value Date/Time   PHART 7.416 07/10/2018 2042   PCO2ART 34.1 07/10/2018 2042   PO2ART 203.0 (H) 07/10/2018 2042   HCO3 22.0 07/10/2018 2042   TCO2 19 (L) 07/14/2020 1254   ACIDBASEDEF 2.0 07/10/2018 2042   O2SAT 100.0 07/10/2018 2042     Coagulation Profile: Recent Labs  Lab 07/14/20 1246  INR 0.9    Cardiac Enzymes: Recent Labs  Lab 07/15/20 1147  CKTOTAL 34*    HbA1C: Hgb A1c MFr Bld  Date/Time Value Ref Range Status  07/10/2018 04:21 AM 5.8 (H) 4.8 - 5.6 % Final    Comment:    (NOTE) Pre diabetes:          5.7%-6.4% Diabetes:              >6.4% Glycemic control for   <7.0% adults with diabetes     CBG: Recent Labs  Lab 07/14/20 1246  GLUCAP 111*    Review of Systems:   Review of Systems  Constitutional: Positive for diaphoresis and malaise/fatigue. Negative  for chills, fever and weight loss.  Respiratory: Positive for cough, sputum production and shortness of breath. Negative for wheezing.   Cardiovascular: Positive for chest pain. Negative for leg swelling and PND.  Gastrointestinal: Positive for abdominal pain, constipation, heartburn and nausea. Negative for diarrhea and vomiting.     Past Medical History:  She,  has a past medical history of Anxiety, Arthritis, Chest pain (2011), Congenital prolapse of bladder mucosa, Diverticulosis, Dyspnea (02/16/2012), Esophageal dysphagia, Fatigue, GERD (gastroesophageal reflux disease), Glaucoma, Headache(784.0), Heart attack (HCC), Hiatal hernia, High blood pressure (02/16/2012), Hyperlipidemia, Hypothyroidism, Labile hypertension, Lightheadedness, Migraine headache, Multiple allergies, Myalgia, OSA (obstructive sleep apnea), Pain, lower leg, Problem of menstruation, Proteinuria, Reflux, SOB (shortness of breath), Thyroid disease, Urinary problem, Valvular heart disease, Vitamin B12 deficiency, and Vitamin D deficiency.   Surgical History:   Past Surgical History:  Procedure Laterality Date  .  ABDOMINAL HYSTERECTOMY  1976  . CHOLECYSTECTOMY  2000  . COLONOSCOPY  07/27/2014   Moderate predominantly sigmoid divertuculosis. Otherwise normal colonoscopy  . CORONARY ARTERY BYPASS GRAFT N/A 07/10/2018   Procedure: CORONARY ARTERY BYPASS GRAFTING (CABG), FREE LIMA;  Surgeon: [name], MD;  Location: [facility] OR;  Service: Open Heart Surgery;  Laterality: N/A;  . CYST REMOVAL NECK    . CYSTECTOMY  1974   Intestine  . CYSTECTOMY  1996   Brain stem  . ESOPHAGOGASTRODUODENOSCOPY  12/07/2016   Schatzki's ring status post esophageal dilatation. Small hiatal hernia. Mild gastritis  . EYE SURGERY  2003  . EYE SURGERY  07/2016  . LEFT HEART CATH AND CORONARY ANGIOGRAPHY N/A 07/08/2018   Procedure: LEFT HEART CATH AND CORONARY ANGIOGRAPHY;  Surgeon: Runell Gess, MD;  Location: [facility] INVASIVE CV LAB;  Service: Cardiovascular;  Laterality: N/A;  . LEFT HEART CATH AND CORONARY ANGIOGRAPHY N/A 07/25/2019   Procedure: LEFT HEART CATH AND CORONARY ANGIOGRAPHY;  Surgeon: Kathleene Hazel, MD;  Location: [facility] INVASIVE CV LAB;  Service: Cardiovascular;  Laterality: N/A;  . MOUTH SURGERY  2013  . RADIOLOGY WITH ANESTHESIA N/A 11/05/2019   Procedure: MRI WITH ANESTHESIA;  Surgeon: Radiologist, Medication, MD;  Location: [facility] OR;  Service: Radiology;  Laterality: N/A;  . TEE WITHOUT CARDIOVERSION N/A 07/10/2018   Procedure: TRANSESOPHAGEAL ECHOCARDIOGRAM (TEE);  Surgeon: [name], MD;  Location: [facility] OR;  Service: Open Heart Surgery;  Laterality: N/A;  . TONSILLECTOMY     age 21     Social History:   reports that she has never smoked. She has never used smokeless tobacco. She reports that she does not drink alcohol and does not use drugs.   Family History:  Her family history includes Asthma in her brother; Cancer in her brother, brother, and sister; Heart attack in her father; Stroke in her father.   Allergies Allergies  Allergen Reactions  . Benadryl [Diphenhydramine] Swelling and Other  (See Comments)    Tongue swells and hallucinates- could not talk  . Lipase Concentrate-Hp [Digestive Enzymes] Rash  . Zenpep [Pancrelipase (Lip-Prot-Amyl)] Rash  . Codeine Nausea And Vomiting and Hypertension  . Meperidine Nausea Only, Swelling and Other (See Comments)    Tongue swells and "I feel like death"  . Other Other (See Comments)    Tears the skin  . Prednisone     Hypertension   . Promethazine Other (See Comments)    Hypotension   . Propranolol Nausea And Vomiting  . Shellfish Allergy Nausea And Vomiting  . Tape Other (See Comments)    Tears the skin  .  Tazarotene Other (See Comments)    Reaction not recalled   . Iodinated Diagnostic Agents Nausea Only and Rash     Home Medications  Prior to Admission medications   Medication Sig Start Date End Date Taking? Authorizing Provider  acetaminophen (TYLENOL) 325 MG tablet Take 975 mg by mouth every 8 (eight) hours as needed for mild pain or headache.  09/29/14  Yes [provider]  aspirin 81 MG tablet Take 81 mg by mouth daily.   Yes [provider]  benzonatate (TESSALON) 100 MG capsule Take 1 capsule (100 mg total) by mouth 2 (two) times daily as needed for cough. 06/30/20  Yes Janie Morning, NP  Calcium Citrate-Vitamin D (CALCIUM CITRATE+D3 PETITES PO) Take 1 tablet by mouth daily.   Yes [provider]  dorzolamide-timolol (COSOPT) 22.3-6.8 MG/ML ophthalmic solution Place 1 drop into the left eye 2 (two) times daily.  06/05/12  Yes [provider]  doxycycline (VIBRAMYCIN) 100 MG capsule Take 1 capsule (100 mg total) by mouth 2 (two) times daily. 07/14/20  Yes Jacalyn Lefevre, MD  levothyroxine (SYNTHROID) 100 MCG tablet Take 1 tablet (100 mcg total) by mouth daily before breakfast. 03/25/20  Yes [name], [name], MD  magnesium oxide (MAG-OX) 400 MG tablet Take 400 mg by mouth daily. Chewable   Yes [provider]  nitroGLYCERIN (NITROSTAT) 0.4 MG SL tablet Place 0.4 mg under the  tongue every 5 (five) minutes as needed for chest pain.  05/10/18  Yes [provider]  ondansetron (ZOFRAN) 8 MG tablet TAKE 1 TABLET (8 MG TOTAL) BY MOUTH 2 (TWO) TIMES DAILY AS NEEDED FOR NAUSEA OR VOMITING. 03/23/20  Yes [name], [name], MD  pantoprazole (PROTONIX) 40 MG tablet Take 1 tablet (40 mg total) by mouth 2 (two) times daily. Patient taking differently: Take 40 mg by mouth daily. 06/14/20  Yes [name], [name], MD  potassium chloride SA (KLOR-CON) 20 MEQ tablet Take 20 mEq by mouth daily.   Yes [provider]  Probiotic Product (PROBIOTIC-10 PO) Take 1 tablet by mouth daily in the afternoon.   Yes [provider]  Sennosides-Docusate Sodium (SENNA PLUS PO) Take 1 tablet by mouth daily as needed (constipation).   Yes [provider]  simvastatin (ZOCOR) 20 MG tablet TAKE 1 TABLET BY MOUTH EVERYDAY AT BEDTIME Patient taking differently: Take 20 mg by mouth at bedtime. 03/23/20  Yes [name], [name], MD  Vitamin D, Cholecalciferol, 50 MCG (2000 UT) CAPS Take 2,000 Units by mouth daily.   Yes [provider]  XELPROS 0.005 % EMUL Place 1 drop into the left eye at bedtime. 05/13/20  Yes [provider]  losartan (COZAAR) 100 MG tablet Take 1 tablet (100 mg total) by mouth daily. Patient not taking: Reported on 07/14/2020 11/08/19   Leatha Gilding, MD     Critical care time: n/a     Darcella Gasman Spirit Wernli, PA-C [facility] See Amion for pager If no response to pager , please call [phone] until 7pm After 7:00 pm call Elink  [phone]

## 2020-07-15 NOTE — Evaluation (Signed)
Occupational Therapy Evaluation Patient Details Name: Leah Pittman MRN: [id number] DOB: 23-Mar-1930 Today's Date: 07/15/2020    History of Present Illness 87 y.o. female who presented with SOB and CP concerning for ACS. PMH is significant for glaucoma, HTN, HLD, multi vessal CAD s/p CABG, hypothyroidism, Vit B12 deficiency, GERD, esophageal dysphagia, anxiety.   Clinical Impression   Pt admitted to the ED for concerns listed above. PTA pt reported that she was independent with all ADL's, husband and daughter assist with IADL's, and she does not use any DME. During the evaluation, pt demonstrated some balance deficits especially with dynamic standing, requiring min guard with all standing activities. Pt would benefit from the use of a RW when up for safety. Additionally due to the pt's visual deficits, all ADL's need to be set up for her, at least in the hospital, due to pt not knowing where anything is. Acute OT will continue following up with pt to address all concerns listed below.     Follow Up Recommendations  Home health OT    Equipment Recommendations  None recommended by OT    Recommendations for Other Services       Precautions / Restrictions Precautions Precautions: Fall Restrictions Weight Bearing Restrictions: No      Mobility Bed Mobility Overal bed mobility: Modified Independent             General bed mobility comments: HOB elevated, pt able to scoot, roll, and get into and out of bed with no difficulties.    Transfers Overall transfer level: Needs assistance Equipment used: None Transfers: Sit to/from Stand Sit to Stand: Supervision         General transfer comment: Supervision for safety    Balance Overall balance assessment: Needs assistance Sitting-balance support: No upper extremity supported;Feet supported Sitting balance-[name]: Good     Standing balance support: No upper extremity supported Standing balance-[name]:  Fair Standing balance comment: Pt demonstrates balance concerns and teetering when static standing and dynamic standing.                           ADL either performed or assessed with clinical judgement   ADL Overall ADL's : Needs assistance/impaired Eating/Feeding: Set up;Sitting Eating/Feeding Details (indicate cue type and reason): Eating breakfast on EOB on entry, OT assisted pt in making a cup of coffee due to vision deficits. Grooming: Wash/dry hands;Wash/dry face;Min guard;Standing Grooming Details (indicate cue type and reason): comleted at sink, min guard for safety due to balance and vision dificits Upper Body Bathing: Set up;Sitting Upper Body Bathing Details (indicate cue type and reason): ROM functional for task, set up due to vision deficits Lower Body Bathing: Min guard;Sitting/lateral leans;Sit to/from stand Lower Body Bathing Details (indicate cue type and reason): Min guard for safety with standing and completing pericare Upper Body Dressing : Set up;Sitting   Lower Body Dressing: Min guard;Sitting/lateral leans;Sit to/from stand Lower Body Dressing Details (indicate cue type and reason): min guard for safety with standing Toilet Transfer: Min guard;Ambulation Toilet Transfer Details (indicate cue type and reason): Min guard due to pt perferring not to use RW due to visual deficits Toileting- Architect and Hygiene: Min guard;Sit to/from stand;Sitting/lateral lean Toileting - Clothing Manipulation Details (indicate cue type and reason): min guard for safety with standing Tub/ Shower Transfer: Min guard;Ambulation Tub/Shower Transfer Details (indicate cue type and reason): min guard for safety due to balance concerns and not using any DME Functional  mobility during ADLs: Min guard General ADL Comments: Pt requesting not to use DME at this time due to not using any at home. Pt's balance is a concern with no support. Due to pt visual deficits all ADL's  require set up and supervision/min guard for safety.     Vision Baseline Vision/History: Glaucoma Patient Visual Report: No change from baseline       Perception Perception Perception Tested?: No   Praxis Praxis Praxis tested?: Not tested    Pertinent Vitals/Pain Pain Assessment: 0-10 Pain Score: 2  Pain Location: chest Pain Descriptors / Indicators: Heaviness Pain Intervention(s): Monitored during session;Limited activity within patient's tolerance;Repositioned     Hand Dominance Right   Extremity/Trunk Assessment Upper Extremity Assessment Upper Extremity Assessment: Overall WFL for tasks assessed   Lower Extremity Assessment Lower Extremity Assessment: Defer to PT evaluation   Cervical / Trunk Assessment Cervical / Trunk Assessment: Normal   Communication Communication Communication: No difficulties   Cognition Arousal/Alertness: Awake/alert Behavior During Therapy: WFL for tasks assessed/performed Overall Cognitive Status: Within Functional Limits for tasks assessed                                     General Comments  VSS on RA    Exercises     Shoulder Instructions      Home Living Family/patient expects to be discharged to:: Private residence Living Arrangements: Spouse/significant other Available Help at Discharge: Family;Available 24 hours/day Type of Home: Apartment (condo) Home Access: Level entry     Home Layout: One level     Bathroom Shower/Tub: Chief Strategy Officer: Handicapped height Bathroom Accessibility: Yes How Accessible: Accessible via walker Home Equipment: Walker - 2 wheels;Bedside commode;Tub bench;Grab bars - toilet;Grab bars - tub/shower          Prior Functioning/Environment Level of Independence: Independent                 OT Problem List: Decreased strength;Decreased activity tolerance;Impaired balance (sitting and/or standing);Decreased coordination;Decreased safety  awareness;Decreased knowledge of use of DME or AE      OT Treatment/Interventions: Self-care/ADL training;Therapeutic exercise;Energy conservation;DME and/or AE instruction;Therapeutic activities;Patient/family education;Balance training    OT Goals(Current goals can be found in the care plan section) Acute Rehab OT Goals Patient Stated Goal: To go home and take a shower OT Goal Formulation: With patient/family Time For Goal Achievement: 07/29/20 Potential to Achieve Goals: Good ADL Goals Pt Will Perform Grooming: with modified independence;with set-up;standing Pt Will Transfer to Toilet: with modified independence;ambulating Pt Will Perform Toileting - Clothing Manipulation and hygiene: with modified independence;sit to/from stand;sitting/lateral leans Additional ADL Goal #1: Pt will verbalize 3 energy conservation techniques that she can use at home.  OT Frequency: Min 2X/week   Barriers to D/C:            Co-evaluation              AM-PAC OT "6 Clicks" Daily Activity     Outcome Measure Help from another person eating meals?: A Little Help from another person taking care of personal grooming?: A Little Help from another person toileting, which includes using toliet, bedpan, or urinal?: A Little Help from another person bathing (including washing, rinsing, drying)?: A Little Help from another person to put on and taking off regular upper body clothing?: A Little Help from another person to put on and taking off regular lower body clothing?: A  Little 6 Click Score: 18   End of Session Equipment Utilized During Treatment: Gait belt Nurse Communication: Mobility status  Activity Tolerance: Patient tolerated treatment well Patient left: in bed;with call bell/phone within reach;with bed alarm set  OT Visit Diagnosis: Unsteadiness on feet (R26.81);Other abnormalities of gait and mobility (R26.89);Muscle weakness (generalized) (M62.81)                Time: 6295-2841 OT Time  Calculation (min): 34 min Charges:  OT General Charges $OT Visit: 1 Visit OT Evaluation $OT Eval Moderate Complexity: 1 Mod OT Treatments $Self Care/Home Management : 8-22 mins  Sylva Overley H., OTR/L Acute Rehabilitation  Daylani Deblois Elane Winton [place] 07/15/2020, 10:41 AM

## 2020-07-15 NOTE — Progress Notes (Signed)
[place] for Breztri. Called patient to let her know to stop by the office to complete form and bring proof of income with her for documentation. Patient stated that she will come by the office this afternoon to sign.    Josiah Lobo, CMA  [phone] Clinical Pharmacist Assistant

## 2020-07-16 ENCOUNTER — Other Ambulatory Visit (HOSPITAL_COMMUNITY): Payer: Self-pay

## 2020-07-16 ENCOUNTER — Inpatient Hospital Stay (HOSPITAL_COMMUNITY): Payer: Medicare Other

## 2020-07-16 DIAGNOSIS — R079 Chest pain, unspecified: Secondary | ICD-10-CM

## 2020-07-16 DIAGNOSIS — Z951 Presence of aortocoronary bypass graft: Secondary | ICD-10-CM

## 2020-07-16 DIAGNOSIS — I10 Essential (primary) hypertension: Secondary | ICD-10-CM

## 2020-07-16 DIAGNOSIS — R0602 Shortness of breath: Secondary | ICD-10-CM | POA: Diagnosis not present

## 2020-07-16 DIAGNOSIS — J449 Chronic obstructive pulmonary disease, unspecified: Secondary | ICD-10-CM | POA: Diagnosis not present

## 2020-07-16 DIAGNOSIS — R072 Precordial pain: Secondary | ICD-10-CM

## 2020-07-16 LAB — CBC
HCT: 36.2 % (ref 36.0–46.0)
Hemoglobin: 12.1 g/dL (ref 12.0–15.0)
MCH: 31.4 pg (ref 26.0–34.0)
MCHC: 33.4 g/dL (ref 30.0–36.0)
MCV: 94 fL (ref 80.0–100.0)
Platelets: 242 10*3/uL (ref 150–400)
RBC: 3.85 MIL/uL — ABNORMAL LOW (ref 3.87–5.11)
RDW: 14.8 % (ref 11.5–15.5)
WBC: 8.5 10*3/uL (ref 4.0–10.5)
nRBC: 0 % (ref 0.0–0.2)

## 2020-07-16 LAB — HEPARIN LEVEL (UNFRACTIONATED): Heparin Unfractionated: <0.1 IU/mL — ABNORMAL LOW (ref 0.30–0.70)

## 2020-07-16 MED ORDER — AEROCHAMBER Z-STAT PLUS/MEDIUM MISC: 2 refills | Status: DC

## 2020-07-16 MED ORDER — LOSARTAN POTASSIUM 25 MG PO TABS: 25.0000 mg | ORAL_TABLET | Freq: Every day | ORAL | 0 refills | Status: DC

## 2020-07-16 MED ORDER — AEROCHAMBER Z-STAT PLUS/MEDIUM MISC
2 refills | Status: DC
  Filled 2020-07-16: qty 1, fill #0

## 2020-07-16 MED ORDER — BUDESONIDE-FORMOTEROL FUMARATE 80-4.5 MCG/ACT IN AERO: 2.0000 | INHALATION_SPRAY | Freq: Two times a day (BID) | RESPIRATORY_TRACT | 12 refills | Status: DC

## 2020-07-16 MED ORDER — ALBUTEROL SULFATE HFA 108 (90 BASE) MCG/ACT IN AERS
2.0000 | INHALATION_SPRAY | RESPIRATORY_TRACT | 0 refills | Status: DC | PRN
Start: 2020-07-16 — End: 2022-04-17

## 2020-07-16 MED ORDER — ONDANSETRON HCL 4 MG PO TABS: 4.0000 mg | ORAL_TABLET | Freq: Two times a day (BID) | ORAL | Status: DC | PRN

## 2020-07-16 MED ORDER — LOSARTAN POTASSIUM 25 MG PO TABS
25.0000 mg | ORAL_TABLET | Freq: Every day | ORAL | 0 refills | Status: DC
  Filled 2020-07-16: qty 30, 30d supply, fill #0

## 2020-07-16 MED ORDER — BUDESONIDE-FORMOTEROL FUMARATE 80-4.5 MCG/ACT IN AERO
2.0000 | INHALATION_SPRAY | Freq: Two times a day (BID) | RESPIRATORY_TRACT | 12 refills | Status: DC
  Filled 2020-07-16: qty 1, fill #0

## 2020-07-16 MED ORDER — ARFORMOTEROL TARTRATE 15 MCG/2ML IN NEBU
15.0000 ug | INHALATION_SOLUTION | Freq: Two times a day (BID) | RESPIRATORY_TRACT | Status: DC
  Filled 2020-07-16: qty 2

## 2020-07-16 MED ORDER — ALBUTEROL SULFATE HFA 108 (90 BASE) MCG/ACT IN AERS
2.0000 | INHALATION_SPRAY | RESPIRATORY_TRACT | 0 refills | Status: DC | PRN
Start: 2020-07-16 — End: 2020-07-16
  Filled 2020-07-16: qty 18, 25d supply, fill #0

## 2020-07-16 NOTE — Care Management Important Message (Signed)
Important Message  Patient Details  Name: KARLITA LICHTMAN MRN: [id number] Date of Birth: 1930-06-26   Medicare Important Message Given:  Yes     [name] 07/16/2020, 1:13 PM

## 2020-07-16 NOTE — Discharge Summary (Signed)
[facility] Discharge Summary  Patient name: Leah Pittman record number: [id number] Date of birth: December 29, 1930 Age: 87 y.o. Gender: female Date of Admission: 07/14/2020  Date of Discharge: 07/16/2020 Admitting Physician: Shary Key, DO  Primary Care Provider: Rochel Brome, MD Consultants: Pulmonology, SLP  Indication for Hospitalization: Dyspnea (initial concern for code stroke and NSTEMI which were ruled out)  Discharge Diagnoses/Problem List:  Active Problems: Dyspnea Deconditioning Dysphagia GERD Generalized weakness  Disposition: Home  Discharge Condition: Stable  Discharge Exam:  Today's Vitals   07/15/20 2150 07/16/20 0448 07/16/20 0500 07/16/20 0810  BP:  (!) 139/55    Pulse:  (!) 58  65  Resp:  17    Temp:  97.8 F (36.6 C)    TempSrc:  Oral    SpO2:  99%    Weight:   53.5 kg   PainSc: Asleep   0-No pain   Body mass index is 20.25 kg/m. Physical Exam: General: Elderly, frail-appearing, dyspneic Cardiovascular: RRR, no murmur Respiratory: Faint crackles bilateral bases, otherwise without wheezing/rhonchi, intermittently with non-productive cough Extremities: No edema, warm and dry  Exam performed by Sharion Settler, DO  Brief Hospital Course:  Leah Pittman is a 87 y.o. female who presented with SOB and CP concerning for ACS. PMH is significant for HTN, HLD, multi vessal CAD s/p CABG, hypothyroidism, Vit B12 deficiency, GERD, esophageal dysphagia, anxiety.   Acute on Chronic Dyspnea  Deconditioning Initially treated as NSTEMI, given ASA, Nitro and started on Heparin gtt. Troponin peaked at 153, EKG without any ST changes. Patient was stable and thus felt less-cardiac in origin given the fact that she is followed closely with cardiologist and appears to have had these chronic ongoing symptoms that have been previously worked up. Did have repeat echo during hospitalization which showed improved EF of 60-65% with  G1DD. Pulmonology consulted and felt this was multifactorial from deconditioning and chronic aspiration/aspiration bronchiectasis. Also potential asthma component so patient was patient started on ICS/LABA nebulizers inpatient. Did not require supplemental oxygen. Ambulated with pulse ox 93%. Discharged with Albuterol PRN and Symbicort to take twice daily. Should follow with pulmonology outpatient.    GERD  Hx Esophageal Stricture  Concern for chronic aspiration Chest x-ray concerning for right lower lobe bronchopneumonia. Patient had recently been treated outpatient with antibiotics. She remained afebrile throughout hospitalization without leukocytosis. Received a dose of Azithromycin and CTX in ED but this was not continued as this appeared to be more chronic. SLP consult was made and after their assessment they recommended modified barium swallow study. That showed no aspiration or laryngeal penetration. Patient was continued on her home Protonix.    Generalized Weakness Initially called in as a code stroke since EMS was concerned about some facial weakness. CT head negative, MRI brain negative and with resolution of symptoms. Neurology was consulted but further workup was deferred as this seemed less likely neurological. Patient has had ongoing generalized weakness. Her B12 and folate were normal. ESR and CK were normal. PT/OT were consulted during hospitalization, unfortunately patient declined HHPT. Referral for HHOT given on d/c.    Follow Up Recommendations:  1. Recommend patient follow up with GI, understand referral has already been placed for this. 2. Needs to follow up with Pulmology. Will need formal PFT's.  3. Would recommend PT for generalized weakness and deconditioning (patient declined HH PT)   Significant Procedures:  Modified Barium Swallow Study  Significant Labs and Imaging:  Recent Labs  Lab 07/14/20  1246 07/14/20 1254 07/15/20 0413 07/16/20 0225  WBC 10.5  --  9.2  8.5  HGB 13.1 12.9 11.9* 12.1  HCT 39.5 38.0 34.5* 36.2  PLT 256  --  236 242   Recent Labs  Lab 07/14/20 1246 07/14/20 1254 07/15/20 0413  NA 136 137 137  K 3.7 3.7 4.1  CL 105 106 109  CO2 20*  --  23  GLUCOSE 117* 109* 96  BUN $Re'13 15 10  'oUA$ CREATININE 0.82 0.60 0.69  CALCIUM 9.9  --  9.4  ALKPHOS 58  --   --   AST 21  --   --   ALT 21  --   --   ALBUMIN 4.0  --   --      Results/Tests Pending at Time of Discharge: None  Discharge Medications:  Allergies as of 07/16/2020      Reactions   Benadryl [diphenhydramine] Swelling, Other (See Comments)   Tongue swells and hallucinates- could not talk   Lipase Concentrate-hp [digestive Enzymes] Rash   Zenpep [pancrelipase (lip-prot-amyl)] Rash   Codeine Nausea And Vomiting, Hypertension   Meperidine Nausea Only, Swelling, Other (See Comments)   Tongue swells and "I feel like death"   Other Other (See Comments)   Tears the skin   Prednisone    Hypertension    Promethazine Other (See Comments)   Hypotension   Propranolol Nausea And Vomiting   Shellfish Allergy Nausea And Vomiting   Tape Other (See Comments)   Tears the skin   Tazarotene Other (See Comments)   Reaction not recalled   Iodinated Diagnostic Agents Nausea Only, Rash      Medication List    STOP taking these medications   ondansetron 8 MG tablet Commonly known as: ZOFRAN     TAKE these medications   acetaminophen 325 MG tablet Commonly known as: TYLENOL Take 975 mg by mouth every 8 (eight) hours as needed for mild pain or headache.   aerochamber Z-Stat Plus/medium inhaler Use as instructed   albuterol 108 (90 Base) MCG/ACT inhaler Commonly known as: VENTOLIN HFA Inhale 2 puffs into the lungs every 4 (four) hours as needed for wheezing or shortness of breath.   aspirin 81 MG tablet Take 81 mg by mouth daily.   benzonatate 100 MG capsule Commonly known as: TESSALON Take 1 capsule (100 mg total) by mouth 2 (two) times daily as needed for cough.    budesonide-formoterol 80-4.5 MCG/ACT inhaler Commonly known as: Symbicort Inhale 2 puffs into the lungs in the morning and at bedtime.   CALCIUM CITRATE+D3 PETITES PO Take 1 tablet by mouth daily.   dorzolamide-timolol 22.3-6.8 MG/ML ophthalmic solution Commonly known as: COSOPT Place 1 drop into the left eye 2 (two) times daily.   levothyroxine 100 MCG tablet Commonly known as: SYNTHROID Take 1 tablet (100 mcg total) by mouth daily before breakfast.   losartan 25 MG tablet Commonly known as: COZAAR Take 1 tablet (25 mg total) by mouth daily. Start taking on: Jul 17, 2020 What changed:   medication strength  how much to take   magnesium oxide 400 MG tablet Commonly known as: MAG-OX Take 400 mg by mouth daily. Chewable   nitroGLYCERIN 0.4 MG SL tablet Commonly known as: NITROSTAT Place 0.4 mg under the tongue every 5 (five) minutes as needed for chest pain.   pantoprazole 40 MG tablet Commonly known as: PROTONIX Take 1 tablet (40 mg total) by mouth 2 (two) times daily. What changed: when to take this  potassium chloride SA 20 MEQ tablet Commonly known as: KLOR-CON Take 20 mEq by mouth daily.   PROBIOTIC-10 PO Take 1 tablet by mouth daily in the afternoon.   SENNA PLUS PO Take 1 tablet by mouth daily as needed (constipation).   simvastatin 20 MG tablet Commonly known as: ZOCOR TAKE 1 TABLET BY MOUTH EVERYDAY AT BEDTIME What changed: See the new instructions.   vitamin D3 50 MCG (2000 UT) Caps Take 2,000 Units by mouth daily.   Xelpros 0.005 % Emul Generic drug: Latanoprost Place 1 drop into the left eye at bedtime.       Discharge Instructions: Please refer to Patient Instructions section of EMR for full details.  Patient was counseled important signs and symptoms that should prompt return to medical care, changes in medications, dietary instructions, activity restrictions, and follow up appointments.   Follow-Up Appointments:  Follow-up Information     [facility]. Go on 08/13/2021.   Specialty: Pulmonology Why: You have a follow up appointment with Dr. Silas Flood on 6/24 at 2:00pm Contact information: [address] [place] [address] [phone]       Rochel Brome, MD. Schedule an appointment as soon as possible for a visit in 1 week(s).   Specialties: Internal Medicine, Interventional Cardiology, Radiology, Anesthesiology Contact information: [address] [place] [address] [phone]        Sanda Klein, MD .   Specialty: Cardiology Contact information: [address] [place] [address] [phone]               Carollee Leitz, MD 07/16/2020, 3:18 PM PGY-2, [place]

## 2020-07-16 NOTE — Evaluation (Signed)
Clinical/Bedside Swallow Evaluation Patient Details  Name: Leah Pittman MRN: [id number] Date of Birth: [birth year]  Today's Date: 07/16/2020 Time: SLP Start Time (ACUTE ONLY): 1125 SLP Stop Time (ACUTE ONLY): 1145 SLP Time Calculation (min) (ACUTE ONLY): 20 min  Past Medical History:  Past Medical History:  Diagnosis Date  . Anxiety   . Arthritis   . Chest pain 2011   CARDIOLITE - no significant symptoms, EKG changes, or arrhythmias  . Congenital prolapse of bladder mucosa   . Diverticulosis   . Dyspnea 02/16/2012   2D ECHO - EF 55-60%, normal  . Esophageal dysphagia   . Fatigue   . GERD (gastroesophageal reflux disease)   . Glaucoma   . Headache(784.0)   . Heart attack (HCC)   . Hiatal hernia   . High blood pressure 02/16/2012   RENAL DOPPLER - normal  . Hyperlipidemia   . Hypothyroidism   . Labile hypertension   . Lightheadedness   . Migraine headache   . Multiple allergies   . Myalgia   . OSA (obstructive sleep apnea)   . Pain, lower leg    Calf  . Problem of menstruation   . Proteinuria   . Reflux   . SOB (shortness of breath)   . Thyroid disease   . Urinary problem   . Valvular heart disease   . Vitamin B12 deficiency   . Vitamin D deficiency    Past Surgical History:  Past Surgical History:  Procedure Laterality Date  . ABDOMINAL HYSTERECTOMY  1976  . CHOLECYSTECTOMY  2000  . COLONOSCOPY  07/27/2014   Moderate predominantly sigmoid divertuculosis. Otherwise normal colonoscopy  . CORONARY ARTERY BYPASS GRAFT N/A 07/10/2018   Procedure: CORONARY ARTERY BYPASS GRAFTING (CABG), FREE LIMA;  Surgeon: [name], MD;  Location: [facility] OR;  Service: Open Heart Surgery;  Laterality: N/A;  . CYST REMOVAL NECK    . CYSTECTOMY  1974   Intestine  . CYSTECTOMY  1996   Brain stem  . ESOPHAGOGASTRODUODENOSCOPY  12/07/2016   Schatzki's ring status post esophageal dilatation. Small hiatal hernia. Mild gastritis  . EYE SURGERY  2003  . EYE SURGERY  07/2016  .  LEFT HEART CATH AND CORONARY ANGIOGRAPHY N/A 07/08/2018   Procedure: LEFT HEART CATH AND CORONARY ANGIOGRAPHY;  Surgeon: Runell Gess, MD;  Location: [facility] INVASIVE CV LAB;  Service: Cardiovascular;  Laterality: N/A;  . LEFT HEART CATH AND CORONARY ANGIOGRAPHY N/A 07/25/2019   Procedure: LEFT HEART CATH AND CORONARY ANGIOGRAPHY;  Surgeon: Kathleene Hazel, MD;  Location: [facility] INVASIVE CV LAB;  Service: Cardiovascular;  Laterality: N/A;  . MOUTH SURGERY  2013  . RADIOLOGY WITH ANESTHESIA N/A 11/05/2019   Procedure: MRI WITH ANESTHESIA;  Surgeon: Radiologist, Medication, MD;  Location: [facility] OR;  Service: Radiology;  Laterality: N/A;  . TEE WITHOUT CARDIOVERSION N/A 07/10/2018   Procedure: TRANSESOPHAGEAL ECHOCARDIOGRAM (TEE);  Surgeon: [name], MD;  Location: [facility] OR;  Service: Open Heart Surgery;  Laterality: N/A;  . TONSILLECTOMY     age 34   HPI:  87 y.o. female who presented with SOB and CP concerning for ACS. PMH is significant for HTN, HLD, multi vessel CAD s/p CABG, hypothyroidism, Vit B12 deficiency, GERD, esophageal dysphagia, anxiety.Pulmonology suggests possible chronic aspiration.  CT cervical spine shows multilevel disc degeneration. Most notably, moderate disc degeneration is present at C6-C7. Barium swallow 02/04/20: Mild esophageal dysmotility is noted with break up of primary peristalsis in the mid to lower thoracic esophagus.   Assessment /  Plan / Recommendation Clinical Impression  Pt participated in clinical swallow assessment.  Oral mechanism exam was normal.  She presented with intermittent coughing throughout assessment; it was difficult to attribute coughing to aspiration.  She described globus, vocal changes/hoarseness. Given bronchopneumonia RLL and hx of dysphagia, she would benefit from [facility] prior to D/C. Scheduled for 1400 today. SLP Visit Diagnosis: Dysphagia, unspecified (R13.10)    Aspiration Risk    TBA   Diet Recommendation   continue current diet        Other  Recommendations     Follow up Recommendations   MBS today       Swallow Study   General Date of Onset: 07/16/20 HPI: 87 y.o. female who presented with SOB and CP concerning for ACS. PMH is significant for HTN, HLD, multi vessel CAD s/p CABG, hypothyroidism, Vit B12 deficiency, GERD, esophageal dysphagia, anxiety.Pulmonology suggests possible chronic aspiration.  CT cervical spine shows multilevel disc degeneration. Most notably, moderate disc degeneration is present at C6-C7. Barium swallow 02/04/20: Mild esophageal dysmotility is noted with break up of primary peristalsis in the mid to lower thoracic esophagus. Type of Study: Bedside Swallow Evaluation Previous Swallow Assessment: no Diet Prior to this Study: Regular;Thin liquids Temperature Spikes Noted: No Respiratory Status: Room air History of Recent Intubation: No Behavior/Cognition: Alert Oral Cavity Assessment: Within Functional Limits Oral Care Completed by SLP: No Oral Cavity - Dentition: Adequate natural dentition Vision: Functional for self-feeding Self-Feeding Abilities: Able to feed self Patient Positioning: Upright in chair Baseline Vocal Quality: Hoarse Volitional Cough: Strong Volitional Swallow: Able to elicit    Oral/Motor/Sensory Function Overall Oral Motor/Sensory Function: Within functional limits   Ice Chips Ice chips: Within functional limits   Thin Liquid Thin Liquid: Within functional limits    Nectar Thick Nectar Thick Liquid: Not tested   Honey Thick Honey Thick Liquid: Not tested   Puree Puree: Within functional limits   Solid     Solid: Within functional limits      Blenda Mounts Laurice 07/16/2020,11:50 AM  Marchelle Folks L. Samson Frederic, MA CCC/SLP Acute Rehabilitation Services Office number [phone] Pager [phone]

## 2020-07-16 NOTE — Progress Notes (Signed)
Modified Barium Swallow Progress Note  Patient Details  Name: Leah Pittman MRN: [id number] Date of Birth: November 17, 1930  Today's Date: 07/16/2020  Modified Barium Swallow completed.  Full report located under Chart Review in the Imaging Section.  Brief recommendations include the following:  Clinical Impression  Pt presents with functional oropharyngeal swallow with NO observed laryngeal penetration nor aspiration, despite coughing occasionally throughout study.  Oral phase was normal. There was mild, trace residue in the pharynx post-swallow, attributable to potential weakness but not concerning. Occasional scanning of the esophagus revealed some retention of solid barium, cleared with liquid wash and not inconsistent with findings from UGI in December of 2021.  Dysphagia-related aspiration is unlikely.  There is potential for nocturnal aspiration of esophageal stasis vs reflux.  After study, we discussed results and pt was provided with a handout that addressed esophageal precautions (elevating HOB, alternating sips of liquids with bites of solid foods, avoiding eating/drinking 2 hours before bedtime). Pt verbalized understanding.  No diet modifications need to be made; continue regular solids/thin liquids with above precautions.   Swallow Evaluation Recommendations       SLP Diet Recommendations: Regular solids;Thin liquid   Liquid Administration via: Cup;Straw   Medication Administration: Whole meds with liquid   Supervision: Patient able to self feed   Compensations: Follow solids with liquid   Postural Changes: Remain semi-upright after after feeds/meals (Comment)   Oral Care Recommendations: Oral care BID       Dashanique Brownstein L. Samson Frederic, MA CCC/SLP Acute Rehabilitation Services Office number [phone] Pager [phone]  Blenda Mounts Laurice 07/16/2020,2:44 PM

## 2020-07-16 NOTE — Progress Notes (Signed)
Spoke with patient and husband, discussed results of swallow study.  All questions answered.  Patient reports feeling a little better and is on her way to pharmacy to pick up new prescriptions.  Reminded patient of outpatient pulmonology follow up and strict return precautions provided.  Dana Allan, MD Family Medicine Residency

## 2020-07-16 NOTE — Discharge Instructions (Signed)
Dear Leah Pittman,  Thank you for letting us participate in your care. You were hospitalized for difficulty breathing.  Thankfully we are able to do some studies and do not think that you had a heart attack or stroke.  I believe that your difficulty breathing is likely related to aging but may also be attributed to some micro aspirations and/or asthma.  You were treated with nebulizer treatments in the hospital.  We have discharged you on albuterol to take as needed when you are wheezing or feels short of breath.  He will also have a new medication called Symbicort, which you should take twice daily.  Please follow-up with the pulmonologist outpatient for further studies to determine your lung function.  You also should follow-up with your primary care doctor for hospital follow-up within the next week.  Additionally, please see your gastroenterologist (whom you have been referred to by your PCP) for your issues with swallowing.  POST-HOSPITAL & CARE INSTRUCTIONS 1. Please call to schedule an appointment to see your primary care physician in the next week. 2. Follow-up with pulmonology outpatient. You have a follow up appointment with Dr. Judeth Horn on 6/24 at 2:00pm 3. Follow-up with your gastroenterologist. 4. Take Symbicort twice daily 5. Take 25 mg of losartan until told otherwise by your cardiologist or primary care physician.  We are changing this medication because we do not want your blood pressures to be low as you are at risk for falling.  They can check your blood pressures in the office and adjust your medications as needed. 6. Take albuterol as a rescue medication only when needed, when you are feeling short of breath or wheezing 7. Go to your follow up appointments (listed below)   DOCTOR'S APPOINTMENT   Future Appointments  Date Time Provider Department Center  08/13/2020  2:00 PM Hunsucker, Lesia Sago, MD [facility] None  09/08/2020  9:30 AM Lucie Leather, Alvira Philips, MD [facility] None   09/21/2020 10:00 AM Blane Ohara, MD [name] None  10/13/2020  2:00 PM [name] PHARMACIST [name] None    Follow-up Information    [facility]. Go on 07/13/2021.   Specialty: Pulmonology Why: You have a follow up appointment with Dr. Judeth Horn on 6/24 at 2:00pm Contact information: [address] [place] [address] [phone]              Take care and be well!  Family Medicine Teaching Service Inpatient Team [place]  [facility]  [address] [place], [place] [address] [phone]

## 2020-07-16 NOTE — Progress Notes (Signed)
Physical Therapy Treatment Patient Details Name: Leah Pittman MRN: [id number] DOB: 09-14-1930 Today's Date: 07/16/2020    History of Present Illness Pt is an 87 y.o. female admitted 07/14/20 with SOB and chest pain. CXR concerning for bronchopneumonia in RLL. Workup for acute on chronic dyspnea likely secondary to deconditioning, chronic sinus drainage and GERD with acute bronchitis vs PNA. PMH includes glaucoma (legally blind), HTN, HLD, CAD s/p CABG, dysphagia, anxiety.   PT Comments    Pt progressing with mobility. Today's session focused on gait training, pt demonstrates improved activity tolerance. Stability significantly improved with use of RW. Pt and pt's husband educ on recommendation for (at least) initial use of RW upon return home; pt in agreement. Pt preparing for d/c home today. If to remain admitted, will continue to follow acutely.  SATURATION QUALIFICATIONS: (This note is used to comply with regulatory documentation for home oxygen)  Patient Saturations on Room Air at Rest = 97%  Patient Saturations on Room Air while Ambulating = 93%    Follow Up Recommendations  No PT follow up;Supervision for mobility/OOB (declined HHPT)     Equipment Recommendations  None recommended by PT    Recommendations for Other Services       Precautions / Restrictions Precautions Precautions: Fall;Other (comment) Precaution Comments: H/o glaucoma with low vision Restrictions Weight Bearing Restrictions: No    Mobility  Bed Mobility Overal bed mobility: Independent                  Transfers Overall transfer level: Needs assistance Equipment used: None;Rolling walker (2 wheeled) Transfers: Sit to/from Stand Sit to Stand: Supervision            Ambulation/Gait Ambulation/Gait assistance: Supervision;Min guard Gait Distance (Feet): 480 Feet Assistive device: Rolling walker (2 wheeled);None Gait Pattern/deviations: Step-through pattern;Decreased stride length      General Gait Details: Slow, mostly steady gait with RW and supervision for balance, 1x self-corrected instability with turning. Upon return to room, pt trialled walking ~20' without DME, close min guard for balance due to instability, pt reports feeling unstable and need to stick with using RW for now   Stairs             Wheelchair Mobility    Modified Rankin (Stroke Patients Only)       Balance Overall balance assessment: Needs assistance Sitting-balance support: No upper extremity supported;Feet supported Sitting balance-[name]: Good     Standing balance support: No upper extremity supported;During functional activity   Standing balance comment: Can static stand and ambulate without DME, unstable                            Cognition Arousal/Alertness: Awake/alert Behavior During Therapy: WFL for tasks assessed/performed Overall Cognitive Status: Within Functional Limits for tasks assessed                                 General Comments: WFL for simple tasks; suspect some decreased attention and short-term memory deficits at baseline      Exercises      General Comments General comments (skin integrity, edema, etc.): SpO2 >/93% on RA with activity. Husband present at beginning of session - educ pt and husband on recommendation for RW use on (at least) initial return home for added stability and fall risk reduction      Pertinent Vitals/Pain Pain Assessment: No/denies pain Pain  Intervention(s): Monitored during session    Home Living                      Prior Function            PT Goals (current goals can now be found in the care plan section) Progress towards PT goals: Progressing toward goals    Frequency    Min 3X/week      PT Plan Current plan remains appropriate    Co-evaluation              AM-PAC PT "6 Clicks" Mobility   Outcome Measure  Help needed turning from your back to your side  while in a flat bed without using bedrails?: None Help needed moving from lying on your back to sitting on the side of a flat bed without using bedrails?: None Help needed moving to and from a bed to a chair (including a wheelchair)?: A Little Help needed standing up from a chair using your arms (e.g., wheelchair or bedside chair)?: A Little Help needed to walk in hospital room?: A Little Help needed climbing 3-5 steps with a railing? : A Little 6 Click Score: 20    End of Session Equipment Utilized During Treatment: Gait belt Activity Tolerance: Patient tolerated treatment well Patient left: in chair;with call bell/phone within reach;Other (comment) (with SLP present) Nurse Communication: Mobility status PT Visit Diagnosis: Other abnormalities of gait and mobility (R26.89);Muscle weakness (generalized) (M62.81)     Time: 4720-7218 PT Time Calculation (min) (ACUTE ONLY): 18 min  Charges:  $Gait Training: 8-22 mins                     Ina Homes, PT, DPT Acute Rehabilitation Services  Pager [phone] Office [phone]  Malachy Chamber 07/16/2020, 1:43 PM

## 2020-07-18 LAB — METHYLMALONIC ACID, SERUM: Methylmalonic Acid, Quantitative: 141 nmol/L (ref 0–378)

## 2020-07-21 ENCOUNTER — Telehealth: Payer: Self-pay

## 2020-07-21 NOTE — Progress Notes (Addendum)
Chronic Care Management Pharmacy Assistant   Name: Leah Pittman  MRN: [id number] DOB: 06-02-1930  Leah Pittman is an 87 y.o. year old female who presents for his follow-up CCM visit with the clinical pharmacist.  Reason for Encounter: Post-hospital call    Conditions to be addressed/monitored: HTN, COPD,. Pneumonia, post-hospital    Recent office visits:  07/14/2020: Leah Mandes [name],MD (PCP) for shortness of breath/ discontinued Amoxicillin and Prednisone  06/30/2020: Leah Hailstone, NP (PCP) / add Benzonatate 100 mg tablet twice daily  06/29/2020: Leah Hailstone, NP (PCP) / add Amoxicillin-Pot Clavulanate 875-125 mg. Tablet: take 1 tablet twice daily, Prednisone 10 mg daily with breakfast / COVID test administered, 2-view chest x-ray obtained, injection Ceftriaxone 1g./ triamcinolone 60 mg.administered.   Recent consult visits:  No consult visits since last CCM visit   Hospital visits:  07/14/2020: ED to admission: Discharge  Medication list at discharge 07/16/2020.  Patient to follow up with Dr. Judeth Horn (Pulmonology) 08/13/2020 @ 2PM / patient instructed to follow-up for repeat chest X-rays @ [facility] radiology department./ instructed to schedule appointment With Dr. Sedalia Muta week of 07/24/2020. Patient also instructed to follow-up with  Dr. Royann Shivers (Cardiology).  No flowsheet data found   Medications: Outpatient Encounter Medications as of 07/21/2020  Medication Sig   acetaminophen (TYLENOL) 325 MG tablet Take 975 mg by mouth every 8 (eight) hours as needed for mild pain or headache.    albuterol (VENTOLIN HFA) 108 (90 Base) MCG/ACT inhaler Inhale 2 puffs into the lungs every 4 (four) hours as needed for wheezing or shortness of breath.   aspirin 81 MG tablet Take 81 mg by mouth daily.   benzonatate (TESSALON) 100 MG capsule Take 1 capsule (100 mg total) by mouth 2 (two) times daily as needed for cough.   budesonide-formoterol (SYMBICORT) 80-4.5 MCG/ACT inhaler Inhale 2 puffs  into the lungs in the morning and at bedtime.   Calcium Citrate-Vitamin D (CALCIUM CITRATE+D3 PETITES PO) Take 1 tablet by mouth daily.   dorzolamide-timolol (COSOPT) 22.3-6.8 MG/ML ophthalmic solution Place 1 drop into the left eye 2 (two) times daily.    levothyroxine (SYNTHROID) 100 MCG tablet Take 1 tablet (100 mcg total) by mouth daily before breakfast.   losartan (COZAAR) 25 MG tablet Take 1 tablet (25 mg total) by mouth daily.   magnesium oxide (MAG-OX) 400 MG tablet Take 400 mg by mouth daily. Chewable   nitroGLYCERIN (NITROSTAT) 0.4 MG SL tablet Place 0.4 mg under the tongue every 5 (five) minutes as needed for chest pain.    pantoprazole (PROTONIX) 40 MG tablet Take 1 tablet (40 mg total) by mouth 2 (two) times daily. (Patient taking differently: Take 40 mg by mouth daily.)   potassium chloride SA (KLOR-CON) 20 MEQ tablet Take 20 mEq by mouth daily.   Probiotic Product (PROBIOTIC-10 PO) Take 1 tablet by mouth daily in the afternoon.   Sennosides-Docusate Sodium (SENNA PLUS PO) Take 1 tablet by mouth daily as needed (constipation).   simvastatin (ZOCOR) 20 MG tablet TAKE 1 TABLET BY MOUTH EVERYDAY AT BEDTIME (Patient taking differently: Take 20 mg by mouth at bedtime.)   Spacer/Aero-Holding Chambers (AEROCHAMBER Z-STAT PLUS/MEDIUM) inhaler Use as instructed   Vitamin D, Cholecalciferol, 50 MCG (2000 UT) CAPS Take 2,000 Units by mouth daily.   XELPROS 0.005 % EMUL Place 1 drop into the left eye at bedtime.   No facility-administered encounter medications on file as of 07/21/2020.    Recent Office Vitals: BP Readings from Last 3 Encounters:  07/16/20 Marland Kitchen)  139/55  07/14/20 130/78  06/30/20 (!) 142/62   Pulse Readings from Last 3 Encounters:  07/16/20 65  07/14/20 71  06/30/20 66    Wt Readings from Last 3 Encounters:  07/16/20 118 lb (53.5 kg)  07/14/20 125 lb (56.7 kg)  06/30/20 122 lb (55.3 kg)     Kidney Function Lab Results  Component Value Date/Time   CREATININE 0.69  07/15/2020 04:13 AM   CREATININE 0.60 07/14/2020 12:54 PM   GFR 60.94 01/27/2020 04:11 PM   GFRNONAA >60 07/15/2020 04:13 AM   GFRAA 64 12/22/2019 11:40 AM    BMP Latest Ref Rng & Units 07/15/2020 07/14/2020 07/14/2020  Glucose 70 - 99 mg/dL 96 993(T) 701(X)  BUN 8 - 23 mg/dL 10 15 13   Creatinine 0.44 - 1.00 mg/dL 7.93 9.03  BUN/Creat Ratio 12 - 28 - - -  Sodium 135 - 145 mmol/L 137 137 136  Potassium 3.5 - 5.1 mmol/L 4.1 3.7 3.7  Chloride 98 - 111 mmol/L 109 106 105  CO2 22 - 32 mmol/L 23 - 20(L)  Calcium 8.9 - 10.3 mg/dL 9.4 - 9.9     Current antihypertensive regimen:  Losartan 25 mg. Take 1 tablet daily   Patient verbally confirms she is taking the above medications as directed.    How often are you checking your Blood Pressure? Patient stated she checks her BP daily, sometimes several times a day. This morning she checked it before she took her medication and it was 126/45 and her pulse was 68. She stated that she had a reading of 98/42 yesterday and was concerned about this reading.   . Any readings above 180/120? No  If yes any symptoms of hypertensive emergency? No  Adherence Review: Is the patient currently on ACE/ARB medication? yes   Star Rating Drugs:  Medication:  Last Fill: Day Supply Simvastatin  03/23/2020 90DS Losartan  07/16/2020 90DS    COPD symptoms, including some shortness of breath, but much better than when she went into the hospital. She reported that she is feeling stronger and "less sick overall" and her "legs don't feel as weak" as last week. She stated that she is "slowly getting better. She stated that she has appointment with Dr. 07/18/2020 tomorrow and that she will also sign patient assistance form while at the office.     Adherence Review: Does the patient have >5 day gap between last estimated fill date for maintenance inhaler medications? Yes,  CPP to review   Sedalia Muta, CMA  [phone] Clinical Pharmacist Assistant

## 2020-07-22 ENCOUNTER — Ambulatory Visit (INDEPENDENT_AMBULATORY_CARE_PROVIDER_SITE_OTHER): Payer: Medicare Other | Admitting: Family Medicine

## 2020-07-22 ENCOUNTER — Encounter: Payer: Self-pay | Admitting: Family Medicine

## 2020-07-22 ENCOUNTER — Telehealth: Payer: Self-pay

## 2020-07-22 ENCOUNTER — Other Ambulatory Visit: Payer: Self-pay

## 2020-07-22 VITALS — BP 144/64 | HR 70 | Temp 97.5°F | Resp 16 | Ht 64.0 in | Wt 122.4 lb

## 2020-07-22 DIAGNOSIS — R1319 Other dysphagia: Secondary | ICD-10-CM

## 2020-07-22 DIAGNOSIS — R0602 Shortness of breath: Secondary | ICD-10-CM

## 2020-07-22 DIAGNOSIS — R531 Weakness: Secondary | ICD-10-CM | POA: Diagnosis not present

## 2020-07-22 DIAGNOSIS — R5381 Other malaise: Secondary | ICD-10-CM | POA: Diagnosis not present

## 2020-07-22 NOTE — Progress Notes (Addendum)
    Chronic Care Management Pharmacy Assistant   Name: Leah Pittman  MRN: [id number] DOB: July 21, 1930    Reason for Encounter: Patient call for PAP   Called patient to discuss completion of her pap form that she signed this morning. She had questions regarding the patient assistance process and some of the questions that were asked on the form. She will gather the rest of documentation needed and call Dr. Renea Ee office with the information that is needed.      Medications: Outpatient Encounter Medications as of 07/22/2020  Medication Sig   acetaminophen (TYLENOL) 325 MG tablet Take 975 mg by mouth every 8 (eight) hours as needed for mild pain or headache.    albuterol (VENTOLIN HFA) 108 (90 Base) MCG/ACT inhaler Inhale 2 puffs into the lungs every 4 (four) hours as needed for wheezing or shortness of breath.   aspirin 81 MG tablet Take 81 mg by mouth daily.   benzonatate (TESSALON) 100 MG capsule Take 1 capsule (100 mg total) by mouth 2 (two) times daily as needed for cough.   budesonide-formoterol (SYMBICORT) 80-4.5 MCG/ACT inhaler Inhale 2 puffs into the lungs in the morning and at bedtime.   Calcium Citrate-Vitamin D (CALCIUM CITRATE+D3 PETITES PO) Take 1 tablet by mouth daily.   dorzolamide-timolol (COSOPT) 22.3-6.8 MG/ML ophthalmic solution Place 1 drop into the left eye 2 (two) times daily.    levothyroxine (SYNTHROID) 100 MCG tablet Take 1 tablet (100 mcg total) by mouth daily before breakfast.   losartan (COZAAR) 25 MG tablet Take 1 tablet (25 mg total) by mouth daily.   magnesium oxide (MAG-OX) 400 MG tablet Take 400 mg by mouth daily. Chewable   nitroGLYCERIN (NITROSTAT) 0.4 MG SL tablet Place 0.4 mg under the tongue every 5 (five) minutes as needed for chest pain.    pantoprazole (PROTONIX) 40 MG tablet Take 1 tablet (40 mg total) by mouth 2 (two) times daily. (Patient taking differently: Take 40 mg by mouth daily.)   potassium chloride SA (KLOR-CON) 20 MEQ tablet Take 20 mEq by  mouth daily.   Probiotic Product (PROBIOTIC-10 PO) Take 1 tablet by mouth daily in the afternoon.   Sennosides-Docusate Sodium (SENNA PLUS PO) Take 1 tablet by mouth daily as needed (constipation).   simvastatin (ZOCOR) 20 MG tablet TAKE 1 TABLET BY MOUTH EVERYDAY AT BEDTIME (Patient taking differently: Take 20 mg by mouth at bedtime.)   Spacer/Aero-Holding Chambers (AEROCHAMBER Z-STAT PLUS/MEDIUM) inhaler Use as instructed   Vitamin D, Cholecalciferol, 50 MCG (2000 UT) CAPS Take 2,000 Units by mouth daily.   XELPROS 0.005 % EMUL Place 1 drop into the left eye at bedtime.   No facility-administered encounter medications on file as of 07/22/2020.   Josiah Lobo, CMA  [phone] Clinical Pharmacist Assistant

## 2020-07-22 NOTE — Progress Notes (Signed)
Subjective:  Patient ID: Leah Pittman, female    DOB: 15-Feb-1931  Age: 87 y.o. MRN: [id number]  Chief Complaint  Patient presents with   Hospitalization Follow-up    HPI Patient presents for hospital follow up. He was admitted on 07/14/2020 and discharged on 07/16/2020. Pt was sent by ems from our office when she developed respiratory distress and a left facial droop. She was taken as a code stroke. She was treated as a NSTEMI. Given aspirin, ntg, and heparin drip. Troponin peaked at 153 and EKG wihtou any st changes. Echo showed improved EF 60-65%. Pulmonology was consulted and felt her dyspnea is multifactorial from deconditioning and chronic aspiration bronchiectasis. Started on ICS/LABA nebulizers. No oxygen was required. She was discharged on albuterol prn and symbicort 2 puffs twice a day.  CXR RLL bronchopneumonia. Given zithromax and CTX in ED. This was not continued because it was felt to be more of a chronic issue. Modifed Barium swallow study showed no aspiration or laryngeal penetration. Pt was continued on pantoprazole.  Code stroke: MRI brain negative. CT of brain negative. B12, folate, esr, cpk were normal.  Pt was recommended to follow up with GI, pulmonology. She is feeling better.    Current Outpatient Medications on File Prior to Visit  Medication Sig Dispense Refill   acetaminophen (TYLENOL) 325 MG tablet Take 975 mg by mouth every 8 (eight) hours as needed for mild pain or headache.      albuterol (VENTOLIN HFA) 108 (90 Base) MCG/ACT inhaler Inhale 2 puffs into the lungs every 4 (four) hours as needed for wheezing or shortness of breath. 18 g 0   aspirin 81 MG tablet Take 81 mg by mouth daily.     benzonatate (TESSALON) 100 MG capsule Take 1 capsule (100 mg total) by mouth 2 (two) times daily as needed for cough. 20 capsule 0   budesonide-formoterol (SYMBICORT) 80-4.5 MCG/ACT inhaler Inhale 2 puffs into the lungs in the morning and at bedtime. 1 each 12   Calcium  Citrate-Vitamin D (CALCIUM CITRATE+D3 PETITES PO) Take 1 tablet by mouth daily.     dorzolamide-timolol (COSOPT) 22.3-6.8 MG/ML ophthalmic solution Place 1 drop into the left eye 2 (two) times daily.      levothyroxine (SYNTHROID) 100 MCG tablet Take 1 tablet (100 mcg total) by mouth daily before breakfast. 90 tablet 1   losartan (COZAAR) 25 MG tablet Take 1 tablet (25 mg total) by mouth daily. 30 tablet 0   magnesium oxide (MAG-OX) 400 MG tablet Take 400 mg by mouth daily. Chewable     nitroGLYCERIN (NITROSTAT) 0.4 MG SL tablet Place 0.4 mg under the tongue every 5 (five) minutes as needed for chest pain.      pantoprazole (PROTONIX) 40 MG tablet Take 1 tablet (40 mg total) by mouth 2 (two) times daily. (Patient taking differently: Take 40 mg by mouth daily.) 180 tablet 1   potassium chloride SA (KLOR-CON) 20 MEQ tablet Take 20 mEq by mouth daily.     Probiotic Product (PROBIOTIC-10 PO) Take 1 tablet by mouth daily in the afternoon.     Sennosides-Docusate Sodium (SENNA PLUS PO) Take 1 tablet by mouth daily as needed (constipation).     simvastatin (ZOCOR) 20 MG tablet TAKE 1 TABLET BY MOUTH EVERYDAY AT BEDTIME (Patient taking differently: Take 20 mg by mouth at bedtime.) 90 tablet 0   Spacer/Aero-Holding Chambers (AEROCHAMBER Z-STAT PLUS/MEDIUM) inhaler Use as instructed 1 each 2   Vitamin D, Cholecalciferol, 50 MCG (2000 UT)  CAPS Take 2,000 Units by mouth daily.     XELPROS 0.005 % EMUL Place 1 drop into the left eye at bedtime.     No current facility-administered medications on file prior to visit.   Past Medical History:  Diagnosis Date   Anxiety    Arthritis    Chest pain 2011   CARDIOLITE - no significant symptoms, EKG changes, or arrhythmias   Congenital prolapse of bladder mucosa    Diverticulosis    Dyspnea 02/16/2012   2D ECHO - EF 55-60%, normal   Esophageal dysphagia    Fatigue    GERD (gastroesophageal reflux disease)    Glaucoma    Headache(784.0)    Heart attack ([place])     Hiatal hernia    High blood pressure 02/16/2012   RENAL DOPPLER - normal   Hyperlipidemia    Hypothyroidism    Labile hypertension    Lightheadedness    Migraine headache    Multiple allergies    Myalgia    OSA (obstructive sleep apnea)    Pain, lower leg    Calf   Problem of menstruation    Proteinuria    Reflux    SOB (shortness of breath)    Thyroid disease    Urinary problem    Valvular heart disease    Vitamin B12 deficiency    Vitamin D deficiency    Past Surgical History:  Procedure Laterality Date   ABDOMINAL HYSTERECTOMY  1976   CHOLECYSTECTOMY  2000   COLONOSCOPY  07/27/2014   Moderate predominantly sigmoid divertuculosis. Otherwise normal colonoscopy   CORONARY ARTERY BYPASS GRAFT N/A 07/10/2018   Procedure: CORONARY ARTERY BYPASS GRAFTING (CABG), FREE LIMA;  Surgeon: Gaye Pollack, MD;  Location: [facility];  Service: Open Heart Surgery;  Laterality: N/A;   CYST REMOVAL NECK     CYSTECTOMY  1974   Intestine   CYSTECTOMY  1996   Brain stem   ESOPHAGOGASTRODUODENOSCOPY  12/07/2016   Schatzki's ring status post esophageal dilatation. Small hiatal hernia. Mild gastritis   EYE SURGERY  2003   EYE SURGERY  07/2016   LEFT HEART CATH AND CORONARY ANGIOGRAPHY N/A 07/08/2018   Procedure: LEFT HEART CATH AND CORONARY ANGIOGRAPHY;  Surgeon: Lorretta Harp, MD;  Location: [facility];  Service: Cardiovascular;  Laterality: N/A;   LEFT HEART CATH AND CORONARY ANGIOGRAPHY N/A 07/25/2019   Procedure: LEFT HEART CATH AND CORONARY ANGIOGRAPHY;  Surgeon: Burnell Blanks, MD;  Location: [facility];  Service: Cardiovascular;  Laterality: N/A;   MOUTH SURGERY  2013   RADIOLOGY WITH ANESTHESIA N/A 11/05/2019   Procedure: MRI WITH ANESTHESIA;  Surgeon: Radiologist, Medication, MD;  Location: [facility];  Service: Radiology;  Laterality: N/A;   TEE WITHOUT CARDIOVERSION N/A 07/10/2018   Procedure: TRANSESOPHAGEAL ECHOCARDIOGRAM (TEE);  Surgeon: Gaye Pollack, MD;   Location: [facility];  Service: Open Heart Surgery;  Laterality: N/A;   TONSILLECTOMY     age 34    Family History  Problem Relation Age of Onset   Heart attack Father    Stroke Father    Cancer Sister    Cancer Brother    Asthma Brother    Cancer Brother    Social History   Socioeconomic History   Marital status: Married    Spouse name: Not on file   Number of children: 3   Years of education: college   Highest education level: Not on file  Occupational History   Occupation: Retired  Tobacco Use   Smoking status: Never   Smokeless tobacco: Never  Vaping Use   Vaping Use: Never used  Substance and Sexual Activity   Alcohol use: No   Drug use: No   Sexual activity: Not on file  Other Topics Concern   Not on file  Social History Narrative   Lives with husband in [place], [place]   Social Determinants of Health   Financial Resource Strain: Not on file  Food Insecurity: No Food Insecurity   Worried About Charity fundraiser in the Last Year: Never true   Ran Out of Food in the Last Year: Never true  Transportation Needs: Not on file  Physical Activity: Not on file  Stress: Not on file  Social Connections: Not on file    Review of Systems  Constitutional:  Positive for fatigue. Negative for chills and fever.  HENT:  Negative for congestion, rhinorrhea and sore throat.   Respiratory:  Positive for cough (mild ) and shortness of breath.   Cardiovascular:  Negative for chest pain.  Gastrointestinal:  Negative for abdominal pain, constipation, diarrhea, nausea and vomiting.  Genitourinary:  Negative for dysuria and urgency.  Musculoskeletal:  Positive for back pain. Negative for myalgias.  Neurological:  Positive for light-headedness. Negative for dizziness, weakness and headaches.       Balance issues  Psychiatric/Behavioral:  Negative for dysphoric mood. The patient is not nervous/anxious.     Objective:  BP (!) 144/64   Pulse 70   Temp (!) 97.5 F (36.4 C)   Resp 16    Ht _0  (1.626 m)   Wt 122 lb 6.4 oz (55.5 kg)   BMI 21.01 kg/m   BP/Weight 07/22/2020 07/16/2020 4/31/5400  Systolic BP 867 619 -  Diastolic BP 64 55 -  Wt. (Lbs) 122.4 118 -  BMI 21.01 - 20.25    Physical Exam Vitals reviewed.  Constitutional:      Appearance: Normal appearance. She is normal weight.  Cardiovascular:     Rate and Rhythm: Normal rate and regular rhythm.     Heart sounds: Normal heart sounds.  Pulmonary:     Effort: Pulmonary effort is normal. No respiratory distress.     Breath sounds: Normal breath sounds.  Abdominal:     General: Abdomen is flat. Bowel sounds are normal.     Palpations: Abdomen is soft.     Tenderness: There is no abdominal tenderness.  Neurological:     Mental Status: She is alert and oriented to person, place, and time.  Psychiatric:        Mood and Affect: Mood normal.        Behavior: Behavior normal.    Diabetic Foot Exam - Simple   No data filed      Lab Results  Component Value Date   WBC 8.5 07/16/2020   HGB 12.1 07/16/2020   HCT 36.2 07/16/2020   PLT 242 07/16/2020   GLUCOSE 96 07/15/2020   CHOL 250 (H) 06/14/2020   TRIG 125 06/14/2020   HDL 65 06/14/2020   LDLCALC 163 (H) 06/14/2020   ALT 21 07/14/2020   AST 21 07/14/2020   NA 137 07/15/2020   K 4.1 07/15/2020   CL 109 07/15/2020   CREATININE 0.69 07/15/2020   BUN 10 07/15/2020   CO2 23 07/15/2020   TSH 2.291 07/15/2020   INR 0.9 07/14/2020   HGBA1C 5.8 (H) 07/10/2018      Assessment & Plan:   1. Shortness of  breath 2. Physical deconditioning 3. Generalized weakness 4. Other dysphagia   Start on breztri 2 puffs twice a day instead of symbicort since pt was already given a sample of breztri at her last visit.   Keep appointment with lung specialist.  I do not see that a GI referral was done, but she has seen GI numerous times in the past and I do not feel this is needed at this time. eee   Follow-up: No follow-ups on file.  An After Visit  Summary was printed and given to the patient.  Rochel Brome, MD [name] [phone]

## 2020-07-22 NOTE — Patient Instructions (Addendum)
Start on breztri 2 puffs twice a day instead of symbicort since pt was already given a sample of breztri at her last visit.   Keep appointment with lung specialist.  I do not see that a GI referral was done, but she has seen GI numerous times in the past and I do not feel this is needed at this time. eee

## 2020-08-05 ENCOUNTER — Telehealth: Payer: Self-pay

## 2020-08-05 NOTE — Progress Notes (Signed)
    Chronic Care Management Pharmacy Assistant   Name: Leah Pittman  MRN: [id number] DOB: 10/07/1930  Reason for Encounter: Medication Review for PAP information needed    Medications: Outpatient Encounter Medications as of 08/05/2020  Medication Sig   acetaminophen (TYLENOL) 325 MG tablet Take 975 mg by mouth every 8 (eight) hours as needed for mild pain or headache.    albuterol (VENTOLIN HFA) 108 (90 Base) MCG/ACT inhaler Inhale 2 puffs into the lungs every 4 (four) hours as needed for wheezing or shortness of breath.   aspirin 81 MG tablet Take 81 mg by mouth daily.   benzonatate (TESSALON) 100 MG capsule Take 1 capsule (100 mg total) by mouth 2 (two) times daily as needed for cough.   budesonide-formoterol (SYMBICORT) 80-4.5 MCG/ACT inhaler Inhale 2 puffs into the lungs in the morning and at bedtime.   Calcium Citrate-Vitamin D (CALCIUM CITRATE+D3 PETITES PO) Take 1 tablet by mouth daily.   dorzolamide-timolol (COSOPT) 22.3-6.8 MG/ML ophthalmic solution Place 1 drop into the left eye 2 (two) times daily.    levothyroxine (SYNTHROID) 100 MCG tablet Take 1 tablet (100 mcg total) by mouth daily before breakfast.   losartan (COZAAR) 25 MG tablet Take 1 tablet (25 mg total) by mouth daily.   magnesium oxide (MAG-OX) 400 MG tablet Take 400 mg by mouth daily. Chewable   nitroGLYCERIN (NITROSTAT) 0.4 MG SL tablet Place 0.4 mg under the tongue every 5 (five) minutes as needed for chest pain.    pantoprazole (PROTONIX) 40 MG tablet Take 1 tablet (40 mg total) by mouth 2 (two) times daily. (Patient taking differently: Take 40 mg by mouth daily.)   potassium chloride SA (KLOR-CON) 20 MEQ tablet Take 20 mEq by mouth daily.   Probiotic Product (PROBIOTIC-10 PO) Take 1 tablet by mouth daily in the afternoon.   Sennosides-Docusate Sodium (SENNA PLUS PO) Take 1 tablet by mouth daily as needed (constipation).   simvastatin (ZOCOR) 20 MG tablet TAKE 1 TABLET BY MOUTH EVERYDAY AT BEDTIME (Patient  taking differently: Take 20 mg by mouth at bedtime.)   Spacer/Aero-Holding Chambers (AEROCHAMBER Z-STAT PLUS/MEDIUM) inhaler Use as instructed   Vitamin D, Cholecalciferol, 50 MCG (2000 UT) CAPS Take 2,000 Units by mouth daily.   XELPROS 0.005 % EMUL Place 1 drop into the left eye at bedtime.   No facility-administered encounter medications on file as of 08/05/2020.   Spoke to patient husband, let him know the PAP company would not take a verbal as far as income, they needed written proof such and statement from bank, tax return or letter from social security.  He stated he would get the info together and bring to the office.   Leilani Able, CMA Clinical Pharmacist Assistant [phone]

## 2020-08-10 ENCOUNTER — Telehealth: Payer: Medicare Other

## 2020-08-10 NOTE — Progress Notes (Deleted)
Chronic Care Management Pharmacy Note  08/10/2020 Name:  Leah Pittman MRN:  [id number] DOB:  [birth year]  Subjective: Leah Pittman is an 87 y.o. year old female who is a primary patient of [name], [name], MD.  The CCM team was consulted for assistance with disease management and care coordination needs.    Engaged with patient by telephone for follow up visit in response to provider referral for pharmacy case management and/or care coordination services.   Consent to Services:  The patient was given information about Chronic Care Management services, agreed to services, and gave verbal consent prior to initiation of services.  Please see initial visit note for detailed documentation.   Patient Care Team: Rochel Brome, MD as PCP - General (Family Medicine) Sanda Klein, MD as PCP - Cardiology (Cardiology) Burnice Logan, [facility] as Pharmacist (Pharmacist) Burnice Logan, [facility] as Pharmacist (Pharmacist)  Recent office visits: 12/22/2019 - no source of muscle/back pain detected in labs.   Recent consult visits: 03/03/2020 - Opthamology - start Travatan Z, Stop latanoprost. Artificial tears prn. Dorzolamide-timolol.  01/28/2020 - cardiology - stop ranolazine. Consider PCSK9 at next labs. Echo ordered 01/09/2020 - GI - lactulose 1 tablespoonful daily. Continue pantoprazole 40 mg daily. Zenpep with meals.   Hospital visits: 10/2019 [facility] admission for chest pain/hypertensive crisis   Objective:  Lab Results  Component Value Date   CREATININE 0.69 07/15/2020   BUN 10 07/15/2020   GFR 60.94 01/27/2020   GFRNONAA >60 07/15/2020   GFRAA 64 12/22/2019   NA 137 07/15/2020   K 4.1 07/15/2020   CALCIUM 9.4 07/15/2020   CO2 23 07/15/2020    Lab Results  Component Value Date/Time   HGBA1C 5.8 (H) 07/10/2018 04:21 AM   GFR 60.94 01/27/2020 04:11 PM    Last diabetic Eye exam: No results found for: HMDIABEYEEXA  Last diabetic Foot exam: No results found for: HMDIABFOOTEX    Lab Results  Component Value Date   CHOL 250 (H) 06/14/2020   HDL 65 06/14/2020   LDLCALC 163 (H) 06/14/2020   TRIG 125 06/14/2020   CHOLHDL 3.8 06/14/2020    Hepatic Function Latest Ref Rng & Units 07/14/2020 06/14/2020 01/27/2020  Total Protein 6.5 - 8.1 g/dL 6.7 6.8 6.8  Albumin 3.5 - 5.0 g/dL 4.0 4.6 4.0  AST 15 - 41 U/L _0 ALT 0 - 44 U/L _1 Alk Phosphatase 38 - 126 U/L 58 73 61  Total Bilirubin 0.3 - 1.2 mg/dL 1.0 0.4 0.3  Bilirubin, Direct 0.0 - 0.2 mg/dL - - -    Lab Results  Component Value Date/Time   TSH 2.291 07/15/2020 11:47 AM   TSH 4.450 06/14/2020 09:23 AM   TSH 0.879 12/22/2019 11:40 AM   FREET4 1.14 (H) 11/04/2019 06:17 AM    CBC Latest Ref Rng & Units 07/16/2020 07/15/2020 07/14/2020  WBC 4.0 - 10.5 K/uL 8.5 9.2 -  Hemoglobin 12.0 - 15.0 g/dL 12.1 11.9(L) 12.9  Hematocrit 36.0 - 46.0 % 36.2 34.5(L) 38.0  Platelets 150 - 400 K/uL 242 236 -    No results found for: VD25OH  Clinical ASCVD: Yes  The ASCVD Risk score Mikey Bussing DC Jr., et al., 2013) failed to calculate for the following reasons:   The 2013 ASCVD risk score is only valid for ages 28 to 62   The patient has a prior MI or stroke diagnosis    Depression screen [facility] 2/9 07/22/2020 12/22/2019 09/11/2018  Decreased Interest 0  0 0  Down, Depressed, Hopeless 0 0 0  PHQ - 2 Score 0 0 0     Other: (CHADS2VASc if Afib, MMRC or CAT for COPD, ACT, DEXA)  Social History   Tobacco Use  Smoking Status Never  Smokeless Tobacco Never   BP Readings from Last 3 Encounters:  07/22/20 (!) 144/64  07/16/20 (!) 139/55  07/14/20 130/78   Pulse Readings from Last 3 Encounters:  07/22/20 70  07/16/20 65  07/14/20 71   Wt Readings from Last 3 Encounters:  07/22/20 122 lb 6.4 oz (55.5 kg)  07/16/20 118 lb (53.5 kg)  07/14/20 125 lb (56.7 kg)    Assessment/Interventions: Review of patient past medical history, allergies, medications, health status, including review of consultants reports,  laboratory and other test data, was performed as part of comprehensive evaluation and provision of chronic care management services.   SDOH:  (Social Determinants of Health) assessments and interventions performed: Yes   CCM Care Plan  Allergies  Allergen Reactions   Benadryl [Diphenhydramine] Swelling and Other (See Comments)    Tongue swells and hallucinates- could not talk   Lipase Concentrate-Hp [Digestive Enzymes] Rash   Zenpep [Pancrelipase (Lip-Prot-Amyl)] Rash   Codeine Nausea And Vomiting and Hypertension   Meperidine Nausea Only, Swelling and Other (See Comments)    Tongue swells and "I feel like death"   Other Other (See Comments)    Tears the skin   Prednisone     Hypertension    Promethazine Other (See Comments)    Hypotension    Propranolol Nausea And Vomiting   Shellfish Allergy Nausea And Vomiting   Tape Other (See Comments)    Tears the skin   Tazarotene Other (See Comments)    Reaction not recalled    Iodinated Diagnostic Agents Nausea Only and Rash    Medications Reviewed Today     Reviewed by Rochel Brome, MD (Physician) on 07/22/20 at [place] List Status: <None>   Medication Order Taking? Sig Documenting Provider Last Dose Status Informant  acetaminophen (TYLENOL) 325 MG tablet [id number] No Take 975 mg by mouth every 8 (eight) hours as needed for mild pain or headache.  [provider] unk Active Child  albuterol (VENTOLIN HFA) 108 (90 Base) MCG/ACT inhaler [id number]  Inhale 2 puffs into the lungs every 4 (four) hours as needed for wheezing or shortness of breath. Sharion Settler, DO  Active   aspirin 81 MG tablet [id number] No Take 81 mg by mouth daily. [provider] 07/14/2020 Unknown time Active Child  benzonatate (TESSALON) 100 MG capsule [id number] No Take 1 capsule (100 mg total) by mouth 2 (two) times daily as needed for cough. Rip Harbour, NP unk Active Child  budesonide-formoterol [facility]) 80-4.5 MCG/ACT inhaler  [id number]  Inhale 2 puffs into the lungs in the morning and at bedtime. Sharion Settler, DO  Active   Calcium Citrate-Vitamin D (CALCIUM CITRATE+D3 PETITES PO) [id number] No Take 1 tablet by mouth daily. [provider] 07/14/2020 Unknown time Active Child  dorzolamide-timolol (COSOPT) 22.3-6.8 MG/ML ophthalmic solution [id number] No Place 1 drop into the left eye 2 (two) times daily.  [provider] 07/14/2020 Unknown time Active Child  levothyroxine (SYNTHROID) 100 MCG tablet [id number] No Take 1 tablet (100 mcg total) by mouth daily before breakfast. Rochel Brome, MD 07/14/2020 Unknown time Active Child  losartan (COZAAR) 25 MG tablet [id number]  Take 1 tablet (25 mg total) by mouth daily. Sharion Settler, DO  Active   magnesium oxide (  MAG-OX) 400 MG tablet [id number] No Take 400 mg by mouth daily. Chewable [provider] 07/13/2020 Unknown time Active Child  nitroGLYCERIN (NITROSTAT) 0.4 MG SL tablet [id number] No Place 0.4 mg under the tongue every 5 (five) minutes as needed for chest pain.  [provider] unk Active Child           Med Note [place] Jul 24, 2019  7:58 PM)    pantoprazole (PROTONIX) 40 MG tablet [id number] No Take 1 tablet (40 mg total) by mouth 2 (two) times daily.  Patient taking differently: Take 40 mg by mouth daily.   Rochel Brome, MD 07/14/2020 Unknown time Active Child  potassium chloride SA (KLOR-CON) 20 MEQ tablet [id number] No Take 20 mEq by mouth daily. [provider] 07/13/2020 Unknown time Active Child  Probiotic Product (PROBIOTIC-10 PO) [id number] No Take 1 tablet by mouth daily in the afternoon. [provider] 07/13/2020 Unknown time Active Child  Sennosides-Docusate Sodium (SENNA PLUS PO) [id number] No Take 1 tablet by mouth daily as needed (constipation). [provider] unk Active Child  simvastatin (ZOCOR) 20 MG tablet [id number] No TAKE 1 TABLET BY MOUTH EVERYDAY AT BEDTIME  Patient  taking differently: Take 20 mg by mouth at bedtime.   Rochel Brome, MD 07/13/2020 Unknown time Active Child  Spacer/Aero-Holding Chambers (AEROCHAMBER Z-STAT PLUS/MEDIUM) inhaler [id number]  Use as instructed Sharion Settler, DO  Active   Vitamin D, Cholecalciferol, 50 MCG (2000 UT) CAPS [id number] No Take 2,000 Units by mouth daily. [provider] 07/13/2020 Unknown time Active Child  XELPROS 0.005 % EMUL [id number] No Place 1 drop into the left eye at bedtime. 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   Unstable gait 12/22/2019   Paresthesia of both feet 12/22/2019   Bruising 12/22/2019   Hypothyroidism    Pancreatic duct dilated    Descending thoracic aortic dissection (HCC)    Elevated blood pressure reading    Acute cystitis with hematuria 08/20/2019   Hoarseness of voice 08/20/2019   Medication management 08/20/2019   SOB (shortness of breath) 08/20/2019   Irritable bowel syndrome with constipation 08/20/2019   Chronic obstructive pulmonary disease ([place]) 08/20/2019   Abdominal pain, chronic, epigastric 08/20/2019   Chest pain 07/24/2019   Hypertensive crisis 07/24/2019   Neck pain 07/24/2019   S/P CABG x 4 07/10/2018   Coronary artery disease involving native coronary artery of native heart without angina pectoris 07/08/2018   Essential hypertension 08/14/2017   Nuclear sclerotic cataract, left 06/23/2016   Hypercholesterolemia 11/30/2015   Precordial chest pain 01/13/2015   Bilateral occipital neuralgia 12/16/2014   Osteoarthritis of cervical spine 12/16/2014   Chest pain with high risk for cardiac etiology 12/12/2013   GERD (gastroesophageal reflux disease) 04/01/2013   Epiphora 02/03/2013   History of Clostridium difficile infection 02/03/2013   History of dacryocystitis 02/03/2013    Stenosis of nasolacrimal duct, 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   There are no care plans that you recently modified to display for this patient.    Medication Assistance: None required.  Patient affirms current coverage meets needs.  Patient's preferred pharmacy is:  [id number] [address] [place] [address]Phone: [phone] Fax: [phone] [facility] [place] [address] [place]: [phone]: [phone] [address] [place]: [phone]: [phone] Uses pill box? Yes Pt endorses good compliance  We discussed: Benefits of medication synchronization, packaging and delivery as well as enhanced pharmacist oversight with [facility]. Patient decided to: Continue current medication management strategy  Follow Up:  Patient agrees to Care Plan and Follow-up.  Plan: Telephone follow up appointment with care management team member scheduled for:  09/2020

## 2020-08-11 ENCOUNTER — Telehealth: Payer: Medicare Other

## 2020-08-13 ENCOUNTER — Other Ambulatory Visit: Payer: Self-pay

## 2020-08-13 ENCOUNTER — Encounter: Payer: Self-pay | Admitting: Pulmonary Disease

## 2020-08-13 ENCOUNTER — Ambulatory Visit (INDEPENDENT_AMBULATORY_CARE_PROVIDER_SITE_OTHER): Payer: Medicare Other

## 2020-08-13 ENCOUNTER — Ambulatory Visit (INDEPENDENT_AMBULATORY_CARE_PROVIDER_SITE_OTHER): Payer: Medicare Other | Admitting: Pulmonary Disease

## 2020-08-13 VITALS — BP 162/68 | HR 65 | Ht 64.0 in | Wt 121.0 lb

## 2020-08-13 DIAGNOSIS — I10 Essential (primary) hypertension: Secondary | ICD-10-CM | POA: Diagnosis not present

## 2020-08-13 DIAGNOSIS — I25110 Atherosclerotic heart disease of native coronary artery with unstable angina pectoris: Secondary | ICD-10-CM

## 2020-08-13 DIAGNOSIS — R0600 Dyspnea, unspecified: Secondary | ICD-10-CM

## 2020-08-13 DIAGNOSIS — R0609 Other forms of dyspnea: Secondary | ICD-10-CM

## 2020-08-13 NOTE — Progress Notes (Signed)
Chronic Care Management Pharmacy Note  08/13/2020 Name:  Leah Pittman MRN:  [id number] DOB:  [birth year]  Summary:  Patient reports feeling some better. She is seeing pulmonology today. Patient [place]  [place] and [place] for either Symbicort or Breztri if needed going forwards.   Subjective: Leah Pittman is an 87 y.o. year old female who is a primary patient of Pittman, [name], MD.  The CCM team was consulted for assistance with disease management and care coordination needs.    Engaged with patient by telephone for follow up visit in response to provider referral for pharmacy case management and/or care coordination services.   Consent to Services:  The patient was given information about Chronic Care Management services, agreed to services, and gave verbal consent prior to initiation of services.  Please see initial visit note for detailed documentation.   Patient Care Team: Rochel Brome, MD as PCP - General (Family Medicine) Croitoru, Dani Gobble, MD as PCP - Cardiology (Cardiology) Burnice Logan, [facility] as Pharmacist (Pharmacist) Burnice Logan, [facility] as Pharmacist (Pharmacist)  Recent office visits: 07/14/2020: Leah Maxwell [name],MD (PCP) for shortness of breath/ discontinued Amoxicillin and Prednisone   06/30/2020: Leah Belfast, NP (PCP) / add Benzonatate 100 mg tablet twice daily   06/29/2020: Leah Belfast, NP (PCP) / add Amoxicillin-Pot Clavulanate 875-125 mg. Tablet: take 1 tablet twice daily, Prednisone 10 mg daily with breakfast / COVID test administered, 2-view chest x-ray obtained, injection Ceftriaxone 1g./ triamcinolone 60 mg.administered.  12/22/2019 - no source of muscle/back pain detected in labs.   Recent consult visits: 03/03/2020 - Opthamology - start Travatan Z, Stop latanoprost. Artificial tears prn. Dorzolamide-timolol.  01/28/2020 - cardiology - stop ranolazine. Consider PCSK9 at next labs. Echo ordered 01/09/2020 - GI - lactulose 1 tablespoonful daily. Continue  pantoprazole 40 mg daily. Zenpep with meals.   Hospital visits: 10/2019 [facility] admission for chest pain/hypertensive crisis   Objective:  Lab Results  Component Value Date   CREATININE 0.69 07/15/2020   BUN 10 07/15/2020   GFR 60.94 01/27/2020   GFRNONAA >60 07/15/2020   GFRAA 64 12/22/2019   NA 137 07/15/2020   K 4.1 07/15/2020   CALCIUM 9.4 07/15/2020   CO2 23 07/15/2020    Lab Results  Component Value Date/Time   HGBA1C 5.8 (H) 07/10/2018 04:21 AM   GFR 60.94 01/27/2020 04:11 PM    Last diabetic Eye exam: No results found for: HMDIABEYEEXA  Last diabetic Foot exam: No results found for: HMDIABFOOTEX   Lab Results  Component Value Date   CHOL 250 (H) 06/14/2020   HDL 65 06/14/2020   LDLCALC 163 (H) 06/14/2020   TRIG 125 06/14/2020   CHOLHDL 3.8 06/14/2020    Hepatic Function Latest Ref Rng & Units 07/14/2020 06/14/2020 01/27/2020  Total Protein 6.5 - 8.1 g/dL 6.7 6.8 6.8  Albumin 3.5 - 5.0 g/dL 4.0 4.6 4.0  AST 15 - 41 U/L $Remo'21 13 14  'aZFJz$ ALT 0 - 44 U/L $Remo'21 16 15  'MjZSW$ Alk Phosphatase 38 - 126 U/L 58 73 61  Total Bilirubin 0.3 - 1.2 mg/dL 1.0 0.4 0.3  Bilirubin, Direct 0.0 - 0.2 mg/dL - - -    Lab Results  Component Value Date/Time   TSH 2.291 07/15/2020 11:47 AM   TSH 4.450 06/14/2020 09:23 AM   TSH 0.879 12/22/2019 11:40 AM   FREET4 1.14 (H) 11/04/2019 06:17 AM    CBC Latest Ref Rng & Units 07/16/2020 07/15/2020 07/14/2020  WBC 4.0 - 10.5 K/uL 8.5 9.2 -  Hemoglobin 12.0 - 15.0 g/dL 12.1 11.9(L) 12.9  Hematocrit 36.0 - 46.0 % 36.2 34.5(L) 38.0  Platelets 150 - 400 K/uL 242 236 -    No results found for: VD25OH  Clinical ASCVD: Yes  The ASCVD Risk score Mikey Bussing DC Jr., et al., 2013) failed to calculate for the following reasons:   The 2013 ASCVD risk score is only valid for ages 9 to 8   The patient has a prior MI or stroke diagnosis    Depression screen [facility] 2/9 07/22/2020 12/22/2019 09/11/2018  Decreased Interest 0 0 0  Down, Depressed, Hopeless 0 0 0  PHQ -  2 Score 0 0 0     Other: (CHADS2VASc if Afib, MMRC or CAT for COPD, ACT, DEXA)  Social History   Tobacco Use  Smoking Status Never  Smokeless Tobacco Never   BP Readings from Last 3 Encounters:  07/22/20 (!) 144/64  07/16/20 (!) 139/55  07/14/20 130/78   Pulse Readings from Last 3 Encounters:  07/22/20 70  07/16/20 65  07/14/20 71   Wt Readings from Last 3 Encounters:  07/22/20 122 lb 6.4 oz (55.5 kg)  07/16/20 118 lb (53.5 kg)  07/14/20 125 lb (56.7 kg)    Assessment/Interventions: Review of patient past medical history, allergies, medications, health status, including review of consultants reports, laboratory and other test data, was performed as part of comprehensive evaluation and provision of chronic care management services.   SDOH:  (Social Determinants of Health) assessments and interventions performed: Yes   CCM Care Plan  Allergies  Allergen Reactions   Benadryl [Diphenhydramine] Swelling and Other (See Comments)    Tongue swells and hallucinates- could not talk   Lipase Concentrate-Hp [Digestive Enzymes] Rash   Zenpep [Pancrelipase (Lip-Prot-Amyl)] Rash   Codeine Nausea And Vomiting and Hypertension   Meperidine Nausea Only, Swelling and Other (See Comments)    Tongue swells and "I feel like death"   Other Other (See Comments)    Tears the skin   Prednisone     Hypertension    Promethazine Other (See Comments)    Hypotension    Propranolol Nausea And Vomiting   Shellfish Allergy Nausea And Vomiting   Tape Other (See Comments)    Tears the skin   Tazarotene Other (See Comments)    Reaction not recalled    Iodinated Diagnostic Agents Nausea Only and Rash    Medications Reviewed Today     Reviewed by Rochel Brome, MD (Physician) on 07/22/20 at [place] List Status: <None>   Medication Order Taking? Sig Documenting Provider Last Dose Status Informant  acetaminophen (TYLENOL) 325 MG tablet [id number] No Take 975 mg by mouth every 8 (eight) hours  as needed for mild pain or headache.  [provider] unk Active Child  albuterol (VENTOLIN HFA) 108 (90 Base) MCG/ACT inhaler [id number]  Inhale 2 puffs into the lungs every 4 (four) hours as needed for wheezing or shortness of breath. Sharion Settler, DO  Active   aspirin 81 MG tablet [id number] No Take 81 mg by mouth daily. [provider] 07/14/2020 Unknown time Active Child  benzonatate (TESSALON) 100 MG capsule [id number] No Take 1 capsule (100 mg total) by mouth 2 (two) times daily as needed for cough. Rip Harbour, NP unk Active Child  budesonide-formoterol [facility]) 80-4.5 MCG/ACT inhaler [id number]  Inhale 2 puffs into the lungs in the morning and at bedtime. Sharion Settler, DO  Active   Calcium Citrate-Vitamin D (CALCIUM CITRATE+D3 PETITES PO) [id number] No  Take 1 tablet by mouth daily. [provider] 07/14/2020 Unknown time Active Child  dorzolamide-timolol (COSOPT) 22.3-6.8 MG/ML ophthalmic solution [id number] No Place 1 drop into the left eye 2 (two) times daily.  [provider] 07/14/2020 Unknown time Active Child  levothyroxine (SYNTHROID) 100 MCG tablet [id number] No Take 1 tablet (100 mcg total) by mouth daily before breakfast. Blane Ohara, MD 07/14/2020 Unknown time Active Child  losartan (COZAAR) 25 MG tablet [id number]  Take 1 tablet (25 mg total) by mouth daily. Sabino Dick, DO  Active   magnesium oxide (MAG-OX) 400 MG tablet [id number] No Take 400 mg by mouth daily. Chewable [provider] 07/13/2020 Unknown time Active Child  nitroGLYCERIN (NITROSTAT) 0.4 MG SL tablet [id number] No Place 0.4 mg under the tongue every 5 (five) minutes as needed for chest pain.  [provider] unk Active Child           Med Note Toney Rakes Jul 24, 2019  7:58 PM)    pantoprazole (PROTONIX) 40 MG tablet [id number] No Take 1 tablet (40 mg total) by mouth 2 (two) times daily.  Patient taking differently: Take 40 mg by  mouth daily.   Blane Ohara, MD 07/14/2020 Unknown time Active Child  potassium chloride SA (KLOR-CON) 20 MEQ tablet [id number] No Take 20 mEq by mouth daily. [provider] 07/13/2020 Unknown time Active Child  Probiotic Product (PROBIOTIC-10 PO) [id number] No Take 1 tablet by mouth daily in the afternoon. [provider] 07/13/2020 Unknown time Active Child  Sennosides-Docusate Sodium (SENNA PLUS PO) [id number] No Take 1 tablet by mouth daily as needed (constipation). [provider] unk Active Child  simvastatin (ZOCOR) 20 MG tablet [id number] No TAKE 1 TABLET BY MOUTH EVERYDAY AT BEDTIME  Patient taking differently: Take 20 mg by mouth at bedtime.   Blane Ohara, MD 07/13/2020 Unknown time Active Child  Spacer/Aero-Holding Chambers (AEROCHAMBER Z-STAT PLUS/MEDIUM) inhaler [id number]  Use as instructed Sabino Dick, DO  Active   Vitamin D, Cholecalciferol, 50 MCG (2000 UT) CAPS [id number] No Take 2,000 Units by mouth daily. [provider] 07/13/2020 Unknown time Active Child  XELPROS 0.005 % EMUL [id number] No Place 1 drop into the left eye at bedtime. 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   Unstable gait 12/22/2019   Paresthesia of both feet 12/22/2019   Bruising 12/22/2019   Hypothyroidism    Pancreatic duct dilated    Descending thoracic aortic dissection (HCC)    Elevated blood pressure reading    Acute cystitis with hematuria 08/20/2019   Hoarseness of voice 08/20/2019   Medication management 08/20/2019   SOB (shortness of breath) 08/20/2019   Irritable bowel syndrome with constipation 08/20/2019   Chronic obstructive pulmonary disease (HCC) 08/20/2019   Abdominal pain, chronic, epigastric 08/20/2019   Chest pain 07/24/2019   Hypertensive crisis 07/24/2019    Neck pain 07/24/2019   S/P CABG x 4 07/10/2018   Coronary artery disease involving native coronary artery of native heart without angina pectoris 07/08/2018   Essential hypertension 08/14/2017   Nuclear sclerotic cataract, left 06/23/2016   Hypercholesterolemia 11/30/2015   Precordial chest pain 01/13/2015   Bilateral occipital neuralgia 12/16/2014   Osteoarthritis of cervical spine 12/16/2014   Chest pain with high risk for cardiac etiology 12/12/2013  GERD (gastroesophageal reflux disease) 04/01/2013   Epiphora 02/03/2013   History of Clostridium difficile infection 02/03/2013   History of dacryocystitis 02/03/2013   Stenosis of nasolacrimal duct, acquired 02/03/2013   Blindness, legal 01/30/2013   Pigmentary glaucoma of both eyes, 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/30/2013   Cellulitis, periorbital 10/18/2012   Glaucoma 09/29/2012   Anxiety    Labile hypertension    Migraine headache     Immunization History  Administered Date(s) Administered   Tdap 09/25/2014    Conditions to be addressed/monitored:  Hypertension, Hyperlipidemia, Coronary Artery Disease, COPD and IBS-C.   There are no care plans that you recently modified to display for this patient.    Medication Assistance: None required.  Patient affirms current coverage meets needs.  Patient's preferred pharmacy is:  [id number] [place], [address] [place] [address] Phone: [phone] Fax: [phone]  [facility] - [place], [place] - [address] [place] [address] Phone: [phone] Fax: [phone]  [place] [address] [place] [address] Phone: [phone] Fax: [phone]  Uses pill box? Yes Pt endorses good compliance  We discussed: Benefits of medication synchronization, packaging and delivery as well as enhanced pharmacist oversight with  [facility]. Patient decided to: Continue current medication management strategy  Follow Up:  Patient agrees to Care Plan and Follow-up.  Plan: Telephone follow up appointment with care management team member scheduled for:  09/2020

## 2020-08-13 NOTE — Patient Instructions (Addendum)
Nice to see you  Let's get some breathing tests to further evaluate the shortness of breath  Stop the inhalers, if the cough comes back resume Breztri 2 puffs twice a day. I can provide refills if needed.  I think pulmonary or cardiac rehab would be helpful to build up the stamina and improve the shortness of breath.  Return to clinic in 3 months

## 2020-08-13 NOTE — Progress Notes (Signed)
@Patient  ID: Leah MillinMary E Pittman, female    DOB: April 03, 1930, 87 y.o.   MRN: [id number]  Chief Complaint  Patient presents with   Consult    Short of breath, hospital stay, COPD    Referring provider: Blane Oharaox, [name], MD  HPI:   87 year old whom we are seeing in hospital follow-up for evaluation of dyspnea on exertion, cough.  In review, she was admitted with increased cough, dyspnea, mild hypoxemia.  Prior CT 04/2019 revealed bilateral basilar mild bronchiectasis primarily on my interpretation.  Scattered bronchiectasis elsewhere.  She endorses ongoing at that time and currently cough with eating or drinking.  Feels like she gets choked.  She endorses seasonal allergies. She was recently admitted. Treated with abx for pneumonia. High suspicion for aspiration pneumonia. No frank aspiration prior SLP eval.  Still SOB today. Pattern of breathing changes and is variable during visit. Says nebulized ICS/LABA was not helpful. Denies significant nasal congestion. Notes cough much better since hospitalization.    Questionaires / Pulmonary Flowsheets:   ACT:  No flowsheet data found.  MMRC: No flowsheet data found.  Epworth:  No flowsheet data found.  Tests:   FENO:  No results found for: NITRICOXIDE  PFT: No flowsheet data found.  WALK:  No flowsheet data found.  Imaging: Personally reviewed and as per EMR discussion this note MR BRAIN WO CONTRAST  Result Date: 07/14/2020 CLINICAL DATA:  Neuro deficit, acute, stroke suspected. EXAM: MRI HEAD WITHOUT CONTRAST TECHNIQUE: Multiplanar, multiecho pulse sequences of the brain and surrounding structures were obtained without intravenous contrast. COMPARISON:  Prior head CT 07/14/2020.  Brain MRI 11/14/2016. FINDINGS: Brain: Mild cerebral and cerebellar atrophy. Mild multifocal T2/FLAIR hyperintensity within the cerebral white matter is nonspecific, but compatible with chronic small vessel ischemic disease. There is no acute infarct. No  evidence of intracranial mass. No chronic intracranial blood products. No extra-axial fluid collection. No midline shift. Vascular: Expected proximal arterial flow voids. Skull and upper cervical spine: No focal marrow lesion. Sinuses/Orbits: Visualized orbits show no acute finding. Bilateral lens replacements. Trace bilateral ethmoid and right maxillary sinus mucosal thickening. Frothy secretions within the left sphenoid sinus. IMPRESSION: No evidence of acute intracranial abnormality. Mild generalized parenchymal atrophy and cerebral white matter chronic small vessel ischemic disease, stable as compared to the brain MRI of 11/14/2016. Mild paranasal sinus disease, as described. Electronically Signed   By: Jackey LogeKyle  Golden DO   On: 07/14/2020 17:00   MR CERVICAL SPINE WO CONTRAST  Result Date: 07/14/2020 CLINICAL DATA:  Myelopathy, acute or progressive. EXAM: MRI CERVICAL SPINE WITHOUT CONTRAST TECHNIQUE: Multiplanar, multisequence MR imaging of the cervical spine was performed. No intravenous contrast was administered. COMPARISON:  CT of the cervical spine 07/24/2019. Cervical spine MRI 11/14/2016. FINDINGS: Intermittently motion degraded exam. Most notably, there is moderate motion degradation of the axial T2 TSE sequence. Alignment: Straightening of the expected cervical lordosis. No significant spondylolisthesis. Vertebrae: Vertebral body height is maintained. No significant marrow edema or focal suspicious osseous lesion. Cord: Within limitations of motion degradation, no spinal cord signal abnormality is identified. Posterior Fossa, vertebral arteries, paraspinal tissues: Posterior fossa better assessed on concurrently performed brain MRI. Flow voids preserved within the imaged cervical vertebral arteries. Paraspinal soft tissues within normal limits. Disc levels: Unless otherwise stated, the level by level findings below have not significantly changed since prior MRI 11/14/2016. Multilevel disc degeneration.  Most notably, moderate disc degeneration is present at C6-C7. C2-C3: No significant disc herniation or stenosis. C3-C4: Mild uncinate hypertrophy on the right.  Minimal facet hypertrophy. No significant spinal canal stenosis or neural foraminal narrowing. C4-C5: Shallow disc bulge. Mild uncovertebral hypertrophy, facet arthrosis and ligamentum flavum hypertrophy. No significant spinal canal stenosis. Progressive mild/moderate right neural foraminal narrowing. C5-C6: Mild endplate spurring and uncovertebral hypertrophy. Mild ligamentum flavum hypertrophy. No significant spinal canal stenosis. Mild/moderate right neural foraminal narrowing. C6-C7: Shallow disc bulge with endplate spurring. Mild uncovertebral hypertrophy. Mild facet arthrosis and ligamentum flavum hypertrophy. No significant spinal canal stenosis. Mild right neural foraminal narrowing. C7-T1: No significant disc herniation or stenosis. IMPRESSION: Motion degraded examination, as described. Comparison is made to the prior cervical spine MRI of 11/14/2016. Cervical spondylosis, as outlined. No significant spinal canal stenosis. Progressive multifactorial mild/moderate right neural foraminal narrowing at C4-C5. Unchanged neural foraminal narrowing on the right at C5-C6 (mild/moderate), and on the right at C6-C7 (mild). Electronically Signed   By: Jackey Loge DO   On: 07/14/2020 17:07   DG Swallowing Func-Speech Pathology  Result Date: 07/16/2020 Objective Swallowing Evaluation: Type of Study: MBS-Modified Barium Swallow Study  Patient Details Name: Leah Pittman MRN: [id number] Date of Birth: 01/06/1931 Today's Date: 07/16/2020 Time: SLP Start Time (ACUTE ONLY): 1405 -SLP Stop Time (ACUTE ONLY): 1430 SLP Time Calculation (min) (ACUTE ONLY): 25 min Past Medical History: Past Medical History: Diagnosis Date  Anxiety   Arthritis   Chest pain 2011  CARDIOLITE - no significant symptoms, EKG changes, or arrhythmias  Congenital prolapse of bladder mucosa    Diverticulosis   Dyspnea 02/16/2012  2D ECHO - EF 55-60%, normal  Esophageal dysphagia   Fatigue   GERD (gastroesophageal reflux disease)   Glaucoma   Headache(784.0)   Heart attack (HCC)   Hiatal hernia   High blood pressure 02/16/2012  RENAL DOPPLER - normal  Hyperlipidemia   Hypothyroidism   Labile hypertension   Lightheadedness   Migraine headache   Multiple allergies   Myalgia   OSA (obstructive sleep apnea)   Pain, lower leg   Calf  Problem of menstruation   Proteinuria   Reflux   SOB (shortness of breath)   Thyroid disease   Urinary problem   Valvular heart disease   Vitamin B12 deficiency   Vitamin D deficiency  Past Surgical History: Past Surgical History: Procedure Laterality Date  ABDOMINAL HYSTERECTOMY  1976  CHOLECYSTECTOMY  2000  COLONOSCOPY  07/27/2014  Moderate predominantly sigmoid divertuculosis. Otherwise normal colonoscopy  CORONARY ARTERY BYPASS GRAFT N/A 07/10/2018  Procedure: CORONARY ARTERY BYPASS GRAFTING (CABG), FREE LIMA;  Surgeon: [name], MD;  Location: [facility] OR;  Service: Open Heart Surgery;  Laterality: N/A;  CYST REMOVAL NECK    CYSTECTOMY  1974  Intestine  CYSTECTOMY  1996  Brain stem  ESOPHAGOGASTRODUODENOSCOPY  12/07/2016  Schatzki's ring status post esophageal dilatation. Small hiatal hernia. Mild gastritis  EYE SURGERY  2003  EYE SURGERY  07/2016  LEFT HEART CATH AND CORONARY ANGIOGRAPHY N/A 07/08/2018  Procedure: LEFT HEART CATH AND CORONARY ANGIOGRAPHY;  Surgeon: Runell Gess, MD;  Location: [facility] INVASIVE [facility];  Service: Cardiovascular;  Laterality: N/A;  LEFT HEART CATH AND CORONARY ANGIOGRAPHY N/A 07/25/2019  Procedure: LEFT HEART CATH AND CORONARY ANGIOGRAPHY;  Surgeon: Kathleene Hazel, MD;  Location: [facility] INVAS[facility] CV [facility];  Service: Cardiovascular;  Laterality: N/A;  MOUTH SURGERY  2013  RADIOLOGY WITH ANESTHESIA N/A 11/05/2019  Procedure: MRI WITH ANESTHESIA;  Surgeon: Radiologist, Medication, MD;  Location: [facility] OR;  Service: Radiology;  Laterality: N/A;  TEE  WITHOUT CARDIOVERSION N/A 07/10/2018  Procedure: TRANSESOPHAGEAL ECHOCARDIOGRAM (TEE);  Surgeon: [name], MD;  Location: [facility] OR;  Service: Open Heart Surgery;  Laterality: N/A;  TONSILLECTOMY    age 11 HPI: 87 y.o. female who presented with SOB and CP concerning for ACS. PMH is significant for HTN, HLD, multi vessel CAD s/p CABG, hypothyroidism, Vit B12 deficiency, GERD, esophageal dysphagia, anxiety.Pulmonology suggests possible chronic aspiration.  CT cervical spine shows multilevel disc degeneration. Most notably, moderate disc degeneration is present at C6-C7. Barium swallow 02/04/20: Mild esophageal dysmotility is noted with break up of primary peristalsis in the mid to lower thoracic esophagus.  Subjective: anxious Assessment / Plan / Recommendation CHL IP CLINICAL IMPRESSIONS 07/16/2020 Clinical Impression Pt presents with functional oropharyngeal swallow with NO observed laryngeal penetration nor aspiration, despite coughing occasionally throughout study.  Oral phase was normal. There was mild, trace residue in the pharynx post-swallow, attributable to potential weakness but not concerning. Occasional scanning of the esophagus revealed some retention of solid barium, cleared with liquid wash and not inconsistent with findings from UGI in December of 2021.  Dysphagia-related aspiration is unlikely.  There is potential for nocturnal aspiration of esophageal stasis vs reflux.  After study, we discussed results and pt was provided with a handout that addressed esophageal precautions (elevating HOB, alternating sips of liquids with bites of solid foods, avoiding eating/drinking 2 hours before bedtime). Pt verbalized understanding.  No diet modifications need to be made; continue regular solids/thin liquids with above precautions.  SLP Visit Diagnosis Dysphagia, pharyngoesophageal phase (R13.14) Attention and concentration deficit following -- Frontal lobe and executive function deficit following -- Impact on  safety and function --   CHL IP TREATMENT RECOMMENDATION 07/16/2020 Treatment Recommendations No treatment recommended at this time   No flowsheet data found. CHL IP DIET RECOMMENDATION 07/16/2020 SLP Diet Recommendations Regular solids;Thin liquid Liquid Administration via Cup;Straw Medication Administration Whole meds with liquid Compensations Follow solids with liquid Postural Changes Remain semi-upright after after feeds/meals (Comment)   CHL IP OTHER RECOMMENDATIONS 07/16/2020 Recommended Consults -- Oral Care Recommendations Oral care BID Other Recommendations --   CHL IP FOLLOW UP RECOMMENDATIONS 07/16/2020 Follow up Recommendations None   No flowsheet data found.     CHL IP ORAL PHASE 07/16/2020 Oral Phase WFL Oral - Pudding Teaspoon -- Oral - Pudding Cup -- Oral - Honey Teaspoon -- Oral - Honey Cup -- Oral - Nectar Teaspoon -- Oral - Nectar Cup -- Oral - Nectar Straw -- Oral - Thin Teaspoon -- Oral - Thin Cup -- Oral - Thin Straw -- Oral - Puree -- Oral - Mech Soft -- Oral - Regular -- Oral - Multi-Consistency -- Oral - Pill -- Oral Phase - Comment --  CHL IP PHARYNGEAL PHASE 07/16/2020 Pharyngeal Phase Impaired Pharyngeal- Pudding Teaspoon -- Pharyngeal -- Pharyngeal- Pudding Cup -- Pharyngeal -- Pharyngeal- Honey Teaspoon -- Pharyngeal -- Pharyngeal- Honey Cup -- Pharyngeal -- Pharyngeal- Nectar Teaspoon -- Pharyngeal -- Pharyngeal- Nectar Cup -- Pharyngeal -- Pharyngeal- Nectar Straw -- Pharyngeal -- Pharyngeal- Thin Teaspoon -- Pharyngeal -- Pharyngeal- Thin Cup -- Pharyngeal -- Pharyngeal- Thin Straw Delayed swallow initiation-pyriform sinuses;Reduced pharyngeal peristalsis;Pharyngeal residue - pyriform Pharyngeal -- Pharyngeal- Puree Delayed swallow initiation-vallecula;Reduced pharyngeal peristalsis;Reduced tongue base retraction;Pharyngeal residue - valleculae Pharyngeal -- Pharyngeal- Mechanical Soft Delayed swallow initiation-vallecula;Reduced pharyngeal peristalsis;Pharyngeal residue -  valleculae;Pharyngeal residue - pyriform;Reduced tongue base retraction Pharyngeal -- Pharyngeal- Regular -- Pharyngeal -- Pharyngeal- Multi-consistency -- Pharyngeal -- Pharyngeal- Pill -- Pharyngeal -- Pharyngeal Comment --  No flowsheet data found. Blenda Mounts Laurice 07/16/2020, 2:46 PM  ECHOCARDIOGRAM COMPLETE  Result Date: 07/15/2020    ECHOCARDIOGRAM REPORT   Patient Name:   Leah Pittman Date of Exam: 07/15/2020 Medical Rec #:  [id number]       Height:       64.0 in Accession #:    [id number]      Weight:       118.9 lb Date of Birth:  1930-10-08      BSA:          1.568 m Patient Age:    87 years        BP:           156/59 mmHg Patient Gender: F               HR:           64 bpm. Exam Location:  Inpatient Procedure: 2D Echo, Cardiac Doppler and Color Doppler Indications:    NSTEMI  History:        Patient has prior history of Echocardiogram examinations, most                 recent 05/25/2020. Previous Myocardial Infarction,                 Signs/Symptoms:Shortness of Breath, Chest Pain, Dyspnea and                 Murmur; Risk Factors:Dyslipidemia.  Sonographer:    Neomia Dear RDCS Referring Phys: [id number] MARSHALL L CHAMBLISS IMPRESSIONS  1. Left ventricular ejection fraction, by estimation, is 60 to 65%. The left ventricle has normal function. The left ventricle has no regional wall motion abnormalities. Left ventricular diastolic parameters are consistent with Grade I diastolic dysfunction (impaired relaxation).  2. Right ventricular systolic function is normal. The right ventricular size is normal. There is normal pulmonary artery systolic pressure. The estimated right ventricular systolic pressure is 20.3 mmHg.  3. The mitral valve is grossly normal. Trivial mitral valve regurgitation. No evidence of mitral stenosis.  4. The aortic valve is tricuspid. Aortic valve regurgitation is not visualized. No aortic stenosis is present.  5. The inferior vena cava is normal in size with greater  than 50% respiratory variability, suggesting right atrial pressure of 3 mmHg. Comparison(s): No significant change from prior study. FINDINGS  Left Ventricle: Left ventricular ejection fraction, by estimation, is 60 to 65%. The left ventricle has normal function. The left ventricle has no regional wall motion abnormalities. The left ventricular internal cavity size was normal in size. There is  no left ventricular hypertrophy. Abnormal (paradoxical) septal motion consistent with post-operative status. Left ventricular diastolic parameters are consistent with Grade I diastolic dysfunction (impaired relaxation). Right Ventricle: The right ventricular size is normal. No increase in right ventricular wall thickness. Right ventricular systolic function is normal. There is normal pulmonary artery systolic pressure. The tricuspid regurgitant velocity is 2.08 m/s, and  with an assumed right atrial pressure of 3 mmHg, the estimated right ventricular systolic pressure is 20.3 mmHg. Left Atrium: Left atrial size was normal in size. Right Atrium: Right atrial size was normal in size. Pericardium: Trivial pericardial effusion is present. Mitral Valve: The mitral valve is grossly normal. Trivial mitral valve regurgitation. No evidence of mitral valve stenosis. MV peak gradient, 4.6 mmHg. The mean mitral valve gradient is 2.0 mmHg. Tricuspid Valve: The tricuspid valve is grossly normal. Tricuspid valve regurgitation is trivial. No evidence of tricuspid stenosis. Aortic Valve: The aortic valve is tricuspid. Aortic valve regurgitation is not visualized. No  aortic stenosis is present. Aortic valve mean gradient measures 3.0 mmHg. Aortic valve peak gradient measures 4.9 mmHg. Aortic valve area, by VTI measures 1.79 cm. Pulmonic Valve: The pulmonic valve was grossly normal. Pulmonic valve regurgitation is trivial. No evidence of pulmonic stenosis. Aorta: The aortic root and ascending aorta are structurally normal, with no evidence of  dilitation. Venous: The right upper pulmonary vein is normal. The inferior vena cava is normal in size with greater than 50% respiratory variability, suggesting right atrial pressure of 3 mmHg. IAS/Shunts: The atrial septum is grossly normal.  LEFT VENTRICLE PLAX 2D LVIDd:         3.60 cm     Diastology LVIDs:         2.20 cm     LV e' medial:    5.43 cm/s LV PW:         0.90 cm     LV E/e' medial:  16.0 LV IVS:        1.00 cm     LV e' lateral:   7.22 cm/s LVOT diam:     1.70 cm     LV E/e' lateral: 12.0 LV SV:         46 LV SV Index:   30 LVOT Area:     2.27 cm  LV Volumes (MOD) LV vol d, MOD A2C: 43.5 ml LV vol d, MOD A4C: 38.4 ml LV vol s, MOD A2C: 10.7 ml LV vol s, MOD A4C: 14.6 ml LV SV MOD A2C:     32.8 ml LV SV MOD A4C:     38.4 ml LV SV MOD BP:      29.8 ml RIGHT VENTRICLE RV Basal diam:  3.20 cm RV Mid diam:    2.00 cm RV S prime:     8.11 cm/s TAPSE (M-mode): 0.4 cm LEFT ATRIUM             Index       RIGHT ATRIUM           Index LA diam:        2.70 cm 1.72 cm/m  RA Area:     12.40 cm LA Vol (A2C):   36.8 ml 23.46 ml/m RA Volume:   27.10 ml  17.28 ml/m LA Vol (A4C):   25.8 ml 16.45 ml/m LA Biplane Vol: 33.0 ml 21.04 ml/m  AORTIC VALVE                   PULMONIC VALVE AV Area (Vmax):    1.93 cm    PV Vmax:       0.90 m/s AV Area (Vmean):   1.84 cm    PV Vmean:      63.400 cm/s AV Area (VTI):     1.79 cm    PV VTI:        0.196 m AV Vmax:           111.00 cm/s PV Peak grad:  3.2 mmHg AV Vmean:          78.000 cm/s PV Mean grad:  2.0 mmHg AV VTI:            0.258 m AV Peak Grad:      4.9 mmHg AV Mean Grad:      3.0 mmHg LVOT Vmax:         94.30 cm/s LVOT Vmean:        63.400 cm/s LVOT VTI:  0.204 m LVOT/AV VTI ratio: 0.79  AORTA Ao Root diam: 2.60 cm Ao Asc diam:  2.70 cm MITRAL VALVE                TRICUSPID VALVE MV Area (PHT): 3.34 cm     TR Peak grad:   17.3 mmHg MV Area VTI:   1.50 cm     TR Vmax:        208.00 cm/s MV Peak grad:  4.6 mmHg MV Mean grad:  2.0 mmHg     SHUNTS MV Vmax:        1.07 m/s     Systemic VTI:  0.20 m MV Vmean:      56.3 cm/s    Systemic Diam: 1.70 cm MV Decel Time: 227 msec MR Peak grad: 14.6 mmHg MR Vmax:      191.00 cm/s MV E velocity: 87.00 cm/s MV A velocity: 102.00 cm/s MV E/A ratio:  0.85 Lennie Odor MD Electronically signed by Lennie Odor MD Signature Date/Time: 07/15/2020/12:31:13 PM    Final     Lab Results: Personally reviewed, eos 300 CBC    Component Value Date/Time   WBC 8.5 07/16/2020 0225   RBC 3.85 (L) 07/16/2020 0225   HGB 12.1 07/16/2020 0225   HGB 12.7 06/14/2020 0922   HCT 36.2 07/16/2020 0225   HCT 37.3 06/14/2020 0922   PLT 242 07/16/2020 0225   PLT 230 06/14/2020 0922   MCV 94.0 07/16/2020 0225   MCV 93 06/14/2020 0922   MCH 31.4 07/16/2020 0225   MCHC 33.4 07/16/2020 0225   RDW 14.8 07/16/2020 0225   RDW 14.2 06/14/2020 0922   LYMPHSABS 2.2 07/14/2020 1246   LYMPHSABS 1.6 06/14/2020 0922   MONOABS 0.9 07/14/2020 1246   EOSABS 0.2 07/14/2020 1246   EOSABS 0.3 06/14/2020 0922   BASOSABS 0.1 07/14/2020 1246   BASOSABS 0.1 06/14/2020 0922    BMET    Component Value Date/Time   NA 137 07/15/2020 0413   NA 139 06/14/2020 0922   K 4.1 07/15/2020 0413   CL 109 07/15/2020 0413   CO2 23 07/15/2020 0413   GLUCOSE 96 07/15/2020 0413   BUN 10 07/15/2020 0413   BUN 16 06/14/2020 0922   CREATININE 0.69 07/15/2020 0413   CALCIUM 9.4 07/15/2020 0413   GFRNONAA >60 07/15/2020 0413   GFRAA 64 12/22/2019 1140    BNP    Component Value Date/Time   BNP 260.7 (H) 07/14/2020 1246    ProBNP No results found for: PROBNP  Specialty Problems       Pulmonary Problems   Chronic obstructive pulmonary disease (HCC)   SOB (shortness of breath)    Allergies  Allergen Reactions   Benadryl [Diphenhydramine] Swelling and Other (See Comments)    Tongue swells and hallucinates- could not talk   Lipase Concentrate-Hp [Digestive Enzymes] Rash   Zenpep [Pancrelipase (Lip-Prot-Amyl)] Rash   Codeine Nausea And Vomiting  and Hypertension   Meperidine Nausea Only, Swelling and Other (See Comments)    Tongue swells and "I feel like death"   Other Other (See Comments)    Tears the skin   Prednisone     Hypertension    Promethazine Other (See Comments)    Hypotension    Propranolol Nausea And Vomiting   Shellfish Allergy Nausea And Vomiting   Tape Other (See Comments)    Tears the skin   Tazarotene Other (See Comments)    Reaction not recalled    Iodinated Diagnostic Agents Nausea  Only and Rash    Immunization History  Administered Date(s) Administered   Tdap 09/25/2014    Past Medical History:  Diagnosis Date   Anxiety    Arthritis    Chest pain 2011   CARDIOLITE - no significant symptoms, EKG changes, or arrhythmias   Congenital prolapse of bladder mucosa    Diverticulosis    Dyspnea 02/16/2012   2D ECHO - EF 55-60%, normal   Esophageal dysphagia    Fatigue    GERD (gastroesophageal reflux disease)    Glaucoma    Headache(784.0)    Heart attack (HCC)    Hiatal hernia    High blood pressure 02/16/2012   RENAL DOPPLER - normal   Hyperlipidemia    Hypothyroidism    Labile hypertension    Lightheadedness    Migraine headache    Multiple allergies    Myalgia    OSA (obstructive sleep apnea)    Pain, lower leg    Calf   Problem of menstruation    Proteinuria    Reflux    SOB (shortness of breath)    Thyroid disease    Urinary problem    Valvular heart disease    Vitamin B12 deficiency    Vitamin D deficiency     Tobacco History: Social History   Tobacco Use  Smoking Status Never  Smokeless Tobacco Never   Counseling given: Not Answered   Continue to not smoke  Outpatient Encounter Medications as of 08/13/2020  Medication Sig   acetaminophen (TYLENOL) 325 MG tablet Take 975 mg by mouth every 8 (eight) hours as needed for mild pain or headache.    albuterol (VENTOLIN HFA) 108 (90 Base) MCG/ACT inhaler Inhale 2 puffs into the lungs every 4 (four) hours as needed for  wheezing or shortness of breath.   aspirin 81 MG tablet Take 81 mg by mouth daily.   benzonatate (TESSALON) 100 MG capsule Take 1 capsule (100 mg total) by mouth 2 (two) times daily as needed for cough.   budesonide-formoterol (SYMBICORT) 80-4.5 MCG/ACT inhaler Inhale 2 puffs into the lungs in the morning and at bedtime.   Calcium Citrate-Vitamin D (CALCIUM CITRATE+D3 PETITES PO) Take 1 tablet by mouth daily.   dorzolamide-timolol (COSOPT) 22.3-6.8 MG/ML ophthalmic solution Place 1 drop into the left eye 2 (two) times daily.    levothyroxine (SYNTHROID) 100 MCG tablet Take 1 tablet (100 mcg total) by mouth daily before breakfast.   losartan (COZAAR) 25 MG tablet Take 1 tablet (25 mg total) by mouth daily.   magnesium oxide (MAG-OX) 400 MG tablet Take 400 mg by mouth daily. Chewable   nitroGLYCERIN (NITROSTAT) 0.4 MG SL tablet Place 0.4 mg under the tongue every 5 (five) minutes as needed for chest pain.    pantoprazole (PROTONIX) 40 MG tablet Take 1 tablet (40 mg total) by mouth 2 (two) times daily. (Patient taking differently: Take 40 mg by mouth daily.)   potassium chloride SA (KLOR-CON) 20 MEQ tablet Take 20 mEq by mouth daily.   Probiotic Product (PROBIOTIC-10 PO) Take 1 tablet by mouth daily in the afternoon.   Sennosides-Docusate Sodium (SENNA PLUS PO) Take 1 tablet by mouth daily as needed (constipation).   simvastatin (ZOCOR) 20 MG tablet TAKE 1 TABLET BY MOUTH EVERYDAY AT BEDTIME (Patient taking differently: Take 20 mg by mouth at bedtime.)   Spacer/Aero-Holding Chambers (AEROCHAMBER Z-STAT PLUS/MEDIUM) inhaler Use as instructed   Vitamin D, Cholecalciferol, 50 MCG (2000 UT) CAPS Take 2,000 Units by mouth daily.  XELPROS 0.005 % EMUL Place 1 drop into the left eye at bedtime.   No facility-administered encounter medications on file as of 08/13/2020.     Review of Systems  Review of Systems  No chest with exertion.  No orthopnea or PND.  No lower extremity swelling.  Comprehensive  review of systems otherwise negative. Physical Exam  BP (!) 162/68 Comment: has not taken medication  Pulse 65   Ht  (1.626 m)   Wt 121 lb (54.9 kg)   SpO2 100%   BMI 20.77 kg/m   Wt Readings from Last 5 Encounters:  08/13/20 121 lb (54.9 kg)  07/22/20 122 lb 6.4 oz (55.5 kg)  07/16/20 118 lb (53.5 kg)  07/14/20 125 lb (56.7 kg)  06/30/20 122 lb (55.3 kg)    BMI Readings from Last 5 Encounters:  08/13/20 20.77 kg/m  07/22/20 21.01 kg/m  07/16/20 20.25 kg/m  07/14/20 21.46 kg/m  06/30/20 20.94 kg/m     Physical Exam General: Thin, pale, in NAD Eyes: EOMI, no icterus Neck: Supple, no JVP Cardiovascular: Regular rate and rhythm, no murmur Pulmonary: Clear to auscultation bilaterally, no wheezes or crackles, normal work of breathing Abdomen: Nondistended, bowel sounds present MSK: No synovitis, joint effusion Neuro: No weakness, normal gait Psych: Normal mood, full affect   Assessment & Plan:   DOE: Most likely related to deconditioning., Increased frequency of sigh breathing - high suspicion for hyperventilation syndrome. Possible contribution from bronchiectasis.  Possible cardiac contribution given elevated BNP in the past.  Possible asthma given her atopic symptoms.  However she has not responded to inhaled therapies.  This reduces likelihood.  PFTs for further evaluation.  Mild Bronchiectasis: on imaging. High suspicion for aspiration based on cough with eating/drinking, location of bronchiectasis in LL. --CTM, consider airway clearance if sputum production worsens   Return in about 3 months (around 11/13/2020).   Karren Burly, MD 08/13/2020   This appointment required 45 minutes of patient care (this includes precharting, chart review, review of results, face-to-face care, etc.).

## 2020-08-16 NOTE — Patient Instructions (Signed)
Visit Information   Goals Addressed             This Visit's Progress    Learn More About My Health   On track    Timeframe:  Long-Range Goal Priority:  High Start Date:    04/09/2020                         Expected End Date:   04/09/2021                     Follow Up Date 10/13/2020    - ask questions - repeat what I heard to make sure I understand - bring a list of my medicines to the visit - speak up when I don't understand    Why is this important?   The best way to learn about your health and care is by talking to the doctor and nurse.  They will answer your questions and give you information in the way that you like best.    Notes:       Lifestyle Change-Hypertension   On track    Timeframe:  Long-Range Goal Priority:  High Start Date:            04/09/2020                 Expected End Date:  04/09/2021                     Follow Up Date 10/13/2020    - ask questions to understand    Why is this important?   The changes that you are asked to make may be hard to do.  This is especially true when the changes are life-long.  Knowing why it is important to you is the first step.  Working on the change with your family or support person helps you not feel alone.  Reward yourself and family or support person when goals are met. This can be an activity you choose like bowling, hiking, biking, swimming or shooting hoops.     Notes:       Manage My Medicine   On track    Timeframe:  Long-Range Goal Priority:  High Start Date:        04/09/2020                     Expected End Date:   04/09/2021                    Follow Up Date 10/13/2020    - call for medicine refill 2 or 3 days before it runs out - keep a list of all the medicines I take; vitamins and herbals too - use a pillbox to sort medicine    Why is this important?   These steps will help you keep on track with your medicines.   Notes:         Patient Care Plan: General Pharmacy (Adult)      Problem Identified: constipation, htn, hld   Priority: High  Onset Date: 04/09/2020     Long-Range Goal: disease management   Start Date: 04/09/2020  Expected End Date: 04/13/2021  Recent Progress: On track  Priority: High  Note:   Current Barriers:  Unable to achieve control of constipation   Pharmacist Clinical Goal(s):  Over the next 90 days, patient will achieve adherence to monitoring guidelines and medication adherence to  achieve therapeutic efficacy adhere to prescribed medication regimen as evidenced by fill history and pill box usage through collaboration with PharmD and provider.    Interventions: 1:1 collaboration with [name], Elnita Maxwell, MD regarding development and update of comprehensive plan of care as evidenced by provider attestation and co-signature Inter-disciplinary care team collaboration (see longitudinal plan of care) Comprehensive medication review performed; medication list updated in electronic medical record  Hypertension (BP goal <140/90) -controlled -Current treatment: losartan 25 mg daily  -Medications previously tried: carvedilol, clonidine, edarbi, lisinopril, metoprolol, bystolic -Current home readings: states "good" -Current dietary habits: eatings vegetables and fruit -Current exercise habits: stays active but too weak to walk like she did previously -Denies hypotensive/hypertensive symptoms -Educated on BP goals and benefits of medications for prevention of heart attack, stroke and kidney damage; Daily salt intake goal < 2300 mg; Exercise goal of 150 minutes per week; -Counseled to monitor BP at home daily, document, and provide log at future appointments -Counseled on diet and exercise extensively Recommended to continue current medication  Hyperlipidemia: (LDL goal < 55) -uncontrolled -Current treatment: simvastatin 20 mg daily bedtime  -Medications previously tried: none reported  -Current dietary patterns: vegetables and fruit - can't  tolerate meat well -Current exercise habits: stays active but not walking like before -Educated on Cholesterol goals;  Benefits of statin for ASCVD risk reduction; Importance of limiting foods high in cholesterol; Exercise goal of 150 minutes per week; -Counseled on diet and exercise extensively Recommended to continue current medication  Constipation (Goal: reduce stomach discomfort) -uncontrolled -Current treatment  miralax daily  Stool softener daily  Senna plus daily prn  -Medications previously tried: lactulose  -Counseled on diet and exercise extensively Counseled on fiber, hydration and activity to help with constipation.  Recommended adding ensure to diet for protein/nutrients since limited intake in diet wiht recent stomach flare. Discussed followin up with GI specialist to evaluate stomach pain and constipation again. Patient unable to tolerate lactulose due to sweet taste.    Patient Goals/Self-Care Activities Over the next 90 days, patient will:  - take medications as prescribed focus on medication adherence by using pill box Coordinating inhaler from patient assistance.   Follow Up Plan: Telephone follow up appointment with care management team member scheduled for:09/2020      The patient verbalized understanding of instructions, educational materials, and care plan provided today and declined offer to receive copy of patient instructions, educational materials, and care plan.  Telephone follow up appointment with pharmacy team member scheduled for: 09/2020  Burnice Logan, [facility]

## 2020-09-08 ENCOUNTER — Other Ambulatory Visit: Payer: Self-pay

## 2020-09-08 ENCOUNTER — Telehealth: Payer: Self-pay

## 2020-09-08 ENCOUNTER — Ambulatory Visit (INDEPENDENT_AMBULATORY_CARE_PROVIDER_SITE_OTHER): Payer: Medicare Other | Admitting: Allergy and Immunology

## 2020-09-08 ENCOUNTER — Encounter: Payer: Self-pay | Admitting: Allergy and Immunology

## 2020-09-08 VITALS — BP 160/70 | HR 72 | Resp 20 | Ht 63.0 in | Wt 122.6 lb

## 2020-09-08 DIAGNOSIS — K219 Gastro-esophageal reflux disease without esophagitis: Secondary | ICD-10-CM | POA: Diagnosis not present

## 2020-09-08 DIAGNOSIS — I25810 Atherosclerosis of coronary artery bypass graft(s) without angina pectoris: Secondary | ICD-10-CM | POA: Diagnosis not present

## 2020-09-08 DIAGNOSIS — R0609 Other forms of dyspnea: Secondary | ICD-10-CM

## 2020-09-08 DIAGNOSIS — T17308D Unspecified foreign body in larynx causing other injury, subsequent encounter: Secondary | ICD-10-CM

## 2020-09-08 DIAGNOSIS — K224 Dyskinesia of esophagus: Secondary | ICD-10-CM

## 2020-09-08 DIAGNOSIS — R0600 Dyspnea, unspecified: Secondary | ICD-10-CM

## 2020-09-08 MED ORDER — FAMOTIDINE 40 MG PO TABS: ORAL_TABLET | ORAL | 5 refills | Status: DC

## 2020-09-08 NOTE — Patient Instructions (Addendum)
  1.  Allergen avoidance measures?  2.  Evaluation of throat with ENT for vocal cord dysfunction  3.  Full PFT w/ lung volumes/DLCO  4.  Treat and prevent reflux/LPR:  A. Decrease caffeine consumption B. Pantoprazole 40 mg - 1 tablet 2 times per day C. Famotidine 40 mg - 1 tablet 1 time per day in evening  5. Return to clinic in 2 weeks or earlier if problem

## 2020-09-08 NOTE — Progress Notes (Signed)
[place]   Dear Dr. Sedalia Mutaox,  Thank you for referring Leah Pittman to the [facility] [place] on 09/08/2020.   Below is a summation of this patient's evaluation and recommendations.  Thank you for your referral. I will keep you informed about this patient's response to treatment.   If you have any questions please do not hesitate to contact me.   Sincerely,  Jessica PriestEric J. Hayven Croy, MD Allergy / Immunology [facility] [place]   ______________________________________________________________________    NEW PATIENT NOTE  Referring Provider: Blane Oharaox, [name], MD Primary Provider: Blane Oharaox, [name], MD Date of office visit: 09/08/2020    Subjective:   Chief Complaint:  Leah Pittman (DOB: 1931-01-16) is a 87 y.o. female who presents to the clinic on 09/08/2020 with a chief complaint of Breathing Problem and Shortness of Breath .     HPI: [name] presents to this clinic in evaluation of respiratory tract problems.  She has been having an issue with shortness of breath especially when she exerts herself and she has undergone evaluation with pulmonology and cardiology and has had a large collection of diagnostic test performed for this issue all of which have not identified the specific cause of her dyspnea on exertion.  She has tried various medications including a triple inhaler and a rescue inhaler which have not really helped her at all.  These issues have developed over the course of the past several years but they have been progressive recently.  There is not really an obvious trigger giving rise to this issue.  She has spells of cough and she brings up thick phlegm that is clear.  She gets phlegm in her throat and she chokes.  In addition, she has been losing her voice over the course of the past 2 years.  This appeared to occur after she had bypass surgery performed.  She has  lots of throat clearing and feels as though there is a coating in her throat.  She does not have any posttussive vomiting or urination although she does complain of having a "irritated throat" all the time.  She has a persistent abdominal issue with bloating and pain in her right upper quadrant that sometimes migrates across her chest.  She has been having problems with swallowing and choking and she can only eat soft foods at this point in time.  She has had an upper endoscopy performed about 2 years ago and apparently had an upper GI study performed as well around that point in time and recently had a modified barium swallow.  She has minimal nasal symptoms.  She did have nasal symptoms as a young adult but for the most part that has resolved.  Past Medical History:  Diagnosis Date   Anxiety    Arthritis    Chest pain 2011   CARDIOLITE - no significant symptoms, EKG changes, or arrhythmias   Congenital prolapse of bladder mucosa    Diverticulosis    Dyspnea 02/16/2012   2D ECHO - EF 55-60%, normal   Esophageal dysphagia    Fatigue    GERD (gastroesophageal reflux disease)    Glaucoma    Headache(784.0)    Heart attack (HCC)    Hiatal hernia    High blood pressure 02/16/2012   RENAL DOPPLER - normal   Hyperlipidemia    Hypothyroidism    Labile hypertension    Lightheadedness  Migraine headache    Multiple allergies    Myalgia    OSA (obstructive sleep apnea)    Pain, lower leg    Calf   Problem of menstruation    Proteinuria    Reflux    SOB (shortness of breath)    Thyroid disease    Urinary problem    Valvular heart disease    Vitamin B12 deficiency    Vitamin D deficiency     Past Surgical History:  Procedure Laterality Date   ABDOMINAL HYSTERECTOMY  1976   CHOLECYSTECTOMY  2000   COLONOSCOPY  07/27/2014   Moderate predominantly sigmoid divertuculosis. Otherwise normal colonoscopy   CORONARY ARTERY BYPASS GRAFT N/A 07/10/2018   Procedure: CORONARY ARTERY  BYPASS GRAFTING (CABG), FREE LIMA;  Surgeon: [name], MD;  Location: [facility] OR;  Service: Open Heart Surgery;  Laterality: N/A;   CYST REMOVAL NECK     CYSTECTOMY  1974   Intestine   CYSTECTOMY  1996   Brain stem   ESOPHAGOGASTRODUODENOSCOPY  12/07/2016   Schatzki's ring status post esophageal dilatation. Small hiatal hernia. Mild gastritis   EYE SURGERY  2003   EYE SURGERY  07/2016   LEFT HEART CATH AND CORONARY ANGIOGRAPHY N/A 07/08/2018   Procedure: LEFT HEART CATH AND CORONARY ANGIOGRAPHY;  Surgeon: Runell Gess, MD;  Location: [facility] INVASIVE CV LAB;  Service: Cardiovascular;  Laterality: N/A;   LEFT HEART CATH AND CORONARY ANGIOGRAPHY N/A 07/25/2019   Procedure: LEFT HEART CATH AND CORONARY ANGIOGRAPHY;  Surgeon: Kathleene Hazel, MD;  Location: [facility] INVASIVE CV LAB;  Service: Cardiovascular;  Laterality: N/A;   MOUTH SURGERY  2013   RADIOLOGY WITH ANESTHESIA N/A 11/05/2019   Procedure: MRI WITH ANESTHESIA;  Surgeon: Radiologist, Medication, MD;  Location: [facility] OR;  Service: Radiology;  Laterality: N/A;   TEE WITHOUT CARDIOVERSION N/A 07/10/2018   Procedure: TRANSESOPHAGEAL ECHOCARDIOGRAM (TEE);  Surgeon: [name], MD;  Location: [facility];  Service: Open Heart Surgery;  Laterality: N/A;   TONSILLECTOMY     age 64    Allergies as of 09/08/2020       Reactions   Benadryl [diphenhydramine] Swelling, Other (See Comments)   Tongue swells and hallucinates- could not talk   Lipase Concentrate-hp [digestive Enzymes] Rash   Zenpep [pancrelipase (lip-prot-amyl)] Rash   Codeine Nausea And Vomiting, Hypertension   Meperidine Nausea Only, Swelling, Other (See Comments)   Tongue swells and "I feel like death"   Other Other (See Comments)   Tears the skin   Prednisone    Hypertension    Promethazine Other (See Comments)   Hypotension   Propranolol Nausea And Vomiting   Shellfish Allergy Nausea And Vomiting   Tape Other (See Comments)   Tears the skin   Tazarotene Other (See  Comments)   Reaction not recalled   Iodinated Diagnostic Agents Nausea Only, Rash        Medication List    aerochamber Z-Stat Plus/medium inhaler Use as instructed   albuterol 108 (90 Base) MCG/ACT inhaler Commonly known as: VENTOLIN HFA Inhale 2 puffs into the lungs every 4 (four) hours as needed for wheezing or shortness of breath.   aspirin 81 MG tablet Take 81 mg by mouth daily.   Breztri Aerosphere 160-9-4.8 MCG/ACT Aero Generic drug: Budeson-Glycopyrrol-Formoterol Inhale 2 puffs into the lungs in the morning and at bedtime.   CALCIUM CITRATE+D3 PETITES PO Take 1 tablet by mouth daily.   dorzolamide-timolol 22.3-6.8 MG/ML ophthalmic solution Commonly known as: COSOPT Place  1 drop into the left eye 2 (two) times daily.   famotidine 40 MG tablet Commonly known as: PEPCID Take one tablet by mouth once daily at night. Started by: [name], MD   levothyroxine 100 MCG tablet Commonly known as: SYNTHROID Take 1 tablet (100 mcg total) by mouth daily before breakfast.   losartan 25 MG tablet Commonly known as: COZAAR Take 1 tablet (25 mg total) by mouth daily.   pantoprazole 40 MG tablet Commonly known as: PROTONIX Take 1 tablet (40 mg total) by mouth 2 (two) times daily. What changed: when to take this   simvastatin 20 MG tablet Commonly known as: ZOCOR TAKE 1 TABLET BY MOUTH EVERYDAY AT BEDTIME What changed: See the new instructions.   vitamin D3 50 MCG (2000 UT) Caps Take 2,000 Units by mouth daily.   Xelpros 0.005 % Emul Generic drug: Latanoprost Place 1 drop into the left eye at bedtime.        Review of systems negative except as noted in HPI / PMHx or noted below:  Review of Systems  Constitutional: Negative.   HENT: Negative.    Eyes: Negative.   Respiratory: Negative.    Cardiovascular: Negative.   Gastrointestinal: Negative.   Genitourinary: Negative.   Musculoskeletal: Negative.   Skin: Negative.   Neurological: Negative.    Endo/Heme/Allergies: Negative.   Psychiatric/Behavioral: Negative.     Family History  Problem Relation Age of Onset   Heart attack Father    Stroke Father    Cancer Sister    Cancer Brother    Asthma Brother    Cancer Brother     Social History   Socioeconomic History   Marital status: Married    Spouse name: Not on file   Number of children: 3   Years of education: college   Highest education level: Not on file  Occupational History   Occupation: Retired  Tobacco Use   Smoking status: Never   Smokeless tobacco: Never  Building services engineer Use: Never used  Substance and Sexual Activity   Alcohol use: No   Drug use: No   Sexual activity: Not on file  Other Topics Concern   Not on file  Social History Narrative   Lives with husband in [place], [place]    Environmental and Social history  Lives in a house with a dry environment, no animals look inside the household, hardwood in the bedroom, plastic on the bed, no plastic on the pillow, no smoking ongoing with inside the household.  Objective:   Vitals:   09/08/20 1214 09/08/20 1215  BP: (!) 172/88 (!) 160/70  Pulse:    Resp:    SpO2:     Height: 5\' 3"  (160 cm) Weight: 122 lb 9.6 oz (55.6 kg)  Physical Exam Constitutional:      Appearance: She is not diaphoretic.     Comments: Extremely raspy voice with throat clearing and slight throat clearing like cough  HENT:     Head: Normocephalic.     Right Ear: Tympanic membrane, ear canal and external ear normal.     Left Ear: Tympanic membrane, ear canal and external ear normal.     Nose: Nose normal. No mucosal edema or rhinorrhea.     Mouth/Throat:     Pharynx: Uvula midline. No oropharyngeal exudate.  Eyes:     Conjunctiva/sclera: Conjunctivae normal.  Neck:     Thyroid: No thyromegaly.     Trachea: Trachea normal. No tracheal tenderness or  tracheal deviation.  Cardiovascular:     Rate and Rhythm: Normal rate and regular rhythm.     Heart sounds: Normal  heart sounds, S1 normal and S2 normal. No murmur heard. Pulmonary:     Effort: No respiratory distress.     Breath sounds: Normal breath sounds. No stridor. No wheezing or rales.  Lymphadenopathy:     Head:     Right side of head: No tonsillar adenopathy.     Left side of head: No tonsillar adenopathy.     Cervical: No cervical adenopathy.  Skin:    Findings: No erythema or rash.     Nails: There is no clubbing.  Neurological:     Mental Status: She is alert.    Diagnostics: Allergy skin tests were performed.  She did not demonstrate any hypersensitivity against a screening panel of aeroallergens.  Oxygen saturation on room air at rest was 99%.  Oxygen saturation while walking the hallway on room air was 98%.  Spirometry was performed and demonstrated an FEV1 of 1.04 @ 61 % of predicted. FEV1/FVC = 0.76.  Following the administration of nebulized albuterol her FEV1 increased to 1.14 which was a calculated increase of approximately 10%.  The patient had an Asthma Control Test with the following results:  .    Results of a chest CT scan obtained 19 May 2020 identifies the following:  Cardiovascular: Status post coronary bypass graft. Atherosclerosis of thoracic aorta is noted without aneurysm formation. Normal cardiac size. No pericardial effusion.   Mediastinum/Nodes: Small sliding-type hiatal hernia is noted. Thyroid gland is unremarkable. Calcified right hilar lymph nodes are noted consistent with prior granulomatous disease.   Lungs/Pleura: No pneumothorax or pleural effusion is noted. Mild biapical scarring is noted. Mild bronchiectasis is noted in both lower lobes. Stable small scarring is noted posteriorly in the right lower lobe. Stable probable scarring is seen in the right lower lobe in inferior portion of right middle lobe. No definite acute abnormality is noted in the lungs.  Results of an echocardiogram obtained 15 Jul 2020 identifies the following:   1. Left  ventricular ejection fraction, by estimation, is 60 to 65%. The  left ventricle has normal function. The left ventricle has no regional  wall motion abnormalities. Left ventricular diastolic parameters are  consistent with Grade I diastolic  dysfunction (impaired relaxation).   2. Right ventricular systolic function is normal. The right ventricular  size is normal. There is normal pulmonary artery systolic pressure. The  estimated right ventricular systolic pressure is 20.3 mmHg.   3. The mitral valve is grossly normal. Trivial mitral valve  regurgitation. No evidence of mitral stenosis.   4. The aortic valve is tricuspid. Aortic valve regurgitation is not  visualized. No aortic stenosis is present.   5. The inferior vena cava is normal in size with greater than 50%  respiratory variability, suggesting right atrial pressure of 3 mmHg.  Results of blood tests obtained 16 Jul 2020 identifies WBC 8.5, hemoglobin 12.1, platelet 242, TSH 2.291 IU/mL, creatinine 0.69 mg/DL  Results of a modified barium swallow obtained 16 Jul 2020 identified the following:  Pt presents with functional oropharyngeal swallow with NO observed laryngeal penetration nor aspiration, despite coughing occasionally throughout study.  Oral phase was normal. There was mild, trace residue in the pharynx post-swallow, attributable to potential weakness but not concerning. Occasional scanning of the esophagus revealed some retention of solid barium, cleared with liquid wash and not inconsistent with findings from UGI in December of  2021.  Dysphagia-related aspiration is unlikely.  There is potential for nocturnal aspiration of esophageal stasis vs reflux.  Results of MRI of brain obtained 14 Jul 2020 identified the following:  Sinuses/Orbits: Visualized orbits show no acute finding. Bilateral lens replacements. Trace bilateral ethmoid and right maxillary sinus mucosal thickening. Frothy secretions within the left  sphenoid sinus.  Results of CT scan of brain obtained 14 Jul 2020 identified the following:  Sinuses/Orbits: No acute abnormality.  Other: Mastoid air cells are clear.  Results of blood tests obtained 16 Jul 2020 identified WBC 8.5, hemoglobin 12.1, platelet 242.  Assessment and Plan:    1. Dyspnea on exertion   2. Choking, subsequent encounter   3. LPRD (laryngopharyngeal reflux disease)   4. Esophageal dysfunction     1.  Allergen avoidance measures?  2.  Evaluation of throat with ENT for vocal cord dysfunction  3.  Full PFT w/ lung volumes/DLCO  4.  Treat and prevent reflux/LPR:  A. Decrease caffeine consumption B. Pantoprazole 40 mg - 1 tablet 2 times per day C. Famotidine 40 mg - 1 tablet 1 time per day in evening  5. Return to clinic in 2 weeks or earlier if problem  Jillann has some form of respiratory defect that is giving rise to significant dyspnea on exertion and very significant inflammation and irritation of her mid airway.  I think we need to visualize her vocal cords and we will get that arranged at some point this week and also obtain full pulmonary function tests to help define any specific defect contained within her pulmonary system.  I am going to treat her for LPR with the therapy noted above.  I will see her back in this clinic in 2 weeks or earlier if there is a problem.  [name], MD Allergy / Immunology [facility]

## 2020-09-08 NOTE — Telephone Encounter (Signed)
Please refer patient to [facility], either Dr. Jenne Pane or Dr. Annalee Genta, per Dr. Lucie Leather for vocal cord dysfunction.  Thank you for your help!

## 2020-09-09 NOTE — Telephone Encounter (Signed)
Referral has been typed up and printed. Once Dr Lucie Leather finishes his note, I will fax the referral to their office for review & contact the patient.  Referral placed on Bailey's desk.  Thanks

## 2020-09-13 ENCOUNTER — Encounter: Payer: Self-pay | Admitting: Allergy and Immunology

## 2020-09-13 NOTE — Telephone Encounter (Addendum)
Notes have been completed and referral, notes and demographics have been faxed to [facility], [phone]. Patient has been informed of all this information.  [facility] [address]; [address] [place] [address] [phone].

## 2020-09-21 ENCOUNTER — Ambulatory Visit (INDEPENDENT_AMBULATORY_CARE_PROVIDER_SITE_OTHER): Payer: Medicare Other | Admitting: Family Medicine

## 2020-09-21 ENCOUNTER — Other Ambulatory Visit: Payer: Self-pay

## 2020-09-21 VITALS — BP 134/72 | HR 72 | Temp 96.6°F | Resp 16 | Ht 63.0 in | Wt 122.0 lb

## 2020-09-21 DIAGNOSIS — E782 Mixed hyperlipidemia: Secondary | ICD-10-CM | POA: Diagnosis not present

## 2020-09-21 DIAGNOSIS — E038 Other specified hypothyroidism: Secondary | ICD-10-CM | POA: Diagnosis not present

## 2020-09-21 DIAGNOSIS — I119 Hypertensive heart disease without heart failure: Secondary | ICD-10-CM | POA: Diagnosis not present

## 2020-09-21 DIAGNOSIS — I25110 Atherosclerotic heart disease of native coronary artery with unstable angina pectoris: Secondary | ICD-10-CM | POA: Diagnosis not present

## 2020-09-21 NOTE — Progress Notes (Signed)
Subjective:  Patient ID: Leah Pittman, female    DOB: [birth year]  Age: 87 y.o. MRN: [id number]  Chief Complaint  Patient presents with   COPD   Gastroesophageal Reflux   Hypertension    HPI Hypertensive heart disease: Pt sees Dr. Odetta Pink.  ON aspirin 81 mg once daily.  Hyperlipidemia: on simvastatin. Hypothyroidism: on synthroid 100 mcg once daily.  Pt saw Dr. Lucie Leather, who performed allergy testing, which per pt was all normal. He gave her albuterol HFA in the office and it made her jittery. He referred her to [facility] for a PFT. Referred her to ENT in [facility] for vocal cord dysfunction. .  Pt saw Dr. Judeth Horn, pulmonology. Per patient he felt she was no longer wheezing. He recommended she come up for breathing therapy.   Current Outpatient Medications on File Prior to Visit  Medication Sig Dispense Refill   aspirin 81 MG tablet Take 81 mg by mouth daily.     Budeson-Glycopyrrol-Formoterol (BREZTRI AEROSPHERE) 160-9-4.8 MCG/ACT AERO Inhale 2 puffs into the lungs in the morning and at bedtime.     Calcium Citrate-Vitamin D (CALCIUM CITRATE+D3 PETITES PO) Take 1 tablet by mouth daily.     dorzolamide-timolol (COSOPT) 22.3-6.8 MG/ML ophthalmic solution Place 1 drop into the left eye 2 (two) times daily.      famotidine (PEPCID) 40 MG tablet Take one tablet by mouth once daily at night. 30 tablet 5   losartan (COZAAR) 100 MG tablet Take 50 mg by mouth daily.     pantoprazole (PROTONIX) 40 MG tablet Take 1 tablet (40 mg total) by mouth 2 (two) times daily. (Patient taking differently: Take 40 mg by mouth daily.) 180 tablet 1   XELPROS 0.005 % EMUL Place 1 drop into the left eye at bedtime.     albuterol (VENTOLIN HFA) 108 (90 Base) MCG/ACT inhaler Inhale 2 puffs into the lungs every 4 (four) hours as needed for wheezing or shortness of breath. 18 g 0   Spacer/Aero-Holding Chambers (AEROCHAMBER Z-STAT PLUS/MEDIUM) inhaler Use as instructed 1 each 2   No current facility-administered  medications on file prior to visit.   Past Medical History:  Diagnosis Date   Anxiety    Arthritis    Chest pain 2011   CARDIOLITE - no significant symptoms, EKG changes, or arrhythmias   Congenital prolapse of bladder mucosa    Diverticulosis    Dyspnea 02/16/2012   2D ECHO - EF 55-60%, normal   Esophageal dysphagia    Fatigue    GERD (gastroesophageal reflux disease)    Glaucoma    Headache(784.0)    Heart attack (HCC)    Hiatal hernia    High blood pressure 02/16/2012   RENAL DOPPLER - normal   Hyperlipidemia    Hypothyroidism    Labile hypertension    Lightheadedness    Migraine headache    Multiple allergies    Myalgia    OSA (obstructive sleep apnea)    Pain, lower leg    Calf   Problem of menstruation    Proteinuria    Reflux    SOB (shortness of breath)    Thyroid disease    Urinary problem    Valvular heart disease    Vitamin B12 deficiency    Vitamin D deficiency    Past Surgical History:  Procedure Laterality Date   ABDOMINAL HYSTERECTOMY  1976   CHOLECYSTECTOMY  2000   COLONOSCOPY  07/27/2014   Moderate predominantly sigmoid divertuculosis. Otherwise normal colonoscopy  CORONARY ARTERY BYPASS GRAFT N/A 07/10/2018   Procedure: CORONARY ARTERY BYPASS GRAFTING (CABG), FREE LIMA;  Surgeon: [name], MD;  [facility];  Service: Open Heart Surgery;  Laterality: N/A;   CYST REMOVAL NECK     CYSTECTOMY  1974   Intestine   CYSTECTOMY  1996   Brain stem   ESOPHAGOGASTRODUODENOSCOPY  12/07/2016   Schatzki's ring status post esophageal dilatation. Small hiatal hernia. Mild gastritis   EYE SURGERY  2003   EYE SURGERY  07/2016   LEFT HEART CATH AND CORONARY ANGIOGRAPHY N/A 07/08/2018   Procedure: LEFT HEART CATH AND CORONARY ANGIOGRAPHY;  Surgeon: Runell Gess, MD;  [facility] INVASIVE CV LAB;  Service: Cardiovascular;  Laterality: N/A;   LEFT HEART CATH AND CORONARY ANGIOGRAPHY N/A 07/25/2019   Procedure: LEFT HEART CATH AND CORONARY  ANGIOGRAPHY;  Surgeon: Kathleene Hazel, MD;  [facility] INVAS[facility] CV LAB;  Service: Cardiovascular;  Laterality: N/A;   MOUTH SURGERY  2013   RADIOLOGY WITH ANESTHESIA N/A 11/05/2019   Procedure: MRI WITH ANESTHESIA;  Surgeon: Radiologist, Medication, MD;  Location: [facility];  Service: Radiology;  Laterality: N/A;   TEE WITHOUT CARDIOVERSION N/A 07/10/2018   Procedure: TRANSESOPHAGEAL ECHOCARDIOGRAM (TEE);  Surgeon: [name], MD;  Location: [facility] OR;  Service: Open Heart Surgery;  Laterality: N/A;   TONSILLECTOMY     age 87    Family History  Problem Relation Age of Onset   Heart attack Father    Stroke Father    Cancer Sister    Cancer Brother    Asthma Brother    Cancer Brother    Social History   Socioeconomic History   Marital status: Married    Spouse name: Not on file   Number of children: 3   Years of education: college   Highest education level: Not on file  Occupational History   Occupation: Retired  Tobacco Use   Smoking status: Never   Smokeless tobacco: Never  Building services engineer Use: Never used  Substance and Sexual Activity   Alcohol use: No   Drug use: No   Sexual activity: Not on file  Other Topics Concern   Not on file  Social History Narrative   Lives with husband in [place], [place]   Social Determinants of Health   Financial Resource Strain: Not on file  Food Insecurity: No Food Insecurity   Worried About Programme researcher, broadcasting/film/video in the Last Year: Never true   Ran Out of Food in the Last Year: Never true  Transportation Needs: Not on file  Physical Activity: Not on file  Stress: Not on file  Social Connections: Not on file    Review of Systems  Constitutional:  Positive for fatigue. Negative for chills and fever.  HENT:  Positive for postnasal drip. Negative for congestion, rhinorrhea and sore throat.   Respiratory:  Positive for cough and shortness of breath.   Cardiovascular:  Positive for chest pain. Negative for leg swelling.   Gastrointestinal:  Positive for constipation (controlled with miralax). Negative for abdominal pain, diarrhea, nausea and vomiting.       Abdominal bloating  Genitourinary:  Positive for frequency. Negative for dysuria and urgency.  Musculoskeletal:  Positive for back pain. Negative for arthralgias and myalgias.  Neurological:  Positive for weakness. Negative for dizziness, light-headedness and headaches.       Balance issues  Psychiatric/Behavioral:  Negative for dysphoric mood. The patient is not nervous/anxious.  Objective:  BP 134/72   Pulse 72   Temp (!) 96.6 F (35.9 C)   Resp 16   Ht 5\' 3"  (1.6 m)   Wt 122 lb (55.3 kg)   BMI 21.61 kg/m   BP/Weight 09/29/2020 09/21/2020 09/08/2020  Systolic BP 114 134 160  Diastolic BP 58 72 70  Wt. (Lbs) - 122 122.6  BMI - 21.61 21.72    Physical Exam Vitals reviewed.  Constitutional:      Appearance: Normal appearance. She is normal weight.  Neck:     Vascular: No carotid bruit.  Cardiovascular:     Rate and Rhythm: Normal rate and regular rhythm.     Heart sounds: Normal heart sounds.  Pulmonary:     Effort: Pulmonary effort is normal. No respiratory distress.     Breath sounds: Normal breath sounds.  Abdominal:     General: Abdomen is flat. Bowel sounds are normal.     Palpations: Abdomen is soft.     Tenderness: There is abdominal tenderness (mild generalized).  Neurological:     Mental Status: She is alert and oriented to person, place, and time.  Psychiatric:        Mood and Affect: Mood normal.        Behavior: Behavior normal.    Diabetic Foot Exam - Simple   No data filed      Lab Results  Component Value Date   WBC 7.5 09/23/2020   HGB 12.3 09/23/2020   HCT 36.7 09/23/2020   PLT 247 09/23/2020   GLUCOSE 95 09/23/2020   CHOL 181 09/23/2020   TRIG 99 09/23/2020   HDL 67 09/23/2020   LDLCALC 96 09/23/2020   ALT 13 09/23/2020   AST 14 09/23/2020   NA 138 09/23/2020   K 4.5 09/23/2020   CL 103  09/23/2020   CREATININE 0.88 09/23/2020   BUN 16 09/23/2020   CO2 23 09/23/2020   TSH 2.291 07/15/2020   INR 0.9 07/14/2020   HGBA1C 5.8 (H) 07/10/2018      Assessment & Plan:   1. Coronary artery disease involving native coronary artery of native heart with unstable angina pectoris [facility]) The current medical regimen is effective;  continue present plan and medications. Management per specialist.   2. Mixed hyperlipidemia Well controlled.  No changes to medicines.  Continue to work on eating a healthy diet and exercise.  - CBC with Differential/Platelet; Future - Comprehensive metabolic panel; Future - Lipid panel; Future  3. Hypertensive heart disease without heart failure Well controlled.  No changes to medicines.  Continue to work on eating a healthy diet and exercise.   4. Secondary hypothyroidism  The current medical regimen is effective;  continue present plan and medications.  Orders Placed This Encounter  Procedures   CBC with Differential/Platelet   Comprehensive metabolic panel   Lipid panel     Follow-up: Return in about 4 months (around 01/21/2021) for fasting.  An After Visit Summary was printed and given to the patient.  14/03/2020, MD [facility] [phone]

## 2020-09-23 ENCOUNTER — Other Ambulatory Visit: Payer: Medicare Other

## 2020-09-23 ENCOUNTER — Other Ambulatory Visit: Payer: Self-pay

## 2020-09-23 DIAGNOSIS — E782 Mixed hyperlipidemia: Secondary | ICD-10-CM

## 2020-09-24 LAB — LIPID PANEL
Chol/HDL Ratio: 2.7 ratio (ref 0.0–4.4)
Cholesterol, Total: 181 mg/dL (ref 100–199)
HDL: 67 mg/dL (ref >39)
LDL Chol Calc (NIH): 96 mg/dL (ref 0–99)
Triglycerides: 99 mg/dL (ref 0–149)
VLDL Cholesterol Cal: 18 mg/dL (ref 5–40)

## 2020-09-24 LAB — COMPREHENSIVE METABOLIC PANEL
ALT: 13 IU/L (ref 0–32)
AST: 14 IU/L (ref 0–40)
Albumin/Globulin Ratio: 2 (ref 1.2–2.2)
Albumin: 4.3 g/dL (ref 3.6–4.6)
Alkaline Phosphatase: 67 IU/L (ref 44–121)
BUN/Creatinine Ratio: 18 (ref 12–28)
BUN: 16 mg/dL (ref 8–27)
Bilirubin Total: 0.3 mg/dL (ref 0.0–1.2)
CO2: 23 mmol/L (ref 20–29)
Calcium: 9.9 mg/dL (ref 8.7–10.3)
Chloride: 103 mmol/L (ref 96–106)
Creatinine, Ser: 0.88 mg/dL (ref 0.57–1.00)
Globulin, Total: 2.1 g/dL (ref 1.5–4.5)
Glucose: 95 mg/dL (ref 65–99)
Potassium: 4.5 mmol/L (ref 3.5–5.2)
Sodium: 138 mmol/L (ref 134–144)
Total Protein: 6.4 g/dL (ref 6.0–8.5)
eGFR: 64 mL/min/{1.73_m2} (ref >59)

## 2020-09-24 LAB — CBC WITH DIFFERENTIAL/PLATELET
Basophils Absolute: 0.1 10*3/uL (ref 0.0–0.2)
Basos: 2 %
EOS (ABSOLUTE): 0.4 10*3/uL (ref 0.0–0.4)
Eos: 6 %
Hematocrit: 36.7 % (ref 34.0–46.6)
Hemoglobin: 12.3 g/dL (ref 11.1–15.9)
Immature Grans (Abs): 0 10*3/uL (ref 0.0–0.1)
Immature Granulocytes: 1 %
Lymphocytes Absolute: 1.7 10*3/uL (ref 0.7–3.1)
Lymphs: 23 %
MCH: 31.9 pg (ref 26.6–33.0)
MCHC: 33.5 g/dL (ref 31.5–35.7)
MCV: 95 fL (ref 79–97)
Monocytes Absolute: 0.9 10*3/uL (ref 0.1–0.9)
Monocytes: 13 %
Neutrophils Absolute: 4.2 10*3/uL (ref 1.4–7.0)
Neutrophils: 55 %
Platelets: 247 10*3/uL (ref 150–450)
RBC: 3.85 x10E6/uL (ref 3.77–5.28)
RDW: 14.2 % (ref 11.7–15.4)
WBC: 7.5 10*3/uL (ref 3.4–10.8)

## 2020-09-24 LAB — CARDIOVASCULAR RISK ASSESSMENT

## 2020-09-25 ENCOUNTER — Other Ambulatory Visit: Payer: Self-pay | Admitting: Family Medicine

## 2020-09-27 ENCOUNTER — Other Ambulatory Visit: Payer: Self-pay

## 2020-09-29 ENCOUNTER — Other Ambulatory Visit: Payer: Self-pay

## 2020-09-29 ENCOUNTER — Ambulatory Visit (INDEPENDENT_AMBULATORY_CARE_PROVIDER_SITE_OTHER): Payer: Medicare Other | Admitting: Allergy and Immunology

## 2020-09-29 VITALS — BP 114/58 | HR 72 | Temp 98.1°F | Resp 22

## 2020-09-29 DIAGNOSIS — I25810 Atherosclerosis of coronary artery bypass graft(s) without angina pectoris: Secondary | ICD-10-CM

## 2020-09-29 DIAGNOSIS — K224 Dyskinesia of esophagus: Secondary | ICD-10-CM

## 2020-09-29 DIAGNOSIS — K219 Gastro-esophageal reflux disease without esophagitis: Secondary | ICD-10-CM

## 2020-09-29 DIAGNOSIS — R0600 Dyspnea, unspecified: Secondary | ICD-10-CM

## 2020-09-29 DIAGNOSIS — M6281 Muscle weakness (generalized): Secondary | ICD-10-CM

## 2020-09-29 DIAGNOSIS — R252 Cramp and spasm: Secondary | ICD-10-CM

## 2020-09-29 DIAGNOSIS — R0609 Other forms of dyspnea: Secondary | ICD-10-CM

## 2020-09-29 NOTE — Patient Instructions (Signed)
  1.  Evaluation with neurologist for muscle weakness and cramping  2.  Continue with evaluation of throat with ENT for vocal cord dysfunction  3.  Continue with full PFT w/ lung volumes/DLCO  4.  Continue to treat and prevent reflux/LPR:  A. Decrease caffeine consumption B. Pantoprazole 40 mg - 1 tablet 2 times per day C. Famotidine 40 mg - 1 tablet 1 time per day in evening  5. Return to clinic in 4 weeks or earlier if problem

## 2020-09-29 NOTE — Progress Notes (Signed)
[place] - [place] - [place] - [place] - [place]   Follow-up Note  Referring Provider: Blane Ohara, MD Primary Provider: Blane Ohara, MD Date of Office Visit: 09/29/2020  Subjective:   Leah Pittman (DOB: 09/24/1930) is a 87 y.o. female who [facility] in re-evaluation of the following:  HPI: Javia returns to this clinic in evaluation of LPR, suspected microaspiration, dyspnea on exertion, esophageal dysmotility, and muscle weakness.  I last saw her in this clinic during her initial evaluation of 08 September 2020.  During her initial evaluation we had her treat LPR more aggressively and this has actually helped her.  She has much less cough and she does not have as much choking episodes with her throat and the issue with bloating and pain in her abdomen and chest is much better.  Its not as though all of these issues have resolved but the frequency and intensity of these issues have decreased dramatically.  She is only using her proton pump inhibitor once a day along with her famotidine in the evening.  But she is still very short of breath.  She cannot really exert herself to any degree.  On further questioning, she is extremely weak.  She needs to use her arms to get up from the chair.  She cannot really walk any distance at all without getting significant fatigue of her muscles.  Although she has not been detecting any quivering of her muscles she does get extreme cramping of her muscles and in fact her feet will invert on occasion with cramping.  She can no longer open bottles because her arms are so weak.  She has noticed that she has lost muscle thickness and mass in her arms and legs.  This has been a progressive issue over the course of the past year.  Allergies as of 09/29/2020       Reactions   Benadryl [diphenhydramine] Swelling, Other (See Comments)   Tongue swells and hallucinates- could not talk   Lipase Concentrate-hp  [digestive Enzymes] Rash   Zenpep [pancrelipase (lip-prot-amyl)] Rash   Codeine Nausea And Vomiting, Hypertension   Meperidine Nausea Only, Swelling, Other (See Comments)   Tongue swells and "I feel like death"   Other Other (See Comments)   Tears the skin   Prednisone    Hypertension    Promethazine Other (See Comments)   Hypotension   Propranolol Nausea And Vomiting   Shellfish Allergy Nausea And Vomiting   Tape Other (See Comments)   Tears the skin   Tazarotene Other (See Comments)   Reaction not recalled   Iodinated Diagnostic Agents Nausea Only, Rash        Medication List    aerochamber Z-Stat Plus/medium inhaler Use as instructed   albuterol 108 (90 Base) MCG/ACT inhaler Commonly known as: VENTOLIN HFA Inhale 2 puffs into the lungs every 4 (four) hours as needed for wheezing or shortness of breath.   aspirin 81 MG tablet Take 81 mg by mouth daily.   Breztri Aerosphere 160-9-4.8 MCG/ACT Aero Generic drug: Budeson-Glycopyrrol-Formoterol Inhale 2 puffs into the lungs in the morning and at bedtime.   CALCIUM CITRATE+D3 PETITES PO Take 1 tablet by mouth daily.   dorzolamide-timolol 22.3-6.8 MG/ML ophthalmic solution Commonly known as: COSOPT Place 1 drop into the left eye 2 (two) times daily.   famotidine 40 MG tablet Commonly known as: PEPCID Take one tablet by mouth once daily at night.   levothyroxine 100 MCG  tablet Commonly known as: SYNTHROID TAKE 1 TABLET BY MOUTH DAILY BEFORE BREAKFAST.   losartan 100 MG tablet Commonly known as: COZAAR Take 50 mg by mouth daily.   pantoprazole 40 MG tablet Commonly known as: PROTONIX Take 1 tablet (40 mg total) by mouth 2 (two) times daily.   simvastatin 40 MG tablet Commonly known as: ZOCOR Take 40 mg by mouth daily.   Xelpros 0.005 % Emul Generic drug: Latanoprost Place 1 drop into the left eye at bedtime.        Past Medical History:  Diagnosis Date   Anxiety    Arthritis    Chest pain 2011    CARDIOLITE - no significant symptoms, EKG changes, or arrhythmias   Congenital prolapse of bladder mucosa    Diverticulosis    Dyspnea 02/16/2012   2D ECHO - EF 55-60%, normal   Esophageal dysphagia    Fatigue    GERD (gastroesophageal reflux disease)    Glaucoma    Headache(784.0)    Heart attack (HCC)    Hiatal hernia    High blood pressure 02/16/2012   RENAL DOPPLER - normal   Hyperlipidemia    Hypothyroidism    Labile hypertension    Lightheadedness    Migraine headache    Multiple allergies    Myalgia    OSA (obstructive sleep apnea)    Pain, lower leg    Calf   Problem of menstruation    Proteinuria    Reflux    SOB (shortness of breath)    Thyroid disease    Urinary problem    Valvular heart disease    Vitamin B12 deficiency    Vitamin D deficiency     Past Surgical History:  Procedure Laterality Date   ABDOMINAL HYSTERECTOMY  1976   CHOLECYSTECTOMY  2000   COLONOSCOPY  07/27/2014   Moderate predominantly sigmoid divertuculosis. Otherwise normal colonoscopy   CORONARY ARTERY BYPASS GRAFT N/A 07/10/2018   Procedure: CORONARY ARTERY BYPASS GRAFTING (CABG), FREE LIMA;  Surgeon: [name], MD;  [facility];  Service: Open Heart Surgery;  Laterality: N/A;   CYST REMOVAL NECK     CYSTECTOMY  1974   Intestine   CYSTECTOMY  1996   Brain stem   ESOPHAGOGASTRODUODENOSCOPY  12/07/2016   Schatzki's ring status post esophageal dilatation. Small hiatal hernia. Mild gastritis   EYE SURGERY  2003   EYE SURGERY  07/2016   LEFT HEART CATH AND CORONARY ANGIOGRAPHY N/A 07/08/2018   Procedure: LEFT HEART CATH AND CORONARY ANGIOGRAPHY;  Surgeon: Runell Gess, MD;  [facility] INV[facility];  Service: Cardiovascular;  Laterality: N/A;   LEFT HEART CATH AND CORONARY ANGIOGRAPHY N/A 07/25/2019   Procedure: LEFT HEART CATH AND CORONARY ANGIOGRAPHY;  Surgeon: Kathleene Hazel, MD;  [facility];  Service: Cardiovascular;  Laterality: N/A;    MOUTH SURGERY  2013   RADIOLOGY WITH ANESTHESIA N/A 11/05/2019   Procedure: MRI WITH ANESTHESIA;  Surgeon: Radiologist, Medication, MD;  Location: [facility] OR;  Service: Radiology;  Laterality: N/A;   TEE WITHOUT CARDIOVERSION N/A 07/10/2018   Procedure: TRANSESOPHAGEAL ECHOCARDIOGRAM (TEE);  Surgeon: [name], MD;  Location[facility] OR;  Service: Open Heart Surgery;  Laterality: N/A;   TONSILLECTOMY     age 8    Review of systems negative except as noted in HPI / PMHx or noted below:  Review of Systems  Constitutional: Negative.   HENT: Negative.    Eyes: Negative.   Respiratory:  Negative.    Cardiovascular: Negative.   Gastrointestinal: Negative.   Genitourinary: Negative.   Musculoskeletal: Negative.   Skin: Negative.   Neurological: Negative.   Endo/Heme/Allergies: Negative.   Psychiatric/Behavioral: Negative.      Objective:   Vitals:   09/29/20 1050  BP: (!) 114/58  Pulse: 72  Resp: (!) 22  Temp: 98.1 F (36.7 C)  SpO2: 98%          Physical Exam Constitutional:      Appearance: She is not diaphoretic.  HENT:     Head: Normocephalic.     Right Ear: Tympanic membrane, ear canal and external ear normal.     Left Ear: Tympanic membrane, ear canal and external ear normal.     Nose: Nose normal. No mucosal edema or rhinorrhea.     Mouth/Throat:     Pharynx: Uvula midline. No oropharyngeal exudate.  Eyes:     Conjunctiva/sclera: Conjunctivae normal.  Neck:     Thyroid: No thyromegaly.     Trachea: Trachea normal. No tracheal tenderness or tracheal deviation.  Cardiovascular:     Rate and Rhythm: Normal rate and regular rhythm.     Heart sounds: Normal heart sounds, S1 normal and S2 normal. No murmur heard. Pulmonary:     Effort: No respiratory distress.     Breath sounds: Normal breath sounds. No stridor. No wheezing or rales.  Lymphadenopathy:     Head:     Right side of head: No tonsillar adenopathy.     Left side of head: No tonsillar adenopathy.      Cervical: No cervical adenopathy.  Skin:    Findings: No erythema or rash.     Nails: There is no clubbing.  Neurological:     Mental Status: She is alert.    Diagnostics:    Spirometry was performed and demonstrated an FEV1 of 0.96 at 56 % of predicted.   Assessment and Plan:   1. Dyspnea on exertion   2. LPRD (laryngopharyngeal reflux disease)   3. Esophageal dysfunction   4. Muscle weakness (generalized)   5. Muscle cramping     1.  Evaluation with neurologist for muscle weakness and cramping  2.  Continue with evaluation of throat with ENT for vocal cord dysfunction  3.  Continue with full PFT w/ lung volumes/DLCO  4.  Continue to treat and prevent reflux/LPR:  A. Decrease caffeine consumption B. Pantoprazole 40 mg - 1 tablet 2 times per day C. Famotidine 40 mg - 1 tablet 1 time per day in evening  5. Return to clinic in 4 weeks or earlier if problem  Although Thy has had some improvement regarding her laryngeal issues with therapy directed against LPR she certainly has a lot of other issues that continue and we need to have her undergo further evaluation with full pulmonary function tests / lung volumes and have her throat evaluated with ENT.  Her appointment for ENT is scheduled for 21 October 2020.  As well, with her diffuse muscle weakness and loss of muscle mass and cramping of her muscles on a very frequent basis we need to have her evaluated for a neurodegenerative motor neuron disease with neurology and we will get that set up as soon as possible.  It is quite possible that a potential motor neuron disease could account for her breathing difficulties.  I would like for her to aggressively treat her reflux by using her pantoprazole twice a day in addition to her famotidine at nighttime.  Allena Katz, MD Allergy / Immunology [place]

## 2020-09-30 ENCOUNTER — Encounter: Payer: Self-pay | Admitting: Allergy and Immunology

## 2020-09-30 ENCOUNTER — Telehealth: Payer: Self-pay | Admitting: *Deleted

## 2020-09-30 DIAGNOSIS — R0609 Other forms of dyspnea: Secondary | ICD-10-CM

## 2020-09-30 DIAGNOSIS — R0600 Dyspnea, unspecified: Secondary | ICD-10-CM

## 2020-09-30 NOTE — Telephone Encounter (Signed)
Patient scheduled for 10/21/2020 @1040am  with Dr. 

## 2020-09-30 NOTE — Telephone Encounter (Addendum)
Per Dr. Lucie Leather Evaluation with neurologist for muscle weakness and cramping Pease refer to [facility] Dr Lucia Gaskins. Please advise.    Also she needs to be set up for a  Full PFT w/ lung volumes/DLCO at [facility]. I will call Respiratory Dept at [facility] to set up an Appt.

## 2020-10-01 NOTE — Telephone Encounter (Signed)
Nuerology referral has been placed with Dr. Lucia Gaskins. Left detailed message on pateint's home voicemail.   [facility] [phone], [address] [place],  [place]  [address] Main: [phone] Fax: [phone]

## 2020-10-02 ENCOUNTER — Encounter: Payer: Self-pay | Admitting: Family Medicine

## 2020-10-05 NOTE — Telephone Encounter (Addendum)
PFT was ordered on 7/25 by Dahlia Client. I had called [facility] and they said they no longer do them, it can be done at [facility]. I routed the order to [facility] respiratory dept.   Tried calling [facility] respiratory dept- no answer left voicemail.

## 2020-10-05 NOTE — Telephone Encounter (Signed)
Tried calling Neurology office, left a message for a return call.

## 2020-10-05 NOTE — Telephone Encounter (Signed)
Neurology called me back and they said that they would try to reach out to her today. We will be able to see status under appts since they use epic.

## 2020-10-06 NOTE — Telephone Encounter (Signed)
Called [facility] respiratory dept x 2- left a message for a return call. Need to schedule an appt for pt. It looks like they had tried to call the pt on 8/1 x 2 and 8/3 x 1.

## 2020-10-06 NOTE — Telephone Encounter (Signed)
Pt is scheduled for 12/17/2020 @11 :00 AM [facility] NEW PATIENT 30 with PENUMALLI, VIKRAM R

## 2020-10-07 NOTE — Telephone Encounter (Signed)
Spoke with [facility] Respiratory and scheduled her full PFT for Friday August 26th at 10:00am she will go to main entrance (entrance [address]) off [address]. She can eat or drink before but no caffeine prior. She will need to get a covid test at least 3 days before. She can get it done at [address] [place] between 8-3pm either Tuesday or Wednesday or she can go to a [facility].

## 2020-10-07 NOTE — Telephone Encounter (Signed)
Tried calling pt at home number and left a message.

## 2020-10-08 ENCOUNTER — Telehealth: Payer: Self-pay

## 2020-10-08 NOTE — Telephone Encounter (Signed)
Pt has been informed of appt details. She will call me if she has any questions.

## 2020-10-08 NOTE — Telephone Encounter (Signed)
-----   Message from Suanne Marker, MD sent at 10/07/2020  1:30 PM EDT ----- Can you try to work in this patient with me earlier? New patient, possible ALS / neuromuscular evaluation. Also may check schedules for Dr. Terrace Arabia, Dr. Lucia Gaskins or Dr Epimenio Foot. Thanks, VRP  ----- Message ----- From: Anson Fret, MD Sent: 10/07/2020   9:18 AM EDT To: Suanne Marker, MD, [name], MD  That is just awful. We will absolutely evaluate her. In fact, She already has an upcoming appointment with my colleague Dr. Marjory Lies; we will take good care of her and I will inform Dr. Marjory Lies and see if we can get her in sooner. Thanks.  ----- Message ----- From: [name], MD Sent: 09/30/2020   7:03 AM EDT To: Anson Fret, MD  I have suspicion that Aleighya may have a motor neuron disease with progressive weakness and cramping and dyspnea that has eluded evaluation with pulmonary and cardiology recently. Can you take a look at her? Marlowe Aschoff

## 2020-10-08 NOTE — Telephone Encounter (Signed)
I called the pt and offered a 900 am appt with Dr. Epimenio Foot on 10/12/2020, pt sts she has a scheduled covid test for that day and the a PFT on Friday.   Will keep message and try and get the pt worked in sooner.

## 2020-10-12 ENCOUNTER — Other Ambulatory Visit: Payer: Self-pay

## 2020-10-12 ENCOUNTER — Ambulatory Visit (INDEPENDENT_AMBULATORY_CARE_PROVIDER_SITE_OTHER): Payer: Medicare Other

## 2020-10-12 DIAGNOSIS — Z01812 Encounter for preprocedural laboratory examination: Secondary | ICD-10-CM

## 2020-10-12 DIAGNOSIS — Z20822 Contact with and (suspected) exposure to covid-19: Secondary | ICD-10-CM | POA: Diagnosis not present

## 2020-10-12 DIAGNOSIS — Z1152 Encounter for screening for COVID-19: Secondary | ICD-10-CM

## 2020-10-12 LAB — POC COVID19 BINAXNOW: SARS Coronavirus 2 Ag: NEGATIVE

## 2020-10-12 NOTE — Progress Notes (Signed)
    Patient Name: BENETTA MACLAREN Date of Birth: May 18, 1930 MRN:  [id number] Test Date: 10/12/2020  PHOUA HOADLEY is a 87 y.o. yo female presenting for a Rapid COVID-19 test.    CHIYEKO FERRE is being tested due to requirements for an upcoming appointment.  She denies any COVID symptoms and denies being in contact with anyone with COVID to the best of her knowledge.    Results for orders placed or performed in visit on 10/12/20 (from the past 24 hour(s))  POC COVID-19     Status: Normal   Collection Time: 10/12/20  2:28 PM  Result Value Ref Range   SARS Coronavirus 2 Ag Negative Negative      Jacklynn Bue, LPN 7:82 PM [name] [phone] phone [phone] fax

## 2020-10-13 ENCOUNTER — Telehealth: Payer: Medicare Other

## 2020-10-13 NOTE — Telephone Encounter (Signed)
Pt's appt has been moved to 11/18/20 at 200 pm with Dr. Terrace Arabia. Pt was advised of the office address/ phone number and understands to arrive 30 minutes early the day of her appt. Pt will CB if any issues come up with this appt time.

## 2020-10-15 ENCOUNTER — Ambulatory Visit (HOSPITAL_COMMUNITY)
Admission: RE | Admit: 2020-10-15 | Discharge: 2020-10-15 | Disposition: A | Discharge status: Home / Self Care | Payer: Medicare Other | Source: Ambulatory Visit | Attending: Allergy and Immunology | Admitting: Allergy and Immunology

## 2020-10-15 ENCOUNTER — Other Ambulatory Visit: Payer: Self-pay

## 2020-10-15 DIAGNOSIS — R0600 Dyspnea, unspecified: Secondary | ICD-10-CM | POA: Insufficient documentation

## 2020-10-15 LAB — PULMONARY FUNCTION TEST
DL/VA % pred: 81 %
DL/VA: 3.28 ml/min/mmHg/L
DLCO unc % pred: 59 %
DLCO unc: 10.88 ml/min/mmHg
FEF 25-75 Post: 0.69 L/sec
FEF 25-75 Pre: 0.49 L/sec
FEF2575-%Change-Post: 40 %
FEF2575-%Pred-Post: 66 %
FEF2575-%Pred-Pre: 47 %
FEV1-%Change-Post: 10 %
FEV1-%Pred-Post: 71 %
FEV1-%Pred-Pre: 64 %
FEV1-Post: 1.2 L
FEV1-Pre: 1.08 L
FEV1FVC-%Change-Post: 0 %
FEV1FVC-%Pred-Pre: 85 %
FEV6-%Change-Post: 10 %
FEV6-%Pred-Post: 87 %
FEV6-%Pred-Pre: 79 %
FEV6-Post: 1.87 L
FEV6-Pre: 1.7 L
FEV6FVC-%Change-Post: 0 %
FEV6FVC-%Pred-Post: 102 %
FEV6FVC-%Pred-Pre: 103 %
FVC-%Change-Post: 10 %
FVC-%Pred-Post: 84 %
FVC-%Pred-Pre: 76 %
FVC-Post: 1.94 L
FVC-Pre: 1.75 L
Post FEV1/FVC ratio: 62 %
Post FEV6/FVC ratio: 96 %
Pre FEV1/FVC ratio: 62 %
Pre FEV6/FVC Ratio: 97 %
RV % pred: 122 %
RV: 3.09 L
TLC % pred: 97 %
TLC: 4.94 L

## 2020-10-15 MED ORDER — ALBUTEROL SULFATE (2.5 MG/3ML) 0.083% IN NEBU
2.5000 mg | INHALATION_SOLUTION | Freq: Once | RESPIRATORY_TRACT | Status: AC
  Administered 2020-10-15: 2.5 mg via RESPIRATORY_TRACT

## 2020-10-17 IMAGING — DX PORTABLE CHEST - 1 VIEW
1 series · 1 of 1 positions shown · non-contrast
Comparison: Radiograph May 10, 2018.

CLINICAL DATA: Chest pain.

EXAM:
PORTABLE CHEST 1 VIEW

[chest ap]
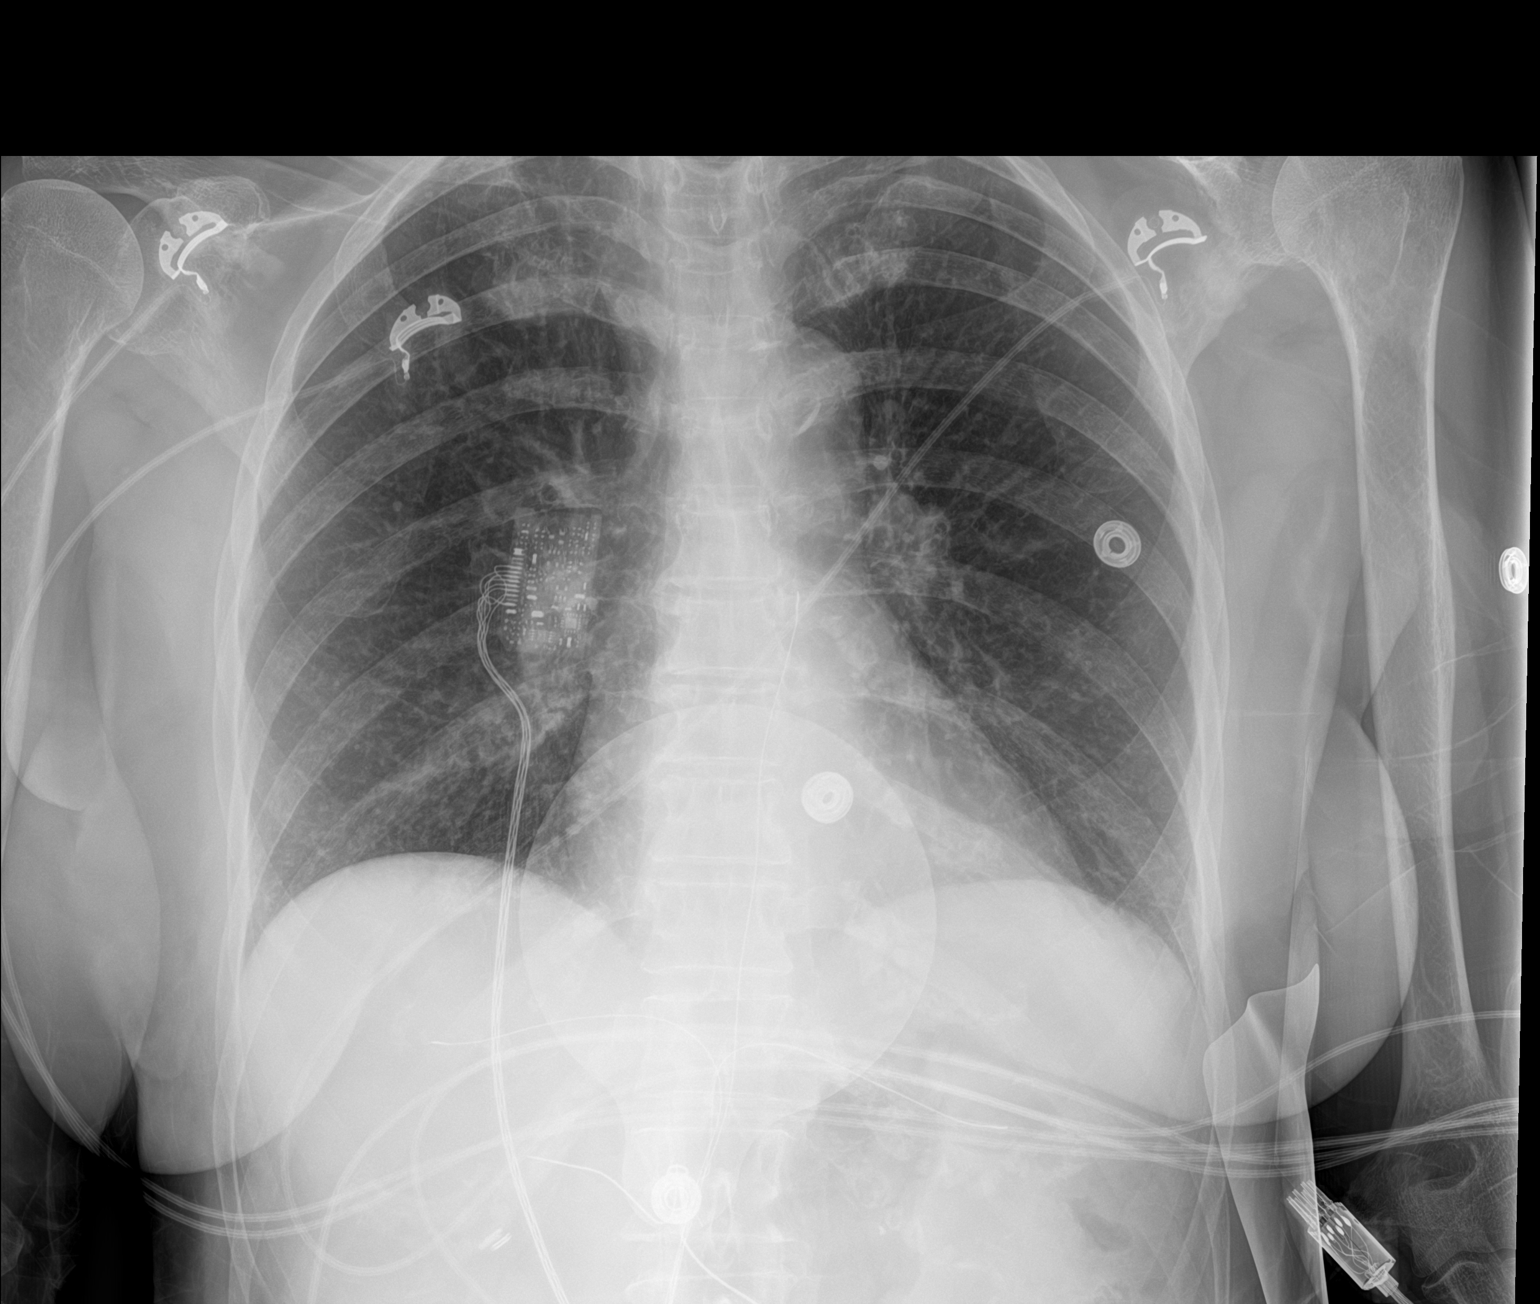

[1 of 1 positions shown; findings below may reference images not displayed]

FINDINGS: The heart size and mediastinal contours are within normal limits.
Both lungs are clear. No pneumothorax or pleural effusion is noted.
Atherosclerosis of thoracic aorta is noted. The visualized skeletal
structures are unremarkable.
IMPRESSION: No active disease.

Aortic Atherosclerosis ([id number].M).

## 2020-10-18 ENCOUNTER — Other Ambulatory Visit: Payer: Self-pay

## 2020-10-18 ENCOUNTER — Telehealth: Payer: Self-pay | Admitting: Neurology

## 2020-10-18 ENCOUNTER — Ambulatory Visit (INDEPENDENT_AMBULATORY_CARE_PROVIDER_SITE_OTHER): Payer: Medicare Other | Admitting: Neurology

## 2020-10-18 ENCOUNTER — Encounter: Payer: Self-pay | Admitting: Neurology

## 2020-10-18 VITALS — BP 138/60 | HR 65 | Ht 63.0 in | Wt 122.0 lb

## 2020-10-18 DIAGNOSIS — R202 Paresthesia of skin: Secondary | ICD-10-CM

## 2020-10-18 DIAGNOSIS — M899 Disorder of bone, unspecified: Secondary | ICD-10-CM

## 2020-10-18 DIAGNOSIS — R269 Unspecified abnormalities of gait and mobility: Secondary | ICD-10-CM

## 2020-10-18 DIAGNOSIS — M542 Cervicalgia: Secondary | ICD-10-CM

## 2020-10-18 HISTORY — DX: Paresthesia of skin: R20.2

## 2020-10-18 MED ORDER — DULOXETINE HCL 20 MG PO CPEP: 20.0000 mg | ORAL_CAPSULE | Freq: Every day | ORAL | 11 refills | Status: DC

## 2020-10-18 NOTE — Telephone Encounter (Signed)
Medicare/usaa order sent to GI. NPR they will reach out to the patient to schedule.

## 2020-10-18 NOTE — Progress Notes (Signed)
Chief Complaint  Patient presents with   New Patient (Initial Visit)    Room 16 with husband, Maisie Fushomas. Referral for muscle cramping in bilateral upper and lower lower extremities. Also, concerned about generalized weakness.      ASSESSMENT AND PLAN  Leah Pittman is a 87 y.o. female   Gait abnormality Neck pain radiating pain to bilateral upper extremity,  Brisk reflex bilateral Babinski sign,  Need to rule out cervical myelopathy  MRI of cervical spine  Laboratory evaluation including CPK  EMG/NCS   DIAGNOSTIC DATA (LABS, IMAGING, TESTING) - I reviewed patient records, labs, notes, testing and imaging myself where available.   MEDICAL HISTORY:  Leah Pittman, is a 87 year old female, seen in request by her primary care physician Dr.   Lucie LeatherKozlow, Minerva AreolaEric for evaluation of gait abnormality, frequent muscle cramping, initial evaluation was with her husband October 18, 2020  I reviewed and summarized the referring note. PMHX HTN Hypothyrodism CAD, in May 2020.  She had open heart surgery for coronary artery disease in May 2020, ever since then she began to experience increased gait abnormality, described bilateral lower extremity feeling weak, extreme tenderness upon deep palpitation, also has neck pain, radiating pain to left more than right, intermittent bilateral upper extremity paresthesia and weakness,  She also complains of worsening constipation, and frequent nocturia,  PHYSICAL EXAM:   Vitals:   10/18/20 1332  BP: 138/60  Pulse: 65  Weight: 122 lb (55.3 kg)  Height: 5\' 3"  (1.6 m)   Not recorded     Body mass index is 21.61 kg/m.  PHYSICAL EXAMNIATION:  Gen: NAD, conversant, well nourised, well groomed                     Cardiovascular: Regular rate rhythm, no peripheral edema, warm, nontender. Eyes: Conjunctivae clear without exudates or hemorrhage Neck: Supple, no carotid bruits. Pulmonary: Clear to auscultation bilaterally   NEUROLOGICAL  EXAM:  MENTAL STATUS: Speech:    Speech is normal; fluent and spontaneous with normal comprehension.  Cognition:     Orientation to time, place and person     Normal recent and remote memory     Normal Attention span and concentration     Normal Language, naming, repeating,spontaneous speech     Fund of knowledge   CRANIAL NERVES: CN II: Visual fields are full to confrontation. Pupils are round equal and briskly reactive to light. CN III, IV, VI: extraocular movement are normal. No ptosis. CN V: Facial sensation is intact to light touch CN VII: Face is symmetric with normal eye closure  CN VIII: Hearing is normal to causal conversation. CN IX, X: Phonation is normal. CN XI: Head turning and shoulder shrug are intact  MOTOR: Mild bilateral shoulder abduction, external rotation weakness, no significant lower extremity muscle weakness, tenderness of bilateral shin upon deep palpitation  REFLEXES: Reflexes are 2+ and symmetric at the biceps, triceps, knees, and ankles. Plantar responses are extensor bilaterally  SENSORY: Intact to light touch, pinprick and vibratory sensation are intact in fingers and toes.  COORDINATION: There is no trunk or limb dysmetria noted.  GAIT/STANCE: She needs push-up to get up from seated position, wide-based, stiff, unsteady  REVIEW OF SYSTEMS:  Full 14 system review of systems performed and notable only for as above All other review of systems were negative.   ALLERGIES: Allergies  Allergen Reactions   Benadryl [Diphenhydramine] Swelling and Other (See Comments)    Tongue swells and hallucinates- could not  talk   Lipase Concentrate-Hp [Digestive Enzymes] Rash   Zenpep [Pancrelipase (Lip-Prot-Amyl)] Rash   Codeine Nausea And Vomiting and Hypertension   Meperidine Nausea Only, Swelling and Other (See Comments)    Tongue swells and "I feel like death"   Other Other (See Comments)    Tears the skin   Prednisone     Hypertension     Promethazine Other (See Comments)    Hypotension    Propranolol Nausea And Vomiting   Shellfish Allergy Nausea And Vomiting   Tape Other (See Comments)    Tears the skin   Tazarotene Other (See Comments)    Reaction not recalled    Iodinated Diagnostic Agents Nausea Only and Rash    HOME MEDICATIONS: Current Outpatient Medications  Medication Sig Dispense Refill   albuterol (VENTOLIN HFA) 108 (90 Base) MCG/ACT inhaler Inhale 2 puffs into the lungs every 4 (four) hours as needed for wheezing or shortness of breath. 18 g 0   aspirin 81 MG tablet Take 81 mg by mouth daily.     benzonatate (TESSALON) 100 MG capsule Take by mouth 2 (two) times daily as needed for cough.     Budeson-Glycopyrrol-Formoterol (BREZTRI AEROSPHERE) 160-9-4.8 MCG/ACT AERO Inhale 2 puffs into the lungs in the morning and at bedtime.     Calcium Citrate-Vitamin D (CALCIUM CITRATE+D3 PETITES PO) Take 1 tablet by mouth daily.     dorzolamide-timolol (COSOPT) 22.3-6.8 MG/ML ophthalmic solution Place 1 drop into the left eye 2 (two) times daily.      famotidine (PEPCID) 40 MG tablet Take one tablet by mouth once daily at night. 30 tablet 5   levothyroxine (SYNTHROID) 100 MCG tablet TAKE 1 TABLET BY MOUTH DAILY BEFORE BREAKFAST. 90 tablet 1   losartan (COZAAR) 100 MG tablet Take 50 mg by mouth daily.     ondansetron (ZOFRAN) 8 MG tablet Take 8 mg by mouth 2 (two) times daily as needed for nausea or vomiting.     pantoprazole (PROTONIX) 40 MG tablet Take 1 tablet (40 mg total) by mouth 2 (two) times daily. (Patient taking differently: Take 40 mg by mouth daily.) 180 tablet 1   simvastatin (ZOCOR) 80 MG tablet Take 80 mg by mouth daily.     Spacer/Aero-Holding Chambers (AEROCHAMBER Z-STAT PLUS/MEDIUM) inhaler Use as instructed 1 each 2   VITAMIN D PO Take 2,000 Units by mouth daily.     XELPROS 0.005 % EMUL Place 1 drop into the left eye at bedtime.     No current facility-administered medications for this visit.     PAST MEDICAL HISTORY: Past Medical History:  Diagnosis Date   Anxiety    Arthritis    Chest pain 2011   CARDIOLITE - no significant symptoms, EKG changes, or arrhythmias   Congenital prolapse of bladder mucosa    Diverticulosis    Dyspnea 02/16/2012   2D ECHO - EF 55-60%, normal   Esophageal dysphagia    Fatigue    GERD (gastroesophageal reflux disease)    Glaucoma    Headache(784.0)    Heart attack (HCC)    Hiatal hernia    High blood pressure 02/16/2012   RENAL DOPPLER - normal   Hyperlipidemia    Hypothyroidism    Labile hypertension    Lightheadedness    Migraine headache    Multiple allergies    Myalgia    OSA (obstructive sleep apnea)    Pain, lower leg    Calf   Problem of menstruation    Proteinuria  Reflux    SOB (shortness of breath)    Thyroid disease    Urinary problem    Valvular heart disease    Vitamin B12 deficiency    Vitamin D deficiency     PAST SURGICAL HISTORY: Past Surgical History:  Procedure Laterality Date   ABDOMINAL HYSTERECTOMY  1976   CHOLECYSTECTOMY  2000   COLONOSCOPY  07/27/2014   Moderate predominantly sigmoid divertuculosis. Otherwise normal colonoscopy   CORONARY ARTERY BYPASS GRAFT N/A 07/10/2018   Procedure: CORONARY ARTERY BYPASS GRAFTING (CABG), FREE LIMA;  Surgeon: [name], MD;  Location: [facility] OR;  Service: Open Heart Surgery;  Laterality: N/A;   CYST REMOVAL NECK     CYSTECTOMY  1974   Intestine   CYSTECTOMY  1996   Brain stem   ESOPHAGOGASTRODUODENOSCOPY  12/07/2016   Schatzki's ring status post esophageal dilatation. Small hiatal hernia. Mild gastritis   EYE SURGERY  2003   EYE SURGERY  07/2016   LEFT HEART CATH AND CORONARY ANGIOGRAPHY N/A 07/08/2018   Procedure: LEFT HEART CATH AND CORONARY ANGIOGRAPHY;  Surgeon: Runell Gess, MD;  Location: [facility] INVASIVE CV LAB;  Service: Cardiovascular;  Laterality: N/A;   LEFT HEART CATH AND CORONARY ANGIOGRAPHY N/A 07/25/2019   Procedure: LEFT HEART CATH AND  CORONARY ANGIOGRAPHY;  Surgeon: Kathleene Hazel, MD;  Location: [facility] INVASIVE CV LAB;  Service: Cardiovascular;  Laterality: N/A;   MOUTH SURGERY  2013   RADIOLOGY WITH ANESTHESIA N/A 11/05/2019   Procedure: MRI WITH ANESTHESIA;  Surgeon: Radiologist, Medication, MD;  Location: [facility] OR;  Service: Radiology;  Laterality: N/A;   TEE WITHOUT CARDIOVERSION N/A 07/10/2018   Procedure: TRANSESOPHAGEAL ECHOCARDIOGRAM (TEE);  Surgeon: [name], MD;  Location[facility] OR;  Service: Open Heart Surgery;  Laterality: N/A;   TONSILLECTOMY     age 47    FAMILY HISTORY: Family History  Problem Relation Age of Onset   Other Mother        blood disorder - unsure of name   Heart attack Father    Stroke Father    Cancer Sister    Cancer Brother    Asthma Brother    Cancer Brother     SOCIAL HISTORY: Social History   Socioeconomic History   Marital status: Married    Spouse name: Not on file   Number of children: 3   Years of education: college   Highest education level: Not on file  Occupational History   Occupation: Retired  Tobacco Use   Smoking status: Never   Smokeless tobacco: Never  Vaping Use   Vaping Use: Never used  Substance and Sexual Activity   Alcohol use: No   Drug use: No   Sexual activity: Not on file  Other Topics Concern   Not on file  Social History Narrative   Lives with husband in [place], [place].   Right-handed.   No daily caffeine use.   Social Determinants of Corporate investment banker Strain: Not on file  Food Insecurity: No Food Insecurity   Worried About Programme researcher, broadcasting/film/video in the Last Year: Never true   Ran Out of Food in the Last Year: Never true  Transportation Needs: Not on file  Physical Activity: Not on file  Stress: Not on file  Social Connections: Not on file  Intimate Partner Violence: Not on file      Levert Feinstein, M.D. Ph.D.  [facility] [address], [address] [place], [place] [address] Ph: [phone]  Fax:  [phone]  CC:  [name], MD [address] [place],  [place] [address]  Blane Ohara, MD

## 2020-10-19 LAB — RPR: RPR Ser Ql: NONREACTIVE

## 2020-10-19 LAB — SEDIMENTATION RATE: Sed Rate: 2 mm/hr (ref 0–40)

## 2020-10-19 LAB — VITAMIN D 25 HYDROXY (VIT D DEFICIENCY, FRACTURES): Vit D, 25-Hydroxy: 37.4 ng/mL (ref 30.0–100.0)

## 2020-10-19 LAB — VITAMIN B12: Vitamin B-12: 433 pg/mL (ref 232–1245)

## 2020-10-19 LAB — C-REACTIVE PROTEIN: CRP: <1 mg/L (ref 0–10)

## 2020-10-19 LAB — CK: Total CK: 90 U/L (ref 26–161)

## 2020-10-19 LAB — ANA W/REFLEX IF POSITIVE: Anti Nuclear Antibody (ANA): NEGATIVE

## 2020-10-19 LAB — FOLATE: Folate: 12.2 ng/mL (ref >3.0)

## 2020-10-19 NOTE — Progress Notes (Signed)
Please call patient for normal laboratory evaluations 

## 2020-10-20 ENCOUNTER — Telehealth: Payer: Self-pay | Admitting: *Deleted

## 2020-10-20 ENCOUNTER — Telehealth: Payer: Self-pay

## 2020-10-20 NOTE — Telephone Encounter (Signed)
Called patient to schedule AWV, left voicemail to call office back to schedule.

## 2020-10-20 NOTE — Telephone Encounter (Signed)
Left message on home phone w/ normal lab results. Provided our number to call back with any questions. I was able to speak to her husband and also provided him with the information.

## 2020-10-20 NOTE — Telephone Encounter (Signed)
-----   Message from Levert Feinstein, MD sent at 10/19/2020  5:04 PM EDT ----- Please call patient for normal laboratory evaluations

## 2020-11-02 ENCOUNTER — Ambulatory Visit (INDEPENDENT_AMBULATORY_CARE_PROVIDER_SITE_OTHER): Payer: Medicare Other

## 2020-11-02 ENCOUNTER — Emergency Department (HOSPITAL_COMMUNITY): Payer: Medicare Other

## 2020-11-02 ENCOUNTER — Emergency Department (HOSPITAL_COMMUNITY)
Admission: EM | Admit: 2020-11-02 | Discharge: 2020-11-02 | Disposition: A | Discharge status: Home / Self Care | Payer: Medicare Other | Attending: Physician Assistant | Admitting: Physician Assistant

## 2020-11-02 ENCOUNTER — Other Ambulatory Visit: Payer: Self-pay

## 2020-11-02 VITALS — BP 118/72 | HR 74 | Resp 20

## 2020-11-02 DIAGNOSIS — R197 Diarrhea, unspecified: Secondary | ICD-10-CM | POA: Diagnosis not present

## 2020-11-02 DIAGNOSIS — R0789 Other chest pain: Secondary | ICD-10-CM | POA: Insufficient documentation

## 2020-11-02 DIAGNOSIS — R519 Headache, unspecified: Secondary | ICD-10-CM | POA: Diagnosis not present

## 2020-11-02 DIAGNOSIS — R112 Nausea with vomiting, unspecified: Secondary | ICD-10-CM

## 2020-11-02 DIAGNOSIS — R1013 Epigastric pain: Secondary | ICD-10-CM | POA: Diagnosis not present

## 2020-11-02 DIAGNOSIS — Z5321 Procedure and treatment not carried out due to patient leaving prior to being seen by health care provider: Secondary | ICD-10-CM | POA: Insufficient documentation

## 2020-11-02 LAB — CBC WITH DIFFERENTIAL/PLATELET
Abs Immature Granulocytes: 0.04 10*3/uL (ref 0.00–0.07)
Basophils Absolute: 0.1 10*3/uL (ref 0.0–0.1)
Basophils Relative: 1 %
Eosinophils Absolute: 0.4 10*3/uL (ref 0.0–0.5)
Eosinophils Relative: 5 %
HCT: 38.7 % (ref 36.0–46.0)
Hemoglobin: 12.9 g/dL (ref 12.0–15.0)
Immature Granulocytes: 1 %
Lymphocytes Relative: 24 %
Lymphs Abs: 1.9 10*3/uL (ref 0.7–4.0)
MCH: 31.9 pg (ref 26.0–34.0)
MCHC: 33.3 g/dL (ref 30.0–36.0)
MCV: 95.6 fL (ref 80.0–100.0)
Monocytes Absolute: 1 10*3/uL (ref 0.1–1.0)
Monocytes Relative: 13 %
Neutro Abs: 4.6 10*3/uL (ref 1.7–7.7)
Neutrophils Relative %: 56 %
Platelets: 256 10*3/uL (ref 150–400)
RBC: 4.05 MIL/uL (ref 3.87–5.11)
RDW: 14.1 % (ref 11.5–15.5)
WBC: 8 10*3/uL (ref 4.0–10.5)
nRBC: 0 % (ref 0.0–0.2)

## 2020-11-02 LAB — POC COVID19 BINAXNOW: SARS Coronavirus 2 Ag: NEGATIVE

## 2020-11-02 LAB — COMPREHENSIVE METABOLIC PANEL
ALT: 19 U/L (ref 0–44)
AST: 18 U/L (ref 15–41)
Albumin: 3.9 g/dL (ref 3.5–5.0)
Alkaline Phosphatase: 56 U/L (ref 38–126)
Anion gap: 9 (ref 5–15)
BUN: 14 mg/dL (ref 8–23)
CO2: 24 mmol/L (ref 22–32)
Calcium: 10.1 mg/dL (ref 8.9–10.3)
Chloride: 102 mmol/L (ref 98–111)
Creatinine, Ser: 0.92 mg/dL (ref 0.44–1.00)
GFR, Estimated: >60 mL/min (ref >60)
Glucose, Bld: 96 mg/dL (ref 70–99)
Potassium: 3.8 mmol/L (ref 3.5–5.1)
Sodium: 135 mmol/L (ref 135–145)
Total Bilirubin: 0.7 mg/dL (ref 0.3–1.2)
Total Protein: 6.8 g/dL (ref 6.5–8.1)

## 2020-11-02 LAB — TROPONIN I (HIGH SENSITIVITY): Troponin I (High Sensitivity): 7 ng/L (ref <18)

## 2020-11-02 LAB — LIPASE, BLOOD: Lipase: 43 U/L (ref 11–51)

## 2020-11-02 NOTE — Progress Notes (Signed)
      Patient walked into office, accompanied by her husband.  She requested a COVID test due to nausea.  Patient states that she ate ribs Saturday and later that evening she experienced vomiting and severe abdominal pain "worse than child birth".  She also had abnormal BM ("not diarrhea but abnormal") and later told me that there was "black" when she wiped and "red" when she wiped during a different BM.  She has not had a BM in the past two days nor has she vomited since Sunday.  Patient could not describe color/texture of emesis when asked.    Today she is positive for nausea, headache, abdominal pain, increased fatigue and increased weakness.  Per Dr Sedalia Muta, patient advised to go to ED.  She and her husband are in agreement and will go to [facility].  Her rapid COVID Test was NEGATIVE.  Creola Corn, LPN 56/86/16 8:37 PM

## 2020-11-02 NOTE — ED Notes (Signed)
Blue top sent-DR

## 2020-11-02 NOTE — ED Notes (Signed)
Patient states they are leaving. Encouraged to stay.

## 2020-11-02 NOTE — ED Triage Notes (Signed)
Pt here with multiple complaints. Pt states she always have a burning chest pain but now it radiates into her belly. Episode of vomiting and bloody stool Sunday.  Pressure high in triage. 229/89. Pt endorses headache.

## 2020-11-02 NOTE — ED Provider Notes (Signed)
Emergency Medicine Provider Triage Evaluation Note  Leah Pittman , a 87 y.o. female  was evaluated in triage.  Pt complains of lower chest and epigastric pain. Further reports she started having a ha 2 days ago and experienced nv with this as well.  Review of Systems  Positive: Chest pain, abd pain, nv, ha Negative: fever  Physical Exam  BP (!) 229/91 (BP Location: Right Arm)   Pulse 73   Temp 99 F (37.2 C)   Resp 16   SpO2 99%  Gen:   Awake, no distress   Resp:  Normal effort  MSK:   Moves extremities without difficulty  Other:  Epigastric ttp, clear speech, moving all extremities, dp pulses 2+ and symmetric  Medical Decision Making  Medically screening exam initiated at 6:30 PM.  Appropriate orders placed.  Leah Pittman was informed that the remainder of the evaluation will be completed by another provider, this initial triage assessment does not replace that evaluation, and the importance of remaining in the ED until their evaluation is complete.     Samson Frederic, Neidy Guerrieri S, PA-C 11/02/20 1833    Franne Forts, DO 11/02/20 2359

## 2020-11-04 ENCOUNTER — Telehealth: Payer: Self-pay

## 2020-11-04 NOTE — Telephone Encounter (Signed)
Leah Pittman called in follow-up of her ED visit.  She did not stay to see the physician because her husband is unable to drive at night.  She has not had any severe pain in the abdomen or vomiting but she continues to have nausea. Dr. Sedalia Muta reviewed the labs and xrays and advised follow-up with GI.

## 2020-12-02 ENCOUNTER — Telehealth: Payer: Self-pay

## 2020-12-02 NOTE — Chronic Care Management (AMB) (Signed)
Chronic Care Management Pharmacy Assistant   Name: Leah Pittman  MRN: [id number] DOB: Sep 04, 1930   Reason for Encounter: Disease State call for HTN   Recent office visits:  11/02/20 Blane Ohara MD. Pt walked in and was advised to go to ER due to symptoms. No med changes.  10/12/20 Caro Hight LPN. Seen for Annual Wellness Visit. No med changes.   09/21/20 Blane Ohara MD. Seen for CAD. Increased Losartan Potassium 25 mg daily to 50 mg daily. D/C Vitamin D 2000 units due to completing course. Follow up in 4 months.  Recent consult visits:  10/18/20 (Neurology) Levert Feinstein MD. Started Duloxetine HCI 20 mg daily. Increased Simvastatin 40 mg daily to 80 mg daily.  09/30/20 (Allergy Center) Telephone Encounter. Referred to Neurology.  09/29/20 (Allergy Center) Laurette Schimke MD. Seen for Dyspnea on exertion. No med changes. Follow up in 4 weeks  09/22/20 (Ophthalmology) [name], Bonnita Nasuti MD. Seen for Glaucoma Evaluation. No med changes.  09/08/20 (Allergy Center) Laurette Schimke MD. Seen for Dyspnea on exertion. Referred to ENT. Started Famotidine 40 mg daily at night. Follow up in 2 weeks.  08/13/20 (Pulmonology) Vilma Meckel MD. Seen for Dyspnea on exertion. No med changes. Follow up in 3 months.  Hospital visits:  Medication Reconciliation was completed by comparing discharge summary, patient's EMR and Pharmacy list, and upon discussion with patient.  Admitted to the hospital on 11/02/20 due to Chest Paint. Discharge date was 11/02/20. Discharged from [facility].    Medications remain the same after Hospital Discharge:??     Medications: Outpatient Encounter Medications as of 12/02/2020  Medication Sig Note   albuterol (VENTOLIN HFA) 108 (90 Base) MCG/ACT inhaler Inhale 2 puffs into the lungs every 4 (four) hours as needed for wheezing or shortness of breath.    aspirin 81 MG tablet Take 81 mg by mouth daily.    benzonatate (TESSALON) 100 MG capsule Take by  mouth 2 (two) times daily as needed for cough.    Budeson-Glycopyrrol-Formoterol (BREZTRI AEROSPHERE) 160-9-4.8 MCG/ACT AERO Inhale 2 puffs into the lungs in the morning and at bedtime.    Calcium Citrate-Vitamin D (CALCIUM CITRATE+D3 PETITES PO) Take 1 tablet by mouth daily.    dorzolamide-timolol (COSOPT) 22.3-6.8 MG/ML ophthalmic solution Place 1 drop into the left eye 2 (two) times daily.     DULoxetine (CYMBALTA) 20 MG capsule Take 1 capsule (20 mg total) by mouth daily.    famotidine (PEPCID) 40 MG tablet Take one tablet by mouth once daily at night.    levothyroxine (SYNTHROID) 100 MCG tablet TAKE 1 TABLET BY MOUTH DAILY BEFORE BREAKFAST.    losartan (COZAAR) 100 MG tablet Take 50 mg by mouth daily.    ondansetron (ZOFRAN) 8 MG tablet Take 8 mg by mouth 2 (two) times daily as needed for nausea or vomiting.    pantoprazole (PROTONIX) 40 MG tablet Take 1 tablet (40 mg total) by mouth 2 (two) times daily. (Patient taking differently: Take 40 mg by mouth daily.)    simvastatin (ZOCOR) 80 MG tablet Take 80 mg by mouth daily. 10/18/2020: 10/18/20 - still taking 40mg  daily, not started 80mg  yet   Spacer/Aero-Holding Chambers (AEROCHAMBER Z-STAT PLUS/MEDIUM) inhaler Use as instructed    VITAMIN D PO Take 2,000 Units by mouth daily.    XELPROS 0.005 % EMUL Place 1 drop into the left eye at bedtime.    No facility-administered encounter medications on file as of 12/02/2020.     Recent Office Vitals:  BP Readings from Last 3 Encounters:  11/02/20 (!) 173/63  11/02/20 118/72  10/18/20 138/60   Pulse Readings from Last 3 Encounters:  11/02/20 (!) 16  11/02/20 74  10/18/20 65    Wt Readings from Last 3 Encounters:  10/18/20 122 lb (55.3 kg)  09/21/20 122 lb (55.3 kg)  09/08/20 122 lb 9.6 oz (55.6 kg)     Kidney Function Lab Results  Component Value Date/Time   CREATININE 0.92 11/02/2020 06:32 PM   CREATININE 0.88 09/23/2020 09:30 AM   GFR 60.94 01/27/2020 04:11 PM   GFRNONAA >60  11/02/2020 06:32 PM   GFRAA 64 12/22/2019 11:40 AM    BMP Latest Ref Rng & Units 11/02/2020 09/23/2020 07/15/2020  Glucose 70 - 99 mg/dL 96 95 96  BUN 8 - 23 mg/dL 14 16 10   Creatinine 0.44 - 1.00 mg/dL 9.93 5.70  BUN/Creat Ratio 12 - 28 - 18 -  Sodium 135 - 145 mmol/L 135 138 137  Potassium 3.5 - 5.1 mmol/L 3.8 4.5 4.1  Chloride 98 - 111 mmol/L 102 103 109  CO2 22 - 32 mmol/L 24 23 23   Calcium 8.9 - 10.3 mg/dL 1.77 9.9 9.4     Current COPD regimen:  Breztri  inhaler 160-9-4.8 mcg/act inhale 2 puffs in the lungs in am and pm. Not Taking  Albuterol Inhaler 108 Inhale 2 puff into lungs every 4 hours as needed  Spacer/Aero-Holding Chambers Inhaler  Any recent hospitalizations or ED visits since last visit with CPP? Yes  reports the following COPD symptoms, including Increased shortness of breath  and Symptoms worse with exercise   What recent interventions/DTPs have been made by any provider to improve breathing since last visit: Pt states no changes    Have you had exacerbation/flare-up since last visit? No  What do you do when you are short of breath?  Rescue medication and Rest  Current tobacco use? No   Respiratory Devices/Equipment Do you have a nebulizer? No Do you use a Peak Flow Meter? No Do you use a maintenance inhaler? Yes How often do you forget to use your daily inhaler? Never  Do you use a rescue inhaler? Yes How often do you use your rescue inhaler?  prn Do you use a spacer with your inhaler? Yes  Adherence Review: Does the patient have >5 day gap between last estimated fill date for maintenance inhaler medications? Last fill was on 07/16/20 on Albuterol HFA 90 MCG inhaler. Pt only uses it as needed.  Care Gaps: Last annual wellness visit? Done on 10/12/20   07/18/20, CMA Clinical Pharmacist Assistant  [phone]

## 2020-12-17 ENCOUNTER — Ambulatory Visit: Payer: Medicare Other | Admitting: Diagnostic Neuroimaging

## 2020-12-23 ENCOUNTER — Other Ambulatory Visit: Payer: Self-pay | Admitting: Allergy and Immunology

## 2020-12-30 ENCOUNTER — Other Ambulatory Visit: Payer: Self-pay

## 2020-12-30 ENCOUNTER — Ambulatory Visit: Payer: Medicare Other | Admitting: Pulmonary Disease

## 2020-12-30 MED ORDER — LOSARTAN POTASSIUM 100 MG PO TABS: 50.0000 mg | ORAL_TABLET | Freq: Every day | ORAL | 0 refills | Status: DC

## 2021-01-05 ENCOUNTER — Telehealth: Payer: Self-pay | Admitting: Neurology

## 2021-01-05 ENCOUNTER — Encounter (HOSPITAL_COMMUNITY): Payer: Self-pay

## 2021-01-05 ENCOUNTER — Telehealth: Payer: Self-pay

## 2021-01-05 ENCOUNTER — Emergency Department (HOSPITAL_COMMUNITY)
Admission: EM | Admit: 2021-01-05 | Discharge: 2021-01-05 | Disposition: A | Discharge status: Home / Self Care | Payer: Medicare Other | Attending: Emergency Medicine | Admitting: Emergency Medicine

## 2021-01-05 ENCOUNTER — Emergency Department (HOSPITAL_COMMUNITY): Payer: Medicare Other

## 2021-01-05 ENCOUNTER — Ambulatory Visit (INDEPENDENT_AMBULATORY_CARE_PROVIDER_SITE_OTHER): Payer: Medicare Other | Admitting: Neurology

## 2021-01-05 ENCOUNTER — Encounter (INDEPENDENT_AMBULATORY_CARE_PROVIDER_SITE_OTHER): Payer: Medicare Other | Admitting: Neurology

## 2021-01-05 VITALS — BP 232/97 | HR 74

## 2021-01-05 DIAGNOSIS — R202 Paresthesia of skin: Secondary | ICD-10-CM | POA: Diagnosis not present

## 2021-01-05 DIAGNOSIS — M542 Cervicalgia: Secondary | ICD-10-CM

## 2021-01-05 DIAGNOSIS — I2510 Atherosclerotic heart disease of native coronary artery without angina pectoris: Secondary | ICD-10-CM | POA: Diagnosis not present

## 2021-01-05 DIAGNOSIS — Z7982 Long term (current) use of aspirin: Secondary | ICD-10-CM | POA: Diagnosis not present

## 2021-01-05 DIAGNOSIS — I10 Essential (primary) hypertension: Secondary | ICD-10-CM | POA: Diagnosis not present

## 2021-01-05 DIAGNOSIS — R269 Unspecified abnormalities of gait and mobility: Secondary | ICD-10-CM | POA: Diagnosis not present

## 2021-01-05 DIAGNOSIS — I25810 Atherosclerosis of coronary artery bypass graft(s) without angina pectoris: Secondary | ICD-10-CM | POA: Diagnosis not present

## 2021-01-05 DIAGNOSIS — Z79899 Other long term (current) drug therapy: Secondary | ICD-10-CM | POA: Insufficient documentation

## 2021-01-05 DIAGNOSIS — R079 Chest pain, unspecified: Secondary | ICD-10-CM | POA: Diagnosis present

## 2021-01-05 DIAGNOSIS — Z0289 Encounter for other administrative examinations: Secondary | ICD-10-CM

## 2021-01-05 LAB — CBC
HCT: 40.3 % (ref 36.0–46.0)
Hemoglobin: 13.3 g/dL (ref 12.0–15.0)
MCH: 31.1 pg (ref 26.0–34.0)
MCHC: 33 g/dL (ref 30.0–36.0)
MCV: 94.2 fL (ref 80.0–100.0)
Platelets: 237 10*3/uL (ref 150–400)
RBC: 4.28 MIL/uL (ref 3.87–5.11)
RDW: 14.2 % (ref 11.5–15.5)
WBC: 8 10*3/uL (ref 4.0–10.5)
nRBC: 0 % (ref 0.0–0.2)

## 2021-01-05 LAB — BASIC METABOLIC PANEL
Anion gap: 7 (ref 5–15)
BUN: 20 mg/dL (ref 8–23)
CO2: 25 mmol/L (ref 22–32)
Calcium: 10.1 mg/dL (ref 8.9–10.3)
Chloride: 106 mmol/L (ref 98–111)
Creatinine, Ser: 0.78 mg/dL (ref 0.44–1.00)
GFR, Estimated: >60 mL/min (ref >60)
Glucose, Bld: 113 mg/dL — ABNORMAL HIGH (ref 70–99)
Potassium: 4.4 mmol/L (ref 3.5–5.1)
Sodium: 138 mmol/L (ref 135–145)

## 2021-01-05 LAB — TROPONIN I (HIGH SENSITIVITY)
Troponin I (High Sensitivity): 6 ng/L (ref <18)
Troponin I (High Sensitivity): 11 ng/L (ref <18)

## 2021-01-05 MED ORDER — CLONIDINE HCL 0.1 MG PO TABS
0.1000 mg | ORAL_TABLET | Freq: Once | ORAL | Status: AC
  Administered 2021-01-05: 0.1 mg via ORAL
  Filled 2021-01-05: qty 1

## 2021-01-05 NOTE — ED Provider Notes (Signed)
Emergency Medicine Provider Triage Evaluation Note  Leah Pittman , a 87 y.o. female  was evaluated in triage.  Pt complains of htn. She was at a neurology office pta to have a procedure completed however was noted to have an elevated BP at 236 systolic. She states at that time she was having chest pain. She denies headache  Review of Systems  Positive: Cp, sob (chronic) Negative: headache  Physical Exam  BP (!) 214/91 (BP Location: Right Arm)   Pulse 78   Temp 98.1 F (36.7 C) (Oral)   Resp 20   Ht 5\' 4"  (1.626 m)   Wt 54.4 kg   SpO2 100%   BMI 20.60 kg/m  Gen:   Awake, no distress   Resp:  Normal effort  MSK:   Moves extremities without difficulty  Other:    Medical Decision Making  Medically screening exam initiated at 12:18 PM.  Appropriate orders placed.  Leah Pittman was informed that the remainder of the evaluation will be completed by another provider, this initial triage assessment does not replace that evaluation, and the importance of remaining in the ED until their evaluation is complete.     [name], PA-C 01/05/21 1218    01/07/21, MD 01/05/21 1416

## 2021-01-05 NOTE — Discharge Instructions (Addendum)
Return for any problem.   You should take your blood pressure medications every day.  Please follow-up closely with your regular doctor and your cardiologist for continued assessment and care of your blood pressure.

## 2021-01-05 NOTE — Telephone Encounter (Signed)
Noted,   10/21/20 2nd attempt [facility] EPIC [facility]  10/19/20 1st attempt epic [facility]//AC 10/18/20 Medicare/usaa order sent to GI [facility]  Sent the order again to GI, they will reach out to the patient to schedule.

## 2021-01-05 NOTE — Telephone Encounter (Signed)
Patient walked in to office with complaint's of high blood pressure at the neurologist this morning with SBP 236- patient also states that she has been having chest pain and is actively holding her chest. Offered to call an Ambulance for patient, but patient denied. Advised patient she should report directly to the ER to be seen. Patient also concerned that she needs follow up with Dr. Salena Saner. Advised patient that Dr. Renaye Rakers nurse will call with appointment. Patient's husband is to take patient to ER. Will forward to MD to make aware.

## 2021-01-05 NOTE — Progress Notes (Signed)
No chief complaint on file.     ASSESSMENT AND PLAN  Leah Pittman is a 87 y.o. female   Gait abnormality Neck pain radiating pain to bilateral upper extremity,  Brisk reflex bilateral Babinski sign,  Need to rule out cervical myelopathy  MRI of cervical spine  Laboratory evaluation showed no significant abnormalities,  Hypertension urgency  Significantly elevated blood pressure 230/85 heart rate of 74, after 100 mg of losartan,  Patient will see her cardiologist, also provide local urgent care address, will be taken by her husband   DIAGNOSTIC DATA (LABS, IMAGING, TESTING) - I reviewed patient records, labs, notes, testing and imaging myself where available.  Laboratory evaluations in 2022 showed normal or negative CMP, CBC, RPR, VitD, ANA, ESR, CPK, folic acid, G54, C-reactive protein,  MEDICAL HISTORY:  Leah Pittman, is a 87 year old female, seen in request by her primary care physician Dr.   Neldon Mc, Randall Hiss for evaluation of gait abnormality, frequent muscle cramping, initial evaluation was with her husband October 18, 2020  I reviewed and summarized the referring note. PMHX HTN Hypothyrodism CAD, in May 2020.  She had open heart surgery for coronary artery disease in May 2020, ever since then she began to experience increased gait abnormality, described bilateral lower extremity feeling weak, extreme tenderness upon deep palpitation, also has neck pain, radiating pain to left more than right, intermittent bilateral upper extremity paresthesia and weakness,  She also complains of worsening constipation, and frequent nocturia,  Update January 05, 2021, She is accompanied by her husband at today's visit, supposed to come back for EMG nerve conduction study for her complaints of left-sided neck pain, radiating towards left scalp, occasionally left shoulder, slow worsening gait abnormality over the past few years, with frequent nocturia, hyperreflexia on previous  examination  However, during today's interview, she was noted to have significantly elevated blood pressure, multiple measurement was in 230/85-ish and above, heart rate was 74, she also complains of left-sided chest pain, along previous CABG scar, tenderness upon deep palpitation, reported been having this steady pain, left chest region since open heart surgery.  She has very poor vision due to long history of glaucoma, can only read extreme large print, could not do finger count in two arms length.  PHYSICAL EXAM:   Vitals:   01/05/21 1052  BP: (!) 232/97  Pulse: 74   Not recorded     There is no height or weight on file to calculate BMI.  PHYSICAL EXAMNIATION:  Gen: NAD, conversant, well nourised, well groomed                     Cardiovascular: Regular rate rhythm, no peripheral edema, warm, nontender. Eyes: Conjunctivae clear without exudates or hemorrhage Neck: Supple, no carotid bruits. Pulmonary: Clear to auscultation bilaterally   NEUROLOGICAL EXAM:  MENTAL STATUS: Speech/cognition: Awake, alert, oriented to history taking and casual conversation,   CRANIAL NERVES: CN II: Very poor vision, could only read very large print at near vision, could not count fingers with 2 arm length CN III, IV, VI: extraocular movement are normal. No ptosis. CN V: Facial sensation is intact to light touch CN VII: Face is symmetric with normal eye closure  CN VIII: Hearing is normal to causal conversation. CN IX, X: Phonation is normal. CN XI: Head turning and shoulder shrug are intact  MOTOR: Mild bilateral shoulder abduction, external rotation weakness, no significant lower extremity muscle weakness, tenderness of bilateral shin upon deep palpitation  REFLEXES:  Reflexes are 2+ and symmetric at the biceps, triceps, knees, and ankles. Plantar responses are extensor bilaterally  SENSORY: Intact to light touch, pinprick and vibratory sensation are intact in fingers and  toes.  COORDINATION: There is no trunk or limb dysmetria noted.  GAIT/STANCE: She needs push-up to get up from seated position, wide-based, stiff, unsteady, apparently also limited by her poor vision  REVIEW OF SYSTEMS:  Full 14 system review of systems performed and notable only for as above All other review of systems were negative.   ALLERGIES: Allergies  Allergen Reactions   Benadryl [Diphenhydramine] Swelling and Other (See Comments)    Tongue swells and hallucinates- could not talk   Lipase Concentrate-Hp [Digestive Enzymes] Rash   Zenpep [Pancrelipase (Lip-Prot-Amyl)] Rash   Codeine Nausea And Vomiting and Hypertension   Meperidine Nausea Only, Swelling and Other (See Comments)    Tongue swells and "I feel like death"   Other Other (See Comments)    Tears the skin   Prednisone     Hypertension    Promethazine Other (See Comments)    Hypotension    Propranolol Nausea And Vomiting   Shellfish Allergy Nausea And Vomiting   Tape Other (See Comments)    Tears the skin   Tazarotene Other (See Comments)    Reaction not recalled    Iodinated Diagnostic Agents Nausea Only and Rash    HOME MEDICATIONS: Current Outpatient Medications  Medication Sig Dispense Refill   albuterol (VENTOLIN HFA) 108 (90 Base) MCG/ACT inhaler Inhale 2 puffs into the lungs every 4 (four) hours as needed for wheezing or shortness of breath. 18 g 0   aspirin 81 MG tablet Take 81 mg by mouth daily.     benzonatate (TESSALON) 100 MG capsule Take by mouth 2 (two) times daily as needed for cough.     Budeson-Glycopyrrol-Formoterol (BREZTRI AEROSPHERE) 160-9-4.8 MCG/ACT AERO Inhale 2 puffs into the lungs in the morning and at bedtime.     Calcium Citrate-Vitamin D (CALCIUM CITRATE+D3 PETITES PO) Take 1 tablet by mouth daily.     dorzolamide-timolol (COSOPT) 22.3-6.8 MG/ML ophthalmic solution Place 1 drop into the left eye 2 (two) times daily.      DULoxetine (CYMBALTA) 20 MG capsule Take 1 capsule (20  mg total) by mouth daily. 30 capsule 11   famotidine (PEPCID) 40 MG tablet TAKE ONE TABLET BY MOUTH ONCE DAILY AT NIGHT. 90 tablet 1   levothyroxine (SYNTHROID) 100 MCG tablet TAKE 1 TABLET BY MOUTH DAILY BEFORE BREAKFAST. 90 tablet 1   losartan (COZAAR) 100 MG tablet Take 0.5 tablets (50 mg total) by mouth daily. 45 tablet 0   ondansetron (ZOFRAN) 8 MG tablet Take 8 mg by mouth 2 (two) times daily as needed for nausea or vomiting.     pantoprazole (PROTONIX) 40 MG tablet Take 1 tablet (40 mg total) by mouth 2 (two) times daily. (Patient taking differently: Take 40 mg by mouth daily.) 180 tablet 1   simvastatin (ZOCOR) 80 MG tablet Take 80 mg by mouth daily.     Spacer/Aero-Holding Chambers (AEROCHAMBER Z-STAT PLUS/MEDIUM) inhaler Use as instructed 1 each 2   VITAMIN D PO Take 2,000 Units by mouth daily.     XELPROS 0.005 % EMUL Place 1 drop into the left eye at bedtime.     No current facility-administered medications for this visit.    PAST MEDICAL HISTORY: Past Medical History:  Diagnosis Date   Anxiety    Arthritis    Chest pain 2011  CARDIOLITE - no significant symptoms, EKG changes, or arrhythmias   Congenital prolapse of bladder mucosa    Diverticulosis    Dyspnea 02/16/2012   2D ECHO - EF 55-60%, normal   Esophageal dysphagia    Fatigue    GERD (gastroesophageal reflux disease)    Glaucoma    Headache(784.0)    Heart attack (HCC)    Hiatal hernia    High blood pressure 02/16/2012   RENAL DOPPLER - normal   Hyperlipidemia    Hypothyroidism    Labile hypertension    Lightheadedness    Migraine headache    Multiple allergies    Myalgia    OSA (obstructive sleep apnea)    Pain, lower leg    Calf   Problem of menstruation    Proteinuria    Reflux    SOB (shortness of breath)    Thyroid disease    Urinary problem    Valvular heart disease    Vitamin B12 deficiency    Vitamin D deficiency     PAST SURGICAL HISTORY: Past Surgical History:  Procedure  Laterality Date   ABDOMINAL HYSTERECTOMY  1976   CHOLECYSTECTOMY  2000   COLONOSCOPY  07/27/2014   Moderate predominantly sigmoid divertuculosis. Otherwise normal colonoscopy   CORONARY ARTERY BYPASS GRAFT N/A 07/10/2018   Procedure: CORONARY ARTERY BYPASS GRAFTING (CABG), FREE LIMA;  Surgeon: [name], MD;  Location: [facility] OR;  Service: Open Heart Surgery;  Laterality: N/A;   CYST REMOVAL NECK     CYSTECTOMY  1974   Intestine   CYSTECTOMY  1996   Brain stem   ESOPHAGOGASTRODUODENOSCOPY  12/07/2016   Schatzki's ring status post esophageal dilatation. Small hiatal hernia. Mild gastritis   EYE SURGERY  2003   EYE SURGERY  07/2016   LEFT HEART CATH AND CORONARY ANGIOGRAPHY N/A 07/08/2018   Procedure: LEFT HEART CATH AND CORONARY ANGIOGRAPHY;  Surgeon: Runell Gess, MD;  Location: [facility] INVASIVE [facility];  Service: Cardiovascular;  Laterality: N/A;   LEFT HEART CATH AND CORONARY ANGIOGRAPHY N/A 07/25/2019   Procedure: LEFT HEART CATH AND CORONARY ANGIOGRAPHY;  Surgeon: Kathleene Hazel, MD;  Location: [facility];  Service: Cardiovascular;  Laterality: N/A;   MOUTH SURGERY  2013   RADIOLOGY WITH ANESTHESIA N/A 11/05/2019   Procedure: MRI WITH ANESTHESIA;  Surgeon: Radiologist, Medication, MD;  Location: [facility] OR;  Service: Radiology;  Laterality: N/A;   TEE WITHOUT CARDIOVERSION N/A 07/10/2018   Procedure: TRANSESOPHAGEAL ECHOCARDIOGRAM (TEE);  Surgeon: [name], MD;  Location: [facility] OR;  Service: Open Heart Surgery;  Laterality: N/A;   TONSILLECTOMY     age 14    FAMILY HISTORY: Family History  Problem Relation Age of Onset   Other Mother        blood disorder - unsure of name   Heart attack Father    Stroke Father    Cancer Sister    Cancer Brother    Asthma Brother    Cancer Brother     SOCIAL HISTORY: Social History   Socioeconomic History   Marital status: Married    Spouse name: Not on file   Number of children: 3   Years of education: college   Highest  education level: Not on file  Occupational History   Occupation: Retired  Tobacco Use   Smoking status: Never   Smokeless tobacco: Never  Vaping Use   Vaping Use: Never used  Substance and Sexual Activity   Alcohol use: No  Drug use: No   Sexual activity: Not on file  Other Topics Concern   Not on file  Social History Narrative   Lives with husband in [place], [place].   Right-handed.   No daily caffeine use.   Social Determinants of Health   Financial Resource Strain: Not on file  Food Insecurity: Not on file  Transportation Needs: Not on file  Physical Activity: Not on file  Stress: Not on file  Social Connections: Not on file  Intimate Partner Violence: Not on file    Total time spent reviewing the chart, obtaining history, examined patient, ordering tests, documentation, consultations and family, care coordination was 10 minutes     Marcial Pacas, M.D. Ph.D.  [facility] [address], [place], [place] [address] Ph: [phone] Fax: [phone]  CC:  Rochel Brome, MD [place],  [place] [address]  Rochel Brome, MD

## 2021-01-05 NOTE — Telephone Encounter (Signed)
Please make sure she is on schedule for MRI cervical spine

## 2021-01-05 NOTE — ED Triage Notes (Signed)
Pt presents with c/o chest pain that has been going on for several weeks. Pt does have a hx of quadruple bypass surgery. Pt is hypertensive in triage.

## 2021-01-05 NOTE — ED Provider Notes (Signed)
[facility] DEPT Provider Note   CSN: [id number] Arrival date & time: 01/05/21  1141     History Chief Complaint  Patient presents with   Chest Pain    Leah Pittman is a 87 y.o. female.  86 year old female with a medical history as detailed below presents for evaluation.  Patient with reported elevated blood pressure.  She was seen by her neurologist this morning and referred to the ED for evaluation of elevated blood pressure.  Patient reports that she has fairly labile blood pressures.  She uses losartan daily for treatment of hypertension.  She reports that when her blood pressure is well controlled she takes a half a tablet (50mg ) of losartan.  On days when her BP is increased she takes 100 mg of losartan.  She last took a 100 mg of losartan this morning.  She is complaining of chronic chest discomfort.  She reports that this discomfort has been present since she had her CABG.  She complains of persistent constant pain to the anterior chest wall.    The history is provided by the patient.  Hypertension This is a chronic problem. The current episode started more than 1 week ago. The problem occurs constantly. The problem has not changed since onset.Associated symptoms include chest pain. Pertinent negatives include no abdominal pain. Nothing aggravates the symptoms. Nothing relieves the symptoms.      Past Medical History:  Diagnosis Date   Anxiety    Arthritis    Chest pain 2011   CARDIOLITE - no significant symptoms, EKG changes, or arrhythmias   Congenital prolapse of bladder mucosa    Diverticulosis    Dyspnea 02/16/2012   2D ECHO - EF 55-60%, normal   Esophageal dysphagia    Fatigue    GERD (gastroesophageal reflux disease)    Glaucoma    Headache(784.0)    Heart attack ([facility])    Hiatal hernia    High blood pressure 02/16/2012   RENAL DOPPLER - normal   Hyperlipidemia    Hypothyroidism    Labile hypertension    Lightheadedness     Migraine headache    Multiple allergies    Myalgia    OSA (obstructive sleep apnea)    Pain, lower leg    Calf   Problem of menstruation    Proteinuria    Reflux    SOB (shortness of breath)    Thyroid disease    Urinary problem    Valvular heart disease    Vitamin B12 deficiency    Vitamin D deficiency     Patient Active Problem List   Diagnosis Date Noted   Hypertension 01/05/2021   Gait abnormality 10/18/2020   Paresthesia 10/18/2020   Disorder of bone, unspecified  10/18/2020   NSTEMI (non-ST elevated myocardial infarction) ([facility]) 07/14/2020   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   Unstable gait 12/22/2019   Paresthesia of both feet 12/22/2019   Hypothyroidism    Pancreatic duct dilated    Descending thoracic aortic dissection    Acute cystitis with hematuria 08/20/2019   Hoarseness of voice 08/20/2019   Medication management 08/20/2019   SOB (shortness of breath) 08/20/2019   Irritable bowel syndrome with constipation 08/20/2019   Chronic obstructive pulmonary disease ([facility]) 08/20/2019   Abdominal pain, chronic, epigastric 08/20/2019   Hypertensive crisis 07/24/2019   Neck pain 07/24/2019   S/P CABG x 4 07/10/2018   Coronary artery disease involving native coronary artery of native heart without angina pectoris 07/08/2018  Essential hypertension 08/14/2017   Nuclear sclerotic cataract, left 06/23/2016   Hypercholesterolemia 11/30/2015   Precordial chest pain 01/13/2015   Bilateral occipital neuralgia 12/16/2014   Osteoarthritis of cervical spine 12/16/2014   GERD (gastroesophageal reflux disease) 04/01/2013   Epiphora 02/03/2013   History of Clostridium difficile infection 02/03/2013   History of dacryocystitis 02/03/2013   Stenosis of nasolacrimal duct, 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 Otherwise normal colonoscopy   CORONARY ARTERY BYPASS GRAFT N/A 07/10/2018   Procedure: CORONARY ARTERY BYPASS GRAFTING (CABG), FREE LIMA;  Surgeon: Gaye Pollack, MD;  Location: [facility] OR;  Service: Open Heart Surgery;  Laterality: N/A;   CYST REMOVAL NECK     CYSTECTOMY  1974   Intestine   CYSTECTOMY  1996   Brain stem   ESOPHAGOGASTRODUODENOSCOPY  12/07/2016   Schatzki's ring status post esophageal dilatation. Small hiatal hernia. Mild gastritis   EYE SURGERY  2003   EYE SURGERY  07/2016   LEFT HEART CATH AND CORONARY ANGIOGRAPHY N/A 07/08/2018   Procedure: LEFT HEART CATH AND CORONARY ANGIOGRAPHY;  Surgeon: Lorretta Harp, MD;  Location: [facility] CV LAB;  Service: Cardiovascular;  Laterality: N/A;   LEFT HEART CATH AND CORONARY ANGIOGRAPHY N/A 07/25/2019   Procedure: LEFT HEART CATH AND CORONARY ANGIOGRAPHY;  Surgeon: Burnell Blanks, MD;  Location: [facility] CV LAB;  Service: Cardiovascular;  Laterality: N/A;   MOUTH SURGERY  2013   RADIOLOGY WITH ANESTHESIA N/A 11/05/2019   Procedure: MRI WITH ANESTHESIA;  Surgeon: Radiologist, Medication, MD;  Location: [facility];  Service: Radiology;  Laterality: N/A;   TEE WITHOUT CARDIOVERSION N/A 07/10/2018   Procedure: TRANSESOPHAGEAL ECHOCARDIOGRAM (TEE);  Surgeon: Gaye Pollack, MD;  Location: [facility];  Service: Open Heart Surgery;  Laterality: N/A;   TONSILLECTOMY     age 9     OB History   No obstetric history on file.     Family History  Problem Relation Age of Onset   Other Mother        blood disorder - unsure of name   Heart attack Father    Stroke Father    Cancer Sister    Cancer Brother    Asthma Brother    Cancer Brother     Social History   Tobacco Use   Smoking status: Never   Smokeless tobacco: Never  Vaping Use   Vaping Use: Never used  Substance Use  Topics   Alcohol use: No   Drug use: No    Home Medications Prior to Admission medications   Medication Sig Start Date End Date Taking? Authorizing Provider  albuterol (VENTOLIN HFA) 108 (90 Base) MCG/ACT inhaler Inhale 2 puffs into the lungs every 4 (four) hours as needed for wheezing or shortness of breath. 07/16/20   Sharion Settler, DO  aspirin 81 MG tablet Take 81 mg by mouth daily.    [provider]  benzonatate (TESSALON) 100 MG capsule Take by mouth 2 (two) times daily as needed for cough.    [provider]  Budeson-Glycopyrrol-Formoterol (BREZTRI AEROSPHERE) 160-9-4.8 MCG/ACT AERO Inhale 2 puffs into the lungs in the morning and at bedtime.    [provider]  Calcium Citrate-Vitamin D (CALCIUM CITRATE+D3 PETITES PO) Take 1 tablet by mouth daily.    [provider]  dorzolamide-timolol (COSOPT) 22.3-6.8 MG/ML ophthalmic solution Place 1 drop into the left eye 2 (two) times daily.  06/05/12   [provider]  DULoxetine (CYMBALTA) 20 MG capsule Take 1 capsule (20 mg total) by mouth daily. 10/18/20   Marcial Pacas, MD  famotidine (PEPCID) 40 MG tablet TAKE ONE TABLET BY MOUTH ONCE DAILY AT NIGHT. 12/23/20   Kozlow, Donnamarie Poag, MD  levothyroxine (SYNTHROID) 100 MCG tablet TAKE 1 TABLET BY MOUTH DAILY BEFORE BREAKFAST. 09/27/20   Rip Harbour, NP  losartan (COZAAR) 100 MG tablet Take 0.5 tablets (50 mg total) by mouth daily. 12/30/20   [name], Elnita Maxwell, MD  ondansetron (ZOFRAN) 8 MG tablet Take 8 mg by mouth 2 (two) times daily as needed for nausea or vomiting.    [provider]  pantoprazole (PROTONIX) 40 MG tablet Take 1 tablet (40 mg total) by mouth 2 (two) times daily. Patient taking differently: Take 40 mg by mouth daily. 06/14/20   [name], Elnita Maxwell, MD  simvastatin (ZOCOR) 80 MG tablet Take 80 mg by mouth daily.    [provider]  Spacer/Aero-Holding Chambers (AEROCHAMBER Z-STAT PLUS/MEDIUM) inhaler Use as instructed 07/16/20    Sharion Settler, DO  VITAMIN D PO Take 2,000 Units by mouth daily.    [provider]  XELPROS 0.005 % EMUL Place 1 drop into the left eye at bedtime. 05/13/20   [provider]    Allergies    Benadryl [diphenhydramine], Lipase concentrate-hp [digestive enzymes], Zenpep [pancrelipase (lip-prot-amyl)], Codeine, Meperidine, Other, Prednisone, Promethazine, Propranolol, Shellfish allergy, Tape, Tazarotene, and Iodinated diagnostic agents  Review of Systems   Review of Systems  Cardiovascular:  Positive for chest pain.  Gastrointestinal:  Negative for abdominal pain.  All other systems reviewed and are negative.  Physical Exam Updated Vital Signs BP (!) 199/80 (BP Location: Left Arm)   Pulse 68   Temp 98.1 F (36.7 C) (Oral)   Resp 18   Ht 5\' 4"  (1.626 m)   Wt 54.4 kg   SpO2 100%   BMI 20.60 kg/m   Physical Exam Vitals and nursing note reviewed.  Constitutional:      General: She is not in acute distress.    Appearance: Normal appearance. She is well-developed.  HENT:     Head: Normocephalic and atraumatic.  Eyes:     Conjunctiva/sclera: Conjunctivae normal.     Pupils: Pupils are equal, round, and reactive to light.  Cardiovascular:     Rate and Rhythm: Normal rate and regular rhythm.     Heart sounds: Normal heart sounds.  Pulmonary:     Effort: Pulmonary effort is normal. No respiratory distress.     Breath sounds: Normal breath sounds.  Abdominal:     General: There is no distension.     Palpations: Abdomen is soft.     Tenderness: There is no abdominal tenderness.  Musculoskeletal:        General: No deformity. Normal range of motion.     Cervical back: Normal range of motion and neck supple.     Comments: Diffuse tenderness to palpation across the anterior chest wall.  Patient's pain appears to be reproduced with palpation.  Right anterior chest wall is more tender with palpation than left.  No overlying crepitus or erythema noted to the chest  wall itself.  Skin:    General: Skin is warm and  dry.  Neurological:     General: No focal deficit present.     Mental Status: She is alert and oriented to person, place, and time.    ED Results / Procedures / Treatments   Labs (all labs ordered are listed, but only abnormal results are displayed) Labs Reviewed  BASIC METABOLIC PANEL - Abnormal; Notable for the following components:      Result Value   Glucose, Bld 113 (*)    All other components within normal limits  CBC  TROPONIN I (HIGH SENSITIVITY)  TROPONIN I (HIGH SENSITIVITY)    EKG EKG Interpretation  Date/Time:  Wednesday January 05 2021 11:59:41 EST Ventricular Rate:  71 PR Interval:  124 QRS Duration: 92 QT Interval:  405 QTC Calculation: 441 R Axis:   57 Text Interpretation: Sinus rhythm Borderline ST depression, diffuse leads Confirmed by Kristine Royal [phone]) on 01/05/2021 12:27:21 PM  Radiology DG Chest 1 View  Result Date: 01/05/2021 CLINICAL DATA:  Hypertension. EXAM: CHEST  1 VIEW COMPARISON:  November 02, 2020. FINDINGS: The heart size and mediastinal contours are within normal limits. Status post coronary bypass graft. Both lungs are clear. The visualized skeletal structures are unremarkable. IMPRESSION: No active disease. Electronically Signed   By: Lupita Raider M.D.   On: 01/05/2021 13:10    Procedures Procedures   Medications Ordered in ED Medications  cloNIDine (CATAPRES) tablet 0.1 mg (has no administration in time range)    ED Course  I have reviewed the triage vital signs and the nursing notes.  Pertinent labs & imaging results that were available during my care of the patient were reviewed by me and considered in my medical decision making (see chart for details).    MDM Rules/Calculators/A&P                           MDM  MSE complete  JULISSA BROWNING was evaluated in Emergency Department on 01/05/2021 for the symptoms described in the history of present illness. She was  evaluated in the context of the global COVID-19 pandemic, which necessitated consideration that the patient might be at risk for infection with the SARS-CoV-2 virus that causes COVID-19. Institutional protocols and algorithms that pertain to the evaluation of patients at risk for COVID-19 are in a state of rapid change based on information released by regulatory bodies including the CDC and federal and state organizations. These policies and algorithms were followed during the patient's care in the ED.  Patient is complaining of persistent elevated blood pressure.  Patient was sent from her neurologist office today for evaluation.  Patient with established long standing history of labile hypertension.  Patient does endorse chest discomfort.  However, patient's chest discomfort is atypical for ischemia.  Patient's pain is reproducible with palpation of the anterior chest wall.  EKG obtained today is without evidence of acute ischemia.  Initial troponin 6.  Second troponin is 11.  BP is improved after ED evaluation.  Patient actually never took the clonidine 0.1 mg offered to her.  Blood pressure at time of discharge was 185/77.  Patient and her husband (at bedside) offered admission. She (and her husband) decline same. They desire DC home.   Again, patient desires DC home. She does feel improved. She is aware of need for close follow up with her already established cardiology team / PCP.   Final Clinical Impression(s) / ED Diagnoses Final diagnoses:  Hypertension, unspecified type    Rx /  DC Orders ED Discharge Orders     None        Valarie Merino, MD 01/05/21 1623

## 2021-01-11 ENCOUNTER — Other Ambulatory Visit: Payer: Self-pay

## 2021-01-11 ENCOUNTER — Ambulatory Visit (INDEPENDENT_AMBULATORY_CARE_PROVIDER_SITE_OTHER): Payer: Medicare Other | Admitting: Family Medicine

## 2021-01-11 ENCOUNTER — Encounter: Payer: Self-pay | Admitting: Family Medicine

## 2021-01-11 VITALS — BP 160/70 | HR 72 | Temp 97.4°F | Resp 16 | Ht 64.0 in | Wt 122.0 lb

## 2021-01-11 DIAGNOSIS — I110 Hypertensive heart disease with heart failure: Secondary | ICD-10-CM | POA: Diagnosis not present

## 2021-01-11 NOTE — Patient Instructions (Addendum)
Bp not at goal. Increase losartan to 100 mg 1/2 twice a day.

## 2021-01-11 NOTE — Progress Notes (Signed)
Subjective:  Patient ID: Leah MillinMary E Verhoeven, female    DOB: Jul 05, 1930  Age: 87 y.o. MRN: [id number]  Chief Complaint  Patient presents with   Hypertension   Follow-up    HPI  Pt was seeing neurology, Dr. Terrace ArabiaYan, and bp was 232/97. They did not do the NCV. They recommended she go to ED. She went down the street to her cardiologist, [place]. She was sent [facility] where it was determined her bp was out of control. Pt had left sided CP along previous CABG scar. On losartan 50-100 mg depending if bp is very high. Pt is checking bps at home. 114/46 this morning on losartan 100 mg 1/2 daily.   Current Outpatient Medications on File Prior to Visit  Medication Sig Dispense Refill   albuterol (VENTOLIN HFA) 108 (90 Base) MCG/ACT inhaler Inhale 2 puffs into the lungs every 4 (four) hours as needed for wheezing or shortness of breath. 18 g 0   aspirin 81 MG tablet Take 81 mg by mouth daily.     benzonatate (TESSALON) 100 MG capsule Take by mouth 2 (two) times daily as needed for cough.     Budeson-Glycopyrrol-Formoterol (BREZTRI AEROSPHERE) 160-9-4.8 MCG/ACT AERO Inhale 2 puffs into the lungs 2 (two) times daily as needed (for flares).     Cholecalciferol (VITAMIN D3 SUPER STRENGTH) 50 MCG (2000 UT) CAPS Take 2,000 Units by mouth daily after lunch.     dorzolamide-timolol (COSOPT) 22.3-6.8 MG/ML ophthalmic solution Place 1 drop into the left eye 2 (two) times daily.      famotidine (PEPCID) 40 MG tablet TAKE ONE TABLET BY MOUTH ONCE DAILY AT NIGHT. (Patient taking differently: Take 40 mg by mouth at bedtime.) 90 tablet 1   levothyroxine (SYNTHROID) 100 MCG tablet TAKE 1 TABLET BY MOUTH DAILY BEFORE BREAKFAST. (Patient taking differently: Take 100 mcg by mouth daily before breakfast.) 90 tablet 1   losartan (COZAAR) 100 MG tablet Take 0.5 tablets (50 mg total) by mouth daily. 45 tablet 0   ondansetron (ZOFRAN) 8 MG tablet Take 8 mg by mouth 2 (two) times daily as needed for nausea  or vomiting.     pantoprazole (PROTONIX) 40 MG tablet Take 1 tablet (40 mg total) by mouth 2 (two) times daily. (Patient taking differently: Take 40 mg by mouth See admin instructions. Take 40 mg by mouth in the morning before breakfast and 40,mg after lunch) 180 tablet 1   Spacer/Aero-Holding Chambers (AEROCHAMBER Z-STAT PLUS/MEDIUM) inhaler Use as instructed 1 each 2   XELPROS 0.005 % EMUL Place 1 drop into the left eye at bedtime.     DULoxetine (CYMBALTA) 20 MG capsule Take 1 capsule (20 mg total) by mouth daily. (Patient not taking: Reported on 01/11/2021) 30 capsule 11   No current facility-administered medications on file prior to visit.   Past Medical History:  Diagnosis Date   Anxiety    Arthritis    Chest pain 2011   CARDIOLITE - no significant symptoms, EKG changes, or arrhythmias   Congenital prolapse of bladder mucosa    Diverticulosis    Dyspnea 02/16/2012   2D ECHO - EF 55-60%, normal   Esophageal dysphagia    Fatigue    GERD (gastroesophageal reflux disease)    Glaucoma    Headache(784.0)    Heart attack (HCC)    Hiatal hernia    High blood pressure 02/16/2012   RENAL DOPPLER - normal   Hyperlipidemia    Hypothyroidism  Labile hypertension    Lightheadedness    Migraine headache    Multiple allergies    Myalgia    OSA (obstructive sleep apnea)    Pain, lower leg    Calf   Problem of menstruation    Proteinuria    Reflux    SOB (shortness of breath)    Thyroid disease    Urinary problem    Valvular heart disease    Vitamin B12 deficiency    Vitamin D deficiency    Past Surgical History:  Procedure Laterality Date   ABDOMINAL HYSTERECTOMY  1976   CHOLECYSTECTOMY  2000   COLONOSCOPY  07/27/2014   Moderate predominantly sigmoid divertuculosis. Otherwise normal colonoscopy   CORONARY ARTERY BYPASS GRAFT N/A 07/10/2018   Procedure: CORONARY ARTERY BYPASS GRAFTING (CABG), FREE LIMA;  Surgeon: [name], MD;  Location: [facility] OR;  Service: Open Heart  Surgery;  Laterality: N/A;   CYST REMOVAL NECK     CYSTECTOMY  1974   Intestine   CYSTECTOMY  1996   Brain stem   ESOPHAGOGASTRODUODENOSCOPY  12/07/2016   Schatzki's ring status post esophageal dilatation. Small hiatal hernia. Mild gastritis   EYE SURGERY  2003   EYE SURGERY  07/2016   LEFT HEART CATH AND CORONARY ANGIOGRAPHY N/A 07/08/2018   Procedure: LEFT HEART CATH AND CORONARY ANGIOGRAPHY;  Surgeon: Runell Gess, MD;  Location: [facility] INVASIVE CV LAB;  Service: Cardiovascular;  Laterality: N/A;   LEFT HEART CATH AND CORONARY ANGIOGRAPHY N/A 07/25/2019   Procedure: LEFT HEART CATH AND CORONARY ANGIOGRAPHY;  Surgeon: Kathleene Hazel, MD;  Location: [facility] INVASIVE CV LAB;  Service: Cardiovascular;  Laterality: N/A;   MOUTH SURGERY  2013   RADIOLOGY WITH ANESTHESIA N/A 11/05/2019   Procedure: MRI WITH ANESTHESIA;  Surgeon: Radiologist, Medication, MD;  Location: [facility] OR;  Service: Radiology;  Laterality: N/A;   TEE WITHOUT CARDIOVERSION N/A 07/10/2018   Procedure: TRANSESOPHAGEAL ECHOCARDIOGRAM (TEE);  Surgeon: [name], MD;  Location: [facility] OR;  Service: Open Heart Surgery;  Laterality: N/A;   TONSILLECTOMY     age 32    Family History  Problem Relation Age of Onset   Other Mother        blood disorder - unsure of name   Heart attack Father    Stroke Father    Cancer Sister    Cancer Brother    Asthma Brother    Cancer Brother    Social History   Socioeconomic History   Marital status: Married    Spouse name: Not on file   Number of children: 3   Years of education: college   Highest education level: Not on file  Occupational History   Occupation: Retired  Tobacco Use   Smoking status: Never   Smokeless tobacco: Never  Building services engineer Use: Never used  Substance and Sexual Activity   Alcohol use: No   Drug use: No   Sexual activity: Not on file  Other Topics Concern   Not on file  Social History Narrative   Lives with husband in [place], [place].    Right-handed.   No daily caffeine use.   Social Determinants of Health   Financial Resource Strain: Not on file  Food Insecurity: Not on file  Transportation Needs: Not on file  Physical Activity: Not on file  Stress: Not on file  Social Connections: Not on file    Review of Systems  Constitutional:  Negative for chills, fatigue and fever.  HENT:  Negative for congestion, ear pain and sore throat.   Respiratory:  Positive for shortness of breath. Negative for cough.   Cardiovascular:  Negative for chest pain and palpitations.  Gastrointestinal:  Negative for abdominal pain, constipation, diarrhea, nausea and vomiting.  Endocrine: Negative for polydipsia, polyphagia and polyuria.  Genitourinary:  Negative for difficulty urinating and dysuria.  Musculoskeletal:  Negative for arthralgias, back pain and myalgias.  Skin:  Negative for rash.  Neurological:  Positive for headaches (slight).  Psychiatric/Behavioral:  Negative for dysphoric mood. The patient is not nervous/anxious.     Objective:  BP (!) 160/70   Pulse 72   Temp (!) 97.4 F (36.3 C)   Resp 16   Ht 5\' 4"  (1.626 m)   Wt 122 lb (55.3 kg)   SpO2 98%   BMI 20.94 kg/m   BP/Weight 01/18/2021 01/11/2021 0000000  Systolic BP 99991111 0000000 [id number]  Diastolic BP 42 70 77  Wt. (Lbs) - 122 120  BMI - 20.94 20.6    Physical Exam Vitals reviewed.  Constitutional:      Appearance: Normal appearance. She is normal weight.  Neck:     Vascular: No carotid bruit.  Cardiovascular:     Rate and Rhythm: Normal rate and regular rhythm.     Heart sounds: Normal heart sounds.  Pulmonary:     Effort: Pulmonary effort is normal. No respiratory distress.     Breath sounds: Normal breath sounds.  Abdominal:     General: Abdomen is flat. Bowel sounds are normal.     Palpations: Abdomen is soft.     Tenderness: There is no abdominal tenderness.  Neurological:     Mental Status: She is alert and oriented to person, place, and time.   Psychiatric:        Mood and Affect: Mood normal.        Behavior: Behavior normal.    Diabetic Foot Exam - Simple   No data filed      Lab Results  Component Value Date   WBC 8.0 01/05/2021   HGB 13.3 01/05/2021   HCT 40.3 01/05/2021   PLT 237 01/05/2021   GLUCOSE 113 (H) 01/05/2021   CHOL 181 09/23/2020   TRIG 99 09/23/2020   HDL 67 09/23/2020   LDLCALC 96 09/23/2020   ALT 19 11/02/2020   AST 18 11/02/2020   NA 138 01/05/2021   K 4.4 01/05/2021   CL 106 01/05/2021   CREATININE 0.78 01/05/2021   BUN 20 01/05/2021   CO2 25 01/05/2021   TSH 2.291 07/15/2020   INR 0.9 07/14/2020   HGBA1C 5.8 (H) 07/10/2018      Assessment & Plan:   Problem List Items Addressed This Visit       Cardiovascular and Mediastinum   Hypertensive heart disease with heart failure ([place]) - Primary    Bp not at goal. Increase losartan to 100 mg 1/2 twice a day.      .  No orders of the defined types were placed in this encounter.   No orders of the defined types were placed in this encounter.    Follow-up: No follow-ups on file.  An After Visit Summary was printed and given to the patient.  Rochel Brome, MD [facility] [phone]

## 2021-01-12 ENCOUNTER — Telehealth: Payer: Self-pay

## 2021-01-12 NOTE — Progress Notes (Signed)
Chronic Care Management Pharmacy Assistant   Name: Leah Pittman  MRN: [id number] DOB: Jul 02, 1930   Reason for Encounter: Disease State for HTN   Recent office visits:  01/11/21 Blane Ohara MD. Seen for HTN. Increased Losartan to 100 mg 1/2 twice a day.  Recent consult visits:  None   Hospital visits:  Medication Reconciliation was completed by comparing discharge summary, patient's EMR and Pharmacy list, and upon discussion with patient.  Admitted to the hospital on 01/05/21 due to HTN. Discharge date was 01/05/21. Discharged from [facility].    New?Medications Started at [facility] Discharge:?? Clonidine 0.1 mg   -All other medications will remain the same.    Medications: Outpatient Encounter Medications as of 01/12/2021  Medication Sig Note   albuterol (VENTOLIN HFA) 108 (90 Base) MCG/ACT inhaler Inhale 2 puffs into the lungs every 4 (four) hours as needed for wheezing or shortness of breath.    aspirin 81 MG tablet Take 81 mg by mouth daily.    benzonatate (TESSALON) 100 MG capsule Take by mouth 2 (two) times daily as needed for cough.    Budeson-Glycopyrrol-Formoterol (BREZTRI AEROSPHERE) 160-9-4.8 MCG/ACT AERO Inhale 2 puffs into the lungs 2 (two) times daily as needed (for flares).    Cholecalciferol (VITAMIN D3 SUPER STRENGTH) 50 MCG (2000 UT) CAPS Take 2,000 Units by mouth daily after lunch.    dorzolamide-timolol (COSOPT) 22.3-6.8 MG/ML ophthalmic solution Place 1 drop into the left eye 2 (two) times daily.     DULoxetine (CYMBALTA) 20 MG capsule Take 1 capsule (20 mg total) by mouth daily. (Patient not taking: Reported on 01/11/2021)    famotidine (PEPCID) 40 MG tablet TAKE ONE TABLET BY MOUTH ONCE DAILY AT NIGHT. (Patient taking differently: Take 40 mg by mouth at bedtime.)    levothyroxine (SYNTHROID) 100 MCG tablet TAKE 1 TABLET BY MOUTH DAILY BEFORE BREAKFAST. (Patient taking differently: Take 100 mcg by mouth daily before breakfast.)     losartan (COZAAR) 100 MG tablet Take 0.5 tablets (50 mg total) by mouth daily.    ondansetron (ZOFRAN) 8 MG tablet Take 8 mg by mouth 2 (two) times daily as needed for nausea or vomiting. 01/05/2021: Provider: The patient's Rx expired this past Spring- will you write a new order for it, please?   pantoprazole (PROTONIX) 40 MG tablet Take 1 tablet (40 mg total) by mouth 2 (two) times daily. (Patient taking differently: Take 40 mg by mouth See admin instructions. Take 40 mg by mouth in the morning before breakfast and 40,mg after lunch) 01/05/2021: The patient stated she takes both of her tablets by midday (each day)   simvastatin (ZOCOR) 20 MG tablet Take 20 mg by mouth at bedtime.    Spacer/Aero-Holding Chambers (AEROCHAMBER Z-STAT PLUS/MEDIUM) inhaler Use as instructed    XELPROS 0.005 % EMUL Place 1 drop into the left eye at bedtime.    No facility-administered encounter medications on file as of 01/12/2021.     Recent Office Vitals: BP Readings from Last 3 Encounters:  01/11/21 (!) 160/70  01/05/21 (!) 184/77  01/05/21 (!) 232/97   Pulse Readings from Last 3 Encounters:  01/11/21 72  01/05/21 66  01/05/21 74    Wt Readings from Last 3 Encounters:  01/11/21 122 lb (55.3 kg)  01/05/21 120 lb (54.4 kg)  10/18/20 122 lb (55.3 kg)     Kidney Function Lab Results  Component Value Date/Time   CREATININE 0.78 01/05/2021 12:38 PM   CREATININE 0.92 11/02/2020 06:32 PM  GFR 60.94 01/27/2020 04:11 PM   GFRNONAA >60 01/05/2021 12:38 PM   GFRAA 64 12/22/2019 11:40 AM    BMP Latest Ref Rng & Units 01/05/2021 11/02/2020 09/23/2020  Glucose 70 - 99 mg/dL 196(Q) 96 95  BUN 8 - 23 mg/dL 20 14 16   Creatinine 0.44 - 1.00 mg/dL 2.29 7.98  BUN/Creat Ratio 12 - 28 - - 18  Sodium 135 - 145 mmol/L 138 135 138  Potassium 3.5 - 5.1 mmol/L 4.4 3.8 4.5  Chloride 98 - 111 mmol/L 106 102 103  CO2 22 - 32 mmol/L 25 24 23   Calcium 8.9 - 10.3 mg/dL 9.21 9.9     Current antihypertensive  regimen:  Losartan 100 mg Take 0.5 tablet daily- Pt is taking 50 mg twice daily per Dr. 19.4   Patient verbally confirms she is taking the above medications as directed. Yes  How often are you checking your Blood Pressure? daily  she checks her blood pressure in the morning before taking her medication.  Current home BP readings: 01/11/21 114/42 at home 01/11/21 160/70 at doctors   Wrist or arm cuff: Arm  Caffeine intake:No caffeine  Salt intake:limited  OTC medications including pseudoephedrine or NSAIDs? Tylenol   Any readings above 180/120? Yes when she was admitted to ER If yes any symptoms of hypertensive emergency? shortness of breath, headaches    What recent interventions/DTPs have been made by any provider to improve Blood Pressure control since last CPP Visit: Pt losartan was increased due to poor BP control.  Any recent hospitalizations or ED visits since last visit with CPP? Yes  What diet changes have been made to improve Blood Pressure Control?  Pt stated no diet changes. She mostly eats vegetables and fruits. Pt stated she struggles eating since her loss of taste since heart surgery 2 years ago.   What exercise is being done to improve your Blood Pressure Control?  No exercise due to getting short of breath but tries to walk around house  Adherence Review: Is the patient currently on ACE/ARB medication? Yes Does the patient have >5 day gap between last estimated fill dates? No  Care Gaps: Last annual wellness visit? 10/12/20  Star Rating Drugs:  Medication:  Last Fill: Day Supply Losartan  12/30/20 90ds Simvastatin   10/19/20 90ds    13/10/22, CMA Clinical Pharmacist Assistant  [phone]

## 2021-01-15 ENCOUNTER — Other Ambulatory Visit: Payer: Self-pay | Admitting: Family Medicine

## 2021-01-18 ENCOUNTER — Ambulatory Visit (INDEPENDENT_AMBULATORY_CARE_PROVIDER_SITE_OTHER): Payer: Medicare Other

## 2021-01-18 DIAGNOSIS — E782 Mixed hyperlipidemia: Secondary | ICD-10-CM

## 2021-01-18 DIAGNOSIS — I110 Hypertensive heart disease with heart failure: Secondary | ICD-10-CM

## 2021-01-18 DIAGNOSIS — I25110 Atherosclerotic heart disease of native coronary artery with unstable angina pectoris: Secondary | ICD-10-CM

## 2021-01-18 NOTE — Progress Notes (Signed)
Updated blood pressure in chart with patient-reported home BP:  BP Readings from Last 3 Encounters:  01/18/21 (!) 114/42  01/11/21 (!) 160/70  01/05/21 (!) 184/77    Care Plan : General Pharmacy (Adult)  Updates made by Zettie Pho, RPH since 01/18/2021 12:00 AM     Problem: constipation, htn, hld   Priority: High  Onset Date: 04/09/2020     Long-Range Goal: disease management   Start Date: 04/09/2020  Expected End Date: 04/13/2021  Recent Progress: On track  Priority: High  Note:   Current Barriers:  Unable to achieve control of constipation   Pharmacist Clinical Goal(s):  Over the next 90 days, patient will achieve adherence to monitoring guidelines and medication adherence to achieve therapeutic efficacy adhere to prescribed medication regimen as evidenced by fill history and pill box usage through collaboration with PharmD and provider.    Interventions: 1:1 collaboration with [name], Fritzi Mandes, MD regarding development and update of comprehensive plan of care as evidenced by provider attestation and co-signature Inter-disciplinary care team collaboration (see longitudinal plan of care) Comprehensive medication review performed; medication list updated in electronic medical record  Hypertension (BP goal <140/90) BP Readings from Last 3 Encounters:  01/18/21 (!) 114/42  01/11/21 (!) 160/70  01/05/21 (!) 184/77  -controlled -Current treatment: losartan 25 mg daily  -Medications previously tried: carvedilol, clonidine, edarbi, lisinopril, metoprolol, bystolic -Current home readings: states "good" -Current dietary habits: eatings vegetables and fruit -Current exercise habits: stays active but too weak to walk like she did previously -Denies hypotensive/hypertensive symptoms -Educated on BP goals and benefits of medications for prevention of heart attack, stroke and kidney damage; Daily salt intake goal < 2300 mg; Exercise goal of 150 minutes per week; -Counseled to  monitor BP at home daily, document, and provide log at future appointments -Counseled on diet and exercise extensively Recommended to continue current medication November 2022: Updated Care Plan from BP readings  Hyperlipidemia: (LDL goal < 55) -uncontrolled -Current treatment: simvastatin 20 mg daily bedtime  -Medications previously tried: none reported  -Current dietary patterns: vegetables and fruit - can't tolerate meat well -Current exercise habits: stays active but not walking like before -Educated on Cholesterol goals;  Benefits of statin for ASCVD risk reduction; Importance of limiting foods high in cholesterol; Exercise goal of 150 minutes per week; -Counseled on diet and exercise extensively Recommended to continue current medication  Constipation (Goal: reduce stomach discomfort) -uncontrolled -Current treatment  miralax daily  Stool softener daily  Senna plus daily prn  -Medications previously tried: lactulose  -Counseled on diet and exercise extensively Counseled on fiber, hydration and activity to help with constipation.  Recommended adding ensure to diet for protein/nutrients since limited intake in diet wiht recent stomach flare. Discussed followin up with GI specialist to evaluate stomach pain and constipation again. Patient unable to tolerate lactulose due to sweet taste.    Patient Goals/Self-Care Activities Over the next 90 days, patient will:  - take medications as prescribed focus on medication adherence by using pill box Coordinating inhaler from patient assistance.   Follow Up Plan: Telephone follow up appointment with care management team member scheduled for:09/2020     Zettie Pho, [facility]

## 2021-01-19 DIAGNOSIS — I25110 Atherosclerotic heart disease of native coronary artery with unstable angina pectoris: Secondary | ICD-10-CM

## 2021-01-19 DIAGNOSIS — E782 Mixed hyperlipidemia: Secondary | ICD-10-CM

## 2021-01-19 DIAGNOSIS — I10 Essential (primary) hypertension: Secondary | ICD-10-CM

## 2021-01-20 ENCOUNTER — Ambulatory Visit: Payer: Medicare Other | Admitting: Family Medicine

## 2021-01-23 DIAGNOSIS — I110 Hypertensive heart disease with heart failure: Secondary | ICD-10-CM

## 2021-01-23 HISTORY — DX: Hypertensive heart disease with heart failure: I11.0

## 2021-01-23 NOTE — Assessment & Plan Note (Signed)
Bp not at goal. Increase losartan to 100 mg 1/2 twice a day.

## 2021-01-28 ENCOUNTER — Ambulatory Visit: Payer: Medicare Other | Admitting: Family Medicine

## 2021-02-04 ENCOUNTER — Telehealth: Payer: Self-pay

## 2021-02-04 NOTE — Progress Notes (Signed)
I have updated GAP report with the last at home BP and AWV and it says she is failing her A1C but I dont see any diabetic diagnosis or recent A1C so I have messaged the provider to get an updated one.  Roxana Hires, CMA Clinical Pharmacist Assistant  [phone]

## 2021-03-24 NOTE — Progress Notes (Signed)
Subjective:  Patient ID: Leah Pittman, female    DOB: [birth year]  Age: 88 y.o. MRN: [id number]  Chief Complaint  Patient presents with   Hypertension   Hyperlipidemia   Hypothyroidism   Hypertensive heart disease with heart failure: Current medications: Losartan 100mg  take 1/2 tablet twice daily, Aspirin 81 mg 1 tablet daily. Patient has not taken losartan this morning. Patient does not take second 1/2 dose if SBP around 100. BP has been elevated in am around 150-160s.   Hyperlipidemia: Current medications: Simvastatin 20mg  take 1 tablet daily  Secondary Hypothyroidism: Current medications: Levothyroxine 141mcg 1 tablet once daily  Poor sleep due to legs burning and cramping. Patient is not taking duloxetine 20 mg once daily. Did not help. Unable to get comfortable. Takes tylenol sometimes.   Poor appetite. She makes herself eat. She eats a lot of vegetables, but not much meat. She eats a lot of fruit.   Continues to have pain across chest. Not like heart attacks.  Gets a lot of mucous in her chest and coughs it up in am and chest feels better. Not using breztri. She took it for 2 weeks, but could not tell a different.  Gets DYSPNEA ON EXERTION still.   GERD: On pepcid 40 mg before bed and protonix 40 mg twice daily      Current Outpatient Medications on File Prior to Visit  Medication Sig Dispense Refill   albuterol (VENTOLIN HFA) 108 (90 Base) MCG/ACT inhaler Inhale 2 puffs into the lungs every 4 (four) hours as needed for wheezing or shortness of breath. 18 g 0   aspirin 81 MG tablet Take 81 mg by mouth daily.     Budeson-Glycopyrrol-Formoterol (BREZTRI AEROSPHERE) 160-9-4.8 MCG/ACT AERO Inhale 2 puffs into the lungs 2 (two) times daily as needed (for flares). (Patient not taking: Reported on 03/25/2021)     Cholecalciferol (VITAMIN D3 SUPER STRENGTH) 50 MCG (2000 UT) CAPS Take 2,000 Units by mouth daily after lunch.     dorzolamide-timolol (COSOPT) 22.3-6.8 MG/ML  ophthalmic solution Place 1 drop into the left eye 2 (two) times daily.      DULoxetine (CYMBALTA) 20 MG capsule Take 1 capsule (20 mg total) by mouth daily. (Patient not taking: Reported on 01/11/2021) 30 capsule 11   famotidine (PEPCID) 40 MG tablet TAKE ONE TABLET BY MOUTH ONCE DAILY AT NIGHT. (Patient taking differently: Take 40 mg by mouth at bedtime.) 90 tablet 1   levothyroxine (SYNTHROID) 100 MCG tablet TAKE 1 TABLET BY MOUTH DAILY BEFORE BREAKFAST. (Patient taking differently: Take 100 mcg by mouth daily before breakfast.) 90 tablet 1   losartan (COZAAR) 100 MG tablet Take 0.5 tablets (50 mg total) by mouth daily. 45 tablet 0   ondansetron (ZOFRAN) 8 MG tablet Take 8 mg by mouth 2 (two) times daily as needed for nausea or vomiting.     pantoprazole (PROTONIX) 40 MG tablet Take 1 tablet (40 mg total) by mouth 2 (two) times daily. (Patient taking differently: Take 40 mg by mouth See admin instructions. Take 40 mg by mouth in the morning before breakfast and 40,mg after lunch) 180 tablet 1   simvastatin (ZOCOR) 20 MG tablet TAKE 1 TABLET BY MOUTH EVERYDAY AT BEDTIME 90 tablet 0   Spacer/Aero-Holding Chambers (AEROCHAMBER Z-STAT PLUS/MEDIUM) inhaler Use as instructed 1 each 2   XELPROS 0.005 % EMUL Place 1 drop into the left eye at bedtime.     No current facility-administered medications on file prior to visit.   Past  Medical History:  Diagnosis Date   Anxiety    Arthritis    Chest pain 2011   CARDIOLITE - no significant symptoms, EKG changes, or arrhythmias   Congenital prolapse of bladder mucosa    Diverticulosis    Dyspnea 02/16/2012   2D ECHO - EF 55-60%, normal   Esophageal dysphagia    Fatigue    GERD (gastroesophageal reflux disease)    Glaucoma    Headache(784.0)    Heart attack ([place])    Hiatal hernia    High blood pressure 02/16/2012   RENAL DOPPLER - normal   Hyperlipidemia    Hypothyroidism    Labile hypertension    Lightheadedness    Migraine headache     Multiple allergies    Myalgia    NSTEMI (non-ST elevated myocardial infarction) ([place]) 07/14/2020   OSA (obstructive sleep apnea)    Pain, lower leg    Calf   Problem of menstruation    Proteinuria    Reflux    SOB (shortness of breath)    Thyroid disease    Urinary problem    Valvular heart disease    Vitamin B12 deficiency    Vitamin D deficiency    Past Surgical History:  Procedure Laterality Date   ABDOMINAL HYSTERECTOMY  1976   CHOLECYSTECTOMY  2000   COLONOSCOPY  07/27/2014   Moderate predominantly sigmoid divertuculosis. Otherwise normal colonoscopy   CORONARY ARTERY BYPASS GRAFT N/A 07/10/2018   Procedure: CORONARY ARTERY BYPASS GRAFTING (CABG), FREE LIMA;  Surgeon: Gaye Pollack, MD;  Location: [facility];  Service: Open Heart Surgery;  Laterality: N/A;   CYST REMOVAL NECK     CYSTECTOMY  1974   Intestine   CYSTECTOMY  1996   Brain stem   ESOPHAGOGASTRODUODENOSCOPY  12/07/2016   Schatzki's ring status post esophageal dilatation. Small hiatal hernia. Mild gastritis   EYE SURGERY  2003   EYE SURGERY  07/2016   LEFT HEART CATH AND CORONARY ANGIOGRAPHY N/A 07/08/2018   Procedure: LEFT HEART CATH AND CORONARY ANGIOGRAPHY;  Surgeon: Lorretta Harp, MD;  Location: [facility];  Service: Cardiovascular;  Laterality: N/A;   LEFT HEART CATH AND CORONARY ANGIOGRAPHY N/A 07/25/2019   Procedure: LEFT HEART CATH AND CORONARY ANGIOGRAPHY;  Surgeon: Burnell Blanks, MD;  Location: [facility];  Service: Cardiovascular;  Laterality: N/A;   MOUTH SURGERY  2013   RADIOLOGY WITH ANESTHESIA N/A 11/05/2019   Procedure: MRI WITH ANESTHESIA;  Surgeon: Radiologist, Medication, MD;  Location: [facility];  Service: Radiology;  Laterality: N/A;   TEE WITHOUT CARDIOVERSION N/A 07/10/2018   Procedure: TRANSESOPHAGEAL ECHOCARDIOGRAM (TEE);  Surgeon: Gaye Pollack, MD;  Location: [facility];  Service: Open Heart Surgery;  Laterality: N/A;   TONSILLECTOMY     age 62    Family History   Problem Relation Age of Onset   Other Mother        blood disorder - unsure of name   Heart attack Father    Stroke Father    Cancer Sister    Cancer Brother    Asthma Brother    Cancer Brother    Social History   Socioeconomic History   Marital status: Married    Spouse name: Not on file   Number of children: 3   Years of education: college   Highest education level: Not on file  Occupational History   Occupation: Retired  Tobacco Use   Smoking status: Never   Smokeless tobacco: Never  Vaping Use   Vaping Use: Never used  Substance and Sexual Activity   Alcohol use: No   Drug use: No   Sexual activity: Not on file  Other Topics Concern   Not on file  Social History Narrative   Lives with husband in [place], [place].   Right-handed.   No daily caffeine use.   Social Determinants of Health   Financial Resource Strain: Not on file  Food Insecurity: Not on file  Transportation Needs: Not on file  Physical Activity: Not on file  Stress: Not on file  Social Connections: Not on file    Review of Systems  Constitutional:  Negative for appetite change, fatigue and fever.  HENT:  Negative for congestion, ear pain, sinus pressure and sore throat.   Eyes:  Negative for pain.  Respiratory:  Positive for cough. Negative for chest tightness, shortness of breath and wheezing.   Cardiovascular:  Positive for chest pain. Negative for palpitations.  Gastrointestinal:  Positive for abdominal pain. Negative for constipation, diarrhea, nausea and vomiting.  Genitourinary:  Negative for dysuria and hematuria.  Musculoskeletal:  Negative for arthralgias, back pain, joint swelling and myalgias.  Skin:  Negative for rash.  Neurological:  Negative for dizziness, weakness and headaches.  Psychiatric/Behavioral:  Negative for dysphoric mood. The patient is not nervous/anxious.     Objective:  BP (!) 170/70    Pulse 65    Temp (!) 97 F (36.1 C)    Resp 18    Ht 5\' 4"  (1.626 m)    Wt  121 lb 9.6 oz (55.2 kg)    SpO2 100%    BMI 20.87 kg/m   BP/Weight 03/25/2021 01/18/2021 [id number]  Systolic BP 123XX123 [id number]  Diastolic BP 70 42 70  Wt. (Lbs) 121.6 - 122  BMI 20.87 - 20.94    Physical Exam Vitals reviewed.  Constitutional:      Appearance: Normal appearance. She is normal weight.  Neck:     Vascular: No carotid bruit.  Cardiovascular:     Rate and Rhythm: Normal rate and regular rhythm.     Pulses: Normal pulses.     Heart sounds: Normal heart sounds.  Pulmonary:     Effort: Pulmonary effort is normal. No respiratory distress.     Breath sounds: Normal breath sounds.  Abdominal:     General: Abdomen is flat.     Palpations: Abdomen is soft.     Tenderness: There is abdominal tenderness (epigastric (baseline)).  Neurological:     Mental Status: She is alert and oriented to person, place, and time.  Psychiatric:        Mood and Affect: Mood normal.        Behavior: Behavior normal.    Diabetic Foot Exam - Simple   No data filed      Lab Results  Component Value Date   WBC 8.7 03/25/2021   HGB 12.3 03/25/2021   HCT 36.7 03/25/2021   PLT 228 03/25/2021   GLUCOSE 91 03/25/2021   CHOL 171 03/25/2021   TRIG 74 03/25/2021   HDL 74 03/25/2021   LDLCALC 83 03/25/2021   ALT 15 03/25/2021   AST 18 03/25/2021   NA 139 03/25/2021   K 4.3 03/25/2021   CL 103 03/25/2021   CREATININE 0.91 03/25/2021   BUN 16 03/25/2021   CO2 22 03/25/2021   TSH 5.920 (H) 03/25/2021   INR 0.9 07/14/2020   HGBA1C 5.8 (H) 07/10/2018      Assessment &  Plan:   Problem List Items Addressed This Visit       Cardiovascular and Mediastinum   RESOLVED: Essential hypertension   Relevant Orders   CBC With Diff/Platelet (Completed)   Comprehensive metabolic panel (Completed)   Coronary artery disease involving native coronary artery of native heart with unstable angina pectoris ([place])    Continue zocor and aspirin. Management per specialist.        Hypertensive heart  disease with heart failure ([place]) - Primary    Patient to check bp athome.  Recommend take losartan 100 mg daily.         Digestive   GERD (gastroesophageal reflux disease)    The current medical regimen is effective;  continue present plan and medications.         Endocrine   Secondary hypothyroidism    Check tsh level.       Relevant Orders   TSH (Completed)     Other   Mixed hyperlipidemia    Well controlled.  No changes to medicines.  Continue to work on eating a healthy diet and exercise.  Labs drawn today.        Relevant Orders   Lipid panel (Completed)   Myalgia    Check labs      Relevant Medications   gabapentin (NEURONTIN) 100 MG capsule   Other Relevant Orders   Magnesium (Completed)   Phosphorus (Completed)   CK (Completed)  .  Meds ordered this encounter  Medications   gabapentin (NEURONTIN) 100 MG capsule    Sig: Take 1 capsule (100 mg total) by mouth at bedtime.    Dispense:  30 capsule    Refill:  2    Orders Placed This Encounter  Procedures   CBC With Diff/Platelet   Comprehensive metabolic panel   Lipid panel   TSH   Magnesium   Phosphorus   CK     Follow-up: Return in about 3 months (around 06/22/2021) for chronic fasting, awv with kim (due).  An After Visit Summary was printed and given to the patient.  Rochel Brome, MD [facility] [phone]

## 2021-03-25 ENCOUNTER — Other Ambulatory Visit: Payer: Self-pay

## 2021-03-25 ENCOUNTER — Encounter: Payer: Self-pay | Admitting: Family Medicine

## 2021-03-25 ENCOUNTER — Ambulatory Visit (INDEPENDENT_AMBULATORY_CARE_PROVIDER_SITE_OTHER): Payer: Medicare Other | Admitting: Family Medicine

## 2021-03-25 VITALS — BP 170/70 | HR 65 | Temp 97.0°F | Resp 18 | Ht 64.0 in | Wt 121.6 lb

## 2021-03-25 DIAGNOSIS — I25110 Atherosclerotic heart disease of native coronary artery with unstable angina pectoris: Secondary | ICD-10-CM | POA: Diagnosis not present

## 2021-03-25 DIAGNOSIS — E782 Mixed hyperlipidemia: Secondary | ICD-10-CM

## 2021-03-25 DIAGNOSIS — M791 Myalgia, unspecified site: Secondary | ICD-10-CM | POA: Insufficient documentation

## 2021-03-25 DIAGNOSIS — I110 Hypertensive heart disease with heart failure: Secondary | ICD-10-CM | POA: Diagnosis not present

## 2021-03-25 DIAGNOSIS — E038 Other specified hypothyroidism: Secondary | ICD-10-CM

## 2021-03-25 DIAGNOSIS — I10 Essential (primary) hypertension: Secondary | ICD-10-CM

## 2021-03-25 DIAGNOSIS — K219 Gastro-esophageal reflux disease without esophagitis: Secondary | ICD-10-CM

## 2021-03-25 HISTORY — DX: Mixed hyperlipidemia: E78.2

## 2021-03-25 MED ORDER — GABAPENTIN 100 MG PO CAPS: 100.0000 mg | ORAL_CAPSULE | Freq: Every day | ORAL | 2 refills | Status: DC

## 2021-03-25 NOTE — Patient Instructions (Signed)
Start gabapentin 100 mg before bed  Checked labs.

## 2021-03-26 LAB — CBC WITH DIFF/PLATELET
Basophils Absolute: 0.1 10*3/uL (ref 0.0–0.2)
Basos: 1 %
EOS (ABSOLUTE): 0.4 10*3/uL (ref 0.0–0.4)
Eos: 5 %
Hematocrit: 36.7 % (ref 34.0–46.6)
Hemoglobin: 12.3 g/dL (ref 11.1–15.9)
Immature Grans (Abs): 0 10*3/uL (ref 0.0–0.1)
Immature Granulocytes: 1 %
Lymphocytes Absolute: 2 10*3/uL (ref 0.7–3.1)
Lymphs: 23 %
MCH: 30.7 pg (ref 26.6–33.0)
MCHC: 33.5 g/dL (ref 31.5–35.7)
MCV: 92 fL (ref 79–97)
Monocytes Absolute: 0.9 10*3/uL (ref 0.1–0.9)
Monocytes: 11 %
Neutrophils Absolute: 5.2 10*3/uL (ref 1.4–7.0)
Neutrophils: 59 %
Platelets: 228 10*3/uL (ref 150–450)
RBC: 4.01 x10E6/uL (ref 3.77–5.28)
RDW: 14 % (ref 11.7–15.4)
WBC: 8.7 10*3/uL (ref 3.4–10.8)

## 2021-03-26 LAB — CK: Total CK: 79 U/L (ref 26–161)

## 2021-03-26 LAB — COMPREHENSIVE METABOLIC PANEL
ALT: 15 IU/L (ref 0–32)
AST: 18 IU/L (ref 0–40)
Albumin/Globulin Ratio: 1.7 (ref 1.2–2.2)
Albumin: 4.3 g/dL (ref 3.6–4.6)
Alkaline Phosphatase: 77 IU/L (ref 44–121)
BUN/Creatinine Ratio: 18 (ref 12–28)
BUN: 16 mg/dL (ref 8–27)
Bilirubin Total: 0.4 mg/dL (ref 0.0–1.2)
CO2: 22 mmol/L (ref 20–29)
Calcium: 9.8 mg/dL (ref 8.7–10.3)
Chloride: 103 mmol/L (ref 96–106)
Creatinine, Ser: 0.91 mg/dL (ref 0.57–1.00)
Globulin, Total: 2.5 g/dL (ref 1.5–4.5)
Glucose: 91 mg/dL (ref 70–99)
Potassium: 4.3 mmol/L (ref 3.5–5.2)
Sodium: 139 mmol/L (ref 134–144)
Total Protein: 6.8 g/dL (ref 6.0–8.5)
eGFR: 61 mL/min/{1.73_m2} (ref >59)

## 2021-03-26 LAB — LIPID PANEL
Chol/HDL Ratio: 2.3 ratio (ref 0.0–4.4)
Cholesterol, Total: 171 mg/dL (ref 100–199)
HDL: 74 mg/dL (ref >39)
LDL Chol Calc (NIH): 83 mg/dL (ref 0–99)
Triglycerides: 74 mg/dL (ref 0–149)
VLDL Cholesterol Cal: 14 mg/dL (ref 5–40)

## 2021-03-26 LAB — TSH: TSH: 5.92 u[IU]/mL — ABNORMAL HIGH (ref 0.450–4.500)

## 2021-03-26 LAB — PHOSPHORUS: Phosphorus: 3.5 mg/dL (ref 3.0–4.3)

## 2021-03-26 LAB — MAGNESIUM: Magnesium: 2.1 mg/dL (ref 1.6–2.3)

## 2021-03-27 ENCOUNTER — Encounter: Payer: Self-pay | Admitting: Family Medicine

## 2021-03-27 NOTE — Assessment & Plan Note (Addendum)
Check tsh level.

## 2021-03-27 NOTE — Progress Notes (Signed)
Blood count normal.  Liver function normal.  Kidney function normal.  Thyroid function abnormal. Increase levothyroxine to 112 mcg once daily in am.  Cholesterol: not quite at goal. Recommend increase simvastatin 40 mg before bed.  Magnesium, phosphorus, cpk normal.

## 2021-03-27 NOTE — Assessment & Plan Note (Signed)
Continue zocor and aspirin. Management per specialist.

## 2021-03-27 NOTE — Assessment & Plan Note (Signed)
The current medical regimen is effective;  continue present plan and medications.  

## 2021-03-27 NOTE — Assessment & Plan Note (Signed)
Check labs 

## 2021-03-27 NOTE — Assessment & Plan Note (Signed)
Well controlled.  ?No changes to medicines.  ?Continue to work on eating a healthy diet and exercise.  ?Labs drawn today.  ?

## 2021-03-27 NOTE — Assessment & Plan Note (Signed)
Patient to check bp athome.  Recommend take losartan 100 mg daily.

## 2021-03-28 ENCOUNTER — Ambulatory Visit: Payer: Medicare Other

## 2021-03-31 ENCOUNTER — Other Ambulatory Visit: Payer: Self-pay

## 2021-03-31 MED ORDER — LEVOTHYROXINE SODIUM 112 MCG PO TABS: 112.0000 ug | ORAL_TABLET | Freq: Every day | ORAL | 1 refills | Status: DC

## 2021-04-21 ENCOUNTER — Ambulatory Visit (INDEPENDENT_AMBULATORY_CARE_PROVIDER_SITE_OTHER): Payer: Medicare Other | Admitting: Family Medicine

## 2021-04-21 ENCOUNTER — Encounter: Payer: Self-pay | Admitting: Family Medicine

## 2021-04-21 ENCOUNTER — Other Ambulatory Visit: Payer: Self-pay

## 2021-04-21 VITALS — BP 148/78 | HR 88 | Temp 97.3°F | Resp 20 | Ht 64.0 in | Wt 118.0 lb

## 2021-04-21 DIAGNOSIS — R0609 Other forms of dyspnea: Secondary | ICD-10-CM

## 2021-04-21 DIAGNOSIS — J440 Chronic obstructive pulmonary disease with acute lower respiratory infection: Secondary | ICD-10-CM

## 2021-04-21 DIAGNOSIS — J9601 Acute respiratory failure with hypoxia: Secondary | ICD-10-CM

## 2021-04-21 DIAGNOSIS — J208 Acute bronchitis due to other specified organisms: Secondary | ICD-10-CM

## 2021-04-21 MED ORDER — AMOXICILLIN-POT CLAVULANATE 875-125 MG PO TABS: 1.0000 | ORAL_TABLET | Freq: Two times a day (BID) | ORAL | 0 refills | Status: DC

## 2021-04-21 NOTE — Progress Notes (Incomplete)
Acute Office Visit  Subjective:    Patient ID: Leah Pittman, female    DOB: [birth year], 88 y.o.   MRN: [id number]  Chief Complaint  Patient presents with   Cough   Shortness of Breath   Weakness    HPI: Patient is an 88 yo female with pmhx copd, cad who presents today for cough and nasal congestion for one month. It has improved, but not resolved. Patient was very nauseous. Poor appetite. No fever any more. Had one for 4 days. Patient has dyspnea with ambulation.  Had sore throat, but resolved. Nausea and cough improved, but not resolved. Denies edema.  Past Medical History:  Diagnosis Date   Anxiety    Arthritis    Chest pain 2011   CARDIOLITE - no significant symptoms, EKG changes, or arrhythmias   Congenital prolapse of bladder mucosa    Diverticulosis    Dyspnea 02/16/2012   2D ECHO - EF 55-60%, normal   Esophageal dysphagia    Fatigue    GERD (gastroesophageal reflux disease)    Glaucoma    Headache(784.0)    Heart attack ([place])    Hiatal hernia    High blood pressure 02/16/2012   RENAL DOPPLER - normal   Hyperlipidemia    Hypothyroidism    Labile hypertension    Lightheadedness    Migraine headache    Multiple allergies    Myalgia    NSTEMI (non-ST elevated myocardial infarction) ([place]) 07/14/2020   OSA (obstructive sleep apnea)    Pain, lower leg    Calf   Problem of menstruation    Proteinuria    Reflux    SOB (shortness of breath)    Thyroid disease    Urinary problem    Valvular heart disease    Vitamin B12 deficiency    Vitamin D deficiency     Past Surgical History:  Procedure Laterality Date   ABDOMINAL HYSTERECTOMY  1976   CHOLECYSTECTOMY  2000   COLONOSCOPY  07/27/2014   Moderate predominantly sigmoid divertuculosis. Otherwise normal colonoscopy   CORONARY ARTERY BYPASS GRAFT N/A 07/10/2018   Procedure: CORONARY ARTERY BYPASS GRAFTING (CABG), FREE LIMA;  Surgeon: Gaye Pollack, MD;  Location: [facility];  Service: Open Heart Surgery;  Laterality: N/A;   CYST REMOVAL NECK     CYSTECTOMY  1974   Intestine   CYSTECTOMY  1996   Brain stem   ESOPHAGOGASTRODUODENOSCOPY  12/07/2016   Schatzki's ring status post esophageal dilatation. Small hiatal hernia. Mild gastritis   EYE SURGERY  2003   EYE SURGERY  07/2016   LEFT HEART CATH AND CORONARY ANGIOGRAPHY N/A 07/08/2018   Procedure: LEFT HEART CATH AND CORONARY ANGIOGRAPHY;  Surgeon: Lorretta Harp, MD;  Location: [facility];  Service: Cardiovascular;  Laterality: N/A;   LEFT HEART CATH AND CORONARY ANGIOGRAPHY N/A 07/25/2019   Procedure: LEFT HEART CATH AND CORONARY ANGIOGRAPHY;  Surgeon: Burnell Blanks, MD;  Location: [facility];  Service: Cardiovascular;  Laterality: N/A;   MOUTH SURGERY  2013   RADIOLOGY WITH ANESTHESIA N/A 11/05/2019   Procedure: MRI WITH ANESTHESIA;  Surgeon: Radiologist, Medication, MD;  Location: [facility];  Service: Radiology;  Laterality: N/A;   TEE WITHOUT CARDIOVERSION N/A 07/10/2018   Procedure: TRANSESOPHAGEAL ECHOCARDIOGRAM (TEE);  Surgeon: Gaye Pollack, MD;  Location: [facility];  Service: Open Heart Surgery;  Laterality: N/A;   TONSILLECTOMY     age 57    Family History  Problem Relation Age  of Onset   Other Mother        blood disorder - unsure of name   Heart attack Father    Stroke Father    Cancer Sister    Cancer Brother    Asthma Brother    Cancer Brother     Social History   Socioeconomic History   Marital status: Married    Spouse name: Not on file   Number of children: 3   Years of education: college   Highest education level: Not on file  Occupational History   Occupation: Retired  Tobacco Use   Smoking status: Never   Smokeless tobacco: Never  Vaping Use   Vaping Use: Never used  Substance and Sexual Activity   Alcohol use: No   Drug use: No   Sexual activity: Not on file  Other Topics Concern   Not on file  Social History  Narrative   Lives with husband in [place], [place].   Right-handed.   No daily caffeine use.   Social Determinants of Health   Financial Resource Strain: Not on file  Food Insecurity: Not on file  Transportation Needs: Not on file  Physical Activity: Not on file  Stress: Not on file  Social Connections: Not on file  Intimate Partner Violence: Not on file    Outpatient Medications Prior to Visit  Medication Sig Dispense Refill   albuterol (VENTOLIN HFA) 108 (90 Base) MCG/ACT inhaler Inhale 2 puffs into the lungs every 4 (four) hours as needed for wheezing or shortness of breath. 18 g 0   aspirin 81 MG tablet Take 81 mg by mouth daily.     Budeson-Glycopyrrol-Formoterol (BREZTRI AEROSPHERE) 160-9-4.8 MCG/ACT AERO Inhale 2 puffs into the lungs 2 (two) times daily as needed (for flares). (Patient not taking: Reported on 03/25/2021)     Cholecalciferol (VITAMIN D3 SUPER STRENGTH) 50 MCG (2000 UT) CAPS Take 2,000 Units by mouth daily after lunch.     dorzolamide-timolol (COSOPT) 22.3-6.8 MG/ML ophthalmic solution Place 1 drop into the left eye 2 (two) times daily.      DULoxetine (CYMBALTA) 20 MG capsule Take 1 capsule (20 mg total) by mouth daily. (Patient not taking: Reported on 01/11/2021) 30 capsule 11   famotidine (PEPCID) 40 MG tablet TAKE ONE TABLET BY MOUTH ONCE DAILY AT NIGHT. (Patient taking differently: Take 40 mg by mouth at bedtime.) 90 tablet 1   gabapentin (NEURONTIN) 100 MG capsule Take 1 capsule (100 mg total) by mouth at bedtime. 30 capsule 2   levothyroxine (SYNTHROID) 112 MCG tablet Take 1 tablet (112 mcg total) by mouth daily. 90 tablet 1   losartan (COZAAR) 100 MG tablet Take 0.5 tablets (50 mg total) by mouth daily. 45 tablet 0   ondansetron (ZOFRAN) 8 MG tablet Take 8 mg by mouth 2 (two) times daily as needed for nausea or vomiting.     pantoprazole (PROTONIX) 40 MG tablet Take 1 tablet (40 mg total) by mouth 2 (two) times daily. (Patient taking differently: Take  40 mg by mouth See admin instructions. Take 40 mg by mouth in the morning before breakfast and 40,mg after lunch) 180 tablet 1   simvastatin (ZOCOR) 20 MG tablet TAKE 1 TABLET BY MOUTH EVERYDAY AT BEDTIME 90 tablet 0   Spacer/Aero-Holding Chambers (AEROCHAMBER Z-STAT PLUS/MEDIUM) inhaler Use as instructed 1 each 2   XELPROS 0.005 % EMUL Place 1 drop into the left eye at bedtime.     No facility-administered medications prior to visit.  Allergies  Allergen Reactions   Benadryl [Diphenhydramine] Swelling and Other (See Comments)    Tongue swells and hallucinates- could not talk   Lipase Concentrate-Hp [Digestive Enzymes] Rash   Zenpep [Pancrelipase (Lip-Prot-Amyl)] Rash   Codeine Nausea And Vomiting and Hypertension   Meperidine Nausea Only, Swelling and Other (See Comments)    Tongue swells and "I feel like death"   Other Other (See Comments)    SKIN IS VERY SENSITIVE AND IT TEARS EASILY!!   Prednisone Hypertension   Promethazine Other (See Comments)    Hypotension    Propranolol Nausea And Vomiting   Shellfish Allergy Nausea And Vomiting   Tape Other (See Comments)    Tears the skin   Tazarotene Other (See Comments)    Reaction not recalled- "Tazarotene, commonly marketed as Tazorac, Avage, and Zorac, is member of the acetylenic class of retinoids."    Iodinated Contrast Media Nausea Only and Rash    Review of Systems  Constitutional:  Positive for chills and fatigue. Negative for fever.  HENT:  Positive for congestion. Negative for rhinorrhea.   Respiratory:  Positive for cough and shortness of breath.   Gastrointestinal:  Positive for abdominal pain (epigastric pain) and nausea. Negative for vomiting.  Neurological:  Positive for weakness. Negative for headaches.      Objective:    Physical Exam Vitals reviewed.  Constitutional:      Appearance: Normal appearance.     Comments: thin  HENT:     Right Ear: Tympanic membrane, ear canal and external  ear normal.     Left Ear: Tympanic membrane, ear canal and external ear normal.     Nose: Congestion present.     Comments: Left nare. Left maxillary sinus tenderness.    Mouth/Throat:     Pharynx: Oropharynx is clear.  Cardiovascular:     Rate and Rhythm: Normal rate and regular rhythm.     Heart sounds: Normal heart sounds. No murmur heard. Pulmonary:     Effort: Pulmonary effort is normal. No respiratory distress.     Breath sounds: Wheezing (expiratory wheezes several lung fields.) present.  Neurological:     Mental Status: She is alert and oriented to person, place, and time.  Psychiatric:        Mood and Affect: Mood normal.        Behavior: Behavior normal.    BP (!) 148/78    Pulse 88    Temp (!) 97.3 F (36.3 C)    Resp 20    Ht $R'5\' 4"'yl$  (1.626 m)    Wt 118 lb (53.5 kg)    SpO2 100%    BMI 20.25 kg/m  Wt Readings from Last 3 Encounters:  04/21/21 118 lb (53.5 kg)  03/25/21 121 lb 9.6 oz (55.2 kg)  01/11/21 122 lb (55.3 kg)    Health Maintenance Due  Topic Date Due   Zoster Vaccines- Shingrix (1 of 2) Never done   Pneumonia Vaccine 6+ Years old (1 - PCV) Never done   INFLUENZA VACCINE  Never done    There are no preventive care reminders to display for this patient.   Lab Results  Component Value Date   TSH 5.920 (H) 03/25/2021   Lab Results  Component Value Date   WBC 8.7 03/25/2021   HGB 12.3 03/25/2021   HCT 36.7 03/25/2021   MCV 92 03/25/2021   PLT 228 03/25/2021   Lab Results  Component Value Date   NA 139 03/25/2021   K 4.3  03/25/2021   CO2 22 03/25/2021   GLUCOSE 91 03/25/2021   BUN 16 03/25/2021   CREATININE 0.91 03/25/2021   BILITOT 0.4 03/25/2021   ALKPHOS 77 03/25/2021   AST 18 03/25/2021   ALT 15 03/25/2021   PROT 6.8 03/25/2021   ALBUMIN 4.3 03/25/2021   CALCIUM 9.8 03/25/2021   ANIONGAP 7 01/05/2021   EGFR 61 03/25/2021   GFR 60.94 01/27/2020   Lab Results  Component Value Date   CHOL 171 03/25/2021   Lab Results   Component Value Date   HDL 74 03/25/2021   Lab Results  Component Value Date   LDLCALC 83 03/25/2021   Lab Results  Component Value Date   TRIG 74 03/25/2021   Lab Results  Component Value Date   CHOLHDL 2.3 03/25/2021   Lab Results  Component Value Date   HGBA1C 5.8 (H) 07/10/2018       Assessment & Plan:   Problem List Items Addressed This Visit       Respiratory   Chronic obstructive pulmonary disease ([place])   Relevant Orders   PR PORTABLE OXYGEN CONCENTRATOR   Other Visit Diagnoses     Acute bronchitis due to other specified organisms    -  Primary   Relevant Orders   DG Chest 2 View   Acute respiratory failure with hypoxia (HCC)       Relevant Orders   PR PORTABLE OXYGEN CONCENTRATOR   CBC   Comprehensive metabolic panel   Brain natriuretic peptide   Dyspnea on exertion       Relevant Orders   Brain natriuretic peptide      Meds ordered this encounter  Medications   amoxicillin-clavulanate (AUGMENTIN) 875-125 MG tablet    Sig: Take 1 tablet by mouth 2 (two) times daily.    Dispense:  20 tablet    Refill:  0    Orders Placed This Encounter  Procedures   DG Chest 2 View   CBC   Comprehensive metabolic panel   Brain natriuretic peptide   PR PORTABLE OXYGEN CONCENTRATOR     Follow-up: No follow-ups on file.  An After Visit Summary was printed and given to the patient.  Rochel Brome, MD [facility] [phone]

## 2021-04-21 NOTE — Patient Instructions (Addendum)
COPD FLARE/Bronchitis/Pneumonia: ?Breztri 2 puffs twice daily.  Rinse mouth out after use.  Recommend use spacer with this. ?2. Augmentin 1 tablet twice daily for 10 days ?3. Use ProAir inhaler 2 puffs every 4-6 hours as needed for shortness of breath.  Recommend use spacer with this. ?4. Wear oxygen 2 Liters continuously.  ? ?Go to get chest xray at: ?[facility] ?[address] ?[place], [place] [address] ? ?OR ? ?Go to [facility]: ?[address] ?[place], [place] [address] ?Phone [phone] ? ?

## 2021-04-22 LAB — CBC
Hematocrit: 37.6 % (ref 34.0–46.6)
Hemoglobin: 12.7 g/dL (ref 11.1–15.9)
MCH: 30.8 pg (ref 26.6–33.0)
MCHC: 33.8 g/dL (ref 31.5–35.7)
MCV: 91 fL (ref 79–97)
Platelets: 281 10*3/uL (ref 150–450)
RBC: 4.13 x10E6/uL (ref 3.77–5.28)
RDW: 14.2 % (ref 11.7–15.4)
WBC: 8.4 10*3/uL (ref 3.4–10.8)

## 2021-04-22 LAB — COMPREHENSIVE METABOLIC PANEL
ALT: 15 IU/L (ref 0–32)
AST: 14 IU/L (ref 0–40)
Albumin/Globulin Ratio: 2.3 — ABNORMAL HIGH (ref 1.2–2.2)
Albumin: 4.6 g/dL (ref 3.6–4.6)
Alkaline Phosphatase: 73 IU/L (ref 44–121)
BUN/Creatinine Ratio: 16 (ref 12–28)
BUN: 13 mg/dL (ref 8–27)
Bilirubin Total: 0.3 mg/dL (ref 0.0–1.2)
CO2: 23 mmol/L (ref 20–29)
Calcium: 10.2 mg/dL (ref 8.7–10.3)
Chloride: 103 mmol/L (ref 96–106)
Creatinine, Ser: 0.8 mg/dL (ref 0.57–1.00)
Globulin, Total: 2 g/dL (ref 1.5–4.5)
Glucose: 88 mg/dL (ref 70–99)
Potassium: 4.7 mmol/L (ref 3.5–5.2)
Sodium: 138 mmol/L (ref 134–144)
Total Protein: 6.6 g/dL (ref 6.0–8.5)
eGFR: 71 mL/min/{1.73_m2} (ref >59)

## 2021-04-22 LAB — BRAIN NATRIURETIC PEPTIDE: BNP: 177.4 pg/mL — ABNORMAL HIGH (ref 0.0–100.0)

## 2021-04-25 DIAGNOSIS — R0602 Shortness of breath: Secondary | ICD-10-CM | POA: Insufficient documentation

## 2021-04-25 DIAGNOSIS — J208 Acute bronchitis due to other specified organisms: Secondary | ICD-10-CM | POA: Insufficient documentation

## 2021-04-25 DIAGNOSIS — J9601 Acute respiratory failure with hypoxia: Secondary | ICD-10-CM | POA: Insufficient documentation

## 2021-04-25 DIAGNOSIS — R0609 Other forms of dyspnea: Secondary | ICD-10-CM | POA: Insufficient documentation

## 2021-04-27 ENCOUNTER — Other Ambulatory Visit: Payer: Self-pay

## 2021-04-27 DIAGNOSIS — J208 Acute bronchitis due to other specified organisms: Secondary | ICD-10-CM

## 2021-04-28 ENCOUNTER — Ambulatory Visit (INDEPENDENT_AMBULATORY_CARE_PROVIDER_SITE_OTHER): Payer: Medicare Other | Admitting: Family Medicine

## 2021-04-28 ENCOUNTER — Other Ambulatory Visit: Payer: Self-pay

## 2021-04-28 VITALS — BP 136/64 | HR 70 | Temp 97.4°F | Resp 18 | Ht 64.0 in | Wt 119.0 lb

## 2021-04-28 DIAGNOSIS — R490 Dysphonia: Secondary | ICD-10-CM

## 2021-04-28 DIAGNOSIS — J440 Chronic obstructive pulmonary disease with acute lower respiratory infection: Secondary | ICD-10-CM

## 2021-04-28 DIAGNOSIS — R519 Headache, unspecified: Secondary | ICD-10-CM | POA: Insufficient documentation

## 2021-04-28 DIAGNOSIS — G441 Vascular headache, not elsewhere classified: Secondary | ICD-10-CM | POA: Diagnosis not present

## 2021-04-28 DIAGNOSIS — E039 Hypothyroidism, unspecified: Secondary | ICD-10-CM | POA: Insufficient documentation

## 2021-04-28 HISTORY — DX: Hypothyroidism, unspecified: E03.9

## 2021-04-28 LAB — SEDIMENTATION RATE: Sed Rate: 8 mm/hr (ref 0–40)

## 2021-04-28 MED ORDER — BREZTRI AEROSPHERE 160-9-4.8 MCG/ACT IN AERO: 2.0000 | INHALATION_SPRAY | Freq: Two times a day (BID) | RESPIRATORY_TRACT | 11 refills | Status: DC | PRN

## 2021-04-28 NOTE — Progress Notes (Incomplete)
Acute Office Visit  Subjective:    Patient ID: Leah Pittman, female    DOB: 12-23-1930, 88 y.o.   MRN: [id number]  Chief Complaint  Patient presents with   Shortness of Breath    HPI: Patient is in today for Follow up of dyspnea. Patient was treated for copd flare. Given breztri 2 puffs twice daily, augmentin one twice daily, and proair inhaler 2 puffs four times a day as needed - use spacer.  Given oxygen 2 L continuously. Qualified for oxygen with ambulation, but patient's daughter was concerned she would trip over oxygen tubing. Her cxray was normal. BNP was up a little. Cbc normal and cmp normal.   Complaining of right sided of head hurting and eye pain.   Past Medical History:  Diagnosis Date   Anxiety    Arthritis    Chest pain 2011   CARDIOLITE - no significant symptoms, EKG changes, or arrhythmias   Congenital prolapse of bladder mucosa    Diverticulosis    Dyspnea 02/16/2012   2D ECHO - EF 55-60%, normal   Esophageal dysphagia    Fatigue    GERD (gastroesophageal reflux disease)    Glaucoma    Headache(784.0)    Heart attack ([place])    Hiatal hernia    High blood pressure 02/16/2012   RENAL DOPPLER - normal   Hyperlipidemia    Hypothyroidism    Labile hypertension    Lightheadedness    Migraine headache    Multiple allergies    Myalgia    NSTEMI (non-ST elevated myocardial infarction) ([place]) 07/14/2020   OSA (obstructive sleep apnea)    Pain, lower leg    Calf   Problem of menstruation    Proteinuria    Reflux    SOB (shortness of breath)    Thyroid disease    Urinary problem    Valvular heart disease    Vitamin B12 deficiency    Vitamin D deficiency     Past Surgical History:  Procedure Laterality Date   ABDOMINAL HYSTERECTOMY  1976   CHOLECYSTECTOMY  2000   COLONOSCOPY  07/27/2014   Moderate predominantly sigmoid divertuculosis. Otherwise normal colonoscopy   CORONARY ARTERY BYPASS GRAFT N/A 07/10/2018    Procedure: CORONARY ARTERY BYPASS GRAFTING (CABG), FREE LIMA;  Surgeon: Gaye Pollack, MD;  Location: [facility];  Service: Open Heart Surgery;  Laterality: N/A;   CYST REMOVAL NECK     CYSTECTOMY  1974   Intestine   CYSTECTOMY  1996   Brain stem   ESOPHAGOGASTRODUODENOSCOPY  12/07/2016   Schatzki's ring status post esophageal dilatation. Small hiatal hernia. Mild gastritis   EYE SURGERY  2003   EYE SURGERY  07/2016   LEFT HEART CATH AND CORONARY ANGIOGRAPHY N/A 07/08/2018   Procedure: LEFT HEART CATH AND CORONARY ANGIOGRAPHY;  Surgeon: Lorretta Harp, MD;  Location: [facility];  Service: Cardiovascular;  Laterality: N/A;   LEFT HEART CATH AND CORONARY ANGIOGRAPHY N/A 07/25/2019   Procedure: LEFT HEART CATH AND CORONARY ANGIOGRAPHY;  Surgeon: Burnell Blanks, MD;  Location: [facility];  Service: Cardiovascular;  Laterality: N/A;   MOUTH SURGERY  2013   RADIOLOGY WITH ANESTHESIA N/A 11/05/2019   Procedure: MRI WITH ANESTHESIA;  Surgeon: Radiologist, Medication, MD;  Location: [facility];  Service: Radiology;  Laterality: N/A;   TEE WITHOUT CARDIOVERSION N/A 07/10/2018   Procedure: TRANSESOPHAGEAL ECHOCARDIOGRAM (TEE);  Surgeon: Gaye Pollack, MD;  Location: [facility];  Service: Open Heart Surgery;  Laterality: N/A;   TONSILLECTOMY     age 89    Family History  Problem Relation Age of Onset   Other Mother        blood disorder - unsure of name   Heart attack Father    Stroke Father    Cancer Sister    Cancer Brother    Asthma Brother    Cancer Brother     Social History   Socioeconomic History   Marital status: Married    Spouse name: Not on file   Number of children: 3   Years of education: college   Highest education level: Not on file  Occupational History   Occupation: Retired  Tobacco Use   Smoking status: Never   Smokeless tobacco: Never  Scientific laboratory technician Use: Never used  Substance and Sexual Activity   Alcohol use:  No   Drug use: No   Sexual activity: Not on file  Other Topics Concern   Not on file  Social History Narrative   Lives with husband in [place], [place].   Right-handed.   No daily caffeine use.   Social Determinants of Health   Financial Resource Strain: Not on file  Food Insecurity: Not on file  Transportation Needs: Not on file  Physical Activity: Not on file  Stress: Not on file  Social Connections: Not on file  Intimate Partner Violence: Not on file    Outpatient Medications Prior to Visit  Medication Sig Dispense Refill   albuterol (VENTOLIN HFA) 108 (90 Base) MCG/ACT inhaler Inhale 2 puffs into the lungs every 4 (four) hours as needed for wheezing or shortness of breath. 18 g 0   amoxicillin-clavulanate (AUGMENTIN) 875-125 MG tablet Take 1 tablet by mouth 2 (two) times daily. 20 tablet 0   aspirin 81 MG tablet Take 81 mg by mouth daily.     Cholecalciferol (VITAMIN D3 SUPER STRENGTH) 50 MCG (2000 UT) CAPS Take 2,000 Units by mouth daily after lunch.     dorzolamide-timolol (COSOPT) 22.3-6.8 MG/ML ophthalmic solution Place 1 drop into the left eye 2 (two) times daily.      DULoxetine (CYMBALTA) 20 MG capsule Take 1 capsule (20 mg total) by mouth daily. (Patient not taking: Reported on 01/11/2021) 30 capsule 11   famotidine (PEPCID) 40 MG tablet TAKE ONE TABLET BY MOUTH ONCE DAILY AT NIGHT. (Patient taking differently: Take 40 mg by mouth at bedtime.) 90 tablet 1   gabapentin (NEURONTIN) 100 MG capsule Take 1 capsule (100 mg total) by mouth at bedtime. 30 capsule 2   levothyroxine (SYNTHROID) 112 MCG tablet Take 1 tablet (112 mcg total) by mouth daily. 90 tablet 1   losartan (COZAAR) 100 MG tablet Take 0.5 tablets (50 mg total) by mouth daily. 45 tablet 0   ondansetron (ZOFRAN) 8 MG tablet Take 8 mg by mouth 2 (two) times daily as needed for nausea or vomiting.     pantoprazole (PROTONIX) 40 MG tablet Take 1 tablet (40 mg total) by mouth 2 (two) times daily. (Patient  taking differently: Take 40 mg by mouth See admin instructions. Take 40 mg by mouth in the morning before breakfast and 40,mg after lunch) 180 tablet 1   simvastatin (ZOCOR) 20 MG tablet TAKE 1 TABLET BY MOUTH EVERYDAY AT BEDTIME 90 tablet 0   Spacer/Aero-Holding Chambers (AEROCHAMBER Z-STAT PLUS/MEDIUM) inhaler Use as instructed 1 each 2   XELPROS 0.005 % EMUL Place 1 drop into the left eye at bedtime.  Budeson-Glycopyrrol-Formoterol (BREZTRI AEROSPHERE) 160-9-4.8 MCG/ACT AERO Inhale 2 puffs into the lungs 2 (two) times daily as needed (for flares). (Patient not taking: Reported on 03/25/2021)     No facility-administered medications prior to visit.    Allergies  Allergen Reactions   Benadryl [Diphenhydramine] Swelling and Other (See Comments)    Tongue swells and hallucinates- could not talk   Lipase Concentrate-Hp [Digestive Enzymes] Rash   Zenpep [Pancrelipase (Lip-Prot-Amyl)] Rash   Codeine Nausea And Vomiting and Hypertension   Meperidine Nausea Only, Swelling and Other (See Comments)    Tongue swells and "I feel like death"   Other Other (See Comments)    SKIN IS VERY SENSITIVE AND IT TEARS EASILY!!   Prednisone Hypertension   Promethazine Other (See Comments)    Hypotension    Propranolol Nausea And Vomiting   Shellfish Allergy Nausea And Vomiting   Tape Other (See Comments)    Tears the skin   Tazarotene Other (See Comments)    Reaction not recalled- "Tazarotene, commonly marketed as Tazorac, Avage, and Zorac, is member of the acetylenic class of retinoids."    Iodinated Contrast Media Nausea Only and Rash    Review of Systems  Constitutional:  Positive for fatigue. Negative for chills and fever.  HENT:  Positive for congestion. Negative for rhinorrhea and sore throat.        Hoarseness   Respiratory:  Positive for cough and shortness of breath.   Cardiovascular:  Positive for chest pain (right-sided chest pain. aches. usually at night in bed.  improves with changing position.For weeks. Due for cardiology visit.).  Gastrointestinal:  Negative for abdominal pain, constipation, diarrhea, nausea and vomiting.  Genitourinary:  Negative for dysuria and urgency.  Musculoskeletal:  Negative for back pain and myalgias.  Neurological:  Positive for headaches. Negative for dizziness, weakness and light-headedness.  Psychiatric/Behavioral:  Negative for dysphoric mood. The patient is not nervous/anxious.       Objective:    Physical Exam Vitals reviewed.  Constitutional:      Appearance: Normal appearance. She is well-developed.  HENT:     Mouth/Throat:     Comments: hoarse Eyes:     Comments: Tender over right temple  Cardiovascular:     Rate and Rhythm: Normal rate and regular rhythm.     Heart sounds: Normal heart sounds.  Pulmonary:     Effort: Pulmonary effort is normal. No respiratory distress.     Breath sounds: Normal breath sounds.  Neurological:     Mental Status: She is alert and oriented to person, place, and time.  Psychiatric:        Mood and Affect: Mood normal.        Behavior: Behavior normal.    BP 136/64    Pulse 70    Temp (!) 97.4 F (36.3 C)    Resp 18    Ht _0  (1.626 m)    Wt 119 lb (54 kg)    BMI 20.43 kg/m  Wt Readings from Last 3 Encounters:  04/28/21 119 lb (54 kg)  04/21/21 118 lb (53.5 kg)  03/25/21 121 lb 9.6 oz (55.2 kg)    Health Maintenance Due  Topic Date Due   Zoster Vaccines- Shingrix (1 of 2) Never done   Pneumonia Vaccine 78+ Years old (1 - PCV) Never done   INFLUENZA VACCINE  Never done    There are no preventive care reminders to display for this patient.   Lab Results  Component Value Date  TSH 5.920 (H) 03/25/2021   Lab Results  Component Value Date   WBC 8.4 04/21/2021   HGB 12.7 04/21/2021   HCT 37.6 04/21/2021   MCV 91 04/21/2021   PLT 281 04/21/2021   Lab Results  Component Value Date   NA 138 04/21/2021   K 4.7 04/21/2021   CO2 23 04/21/2021    GLUCOSE 88 04/21/2021   BUN 13 04/21/2021   CREATININE 0.80 04/21/2021   BILITOT 0.3 04/21/2021   ALKPHOS 73 04/21/2021   AST 14 04/21/2021   ALT 15 04/21/2021   PROT 6.6 04/21/2021   ALBUMIN 4.6 04/21/2021   CALCIUM 10.2 04/21/2021   ANIONGAP 7 01/05/2021   EGFR 71 04/21/2021   GFR 60.94 01/27/2020   Lab Results  Component Value Date   CHOL 171 03/25/2021   Lab Results  Component Value Date   HDL 74 03/25/2021   Lab Results  Component Value Date   LDLCALC 83 03/25/2021   Lab Results  Component Value Date   TRIG 74 03/25/2021   Lab Results  Component Value Date   CHOLHDL 2.3 03/25/2021   Lab Results  Component Value Date   HGBA1C 5.8 (H) 07/10/2018       Assessment & Plan:   Problem List Items Addressed This Visit       Respiratory   Chronic obstructive pulmonary disease with acute lower respiratory infection ([place])   Relevant Medications   Budeson-Glycopyrrol-Formoterol (BREZTRI AEROSPHERE) 160-9-4.8 MCG/ACT AERO     Endocrine   Acquired hypothyroidism   Relevant Orders   T4, free   TSH     Other   Hoarseness   Cephalalgia - Primary   Relevant Orders   Sedimentation rate   Meds ordered this encounter  Medications   Budeson-Glycopyrrol-Formoterol (BREZTRI AEROSPHERE) 160-9-4.8 MCG/ACT AERO    Sig: Inhale 2 puffs into the lungs 2 (two) times daily as needed (for flares).    Dispense:  10.7 g    Refill:  11    Orders Placed This Encounter  Procedures   T4, free   TSH   Sedimentation rate     Follow-up: No follow-ups on file.  An After Visit Summary was printed and given to the patient.  Rochel Brome, MD [facility] [phone]

## 2021-04-28 NOTE — Patient Instructions (Signed)
Recommend mucinex over the counter.  ?Continue breztri 2 puffs twice daily.  ?

## 2021-04-29 ENCOUNTER — Telehealth: Payer: Self-pay

## 2021-04-29 LAB — T4, FREE: Free T4: 1.8 ng/dL — ABNORMAL HIGH (ref 0.82–1.77)

## 2021-04-29 LAB — TSH: TSH: 0.435 u[IU]/mL — ABNORMAL LOW (ref 0.450–4.500)

## 2021-04-29 NOTE — Telephone Encounter (Signed)
Patient notified of normal sed rate.  ?

## 2021-05-06 ENCOUNTER — Other Ambulatory Visit: Payer: Self-pay | Admitting: Physician Assistant

## 2021-05-07 NOTE — Assessment & Plan Note (Signed)
Order ProBNP. ?

## 2021-05-07 NOTE — Assessment & Plan Note (Signed)
COPD FLARE/Bronchitis/Pneumonia: ?1. Breztri 2 puffs twice daily.  Rinse mouth out after use.  Recommend use spacer with this. ?2. Augmentin 1 tablet twice daily for 10 days ?3. Use ProAir inhaler 2 puffs every 4-6 hours as needed for shortness of breath.  Recommend use spacer with this. ?4. Wear oxygen 2 Liters continuously.  ?

## 2021-05-07 NOTE — Assessment & Plan Note (Signed)
Augmentin.  ?Order cxr.  ? ?

## 2021-05-07 NOTE — Assessment & Plan Note (Signed)
Ordered Oxygen at 2 L. Sent a portable oxygen concentrator home provided by [facility]. ?

## 2021-05-10 ENCOUNTER — Ambulatory Visit: Payer: Medicare Other

## 2021-05-11 ENCOUNTER — Ambulatory Visit (INDEPENDENT_AMBULATORY_CARE_PROVIDER_SITE_OTHER): Payer: Medicare Other

## 2021-05-11 VITALS — BP 128/64 | HR 72 | Resp 18 | Ht 64.0 in | Wt 120.2 lb

## 2021-05-11 DIAGNOSIS — Z0000 Encounter for general adult medical examination without abnormal findings: Secondary | ICD-10-CM

## 2021-05-11 NOTE — Patient Instructions (Signed)

## 2021-05-11 NOTE — Progress Notes (Signed)
? ?Subjective:  ? Leah Pittman is a 88 y.o. female who presents for Medicare Annual (Subsequent) preventive examination.  This wellness visit is conducted by a nurse.  The patient's medications were reviewed and reconciled since the patient's last visit.  History details were provided by the patient.  The history appears to be reliable.   ? ?Patient's last AWV was one year ago.   ?Medical History: Patient history and Family history was reviewed  ?Medications, Allergies, and preventative health maintenance was reviewed and updated. ? ? ?Cardiac Risk Factors include: advanced age (>58men, >81 women) ? ?   ?Objective:  ?  ?Today's Vitals  ? 05/11/21 1620  ?BP: 128/64  ?Pulse: 72  ?Resp: 18  ?SpO2: 92%  ?Weight: 120 lb 3.2 oz (54.5 kg)  ?Height: 5\' 4"  (1.626 m)  ?PainSc: 0-No pain  ? ?Body mass index is 20.63 kg/m?. ? ? ?  01/05/2021  ? 12:05 PM 11/02/2020  ?  6:30 PM 07/14/2020  ?  1:38 PM 11/06/2019  ?  3:25 PM 11/05/2019  ?  9:58 AM 11/03/2019  ?  2:00 PM 07/24/2019  ?  7:21 PM  ?Advanced Directives  ?Does Patient Have a Medical Advance Directive? No No No  No No No  ?Would patient like information on creating a medical advance directive? No - Patient declined No - Guardian declined No - Patient declined No - Patient declined  No - Patient declined No - Patient declined  ? ? ?Current Medications (verified) ?Outpatient Encounter Medications as of 05/11/2021  ?Medication Sig  ? albuterol (VENTOLIN HFA) 108 (90 Base) MCG/ACT inhaler Inhale 2 puffs into the lungs every 4 (four) hours as needed for wheezing or shortness of breath.  ? aspirin 81 MG tablet Take 81 mg by mouth daily.  ? Budeson-Glycopyrrol-Formoterol (BREZTRI AEROSPHERE) 160-9-4.8 MCG/ACT AERO Inhale 2 puffs into the lungs 2 (two) times daily as needed (for flares).  ? Cholecalciferol (VITAMIN D3 SUPER STRENGTH) 50 MCG (2000 UT) CAPS Take 2,000 Units by mouth daily after lunch.  ? dorzolamide-timolol (COSOPT) 22.3-6.8 MG/ML ophthalmic solution Place 1 drop into  the left eye 2 (two) times daily.   ? famotidine (PEPCID) 40 MG tablet TAKE ONE TABLET BY MOUTH ONCE DAILY AT NIGHT. (Patient taking differently: Take 40 mg by mouth at bedtime.)  ? levothyroxine (SYNTHROID) 112 MCG tablet Take 1 tablet (112 mcg total) by mouth daily.  ? losartan (COZAAR) 100 MG tablet Take 0.5 tablets (50 mg total) by mouth daily.  ? pantoprazole (PROTONIX) 40 MG tablet Take 1 tablet (40 mg total) by mouth 2 (two) times daily. (Patient taking differently: Take 40 mg by mouth See admin instructions. Take 40 mg by mouth in the morning before breakfast and 40,mg after lunch)  ? simvastatin (ZOCOR) 20 MG tablet TAKE 1 TABLET BY MOUTH EVERYDAY AT BEDTIME  ? Spacer/Aero-Holding Chambers (AEROCHAMBER Z-STAT PLUS/MEDIUM) inhaler Use as instructed  ? XELPROS 0.005 % EMUL Place 1 drop into the left eye at bedtime.  ? [DISCONTINUED] amoxicillin-clavulanate (AUGMENTIN) 875-125 MG tablet Take 1 tablet by mouth 2 (two) times daily.  ? [DISCONTINUED] ondansetron (ZOFRAN) 8 MG tablet Take 8 mg by mouth 2 (two) times daily as needed for nausea or vomiting.  ? DULoxetine (CYMBALTA) 20 MG capsule Take 1 capsule (20 mg total) by mouth daily. (Patient not taking: Reported on 05/11/2021)  ? gabapentin (NEURONTIN) 100 MG capsule Take 1 capsule (100 mg total) by mouth at bedtime. (Patient not taking: Reported on 05/11/2021)  ? ?No  facility-administered encounter medications on file as of 05/11/2021.  ? ? ?Allergies (verified) ?Benadryl [diphenhydramine], Lipase concentrate-hp [digestive enzymes], Zenpep [pancrelipase (lip-prot-amyl)], Codeine, Meperidine, Other, Prednisone, Promethazine, Propranolol, Shellfish allergy, Tape, Tazarotene, and Iodinated contrast media  ? ?History: ?Past Medical History:  ?Diagnosis Date  ? Anxiety   ? Arthritis   ? Chest pain 2011  ? CARDIOLITE - no significant symptoms, EKG changes, or arrhythmias  ? Congenital prolapse of bladder mucosa   ? Diverticulosis   ? Dyspnea 02/16/2012  ? 2D ECHO -  EF 55-60%, normal  ? Esophageal dysphagia   ? Fatigue   ? GERD (gastroesophageal reflux disease)   ? Glaucoma   ? Headache(784.0)   ? Heart attack ([facility])   ? Hiatal hernia   ? High blood pressure 02/16/2012  ? RENAL DOPPLER - normal  ? Hyperlipidemia   ? Hypothyroidism   ? Labile hypertension   ? Lightheadedness   ? Migraine headache   ? Multiple allergies   ? Myalgia   ? NSTEMI (non-ST elevated myocardial infarction) ([facility]) 07/14/2020  ? OSA (obstructive sleep apnea)   ? Pain, lower leg   ? Calf  ? Problem of menstruation   ? Proteinuria   ? Reflux   ? SOB (shortness of breath)   ? Thyroid disease   ? Urinary problem   ? Valvular heart disease   ? Vitamin B12 deficiency   ? Vitamin D deficiency   ? ?Past Surgical History:  ?Procedure Laterality Date  ? ABDOMINAL HYSTERECTOMY  1976  ? CHOLECYSTECTOMY  2000  ? COLONOSCOPY  07/27/2014  ? Moderate predominantly sigmoid divertuculosis. Otherwise normal colonoscopy  ? CORONARY ARTERY BYPASS GRAFT N/A 07/10/2018  ? Procedure: CORONARY ARTERY BYPASS GRAFTING (CABG), FREE LIMA;  Surgeon: [name], MD;  Location: [facility];  Service: Open Heart Surgery;  Laterality: N/A;  ? CYST REMOVAL NECK    ? CYSTECTOMY  1974  ? Intestine  ? CYSTECTOMY  1996  ? Brain stem  ? ESOPHAGOGASTRODUODENOSCOPY  12/07/2016  ? Schatzki's ring status post esophageal dilatation. Small hiatal hernia. Mild gastritis  ? EYE SURGERY  2003  ? EYE SURGERY  07/2016  ? LEFT HEART CATH AND CORONARY ANGIOGRAPHY N/A 07/08/2018  ? Procedure: LEFT HEART CATH AND CORONARY ANGIOGRAPHY;  Surgeon: Runell Gess, MD;  Location: [facility];  Service: Cardiovascular;  Laterality: N/A;  ? LEFT HEART CATH AND CORONARY ANGIOGRAPHY N/A 07/25/2019  ? Procedure: LEFT HEART CATH AND CORONARY ANGIOGRAPHY;  Surgeon: Kathleene Hazel, MD;  Location: [facility];  Service: Cardiovascular;  Laterality: N/A;  ? MOUTH SURGERY  2013  ? RADIOLOGY WITH ANESTHESIA N/A 11/05/2019  ? Procedure: MRI WITH ANESTHESIA;   Surgeon: Radiologist, Medication, MD;  Location: [facility];  Service: Radiology;  Laterality: N/A;  ? TEE WITHOUT CARDIOVERSION N/A 07/10/2018  ? Procedure: TRANSESOPHAGEAL ECHOCARDIOGRAM (TEE);  Surgeon: [name], MD;  Location: [facility] OR;  Service: Open Heart Surgery;  Laterality: N/A;  ? TONSILLECTOMY    ? age 63  ? ?Family History  ?Problem Relation Age of Onset  ? Other Mother   ?     blood disorder - unsure of name  ? Heart attack Father   ? Stroke Father   ? Cancer Sister   ? Cancer Brother   ? Asthma Brother   ? Cancer Brother   ? ?Social History  ? ?Socioeconomic History  ? Marital status: Married  ?  Spouse name: Not on file  ?  Number of children: 3  ? Years of education: college  ? Highest education level: Not on file  ?Occupational History  ? Occupation: Retired  ?Tobacco Use  ? Smoking status: Never  ? Smokeless tobacco: Never  ?Vaping Use  ? Vaping Use: Never used  ?Substance and Sexual Activity  ? Alcohol use: No  ? Drug use: No  ? Sexual activity: Not on file  ?Other Topics Concern  ? Not on file  ?Social History Narrative  ? [place]  ? Right-handed.  ? No daily caffeine use.  ? ?Social Determinants of Health  ? ?Financial Resource Strain: Not on file  ?Food Insecurity: No Food Insecurity  ? Worried About Programme researcher, broadcasting/film/videounning Out of Food in the Last Year: Never true  ? Ran Out of Food in the Last Year: Never true  ?Transportation Needs: No Transportation Needs  ? Lack of Transportation (Medical): No  ? Lack of Transportation (Non-Medical): No  ?Physical Activity: Inactive  ? Days of Exercise per Week: 0 days  ? Minutes of Exercise per Session: 0 min  ?Stress: No Stress Concern Present  ? Feeling of Stress : Only a little  ?Social Connections: Not on file  ? ? ?Tobacco Counseling ?Counseling given: Not Answered ? ? ?Clinical Intake: ? ?Pre-visit preparation completed: Yes ? ?Pain : No/denies pain ?Pain Score: 0-No pain ? ?  ? ?BMI - recorded: 20.63 ?Nutritional Status: BMI of 19-24   Normal ?Nutritional Risks: Other (Comment) (no appetite) ?Diabetes: No ? ?How often do you need to have someone help you when you read instructions, pamphlets, or other written materials from your doctor or ph

## 2021-05-13 ENCOUNTER — Encounter: Payer: Self-pay | Admitting: Family Medicine

## 2021-05-13 NOTE — Assessment & Plan Note (Signed)
Check esr. normal ?

## 2021-05-13 NOTE — Assessment & Plan Note (Signed)
Check labs 

## 2021-05-13 NOTE — Assessment & Plan Note (Signed)
Refer to ent 

## 2021-05-13 NOTE — Assessment & Plan Note (Signed)
Complete meds.  ?Continue inhalers.  ?

## 2021-05-16 ENCOUNTER — Other Ambulatory Visit: Payer: Self-pay | Admitting: Nurse Practitioner

## 2021-06-29 ENCOUNTER — Ambulatory Visit (INDEPENDENT_AMBULATORY_CARE_PROVIDER_SITE_OTHER): Payer: Medicare Other | Admitting: Family Medicine

## 2021-06-29 ENCOUNTER — Encounter: Payer: Self-pay | Admitting: Family Medicine

## 2021-06-29 VITALS — BP 154/70 | HR 78 | Temp 97.8°F | Resp 18 | Ht 64.0 in | Wt 119.4 lb

## 2021-06-29 DIAGNOSIS — E038 Other specified hypothyroidism: Secondary | ICD-10-CM

## 2021-06-29 DIAGNOSIS — R1013 Epigastric pain: Secondary | ICD-10-CM

## 2021-06-29 DIAGNOSIS — I119 Hypertensive heart disease without heart failure: Secondary | ICD-10-CM

## 2021-06-29 DIAGNOSIS — R202 Paresthesia of skin: Secondary | ICD-10-CM

## 2021-06-29 DIAGNOSIS — I25110 Atherosclerotic heart disease of native coronary artery with unstable angina pectoris: Secondary | ICD-10-CM

## 2021-06-29 DIAGNOSIS — I110 Hypertensive heart disease with heart failure: Secondary | ICD-10-CM | POA: Diagnosis not present

## 2021-06-29 DIAGNOSIS — E782 Mixed hyperlipidemia: Secondary | ICD-10-CM

## 2021-06-29 DIAGNOSIS — K219 Gastro-esophageal reflux disease without esophagitis: Secondary | ICD-10-CM

## 2021-06-29 DIAGNOSIS — E559 Vitamin D deficiency, unspecified: Secondary | ICD-10-CM

## 2021-06-29 MED ORDER — DICYCLOMINE HCL 10 MG PO CAPS: 10.0000 mg | ORAL_CAPSULE | Freq: Three times a day (TID) | ORAL | 2 refills | Status: DC

## 2021-06-29 NOTE — Progress Notes (Signed)
Subjective:  Patient ID: Leah Pittman, female    DOB: [birth year]  Age: 88 y.o. MRN: [id number]  Chief Complaint  Patient presents with   COPD   Hyperlipidemia    HPI Hypertensive heart disease with heart failure: Current medications: Losartan  take 1/2 tablet if bp goes above 130. Checks bp daily. Aspirin 81 mg 1 tablet daily.  Hyperlipidemia: Current medications: Simvastatin  take 1 tablet daily  Secondary Hypothyroidism: Current medications: Levothyroxine 100 mcg tablet once daily  Patient was started on gabapentin 100 mg before bed for legs burning and cramping. She has been stretching and massaging her legs and feet which helps.   Patient saw ENT and no source of hoarseness was found. Nasopharyngoscopy normal.   Patient continues to have abdominal pain/bowel cramping. Solid hurt/dull ache. Avoids pork. Avoid fried foods. Soft foods do better (cream potatoes.) Unable to eat rich foods, such as lasagna, pizza, spaghetti. Denies GERD. ON pepcid 40 mg once daily at bedtime. Protonix 40 mg twice daily. Poor appetite. Eats a lot of fruit and vegetables.  Currently on Amoxicillin 500 mg three times a day due to her bridge falling out.   HPI reviewed and updated.  Current Outpatient Medications on File Prior to Visit  Medication Sig Dispense Refill   albuterol (VENTOLIN HFA) 108 (90 Base) MCG/ACT inhaler Inhale 2 puffs into the lungs every 4 (four) hours as needed for wheezing or shortness of breath. 18 g 0   amoxicillin (AMOXIL) 500 MG capsule Take 500 mg by mouth 3 (three) times daily.     aspirin 81 MG tablet Take 81 mg by mouth daily.     Budeson-Glycopyrrol-Formoterol (BREZTRI AEROSPHERE) 160-9-4.8 MCG/ACT AERO Inhale 2 puffs into the lungs 2 (two) times daily as needed (for flares). 10.7 g 11   Cholecalciferol (VITAMIN D3 SUPER STRENGTH) 50 MCG (2000 UT) CAPS Take 2,000 Units by mouth daily after lunch.     dorzolamide-timolol (COSOPT) 22.3-6.8 MG/ML ophthalmic  solution Place 1 drop into the left eye 2 (two) times daily.      famotidine (PEPCID) 40 MG tablet TAKE ONE TABLET BY MOUTH ONCE DAILY AT NIGHT. (Patient taking differently: Take 40 mg by mouth at bedtime.) 90 tablet 1   gabapentin (NEURONTIN) 100 MG capsule Take 1 capsule (100 mg total) by mouth at bedtime. 30 capsule 2   levothyroxine (SYNTHROID) 100 MCG tablet Take 100 mcg by mouth daily before breakfast.     losartan (COZAAR) 100 MG tablet Take 0.5 tablets (50 mg total) by mouth daily. 45 tablet 0   pantoprazole (PROTONIX) 40 MG tablet Take 1 tablet (40 mg total) by mouth 2 (two) times daily. (Patient taking differently: Take 40 mg by mouth See admin instructions. Take 40 mg by mouth in the morning before breakfast and 40,mg after lunch) 180 tablet 1   senna-docusate (SENOKOT-S) 8.6-50 MG tablet Take 1 tablet by mouth daily.     DULoxetine (CYMBALTA) 20 MG capsule Take 1 capsule (20 mg total) by mouth daily. (Patient not taking: Reported on 05/11/2021) 30 capsule 11   XELPROS 0.005 % EMUL Place 1 drop into the left eye at bedtime.     No current facility-administered medications on file prior to visit.   Past Medical History:  Diagnosis Date   Anxiety    Arthritis    Chest pain 2011   CARDIOLITE - no significant symptoms, EKG changes, or arrhythmias   Congenital prolapse of bladder mucosa    Diverticulosis    Dyspnea 02/16/2012  2D ECHO - EF 55-60%, normal   Esophageal dysphagia    Fatigue    GERD (gastroesophageal reflux disease)    Glaucoma    Headache(784.0)    Heart attack (HCC)    Hiatal hernia    High blood pressure 02/16/2012   RENAL DOPPLER - normal   Hyperlipidemia    Hypothyroidism    Labile hypertension    Lightheadedness    Migraine headache    Multiple allergies    Myalgia    NSTEMI (non-ST elevated myocardial infarction) (HCC) 07/14/2020   OSA (obstructive sleep apnea)    Pain, lower leg    Calf   Problem of menstruation    Proteinuria    Reflux    SOB  (shortness of breath)    Thyroid disease    Urinary problem    Valvular heart disease    Vitamin B12 deficiency    Vitamin D deficiency    Past Surgical History:  Procedure Laterality Date   ABDOMINAL HYSTERECTOMY  1976   CHOLECYSTECTOMY  2000   COLONOSCOPY  07/27/2014   Moderate predominantly sigmoid divertuculosis. Otherwise normal colonoscopy   CORONARY ARTERY BYPASS GRAFT N/A 07/10/2018   Procedure: CORONARY ARTERY BYPASS GRAFTING (CABG), FREE LIMA;  Surgeon: Alleen BorneBartle, Bryan K, MD;  Location: [facility] OR;  Service: Open Heart Surgery;  Laterality: N/A;   CYST REMOVAL NECK     CYSTECTOMY  1974   Intestine   CYSTECTOMY  1996   Brain stem   ESOPHAGOGASTRODUODENOSCOPY  12/07/2016   Schatzki's ring status post esophageal dilatation. Small hiatal hernia. Mild gastritis   EYE SURGERY  2003   EYE SURGERY  07/2016   LEFT HEART CATH AND CORONARY ANGIOGRAPHY N/A 07/08/2018   Procedure: LEFT HEART CATH AND CORONARY ANGIOGRAPHY;  Surgeon: Runell GessBerry, Jonathan J, MD;  Location: [facility];  Service: Cardiovascular;  Laterality: N/A;   LEFT HEART CATH AND CORONARY ANGIOGRAPHY N/A 07/25/2019   Procedure: LEFT HEART CATH AND CORONARY ANGIOGRAPHY;  Surgeon: Kathleene HazelMcAlhany, Christopher D, MD;  Location: [facility];  Service: Cardiovascular;  Laterality: N/A;   MOUTH SURGERY  2013   RADIOLOGY WITH ANESTHESIA N/A 11/05/2019   Procedure: MRI WITH ANESTHESIA;  Surgeon: Radiologist, Medication, MD;  Location: [facility] OR;  Service: Radiology;  Laterality: N/A;   TEE WITHOUT CARDIOVERSION N/A 07/10/2018   Procedure: TRANSESOPHAGEAL ECHOCARDIOGRAM (TEE);  Surgeon: Alleen BorneBartle, Bryan K, MD;  [facility];  Service: Open Heart Surgery;  Laterality: N/A;   TONSILLECTOMY     age 89    Family History  Problem Relation Age of Onset   Other Mother        blood disorder - unsure of name   Heart attack Father    Stroke Father    Cancer Sister    Cancer Brother    Asthma Brother    Cancer Brother    Social History    Socioeconomic History   Marital status: Married    Spouse name: Not on file   Number of children: 3   Years of education: college   Highest education level: Not on file  Occupational History   Occupation: Retired  Tobacco Use   Smoking status: Never   Smokeless tobacco: Never  Vaping Use   Vaping Use: Never used  Substance and Sexual Activity   Alcohol use: No   Drug use: No   Sexual activity: Not on file  Other Topics Concern   Not on file  Social History Narrative   Lives with  husband in [place], [place].   Right-handed.   No daily caffeine use.   Social Determinants of Health   Financial Resource Strain: Not on file  Food Insecurity: No Food Insecurity   Worried About Programme researcher, broadcasting/film/video in the Last Year: Never true   Ran Out of Food in the Last Year: Never true  Transportation Needs: No Transportation Needs   Lack of Transportation (Medical): No   Lack of Transportation (Non-Medical): No  Physical Activity: Inactive   Days of Exercise per Week: 0 days   Minutes of Exercise per Session: 0 min  Stress: No Stress Concern Present   Feeling of Stress : Only a little  Social Connections: Not on file    Review of Systems  Constitutional:  Negative for appetite change, fatigue and fever.  HENT:  Positive for congestion and voice change. Negative for ear pain, sinus pressure and sore throat.   Eyes:  Negative for pain.  Respiratory:  Positive for cough and shortness of breath. Negative for wheezing.   Cardiovascular:  Positive for chest pain. Negative for palpitations.  Gastrointestinal:  Positive for abdominal pain and nausea. Negative for constipation, diarrhea and vomiting.  Genitourinary:  Negative for dysuria and frequency.  Musculoskeletal:  Negative for arthralgias, back pain, joint swelling and myalgias.  Skin:  Negative for rash.  Neurological:  Positive for weakness and numbness (tingling sensation of finger tips). Negative for dizziness and headaches.        Balance issues   Hematological:  Bruises/bleeds easily: numbness and tingling of lower legs and feet.  Psychiatric/Behavioral:  Negative for dysphoric mood. The patient is not nervous/anxious.     Objective:  BP (!) 154/70   Pulse 78   Temp 97.8 F (36.6 C)   Resp 18   Ht 5\' 4"  (1.626 m)   Wt 119 lb 6.4 oz (54.2 kg)   SpO2 99%   BMI 20.49 kg/m      06/29/2021    9:54 AM 06/29/2021    9:08 AM 05/11/2021    4:20 PM  BP/Weight  Systolic BP 154 146 128  Diastolic BP 70 64 64  Wt. (Lbs)  119.4 120.2  BMI  20.49 kg/m2 20.63 kg/m2    Physical Exam Vitals reviewed.  Constitutional:      Appearance: Normal appearance. She is normal weight.  Neck:     Vascular: No carotid bruit.  Cardiovascular:     Rate and Rhythm: Normal rate and regular rhythm.     Heart sounds: Normal heart sounds.  Pulmonary:     Effort: Pulmonary effort is normal.     Breath sounds: Normal breath sounds.  Abdominal:     General: Abdomen is flat. Bowel sounds are normal.     Palpations: Abdomen is soft.     Tenderness: There is abdominal tenderness (epigastric).  Neurological:     Mental Status: She is alert and oriented to person, place, and time. Mental status is at baseline.  Psychiatric:        Mood and Affect: Mood normal.        Behavior: Behavior normal.    Diabetic Foot Exam - Simple   No data filed      Lab Results  Component Value Date   WBC 8.5 06/29/2021   HGB 12.4 06/29/2021   HCT 36.0 06/29/2021   PLT 241 06/29/2021   GLUCOSE 98 06/29/2021   CHOL 188 06/29/2021   TRIG 105 06/29/2021   HDL 64 06/29/2021   LDLCALC  105 (H) 06/29/2021   ALT 17 06/29/2021   AST 17 06/29/2021   NA 138 06/29/2021   K 4.8 06/29/2021   CL 103 06/29/2021   CREATININE 0.84 06/29/2021   BUN 17 06/29/2021   CO2 23 06/29/2021   TSH 2.990 06/29/2021   INR 0.9 07/14/2020   HGBA1C 5.8 (H) 07/10/2018      Assessment & Plan:   Problem List Items Addressed This Visit       Cardiovascular and  Mediastinum   Coronary artery disease involving native coronary artery of native heart with unstable angina pectoris (HCC)    Continue aspirin 81 mg daily. Continue simvastatin.       Hypertensive heart disease with heart failure (HCC) - Primary    Fair control. Continue Losartan 100mg  take 1/2 tablet if bp goes above 130.       Relevant Orders   CBC with Differential (Completed)   Comprehensive metabolic panel (Completed)     Digestive   GERD (gastroesophageal reflux disease)   Relevant Medications   senna-docusate (SENOKOT-S) 8.6-50 MG tablet   dicyclomine (BENTYL) 10 MG capsule     Endocrine   Secondary hypothyroidism    The current medical regimen is effective;  continue present plan and medications. Continue levothyroxine 100 mcg daily       Relevant Medications   levothyroxine (SYNTHROID) 100 MCG tablet   Other Relevant Orders   TSH (Completed)     Other   Epigastric abdominal pain    Started on dicyclomine (Bentyl) 10 mg before every meal and nightly.  Continue Pepcid and Protonix.      Relevant Medications   dicyclomine (BENTYL) 10 MG capsule   Paresthesias    Check labs       Relevant Orders   B12 and Folate Panel (Completed)   Methylmalonic acid, serum (Completed)   VITAMIN D 25 Hydroxy (Vit-D Deficiency, Fractures) (Completed)   Mixed hyperlipidemia    Well controlled.  No changes to medicines. Continue Simvastatin 20mg  take 1 tablet daily Continue to work on eating a healthy diet and exercise.  Labs drawn today.        Relevant Orders   Lipid Panel (Completed)   Vitamin D insufficiency    Check vitamin D       Relevant Orders   VITAMIN D 25 Hydroxy (Vit-D Deficiency, Fractures) (Completed)  .  Meds ordered this encounter  Medications   dicyclomine (BENTYL) 10 MG capsule    Sig: Take 1 capsule (10 mg total) by mouth 4 (four) times daily -  before meals and at bedtime.    Dispense:  120 capsule    Refill:  2    Orders Placed This  Encounter  Procedures   CBC with Differential   Comprehensive metabolic panel   Lipid Panel   TSH   B12 and Folate Panel   Methylmalonic acid, serum   VITAMIN D 25 Hydroxy (Vit-D Deficiency, Fractures)    Total time spent on today's visit was greater than 30 minutes, including both face-to-face time and nonface-to-face time personally spent on review of chart (labs and imaging), discussing labs and goals, discussing further work-up, treatment options, referrals to specialist if needed, reviewing outside records of pertinent, answering patient's questions, and coordinating care.  Follow-up: Return in about 3 months (around 09/29/2021) for chronic fasting.  An After Visit Summary was printed and given to the patient.  , MD [facility] [phone]

## 2021-06-29 NOTE — Patient Instructions (Signed)
Started on dicyclomine (Bentyl) 10 mg before every meal and nightly. ?

## 2021-07-06 LAB — CBC WITH DIFFERENTIAL/PLATELET
Basophils Absolute: 0.1 10*3/uL (ref 0.0–0.2)
Basos: 1 %
EOS (ABSOLUTE): 0.4 10*3/uL (ref 0.0–0.4)
Eos: 5 %
Hematocrit: 36 % (ref 34.0–46.6)
Hemoglobin: 12.4 g/dL (ref 11.1–15.9)
Immature Grans (Abs): 0 10*3/uL (ref 0.0–0.1)
Immature Granulocytes: 0 %
Lymphocytes Absolute: 1.8 10*3/uL (ref 0.7–3.1)
Lymphs: 22 %
MCH: 31.7 pg (ref 26.6–33.0)
MCHC: 34.4 g/dL (ref 31.5–35.7)
MCV: 92 fL (ref 79–97)
Monocytes Absolute: 1 10*3/uL — ABNORMAL HIGH (ref 0.1–0.9)
Monocytes: 11 %
Neutrophils Absolute: 5.2 10*3/uL (ref 1.4–7.0)
Neutrophils: 61 %
Platelets: 241 10*3/uL (ref 150–450)
RBC: 3.91 x10E6/uL (ref 3.77–5.28)
RDW: 14.5 % (ref 11.7–15.4)
WBC: 8.5 10*3/uL (ref 3.4–10.8)

## 2021-07-06 LAB — LIPID PANEL
Chol/HDL Ratio: 2.9 ratio (ref 0.0–4.4)
Cholesterol, Total: 188 mg/dL (ref 100–199)
HDL: 64 mg/dL (ref >39)
LDL Chol Calc (NIH): 105 mg/dL — ABNORMAL HIGH (ref 0–99)
Triglycerides: 105 mg/dL (ref 0–149)
VLDL Cholesterol Cal: 19 mg/dL (ref 5–40)

## 2021-07-06 LAB — COMPREHENSIVE METABOLIC PANEL
ALT: 17 IU/L (ref 0–32)
AST: 17 IU/L (ref 0–40)
Albumin/Globulin Ratio: 1.8 (ref 1.2–2.2)
Albumin: 4.5 g/dL (ref 3.6–4.6)
Alkaline Phosphatase: 79 IU/L (ref 44–121)
BUN/Creatinine Ratio: 20 (ref 12–28)
BUN: 17 mg/dL (ref 8–27)
Bilirubin Total: 0.4 mg/dL (ref 0.0–1.2)
CO2: 23 mmol/L (ref 20–29)
Calcium: 10.3 mg/dL (ref 8.7–10.3)
Chloride: 103 mmol/L (ref 96–106)
Creatinine, Ser: 0.84 mg/dL (ref 0.57–1.00)
Globulin, Total: 2.5 g/dL (ref 1.5–4.5)
Glucose: 98 mg/dL (ref 70–99)
Potassium: 4.8 mmol/L (ref 3.5–5.2)
Sodium: 138 mmol/L (ref 134–144)
Total Protein: 7 g/dL (ref 6.0–8.5)
eGFR: 67 mL/min/{1.73_m2} (ref >59)

## 2021-07-06 LAB — B12 AND FOLATE PANEL
Folate: 13 ng/mL (ref >3.0)
Vitamin B-12: 386 pg/mL (ref 232–1245)

## 2021-07-06 LAB — TSH: TSH: 2.99 u[IU]/mL (ref 0.450–4.500)

## 2021-07-06 LAB — VITAMIN D 25 HYDROXY (VIT D DEFICIENCY, FRACTURES): Vit D, 25-Hydroxy: 43 ng/mL (ref 30.0–100.0)

## 2021-07-06 LAB — METHYLMALONIC ACID, SERUM: Methylmalonic Acid: 167 nmol/L (ref 0–378)

## 2021-07-07 ENCOUNTER — Other Ambulatory Visit: Payer: Self-pay

## 2021-07-07 MED ORDER — SIMVASTATIN 40 MG PO TABS: 40.0000 mg | ORAL_TABLET | Freq: Every day | ORAL | 3 refills | Status: DC

## 2021-07-07 NOTE — Telephone Encounter (Signed)
Refill sent to pharmacy.   

## 2021-07-15 ENCOUNTER — Telehealth: Payer: Self-pay

## 2021-07-15 NOTE — Progress Notes (Signed)
Chronic Care Management Pharmacy Assistant   Name: IVIONA HOLE  MRN: [id number] DOB: [birth year]   Reason for Encounter: Disease State call for HTN    Recent office visits:  06/29/21 Blane Ohara MD. Seen for routine visit. Started on Dicyclomine HCI 10mg . Decreased Levothyroxine Sodium to 100mg  daily.   05/11/21 LPN. Seen for Medicare Annual Wellness. No med changes.   04/28/21 Caro Hight MD. Seen for Vascular Headache. Referral to ENT. No med changes.   04/21/21 Blane Ohara MD. Seen for Acute Bronchitis. Started on Augmentin 875-125mg .   03/31/21 Blane Ohara CMA. Orders Only. Increased Levothyroxine to 05/29/21 daily.   03/25/21 MD. Seen for routine visit. Started on Gabapentin 100mg .   Recent consult visits:  04/27/21 Bond, Blane Ohara MD. Seen for Glaucoma Evaluation. No med changes.  01/26/21 06/27/21 MD. Seen for Glaucoma Evaluation. Ordered Cosopt 22.3-6.8mg .   Hospital visits:  None  Medications: Outpatient Encounter Medications as of 07/15/2021  Medication Sig Note   albuterol (VENTOLIN HFA) 108 (90 Base) MCG/ACT inhaler Inhale 2 puffs into the lungs every 4 (four) hours as needed for wheezing or shortness of breath.    amoxicillin (AMOXIL) 500 MG capsule Take 500 mg by mouth 3 (three) times daily.    aspirin 81 MG tablet Take 81 mg by mouth daily.    Budeson-Glycopyrrol-Formoterol (BREZTRI AEROSPHERE) 160-9-4.8 MCG/ACT AERO Inhale 2 puffs into the lungs 2 (two) times daily as needed (for flares).    Cholecalciferol (VITAMIN D3 SUPER STRENGTH) 50 MCG (2000 UT) CAPS Take 2,000 Units by mouth daily after lunch.    dicyclomine (BENTYL) 10 MG capsule Take 1 capsule (10 mg total) by mouth 4 (four) times daily -  before meals and at bedtime.    dorzolamide-timolol (COSOPT) 22.3-6.8 MG/ML ophthalmic solution Place 1 drop into the left eye 2 (two) times daily.     DULoxetine (CYMBALTA) 20 MG capsule Take 1 capsule (20 mg total) by  mouth daily. (Patient not taking: Reported on 05/11/2021)    famotidine (PEPCID) 40 MG tablet TAKE ONE TABLET BY MOUTH ONCE DAILY AT NIGHT. (Patient taking differently: Take 40 mg by mouth at bedtime.)    gabapentin (NEURONTIN) 100 MG capsule Take 1 capsule (100 mg total) by mouth at bedtime.    levothyroxine (SYNTHROID) 100 MCG tablet Take 100 mcg by mouth daily before breakfast.    losartan (COZAAR) 100 MG tablet Take 0.5 tablets (50 mg total) by mouth daily. 05/11/2021: Patient reports that she only takes when her BP is high because it makes her BP too low   pantoprazole (PROTONIX) 40 MG tablet Take 1 tablet (40 mg total) by mouth 2 (two) times daily. (Patient taking differently: Take 40 mg by mouth See admin instructions. Take 40 mg by mouth in the morning before breakfast and 40,mg after lunch) 01/05/2021: The patient stated she takes both of her tablets by midday (each day)   senna-docusate (SENOKOT-S) 8.6-50 MG tablet Take 1 tablet by mouth daily.    simvastatin (ZOCOR) 40 MG tablet Take 1 tablet (40 mg total) by mouth at bedtime.    XELPROS 0.005 % EMUL Place 1 drop into the left eye at bedtime.    No facility-administered encounter medications on file as of 07/15/2021.     Recent Office Vitals: BP Readings from Last 3 Encounters:  06/29/21 (!) 154/70  05/11/21 128/64  04/28/21 136/64   Pulse Readings from Last 3 Encounters:  06/29/21 78  05/11/21 72  04/28/21 70    Wt Readings from Last 3 Encounters:  06/29/21 119 lb 6.4 oz (54.2 kg)  05/11/21 120 lb 3.2 oz (54.5 kg)  04/28/21 119 lb (54 kg)     Kidney Function Lab Results  Component Value Date/Time   CREATININE 0.84 06/29/2021 10:01 AM   CREATININE 0.80 04/21/2021 03:03 PM   GFR 60.94 01/27/2020 04:11 PM   GFRNONAA >60 01/05/2021 12:38 PM   GFRAA 64 12/22/2019 11:40 AM       Latest Ref Rng & Units 06/29/2021   10:01 AM 04/21/2021    3:03 PM 03/25/2021    9:22 AM  BMP  Glucose 70 - 99 mg/dL 98   88   91    BUN 8 - 27  mg/dL 17   13   16     Creatinine 0.57 - 1.00 mg/dL   2.42   3.53    BUN/Creat Ratio 12 - 28 20   16   18     Sodium 134 - 144 mmol/L 138   138   139    Potassium 3.5 - 5.2 mmol/L 4.8   4.7   4.3    Chloride 96 - 106 mmol/L 103   103   103    CO2 20 - 29 mmol/L 23   23   22     Calcium 8.7 - 10.3 mg/dL 6.14     9.8       Current antihypertensive regimen:  Losartan 100mg - Take 0.5mg  tablet daily  Patient verbally confirms she is taking the above medications as directed. Yes  How often are you checking your Blood Pressure? daily  she checks her blood pressure in the morning before taking her medication.  Current home BP readings: 07/15/21 180/61 07/11/21 96/40  Wrist or arm cuff: Arm  Caffeine intake:1 cup of coffee  Salt intake:No added salt  Any readings above 180/120? No  What recent interventions/DTPs have been made by any provider to improve Blood Pressure control since last CPP Visit:  No changes   Any recent hospitalizations or ED visits since last visit with CPP? No  What diet changes have been made to improve Blood Pressure Control?  No changes  What exercise is being done to improve your Blood Pressure Control?  Pt has been sick with bronchitis and has not gotten any exercise   Adherence Review: Is the patient currently on ACE/ARB medication? Yes Does the patient have >5 day gap between last estimated fill dates? CPP to review  Care Gaps: Last annual wellness visit?05/11/21  Star Rating Drugs:  Medication:  Last Fill: Day Supply  Losartan   12/30/20 90DS  Pt is only taking a half tablet and sometimes she does not take one at all if her BP is low so the last supply has lasted her a while. Pt has declined a f/u with [facility]PP at this time.       07/17/21, CMA Clinical Pharmacist Assistant  [phone]

## 2021-07-18 DIAGNOSIS — E559 Vitamin D deficiency, unspecified: Secondary | ICD-10-CM | POA: Insufficient documentation

## 2021-07-18 DIAGNOSIS — I119 Hypertensive heart disease without heart failure: Secondary | ICD-10-CM | POA: Insufficient documentation

## 2021-07-18 NOTE — Assessment & Plan Note (Signed)
Fair control. Continue Losartan 100mg  take 1/2 tablet if bp goes above 130.

## 2021-07-18 NOTE — Assessment & Plan Note (Signed)
Check labs 

## 2021-07-18 NOTE — Assessment & Plan Note (Signed)
Continue aspirin 81 mg daily. Continue simvastatin.

## 2021-07-18 NOTE — Assessment & Plan Note (Signed)
The current medical regimen is effective;  continue present plan and medications. Continue levothyroxine 100 mcg daily.  

## 2021-07-18 NOTE — Assessment & Plan Note (Signed)
Started on dicyclomine (Bentyl) 10 mg before every meal and nightly.  Continue Pepcid and Protonix.

## 2021-07-18 NOTE — Assessment & Plan Note (Signed)
Well controlled.  No changes to medicines. Continue Simvastatin 20mg  take 1 tablet daily Continue to work on eating a healthy diet and exercise.  Labs drawn today.

## 2021-07-18 NOTE — Assessment & Plan Note (Signed)
Check vitamin D. 

## 2021-07-22 NOTE — Telephone Encounter (Signed)
I know how she takes medicine. Thank you for the update however. Dr Sedalia Muta

## 2021-07-25 ENCOUNTER — Other Ambulatory Visit: Payer: Self-pay | Admitting: Family Medicine

## 2021-07-25 DIAGNOSIS — R1319 Other dysphagia: Secondary | ICD-10-CM

## 2021-08-01 ENCOUNTER — Telehealth: Payer: Self-pay | Admitting: Cardiovascular Disease

## 2021-08-01 NOTE — Telephone Encounter (Signed)
Pt needs an appt with Dr Royann Shivers, please call [phone] to schedule

## 2021-08-08 NOTE — Telephone Encounter (Signed)
LVM for patient to schedule.

## 2021-08-10 ENCOUNTER — Telehealth: Payer: Self-pay

## 2021-08-10 ENCOUNTER — Inpatient Hospital Stay: Payer: Medicare Other | Admitting: Family Medicine

## 2021-08-10 NOTE — Telephone Encounter (Signed)
Malkie was notified to call Dr. Rexene Alberts Christopher's office for an appointment.  Dr. Sedalia Muta was able to speak to her by phone and they were instructed to call the scheduler for an appointment.

## 2021-08-11 ENCOUNTER — Other Ambulatory Visit: Payer: Self-pay | Admitting: Physician Assistant

## 2021-08-13 ENCOUNTER — Other Ambulatory Visit: Payer: Self-pay | Admitting: Physician Assistant

## 2021-08-29 ENCOUNTER — Other Ambulatory Visit: Payer: Self-pay

## 2021-08-29 MED ORDER — LOSARTAN POTASSIUM 25 MG PO TABS: 25.0000 mg | ORAL_TABLET | Freq: Every day | ORAL | 0 refills | Status: DC

## 2021-08-29 NOTE — Telephone Encounter (Signed)
Patient left voicemail stating she is suppose to be taking 25 mg of her blood pressure medication.

## 2021-09-05 ENCOUNTER — Telehealth: Payer: Self-pay

## 2021-09-05 NOTE — Progress Notes (Signed)
Chronic Care Management Pharmacy Assistant   Name: Leah Pittman  MRN: [id number] DOB: 1930-12-04   Reason for Encounter: Disease State call for HTN   Recent office visits:  None  Recent consult visits:  07/27/21 [facility]) Leah Pittman, Doran Stabler MD. Seen for Glaucoma Evaluation. Ordered Zioptan eye drops.   Hospital visits:  None  Medications: Outpatient Encounter Medications as of 09/05/2021  Medication Sig   albuterol (VENTOLIN HFA) 108 (90 Base) MCG/ACT inhaler Inhale 2 puffs into the lungs every 4 (four) hours as needed for wheezing or shortness of breath.   amoxicillin (AMOXIL) 500 MG capsule Take 500 mg by mouth 3 (three) times daily.   aspirin 81 MG tablet Take 81 mg by mouth daily.   Budeson-Glycopyrrol-Formoterol (BREZTRI AEROSPHERE) 160-9-4.8 MCG/ACT AERO Inhale 2 puffs into the lungs 2 (two) times daily as needed (for flares).   Cholecalciferol (VITAMIN D3 SUPER STRENGTH) 50 MCG (2000 UT) CAPS Take 2,000 Units by mouth daily after lunch.   dicyclomine (BENTYL) 10 MG capsule Take 1 capsule (10 mg total) by mouth 4 (four) times daily -  before meals and at bedtime.   dorzolamide-timolol (COSOPT) 22.3-6.8 MG/ML ophthalmic solution Place 1 drop into the left eye 2 (two) times daily.    DULoxetine (CYMBALTA) 20 MG capsule Take 1 capsule (20 mg total) by mouth daily. (Patient not taking: Reported on 05/11/2021)   famotidine (PEPCID) 40 MG tablet TAKE ONE TABLET BY MOUTH ONCE DAILY AT NIGHT. (Patient taking differently: Take 40 mg by mouth at bedtime.)   gabapentin (NEURONTIN) 100 MG capsule Take 1 capsule (100 mg total) by mouth at bedtime.   levothyroxine (SYNTHROID) 100 MCG tablet Take 100 mcg by mouth daily before breakfast.   losartan (COZAAR) 25 MG tablet Take 1 tablet (25 mg total) by mouth daily.   pantoprazole (PROTONIX) 40 MG tablet TAKE 1 TABLET BY MOUTH TWICE A DAY   senna-docusate (SENOKOT-S) 8.6-50 MG tablet Take 1 tablet by mouth daily.   simvastatin (ZOCOR) 40  MG tablet Take 1 tablet (40 mg total) by mouth at bedtime.   XELPROS 0.005 % EMUL Place 1 drop into the left eye at bedtime.   No facility-administered encounter medications on file as of 09/05/2021.     Recent Office Vitals: BP Readings from Last 3 Encounters:  06/29/21 (!) 154/70  05/11/21 128/64  04/28/21 136/64   Pulse Readings from Last 3 Encounters:  06/29/21 78  05/11/21 72  04/28/21 70    Wt Readings from Last 3 Encounters:  06/29/21 119 lb 6.4 oz (54.2 kg)  05/11/21 120 lb 3.2 oz (54.5 kg)  04/28/21 119 lb (54 kg)     Kidney Function Lab Results  Component Value Date/Time   CREATININE 0.84 06/29/2021 10:01 AM   CREATININE 0.80 04/21/2021 03:03 PM   GFR 60.94 01/27/2020 04:11 PM   GFRNONAA >60 01/05/2021 12:38 PM   GFRAA 64 12/22/2019 11:40 AM       Latest Ref Rng & Units 06/29/2021   10:01 AM 04/21/2021    3:03 PM 03/25/2021    9:22 AM  BMP  Glucose 70 - 99 mg/dL 98  88  91   BUN 8 - 27 mg/dL 17  13  16    Creatinine 0.57 - 1.00 mg/dL  2.70  6.23   BUN/Creat Ratio 12 - 28 20  16  18    Sodium 134 - 144 mmol/L 138  138  139   Potassium 3.5 - 5.2 mmol/L 4.8  4.7  4.3   Chloride 96 - 106 mmol/L 103  103  103   CO2 20 - 29 mmol/L 23  23  22    Calcium 8.7 - 10.3 mg/dL  81.8  9.8      Current antihypertensive regimen:  Losartan 25mg - Take 1 tablet daily  Patient verbally confirms she is taking the above medications as directed. Yes  How often are you checking your Blood Pressure? infrequently  Current home BP readings: . Pt has not had a lot of readings lately, She stated she has not felt good lately. Her Symptoms include weakness and has mucus buildup in her throat, no fever.  09/05/21 131/42  Last office visit 06/29/21 154/70  Wrist or arm cuff: Arm  Caffeine intake:1 cup of coffee  Salt intake:No added salt  Any readings above 180/120? No  Any readings above 180/120? No   What recent interventions/DTPs have been made by any provider to  improve Blood Pressure control since last CPP Visit: Pt Losartan was decreased to 25mg  daily.  Any recent hospitalizations or ED visits since last visit with CPP? Yes, was in the [facility] for high fever a few weeks ago.   Adherence Review: Is the patient currently on ACE/ARB medication? Yes Does the patient have >5 day gap between last estimated fill dates? CPP to review  Care Gaps: Last annual wellness visit?05/11/21  Star Rating Drugs:  Medication:  Last Fill: Day Supply  Losartan   08/02/21  30ds  She had some Losartan left over when she was taking 100mg  tablets   [facility], CMA Clinical Pharmacist Assistant  [phone]

## 2021-09-07 ENCOUNTER — Other Ambulatory Visit: Payer: Self-pay

## 2021-09-07 MED ORDER — SIMVASTATIN 40 MG PO TABS
40.0000 mg | ORAL_TABLET | Freq: Every day | ORAL | 1 refills | Status: DC
Start: 2021-09-07 — End: 2021-12-12

## 2021-10-06 NOTE — Progress Notes (Signed)
Acute Office Visit  Subjective:    Patient ID: Leah Pittman, female    DOB: 01/25/1931, 88 y.o.   MRN: [id number]  Chief Complaint  Patient presents with   BP readings going up and down   Hypertension    HPI Patient is in today for to follow up on BP. Patient c/o SOB all the time, but it gets worst at night. Patient states that some times once she takes a deep cough it gets better.  Hypertension  Medication: Losartan 25 mg daily at lunchtime.   Patient is having muscle cramps. Patient is nauseated and abdominal pain. Epigastric region seems swollen. Decreased appetite. Has not been able to taste food for 3 years. Happened after CABG.  Becomes exhausted with minimal exertion.  Patient saw pulmonologist (Dr. Alcide Clever, and one in [place] in the past.)  Husband would like portable oxygen through [employer] [email or web address].    Past Medical History:  Diagnosis Date   Anxiety    Arthritis    Chest pain 2011   CARDIOLITE - no significant symptoms, EKG changes, or arrhythmias   Congenital prolapse of bladder mucosa    Diverticulosis    Dyspnea 02/16/2012   2D ECHO - EF 55-60%, normal   Esophageal dysphagia    Fatigue    GERD (gastroesophageal reflux disease)    Glaucoma    Headache(784.0)    Heart attack ([place])    Hiatal hernia    High blood pressure 02/16/2012   RENAL DOPPLER - normal   Hyperlipidemia    Hypothyroidism    Labile hypertension    Lightheadedness    Migraine headache    Multiple allergies    Myalgia    NSTEMI (non-ST elevated myocardial infarction) ([place]) 07/14/2020   OSA (obstructive sleep apnea)    Pain, lower leg    Calf   Problem of menstruation    Proteinuria    Reflux    SOB (shortness of breath)    Thyroid disease    Urinary problem    Valvular heart disease    Vitamin B12 deficiency    Vitamin D deficiency     Past Surgical History:  Procedure Laterality Date   ABDOMINAL HYSTERECTOMY  1976   CHOLECYSTECTOMY  2000   COLONOSCOPY   07/27/2014   Moderate predominantly sigmoid divertuculosis. Otherwise normal colonoscopy   CORONARY ARTERY BYPASS GRAFT N/A 07/10/2018   Procedure: CORONARY ARTERY BYPASS GRAFTING (CABG), FREE LIMA;  Surgeon: Gaye Pollack, MD;  Location: [place] OR;  Service: Open Heart Surgery;  Laterality: N/A;   CYST REMOVAL NECK     CYSTECTOMY  1974   Intestine   CYSTECTOMY  1996   Brain stem   ESOPHAGOGASTRODUODENOSCOPY  12/07/2016   Schatzki's ring status post esophageal dilatation. Small hiatal hernia. Mild gastritis   EYE SURGERY  2003   EYE SURGERY  07/2016   LEFT HEART CATH AND CORONARY ANGIOGRAPHY N/A 07/08/2018   Procedure: LEFT HEART CATH AND CORONARY ANGIOGRAPHY;  Surgeon: Lorretta Harp, MD;  Location: [place] [facility];  Service: Cardiovascular;  Laterality: N/A;   LEFT HEART CATH AND CORONARY ANGIOGRAPHY N/A 07/25/2019   Procedure: LEFT HEART CATH AND CORONARY ANGIOGRAPHY;  Surgeon: Burnell Blanks, MD;  Location: [place] [facility];  Service: Cardiovascular;  Laterality: N/A;   MOUTH SURGERY  2013   RADIOLOGY WITH ANESTHESIA N/A 11/05/2019   Procedure: MRI WITH ANESTHESIA;  Surgeon: Radiologist, Medication, MD;  Location: [place];  Service: Radiology;  Laterality: N/A;  TEE WITHOUT CARDIOVERSION N/A 07/10/2018   Procedure: TRANSESOPHAGEAL ECHOCARDIOGRAM (TEE);  Surgeon: Gaye Pollack, MD;  Location: [place];  Service: Open Heart Surgery;  Laterality: N/A;   TONSILLECTOMY     age 63    Family History  Problem Relation Age of Onset   Other Mother        blood disorder - unsure of name   Heart attack Father    Stroke Father    Cancer Sister    Cancer Brother    Asthma Brother    Cancer Brother     Social History   Socioeconomic History   Marital status: Married    Spouse name: Not on file   Number of children: 3   Years of education: college   Highest education level: Not on file  Occupational History   Occupation: Retired  Tobacco Use   Smoking status: Never    Smokeless tobacco: Never  Scientific laboratory technician Use: Never used  Substance and Sexual Activity   Alcohol use: No   Drug use: No   Sexual activity: Not on file  Other Topics Concern   Not on file  Social History Narrative   Lives with husband in [place], [place].   Right-handed.   No daily caffeine use.   Social Determinants of Health   Financial Resource Strain: Not on file  Food Insecurity: No Food Insecurity (05/11/2021)   Hunger Vital Sign    Worried About Running Out of Food in the Last Year: Never true    Ran Out of Food in the Last Year: Never true  Transportation Needs: No Transportation Needs (05/11/2021)   PRAPARE - Hydrologist (Medical): No    Lack of Transportation (Non-Medical): No  Physical Activity: Inactive (05/11/2021)   Exercise Vital Sign    Days of Exercise per Week: 0 days    Minutes of Exercise per Session: 0 min  Stress: No Stress Concern Present (05/11/2021)   [place]    Feeling of Stress : Only a little  Social Connections: Not on file  Intimate Partner Violence: Not At Risk (05/11/2021)   Humiliation, Afraid, Rape, and Kick questionnaire    Fear of Current or Ex-Partner: No    Emotionally Abused: No    Physically Abused: No    Sexually Abused: No    Outpatient Medications Prior to Visit  Medication Sig Dispense Refill   albuterol (VENTOLIN HFA) 108 (90 Base) MCG/ACT inhaler Inhale 2 puffs into the lungs every 4 (four) hours as needed for wheezing or shortness of breath. 18 g 0   aspirin 81 MG tablet Take 81 mg by mouth daily.     Cholecalciferol (VITAMIN D3 SUPER STRENGTH) 50 MCG (2000 UT) CAPS Take 2,000 Units by mouth daily after lunch.     dorzolamide-timolol (COSOPT) 22.3-6.8 MG/ML ophthalmic solution Place 1 drop into the left eye 2 (two) times daily.      levothyroxine (SYNTHROID) 100 MCG tablet Take 100 mcg by mouth daily before breakfast.     losartan  (COZAAR) 25 MG tablet Take 1 tablet (25 mg total) by mouth daily. 90 tablet 0   pantoprazole (PROTONIX) 40 MG tablet TAKE 1 TABLET BY MOUTH TWICE A DAY 180 tablet 1   senna-docusate (SENOKOT-S) 8.6-50 MG tablet Take 1 tablet by mouth daily.     simvastatin (ZOCOR) 40 MG tablet Take 1 tablet (40 mg total) by mouth at bedtime. [place]  tablet 1   XELPROS 0.005 % EMUL Place 1 drop into the left eye at bedtime.     Budeson-Glycopyrrol-Formoterol (BREZTRI AEROSPHERE) 160-9-4.8 MCG/ACT AERO Inhale 2 puffs into the lungs 2 (two) times daily as needed (for flares). 10.7 g 11   amoxicillin (AMOXIL) 500 MG capsule Take 500 mg by mouth 3 (three) times daily.     dicyclomine (BENTYL) 10 MG capsule Take 1 capsule (10 mg total) by mouth 4 (four) times daily -  before meals and at bedtime. 120 capsule 2   DULoxetine (CYMBALTA) 20 MG capsule Take 1 capsule (20 mg total) by mouth daily. (Patient not taking: Reported on 05/11/2021) 30 capsule 11   famotidine (PEPCID) 40 MG tablet TAKE ONE TABLET BY MOUTH ONCE DAILY AT NIGHT. (Patient taking differently: Take 40 mg by mouth at bedtime.) 90 tablet 1   gabapentin (NEURONTIN) 100 MG capsule Take 1 capsule (100 mg total) by mouth at bedtime. 30 capsule 2   No facility-administered medications prior to visit.    Allergies  Allergen Reactions   Benadryl [Diphenhydramine] Swelling and Other (See Comments)    Tongue swells and hallucinates- could not talk   Lipase Concentrate-Hp [Digestive Enzymes] Rash   Zenpep [Pancrelipase (Lip-Prot-Amyl)] Rash   Codeine Nausea And Vomiting and Hypertension   Meperidine Nausea Only, Swelling and Other (See Comments)    Tongue swells and "I feel like death"   Other Other (See Comments)    SKIN IS VERY SENSITIVE AND IT TEARS EASILY!!   Prednisone Hypertension   Promethazine Other (See Comments)    Hypotension    Propranolol Nausea And Vomiting   Shellfish Allergy Nausea And Vomiting   Tape Other (See Comments)    Tears the skin    Tazarotene Other (See Comments)    Reaction not recalled- "Tazarotene, commonly marketed as Tazorac, Avage, and Zorac, is member of the acetylenic class of retinoids."    Iodinated Contrast Media Nausea Only and Rash    Review of Systems  Constitutional:  Negative for appetite change, fatigue and fever.  HENT:  Positive for congestion (Chest Congestion). Negative for ear pain, sinus pressure and sore throat.   Respiratory:  Positive for shortness of breath. Negative for cough, chest tightness and wheezing.   Cardiovascular:  Negative for chest pain and palpitations.  Gastrointestinal:  Negative for abdominal pain, constipation, diarrhea, nausea and vomiting.  Genitourinary:  Negative for dysuria and hematuria.  Musculoskeletal:  Negative for arthralgias, back pain, joint swelling and myalgias.  Skin:  Negative for rash.  Neurological:  Positive for weakness. Negative for dizziness and headaches.  Psychiatric/Behavioral:  Negative for dysphoric mood. The patient is not nervous/anxious.        Objective:    Physical Exam Vitals reviewed.  Constitutional:      Appearance: Normal appearance. She is normal weight.  Cardiovascular:     Rate and Rhythm: Normal rate and regular rhythm.     Heart sounds: Normal heart sounds.  Pulmonary:     Effort: Pulmonary effort is normal. No respiratory distress.     Breath sounds: Normal breath sounds.  Abdominal:     General: Abdomen is flat. Bowel sounds are normal.     Palpations: Abdomen is soft.     Tenderness: There is abdominal tenderness (diffuse generalized. worse in epigastric region.).  Neurological:     Mental Status: She is alert and oriented to person, place, and time.  Psychiatric:        Mood and Affect: Mood normal.  Behavior: Behavior normal.    BP 100/80 (BP Location: Left Arm, Patient Position: Sitting, Cuff Size: Small)   Pulse 77   Temp (!) 97.3 F (36.3 C) (Temporal)   Wt 116 lb 6.4 oz (52.8 kg)   SpO2 99%    BMI 19.98 kg/m  Wt Readings from Last 3 Encounters:  10/07/21 116 lb 6.4 oz (52.8 kg)  06/29/21 119 lb 6.4 oz (54.2 kg)  05/11/21 120 lb 3.2 oz (54.5 kg)    Health Maintenance Due  Topic Date Due   Zoster Vaccines- Shingrix (1 of 2) Never done   Pneumonia Vaccine 14+ Years old (1 - PCV) Never done   INFLUENZA VACCINE  09/20/2021    There are no preventive care reminders to display for this patient.   Lab Results  Component Value Date   TSH 0.984 10/07/2021   Lab Results  Component Value Date   WBC 8.6 10/07/2021   HGB 12.1 10/07/2021   HCT 36.3 10/07/2021   MCV 93 10/07/2021   PLT 268 10/07/2021   Lab Results  Component Value Date   NA 138 10/07/2021   K 4.5 10/07/2021   CO2 21 10/07/2021   GLUCOSE 95 10/07/2021   BUN 18 10/07/2021   CREATININE 0.89 10/07/2021   BILITOT 0.4 10/07/2021   ALKPHOS 73 10/07/2021   AST 17 10/07/2021   ALT 13 10/07/2021   PROT 6.2 10/07/2021   ALBUMIN 4.2 10/07/2021   CALCIUM 10.0 10/07/2021   ANIONGAP 7 01/05/2021   EGFR 62 10/07/2021   GFR 60.94 01/27/2020   Lab Results  Component Value Date   CHOL 173 10/07/2021   Lab Results  Component Value Date   HDL 68 10/07/2021   Lab Results  Component Value Date   LDLCALC 87 10/07/2021   Lab Results  Component Value Date   TRIG 98 10/07/2021   Lab Results  Component Value Date   CHOLHDL 2.5 10/07/2021   Lab Results  Component Value Date   HGBA1C 5.8 (H) 07/10/2018         Assessment & Plan:   Problem List Items Addressed This Visit       Cardiovascular and Mediastinum   Hypertensive arteriosclerotic cardiovascular disease    Continue losartan 25 mg daily. Change bp medicine (losartan) to night time dosing. Continue aspirin and zocor.  Refer to cardiopulmonary rehab.       Relevant Orders   CBC with Differential/Platelet (Completed)   Comprehensive metabolic panel (Completed)   Amb Referral to Cardiac Rehabilitation   Coronary artery disease involving  native coronary artery of native heart with unstable angina pectoris (HCC)    Continue aspirin, zocor 40 mg daily.       Relevant Orders   Amb Referral to Cardiac Rehabilitation     Other   Epigastric abdominal pain     Ibgard: OTC Two capsules, three times a day, for four weeks. Take 30 to 90 minutes before meals, with water        Mixed hyperlipidemia    Continue aspirin 81 mg daily.  Change Zocor 40 mg one before bed.       Relevant Orders   Lipid panel (Completed)   Dyspnea on exertion    Fill breztri 2 puffs twice daily.  Refer to cardiopulmonary rehab.       Relevant Orders   Amb Referral to Cardiac Rehabilitation   Other fatigue - Primary    Check labs      Muscle cramps  Check labs.       Relevant Orders   T4, free (Completed)   TSH (Completed)   Phosphorus (Completed)   Magnesium (Completed)     Meds ordered this encounter  Medications   Budeson-Glycopyrrol-Formoterol (BREZTRI AEROSPHERE) 160-9-4.8 MCG/ACT AERO    Sig: Inhale 2 puffs into the lungs 2 (two) times daily as needed (for flares).    Dispense:  10.7 g    Refill:  11   Follow up: 3 months   Rochel Brome, MD

## 2021-10-07 ENCOUNTER — Encounter: Payer: Self-pay | Admitting: Family Medicine

## 2021-10-07 ENCOUNTER — Ambulatory Visit (INDEPENDENT_AMBULATORY_CARE_PROVIDER_SITE_OTHER): Payer: Medicare Other | Admitting: Family Medicine

## 2021-10-07 VITALS — BP 100/80 | HR 77 | Temp 97.3°F | Wt 116.4 lb

## 2021-10-07 DIAGNOSIS — R0609 Other forms of dyspnea: Secondary | ICD-10-CM

## 2021-10-07 DIAGNOSIS — R252 Cramp and spasm: Secondary | ICD-10-CM | POA: Diagnosis not present

## 2021-10-07 DIAGNOSIS — R1013 Epigastric pain: Secondary | ICD-10-CM

## 2021-10-07 DIAGNOSIS — I10 Essential (primary) hypertension: Secondary | ICD-10-CM

## 2021-10-07 DIAGNOSIS — I25110 Atherosclerotic heart disease of native coronary artery with unstable angina pectoris: Secondary | ICD-10-CM | POA: Diagnosis not present

## 2021-10-07 DIAGNOSIS — R5383 Other fatigue: Secondary | ICD-10-CM

## 2021-10-07 DIAGNOSIS — E782 Mixed hyperlipidemia: Secondary | ICD-10-CM

## 2021-10-07 DIAGNOSIS — I119 Hypertensive heart disease without heart failure: Secondary | ICD-10-CM

## 2021-10-07 MED ORDER — BREZTRI AEROSPHERE 160-9-4.8 MCG/ACT IN AERO
2.0000 | INHALATION_SPRAY | Freq: Two times a day (BID) | RESPIRATORY_TRACT | 11 refills | Status: DC | PRN
Start: 2021-10-07 — End: 2023-09-12

## 2021-10-07 NOTE — Patient Instructions (Addendum)
Change bp medicine (losartan) to night time before  Fill breztri 2 puffs twice daily.   Ibgard: OTC Two capsules, three times a day, for four weeks. Take 30 to 90 minutes before meals, with water   Refer to cardiopulmonary rehab.

## 2021-10-08 LAB — CBC WITH DIFFERENTIAL/PLATELET
Basophils Absolute: 0.1 10*3/uL (ref 0.0–0.2)
Basos: 1 %
EOS (ABSOLUTE): 0.6 10*3/uL — ABNORMAL HIGH (ref 0.0–0.4)
Eos: 7 %
Hematocrit: 36.3 % (ref 34.0–46.6)
Hemoglobin: 12.1 g/dL (ref 11.1–15.9)
Immature Grans (Abs): 0 10*3/uL (ref 0.0–0.1)
Immature Granulocytes: 0 %
Lymphocytes Absolute: 1.7 10*3/uL (ref 0.7–3.1)
Lymphs: 20 %
MCH: 30.9 pg (ref 26.6–33.0)
MCHC: 33.3 g/dL (ref 31.5–35.7)
MCV: 93 fL (ref 79–97)
Monocytes Absolute: 1 10*3/uL — ABNORMAL HIGH (ref 0.1–0.9)
Monocytes: 11 %
Neutrophils Absolute: 5.1 10*3/uL (ref 1.4–7.0)
Neutrophils: 61 %
Platelets: 268 10*3/uL (ref 150–450)
RBC: 3.92 x10E6/uL (ref 3.77–5.28)
RDW: 14 % (ref 11.7–15.4)
WBC: 8.6 10*3/uL (ref 3.4–10.8)

## 2021-10-08 LAB — LIPID PANEL
Chol/HDL Ratio: 2.5 ratio (ref 0.0–4.4)
Cholesterol, Total: 173 mg/dL (ref 100–199)
HDL: 68 mg/dL (ref >39)
LDL Chol Calc (NIH): 87 mg/dL (ref 0–99)
Triglycerides: 98 mg/dL (ref 0–149)
VLDL Cholesterol Cal: 18 mg/dL (ref 5–40)

## 2021-10-08 LAB — COMPREHENSIVE METABOLIC PANEL
ALT: 13 IU/L (ref 0–32)
AST: 17 IU/L (ref 0–40)
Albumin/Globulin Ratio: 2.1 (ref 1.2–2.2)
Albumin: 4.2 g/dL (ref 3.7–4.7)
Alkaline Phosphatase: 73 IU/L (ref 44–121)
BUN/Creatinine Ratio: 20 (ref 12–28)
BUN: 18 mg/dL (ref 8–27)
Bilirubin Total: 0.4 mg/dL (ref 0.0–1.2)
CO2: 21 mmol/L (ref 20–29)
Calcium: 10 mg/dL (ref 8.7–10.3)
Chloride: 102 mmol/L (ref 96–106)
Creatinine, Ser: 0.89 mg/dL (ref 0.57–1.00)
Globulin, Total: 2 g/dL (ref 1.5–4.5)
Glucose: 95 mg/dL (ref 70–99)
Potassium: 4.5 mmol/L (ref 3.5–5.2)
Sodium: 138 mmol/L (ref 134–144)
Total Protein: 6.2 g/dL (ref 6.0–8.5)
eGFR: 62 mL/min/{1.73_m2} (ref >59)

## 2021-10-08 LAB — PHOSPHORUS: Phosphorus: 3.2 mg/dL (ref 3.0–4.3)

## 2021-10-08 LAB — TSH: TSH: 0.984 u[IU]/mL (ref 0.450–4.500)

## 2021-10-08 LAB — MAGNESIUM: Magnesium: 2.2 mg/dL (ref 1.6–2.3)

## 2021-10-08 LAB — T4, FREE: Free T4: 1.63 ng/dL (ref 0.82–1.77)

## 2021-10-10 ENCOUNTER — Ambulatory Visit: Payer: Medicare Other | Admitting: Family Medicine

## 2021-10-12 ENCOUNTER — Telehealth: Payer: Self-pay

## 2021-10-12 NOTE — Chronic Care Management (AMB) (Signed)
Mailing application to patient.

## 2021-10-12 NOTE — Telephone Encounter (Signed)
Patient's husband is going to pick-up Breztri samples.

## 2021-10-12 NOTE — Telephone Encounter (Signed)
Patient and husband called as they have been to pharmacy three times to pick up Breztri but have been told pharmacy does not have anything.  Called pharmacy, medication is over $600, patient may be in donut hole. It has been placed on hold due to this.   Called husband, he is aware of this. They cannot pay this. Please advise.   Lorita Officer, CCMA 10/12/21 10:15 AM

## 2021-10-12 NOTE — Telephone Encounter (Signed)
Filling out patient assistance application for Breztri with [place] patient assistance program.  Billee Cashing, [facility] Clinical Pharmacist Assistant [phone]

## 2021-10-14 ENCOUNTER — Other Ambulatory Visit: Payer: Self-pay | Admitting: Family Medicine

## 2021-10-15 DIAGNOSIS — R252 Cramp and spasm: Secondary | ICD-10-CM | POA: Insufficient documentation

## 2021-10-15 DIAGNOSIS — R531 Weakness: Secondary | ICD-10-CM | POA: Insufficient documentation

## 2021-10-15 DIAGNOSIS — R5383 Other fatigue: Secondary | ICD-10-CM | POA: Insufficient documentation

## 2021-10-15 HISTORY — DX: Cramp and spasm: R25.2

## 2021-10-15 HISTORY — DX: Weakness: R53.1

## 2021-10-15 NOTE — Assessment & Plan Note (Signed)
Fill breztri 2 puffs twice daily.  Refer to cardiopulmonary rehab.

## 2021-10-15 NOTE — Assessment & Plan Note (Signed)
  Ibgard: OTC Two capsules, three times a day, for four weeks. Take 30 to 90 minutes before meals, with water

## 2021-10-15 NOTE — Assessment & Plan Note (Addendum)
Continue losartan 25 mg daily. Change bp medicine (losartan) to night time dosing. Continue aspirin and zocor.  Refer to cardiopulmonary rehab.

## 2021-10-15 NOTE — Assessment & Plan Note (Signed)
Check labs 

## 2021-10-15 NOTE — Assessment & Plan Note (Signed)
Continue aspirin, zocor 40 mg daily.

## 2021-10-15 NOTE — Assessment & Plan Note (Addendum)
Continue aspirin 81 mg daily.  Change Zocor 40 mg one before bed.

## 2021-10-19 ENCOUNTER — Telehealth: Payer: Self-pay | Admitting: *Deleted

## 2021-10-27 ENCOUNTER — Ambulatory Visit: Payer: Self-pay

## 2021-10-27 NOTE — Patient Outreach (Signed)
  Care Coordination   10/27/2021 Name: Leah Pittman MRN: [id number] DOB: 1930-09-05   Care Coordination Outreach Attempts:  An unsuccessful telephone outreach was attempted for a scheduled appointment today.  Follow Up Plan:  Additional outreach attempts will be made to offer the patient care coordination information and services.   Encounter Outcome:  No Answer  Care Coordination Interventions Activated:  No   Care Coordination Interventions:  No, not indicated    Rowe Pavy, RN, BSN, CEN [facility] [employer] [phone]

## 2021-10-31 ENCOUNTER — Telehealth: Payer: Self-pay | Admitting: *Deleted

## 2021-10-31 NOTE — Chronic Care Management (AMB) (Signed)
  Care Coordination   Note   10/31/2021 Name: KADAJAH KJOS MRN: [id number] DOB: 11/11/1930  Leah Pittman is a 88 y.o. year old female who sees [name], [name], MD for primary care. I reached out to [name] by phone today to offer care coordination services.  [name] was given information about Care Coordination services today including:   The Care Coordination services include support from the care team which includes your Nurse Coordinator, Clinical Social Worker, or Pharmacist.  The Care Coordination team is here to help remove barriers to the health concerns and goals most important to you. Care Coordination services are voluntary, and the patient may decline or stop services at any time by request to their care team member.   Care Coordination Consent Status: Patient did not agree to participate in care coordination services at this time.  Follow up plan:  pt appreciative but says she has many appointments coming up and declines to reschedule at this time   Encounter Outcome:  Pt. Refused

## 2021-11-06 NOTE — Progress Notes (Unsigned)
Cardiology Office Note:    Date:  11/08/2021   ID:  Leah Pittman, DOB 12/21/1930, MRN [id number]  PCP:  Rochel Brome, MD   [place] Providers Cardiologist:  Sanda Klein, MD     Referring MD: Rochel Brome, MD   Chief Complaint  Patient presents with   Follow-up    dyspnea    History of Present Illness:    Leah Pittman is a 88 y.o. female with a hx of hypertension, hypercholesterolemia, CAD s/p CABG x 4 (07/10/2018, LIMA-LAD, SVG-diagonal, sequential SVG-OM1-OM 2), postop atrial fibrillation.  She has chronic pain including chest pain.  She has chronic respiratory failure with home O2.  No renal artery stenosis by duplex in 2013.  She underwent repeat heart catheterization 07/25/2019 that showed severe two-vessel disease along with ostial left main stenosis.  Both vein grafts were patent, LIMA was not visualized and may not have been used as it was described in the OR report.  Her last echocardiogram of 07/15/2020 showed LVEF 84-16%, grade 1 diastolic dysfunction, normal RV function, and no significant valvular disease.  She has been seen in clinic and has reported pain all over her body including her chest that is worse at night.  She did stop statin therapy for 2 months but this did not lead to any improvement in her aches and pains.  She does battle early satiety and severe constipation.  She presents today for routine follow-up with a myriad of complaints. She reports ongoing weakness and hoarseness along with shortness of breath with talking. She also reports cough with mucus, which is something that started since her CABG. She points to a sore spot on her left chest which hurts when she coughs. She also reports weakness in her legs along with numbness. She describes labile BP and has been to [facility] twice in the past 6 months for hypertensive urgency, per patient. She has seen pulmonology. She is upset that our office "would not let" her see Dr. Sallyanne Kuster - I  attempted to explain that since she called with SOB she was advised to be seen ASAP with an APP. She also describes being very upset with how she was treated after her last heart catheterization.   She describes coughing up mucus which relieves her chest discomfort. She states her symptoms have been ongoing since before her heart cath in 2021.    She is upset that pulmonology recommended three times weekly breathing treatments that have to be done in [place]. She declined because she can't drive that much. In review of pulmonology note, she may have a component of asthma (PFTs 08/2020). I suspect they were recommending pulmonary rehab.    Past Medical History:  Diagnosis Date   Anxiety    Arthritis    Chest pain 2011   CARDIOLITE - no significant symptoms, EKG changes, or arrhythmias   Congenital prolapse of bladder mucosa    Diverticulosis    Dyspnea 02/16/2012   2D ECHO - EF 55-60%, normal   Esophageal dysphagia    Fatigue    GERD (gastroesophageal reflux disease)    Glaucoma    Headache(784.0)    Heart attack ([facility])    Hiatal hernia    High blood pressure 02/16/2012   RENAL DOPPLER - normal   Hyperlipidemia    Hypothyroidism    Labile hypertension    Lightheadedness    Migraine headache    Multiple allergies    Myalgia    NSTEMI (non-ST elevated myocardial infarction) (  [facility]) 07/14/2020   OSA (obstructive sleep apnea)    Pain, lower leg    Calf   Problem of menstruation    Proteinuria    Reflux    SOB (shortness of breath)    Thyroid disease    Urinary problem    Valvular heart disease    Vitamin B12 deficiency    Vitamin D deficiency     Past Surgical History:  Procedure Laterality Date   ABDOMINAL HYSTERECTOMY  1976   CHOLECYSTECTOMY  2000   COLONOSCOPY  07/27/2014   Moderate predominantly sigmoid divertuculosis. Otherwise normal colonoscopy   CORONARY ARTERY BYPASS GRAFT N/A 07/10/2018   Procedure: CORONARY ARTERY BYPASS GRAFTING (CABG), FREE LIMA;  Surgeon:  Gaye Pollack, MD;  Location: [facility];  Service: Open Heart Surgery;  Laterality: N/A;   CYST REMOVAL NECK     CYSTECTOMY  1974   Intestine   CYSTECTOMY  1996   Brain stem   ESOPHAGOGASTRODUODENOSCOPY  12/07/2016   Schatzki's ring status post esophageal dilatation. Small hiatal hernia. Mild gastritis   EYE SURGERY  2003   EYE SURGERY  07/2016   LEFT HEART CATH AND CORONARY ANGIOGRAPHY N/A 07/08/2018   Procedure: LEFT HEART CATH AND CORONARY ANGIOGRAPHY;  Surgeon: Lorretta Harp, MD;  Location: [facility];  Service: Cardiovascular;  Laterality: N/A;   LEFT HEART CATH AND CORONARY ANGIOGRAPHY N/A 07/25/2019   Procedure: LEFT HEART CATH AND CORONARY ANGIOGRAPHY;  Surgeon: Burnell Blanks, MD;  Location: [facility];  Service: Cardiovascular;  Laterality: N/A;   MOUTH SURGERY  2013   RADIOLOGY WITH ANESTHESIA N/A 11/05/2019   Procedure: MRI WITH ANESTHESIA;  Surgeon: Radiologist, Medication, MD;  Location: [facility];  Service: Radiology;  Laterality: N/A;   TEE WITHOUT CARDIOVERSION N/A 07/10/2018   Procedure: TRANSESOPHAGEAL ECHOCARDIOGRAM (TEE);  Surgeon: Gaye Pollack, MD;  Location: [facility];  Service: Open Heart Surgery;  Laterality: N/A;   TONSILLECTOMY     age 37    Current Medications: Current Meds  Medication Sig   albuterol (VENTOLIN HFA) 108 (90 Base) MCG/ACT inhaler Inhale 2 puffs into the lungs every 4 (four) hours as needed for wheezing or shortness of breath.   aspirin 81 MG tablet Take 81 mg by mouth daily.   Budeson-Glycopyrrol-Formoterol (BREZTRI AEROSPHERE) 160-9-4.8 MCG/ACT AERO Inhale 2 puffs into the lungs 2 (two) times daily as needed (for flares).   Cholecalciferol (VITAMIN D3 SUPER STRENGTH) 50 MCG (2000 UT) CAPS Take 2,000 Units by mouth daily after lunch.   dorzolamide-timolol (COSOPT) 22.3-6.8 MG/ML ophthalmic solution Place 1 drop into the left eye 2 (two) times daily.    latanoprost (XALATAN) 0.005 % ophthalmic solution Place 1 drop into the  left eye at bedtime.   levothyroxine (SYNTHROID) 100 MCG tablet Take 100 mcg by mouth daily before breakfast.   losartan (COZAAR) 25 MG tablet Take 1 tablet (25 mg total) by mouth daily.   pantoprazole (PROTONIX) 40 MG tablet TAKE 1 TABLET BY MOUTH TWICE A DAY   senna-docusate (SENOKOT-S) 8.6-50 MG tablet Take 1 tablet by mouth daily.   simvastatin (ZOCOR) 40 MG tablet Take 1 tablet (40 mg total) by mouth at bedtime.   XELPROS 0.005 % EMUL Place 1 drop into the left eye at bedtime.     Allergies:   Benadryl [diphenhydramine], Lipase concentrate-hp [digestive enzymes], Zenpep [pancrelipase (lip-prot-amyl)], Codeine, Meperidine, Other, Prednisone, Promethazine, Propranolol, Shellfish allergy, Tape, Tazarotene, and Iodinated contrast media   Social History   Socioeconomic History  Marital status: Married    Spouse name: Not on file   Number of children: 3   Years of education: college   Highest education level: Not on file  Occupational History   Occupation: Retired  Tobacco Use   Smoking status: Never   Smokeless tobacco: Never  Vaping Use   Vaping Use: Never used  Substance and Sexual Activity   Alcohol use: No   Drug use: No   Sexual activity: Not on file  Other Topics Concern   Not on file  Social History Narrative   Lives with husband in [place], [place].   Right-handed.   No daily caffeine use.   Social Determinants of Health   Financial Resource Strain: Medium Risk (10/19/2021)   Overall Financial Resource Strain (CARDIA)    Difficulty of Paying Living Expenses: Somewhat hard  Food Insecurity: No Food Insecurity (10/19/2021)   Hunger Vital Sign    Worried About Running Out of Food in the Last Year: Never true    Ran Out of Food in the Last Year: Never true  Transportation Needs: No Transportation Needs (10/19/2021)   PRAPARE - Hydrologist (Medical): No    Lack of Transportation (Non-Medical): No  Physical Activity: Inactive (05/11/2021)    Exercise Vital Sign    Days of Exercise per Week: 0 days    Minutes of Exercise per Session: 0 min  Stress: No Stress Concern Present (05/11/2021)   [place]    Feeling of Stress : Only a little  Social Connections: Not on file     Family History: The patient's family history includes Asthma in her brother; Cancer in her brother, brother, and sister; Heart attack in her father; Other in her mother; Stroke in her father.  ROS:   Please see the history of present illness.     All other systems reviewed and are negative.  EKGs/Labs/Other Studies Reviewed:    The following studies were reviewed today:  Echo 07/15/21: 1. Left ventricular ejection fraction, by estimation, is 60 to 65%. The  left ventricle has normal function. The left ventricle has no regional  wall motion abnormalities. Left ventricular diastolic parameters are  consistent with Grade I diastolic  dysfunction (impaired relaxation).   2. Right ventricular systolic function is normal. The right ventricular  size is normal. There is normal pulmonary artery systolic pressure. The  estimated right ventricular systolic pressure is 83.7 mmHg.   3. The mitral valve is grossly normal. Trivial mitral valve  regurgitation. No evidence of mitral stenosis.   4. The aortic valve is tricuspid. Aortic valve regurgitation is not  visualized. No aortic stenosis is present.   5. The inferior vena cava is normal in size with greater than 50%  respiratory variability, suggesting right atrial pressure of 3 mmHg.  EKG:  EKG is  ordered today.  The ekg ordered today demonstrates sinus rhythm with HR 65  Recent Labs: 04/21/2021: BNP 177.4 10/07/2021: ALT 13; BUN 18; Creatinine, Ser 0.89; Hemoglobin 12.1; Magnesium 2.2; Platelets 268; Potassium 4.5; Sodium 138; TSH 0.984  Recent Lipid Panel    Component Value Date/Time   CHOL 173 10/07/2021 1135   TRIG 98 10/07/2021 1135    HDL 68 10/07/2021 1135   CHOLHDL 2.5 10/07/2021 1135   CHOLHDL 3.0 11/04/2019 0617   VLDL 17 11/04/2019 0617   LDLCALC 87 10/07/2021 1135     Risk Assessment/Calculations:  Physical Exam:    VS:  BP 122/76   Pulse 65   Ht $R'5\' 4"'lF$  (1.626 m)   Wt 116 lb 12.8 oz (53 kg)   SpO2 98%   BMI 20.05 kg/m     Wt Readings from Last 3 Encounters:  11/08/21 116 lb 12.8 oz (53 kg)  10/07/21 116 lb 6.4 oz (52.8 kg)  06/29/21 119 lb 6.4 oz (54.2 kg)     GEN:  Well nourished, well developed in no acute distress HEENT: Normal NECK: No JVD; No carotid bruits LYMPHATICS: No lymphadenopathy CARDIAC: RRR, no murmurs, rubs, gallops RESPIRATORY:  Clear to auscultation without rales, wheezing or rhonchi  ABDOMEN: Soft, non-tender, non-distended MUSCULOSKELETAL:  No edema; No deformity  SKIN: Warm and dry NEUROLOGIC:  Alert and oriented x 3 PSYCHIATRIC:  Normal affect   ASSESSMENT:    1. Chest pain of uncertain etiology   2. Shortness of breath   3. Coronary artery disease involving native coronary artery of native heart with unstable angina pectoris ([facility])   4. S/P CABG x 4   5. Hyperlipidemia LDL goal <70   6. Essential hypertension   7. Postoperative atrial fibrillation (HCC)    PLAN:    In order of problems listed above:  Atypical chest pain Difficult to determine if she is having any new symptoms since [facility] 2021. She describes that a productive cough will relieve her chest pain. CP also reproducible with palpation. When I ask specifically about chest pain, she tells me her main problem is her breathing. She denies exertional chest discomfort.  EKG does not appear ischemic. I will hold off on ischemic evaluation for now.   Dyspnea Unclear etiology. Question asthma. Seems to hyperventilate and then becomes hoarse when talking. She describes orthopnea and PND. She appears euvolemic on exam. Will repeat an echo - I think she is describing a referral to pulmonary rehab  ("breathing treatments three times per week" in [place]) - I encouraged her to consider pulmonary rehab - I will send her back to pulmonology with a different provider (patient request)   CAD s/p CABG 06/2018 Heart catheterization in 2021 showed known CAD and patent SVGs Continue ASA and statin   Hyperlipidemia with LDL goal less than 70 10/07/2021: Cholesterol, Total 173; HDL 68; LDL Chol Calc (NIH) 87; Triglycerides 98 On simvastatin Statin holiday did not change her MSK pain   Hypertension No renal artery stenosis Controlled here on 25 mg losartan.   Postop A-fib No recurrence, not on anticoagulation If pulmonology workup does not seem to improve her quality of life, could consider heart monitor for Afib burden.    Overall, she is having quality of life limitations from her pain, breathing, and balance issues. Will check an echo first.   Follow up with Dr. Sallyanne Kuster, first available.     Medication Adjustments/Labs and Tests Ordered: Current medicines are reviewed at length with the patient today.  Concerns regarding medicines are outlined above.  Orders Placed This Encounter  Procedures   Ambulatory referral to Pulmonology   EKG 12-Lead   ECHOCARDIOGRAM COMPLETE   No orders of the defined types were placed in this encounter.   Patient Instructions  Medication Instructions:  Your physician recommends that you continue on your current medications as directed. Please refer to the Current Medication list given to you today.  *If you need a refill on your cardiac medications before your next appointment, please call your pharmacy*   Lab Work: NONE ordered at this time of  appointment  If you have labs (blood work) drawn today and your tests are completely normal, you will receive your results only by: [place] (if you have MyChart) OR A paper copy in the mail If you have any lab test that is abnormal or we need to change your treatment, we will call you to review  the results.   Testing/Procedures: Your physician has requested that you have an echocardiogram. Echocardiography is a painless test that uses sound waves to create images of your heart. It provides your doctor with information about the size and shape of your heart and how well your heart's chambers and valves are working. This procedure takes approximately one hour. There are no restrictions for this procedure.    Follow-Up: At [facility], you and your health needs are our priority.  As part of our continuing mission to provide you with exceptional heart care, we have created designated Provider Care Teams.  These Care Teams include your primary Cardiologist (physician) and Advanced Practice Providers (APPs -  Physician Assistants and Nurse Practitioners) who all work together to provide you with the care you need, when you need it.  We recommend signing up for the patient portal called "MyChart".  Sign up information is provided on this After Visit Summary.  MyChart is used to connect with patients for Virtual Visits (Telemedicine).  Patients are able to view lab/test results, encounter notes, upcoming appointments, etc.  Non-urgent messages can be sent to your provider as well.   To learn more about what you can do with MyChart, go to [email or web address].    Your next appointment:   Next Available    The format for your next appointment:   In Person  Provider:   Sanda Klein, MD      Signed, [place], [place]  11/08/2021 2:28 PM    [place]

## 2021-11-08 ENCOUNTER — Encounter: Payer: Self-pay | Admitting: Physician Assistant

## 2021-11-08 ENCOUNTER — Ambulatory Visit: Payer: Medicare Other | Attending: Physician Assistant | Admitting: Physician Assistant

## 2021-11-08 VITALS — BP 122/76 | HR 65 | Ht 64.0 in | Wt 116.8 lb

## 2021-11-08 DIAGNOSIS — I9789 Other postprocedural complications and disorders of the circulatory system, not elsewhere classified: Secondary | ICD-10-CM

## 2021-11-08 DIAGNOSIS — R079 Chest pain, unspecified: Secondary | ICD-10-CM

## 2021-11-08 DIAGNOSIS — Z951 Presence of aortocoronary bypass graft: Secondary | ICD-10-CM

## 2021-11-08 DIAGNOSIS — I25110 Atherosclerotic heart disease of native coronary artery with unstable angina pectoris: Secondary | ICD-10-CM | POA: Diagnosis present

## 2021-11-08 DIAGNOSIS — I10 Essential (primary) hypertension: Secondary | ICD-10-CM

## 2021-11-08 DIAGNOSIS — R0602 Shortness of breath: Secondary | ICD-10-CM

## 2021-11-08 DIAGNOSIS — I4891 Unspecified atrial fibrillation: Secondary | ICD-10-CM

## 2021-11-08 DIAGNOSIS — E785 Hyperlipidemia, unspecified: Secondary | ICD-10-CM | POA: Diagnosis present

## 2021-11-08 NOTE — Patient Instructions (Signed)
Medication Instructions:  Your physician recommends that you continue on your current medications as directed. Please refer to the Current Medication list given to you today.  *If you need a refill on your cardiac medications before your next appointment, please call your pharmacy*   Lab Work: NONE ordered at this time of appointment  If you have labs (blood work) drawn today and your tests are completely normal, you will receive your results only by: [place] (if you have MyChart) OR A paper copy in the mail If you have any lab test that is abnormal or we need to change your treatment, we will call you to review the results.   Testing/Procedures: Your physician has requested that you have an echocardiogram. Echocardiography is a painless test that uses sound waves to create images of your heart. It provides your doctor with information about the size and shape of your heart and how well your heart's chambers and valves are working. This procedure takes approximately one hour. There are no restrictions for this procedure.    Follow-Up: At [facility], you and your health needs are our priority.  As part of our continuing mission to provide you with exceptional heart care, we have created designated Provider Care Teams.  These Care Teams include your primary Cardiologist (physician) and Advanced Practice Providers (APPs -  Physician Assistants and Nurse Practitioners) who all work together to provide you with the care you need, when you need it.  We recommend signing up for the patient portal called "MyChart".  Sign up information is provided on this After Visit Summary.  MyChart is used to connect with patients for Virtual Visits (Telemedicine).  Patients are able to view lab/test results, encounter notes, upcoming appointments, etc.  Non-urgent messages can be sent to your provider as well.   To learn more about what you can do with MyChart, go to [email or web address].     Your next appointment:   Next Available    The format for your next appointment:   In Person  Provider:   Sanda Klein, MD

## 2021-11-17 ENCOUNTER — Ambulatory Visit (HOSPITAL_COMMUNITY): Payer: Medicare Other | Attending: Physician Assistant

## 2021-11-17 DIAGNOSIS — R0602 Shortness of breath: Secondary | ICD-10-CM | POA: Insufficient documentation

## 2021-11-17 LAB — ECHOCARDIOGRAM COMPLETE
Area-P 1/2: 3.65 cm2
S' Lateral: 2.3 cm

## 2021-11-25 ENCOUNTER — Other Ambulatory Visit: Payer: Self-pay | Admitting: Family Medicine

## 2021-11-29 ENCOUNTER — Other Ambulatory Visit: Payer: Self-pay | Admitting: Family Medicine

## 2021-12-08 ENCOUNTER — Ambulatory Visit (INDEPENDENT_AMBULATORY_CARE_PROVIDER_SITE_OTHER): Payer: Medicare Other | Admitting: Family Medicine

## 2021-12-08 VITALS — BP 122/70 | HR 64 | Temp 97.5°F | Ht 64.0 in | Wt 119.0 lb

## 2021-12-08 DIAGNOSIS — I119 Hypertensive heart disease without heart failure: Secondary | ICD-10-CM | POA: Diagnosis not present

## 2021-12-08 DIAGNOSIS — R5383 Other fatigue: Secondary | ICD-10-CM

## 2021-12-08 DIAGNOSIS — E039 Hypothyroidism, unspecified: Secondary | ICD-10-CM | POA: Diagnosis not present

## 2021-12-08 DIAGNOSIS — E782 Mixed hyperlipidemia: Secondary | ICD-10-CM | POA: Diagnosis not present

## 2021-12-08 DIAGNOSIS — R7303 Prediabetes: Secondary | ICD-10-CM

## 2021-12-08 DIAGNOSIS — I110 Hypertensive heart disease with heart failure: Secondary | ICD-10-CM

## 2021-12-08 DIAGNOSIS — R1013 Epigastric pain: Secondary | ICD-10-CM

## 2021-12-08 DIAGNOSIS — R0609 Other forms of dyspnea: Secondary | ICD-10-CM | POA: Diagnosis not present

## 2021-12-08 NOTE — Progress Notes (Signed)
Subjective:  Patient ID: Leah Pittman, female    DOB: Mar 02, 1930  Age: 88 y.o. MRN: [id number]  Chief Complaint  Patient presents with   Fatigue    HPI Patient complains that her fatigue is worse. Patient is not sleeping poorly. Polyuria.  Complaining pressure around eyes. Nausea every day. Poor appetite. Nothing sounds good. She is eating anyways. Bms are black x 1 year. Not taking iron or pepto bismol. Takes miralax twice daily and senokot-s once daily.. Takes 4-5 days to have a BM. I gave patient IBgard. Takes one daily in am. Helps some.  Did not eat since 8 am today.  Asthma/COPD: taking breztri 2 puffs twice daily and albuterol HFA four times a day as needed. Helps her breathing. Throat is always raspy. Continue albuterol. CORONARY ARTERY DISEASE:  on Crestor 40 mg once daily.  Hypertensive heart disease: ON losartan 25 mg daily, Aspirin 81 mg daily.  GERD: on protonix 40 mg twice daily Hypothyroidism: on synthroid 100 mcg once daily in am.  Vitamin D: 2000 U once daily.  Glaucoma: on cosopt, xalatan, xelpros.   Current Outpatient Medications on File Prior to Visit  Medication Sig Dispense Refill   albuterol (VENTOLIN HFA) 108 (90 Base) MCG/ACT inhaler Inhale 2 puffs into the lungs every 4 (four) hours as needed for wheezing or shortness of breath. 18 g 0   aspirin 81 MG tablet Take 81 mg by mouth daily.     Budeson-Glycopyrrol-Formoterol (BREZTRI AEROSPHERE) 160-9-4.8 MCG/ACT AERO Inhale 2 puffs into the lungs 2 (two) times daily as needed (for flares). 10.7 g 11   Cholecalciferol (VITAMIN D3 SUPER STRENGTH) 50 MCG (2000 UT) CAPS Take 2,000 Units by mouth daily after lunch.     dorzolamide-timolol (COSOPT) 22.3-6.8 MG/ML ophthalmic solution Place 1 drop into the left eye 2 (two) times daily.      latanoprost (XALATAN) 0.005 % ophthalmic solution Place 1 drop into the left eye at bedtime.     levothyroxine (SYNTHROID) 100 MCG tablet Take 100 mcg by mouth daily before  breakfast.     losartan (COZAAR) 25 MG tablet TAKE 1 TABLET (25 MG TOTAL) BY MOUTH DAILY. 90 tablet 0   pantoprazole (PROTONIX) 40 MG tablet TAKE 1 TABLET BY MOUTH TWICE A DAY 180 tablet 1   senna-docusate (SENOKOT-S) 8.6-50 MG tablet Take 1 tablet by mouth daily.     XELPROS 0.005 % EMUL Place 1 drop into the left eye at bedtime.     No current facility-administered medications on file prior to visit.   Past Medical History:  Diagnosis Date   Anxiety    Arthritis    Chest pain 2011   CARDIOLITE - no significant symptoms, EKG changes, or arrhythmias   Congenital prolapse of bladder mucosa    Diverticulosis    Dyspnea 02/16/2012   2D ECHO - EF 55-60%, normal   Esophageal dysphagia    Fatigue    GERD (gastroesophageal reflux disease)    Glaucoma    Headache(784.0)    Heart attack ([place])    Hiatal hernia    High blood pressure 02/16/2012   RENAL DOPPLER - normal   Hyperlipidemia    Hypothyroidism    Labile hypertension    Lightheadedness    Migraine headache    Multiple allergies    Myalgia    NSTEMI (non-ST elevated myocardial infarction) ([place]) 07/14/2020   OSA (obstructive sleep apnea)    Pain, lower leg    Calf   Problem of menstruation  Proteinuria    Reflux    SOB (shortness of breath)    Thyroid disease    Urinary problem    Valvular heart disease    Vitamin B12 deficiency    Vitamin D deficiency    Past Surgical History:  Procedure Laterality Date   ABDOMINAL HYSTERECTOMY  1976   CHOLECYSTECTOMY  2000   COLONOSCOPY  07/27/2014   Moderate predominantly sigmoid divertuculosis. Otherwise normal colonoscopy   CORONARY ARTERY BYPASS GRAFT N/A 07/10/2018   Procedure: CORONARY ARTERY BYPASS GRAFTING (CABG), FREE LIMA;  Surgeon: Gaye Pollack, MD;  Location: [facility];  Service: Open Heart Surgery;  Laterality: N/A;   CYST REMOVAL NECK     CYSTECTOMY  1974   Intestine   CYSTECTOMY  1996   Brain stem   ESOPHAGOGASTRODUODENOSCOPY  12/07/2016   Schatzki's ring  status post esophageal dilatation. Small hiatal hernia. Mild gastritis   EYE SURGERY  2003   EYE SURGERY  07/2016   LEFT HEART CATH AND CORONARY ANGIOGRAPHY N/A 07/08/2018   Procedure: LEFT HEART CATH AND CORONARY ANGIOGRAPHY;  Surgeon: Lorretta Harp, MD;  Location: [facility];  Service: Cardiovascular;  Laterality: N/A;   LEFT HEART CATH AND CORONARY ANGIOGRAPHY N/A 07/25/2019   Procedure: LEFT HEART CATH AND CORONARY ANGIOGRAPHY;  Surgeon: Burnell Blanks, MD;  Location: [facility];  Service: Cardiovascular;  Laterality: N/A;   MOUTH SURGERY  2013   RADIOLOGY WITH ANESTHESIA N/A 11/05/2019   Procedure: MRI WITH ANESTHESIA;  Surgeon: Radiologist, Medication, MD;  Location: [facility];  Service: Radiology;  Laterality: N/A;   TEE WITHOUT CARDIOVERSION N/A 07/10/2018   Procedure: TRANSESOPHAGEAL ECHOCARDIOGRAM (TEE);  Surgeon: Gaye Pollack, MD;  Location: [facility];  Service: Open Heart Surgery;  Laterality: N/A;   TONSILLECTOMY     age 60    Family History  Problem Relation Age of Onset   Other Mother        blood disorder - unsure of name   Heart attack Father    Stroke Father    Cancer Sister    Cancer Brother    Asthma Brother    Cancer Brother    Social History   Socioeconomic History   Marital status: Married    Spouse name: Not on file   Number of children: 3   Years of education: college   Highest education level: Not on file  Occupational History   Occupation: Retired  Tobacco Use   Smoking status: Never   Smokeless tobacco: Never  Scientific laboratory technician Use: Never used  Substance and Sexual Activity   Alcohol use: No   Drug use: No   Sexual activity: Not on file  Other Topics Concern   Not on file  Social History Narrative   Lives with husband in [place], [place].   Right-handed.   No daily caffeine use.   Social Determinants of Health   Financial Resource Strain: Medium Risk (10/19/2021)   Overall Financial Resource Strain (CARDIA)     Difficulty of Paying Living Expenses: Somewhat hard  Food Insecurity: No Food Insecurity (10/19/2021)   Hunger Vital Sign    Worried About Running Out of Food in the Last Year: Never true    Ran Out of Food in the Last Year: Never true  Transportation Needs: No Transportation Needs (10/19/2021)   PRAPARE - Hydrologist (Medical): No    Lack of Transportation (Non-Medical): No  Physical Activity: Inactive (05/11/2021)  Exercise Vital Sign    Days of Exercise per Week: 0 days    Minutes of Exercise per Session: 0 min  Stress: No Stress Concern Present (05/11/2021)   [place]    Feeling of Stress : Only a little  Social Connections: Not on file    Review of Systems  Constitutional:  Positive for fatigue. Negative for chills.  HENT:  Positive for voice change. Negative for congestion, ear pain, nosebleeds, sinus pain and sore throat.   Respiratory:  Positive for shortness of breath. Negative for cough.   Cardiovascular:  Positive for chest pain. Negative for leg swelling.  Gastrointestinal:  Positive for abdominal pain, constipation and nausea. Negative for diarrhea and vomiting.  Genitourinary:  Negative for dysuria, frequency and menstrual problem.  Musculoskeletal:  Negative for arthralgias, back pain and myalgias.  Neurological:  Negative for dizziness, numbness and headaches.     Objective:  BP 122/70   Pulse 64   Temp (!) 97.5 F (36.4 C)   Ht 5\' 4"  (1.626 m)   Wt 119 lb (54 kg)   SpO2 98%   BMI 20.43 kg/m      12/08/2021    2:19 PM 11/08/2021    1:09 PM 10/07/2021   11:07 AM  BP/Weight  Systolic BP 123XX123 123XX123 123XX123  Diastolic BP 70 76 80  Wt. (Lbs) 119 116.8   BMI 20.43 kg/m2 20.05 kg/m2     Physical Exam Vitals reviewed.  Constitutional:      Appearance: Normal appearance. She is normal weight.  HENT:     Nose: No congestion.     Mouth/Throat:     Pharynx: No  oropharyngeal exudate or posterior oropharyngeal erythema.  Neck:     Vascular: No carotid bruit.  Cardiovascular:     Rate and Rhythm: Normal rate and regular rhythm.     Heart sounds: Normal heart sounds.     Comments: Costochondral joints tender bl.  Pulmonary:     Effort: Pulmonary effort is normal. No respiratory distress.     Breath sounds: Normal breath sounds.  Abdominal:     General: Abdomen is flat. Bowel sounds are normal.     Palpations: Abdomen is soft.     Tenderness: There is abdominal tenderness.  Neurological:     Mental Status: She is alert and oriented to person, place, and time.  Psychiatric:        Mood and Affect: Mood normal.        Behavior: Behavior normal.     Diabetic Foot Exam - Simple   No data filed      Lab Results  Component Value Date   WBC 9.1 12/08/2021   HGB 12.1 12/08/2021   HCT 36.5 12/08/2021   PLT 231 12/08/2021   GLUCOSE 93 12/08/2021   CHOL 180 12/08/2021   TRIG 75 12/08/2021   HDL 76 12/08/2021   LDLCALC 90 12/08/2021   ALT 16 12/08/2021   AST 17 12/08/2021   NA 142 12/08/2021   K 5.1 12/08/2021   CL 106 12/08/2021   CREATININE 0.97 12/08/2021   BUN 17 12/08/2021   CO2 24 12/08/2021   TSH 0.984 10/07/2021   INR 0.9 07/14/2020   HGBA1C 5.9 (H) 12/08/2021      Assessment & Plan:   Problem List Items Addressed This Visit       Cardiovascular and Mediastinum   Hypertension with heart disease - Primary    Well controlled.  No changes to medicines.  losartan 25 mg daily, Aspirin 81 mg daily.  Continue to work on eating a healthy diet and exercise.  Labs drawn today.       Relevant Orders   CBC with Differential/Platelet (Completed)   Comprehensive metabolic panel (Completed)   Cardiovascular Risk Assessment (Completed)     Endocrine   Acquired hypothyroidism    Previously well controlled Continue Synthroid at current dose  Recheck TSH and adjust Synthroid as indicated         Other   Epigastric pain     Patient has seen Dr. Lyndel Safe and Dr. Lyda Jester.  Despite thorough work up, patient continues to have bowel issues and abdominal pain daily.  Refer to [facility].  I have been unable to help patient.       Relevant Orders   Ambulatory referral to Gastroenterology   Mixed hyperlipidemia    Well controlled.  No changes to medicines.Crestor 40 mg once daily Continue to work on eating a healthy diet and exercise.  Labs drawn today.       Relevant Orders   Lipid panel (Completed)   Dyspnea on exertion   Other fatigue    Check labs. Unknown etiology.       Prediabetes    Hemoglobin A1c 5.9%, 3 month avg of blood sugars, is in prediabetic range.  In order to prevent progression to diabetes, recommend low carb diet and regular exercise      Relevant Orders   Hemoglobin A1c (Completed)  .  No orders of the defined types were placed in this encounter.   Orders Placed This Encounter  Procedures   CBC with Differential/Platelet   Comprehensive metabolic panel   Lipid panel   Hemoglobin A1c   Cardiovascular Risk Assessment   Ambulatory referral to Gastroenterology   Follow-up: Return in about 3 months (around 03/10/2022) for chronic fasting.  An After Visit Summary was printed and given to the patient.  Rochel Brome, MD [facility] [phone]

## 2021-12-09 LAB — CBC WITH DIFFERENTIAL/PLATELET
Basophils Absolute: 0.1 10*3/uL (ref 0.0–0.2)
Basos: 1 %
EOS (ABSOLUTE): 0.4 10*3/uL (ref 0.0–0.4)
Eos: 4 %
Hematocrit: 36.5 % (ref 34.0–46.6)
Hemoglobin: 12.1 g/dL (ref 11.1–15.9)
Immature Grans (Abs): 0.1 10*3/uL (ref 0.0–0.1)
Immature Granulocytes: 1 %
Lymphocytes Absolute: 2.4 10*3/uL (ref 0.7–3.1)
Lymphs: 26 %
MCH: 30.9 pg (ref 26.6–33.0)
MCHC: 33.2 g/dL (ref 31.5–35.7)
MCV: 93 fL (ref 79–97)
Monocytes Absolute: 1 10*3/uL — ABNORMAL HIGH (ref 0.1–0.9)
Monocytes: 11 %
Neutrophils Absolute: 5.2 10*3/uL (ref 1.4–7.0)
Neutrophils: 57 %
Platelets: 231 10*3/uL (ref 150–450)
RBC: 3.92 x10E6/uL (ref 3.77–5.28)
RDW: 14.2 % (ref 11.7–15.4)
WBC: 9.1 10*3/uL (ref 3.4–10.8)

## 2021-12-09 LAB — LIPID PANEL
Chol/HDL Ratio: 2.4 ratio (ref 0.0–4.4)
Cholesterol, Total: 180 mg/dL (ref 100–199)
HDL: 76 mg/dL (ref >39)
LDL Chol Calc (NIH): 90 mg/dL (ref 0–99)
Triglycerides: 75 mg/dL (ref 0–149)
VLDL Cholesterol Cal: 14 mg/dL (ref 5–40)

## 2021-12-09 LAB — COMPREHENSIVE METABOLIC PANEL
ALT: 16 IU/L (ref 0–32)
AST: 17 IU/L (ref 0–40)
Albumin/Globulin Ratio: 2 (ref 1.2–2.2)
Albumin: 4.6 g/dL (ref 3.7–4.7)
Alkaline Phosphatase: 68 IU/L (ref 44–121)
BUN/Creatinine Ratio: 18 (ref 12–28)
BUN: 17 mg/dL (ref 8–27)
Bilirubin Total: 0.4 mg/dL (ref 0.0–1.2)
CO2: 24 mmol/L (ref 20–29)
Calcium: 10.5 mg/dL — ABNORMAL HIGH (ref 8.7–10.3)
Chloride: 106 mmol/L (ref 96–106)
Creatinine, Ser: 0.97 mg/dL (ref 0.57–1.00)
Globulin, Total: 2.3 g/dL (ref 1.5–4.5)
Glucose: 93 mg/dL (ref 70–99)
Potassium: 5.1 mmol/L (ref 3.5–5.2)
Sodium: 142 mmol/L (ref 134–144)
Total Protein: 6.9 g/dL (ref 6.0–8.5)
eGFR: 56 mL/min/{1.73_m2} — ABNORMAL LOW (ref >59)

## 2021-12-09 LAB — HEMOGLOBIN A1C
Est. average glucose Bld gHb Est-mCnc: 123 mg/dL
Hgb A1c MFr Bld: 5.9 % — ABNORMAL HIGH (ref 4.8–5.6)

## 2021-12-09 LAB — CARDIOVASCULAR RISK ASSESSMENT

## 2021-12-12 ENCOUNTER — Other Ambulatory Visit: Payer: Self-pay

## 2021-12-12 MED ORDER — ROSUVASTATIN CALCIUM 40 MG PO TABS: 40.0000 mg | ORAL_TABLET | Freq: Every day | ORAL | 0 refills | Status: DC

## 2021-12-15 DIAGNOSIS — R7303 Prediabetes: Secondary | ICD-10-CM

## 2021-12-15 HISTORY — DX: Prediabetes: R73.03

## 2021-12-15 NOTE — Assessment & Plan Note (Signed)
>>  ASSESSMENT AND PLAN FOR HYPERTENSION WITH HEART DISEASE WRITTEN ON 12/15/2021  8:47 AM BY LEAL-BORJAS, Sundra Haddix I, CMA  Well controlled.  No changes to medicines.  losartan  25 mg daily, Aspirin  81 mg daily.  Continue to work on eating a healthy diet and exercise.  Labs drawn today.

## 2021-12-15 NOTE — Assessment & Plan Note (Signed)
Well controlled.  No changes to medicines.  losartan 25 mg daily, Aspirin 81 mg daily.  Continue to work on eating a healthy diet and exercise.  Labs drawn today.

## 2021-12-15 NOTE — Assessment & Plan Note (Signed)
Previously well controlled Continue Synthroid at current dose  Recheck TSH and adjust Synthroid as indicated   

## 2021-12-15 NOTE — Assessment & Plan Note (Signed)
Hemoglobin A1c 5.9%, 3 month avg of blood sugars, is in prediabetic range.  In order to prevent progression to diabetes, recommend low carb diet and regular exercise  

## 2021-12-15 NOTE — Assessment & Plan Note (Addendum)
Patient has seen Dr. Lyndel Safe and Dr. Lyda Jester.  Despite thorough work up, patient continues to have bowel issues and abdominal pain daily.  Refer to [facility].  I have been unable to help patient.

## 2021-12-15 NOTE — Assessment & Plan Note (Addendum)
Well controlled.  No changes to medicines.Crestor 40 mg once daily Continue to work on eating a healthy diet and exercise.  Labs drawn today.

## 2021-12-16 ENCOUNTER — Encounter: Payer: Self-pay | Admitting: Family Medicine

## 2021-12-16 NOTE — Assessment & Plan Note (Signed)
Check labs. Unknown etiology.

## 2022-01-26 ENCOUNTER — Other Ambulatory Visit: Payer: Self-pay | Admitting: Family Medicine

## 2022-01-26 DIAGNOSIS — R1319 Other dysphagia: Secondary | ICD-10-CM

## 2022-02-03 ENCOUNTER — Encounter: Payer: Self-pay | Admitting: Cardiovascular Disease

## 2022-02-03 ENCOUNTER — Ambulatory Visit: Payer: Medicare Other | Attending: Cardiovascular Disease | Admitting: Cardiovascular Disease

## 2022-02-03 VITALS — BP 194/64 | HR 64 | Ht 64.0 in | Wt 115.4 lb

## 2022-02-03 DIAGNOSIS — E785 Hyperlipidemia, unspecified: Secondary | ICD-10-CM | POA: Insufficient documentation

## 2022-02-03 DIAGNOSIS — I4891 Unspecified atrial fibrillation: Secondary | ICD-10-CM | POA: Diagnosis present

## 2022-02-03 DIAGNOSIS — I9789 Other postprocedural complications and disorders of the circulatory system, not elsewhere classified: Secondary | ICD-10-CM | POA: Insufficient documentation

## 2022-02-03 DIAGNOSIS — I10 Essential (primary) hypertension: Secondary | ICD-10-CM | POA: Insufficient documentation

## 2022-02-03 DIAGNOSIS — I25810 Atherosclerosis of coronary artery bypass graft(s) without angina pectoris: Secondary | ICD-10-CM | POA: Diagnosis not present

## 2022-02-03 DIAGNOSIS — R059 Cough, unspecified: Secondary | ICD-10-CM | POA: Insufficient documentation

## 2022-02-03 MED ORDER — BENZONATATE 100 MG PO CAPS: 100.0000 mg | ORAL_CAPSULE | Freq: Three times a day (TID) | ORAL | 1 refills | Status: DC | PRN

## 2022-02-03 NOTE — Patient Instructions (Signed)
Medication Instructions:  Your physician has recommended you make the following change in your medication:  START: Tessalon Pearls every 8 hours as needed for cough  OTC: Magnesium Oxide 400mg  1-2 times daily for muscle cramps and constipation  *If you need a refill on your cardiac medications before your next appointment, please call your pharmacy*   Lab Work: NONE If you have labs (blood work) drawn today and your tests are completely normal, you will receive your results only by: MyChart Message (if you have MyChart) OR A paper copy in the mail If you have any lab test that is abnormal or we need to change your treatment, we will call you to review the results.   Testing/Procedures: A chest x-ray takes a picture of the organs and structures inside the chest, including the heart, lungs, and blood vessels. This test can show several things, including, whether the heart is enlarges; whether fluid is building up in the lungs; and whether pacemaker / defibrillator leads are still in place.    Follow-Up: At [facility], you and your health needs are our priority.  As part of our continuing mission to provide you with exceptional heart care, we have created designated Provider Care Teams.  These Care Teams include your primary Cardiologist (physician) and Advanced Practice Providers (APPs -  Physician Assistants and Nurse Practitioners) who all work together to provide you with the care you need, when you need it.  We recommend signing up for the patient portal called "MyChart".  Sign up information is provided on this After Visit Summary.  MyChart is used to connect with patients for Virtual Visits (Telemedicine).  Patients are able to view lab/test results, encounter notes, upcoming appointments, etc.  Non-urgent messages can be sent to your provider as well.   To learn more about what you can do with MyChart, go to [facility].    Your next appointment:   1  year(s)  The format for your next appointment:   In Person  Provider:   [email or web address], MD

## 2022-02-05 ENCOUNTER — Encounter: Payer: Self-pay | Admitting: Cardiovascular Disease

## 2022-02-05 NOTE — Progress Notes (Signed)
Cardiology Office Note    Date:  02/05/2022   ID:  Leah Pittman, DOB [birth year], MRN [id number]  PCP:  Blane Ohara, MD  Cardiologist:   Thurmon Fair, MD   Chief Complaint  Patient presents with   Coronary Artery Disease    History of Present Illness:  Leah Pittman is a 88 y.o. female with HTN and severe hypercholesterolemia, labile HTN and multivessel CAD s/p CABG (07/10/2018, [place], LIMA to LAD, SVG to diagonal, sequential SVG to OM1+OM 2), complicated by transient postoperative atrial fibrillation.  She is here with her daughter. Her husband Leah Pittman who is also my patient.  As has been the case for a year, Leah Pittman continues to have a variety of different complaints of pain in her chest.  She has a pain in her left chest that occurs if she picks up her cat. (Importantly, she did not describe the pattern of angina that had come to associated with her ischemic coronary syndromes.  She used to describe this with both hands pushing down deep in her epigastric area. ).  She remains quite sedentary.  She has shortness of breath "all the time", but she does not have any worsening of this with usual activity. She has a exhausting, chronic cough.  She produces loose mucus that is clear in color.  Is been going on for about 3 weeks.  It makes it difficult to sleep at night.  For a long time she has been describing hoarseness when she wakes up in the morning and the need to cough every morning.  She does not have fever or chills or recent sick contacts.  She has not had lower extremity edema, claudication or new focal neurological complaints.  She has chronic vision problems.  She complains of muscle cramps at night and constipation.  Most recent lipid profile from October 2023 shows elevated LDL of 90 but good HDL at 76.  After those labs came back, she was switched to maximum dose rosuvastatin.  Hemoglobin A1c is borderline at 5.9%.  The echocardiogram performed 11/17/2021 was a normal  study.   She had a barium swallow that appeared to show normal findings.  She has had a cholecystectomy.  In 2013 renal artery duplex did not show evidence of renal artery stenosis.  Pre-bypass carotid scan showed no evidence of significant stenoses and there was antegrade flow in both vertebral arteries.  She has normal left ventricular systolic function and has diastolic dysfunction which appears appropriate for age.  Past Medical History:  Diagnosis Date   Anxiety    Arthritis    Chest pain 2011   CARDIOLITE - no significant symptoms, EKG changes, or arrhythmias   Congenital prolapse of bladder mucosa    Diverticulosis    Dyspnea 02/16/2012   2D ECHO - EF 55-60%, normal   Esophageal dysphagia    Fatigue    GERD (gastroesophageal reflux disease)    Glaucoma    Headache(784.0)    Heart attack (HCC)    Hiatal hernia    High blood pressure 02/16/2012   RENAL DOPPLER - normal   Hyperlipidemia    Hypothyroidism    Labile hypertension    Lightheadedness    Migraine headache    Multiple allergies    Myalgia    NSTEMI (non-ST elevated myocardial infarction) (HCC) 07/14/2020   OSA (obstructive sleep apnea)    Pain, lower leg    Calf   Problem of menstruation    Proteinuria    Reflux  SOB (shortness of breath)    Thyroid disease    Urinary problem    Valvular heart disease    Vitamin B12 deficiency    Vitamin D deficiency     Past Surgical History:  Procedure Laterality Date   ABDOMINAL HYSTERECTOMY  1976   CHOLECYSTECTOMY  2000   COLONOSCOPY  07/27/2014   Moderate predominantly sigmoid divertuculosis. Otherwise normal colonoscopy   CORONARY ARTERY BYPASS GRAFT N/A 07/10/2018   Procedure: CORONARY ARTERY BYPASS GRAFTING (CABG), FREE LIMA;  Surgeon: Gaye Pollack, MD;  Location: [facility];  Service: Open Heart Surgery;  Laterality: N/A;   CYST REMOVAL NECK     CYSTECTOMY  1974   Intestine   CYSTECTOMY  1996   Brain stem   ESOPHAGOGASTRODUODENOSCOPY  12/07/2016    Schatzki's ring status post esophageal dilatation. Small hiatal hernia. Mild gastritis   EYE SURGERY  2003   EYE SURGERY  07/2016   LEFT HEART CATH AND CORONARY ANGIOGRAPHY N/A 07/08/2018   Procedure: LEFT HEART CATH AND CORONARY ANGIOGRAPHY;  Surgeon: Lorretta Harp, MD;  Location: [facility];  Service: Cardiovascular;  Laterality: N/A;   LEFT HEART CATH AND CORONARY ANGIOGRAPHY N/A 07/25/2019   Procedure: LEFT HEART CATH AND CORONARY ANGIOGRAPHY;  Surgeon: Burnell Blanks, MD;  Location: [facility];  Service: Cardiovascular;  Laterality: N/A;   MOUTH SURGERY  2013   RADIOLOGY WITH ANESTHESIA N/A 11/05/2019   Procedure: MRI WITH ANESTHESIA;  Surgeon: Radiologist, Medication, MD;  Location: [facility];  Service: Radiology;  Laterality: N/A;   TEE WITHOUT CARDIOVERSION N/A 07/10/2018   Procedure: TRANSESOPHAGEAL ECHOCARDIOGRAM (TEE);  Surgeon: Gaye Pollack, MD;  Location: [facility];  Service: Open Heart Surgery;  Laterality: N/A;   TONSILLECTOMY     age 66    Current Medications: Outpatient Medications Prior to Visit  Medication Sig Dispense Refill   aspirin 81 MG tablet Take 81 mg by mouth daily.     Budeson-Glycopyrrol-Formoterol (BREZTRI AEROSPHERE) 160-9-4.8 MCG/ACT AERO Inhale 2 puffs into the lungs 2 (two) times daily as needed (for flares). 10.7 g 11   Cholecalciferol (VITAMIN D3 SUPER STRENGTH) 50 MCG (2000 UT) CAPS Take 2,000 Units by mouth daily after lunch.     dorzolamide-timolol (COSOPT) 22.3-6.8 MG/ML ophthalmic solution Place 1 drop into the left eye 2 (two) times daily.      latanoprost (XALATAN) 0.005 % ophthalmic solution Place 1 drop into the left eye at bedtime.     levothyroxine (SYNTHROID) 100 MCG tablet Take 100 mcg by mouth daily before breakfast.     losartan (COZAAR) 25 MG tablet TAKE 1 TABLET (25 MG TOTAL) BY MOUTH DAILY. 90 tablet 0   pantoprazole (PROTONIX) 40 MG tablet TAKE 1 TABLET BY MOUTH TWICE A DAY 180 tablet 1   rosuvastatin (CRESTOR) 40 MG  tablet Take 1 tablet (40 mg total) by mouth daily. 90 tablet 0   senna-docusate (SENOKOT-S) 8.6-50 MG tablet Take 1 tablet by mouth daily.     XELPROS 0.005 % EMUL Place 1 drop into the left eye at bedtime.     albuterol (VENTOLIN HFA) 108 (90 Base) MCG/ACT inhaler Inhale 2 puffs into the lungs every 4 (four) hours as needed for wheezing or shortness of breath. (Patient not taking: Reported on 02/03/2022) 18 g 0   No facility-administered medications prior to visit.     Allergies:   Benadryl [diphenhydramine], Lipase concentrate-hp [digestive enzymes], Zenpep [pancrelipase (lip-prot-amyl)], Codeine, Meperidine, Other, Prednisone, Promethazine, Propranolol, Shellfish allergy, Tape,  Tazarotene, and Iodinated contrast media   Social History   Socioeconomic History   Marital status: Married    Spouse name: Not on file   Number of children: 3   Years of education: college   Highest education level: Not on file  Occupational History   Occupation: Retired  Tobacco Use   Smoking status: Never   Smokeless tobacco: Never  Vaping Use   Vaping Use: Never used  Substance and Sexual Activity   Alcohol use: No   Drug use: No   Sexual activity: Not on file  Other Topics Concern   Not on file  Social History Narrative   Lives with husband in [place], [place].   Right-handed.   No daily caffeine use.   Social Determinants of Health   Financial Resource Strain: Medium Risk (10/19/2021)   Overall Financial Resource Strain (CARDIA)    Difficulty of Paying Living Expenses: Somewhat hard  Food Insecurity: No Food Insecurity (10/19/2021)   Hunger Vital Sign    Worried About Running Out of Food in the Last Year: Never true    Ran Out of Food in the Last Year: Never true  Transportation Needs: No Transportation Needs (10/19/2021)   PRAPARE - Hydrologist (Medical): No    Lack of Transportation (Non-Medical): No  Physical Activity: Inactive (05/11/2021)   Exercise Vital  Sign    Days of Exercise per Week: 0 days    Minutes of Exercise per Session: 0 min  Stress: No Stress Concern Present (05/11/2021)   [place]    Feeling of Stress : Only a little  Social Connections: Not on file     Family History:  The patient's family history includes Asthma in her brother; Cancer in her brother, brother, and sister; Heart attack in her father; Other in her mother; Stroke in her father.   ROS:   Please see the history of present illness.    ROS All other systems reviewed and are negative.   PHYSICAL EXAM:   VS:  BP (!) 194/64 (BP Location: Left Arm, Patient Position: Sitting, Cuff Size: Normal)   Pulse 64   Ht 5\' 4"  (1.626 m)   Wt 115 lb 6.4 oz (52.3 kg)   SpO2 100%   BMI 19.81 kg/m     Recheck BP 150/62   General: Alert, oriented x3, no distress, very lean.  Appears younger than stated age. Head: no evidence of trauma, PERRL, EOMI, no exophtalmos or lid lag, no myxedema, no xanthelasma; normal ears, nose and oropharynx Neck: normal jugular venous pulsations and no hepatojugular reflux; brisk carotid pulses without delay and faint right carotid bruit Chest: She has bilateral rhonchi, but is otherwise clear to auscultation, no signs of consolidation by percussion or palpation, normal fremitus, symmetrical and full respiratory excursions Cardiovascular: normal position and quality of the apical impulse, regular rhythm, normal first and second heart sounds, no murmurs, rubs or gallops Abdomen: no tenderness or distention, no masses by palpation, no abnormal pulsatility or arterial bruits, normal bowel sounds, no hepatosplenomegaly Extremities: no clubbing, cyanosis or edema; 2+ radial, ulnar and brachial pulses bilaterally; 2+ right femoral, posterior tibial and dorsalis pedis pulses; 2+ left femoral, posterior tibial and dorsalis pedis pulses; no subclavian or femoral bruits Neurological: grossly  nonfocal Psych: Normal mood and affect    Wt Readings from Last 3 Encounters:  02/03/22 115 lb 6.4 oz (52.3 kg)  12/08/21 119 lb (54 kg)  11/08/21 116 lb 12.8 oz (53 kg)    Studies/Labs Reviewed:  CT Chest 06-10-2020 Small sliding-type hiatal hernia. Calcified right hilar lymph nodes are noted consistent with prior granulomatous disease. Bilateral scarring and bronchiectasis is noted. No acute pulmonary abnormality is noted. Aortic Atherosclerosis   ECHO 11/17/2021  1. Left ventricular ejection fraction, by estimation, is 60 to 65%. The  left ventricle has normal function. The left ventricle has no regional  wall motion abnormalities. Left ventricular diastolic parameters were  normal.   2. Right ventricular systolic function is normal. The right ventricular  size is normal.   3. The mitral valve is normal in structure. Trivial mitral valve  regurgitation. No evidence of mitral stenosis.   4. The aortic valve is tricuspid. Aortic valve regurgitation is mild.  Aortic valve sclerosis is present, with no evidence of aortic valve  stenosis.   5. The inferior vena cava is normal in size with greater than 50%  respiratory variability, suggesting right atrial pressure of 3 mmHg.    EKG:  EKG is ordered today.  It shows normal sinus rhythm and is a completely normal tracing.    Recent Labs: 09/23/2018 Total cholesterol 170, HDL 64, LDL 90, triglycerides 185 Hemoglobin A1c 5.8% Hemoglobin 12.0, creatinine 0.84, potassium 4.3, normal TSH and liver function tests  01/21/2019 Total cholesterol 164, HDL 69, LDL 69, triglycerides 90 Hemoglobin A1c 5.7%, hemoglobin 12.6, creatinine 0.88, potassium 5.1, normal liver function tests, TSH 0.441   11/04/2019 Total cholesterol 206, HDL 69, LDL 120, triglycerides 86 12/22/2019 Hemoglobin 12.4, creatinine 0.94, potassium 4.8, magnesium 2.2, normal liver function test, TSH 0.87  12/08/2021 Cholesterol 180, HDL 76, LDL 90, triglycerides  75 Hemoglobin A1c 5.9% Hemoglobin 12.1 Creatinine 0.97, potassium 5.1, ALT 16  ASSESSMENT:    1. Coronary artery disease involving coronary bypass graft of native heart without angina pectoris   2. Cough, unspecified type   3. Essential hypertension   4. Postoperative atrial fibrillation (HCC)   5. Hyperlipidemia LDL goal <70      PLAN:  In order of problems listed above:  CAD s/p CABG: She is not having any angina pectoris, I do not think.  It has always been extremely challenging to separate angina pectoris from multiple variable complaints of discomfort in the chest and abdomen.  Cardiac catheterization performed 18 months ago showed known native coronary artery disease, but all the bypass grafts were widely patent.  Echocardiogram performed a couple of months ago shows normal systolic and diastolic function. Cough/dyspnea at rest: Her echocardiogram does not support a cardiac etiology for her dyspnea.  She has had a cough for a long time and this appears to be worse.  The cough is productive, but the sputum is clear, Consider chronic infection, will check a chest xray (normal CXR in March 2023 and June 2023).  GERD another possibility.  Gave her Ladona Ridgel so she can at least sleep at night. Postop AFib: This only occurred in the immediate postop period and has not recurred.  Not on anticoagulation. HTN: Usually well-controlled, was 122/70 at the last office visit.  Came down substantially during the office visit, although the systolic blood pressure remain high.  Has often shown evidence of situational hypertension.  No changes made to her blood pressure medicine today. HLP: Untreated LDL cholesterol is 163.  Her LDL cholesterol has improved, although it has not reached ideal target of less than 70. Currently on maximum dose rosuvastatin, will be due for recheck labs early next year.  Medication Adjustments/Labs and Tests Ordered: Current medicines are reviewed at length  with the patient today.  Concerns regarding medicines are outlined above.  Medication changes, Labs and Tests ordered today are listed in the Patient Instructions below. Patient Instructions  Medication Instructions:  Your physician has recommended you make the following change in your medication:  START: Tessalon Pearls every 8 hours as needed for cough  OTC: Magnesium Oxide 400mg  1-2 times daily for muscle cramps and constipation  *If you need a refill on your cardiac medications before your next appointment, please call your pharmacy*   Lab Work: NONE If you have labs (blood work) drawn today and your tests are completely normal, you will receive your results only by: MyChart Message (if you have MyChart) OR A paper copy in the mail If you have any lab test that is abnormal or we need to change your treatment, we will call you to review the results.   Testing/Procedures: A chest x-Leah Pittman takes a picture of the organs and structures inside the chest, including the heart, lungs, and blood vessels. This test can show several things, including, whether the heart is enlarges; whether fluid is building up in the lungs; and whether pacemaker / defibrillator leads are still in place.    Follow-Up: At [facility], you and your health needs are our priority.  As part of our continuing mission to provide you with exceptional heart care, we have created designated Provider Care Teams.  These Care Teams include your primary Cardiologist (physician) and Advanced Practice Providers (APPs -  Physician Assistants and Nurse Practitioners) who all work together to provide you with the care you need, when you need it.  We recommend signing up for the patient portal called "MyChart".  Sign up information is provided on this After Visit Summary.  MyChart is used to connect with patients for Virtual Visits (Telemedicine).  Patients are able to view lab/test results, encounter notes, upcoming appointments,  etc.  Non-urgent messages can be sent to your provider as well.   To learn more about what you can do with MyChart, go to [facility].    Your next appointment:   1 year(s)  The format for your next appointment:   In Person  Provider:   [email or web address], MD      Signed, Thurmon Fair, MD  02/05/2022 3:44 PM    [facility] [address] [place], [place], [place]  [place] Phone: [phone]; Fax: [phone]

## 2022-02-07 ENCOUNTER — Ambulatory Visit
Admission: RE | Admit: 2022-02-07 | Discharge: 2022-02-07 | Disposition: A | Discharge status: Home / Self Care | Payer: Medicare Other | Source: Ambulatory Visit | Attending: Cardiovascular Disease | Admitting: Cardiovascular Disease

## 2022-02-07 ENCOUNTER — Ambulatory Visit: Payer: Medicare Other

## 2022-02-07 NOTE — Addendum Note (Signed)
Addended by: Daryll Brod on: 02/07/2022 01:27 PM   Modules accepted: Orders

## 2022-02-14 IMAGING — MR MR 3D RECON AT SCANNER
11 of 19 series · 21 of 48 positions shown · IV contrast (5 GAD)
Comparison: Multiple prior studies, most recent exam none [id number],
studies dating back to 6722 are available for comparison

CLINICAL DATA: Pancreatitis is suspected. Abnormal findings on
recent CT of the chest, abdomen and pelvis



[Series 2: cor ssfse nav · coronal · 6.0mm · 0.78mm/px · 2 of 29 slices shown]
[im 1/29]
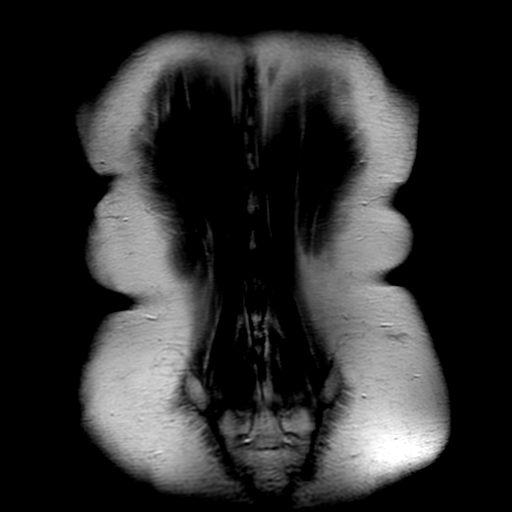
[im 29/29]
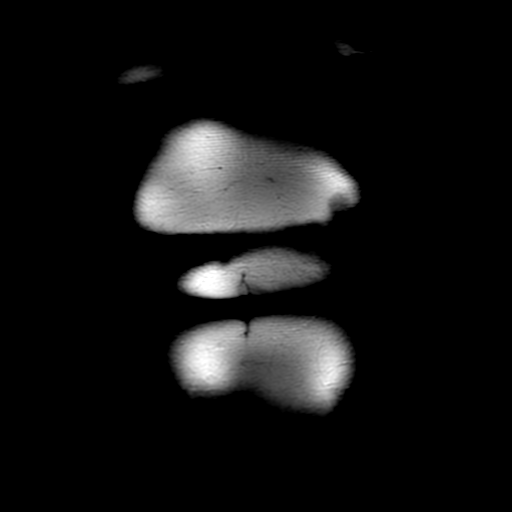

[Series 3: ax ssfse nav · axial · 6.0mm · 0.70mm/px · z∈[-0,+186]mm · 2 of 32 slices shown]
[im 1/32]
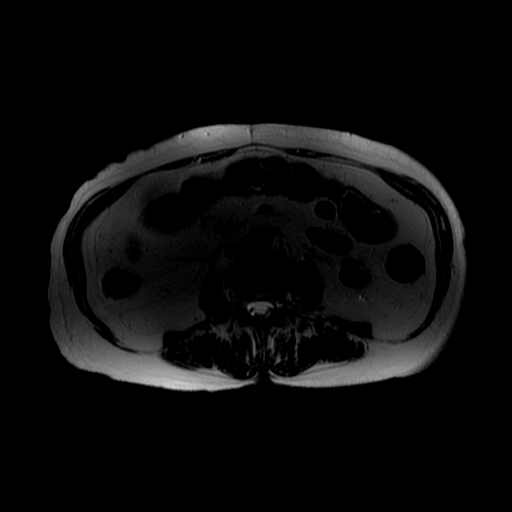
[im 32/32]
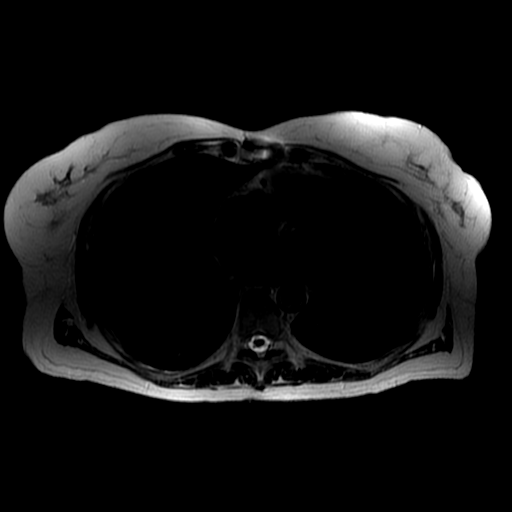

[Series 4: T2 fat-sat · axial · 6.0mm · 0.70mm/px · 1 of 30 slices shown]
[im 1/30]
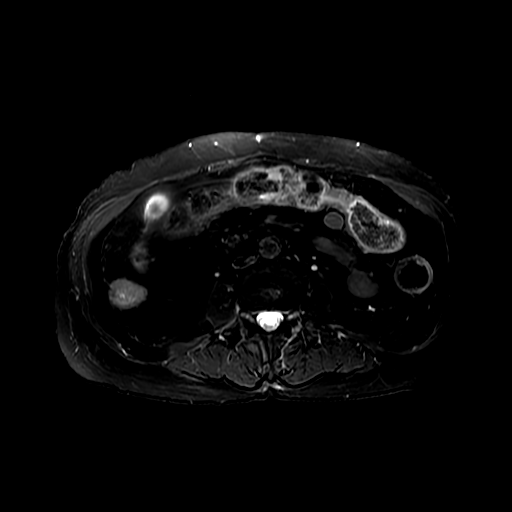

[Series 5: DWI b500 · axial · 8.0mm · 1.41mm/px · 1 of 41 slices shown]
[im 1/41]
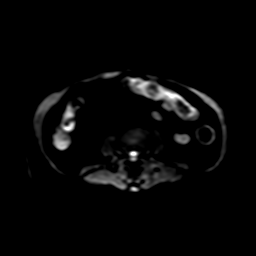

[Series 7: radial 2d thick · sagittal · 40.0mm · 0.86mm/px · 1 of 6 slices shown]
[im 1/6]
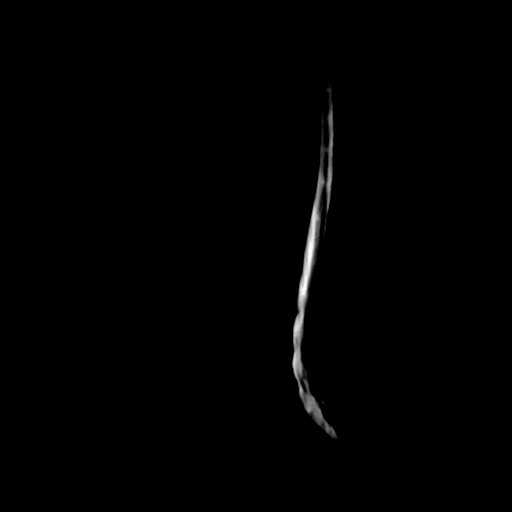

[Series 11: T1 dynamic · coronal · 3.4mm · 1.48mm/px · 3 of 112 slices shown (1 of 3)]
[im 1/112]
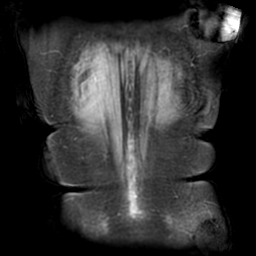
[im 56/112]
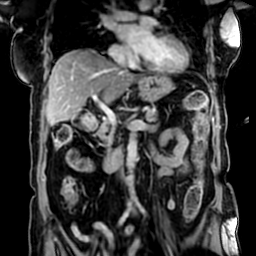
[im 112/112]
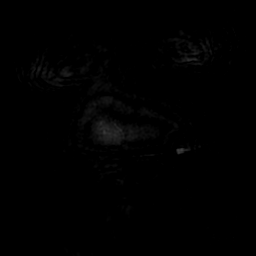

[Series 550: ADC · axial · 8.0mm · 1.41mm/px · 1 of 21 slices shown]
[im 1/21]
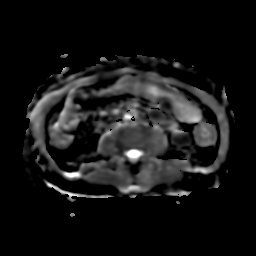

[Series 801: T1 dynamic · axial · 5.0mm · 0.70mm/px · z∈[-51,+186]mm · 3 of 96 slices shown (2 of 3)]
[im 1/96]
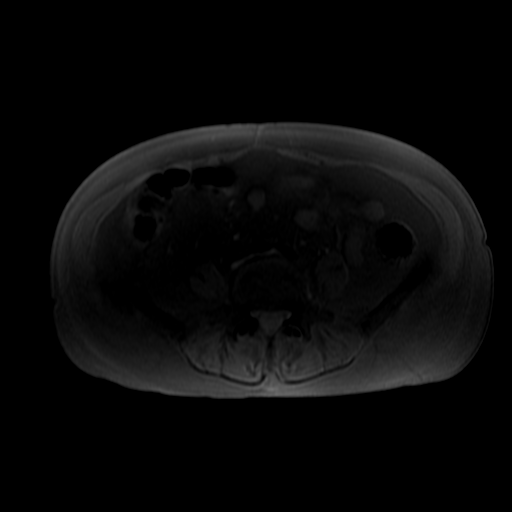
[im 48/96]
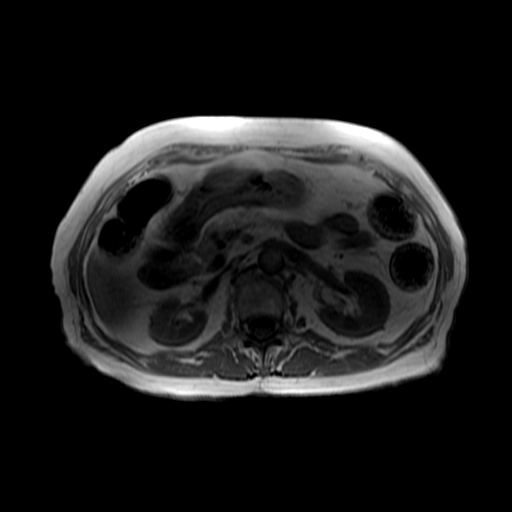
[im 96/96]
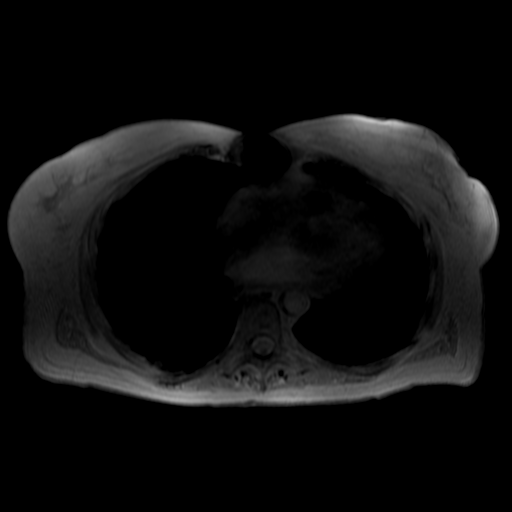

[Series 802: T1 dynamic · axial · 5.0mm · 0.70mm/px · z∈[-51,+186]mm · 3 of 96 slices shown (3 of 3)]
[im 1/96]
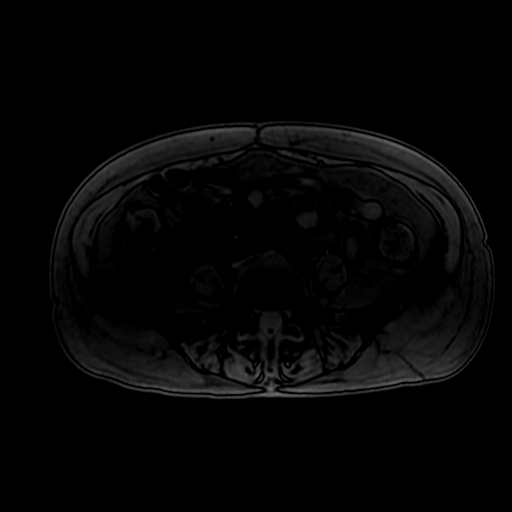
[im 48/96]
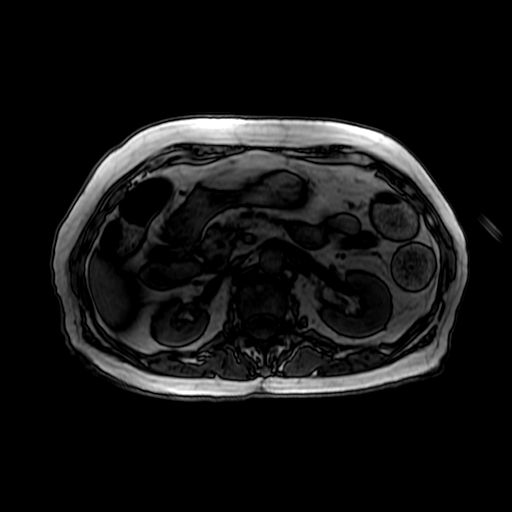
[im 96/96]
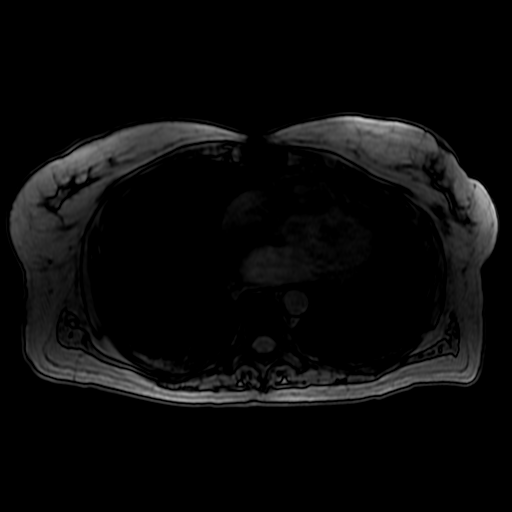

[Series 1000: T1 dynamic post-contrast · axial · non-contrast · 4.0mm · 0.78mm/px · z∈[-30,+184]mm · 3 of 108 slices shown (1 of 2)]
[im 1/108]
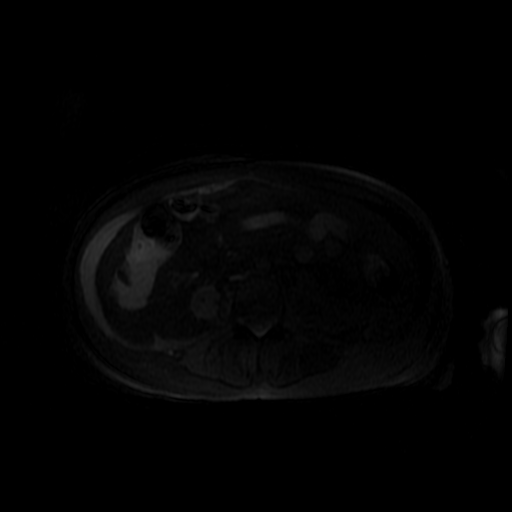
[im 54/108]
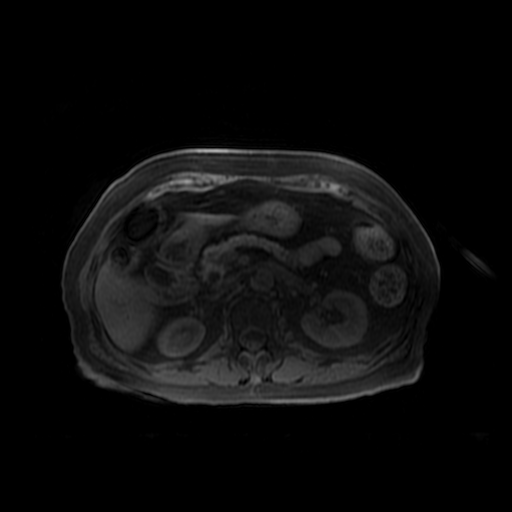
[im 108/108]
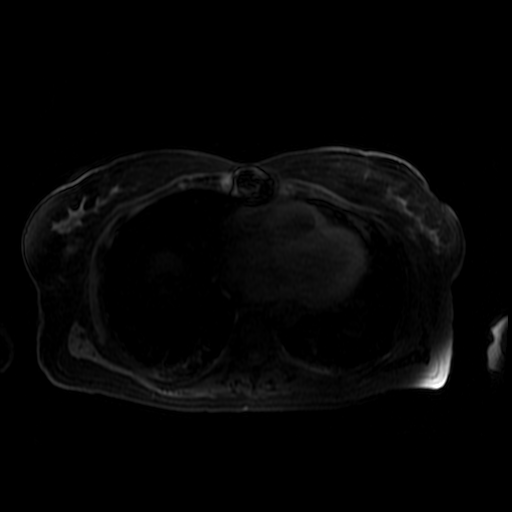

[Series 1001: T1 dynamic post-contrast · axial · non-contrast · 4.0mm · 0.78mm/px · 1 of 108 slices shown (2 of 2)]
[im 1/108]
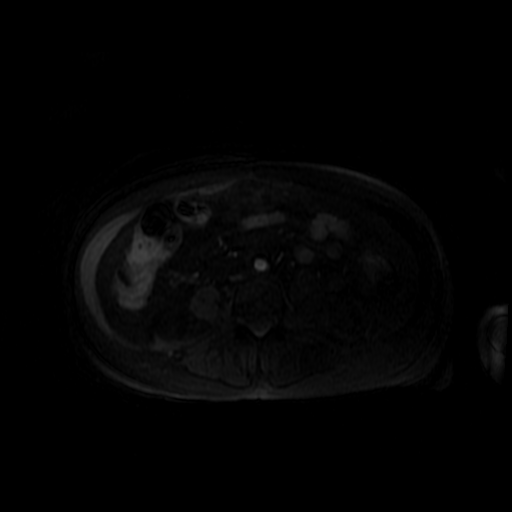

[21 of 48 positions shown; findings below may reference images not displayed]

FINDINGS: Lower chest: Incidental imaging of the lung bases without
consolidation or sign of pleural effusion. CT

Hepatobiliary: Biliary duct distension following cholecystectomy is
stable compared to studies from 9231. No focal, suspicious hepatic
lesion portal vein and hepatic veins are patent. Mild hepatic
steatosis.

Pancreas: Stable dilation of the main pancreatic duct particularly
in the head of the pancreas. No focal, suspicious pancreatic lesion
with stable pancreatic contour dating back to 9231. Normal intrinsic
T1 signal in pancreas which shows generalized, mild atrophy.

Spleen:  Within normal limits in size and appearance.

Adrenals/Urinary Tract: Adrenal glands are normal. Kidneys enhance
symmetrically without focal, suspicious renal lesion.

Stomach/Bowel: Gastrointestinal track showing question of mild
colonic thickening and colonic wall edema. Stool in the colon
limiting assessment. There is also subtle ahaustral change in the
colon, this is subtle on today's study. And may be related to
artifact in the setting of stool filled colonic loops

Vascular/Lymphatic: Abdominal aortic atherosclerotic changes without
aneurysmal dilation of the abdominal aorta

Other:  No ascites.

Musculoskeletal: Post sternotomy.
IMPRESSION: 1. Stable dilation of the main pancreatic duct particularly in the
head of the pancreas. No focal, suspicious pancreatic lesion with
stable contour dating back to 9231. No signs of pancreatic
inflammation to indicate pancreatitis. Given main duct distension
would consider a 12 month follow-up either with CT or MRI.
2. Biliary duct distension following cholecystectomy is stable
compared to studies from 9231.
3. Ahaustral appearance of the colon is suggested in some areas
along with mild colonic wall thickening. Correlate with any clinical
signs of colitis, this could be due to artifact.
4. Mild hepatic steatosis.

## 2022-02-17 ENCOUNTER — Telehealth: Payer: Self-pay | Admitting: Cardiovascular Disease

## 2022-02-17 DIAGNOSIS — R911 Solitary pulmonary nodule: Secondary | ICD-10-CM

## 2022-02-17 NOTE — Telephone Encounter (Signed)
Attempted to contact patient to discuss results:   Thurmon Fair, MD 02/14/2022  4:20 PM EST     No clear explanation for the cough, but the radiologist recommended a follow up CT for a 2 cm abnormality in the right upper lung. To me, it looks very much the same as in September 2022 and November 2022, but would be glad to order the CT since the cough remains an issue.     Patient did not answer, no voicemail, continued ringing. Will try again later.

## 2022-02-17 NOTE — Telephone Encounter (Signed)
Patient is following-up to get test results. 

## 2022-02-21 NOTE — Telephone Encounter (Signed)
Spoke with pt, aware of results. She still has a cough but the tessalon has helped it some. She reports a continued feeling of fullness or tightness in her chest and her voice is horse. She agrees to go ahead with the CT scan. Order placed.

## 2022-02-22 ENCOUNTER — Telehealth: Payer: Self-pay

## 2022-02-22 NOTE — Telephone Encounter (Signed)
Patient Is switching her pharmacy to [facility] [address] [place].

## 2022-02-24 ENCOUNTER — Other Ambulatory Visit: Payer: Self-pay

## 2022-02-24 DIAGNOSIS — R059 Cough, unspecified: Secondary | ICD-10-CM

## 2022-02-24 DIAGNOSIS — Z01812 Encounter for preprocedural laboratory examination: Secondary | ICD-10-CM

## 2022-03-13 ENCOUNTER — Ambulatory Visit
Admission: RE | Admit: 2022-03-13 | Discharge: 2022-03-13 | Disposition: A | Discharge status: Home / Self Care | Payer: Medicare Other | Source: Ambulatory Visit | Attending: Cardiovascular Disease | Admitting: Cardiovascular Disease

## 2022-03-13 DIAGNOSIS — R911 Solitary pulmonary nodule: Secondary | ICD-10-CM

## 2022-03-14 NOTE — Progress Notes (Signed)
Thanks, Dr. Sallyanne Kuster!  Can we get her scheduled with me next available - looks as if I had requested a 3 month f/u in 2022 and has been lost to f/u. Thanks!

## 2022-03-20 ENCOUNTER — Other Ambulatory Visit: Payer: Self-pay | Admitting: Family Medicine

## 2022-03-22 ENCOUNTER — Other Ambulatory Visit: Payer: Self-pay

## 2022-03-22 ENCOUNTER — Other Ambulatory Visit: Payer: Self-pay | Admitting: Family Medicine

## 2022-03-22 MED ORDER — ROSUVASTATIN CALCIUM 40 MG PO TABS: 40.0000 mg | ORAL_TABLET | Freq: Every day | ORAL | 0 refills | Status: DC

## 2022-03-22 MED ORDER — LEVOTHYROXINE SODIUM 100 MCG PO TABS: 100.0000 ug | ORAL_TABLET | Freq: Every day | ORAL | 1 refills | Status: DC

## 2022-03-30 ENCOUNTER — Ambulatory Visit (INDEPENDENT_AMBULATORY_CARE_PROVIDER_SITE_OTHER): Payer: Medicare Other | Admitting: Pulmonary Disease

## 2022-03-30 ENCOUNTER — Encounter: Payer: Self-pay | Admitting: Pulmonary Disease

## 2022-03-30 VITALS — BP 126/70 | HR 66 | Wt 113.6 lb

## 2022-03-30 DIAGNOSIS — K2950 Unspecified chronic gastritis without bleeding: Secondary | ICD-10-CM | POA: Diagnosis not present

## 2022-03-30 DIAGNOSIS — R0609 Other forms of dyspnea: Secondary | ICD-10-CM

## 2022-03-30 NOTE — Patient Instructions (Signed)
Nice to see you again  Use the flutter valve 3-4 times a day every day to keep mucous moving  Coughing is ok because we have ot keep mucous moving  This is due to BRONCHIECTASIS  I sent a referral to GI for evaluation of stomach and esophagus pain or discomfort  Return to clinic in 3 months or sooner as needed

## 2022-04-03 NOTE — Progress Notes (Signed)
$@[email or web address]$  ID: Leah Pittman, female    DOB: 1930-07-09, 89 y.o.   MRN: [id number]  Chief Complaint  Patient presents with   Follow-up    Follow up for Leah Pittman. Pt does take albuterol as needed. Pt is here to go over CT scan results she had CT scan on 03/13/22. Pt is concerned about her albuterol causing blindness, she states she read or heard about this from another doctor.     Referring provider: Rochel Brome, MD  HPI:   89 y.o. woman whom we are seeing in follow-up for evaluation of dyspnea on exertion, cough with bronchiectasis on CT scan.  Lost to follow up. Last seen ~20 months ago. Seems she was doing fine so did not follow through with f/u visit in 2022. Contacted by cardiologist after repeat CT scan earlier this year. On my review and interpretation this reveals mild scattered bronchiectasis and tree in bud opacities consistent with NTM disease. Seems DOE in general is ok. She has a cough that comes and goes in severity. When severe, cough is deep and frequent with thick phlegm production. During these episodes she has DOE that is worse than baseline. After a particularly satisfying coughing fit, mucous is expectorated and her dyspnea improves significantly. Symptoms not really improved with rescue inhaler use.   HPI at initial visit: In review, she was admitted with increased cough, dyspnea, mild hypoxemia.  Prior CT 04/2019 revealed bilateral basilar mild bronchiectasis primarily on my interpretation.  Scattered bronchiectasis elsewhere.  She endorses ongoing at that time and currently cough with eating or drinking.  Feels like she gets choked.  She endorses seasonal allergies. She was recently admitted. Treated with abx for pneumonia. High suspicion for aspiration pneumonia. No frank aspiration prior SLP eval.  Still SOB today. Pattern of breathing changes and is variable during visit. Says nebulized ICS/LABA was not helpful. Denies significant nasal congestion. Notes cough much better  since hospitalization.    Questionaires / Pulmonary Flowsheets:   ACT:      No data to display          MMRC:     No data to display          Epworth:      No data to display          Tests:   FENO:  No results found for: "NITRICOXIDE"  PFT:    Latest Ref Rng & Units 09/13/2020    9:15 AM  PFT Results  FVC-Pre L 1.75  P  FVC-Predicted Pre % 76  P  FVC-Post L 1.94  P  FVC-Predicted Post % 84  P  Pre FEV1/FVC % % 62  P  Post FEV1/FCV % % 62  P  FEV1-Pre L 1.08  P  FEV1-Predicted Pre % 64  P  FEV1-Post L 1.20  P  DLCO uncorrected ml/min/mmHg 10.88  P  DLCO UNC% % 59  P  DLVA Predicted % 81  P  TLC L 4.94  P  TLC % Predicted % 97  P  RV % Predicted % 122  P    P Preliminary result  Personally reviewed and interpreted as no fixed obstruction, air trapping on lung volumes, DLCO is reduced  WALK:      No data to display          Imaging: Personally reviewed and as per EMR discussion this note CT Chest Wo Contrast  Result Date: 03/14/2022 CLINICAL DATA:  Dyspnea and cough. Abnormal chest radiograph  with possible right pulmonary nodule. Asthma. EXAM: CT CHEST WITHOUT CONTRAST TECHNIQUE: Multidetector CT imaging of the chest was performed following the standard protocol without IV contrast. RADIATION DOSE REDUCTION: This exam was performed according to the departmental dose-optimization program which includes automated exposure control, adjustment of the mA and/or kV according to patient size and/or use of iterative reconstruction technique. COMPARISON:  05/19/2020 chest CT.  02/07/2022 chest radiograph. FINDINGS: Cardiovascular: Normal heart size. No significant pericardial effusion/thickening. Left main and 3 vessel coronary atherosclerosis status post CABG. Atherosclerotic nonaneurysmal thoracic aorta. Normal caliber pulmonary arteries. Mediastinum/Nodes: No significant thyroid nodules. Unremarkable esophagus. No axillary adenopathy. No pathologically  enlarged mediastinal nodes. Coarsely calcified right hilar nodes are unchanged and compatible with prior granulomatous disease. No pathologically enlarged discrete noncalcified hilar nodes on these noncontrast images. Lungs/Pleura: No pneumothorax. No pleural effusion. Widespread mild cylindrical and varicoid bronchiectasis throughout both lungs involving all lung lobes, most prominent in right middle and right lower lobes with associated patchy mucoid impaction and scattered minimal tree-in-bud and ground-glass opacities. The minimal ground-glass and tree-in-bud opacities in the peripheral right upper lobe are worsened. Findings are otherwise unchanged. Scattered right lower lobe pulmonary nodules measuring up to 0.6 cm (series 8/image 106) are stable. No acute consolidative airspace disease, lung masses or new significant pulmonary nodules. Scattered subcentimeter bilateral calcified pulmonary granulomas. Upper abdomen: Small hiatal hernia.  Cholecystectomy. Musculoskeletal: No aggressive appearing focal osseous lesions. Intact sternotomy wires. Mild thoracic spondylosis. IMPRESSION: 1. Chronic widespread mild cylindrical and varicoid bronchiectasis, most prominent in the right middle and right lower lobes, with associated patchy mucoid impaction. Inflammatory minimal ground-glass and tree-in-bud opacities in the peripheral right upper lobe are worsened. Findings are otherwise unchanged. Findings may represent chronic atypical mycobacterial infection (MAI). 2. No new significant pulmonary nodules. Chronic right lower lobe pulmonary nodules are stable and considered benign. 3. Small hiatal hernia. 4.  Aortic Atherosclerosis (ICD10-I70.0). Electronically Signed   By: Ilona Sorrel M.D.   On: 03/14/2022 09:49    Lab Results: Personally reviewed, eos 300 CBC    Component Value Date/Time   WBC 9.1 12/08/2021 1555   WBC 8.0 01/05/2021 1238   RBC 3.92 12/08/2021 1555   RBC 4.28 01/05/2021 1238   HGB 12.1  12/08/2021 1555   HCT 36.5 12/08/2021 1555   PLT 231 12/08/2021 1555   MCV 93 12/08/2021 1555   MCH 30.9 12/08/2021 1555   MCH 31.1 01/05/2021 1238   MCHC 33.2 12/08/2021 1555   MCHC 33.0 01/05/2021 1238   RDW 14.2 12/08/2021 1555   LYMPHSABS 2.4 12/08/2021 1555   MONOABS 1.0 11/02/2020 1832   EOSABS 0.4 12/08/2021 1555   BASOSABS 0.1 12/08/2021 1555    BMET    Component Value Date/Time   NA 142 12/08/2021 1555   K 5.1 12/08/2021 1555   CL 106 12/08/2021 1555   CO2 24 12/08/2021 1555   GLUCOSE 93 12/08/2021 1555   GLUCOSE 113 (H) 01/05/2021 1238   BUN 17 12/08/2021 1555   CREATININE 0.97 12/08/2021 1555   CALCIUM 10.5 (H) 12/08/2021 1555   GFRNONAA >60 01/05/2021 1238   GFRAA 64 12/22/2019 1140    BNP    Component Value Date/Time   BNP 177.4 (H) 04/21/2021 1503   BNP 260.7 (H) 07/14/2020 1246    ProBNP No results found for: "PROBNP"  Specialty Problems       Pulmonary Problems   Chronic obstructive pulmonary disease with acute lower respiratory infection (HCC)   SOB (shortness of breath)  Dyspnea on exertion    Allergies  Allergen Reactions   Benadryl [Diphenhydramine] Swelling and Other (See Comments)    Tongue swells and hallucinates- could not talk   Lipase Concentrate-Hp [Digestive Enzymes] Rash   Zenpep [Pancrelipase (Lip-Prot-Amyl)] Rash   Codeine Nausea And Vomiting and Hypertension   Meperidine Nausea Only, Swelling and Other (See Comments)    Tongue swells and "I feel like death"   Other Other (See Comments)    SKIN IS VERY SENSITIVE AND IT TEARS EASILY!!   Prednisone Hypertension   Promethazine Other (See Comments)    Hypotension    Propranolol Nausea And Vomiting   Shellfish Allergy Nausea And Vomiting   Tape Other (See Comments)    Tears the skin   Tazarotene Other (See Comments)    Reaction not recalled- "Tazarotene, commonly marketed as Tazorac, Avage, and Zorac, is member of the acetylenic class of retinoids."    Iodinated  Contrast Media Nausea Only and Rash    Immunization History  Administered Date(s) Administered   Tdap 09/25/2014    Past Medical History:  Diagnosis Date   Anxiety    Arthritis    Chest pain 2011   CARDIOLITE - no significant symptoms, EKG changes, or arrhythmias   Congenital prolapse of bladder mucosa    Diverticulosis    Dyspnea 02/16/2012   2D ECHO - EF 55-60%, normal   Esophageal dysphagia    Fatigue    GERD (gastroesophageal reflux disease)    Glaucoma    Headache(784.0)    Heart attack ([place])    Hiatal hernia    High blood pressure 02/16/2012   RENAL DOPPLER - normal   Hyperlipidemia    Hypothyroidism    Labile hypertension    Lightheadedness    Migraine headache    Multiple allergies    Myalgia    NSTEMI (non-ST elevated myocardial infarction) ([place]) 07/14/2020   OSA (obstructive sleep apnea)    Pain, lower leg    Calf   Problem of menstruation    Proteinuria    Reflux    SOB (shortness of breath)    Thyroid disease    Urinary problem    Valvular heart disease    Vitamin B12 deficiency    Vitamin D deficiency     Tobacco History: Social History   Tobacco Use  Smoking Status Never  Smokeless Tobacco Never   Counseling given: Not Answered   Continue to not smoke  Outpatient Encounter Medications as of 03/30/2022  Medication Sig   albuterol (VENTOLIN HFA) 108 (90 Base) MCG/ACT inhaler Inhale 2 puffs into the lungs every 4 (four) hours as needed for wheezing or shortness of breath.   aspirin 81 MG tablet Take 81 mg by mouth daily.   benzonatate (TESSALON) 100 MG capsule Take 1 capsule (100 mg total) by mouth every 8 (eight) hours as needed for cough.   Budeson-Glycopyrrol-Formoterol (BREZTRI AEROSPHERE) 160-9-4.8 MCG/ACT AERO Inhale 2 puffs into the lungs 2 (two) times daily as needed (for flares).   Cholecalciferol (VITAMIN D3 SUPER STRENGTH) 50 MCG (2000 UT) CAPS Take 2,000 Units by mouth daily after lunch.   dorzolamide-timolol (COSOPT) 22.3-6.8  MG/ML ophthalmic solution Place 1 drop into the left eye 2 (two) times daily.    latanoprost (XALATAN) 0.005 % ophthalmic solution Place 1 drop into the left eye at bedtime.   levothyroxine (SYNTHROID) 100 MCG tablet Take 1 tablet (100 mcg total) by mouth daily before breakfast.   losartan (COZAAR) 25 MG tablet TAKE 1 TABLET (  25 MG TOTAL) BY MOUTH DAILY.   pantoprazole (PROTONIX) 40 MG tablet TAKE 1 TABLET BY MOUTH TWICE A DAY   rosuvastatin (CRESTOR) 40 MG tablet Take 1 tablet (40 mg total) by mouth daily.   senna-docusate (SENOKOT-S) 8.6-50 MG tablet Take 1 tablet by mouth daily.   simvastatin (ZOCOR) 40 MG tablet Take 40 mg by mouth at bedtime.   XELPROS 0.005 % EMUL Place 1 drop into the left eye at bedtime.   No facility-administered encounter medications on file as of 03/30/2022.     Review of Systems   No chest pain with exertion.  No orthopnea or PND.  No lower extremity swelling.  Comprehensive review of systems otherwise negative. Physical Exam  BP 126/70 (BP Location: Left Arm, Patient Position: Sitting, Cuff Size: Normal)   Pulse 66   Wt 113 lb 9.6 oz (51.5 kg)   SpO2 99%   BMI 19.50 kg/m   Wt Readings from Last 5 Encounters:  03/30/22 113 lb 9.6 oz (51.5 kg)  02/03/22 115 lb 6.4 oz (52.3 kg)  12/08/21 119 lb (54 kg)  11/08/21 116 lb 12.8 oz (53 kg)  10/07/21 116 lb 6.4 oz (52.8 kg)    BMI Readings from Last 5 Encounters:  03/30/22 19.50 kg/m  02/03/22 19.81 kg/m  12/08/21 20.43 kg/m  11/08/21 20.05 kg/m  10/07/21 19.98 kg/m     Physical Exam General: Thin, pale, in NAD Eyes: EOMI, no icterus Neck: Supple, no JVP Cardiovascular: Regular rate and rhythm, no murmur Pulmonary: Clear to auscultation bilaterally, no wheezes or crackles, normal work of breathing Abdomen: Nondistended, bowel sounds present MSK: No synovitis, joint effusion Neuro: No weakness, normal gait Psych: Normal mood, full affect   Assessment & Plan:   DOE: Most likely related  to deconditioning. Possible contribution from bronchiectasis, PFT 08/2020 demonstrates air trapping but inhalers historically not shown improvement with use.  Possible cardiac contribution given elevated BNP in the past. Improves with mucous expectoration - see below.  Mild Bronchiectasis: on imaging. High suspicion for aspiration based on cough with eating/drinking, location of bronchiectasis in LL. DOE improves with mucous expectoration. Add flutter valve today for airway clearance. Consider re-trial of bronchodilators in future.  Gastritis, GERD with ongoing cough and suspicion for aspiration of thin liquids: she describes symptoms of gastritis and GERD despite BID PPI. Referral to GI.   Return in about 3 months (around 06/28/2022).   Lanier Clam, MD 04/03/2022   This appointment required 43 minutes of patient care (this includes precharting, chart review, review of results, face-to-face care, etc.).

## 2022-04-12 ENCOUNTER — Telehealth: Payer: Self-pay

## 2022-04-12 NOTE — Progress Notes (Signed)
Care Management & Coordination Services Pharmacy Team  Reason for Encounter: General adherence update   Contacted patient for general health update and medication adherence call.  {US HC Outreach:28874}   What concerns do you have about your medications?  The patient {denies/reports:25180} side effects with their medications.   How often do you forget or accidentally miss a dose? {missed doses:25554}  Do you use a pillbox? {yes/no:20286}  Are you having any problems getting your medications from your pharmacy? {yes/no:20286}  Has the cost of your medications been a concern? {yes/no:20286} If yes, what medication and is patient assistance available or has it been applied for?  Since last visit with PharmD, {no/thefollowing:25210} interventions have been made.   The patient {has/has not:25209} had an ED visit since last contact.   The patient {denies/reports:25180} problems with their health.   Patient {denies/reports:25180} concerns or questions for ***, PharmD at this time.   Counseled patient on: {GENERALCOUNSELING:28686}   Chart Updates:  Recent office visits:  12/08/21 Rochel Brome MD. Seen for routine visit. Referral to Gastroenterology. No med changes.   Recent consult visits:  03/30/22 (Pulmonary Disease) Larey Days MD. Seen for Chronic Gastritis. No med changes.  03/15/22 [facility]) Bond, Tracie Harrier MD. Seen for Eye pain. No med changes.   02/03/22 (Cardiology) Croitour, Mihai MD. Seen for CAD. Started on Benzonatate '100mg'$ .  01/21/22 [facility] Bond, Tracie Harrier MD. Seen for follow up. No med changes. Latan x1 OS, Dorzolamide-Timolol x2 OS.  11/08/21 (Cardiology) Ledora Bottcher PA. Seen for Chest Pain. Referral to Pulmonology. No med changes.   Hospital visits:  None in previous 6 months  Medications: Outpatient Encounter Medications as of 04/12/2022  Medication Sig   albuterol (VENTOLIN HFA) 108 (90 Base) MCG/ACT inhaler Inhale 2 puffs  into the lungs every 4 (four) hours as needed for wheezing or shortness of breath.   aspirin 81 MG tablet Take 81 mg by mouth daily.   benzonatate (TESSALON) 100 MG capsule Take 1 capsule (100 mg total) by mouth every 8 (eight) hours as needed for cough.   Budeson-Glycopyrrol-Formoterol (BREZTRI AEROSPHERE) 160-9-4.8 MCG/ACT AERO Inhale 2 puffs into the lungs 2 (two) times daily as needed (for flares).   Cholecalciferol (VITAMIN D3 SUPER STRENGTH) 50 MCG (2000 UT) CAPS Take 2,000 Units by mouth daily after lunch.   dorzolamide-timolol (COSOPT) 22.3-6.8 MG/ML ophthalmic solution Place 1 drop into the left eye 2 (two) times daily.    latanoprost (XALATAN) 0.005 % ophthalmic solution Place 1 drop into the left eye at bedtime.   levothyroxine (SYNTHROID) 100 MCG tablet Take 1 tablet (100 mcg total) by mouth daily before breakfast.   losartan (COZAAR) 25 MG tablet TAKE 1 TABLET (25 MG TOTAL) BY MOUTH DAILY.   pantoprazole (PROTONIX) 40 MG tablet TAKE 1 TABLET BY MOUTH TWICE A DAY   rosuvastatin (CRESTOR) 40 MG tablet Take 1 tablet (40 mg total) by mouth daily.   senna-docusate (SENOKOT-S) 8.6-50 MG tablet Take 1 tablet by mouth daily.   simvastatin (ZOCOR) 40 MG tablet Take 40 mg by mouth at bedtime.   XELPROS 0.005 % EMUL Place 1 drop into the left eye at bedtime.   No facility-administered encounter medications on file as of 04/12/2022.    Recent vitals BP Readings from Last 3 Encounters:  03/30/22 126/70  02/03/22 (!) 194/64  12/08/21 122/70   Pulse Readings from Last 3 Encounters:  03/30/22 66  02/03/22 64  12/08/21 64   Wt Readings from Last 3 Encounters:  03/30/22  113 lb 9.6 oz (51.5 kg)  02/03/22 115 lb 6.4 oz (52.3 kg)  12/08/21 119 lb (54 kg)   BMI Readings from Last 3 Encounters:  03/30/22 19.50 kg/m  02/03/22 19.81 kg/m  12/08/21 20.43 kg/m    Recent lab results    Component Value Date/Time   NA 142 12/08/2021 1555   K 5.1 12/08/2021 1555   CL 106 12/08/2021 1555    CO2 24 12/08/2021 1555   GLUCOSE 93 12/08/2021 1555   GLUCOSE 113 (H) 01/05/2021 1238   BUN 17 12/08/2021 1555   CREATININE 0.97 12/08/2021 1555   CALCIUM 10.5 (H) 12/08/2021 1555    Lab Results  Component Value Date   CREATININE 0.97 12/08/2021   GFR 60.94 01/27/2020   EGFR 56 (L) 12/08/2021   GFRNONAA >60 01/05/2021   GFRAA 64 12/22/2019   Lab Results  Component Value Date/Time   HGBA1C 5.9 (H) 12/08/2021 03:55 PM   HGBA1C 5.8 (H) 07/10/2018 04:21 AM    Lab Results  Component Value Date   CHOL 180 12/08/2021   HDL 76 12/08/2021   LDLCALC 90 12/08/2021   TRIG 75 12/08/2021   CHOLHDL 2.4 12/08/2021    Care Gaps: Annual wellness visit in last year? Yes  Star Rating Drugs:  Medication:  Last Fill: Day Supply Losartan   02/18/22-11/25/21 90ds Rosuvastatin  Simvastatin   Elray Mcgregor, [place] Pharmacist Assistant  [phone]

## 2022-04-12 NOTE — Telephone Encounter (Signed)
PATIENT WALKED IN TO THE OFFICE TODAY CONCERNED BECAUSE SHE HAD WENT TO THE DENTIST AND THEY STATED THAT HER BLOOD PRESSURE WAS 196/76 WHEN THEY TOOK IT BEFORE HER THEY WERE TO START FIXING HER FILLINGS IN HER TEETH SO THEY WOULD NOT DO IT. SHE WAS UPSET AND CONCERNED ABOUT HER BLOOD PRESSURE, WE HAVE NO APPOINTMENTS AND IN ORDER FOR HER TO GET HER TEETH DONE SHE NEEDS A PAPER SIGNED FROM [place] STATING THAT HER BLOOD PRESSURE IS BETTER AND OK TO DO PROCEDURE. PATIENT IS SCHEDULED TO SEE DR. [name] ON NEXT WEDNESDAY ON 04/19/22 AND PER DR. [name] PATIENT IS TO INCREASE HER BP MEDICATION LOSARTAN FROM 25 MG DAILY TO 50 MG DAILY WHICH WOULD BE 2 OF THE 25MG TABLETS. PATIENT WOULD NEED TO CALL us IF HER BLOOD PRESSURE STAYS ABOVE 160/100 AND STILL DOES NOT COME DOWN AFTER 3 DAYS, PATIENT WAS ALSO INSTRUCTED TO CALL us IF HER BLOOD PRESSURE IS TOO LOW AS WELL OR HAS ANY CONCERNS. SHE WILL ALSO KEEP A BP LOG TO BRING IN WITH HER NEXT WEDNESDAY TO HER APPOINTMENT. PATIENT AND HER HUSBAND UNDERSTOOD VERBALLY AND WILL CALL us IF SHE HAS ANY CONCERNS.

## 2022-04-13 ENCOUNTER — Telehealth: Payer: Self-pay | Admitting: Cardiovascular Disease

## 2022-04-13 NOTE — Telephone Encounter (Signed)
Will address at clinic visit.  Appreciate thorough workup by RN.   Loel Dubonnet, NP

## 2022-04-13 NOTE — Telephone Encounter (Signed)
Received msg from Bryce about patient's bp still being elevated. Called Cardio, Spoke with Sylvia  Mond 26th at [phone], be here at 0900. [place] office. See Oris Drone transferred me to [facility], a Optometrist, who will call and evaluate patient later today.  Also sent direct msg to PCP informing her

## 2022-04-13 NOTE — Telephone Encounter (Signed)
Spoke to Maury at Dr. Alyse Low office (PCP)-reports patient went to the dentist yesterday and they refused to see her due to elevated BP 187/70.   PCP increased her losartan yesterday.   They called patient this morning and BP was 123456 systolic.    Patient reports feeling pulse in head.     Requested cardiology call patient to discuss.   Appt Monday with NP.    Per last OV 01/2023 with Dr. Sallyanne Kuster: HTN: Usually well-controlled, was 122/70 at the last office visit.  Came down substantially during the office visit, although the systolic blood pressure remain high.  Has often shown evidence of situational hypertension.  No changes made to her blood pressure medicine today.  Reports issues with BPx 2 weeks-BP up and down, gets extremely low or high.     Takes BP pill every day as prescribed.  Does not have readings written down before yesterday.   BP readings today:  115 AM 162/72 69 830 AM 160/62 68 took meds (25 mg losartan) This AM: 11AM  201/69 69-2-3 hours after medication (losartan 50 yesterday).  Reports concerns with increasing meds due to BP dropping low in the mornings.  111/41 earlier this week 108/38.    +HA-knows when her BP is elevated, doesn't feel well.   Intermittent chest pain-not currently.   SOB all the time.   Has glaucoma-cannot see well at baseline.   216/75 74 currently.    Discussed with DOD, recommend ER evaluation.   Patient aware and verbalized understanding.   She will keep appt Monday for follow up as well.

## 2022-04-17 ENCOUNTER — Encounter (HOSPITAL_BASED_OUTPATIENT_CLINIC_OR_DEPARTMENT_OTHER): Payer: Self-pay | Admitting: Family

## 2022-04-17 ENCOUNTER — Ambulatory Visit (INDEPENDENT_AMBULATORY_CARE_PROVIDER_SITE_OTHER): Payer: Medicare Other | Admitting: Family

## 2022-04-17 VITALS — BP 144/88 | HR 68 | Ht 64.0 in | Wt 113.0 lb

## 2022-04-17 DIAGNOSIS — Z01818 Encounter for other preprocedural examination: Secondary | ICD-10-CM

## 2022-04-17 DIAGNOSIS — E785 Hyperlipidemia, unspecified: Secondary | ICD-10-CM

## 2022-04-17 DIAGNOSIS — I25118 Atherosclerotic heart disease of native coronary artery with other forms of angina pectoris: Secondary | ICD-10-CM

## 2022-04-17 DIAGNOSIS — I10 Essential (primary) hypertension: Secondary | ICD-10-CM | POA: Diagnosis not present

## 2022-04-17 DIAGNOSIS — Z951 Presence of aortocoronary bypass graft: Secondary | ICD-10-CM

## 2022-04-17 MED ORDER — OLMESARTAN MEDOXOMIL 20 MG PO TABS: 20.0000 mg | ORAL_TABLET | Freq: Every day | ORAL | 1 refills | Status: DC

## 2022-04-17 NOTE — Progress Notes (Signed)
Office Visit    Patient Name: Leah Pittman Date of Encounter: 04/17/2022  PCP:  Rochel Brome, MD   [place]  Cardiologist:  Sanda Klein, MD Advanced Practice Provider:  No care team member to display Electrophysiologist:  None      Chief Complaint    Leah Pittman is a 89 y.o. female presents today for labile blood pressure.    Past Medical History    Past Medical History:  Diagnosis Date   Anxiety    Arthritis    Chest pain 2011   CARDIOLITE - no significant symptoms, EKG changes, or arrhythmias   Congenital prolapse of bladder mucosa    Diverticulosis    Dyspnea 02/16/2012   2D ECHO - EF 55-60%, normal   Esophageal dysphagia    Fatigue    GERD (gastroesophageal reflux disease)    Glaucoma    Headache(784.0)    Heart attack ([place])    Hiatal hernia    High blood pressure 02/16/2012   RENAL DOPPLER - normal   Hyperlipidemia    Hypothyroidism    Labile hypertension    Lightheadedness    Migraine headache    Multiple allergies    Myalgia    NSTEMI (non-ST elevated myocardial infarction) ([place]) 07/14/2020   OSA (obstructive sleep apnea)    Pain, lower leg    Calf   Problem of menstruation    Proteinuria    Reflux    SOB (shortness of breath)    Thyroid disease    Urinary problem    Valvular heart disease    Vitamin B12 deficiency    Vitamin D deficiency    Past Surgical History:  Procedure Laterality Date   ABDOMINAL HYSTERECTOMY  1976   CHOLECYSTECTOMY  2000   COLONOSCOPY  07/27/2014   Moderate predominantly sigmoid divertuculosis. Otherwise normal colonoscopy   CORONARY ARTERY BYPASS GRAFT N/A 07/10/2018   Procedure: CORONARY ARTERY BYPASS GRAFTING (CABG), FREE LIMA;  Surgeon: Gaye Pollack, MD;  Location: [facility];  Service: Open Heart Surgery;  Laterality: N/A;   CYST REMOVAL NECK     CYSTECTOMY  1974   Intestine   CYSTECTOMY  1996   Brain stem   ESOPHAGOGASTRODUODENOSCOPY  12/07/2016   Schatzki's ring status  post esophageal dilatation. Small hiatal hernia. Mild gastritis   EYE SURGERY  2003   EYE SURGERY  07/2016   LEFT HEART CATH AND CORONARY ANGIOGRAPHY N/A 07/08/2018   Procedure: LEFT HEART CATH AND CORONARY ANGIOGRAPHY;  Surgeon: Lorretta Harp, MD;  Location: [facility];  Service: Cardiovascular;  Laterality: N/A;   LEFT HEART CATH AND CORONARY ANGIOGRAPHY N/A 07/25/2019   Procedure: LEFT HEART CATH AND CORONARY ANGIOGRAPHY;  Surgeon: Burnell Blanks, MD;  Location: [facility];  Service: Cardiovascular;  Laterality: N/A;   MOUTH SURGERY  2013   RADIOLOGY WITH ANESTHESIA N/A 11/05/2019   Procedure: MRI WITH ANESTHESIA;  Surgeon: Radiologist, Medication, MD;  Location: [facility];  Service: Radiology;  Laterality: N/A;   TEE WITHOUT CARDIOVERSION N/A 07/10/2018   Procedure: TRANSESOPHAGEAL ECHOCARDIOGRAM (TEE);  Surgeon: Gaye Pollack, MD;  Location: [facility];  Service: Open Heart Surgery;  Laterality: N/A;   TONSILLECTOMY     age 89    Allergies  Allergies  Allergen Reactions   Benadryl [Diphenhydramine] Swelling and Other (See Comments)    Tongue swells and hallucinates- could not talk   Lipase Concentrate-Hp [Digestive Enzymes] Rash   Zenpep [Pancrelipase (Lip-Prot-Amyl)] Rash  Codeine Nausea And Vomiting and Hypertension   Meperidine Nausea Only, Swelling and Other (See Comments)    Tongue swells and "I feel like death"   Other Other (See Comments)    SKIN IS VERY SENSITIVE AND IT TEARS EASILY!!   Prednisone Hypertension   Promethazine Other (See Comments)    Hypotension    Propranolol Nausea And Vomiting   Shellfish Allergy Nausea And Vomiting   Tape Other (See Comments)    Tears the skin   Tazarotene Other (See Comments)    Reaction not recalled- "Tazarotene, commonly marketed as Tazorac, Avage, and Zorac, is member of the acetylenic class of retinoids."    Iodinated Contrast Media Nausea Only and Rash    History of Present Illness    Leah Pittman is a 89 y.o. female with a hx of CAD, HLD, labile HTN, multivaessel CAD s/p CABG (07/10/18 LIMA-LAD, SVG-diagonal, sequential SVT to 0000000 complicated by transient postoperative atrial fibrillation last seen 02/03/22 by Dr. Sallyanne Kuster.  Last seen by Dr. Sallyanne Kuster 02/03/2022.  She was having left chest pain when picking up her cat that was different than her anginal equivalent.  She was quite sedentary with shortness of breath.  Subsequent CT 03/13/2022 with bronchiectasis.  Evaluated subsequently by Dr. Silas Flood of pulmonology and recommended for flutter valve which she is waiting on delivery from the pharmacy.  Presents today for follow up with her daughter. Notes blood pressure has been labile at home with hypotensive readings. Systolic readings [id number]. She notes she is in the midst of having dental work. Last week at dentist her blood pressure was 194. Dr. Osborne Casco last note does mention situational hypertension. When her blood pressure is high she gets pain behind her eyes. Also has severe glaucoma. When her blood pressure is low she feels profoundly weak and winded. Her primary care instructed her to increase her Losartan to one tablet in the morning and one in the evening. She started doing this since last Wednesday.  Has not yet taken her medications this morning.  Her blood pressure cuff was found a profoundly inaccurate today reading >40 points higher than her manual reading.   Notes her cough resolved with Tessalon Pearles. However, feels she still has some congestion in her chest. It is worse in the morning.  Discussed as needed Mucinex and consideration of antihistamine for postnasal drip.Notes her breathing often feels better if she is able to have a bowel movemen tin her morning.   She drinks one cup of caffeinated coffee in the morning. She drinks at most 3 bottles of water per day.   Our manual check: 144/88 Her home cuff: 238/104  EKGs/Labs/Other Studies Reviewed:   The  following studies were reviewed today:  EKG:  EKG is not ordered today.    Recent Labs: 04/21/2021: BNP 177.4 10/07/2021: Magnesium 2.2; TSH 0.984 12/08/2021: ALT 16; BUN 17; Creatinine, Ser 0.97; Hemoglobin 12.1; Platelets 231; Potassium 5.1; Sodium 142  Recent Lipid Panel    Component Value Date/Time   CHOL 180 12/08/2021 1555   TRIG 75 12/08/2021 1555   HDL 76 12/08/2021 1555   CHOLHDL 2.4 12/08/2021 1555   CHOLHDL 3.0 11/04/2019 0617   VLDL 17 11/04/2019 0617   LDLCALC 90 12/08/2021 1555    Home Medications   Current Meds  Medication Sig   aspirin 81 MG tablet Take 81 mg by mouth daily.   Budeson-Glycopyrrol-Formoterol (BREZTRI AEROSPHERE) 160-9-4.8 MCG/ACT AERO Inhale 2 puffs into the lungs 2 (two) times daily as needed (  for flares).   Cholecalciferol (VITAMIN D3 SUPER STRENGTH) 50 MCG (2000 UT) CAPS Take 2,000 Units by mouth daily after lunch.   dorzolamide-timolol (COSOPT) 22.3-6.8 MG/ML ophthalmic solution Place 1 drop into the left eye 2 (two) times daily.    latanoprost (XALATAN) 0.005 % ophthalmic solution Place 1 drop into the left eye at bedtime.   levothyroxine (SYNTHROID) 100 MCG tablet Take 1 tablet (100 mcg total) by mouth daily before breakfast.   olmesartan (BENICAR) 20 MG tablet Take 1 tablet (20 mg total) by mouth daily.   pantoprazole (PROTONIX) 40 MG tablet TAKE 1 TABLET BY MOUTH TWICE A DAY   simvastatin (ZOCOR) 40 MG tablet Take 40 mg by mouth at bedtime.   XELPROS 0.005 % EMUL Place 1 drop into the left eye at bedtime.   [DISCONTINUED] losartan (COZAAR) 25 MG tablet TAKE 1 TABLET (25 MG TOTAL) BY MOUTH DAILY.     Review of Systems      All other systems reviewed and are otherwise negative except as noted above.  Physical Exam    VS:  BP (!) 144/88   Pulse 68   Ht '5\' 4"'$  (1.626 m)   Wt 113 lb (51.3 kg)   BMI 19.40 kg/m  , BMI Body mass index is 19.4 kg/m.  Wt Readings from Last 3 Encounters:  04/17/22 113 lb (51.3 kg)  03/30/22 113 lb 9.6 oz  (51.5 kg)  02/03/22 115 lb 6.4 oz (52.3 kg)     GEN: Well nourished, well developed, in no acute distress. HEENT: normal. Neck: Supple, no JVD, carotid bruits, or masses. Cardiac: RRR, no murmurs, rubs, or gallops. No clubbing, cyanosis, edema.  Radials/PT 2+ and equal bilaterally.  Respiratory:  Respirations regular and unlabored, clear to auscultation bilaterally. GI: Soft, nontender, nondistended. MS: No deformity or atrophy. Skin: Warm and dry, no rash. Neuro:  Strength and sensation are intact. Psych: Normal affect.  Assessment & Plan    Preop clearance - Pending dental work. Per AHA/ACC guidelines, she is deemed acceptable risk for the planned procedure without additional cardiovascular testing. Provided for paper clearance form for fillings which she brought to clinic today. Additional instructions for dentist given as well (do not use wrist cuff, let her sit 10 minutes and recheck). Encouraged her to take her blood pressure medication at least 2 hours prior to her dental visit.   CAD s/p CABG / HLD - Stable with no anginal symptoms. No indication for ischemic evaluation.  Continue aspirin, simvastatin. Heart healthy diet and regular cardiovascular exercise encouraged.    HTN - BP not at goal <130/80. Home BP cuff found to be markedly inaccurate.  Her daughter plans to purchase her a new one.  Will discontinue losartan and instead start olmesartan 20 mg daily as has better long-acting benefit.  Could increase dose in the future if needed but would hesitate given previous hypotension. Nurse visit in 2 weeks to recheck BP.  Glaucoma - Follows with ophthalmology.   HYPERTENSION CONTROL Vitals:   04/17/22 0914 04/17/22 0938  BP: (!) 160/90 (!) 144/88    The patient's blood pressure is elevated above target today.  In order to address the patient's elevated BP: A current anti-hypertensive medication was adjusted today.; Follow up with primary care provider for management.         Disposition: Follow up  in 3-4 months  with Sanda Klein, MD or APP.  Signed, Loel Dubonnet, NP 04/17/2022, 10:47 AM [place]

## 2022-04-17 NOTE — Patient Instructions (Signed)
Medication Instructions:  Your physician has recommended you make the following change in your medication:   STOP Losartan  START Olmesartan one '20mg'$  tablet daily   Recommend using over the counter Guafenesin (Muccinex) as needed for chest congestion  The 12 hour tablet is often easier  For post nasal drip (sensation of phlegm in your throat in the morning) can try taking Claritine (Loratidine) or Allegra.   Recommend tring Biotene dry mouth rinse every evening.  *If you need a refill on your cardiac medications before your next appointment, please call your pharmacy*  Lab Work/Testing/Procedures: None ordered today.   Follow-Up: At [facility], you and your health needs are our priority.  As part of our continuing mission to provide you with exceptional heart care, we have created designated Provider Care Teams.  These Care Teams include your primary Cardiologist (physician) and Advanced Practice Providers (APPs -  Physician Assistants and Nurse Practitioners) who all work together to provide you with the care you need, when you need it.  We recommend signing up for the patient portal called "MyChart".  Sign up information is provided on this After Visit Summary.  MyChart is used to connect with patients for Virtual Visits (Telemedicine).  Patients are able to view lab/test results, encounter notes, upcoming appointments, etc.  Non-urgent messages can be sent to your provider as well.   To learn more about what you can do with MyChart, go to [email or web address].    Your next appointment:    2 weeks for nurse visit   AND   In 3-4 months with Sanda Klein, MD  or Advanced Practice Provider  Other Instructions  Tips to Measure your Blood Pressure Correctly Here's what you can do to ensure a correct reading:  Don't drink a caffeinated beverage or smoke during the 30 minutes before the test.  Sit quietly for five minutes before the test begins.  During the  measurement, sit in a chair with your feet on the floor and your arm supported so your elbow is at about heart level.  The inflatable part of the cuff should completely cover at least 80% of your upper arm, and the cuff should be placed on bare skin, not over a shirt.  Don't talk during the measurement.  Have your blood pressure measured twice, with a brief break in between. If the readings are different by 5 points or more, have it done a third time.  Blood pressure categories  Blood pressure category SYSTOLIC (upper number)  DIASTOLIC (lower number)  Normal Less than 120 mm Hg and Less than 80 mm Hg  Elevated 120-129 mm Hg and Less than 80 mm Hg  High blood pressure: Stage 1 hypertension 130-139 mm Hg or 80-89 mm Hg  High blood pressure: Stage 2 hypertension 140 mm Hg or higher or 90 mm Hg or higher  Hypertensive crisis (consult your doctor immediately) Higher than 180 mm Hg and/or Higher than 120 mm Hg  Source: American Heart Association and American Stroke Association. For more on getting your blood pressure under control, buy Controlling Your Blood Pressure, a Special Health Report from [facility].   Blood Pressure Log   Date   Time  Blood Pressure  Example: Nov 1 9 AM 124/78

## 2022-04-19 ENCOUNTER — Ambulatory Visit (INDEPENDENT_AMBULATORY_CARE_PROVIDER_SITE_OTHER): Payer: Medicare Other | Admitting: Family Medicine

## 2022-04-19 ENCOUNTER — Encounter: Payer: Self-pay | Admitting: Family Medicine

## 2022-04-19 VITALS — BP 162/62 | HR 70 | Temp 98.2°F | Ht 64.0 in | Wt 111.0 lb

## 2022-04-19 DIAGNOSIS — R0609 Other forms of dyspnea: Secondary | ICD-10-CM

## 2022-04-19 DIAGNOSIS — R202 Paresthesia of skin: Secondary | ICD-10-CM

## 2022-04-19 DIAGNOSIS — M791 Myalgia, unspecified site: Secondary | ICD-10-CM

## 2022-04-19 DIAGNOSIS — I119 Hypertensive heart disease without heart failure: Secondary | ICD-10-CM | POA: Diagnosis not present

## 2022-04-19 DIAGNOSIS — R002 Palpitations: Secondary | ICD-10-CM

## 2022-04-19 DIAGNOSIS — R5383 Other fatigue: Secondary | ICD-10-CM

## 2022-04-19 DIAGNOSIS — R0789 Other chest pain: Secondary | ICD-10-CM

## 2022-04-19 DIAGNOSIS — R252 Cramp and spasm: Secondary | ICD-10-CM

## 2022-04-19 NOTE — Progress Notes (Incomplete)
Acute Office Visit  Subjective:    Patient ID: Leah Pittman, female    DOB: 05-17-1930, 89 y.o.   MRN: [id number]  Chief Complaint  Patient presents with  . Hypertension    HPI: Patient is in today for hypertension. States her b/p has been elevated for the past 2 weeks and was unable to have dental surgery done due to this. Has been taking losartan 25 mg BID as directed. Was seen by NP-Walker who started her on Olmesartan 20 mg daily and suggested she d/c the losartan. States when her b/p is elevated she at times she will have a headache. States she has not stared on olmesartan.  BP in am 110/40s. Pm:220/80. Patient has been to the hospital twice in the last 2 months.   Dr. Silas Flood (pulm) took her off albuterol.     Past Medical History:  Diagnosis Date  . Anxiety   . Arthritis   . Chest pain 2011   CARDIOLITE - no significant symptoms, EKG changes, or arrhythmias  . Congenital prolapse of bladder mucosa   . Diverticulosis   . Dyspnea 02/16/2012   2D ECHO - EF 55-60%, normal  . Esophageal dysphagia   . Fatigue   . GERD (gastroesophageal reflux disease)   . Glaucoma   . Headache(784.0)   . Heart attack ([place])   . Hiatal hernia   . High blood pressure 02/16/2012   RENAL DOPPLER - normal  . Hyperlipidemia   . Hypothyroidism   . Labile hypertension   . Lightheadedness   . Migraine headache   . Multiple allergies   . Myalgia   . NSTEMI (non-ST elevated myocardial infarction) ([place]) 07/14/2020  . OSA (obstructive sleep apnea)   . Pain, lower leg    Calf  . Problem of menstruation   . Proteinuria   . Reflux   . SOB (shortness of breath)   . Thyroid disease   . Urinary problem   . Valvular heart disease   . Vitamin B12 deficiency   . Vitamin D deficiency     Past Surgical History:  Procedure Laterality Date  . ABDOMINAL HYSTERECTOMY  1976  . CHOLECYSTECTOMY  2000  . COLONOSCOPY  07/27/2014   Moderate predominantly sigmoid divertuculosis. Otherwise normal  colonoscopy  . CORONARY ARTERY BYPASS GRAFT N/A 07/10/2018   Procedure: CORONARY ARTERY BYPASS GRAFTING (CABG), FREE LIMA;  Surgeon: Gaye Pollack, MD;  Location: [facility];  Service: Open Heart Surgery;  Laterality: N/A;  . CYST REMOVAL NECK    . CYSTECTOMY  1974   Intestine  . CYSTECTOMY  1996   Brain stem  . ESOPHAGOGASTRODUODENOSCOPY  12/07/2016   Schatzki's ring status post esophageal dilatation. Small hiatal hernia. Mild gastritis  . EYE SURGERY  2003  . EYE SURGERY  07/2016  . LEFT HEART CATH AND CORONARY ANGIOGRAPHY N/A 07/08/2018   Procedure: LEFT HEART CATH AND CORONARY ANGIOGRAPHY;  Surgeon: Lorretta Harp, MD;  Location: [facility];  Service: Cardiovascular;  Laterality: N/A;  . LEFT HEART CATH AND CORONARY ANGIOGRAPHY N/A 07/25/2019   Procedure: LEFT HEART CATH AND CORONARY ANGIOGRAPHY;  Surgeon: Burnell Blanks, MD;  Location: [facility];  Service: Cardiovascular;  Laterality: N/A;  . MOUTH SURGERY  2013  . RADIOLOGY WITH ANESTHESIA N/A 11/05/2019   Procedure: MRI WITH ANESTHESIA;  Surgeon: Radiologist, Medication, MD;  Location: [facility];  Service: Radiology;  Laterality: N/A;  . TEE WITHOUT CARDIOVERSION N/A 07/10/2018   Procedure: TRANSESOPHAGEAL ECHOCARDIOGRAM (TEE);  Surgeon: Gaye Pollack, MD;  Location: [place] [facility];  Service: Open Heart Surgery;  Laterality: N/A;  . TONSILLECTOMY     age 36    Family History  Problem Relation Age of Onset  . Other Mother        blood disorder - unsure of name  . Heart attack Father   . Stroke Father   . Cancer Sister   . Cancer Brother   . Asthma Brother   . Cancer Brother     Social History   Socioeconomic History  . Marital status: Married    Spouse name: Not on file  . Number of children: 3  . Years of education: college  . Highest education level: Not on file  Occupational History  . Occupation: Retired  Tobacco Use  . Smoking status: Never  . Smokeless tobacco: Never  Vaping Use  . Vaping Use:  Never used  Substance and Sexual Activity  . Alcohol use: No  . Drug use: No  . Sexual activity: Not on file  Other Topics Concern  . Not on file  Social History Narrative   Lives with husband in [place], [place].   Right-handed.   No daily caffeine use.   Social Determinants of Health   Financial Resource Strain: Medium Risk (10/19/2021)   Overall Financial Resource Strain (CARDIA)   . Difficulty of Paying Living Expenses: Somewhat hard  Food Insecurity: No Food Insecurity (10/19/2021)   Hunger Vital Sign   . Worried About Charity fundraiser in the Last Year: Never true   . Ran Out of Food in the Last Year: Never true  Transportation Needs: No Transportation Needs (10/19/2021)   PRAPARE - Transportation   . Lack of Transportation (Medical): No   . Lack of Transportation (Non-Medical): No  Physical Activity: Inactive (05/11/2021)   Exercise Vital Sign   . Days of Exercise per Week: 0 days   . Minutes of Exercise per Session: 0 min  Stress: No Stress Concern Present (05/11/2021)   [place]   . Feeling of Stress : Only a little  Social Connections: Moderately Integrated (04/19/2022)   Social Connection and Isolation Panel [NHANES]   . Frequency of Communication with Friends and Family: Once a week   . Frequency of Social Gatherings with Friends and Family: Twice a week   . Attends Religious Services: More than 4 times per year   . Active Member of Clubs or Organizations: No   . Attends Archivist Meetings: Never   . Marital Status: Married  Human resources officer Violence: Not At Risk (05/11/2021)   Humiliation, Afraid, Rape, and Kick questionnaire   . Fear of Current or Ex-Partner: No   . Emotionally Abused: No   . Physically Abused: No   . Sexually Abused: No    Outpatient Medications Prior to Visit  Medication Sig Dispense Refill  . aspirin 81 MG tablet Take 81 mg by mouth daily.    .  Budeson-Glycopyrrol-Formoterol (BREZTRI AEROSPHERE) 160-9-4.8 MCG/ACT AERO Inhale 2 puffs into the lungs 2 (two) times daily as needed (for flares). 10.7 g 11  . Cholecalciferol (VITAMIN D3 SUPER STRENGTH) 50 MCG (2000 UT) CAPS Take 2,000 Units by mouth daily after lunch.    . dorzolamide-timolol (COSOPT) 22.3-6.8 MG/ML ophthalmic solution Place 1 drop into the left eye 2 (two) times daily.     Marland Kitchen latanoprost (XALATAN) 0.005 % ophthalmic solution Place 1 drop into the left eye at  bedtime.    Marland Kitchen levothyroxine (SYNTHROID) 100 MCG tablet Take 1 tablet (100 mcg total) by mouth daily before breakfast. 90 tablet 1  . olmesartan (BENICAR) 20 MG tablet Take 1 tablet (20 mg total) by mouth daily. 90 tablet 1  . pantoprazole (PROTONIX) 40 MG tablet TAKE 1 TABLET BY MOUTH TWICE A DAY 180 tablet 1  . simvastatin (ZOCOR) 40 MG tablet Take 40 mg by mouth at bedtime.    . XELPROS 0.005 % EMUL Place 1 drop into the left eye at bedtime.     No facility-administered medications prior to visit.    Allergies  Allergen Reactions  . Benadryl [Diphenhydramine] Swelling and Other (See Comments)    Tongue swells and hallucinates- could not talk  . Lipase Concentrate-Hp [Digestive Enzymes] Rash  . Zenpep [Pancrelipase (Lip-Prot-Amyl)] Rash  . Codeine Nausea And Vomiting and Hypertension  . Meperidine Nausea Only, Swelling and Other (See Comments)    Tongue swells and "I feel like death"  . Other Other (See Comments)    SKIN IS VERY SENSITIVE AND IT TEARS EASILY!!  . Prednisone Hypertension  . Promethazine Other (See Comments)    Hypotension   . Propranolol Nausea And Vomiting  . Shellfish Allergy Nausea And Vomiting  . Tape Other (See Comments)    Tears the skin  . Tazarotene Other (See Comments)    Reaction not recalled- "Tazarotene, commonly marketed as Tazorac, Avage, and Zorac, is member of the acetylenic class of retinoids."   . Iodinated Contrast Media Nausea Only and Rash    Review of Systems   Constitutional:  Negative for chills, fatigue and fever.  HENT:  Negative for congestion, ear pain, rhinorrhea and sore throat.   Respiratory:  Positive for shortness of breath (DOE). Negative for cough.   Cardiovascular:  Negative for chest pain.  Gastrointestinal:  Negative for abdominal pain, constipation, diarrhea, nausea and vomiting.  Neurological:  Negative for dizziness, weakness, light-headedness and headaches.       Objective:        04/19/2022    1:27 PM 04/17/2022    9:38 AM 04/17/2022    9:14 AM  Vitals with BMI  Height '5\' 4"'$   '5\' 4"'$   Weight 111 lbs  113 lbs  BMI [id number]  [id number]  Systolic 0000000 [id number] 0000000  Diastolic 62 88 90  Pulse 70  68    No data found.   Physical Exam Vitals reviewed.  Constitutional:      Appearance: Normal appearance. She is normal weight.  Neck:     Vascular: No carotid bruit.  Cardiovascular:     Rate and Rhythm: Normal rate and regular rhythm.     Heart sounds: Normal heart sounds.  Pulmonary:     Effort: Pulmonary effort is normal. No respiratory distress.     Breath sounds: Normal breath sounds.  Abdominal:     General: Abdomen is flat. Bowel sounds are normal.     Palpations: Abdomen is soft.     Tenderness: There is no abdominal tenderness.  Neurological:     Mental Status: She is alert and oriented to person, place, and time.  Psychiatric:        Mood and Affect: Mood normal.        Behavior: Behavior normal.     Health Maintenance Due  Topic Date Due  . Zoster Vaccines- Shingrix (1 of 2) Never done  . Pneumonia Vaccine 21+ Years old (1 of 1 - PCV) Never done  . INFLUENZA VACCINE  Never done  . Medicare Annual Wellness (AWV)  05/12/2022    There are no preventive care reminders to display for this patient.   Lab Results  Component Value Date   TSH 0.984 10/07/2021   Lab Results  Component Value Date   WBC 9.1 12/08/2021   HGB 12.1 12/08/2021   HCT 36.5 12/08/2021   MCV 93 12/08/2021   PLT 231 12/08/2021   Lab  Results  Component Value Date   NA 142 12/08/2021   K 5.1 12/08/2021   CO2 24 12/08/2021   GLUCOSE 93 12/08/2021   BUN 17 12/08/2021   CREATININE 0.97 12/08/2021   BILITOT 0.4 12/08/2021   ALKPHOS 68 12/08/2021   AST 17 12/08/2021   ALT 16 12/08/2021   PROT 6.9 12/08/2021   ALBUMIN 4.6 12/08/2021   CALCIUM 10.5 (H) 12/08/2021   ANIONGAP 7 01/05/2021   EGFR 56 (L) 12/08/2021   GFR 60.94 01/27/2020   Lab Results  Component Value Date   CHOL 180 12/08/2021   Lab Results  Component Value Date   HDL 76 12/08/2021   Lab Results  Component Value Date   LDLCALC 90 12/08/2021   Lab Results  Component Value Date   TRIG 75 12/08/2021   Lab Results  Component Value Date   CHOLHDL 2.4 12/08/2021   Lab Results  Component Value Date   HGBA1C 5.9 (H) 12/08/2021       Assessment & Plan:  There are no diagnoses linked to this encounter.   No orders of the defined types were placed in this encounter.   No orders of the defined types were placed in this encounter.    Follow-up: No follow-ups on file.  An After Visit Summary was printed and given to the patient.  Rochel Brome, MD [facility] [phone]

## 2022-04-19 NOTE — Progress Notes (Unsigned)
Acute Office Visit  Subjective:    Patient ID: Leah Pittman, female    DOB: Apr 16, 1930, 89 y.o.   MRN: [id number]  Chief Complaint  Patient presents with   Hypertension    HPI: Patient is in today for hypertension. States her b/p has been elevated for the past 2 weeks and was unable to have dental surgery done due to this. Has been taking losartan 25 mg BID as directed. Was seen by NP-Walker who started her on Olmesartan 20 mg daily and suggested she d/c the losartan. States when her b/p is elevated she at times she will have a headache. States she has not stared on olmesartan.  BP in am 110/40s. Pm:220/80. Patient has been to the hospital twice in the last 2 months.   Dr. Silas Flood (pulm) took her off albuterol.     Past Medical History:  Diagnosis Date   Anxiety    Arthritis    Chest pain 2011   CARDIOLITE - no significant symptoms, EKG changes, or arrhythmias   Congenital prolapse of bladder mucosa    Diverticulosis    Dyspnea 02/16/2012   2D ECHO - EF 55-60%, normal   Esophageal dysphagia    Fatigue    GERD (gastroesophageal reflux disease)    Glaucoma    Headache(784.0)    Heart attack ([place])    Hiatal hernia    High blood pressure 02/16/2012   RENAL DOPPLER - normal   Hyperlipidemia    Hypothyroidism    Labile hypertension    Lightheadedness    Migraine headache    Multiple allergies    Myalgia    NSTEMI (non-ST elevated myocardial infarction) ([facility]) 07/14/2020   OSA (obstructive sleep apnea)    Pain, lower leg    Calf   Problem of menstruation    Proteinuria    Reflux    SOB (shortness of breath)    Thyroid disease    Urinary problem    Valvular heart disease    Vitamin B12 deficiency    Vitamin D deficiency     Past Surgical History:  Procedure Laterality Date   ABDOMINAL HYSTERECTOMY  1976   CHOLECYSTECTOMY  2000   COLONOSCOPY  07/27/2014   Moderate predominantly sigmoid divertuculosis. Otherwise normal colonoscopy   CORONARY ARTERY BYPASS  GRAFT N/A 07/10/2018   Procedure: CORONARY ARTERY BYPASS GRAFTING (CABG), FREE LIMA;  Surgeon: Gaye Pollack, MD;  Location: [facility];  Service: Open Heart Surgery;  Laterality: N/A;   CYST REMOVAL NECK     CYSTECTOMY  1974   Intestine   CYSTECTOMY  1996   Brain stem   ESOPHAGOGASTRODUODENOSCOPY  12/07/2016   Schatzki's ring status post esophageal dilatation. Small hiatal hernia. Mild gastritis   EYE SURGERY  2003   EYE SURGERY  07/2016   LEFT HEART CATH AND CORONARY ANGIOGRAPHY N/A 07/08/2018   Procedure: LEFT HEART CATH AND CORONARY ANGIOGRAPHY;  Surgeon: Lorretta Harp, MD;  Location: [facility];  Service: Cardiovascular;  Laterality: N/A;   LEFT HEART CATH AND CORONARY ANGIOGRAPHY N/A 07/25/2019   Procedure: LEFT HEART CATH AND CORONARY ANGIOGRAPHY;  Surgeon: Burnell Blanks, MD;  Location: [facility];  Service: Cardiovascular;  Laterality: N/A;   MOUTH SURGERY  2013   RADIOLOGY WITH ANESTHESIA N/A 11/05/2019   Procedure: MRI WITH ANESTHESIA;  Surgeon: Radiologist, Medication, MD;  Location: [facility];  Service: Radiology;  Laterality: N/A;   TEE WITHOUT CARDIOVERSION N/A 07/10/2018   Procedure: TRANSESOPHAGEAL ECHOCARDIOGRAM (TEE);  Surgeon: Gaye Pollack, MD;  Location: [facility];  Service: Open Heart Surgery;  Laterality: N/A;   TONSILLECTOMY     age 34    Family History  Problem Relation Age of Onset   Other Mother        blood disorder - unsure of name   Heart attack Father    Stroke Father    Cancer Sister    Cancer Brother    Asthma Brother    Cancer Brother     Social History   Socioeconomic History   Marital status: Married    Spouse name: Not on file   Number of children: 3   Years of education: college   Highest education level: Not on file  Occupational History   Occupation: Retired  Tobacco Use   Smoking status: Never   Smokeless tobacco: Never  Scientific laboratory technician Use: Never used  Substance and Sexual Activity   Alcohol use: No    Drug use: No   Sexual activity: Not on file  Other Topics Concern   Not on file  Social History Narrative   Lives with husband in [place], [place].   Right-handed.   No daily caffeine use.   Social Determinants of Health   Financial Resource Strain: Medium Risk (10/19/2021)   Overall Financial Resource Strain (CARDIA)    Difficulty of Paying Living Expenses: Somewhat hard  Food Insecurity: No Food Insecurity (10/19/2021)   Hunger Vital Sign    Worried About Running Out of Food in the Last Year: Never true    Ran Out of Food in the Last Year: Never true  Transportation Needs: No Transportation Needs (10/19/2021)   PRAPARE - Hydrologist (Medical): No    Lack of Transportation (Non-Medical): No  Physical Activity: Inactive (05/11/2021)   Exercise Vital Sign    Days of Exercise per Week: 0 days    Minutes of Exercise per Session: 0 min  Stress: No Stress Concern Present (05/11/2021)   [place]    Feeling of Stress : Only a little  Social Connections: Moderately Integrated (04/19/2022)   Social Connection and Isolation Panel [NHANES]    Frequency of Communication with Friends and Family: Once a week    Frequency of Social Gatherings with Friends and Family: Twice a week    Attends Religious Services: More than 4 times per year    Active Member of [employer] or Organizations: No    Attends Archivist Meetings: Never    Marital Status: Married  Human resources officer Violence: Not At Risk (05/11/2021)   Humiliation, Afraid, Rape, and Kick questionnaire    Fear of Current or Ex-Partner: No    Emotionally Abused: No    Physically Abused: No    Sexually Abused: No    Outpatient Medications Prior to Visit  Medication Sig Dispense Refill   aspirin 81 MG tablet Take 81 mg by mouth daily.     Budeson-Glycopyrrol-Formoterol (BREZTRI AEROSPHERE) 160-9-4.8 MCG/ACT AERO Inhale 2 puffs into the lungs 2  (two) times daily as needed (for flares). 10.7 g 11   Cholecalciferol (VITAMIN D3 SUPER STRENGTH) 50 MCG (2000 UT) CAPS Take 2,000 Units by mouth daily after lunch.     dorzolamide-timolol (COSOPT) 22.3-6.8 MG/ML ophthalmic solution Place 1 drop into the left eye 2 (two) times daily.      latanoprost (XALATAN) 0.005 % ophthalmic solution Place 1 drop into the left eye at  bedtime.     levothyroxine (SYNTHROID) 100 MCG tablet Take 1 tablet (100 mcg total) by mouth daily before breakfast. 90 tablet 1   olmesartan (BENICAR) 20 MG tablet Take 1 tablet (20 mg total) by mouth daily. 90 tablet 1   pantoprazole (PROTONIX) 40 MG tablet TAKE 1 TABLET BY MOUTH TWICE A DAY 180 tablet 1   simvastatin (ZOCOR) 40 MG tablet Take 40 mg by mouth at bedtime.     XELPROS 0.005 % EMUL Place 1 drop into the left eye at bedtime.     No facility-administered medications prior to visit.    Allergies  Allergen Reactions   Benadryl [Diphenhydramine] Swelling and Other (See Comments)    Tongue swells and hallucinates- could not talk   Lipase Concentrate-Hp [Digestive Enzymes] Rash   Zenpep [Pancrelipase (Lip-Prot-Amyl)] Rash   Codeine Nausea And Vomiting and Hypertension   Meperidine Nausea Only, Swelling and Other (See Comments)    Tongue swells and "I feel like death"   Other Other (See Comments)    SKIN IS VERY SENSITIVE AND IT TEARS EASILY!!   Prednisone Hypertension   Promethazine Other (See Comments)    Hypotension    Propranolol Nausea And Vomiting   Shellfish Allergy Nausea And Vomiting   Tape Other (See Comments)    Tears the skin   Tazarotene Other (See Comments)    Reaction not recalled- "Tazarotene, commonly marketed as Tazorac, Avage, and Zorac, is member of the acetylenic class of retinoids."    Iodinated Contrast Media Nausea Only and Rash    Review of Systems  Constitutional:  Negative for chills, fatigue and fever.  HENT:  Negative for congestion, ear pain, rhinorrhea and sore throat.    Respiratory:  Positive for shortness of breath (DOE). Negative for cough.   Cardiovascular:  Negative for chest pain.  Gastrointestinal:  Negative for abdominal pain, constipation, diarrhea, nausea and vomiting.  Neurological:  Negative for dizziness, weakness, light-headedness and headaches.       Objective:        04/19/2022    1:27 PM 04/17/2022    9:38 AM 04/17/2022    9:14 AM  Vitals with BMI  Height '5\' 4"'$   '5\' 4"'$   Weight 111 lbs  113 lbs  BMI [id number]  [id number]  Systolic 0000000 [id number] 0000000  Diastolic 62 88 90  Pulse 70  68    No data found.   Physical Exam Vitals reviewed.  Constitutional:      Appearance: Normal appearance. She is normal weight.  Neck:     Vascular: No carotid bruit.  Cardiovascular:     Rate and Rhythm: Normal rate and regular rhythm.     Heart sounds: Normal heart sounds.     Comments: Nontender over her chest.   Pulmonary:     Effort: Pulmonary effort is normal. No respiratory distress.     Breath sounds: Normal breath sounds.  Abdominal:     General: Abdomen is flat. Bowel sounds are normal.     Palpations: Abdomen is soft.     Tenderness: There is abdominal tenderness (epigastric. and diffuse.).  Neurological:     Mental Status: She is alert and oriented to person, place, and time.  Psychiatric:        Mood and Affect: Mood normal.        Behavior: Behavior normal.     Health Maintenance Due  Topic Date Due   Zoster Vaccines- Shingrix (1 of 2) Never done   Pneumonia Vaccine 65+ Years  old (1 of 1 - PCV) Never done   INFLUENZA VACCINE  Never done   Medicare Annual Wellness (AWV)  05/12/2022    There are no preventive care reminders to display for this patient.   Lab Results  Component Value Date   TSH 0.984 10/07/2021   Lab Results  Component Value Date   WBC 9.1 12/08/2021   HGB 12.1 12/08/2021   HCT 36.5 12/08/2021   MCV 93 12/08/2021   PLT 231 12/08/2021   Lab Results  Component Value Date   NA 142 12/08/2021   K 5.1  12/08/2021   CO2 24 12/08/2021   GLUCOSE 93 12/08/2021   BUN 17 12/08/2021   CREATININE 0.97 12/08/2021   BILITOT 0.4 12/08/2021   ALKPHOS 68 12/08/2021   AST 17 12/08/2021   ALT 16 12/08/2021   PROT 6.9 12/08/2021   ALBUMIN 4.6 12/08/2021   CALCIUM 10.5 (H) 12/08/2021   ANIONGAP 7 01/05/2021   EGFR 56 (L) 12/08/2021   GFR 60.94 01/27/2020   Lab Results  Component Value Date   CHOL 180 12/08/2021   Lab Results  Component Value Date   HDL 76 12/08/2021   Lab Results  Component Value Date   LDLCALC 90 12/08/2021   Lab Results  Component Value Date   TRIG 75 12/08/2021   Lab Results  Component Value Date   CHOLHDL 2.4 12/08/2021   Lab Results  Component Value Date   HGBA1C 5.9 (H) 12/08/2021       Assessment & Plan:  Paresthesia Assessment & Plan: Check labs  Orders: -     B12 and Folate Panel -     CBC with Differential/Platelet -     Comprehensive metabolic panel -     TSH -     Methylmalonic acid, serum -     Magnesium  Other fatigue Assessment & Plan: Check labs  Orders: -     B12 and Folate Panel -     CBC with Differential/Platelet -     Comprehensive metabolic panel -     TSH -     Methylmalonic acid, serum  Hypertensive arteriosclerotic cardiovascular disease Assessment & Plan: Keep follow up appt with cardiology for bp in 2 weeks.  Recommended start olmesartan 20 mg daily and stop losartan as instructed by cardiology.  ONLY check bps twice daily.  Get new bp cuff.  Orders: -     B12 and Folate Panel -     CBC with Differential/Platelet -     Comprehensive metabolic panel -     TSH -     Methylmalonic acid, serum -     Magnesium  Myalgia Assessment & Plan: Check labs  Orders: -     TSH -     Phosphorus -     CK -     T4, free -     Magnesium  Other chest pain Assessment & Plan: Keep cardiac follow up.   Orders: -     Magnesium  Palpitations Assessment & Plan: Check labs.   Orders: -     Magnesium  DOE  (dyspnea on exertion) Assessment & Plan: Patient needs to take breztri 2 puffs twice daily.  Follow up with Dr. Silas Flood if not improving.       No orders of the defined types were placed in this encounter.   Orders Placed This Encounter  Procedures   B12 and Folate Panel   CBC with Differential/Platelet   Comprehensive metabolic panel  TSH   Methylmalonic acid, serum   Phosphorus   CK   T4, free   Magnesium     Follow-up: Return in about 6 weeks (around 05/31/2022) for chronic fasting.  An After Visit Summary was printed and given to the patient.  Rochel Brome, MD [facility] [phone]

## 2022-04-20 ENCOUNTER — Encounter: Payer: Self-pay | Admitting: Family Medicine

## 2022-04-20 DIAGNOSIS — R002 Palpitations: Secondary | ICD-10-CM | POA: Insufficient documentation

## 2022-04-20 DIAGNOSIS — R0789 Other chest pain: Secondary | ICD-10-CM

## 2022-04-20 HISTORY — DX: Palpitations: R00.2

## 2022-04-20 HISTORY — DX: Other chest pain: R07.89

## 2022-04-20 LAB — MAGNESIUM: Magnesium: 2.4 mg/dL — ABNORMAL HIGH (ref 1.6–2.3)

## 2022-04-20 NOTE — Assessment & Plan Note (Signed)
Check labs 

## 2022-04-20 NOTE — Assessment & Plan Note (Signed)
Patient needs to take breztri 2 puffs twice daily.  Follow up with Dr. Silas Flood if not improving.

## 2022-04-20 NOTE — Assessment & Plan Note (Signed)
Keep follow up appt with cardiology for bp in 2 weeks.  Recommended start olmesartan 20 mg daily and stop losartan as instructed by cardiology.  ONLY check bps twice daily.  Get new bp cuff.

## 2022-04-20 NOTE — Assessment & Plan Note (Signed)
Keep cardiac follow up.

## 2022-04-21 ENCOUNTER — Other Ambulatory Visit: Payer: Self-pay

## 2022-04-21 MED ORDER — LEVOTHYROXINE SODIUM 112 MCG PO TABS: 112.0000 ug | ORAL_TABLET | Freq: Every day | ORAL | 1 refills | Status: DC

## 2022-04-21 NOTE — Progress Notes (Signed)
Blood count normal.  Liver function normal.  Kidney function normal.  Thyroid function slightly off. Recommend increase synthroid to 112 mcg once daily in am.  Repeat thyroid levels in 6 weeks. (Tsh and free T4. ) Magnesium, phosphorus, b12, folate, mma normal.

## 2022-04-24 ENCOUNTER — Telehealth: Payer: Self-pay

## 2022-04-24 LAB — CK: Total CK: 66 U/L (ref 26–161)

## 2022-04-24 LAB — T4, FREE: Free T4: 1.58 ng/dL (ref 0.82–1.77)

## 2022-04-24 LAB — CBC WITH DIFFERENTIAL/PLATELET
Basophils Absolute: 0.1 10*3/uL (ref 0.0–0.2)
Basos: 1 %
EOS (ABSOLUTE): 0.6 10*3/uL — ABNORMAL HIGH (ref 0.0–0.4)
Eos: 7 %
Hematocrit: 36.8 % (ref 34.0–46.6)
Hemoglobin: 12.1 g/dL (ref 11.1–15.9)
Immature Grans (Abs): 0 10*3/uL (ref 0.0–0.1)
Immature Granulocytes: 0 %
Lymphocytes Absolute: 2 10*3/uL (ref 0.7–3.1)
Lymphs: 24 %
MCH: 30 pg (ref 26.6–33.0)
MCHC: 32.9 g/dL (ref 31.5–35.7)
MCV: 91 fL (ref 79–97)
Monocytes Absolute: 0.9 10*3/uL (ref 0.1–0.9)
Monocytes: 11 %
Neutrophils Absolute: 4.7 10*3/uL (ref 1.4–7.0)
Neutrophils: 57 %
Platelets: 245 10*3/uL (ref 150–450)
RBC: 4.03 x10E6/uL (ref 3.77–5.28)
RDW: 13.6 % (ref 11.7–15.4)
WBC: 8.3 10*3/uL (ref 3.4–10.8)

## 2022-04-24 LAB — COMPREHENSIVE METABOLIC PANEL
ALT: 12 IU/L (ref 0–32)
AST: 13 IU/L (ref 0–40)
Albumin/Globulin Ratio: 2.2 (ref 1.2–2.2)
Albumin: 4.6 g/dL (ref 3.7–4.7)
Alkaline Phosphatase: 76 IU/L (ref 44–121)
BUN/Creatinine Ratio: 24 (ref 12–28)
BUN: 20 mg/dL (ref 8–27)
Bilirubin Total: 0.3 mg/dL (ref 0.0–1.2)
CO2: 22 mmol/L (ref 20–29)
Calcium: 10.5 mg/dL — ABNORMAL HIGH (ref 8.7–10.3)
Chloride: 102 mmol/L (ref 96–106)
Creatinine, Ser: 0.82 mg/dL (ref 0.57–1.00)
Globulin, Total: 2.1 g/dL (ref 1.5–4.5)
Glucose: 84 mg/dL (ref 70–99)
Potassium: 5 mmol/L (ref 3.5–5.2)
Sodium: 140 mmol/L (ref 134–144)
Total Protein: 6.7 g/dL (ref 6.0–8.5)
eGFR: 68 mL/min/{1.73_m2} (ref >59)

## 2022-04-24 LAB — B12 AND FOLATE PANEL
Folate: 14 ng/mL (ref >3.0)
Vitamin B-12: 401 pg/mL (ref 232–1245)

## 2022-04-24 LAB — PHOSPHORUS: Phosphorus: 3.7 mg/dL (ref 3.0–4.3)

## 2022-04-24 LAB — METHYLMALONIC ACID, SERUM: Methylmalonic Acid: 224 nmol/L (ref 0–378)

## 2022-04-24 LAB — TSH: TSH: 0.483 u[IU]/mL (ref 0.450–4.500)

## 2022-04-24 NOTE — Progress Notes (Signed)
Care Management & Coordination Services Pharmacy Team  Reason for Encounter: Hypertension  Contacted patient to discuss hypertension disease state. Spoke with patient on 04/24/2022     Current antihypertensive regimen:  Olmesartan 20mg  daily  Hydralazine HCI 10mg ( Has not picked up from pharmacy yet) Patient verbally confirms she is taking the above medications as directed. Yes  How often are you checking your Blood Pressure? twice daily  she checks her blood pressure in the morning and at nighttime before taking her medication.  Current home BP readings:  04/23/22 124/46 74, 134/51 72 04/24/22 109/39 78 felt very weak this morning when she first got up, waited an hour later and checked it again and it was 111/54 64 04/25/22 102/41 69, 176/61 68 , 171/61 63  04/26/22 155/58 70, 144/53 68 6:15, 108/49 69  04/27/22 138/57 64, 134/51 72 04/28/22 146/54 69, 140/81 66, 132/67 67 04/29/22 106/51 81, 103/52 78, 154/79 71 04/30/22 146/64 71, 159/89 68 05/01/22 150/75 69, 125/58 71 05/02/22 155/83 70  Wrist or arm cuff: Arm  Caffeine intake:1 Cup of coffee Salt intake:None  Any readings above 180/100? No  What recent interventions/DTPs have been made by any provider to improve Blood Pressure control since last CPP Visit: Pt was recently put on Hydralazine 10mg  by Cardiologist but as of 05/02/22, pt has not started this yet.   Any recent hospitalizations or ED visits since last visit with CPP? No  What diet changes have been made to improve Blood Pressure Control?  No diet changes   What exercise is being done to improve your Blood Pressure Control?  Pt has not picked up regular exercise due to feeling weak most mornings.   Adherence Review: Is the patient currently on ACE/ARB medication? Yes Does the patient have >5 day gap between last estimated fill dates? No  Star Rating Drugs:  Medication:  Last Fill: Day Supply Olmesartan   04/17/22 90ds  Chart Updates: Recent office visits:  04/21/22  Philipp Ovens CMA. Orders Only. Increased Levothyroxine to 125mcg.   04/19/22 Rochel Brome MD. Seen for routine visit. Recommended start olmesartan 20 mg daily and stop losartan as instructed by cardiology.   Recent consult visits:  05/01/22 (Cardiologist) Alvina Filbert LPN. Seen for HTN. Started on Hydralazine HCI 10mg  prn for readings 170 or above.   04/17/22 (Cardiology) Laurann Montana NP. Seen for Preoperative Clearance. Started Olmesartan Medoxomil 20mg  daily. D/C Albuterol Inhaler, Losartan Potassium 25mg , Rosuvastatin Calcium and Sennoside Docusate.   Hospital visits:  None  Medications: Outpatient Encounter Medications as of 04/24/2022  Medication Sig   levothyroxine (SYNTHROID) 112 MCG tablet Take 1 tablet (112 mcg total) by mouth daily.   aspirin 81 MG tablet Take 81 mg by mouth daily.   Budeson-Glycopyrrol-Formoterol (BREZTRI AEROSPHERE) 160-9-4.8 MCG/ACT AERO Inhale 2 puffs into the lungs 2 (two) times daily as needed (for flares).   Cholecalciferol (VITAMIN D3 SUPER STRENGTH) 50 MCG (2000 UT) CAPS Take 2,000 Units by mouth daily after lunch.   dorzolamide-timolol (COSOPT) 22.3-6.8 MG/ML ophthalmic solution Place 1 drop into the left eye 2 (two) times daily.    latanoprost (XALATAN) 0.005 % ophthalmic solution Place 1 drop into the left eye at bedtime.   olmesartan (BENICAR) 20 MG tablet Take 1 tablet (20 mg total) by mouth daily.   pantoprazole (PROTONIX) 40 MG tablet TAKE 1 TABLET BY MOUTH TWICE A DAY   simvastatin (ZOCOR) 40 MG tablet Take 40 mg by mouth at bedtime.   XELPROS 0.005 % EMUL Place 1 drop  into the left eye at bedtime.   No facility-administered encounter medications on file as of 04/24/2022.    Recent Office Vitals: BP Readings from Last 3 Encounters:  04/19/22 (!) 162/62  04/17/22 (!) 144/88  03/30/22 126/70   Pulse Readings from Last 3 Encounters:  04/19/22 70  04/17/22 68  03/30/22 66    Wt Readings from Last 3 Encounters:  04/19/22 111 lb (50.3 kg)   04/17/22 113 lb (51.3 kg)  03/30/22 113 lb 9.6 oz (51.5 kg)     Kidney Function Lab Results  Component Value Date/Time   CREATININE 0.97 12/08/2021 03:55 PM   CREATININE 0.89 10/07/2021 11:35 AM   GFR 60.94 01/27/2020 04:11 PM   GFRNONAA >60 01/05/2021 12:38 PM   GFRAA 64 12/22/2019 11:40 AM       Latest Ref Rng & Units 12/08/2021    3:55 PM 10/07/2021   11:35 AM 06/29/2021   10:01 AM  BMP  Glucose 70 - 99 mg/dL 93  95  98   BUN 8 - 27 mg/dL 17  18  17    Creatinine 0.57 - 1.00 mg/dL 0.97  0.89  0.84   BUN/Creat Ratio 12 - 28 18  20  20    Sodium 134 - 144 mmol/L 142  138  138   Potassium 3.5 - 5.2 mmol/L 5.1  4.5  4.8   Chloride 96 - 106 mmol/L 106  102  103   CO2 20 - 29 mmol/L 24  21  23    Calcium 8.7 - 10.3 mg/dL 10.5  10.0  10.3    Pt has questions about medications and possible interactions so I scheduled a f/u with CPP on 05/09/22 @ 3:00pm.   Elray Mcgregor, [place] Pharmacist Assistant  [phone]

## 2022-05-01 ENCOUNTER — Ambulatory Visit (INDEPENDENT_AMBULATORY_CARE_PROVIDER_SITE_OTHER): Payer: Medicare Other | Admitting: *Deleted

## 2022-05-01 VITALS — BP 190/74 | HR 66 | Wt 112.4 lb

## 2022-05-01 DIAGNOSIS — I10 Essential (primary) hypertension: Secondary | ICD-10-CM | POA: Diagnosis not present

## 2022-05-01 MED ORDER — HYDRALAZINE HCL 10 MG PO TABS: ORAL_TABLET | ORAL | 3 refills | Status: DC

## 2022-05-01 NOTE — Progress Notes (Signed)
   Nurse Visit   Date of Encounter: 05/01/2022 ID: BLAYKLEE MABLE, DOB 1930/04/16, MRN [id number]  PCP:  Rochel Brome, MD   [facility] Providers Cardiologist:  Sanda Klein, MD      Visit Details   VS:  BP (!) 190/74   Pulse 66   Wt 112 lb 6.4 oz (51 kg)   BMI 19.29 kg/m  , BMI Body mass index is 19.29 kg/m.  Wt Readings from Last 3 Encounters:  05/01/22 112 lb 6.4 oz (51 kg)  04/19/22 111 lb (50.3 kg)  04/17/22 113 lb (51.3 kg)     Reason for visit: Blood pressure check  Performed today: Vitals, Provider consulted:DR CHRISTOPHER, and Education Changes (medications, testing, etc.) : Start Hydralazine 10 mg as needed for blood pressure 170 or greater. Ok to take 2 tablets prior to dental appointment.   Length of Visit: GREATER THAN 60 minutes  Patient had multiple complaints including overall generally not feeling well, no energy. Her Levothyroxine was recently increased from 100 mcg to 112 mcg however she has not made the change. When SBP running in the 150's has headache, 103 she feels poorly. Patient stated she felt unsteady on her feet at times and her head feels full. Blood pressure log reviewed, average blood pressure last 8 readings with new machine 136/70 With new and old machine twice a day dating back to 3/3 SBP average 136. Discussed with Dr Harrell Gave and will start Hydralazine 10 mg as needed for SBP 170 or above, take 2 tablets prior to dental procedure.     Medications Adjustments/Labs and Tests Ordered: No orders of the defined types were placed in this encounter.  Meds ordered this encounter  Medications   hydrALAZINE (APRESOLINE) 10 MG tablet    Sig: Take 1 tablet as needed for blood pressure 170 or above    Dispense:  20 tablet    Refill:  3     Signed, Alvina Filbert, LPN  7/56/4332 9:51 PM

## 2022-05-01 NOTE — Patient Instructions (Addendum)
Dr Harrell Gave recommends taking Hydralazine 10 mg 2 tablets prior to your dental appointment and ok to take 1 tablet at home for blood pressure above 170. Otherwise continue current medications. Continue to monitor your blood pressure daily and call in with updated readings.

## 2022-05-02 ENCOUNTER — Telehealth (HOSPITAL_BASED_OUTPATIENT_CLINIC_OR_DEPARTMENT_OTHER): Payer: Self-pay | Admitting: *Deleted

## 2022-05-02 NOTE — Telephone Encounter (Signed)
Spoke with patient regarding Blood pressures.  Non symptomatic.  She is currently taking Olmesartan 20 mg daily (started after 2/28 visit). Appt. 09/13/22. BP readings listed.  Patient takes her medication daily between 3-4pm  March 8th  9:30 am  134/51   HR 72   5:00 pm  132/67 HR 67 March 9th 11:00 am 106/51   HR 81   5:00 pm  154/79  HR 71 March 10th  9:30am  146/64  HR 71  6:15 pm  159/89  HR 68 March 11th  9:00am  150/75  HR 69  6:15 pm  125/58  HR 71 March 12th  9:20am  155/83   HR 71

## 2022-05-02 NOTE — Telephone Encounter (Signed)
Called to inform patient of her appointment with Dr. Ethelle Lyon 09/13/22 at 3:00pm at the [facility] location.  Patient states her BP reading this morning was 155/83 and her heart rate was 70.  She normally does not take her blood pressure medication until the afternoon.  Informed patient I have placed her on the cancellation listing for Dr. Sallyanne Kuster and I will mail the current appointment information to her.  She voiced her understanding.

## 2022-05-02 NOTE — Telephone Encounter (Signed)
NL pt of Dr. Loletha Grayer

## 2022-05-03 NOTE — Telephone Encounter (Signed)
The majority of those readings are well within target range (<140/90 due to her age and labile BP). I would even tolerate SBP up to 150 occasionally. She is much more likely to be harmed by a sudden drop in BP than by elevated BP. OK to take hydralazine 10 mg for SBP>150. Can take as much as once every 8 hours. I do not recommend taking the hydralazine on days when BP is lower than that.

## 2022-05-03 NOTE — Telephone Encounter (Signed)
While that last blood pressure reading and a few of the others are a little high, many of her blood pressure readings are well within target range.  Some variation is expected.  Please make sure that she is fully relaxed and sitting down for at least 10 minutes before she records her blood pressure.  For the time being, I do not think we should make any changes in her medication.

## 2022-05-03 NOTE — Telephone Encounter (Signed)
Spoke with patient with recommendations per Dr. Sallyanne Kuster.    Croitoru, Mihai, MD  You25 minutes ago (9:51 AM)   While that last blood pressure reading and a few of the others are a little high, many of her blood pressure readings are well within target range.  Some variation is expected.  Please make sure that she is fully relaxed and sitting down for at least 10 minutes before she records her blood pressure.  For the time being, I do not think we should make any changes in her medication.    Patient states understanding and will keep a log and if still high readings and concerns, will call back next week.

## 2022-05-05 ENCOUNTER — Telehealth: Payer: Self-pay

## 2022-05-05 NOTE — Progress Notes (Signed)
Care Management & Coordination Services Pharmacy Team  Reason for Encounter: Appointment Reminder  Contacted patient to confirm telephone appointment with Arizona Constable, PharmD on 05/09/22 at 3:00 pm.  Spoke with patient on 05/05/2022   Do you have any problems getting your medications? No  What is your top health concern you would like to discuss at your upcoming visit? Wants to go over medications and possible interactions. Pt stated she does have trouble seeing the medication names on her bottles, just fyi.   Have you seen any other providers since your last visit with PCP? Yes, Cardiologist   Chart review:  Recent office visits:  None  Recent consult visits:  None  Hospital visits:  None   Star Rating Drugs:  Medication:  Last Fill: Day Supply Olmesartan   04/17/22    90ds Simvastatin   02/20/22-12/03/21 90ds  Care Gaps: Annual wellness visit in last year? Yes  If Diabetic:N/A Last eye exam / retinopathy screening: Last diabetic foot exam:   Elray Mcgregor, [place] Pharmacist Assistant  [phone]

## 2022-05-09 ENCOUNTER — Ambulatory Visit: Payer: Medicare Other

## 2022-05-09 NOTE — Patient Outreach (Signed)
Care Management & Coordination Services Pharmacy Note  05/09/2022 Name:  Leah Pittman MRN:  [id number] DOB:  Apr 17, 1930   Recommendations/Changes made from today's visit: -Patient states she is having hypotensive symptoms. She was on Losartan and her BP was going into the 200's. She saw Cardio and was changed to Olmesartan. Her BP is high in the AM and low in PM, this is most likely due to her taking med around 3PM daily. She states she is having orthostasis when it drops too low. Will reach out to Cardio -Having trouble with her throat. Saw ENT last year but she didn't enjoy the experience. Said he didn't spend that much time with her and would like a new referral.  -States she has mucus build up in throat that "Chokes" her  -Can't eat anything sour or it'll "close her throat up" immediately    Subjective: Leah Pittman is an 89 y.o. year old female who is a primary patient of [name], [name], MD.  The care coordination team was consulted for assistance with disease management and care coordination needs.    Engaged with patient by telephone for follow up visit.  Recent office visits:  None   Recent consult visits:  None   Hospital visits:  None   Objective:  Lab Results  Component Value Date   CREATININE 0.82 04/19/2022   BUN 20 04/19/2022   GFR 60.94 01/27/2020   EGFR 68 04/19/2022   GFRNONAA >60 01/05/2021   GFRAA 64 12/22/2019   NA 140 04/19/2022   K 5.0 04/19/2022   CALCIUM 10.5 (H) 04/19/2022   CO2 22 04/19/2022   GLUCOSE 84 04/19/2022    Lab Results  Component Value Date/Time   HGBA1C 5.9 (H) 12/08/2021 03:55 PM   HGBA1C 5.8 (H) 07/10/2018 04:21 AM   GFR 60.94 01/27/2020 04:11 PM    Last diabetic Eye exam: No results found for: "HMDIABEYEEXA"  Last diabetic Foot exam: No results found for: "HMDIABFOOTEX"   Lab Results  Component Value Date   CHOL 180 12/08/2021   HDL 76 12/08/2021   LDLCALC 90 12/08/2021   TRIG 75 12/08/2021   CHOLHDL 2.4  12/08/2021       Latest Ref Rng & Units 04/19/2022    2:27 PM 12/08/2021    3:55 PM 10/07/2021   11:35 AM  Hepatic Function  Total Protein 6.0 - 8.5 g/dL 6.7  6.9  6.2   Albumin 3.7 - 4.7 g/dL 4.6  4.6  4.2   AST 0 - 40 IU/L 13  17  17    ALT 0 - 32 IU/L 12  16  13    Alk Phosphatase 44 - 121 IU/L 76  68  73   Total Bilirubin 0.0 - 1.2 mg/dL 0.3  0.4  0.4     Lab Results  Component Value Date/Time   TSH 0.483 04/19/2022 02:27 PM   TSH 0.984 10/07/2021 11:35 AM   FREET4 1.58 04/19/2022 02:27 PM   FREET4 1.63 10/07/2021 11:35 AM       Latest Ref Rng & Units 04/19/2022    2:27 PM 12/08/2021    3:55 PM 10/07/2021   11:35 AM  CBC  WBC 3.4 - 10.8 x10E3/uL 8.3  9.1  8.6   Hemoglobin 11.1 - 15.9 g/dL 12.1  12.1  12.1   Hematocrit 34.0 - 46.6 % 36.8  36.5  36.3   Platelets 150 - 450 x10E3/uL 245  231  268     Lab Results  Component Value  Date/Time   VD25OH 43.0 06/29/2021 10:01 AM   VD25OH 37.4 10/18/2020 02:33 PM   [id number] 04/19/2022 02:27 PM   VITAMINB12 386 06/29/2021 10:01 AM    Clinical ASCVD: No  The ASCVD Risk score (Arnett DK, et al., 2019) failed to calculate for the following reasons:   The 2019 ASCVD risk score is only valid for ages 16 to 49   The patient has a prior MI or stroke diagnosis    Other: (CHADS2VASc if Afib, MMRC or CAT for COPD, ACT, DEXA)     04/19/2022    1:34 PM 06/29/2021    9:28 AM 05/11/2021    4:28 PM  Depression screen PHQ 2/9  Decreased Interest 0 0 0  Down, Depressed, Hopeless 0 0 0  PHQ - 2 Score 0 0 0     Social History   Tobacco Use  Smoking Status Never  Smokeless Tobacco Never   BP Readings from Last 3 Encounters:  05/01/22 (!) 190/74  04/19/22 (!) 162/62  04/17/22 (!) 144/88   Pulse Readings from Last 3 Encounters:  05/01/22 66  04/19/22 70  04/17/22 68   Wt Readings from Last 3 Encounters:  05/01/22 112 lb 6.4 oz (51 kg)  04/19/22 111 lb (50.3 kg)  04/17/22 113 lb (51.3 kg)   BMI Readings from Last 3  Encounters:  05/01/22 19.29 kg/m  04/19/22 19.05 kg/m  04/17/22 19.40 kg/m    Allergies  Allergen Reactions   Benadryl [Diphenhydramine] Swelling and Other (See Comments)    Tongue swells and hallucinates- could not talk   Lipase Concentrate-Hp [Digestive Enzymes] Rash   Zenpep [Pancrelipase (Lip-Prot-Amyl)] Rash   Codeine Nausea And Vomiting and Hypertension   Meperidine Nausea Only, Swelling and Other (See Comments)    Tongue swells and "I feel like death"   Other Other (See Comments)    SKIN IS VERY SENSITIVE AND IT TEARS EASILY!!   Prednisone Hypertension   Promethazine Other (See Comments)    Hypotension    Propranolol Nausea And Vomiting   Shellfish Allergy Nausea And Vomiting   Tape Other (See Comments)    Tears the skin   Tazarotene Other (See Comments)    Reaction not recalled- "Tazarotene, commonly marketed as Tazorac, Avage, and Zorac, is member of the acetylenic class of retinoids."    Iodinated Contrast Media Nausea Only and Rash    Medications Reviewed Today     Reviewed by Rochel Brome, MD (Physician) on 04/20/22 at [facility] List Status: <None>   Medication Order Taking? Sig Documenting Provider Last Dose Status Informant  aspirin 81 MG tablet [id number] No Take 81 mg by mouth daily. [provider] Taking Active Self  Budeson-Glycopyrrol-Formoterol (BREZTRI AEROSPHERE) 160-9-4.8 MCG/ACT AERO [id number] No Inhale 2 puffs into the lungs 2 (two) times daily as needed (for flares). [name], [name], MD Taking Active   Cholecalciferol (VITAMIN D3 SUPER STRENGTH) 50 MCG (2000 UT) CAPS [id number] No Take 2,000 Units by mouth daily after lunch. [provider] Taking Active Self  dorzolamide-timolol (COSOPT) 22.3-6.8 MG/ML ophthalmic solution [id number] No Place 1 drop into the left eye 2 (two) times daily.  [provider] Taking Active Self  latanoprost (XALATAN) 0.005 % ophthalmic solution [id number] No Place 1 drop into the left eye at  bedtime. [provider] Taking Active   levothyroxine (SYNTHROID) 100 MCG tablet [id number] No Take 1 tablet (100 mcg total) by mouth daily before breakfast. [name], [name], MD Taking Active   olmesartan (BENICAR) 20 MG  tablet [id number]  Take 1 tablet (20 mg total) by mouth daily. Loel Dubonnet, NP  Active   pantoprazole (PROTONIX) 40 MG tablet [id number] No TAKE 1 TABLET BY MOUTH TWICE A DAY [name], [name], MD Taking Active   simvastatin (ZOCOR) 40 MG tablet [id number] No Take 40 mg by mouth at bedtime. [provider] Taking Active   XELPROS 0.005 % EMUL [id number] No Place 1 drop into the left eye at bedtime. [provider] Taking Active Self            SDOH:  (Social Determinants of Health) assessments and interventions performed: Yes SDOH Interventions    Flowsheet Row Office Visit from 04/19/2022 in [place] Telephone from 10/19/2021 in [place] Coordination Clinical Support from 05/11/2021 in [place]  SDOH Interventions     Food Insecurity Interventions -- Intervention Not Indicated Intervention Not Indicated  Housing Interventions -- Intervention Not Indicated Intervention Not Indicated  Transportation Interventions -- Intervention Not Indicated Intervention Not Indicated  Utilities Interventions Intervention Not Indicated -- --  Financial Strain Interventions -- Intervention Not Indicated --  Physical Activity Interventions -- -- Other (Comments)  [discussed chair exercises]  Social Connections Interventions Intervention Not Indicated -- --       Medication Assistance: None required.  Patient affirms current coverage meets needs.    Name and location of Current pharmacy:  [id number] - [address], [place] [address] Phone: [phone] Fax: [phone]   Compliance/Adherence/Medication fill history: Star Rating  Drugs:  Medication:                Last Fill:         Day Supply Olmesartan                 04/17/22               90ds Simvastatin                 02/20/22-12/03/21 90ds   Care Gaps: Annual wellness visit in last year? Yes   If Diabetic:N/A Last eye exam / retinopathy screening: Last diabetic foot exam:   Assessment/Plan   Hypertension (BP goal <140/90) BP Readings from Last 3 Encounters:  05/01/22 (!) 190/74  04/19/22 (!) 162/62  04/17/22 (!) 144/88    Pulse Readings from Last 3 Encounters:  05/01/22 66  04/19/22 70  04/17/22 68  -Not-Controlled -Current treatment: Olmesartan 20mg  Appropriate, Effective, Query Safe,  Hydralazine 10mg  PRN [id number] Systolic Appropriate, Effective, Safe, Accessible -Medications previously tried: carvedilol, clonidine, edarbi, lisinopril, metoprolol, bystolic, Losartan -Current home readings:  March 2024: Takes Olmesartan at 3PM daily 05/03/22:  AM: 168/83 HR 70 Noon: 128/66 HR 60 PM: 128/56 HR 70 05/04/22: AM: 143/74 HR 74 PM: 101/50 HR 74 05/05/22: AM: 121/57 HR 75 PM: 105/48 HR 70 05/06/22:  AM: 156/77 HR 72 PM: 90/59 HR 65 PM: 125/41 HR 63 05/07/22: AM: 143/77 HR 70 PM 123/61 HR 72 05/08/22: AM: 132/68 HR 69 PM: 137/69 HR 66 05/09/22:  149/80 HR 71 -Current dietary habits: eatings vegetables and fruit -Current exercise habits: stays active but too weak to walk like she did previously -Educated on BP goals and benefits of medications for prevention of heart attack, stroke and kidney damage; Daily salt intake goal < 2300 mg; Exercise goal of 150 minutes per week; -Counseled to monitor BP at home daily, document, and provide log at  future appointments -Counseled on diet and exercise extensively Recommended to continue current medication November 2022: Updated Care Plan from BP readings March 2024: Patient states she is having hypotensive symptoms. She was on Losartan and her BP was going into the 200's. She saw Cardio and was changed  to Olmesartan. Her BP is high in the AM and low in PM, this is most likely due to her taking med around 3PM daily. She states she is having orthostasis when it drops too low. Will reach out to Cardio  Thyroid (Goal TSH: 0.4-4.5 Lab Results  Component Value Date   TSH 0.483 04/19/2022  -Is patient taking Biotin supplement No  -Controlled -Current treatment: Levothyroxine 112 mcg Appropriate, Effective, Safe, Accessible -Counseled to take medication on an empty stomach -Recommended to continue current medication    Hyperlipidemia: (LDL goal < 70) The ASCVD Risk score (Arnett DK, et al., 2019) failed to calculate for the following reasons:   The 2019 ASCVD risk score is only valid for ages 53 to 27   The patient has a prior MI or stroke diagnosis Lab Results  Component Value Date   CHOL 180 12/08/2021   CHOL 173 10/07/2021   CHOL 188 06/29/2021   Lab Results  Component Value Date   HDL 76 12/08/2021   HDL 68 10/07/2021   HDL 64 06/29/2021   Lab Results  Component Value Date   LDLCALC 90 12/08/2021   LDLCALC 87 10/07/2021   LDLCALC 105 (H) 06/29/2021   Lab Results  Component Value Date   TRIG 75 12/08/2021   TRIG 98 10/07/2021   TRIG 105 06/29/2021   Lab Results  Component Value Date   CHOLHDL 2.4 12/08/2021   CHOLHDL 2.5 10/07/2021   CHOLHDL 2.9 06/29/2021   No results found for: "LDLDIRECT" Last vitamin D Lab Results  Component Value Date   VD25OH 43.0 06/29/2021   Lab Results  Component Value Date   TSH 0.483 04/19/2022   -uncontrolled -Current treatment: Simvastatin 20 mg daily bedtime Appropriate, Effective, Safe, Accessible -Medications previously tried: Rosuvastatin -Current dietary patterns: vegetables and fruit - can't tolerate meat well -Current exercise habits: stays active but not walking like before -Educated on Cholesterol goals;  Benefits of statin for ASCVD risk reduction; Importance of limiting foods high in cholesterol; Exercise goal of  150 minutes per week; -Counseled on diet and exercise extensively March 2024: Patient fill Hx states Simvastatin and Rosuvastatin. Patient assures me they are only taking Simvastatin    CPP f/U 6 months  [place] Constable, Pharm.D. - [phone]

## 2022-05-12 ENCOUNTER — Ambulatory Visit: Payer: Medicare Other

## 2022-05-12 VITALS — BP 190/90 | HR 80

## 2022-05-12 DIAGNOSIS — I119 Hypertensive heart disease without heart failure: Secondary | ICD-10-CM

## 2022-05-12 NOTE — Patient Instructions (Signed)
Hydralazine 10 mg three times daily.  Increase Olmesartan to 400 mg daily.  Check bp twice daily and record.  Recheck in the office in 2 weeks.

## 2022-05-12 NOTE — Progress Notes (Signed)
Leah Pittman comes in for follow up after leaving [facility] ED.  Her bp upon arrival to the office was 190/90, pulse 90.  She is very anxious but she denies chest pain.  Dr. Tobie Poet was made aware of her bp readings and she reviewed [facility] ED note.

## 2022-05-14 ENCOUNTER — Other Ambulatory Visit: Payer: Self-pay | Admitting: Family Medicine

## 2022-05-19 ENCOUNTER — Telehealth: Payer: Self-pay

## 2022-05-19 NOTE — Patient Outreach (Signed)
05/19/22: 163/72 AM 05/18/22: 126/? AM,  05/17/22: 123/? @5AM ,  05/16/22: 121/? 05/15/22: 113/? 05/14/22: 105/?  Patient very confused and dizzy on Hydralazine. They are unable to find the bottom numbers but top number looks great  BP is still high  Olmesartan 40mg  PM (Dosing Per Cardio to prevent orthostasis and 40mg /day is per note from [name]'s office)  Hydralazine 10mg  TID (Taking is TID, not PRN) -Counseled to take only prn if BP above [id number] systolic per SIG

## 2022-05-19 NOTE — Addendum Note (Signed)
Addended by: Lane Hacker on: 05/19/2022 10:19 AM   Modules accepted: Orders

## 2022-05-19 NOTE — Progress Notes (Cosign Needed)
05-19-2022: Patient called in with blood pressure readings per [place] Constable request. 03-29 9am 163/79 63, 03-28 6:30pm 137/69 69 1:30pm 142/68 72, 03-27 6pm 140/69 70  6:40am 125/66 68, 03-26 5pm 116/58 65, 03-25 6pm 102/53 68. [place] Constable informed.   [place] Pharmacist Assistant [phone]

## 2022-05-24 ENCOUNTER — Ambulatory Visit (INDEPENDENT_AMBULATORY_CARE_PROVIDER_SITE_OTHER): Payer: Medicare Other | Admitting: Family Medicine

## 2022-05-24 ENCOUNTER — Encounter: Payer: Self-pay | Admitting: Family Medicine

## 2022-05-24 VITALS — BP 160/60 | HR 69 | Temp 97.9°F | Resp 14 | Ht 64.0 in | Wt 111.0 lb

## 2022-05-24 DIAGNOSIS — I119 Hypertensive heart disease without heart failure: Secondary | ICD-10-CM

## 2022-05-24 DIAGNOSIS — R1013 Epigastric pain: Secondary | ICD-10-CM | POA: Diagnosis not present

## 2022-05-24 MED ORDER — VOQUEZNA 20 MG PO TABS: 20.0000 mg | ORAL_TABLET | Freq: Every day | ORAL | 0 refills | Status: DC

## 2022-05-24 NOTE — Patient Instructions (Addendum)
Continue olmesartan 20 mg twice daily.  Remain off hydralazine.  REFER TO HYPERTENSION CLINIC.   Recommend start on VOQUEZNA 20 MG ONCE DAILY AT LUNCHTIME STOP PANTOPRAZOLE . [facility] [phone]. Scheduled 08/08/2022 at 11:20am

## 2022-05-24 NOTE — Progress Notes (Unsigned)
Subjective:  Patient ID: Leah Pittman, female    DOB: 1930/06/15  Age: 89 y.o. MRN: [id number]  Chief Complaint  Patient presents with   Hypertension    HPI Patient was seen on 04/19/2022 and Dr Sedalia Muta recommended start olmesartan 20 mg daily and stop losartan as instructed by cardiology. ONLY check bps twice daily Her blood pressure at home systolic range is 99 -215 and diastolic 52 to 86. Dr Sedalia Muta recommended to take 2 tablets of Olmesartan 20 mg daily. She mentioned she has not taking hydralazine since 5 days ago.     04/19/2022    1:34 PM 06/29/2021    9:28 AM 05/11/2021    4:28 PM 09/21/2020   10:36 AM 07/22/2020   10:24 AM  Depression screen PHQ 2/9  Decreased Interest 0 0 0 0 0  Down, Depressed, Hopeless 0 0 0 0 0  PHQ - 2 Score 0 0 0 0 0         11/02/2020    6:30 PM 01/05/2021   12:05 PM 05/11/2021    4:30 PM 06/29/2021    9:28 AM 04/19/2022    1:34 PM  Fall Risk  Falls in the past year?   1 0 0  Was there an injury with Fall?   0 0 0  Fall Risk Category Calculator   1 0 0  Fall Risk Category (Retired)   Low Low   (RETIRED) Patient Fall Risk Level Low fall risk Low fall risk Moderate fall risk    Patient at Risk for Falls Due to   Impaired balance/gait;Impaired vision  No Fall Risks  Fall risk Follow up   Falls evaluation completed;Education provided;Falls prevention discussed Falls evaluation completed Falls evaluation completed      Review of Systems  Constitutional:  Negative for chills, fatigue and fever.  HENT:  Negative for congestion, ear pain and sore throat.   Respiratory:  Negative for cough and shortness of breath.   Cardiovascular:  Negative for chest pain and palpitations.  Gastrointestinal:  Negative for abdominal pain, constipation, diarrhea, nausea and vomiting.  Endocrine: Negative for polydipsia, polyphagia and polyuria.  Genitourinary:  Negative for difficulty urinating and dysuria.  Musculoskeletal:  Negative for arthralgias, back pain and  myalgias.  Skin:  Negative for rash.  Neurological:  Negative for headaches.  Psychiatric/Behavioral:  Negative for dysphoric mood. The patient is not nervous/anxious.     Current Outpatient Medications on File Prior to Visit  Medication Sig Dispense Refill   aspirin 81 MG tablet Take 81 mg by mouth daily.     Budeson-Glycopyrrol-Formoterol (BREZTRI AEROSPHERE) 160-9-4.8 MCG/ACT AERO Inhale 2 puffs into the lungs 2 (two) times daily as needed (for flares). 10.7 g 11   Cholecalciferol (VITAMIN D3 SUPER STRENGTH) 50 MCG (2000 UT) CAPS Take 2,000 Units by mouth daily after lunch.     dorzolamide-timolol (COSOPT) 22.3-6.8 MG/ML ophthalmic solution Place 1 drop into the left eye 2 (two) times daily.      latanoprost (XALATAN) 0.005 % ophthalmic solution Place 1 drop into the left eye at bedtime.     levothyroxine (SYNTHROID) 112 MCG tablet Take 1 tablet (112 mcg total) by mouth daily. 90 tablet 1   simvastatin (ZOCOR) 40 MG tablet Take 40 mg by mouth at bedtime.     XELPROS 0.005 % EMUL Place 1 drop into the left eye at bedtime.     hydrALAZINE (APRESOLINE) 10 MG tablet Take 1 tablet as needed for blood pressure 170 or above (  Patient not taking: Reported on 05/24/2022) 20 tablet 3   olmesartan (BENICAR) 40 MG tablet Take 40 mg by mouth at bedtime.     No current facility-administered medications on file prior to visit.   Past Medical History:  Diagnosis Date   Anxiety    Arthritis    Chest pain 2011   CARDIOLITE - no significant symptoms, EKG changes, or arrhythmias   Congenital prolapse of bladder mucosa    Diverticulosis    Dyspnea 02/16/2012   2D ECHO - EF 55-60%, normal   Esophageal dysphagia    Fatigue    GERD (gastroesophageal reflux disease)    Glaucoma    Headache(784.0)    Heart attack    Hiatal hernia    High blood pressure 02/16/2012   RENAL DOPPLER - normal   Hyperlipidemia    Hypothyroidism    Labile hypertension    Lightheadedness    Migraine headache    Multiple  allergies    Myalgia    NSTEMI (non-ST elevated myocardial infarction) 07/14/2020   OSA (obstructive sleep apnea)    Pain, lower leg    Calf   Problem of menstruation    Proteinuria    Reflux    SOB (shortness of breath)    Thyroid disease    Urinary problem    Valvular heart disease    Vitamin B12 deficiency    Vitamin D deficiency    Past Surgical History:  Procedure Laterality Date   ABDOMINAL HYSTERECTOMY  1976   CHOLECYSTECTOMY  2000   COLONOSCOPY  07/27/2014   Moderate predominantly sigmoid divertuculosis. Otherwise normal colonoscopy   CORONARY ARTERY BYPASS GRAFT N/A 07/10/2018   Procedure: CORONARY ARTERY BYPASS GRAFTING (CABG), FREE LIMA;  Surgeon: [name], MD;  Location: [facility] OR;  Service: Open Heart Surgery;  Laterality: N/A;   CYST REMOVAL NECK     CYSTECTOMY  1974   Intestine   CYSTECTOMY  1996   Brain stem   ESOPHAGOGASTRODUODENOSCOPY  12/07/2016   Schatzki's ring status post esophageal dilatation. Small hiatal hernia. Mild gastritis   EYE SURGERY  2003   EYE SURGERY  07/2016   LEFT HEART CATH AND CORONARY ANGIOGRAPHY N/A 07/08/2018   Procedure: LEFT HEART CATH AND CORONARY ANGIOGRAPHY;  Surgeon: Runell Gess, MD;  Location: [facility] INVASIVE CV LAB;  Service: Cardiovascular;  Laterality: N/A;   LEFT HEART CATH AND CORONARY ANGIOGRAPHY N/A 07/25/2019   Procedure: LEFT HEART CATH AND CORONARY ANGIOGRAPHY;  Surgeon: Kathleene Hazel, MD;  Location: [facility] INVASIVE CV LAB;  Service: Cardiovascular;  Laterality: N/A;   MOUTH SURGERY  2013   RADIOLOGY WITH ANESTHESIA N/A 11/05/2019   Procedure: MRI WITH ANESTHESIA;  Surgeon: Radiologist, Medication, MD;  Location: [facility] OR;  Service: Radiology;  Laterality: N/A;   TEE WITHOUT CARDIOVERSION N/A 07/10/2018   Procedure: TRANSESOPHAGEAL ECHOCARDIOGRAM (TEE);  Surgeon: [name], MD;  Location: [facility] OR;  Service: Open Heart Surgery;  Laterality: N/A;   TONSILLECTOMY     age 89    Family History  Problem Relation  Age of Onset   Other Mother        blood disorder - unsure of name   Heart attack Father    Stroke Father    Cancer Sister    Cancer Brother    Asthma Brother    Cancer Brother    Social History   Socioeconomic History   Marital status: Married    Spouse name: Not on file   Number of  children: 3   Years of education: college   Highest education level: Not on file  Occupational History   Occupation: Retired  Tobacco Use   Smoking status: Never   Smokeless tobacco: Never  Vaping Use   Vaping Use: Never used  Substance and Sexual Activity   Alcohol use: No   Drug use: No   Sexual activity: Not on file  Other Topics Concern   Not on file  Social History Narrative   Lives with husband in [place], [place].   Right-handed.   No daily caffeine use.   Social Determinants of Health   Financial Resource Strain: Medium Risk (10/19/2021)   Overall Financial Resource Strain (CARDIA)    Difficulty of Paying Living Expenses: Somewhat hard  Food Insecurity: No Food Insecurity (10/19/2021)   Hunger Vital Sign    Worried About Running Out of Food in the Last Year: Never true    Ran Out of Food in the Last Year: Never true  Transportation Needs: No Transportation Needs (05/09/2022)   PRAPARE - Administrator, Civil Service (Medical): No    Lack of Transportation (Non-Medical): No  Physical Activity: Inactive (05/09/2022)   Exercise Vital Sign    Days of Exercise per Week: 0 days    Minutes of Exercise per Session: 0 min  Stress: No Stress Concern Present (05/11/2021)   [employer] of Occupational Health - Occupational Stress Questionnaire    Feeling of Stress : Only a little  Social Connections: Moderately Integrated (04/19/2022)   Social Connection and Isolation Panel [NHANES]    Frequency of Communication with Friends and Family: Once a week    Frequency of Social Gatherings with Friends and Family: Twice a week    Attends Religious Services: More than 4 times per  year    Active Member of [employer] or Organizations: No    Attends Engineer, structural: Never    Marital Status: Married    Objective:  BP (!) 160/60   Pulse 69   Temp 97.9 F (36.6 C)   Resp 14   Ht 5\' 4"  (1.626 m)   Wt 111 lb (50.3 kg)   SpO2 96%   BMI 19.05 kg/m      05/24/2022    1:25 PM 05/12/2022   11:30 AM 05/01/2022    1:55 PM  BP/Weight  Systolic BP 160 190 190  Diastolic BP 60 90 74  Wt. (Lbs) 111  112.4  BMI 19.05 kg/m2  19.29 kg/m2    Physical Exam Vitals reviewed.  Constitutional:      Appearance: Normal appearance.  Neck:     Vascular: No carotid bruit.  Cardiovascular:     Rate and Rhythm: Normal rate and regular rhythm.     Pulses: Normal pulses.     Heart sounds: Normal heart sounds.  Pulmonary:     Effort: Pulmonary effort is normal.     Breath sounds: Normal breath sounds.  Abdominal:     General: Bowel sounds are normal.     Palpations: Abdomen is soft.     Tenderness: There is no abdominal tenderness.  Neurological:     Mental Status: She is alert and oriented to person, place, and time.  Psychiatric:        Mood and Affect: Mood normal.        Behavior: Behavior normal.     Diabetic Foot Exam - Simple   No data filed      Lab Results  Component Value  Date   WBC 8.3 04/19/2022   HGB 12.1 04/19/2022   HCT 36.8 04/19/2022   PLT 245 04/19/2022   GLUCOSE 84 04/19/2022   CHOL 180 12/08/2021   TRIG 75 12/08/2021   HDL 76 12/08/2021   LDLCALC 90 12/08/2021   ALT 12 04/19/2022   AST 13 04/19/2022   NA 140 04/19/2022   K 5.0 04/19/2022   CL 102 04/19/2022   CREATININE 0.82 04/19/2022   BUN 20 04/19/2022   CO2 22 04/19/2022   TSH 0.483 04/19/2022   INR 0.9 07/14/2020   HGBA1C 5.9 (H) 12/08/2021      Assessment & Plan:    Hypertension with heart disease Assessment & Plan: Continue olmesartan 20 mg twice daily.  Remain off hydralazine.  REFER TO HYPERTENSION CLINIC.    Hypertensive arteriosclerotic cardiovascular  disease Assessment & Plan: Continue olmesartan 20 mg twice daily.  Remain off hydralazine.  REFER TO HYPERTENSION CLINIC.   Orders: -     Ambulatory referral to Cardiology  Epigastric abdominal pain Assessment & Plan: Recommend start on VOQUEZNA 20 MG ONCE DAILY AT LUNCHTIME STOP PANTOPRAZOLE .  Orders: -     Voquezna; Take 20 mg by mouth daily.  Dispense: 20 tablet; Refill: 0     Meds ordered this encounter  Medications   Vonoprazan Fumarate (VOQUEZNA) 20 MG TABS    Sig: Take 20 mg by mouth daily.    Dispense:  20 tablet    Refill:  0    Orders Placed This Encounter  Procedures   Ambulatory referral to Cardiology     Follow-up: No follow-ups on file.   I,Marla I Leal-Borjas,acting as a scribe for Blane Ohara, MD.,have documented all relevant documentation on the behalf of Blane Ohara, MD,as directed by  Blane Ohara, MD while in the presence of Blane Ohara, MD.   An After Visit Summary was printed and given to the patient.  Blane Ohara, MD [facility] [phone]

## 2022-05-27 DIAGNOSIS — R1013 Epigastric pain: Secondary | ICD-10-CM | POA: Insufficient documentation

## 2022-05-27 NOTE — Assessment & Plan Note (Signed)
Continue olmesartan 20 mg twice daily.  Remain off hydralazine.  REFER TO HYPERTENSION CLINIC.

## 2022-05-27 NOTE — Assessment & Plan Note (Signed)
>>  ASSESSMENT AND PLAN FOR HYPERTENSION WITH HEART DISEASE WRITTEN ON 05/27/2022  5:36 PM BY LEAL-BORJAS, Kire Ferg I, CMA  Continue olmesartan  20 mg twice daily.  Remain off hydralazine .  REFER TO HYPERTENSION CLINIC.

## 2022-05-27 NOTE — Assessment & Plan Note (Signed)
>>  ASSESSMENT AND PLAN FOR EPIGASTRIC ABDOMINAL PAIN WRITTEN ON 05/28/2022  4:07 PM BY [name], [name], MD  Recommend start on VOQUEZNA 20 MG ONCE DAILY AT LUNCHTIME STOP PANTOPRAZOLE . [facility] [phone]. Scheduled 08/08/2022 at 11:20am

## 2022-05-27 NOTE — Assessment & Plan Note (Signed)
Recommend start on VOQUEZNA 20 MG ONCE DAILY AT LUNCHTIME STOP PANTOPRAZOLE . [facility] [phone]. Scheduled 08/08/2022 at 11:20am

## 2022-05-27 NOTE — Assessment & Plan Note (Addendum)
Continue olmesartan 20 mg twice daily.  Remain off hydralazine.  REFER TO HYPERTENSION CLINIC.  

## 2022-05-31 ENCOUNTER — Telehealth: Payer: Self-pay

## 2022-06-01 ENCOUNTER — Ambulatory Visit: Payer: Medicare Other | Admitting: Family Medicine

## 2022-06-06 ENCOUNTER — Telehealth: Payer: Self-pay

## 2022-06-06 NOTE — Progress Notes (Signed)
Care Management & Coordination Services Pharmacy Team  Reason for Encounter: Hypertension  Contacted patient to discuss hypertension disease state. Spoke with patient on 06/08/2022   Current antihypertensive regimen:  Olmesartan 40mg  daily Hydralazine HCI 10mg  prn Patient verbally confirms she is taking the above medications as directed. Yes  How often are you checking your Blood Pressure? twice daily  she checks her blood pressure in the morning and at nighttime before taking her medication.  Current home BP readings:  06/06/22 140/76 65 in AM 06/05/22  114/55 70 in AM 117/55 65 in PM 06/06/22 140/67 67 in AM 138/70 67 in PM 06/07/22 150/63 76 in AM 147/69 65 in PM 06/08/22 153/73 67  Wrist or arm cuff: Arm  Caffeine intake:1 Cup of coffee Salt intake:None  Any readings above 180/100? No  What recent interventions/DTPs have been made by any provider to improve Blood Pressure control since last CPP Visit: No recent changes.  Any recent hospitalizations or ED visits since last visit with CPP? No  What diet changes have been made to improve Blood Pressure Control?  Pt eats a lot of veggies and fruits. Limits her meat intake  What exercise is being done to improve your Blood Pressure Control?  Walks a bit when she can outside   Note: Pt wants to know if there is a better BP med that she can take to stabilize her BP better where she does not have to take two. Pt stated she isn't sleeping well at night due to her neuropathy and feels this is why her BP is higher in the mornings. Pt does not take any medication for her pain right now. She wants to take something more natural for this. Please advise.   Adherence Review: Is the patient currently on ACE/ARB medication? Yes Does the patient have >5 day gap between last estimated fill dates? No  Star Rating Drugs:  Medication:  Last Fill: Day Supply Olmesartan   04/17/22 90ds  Chart Updates: Recent office visits:  05/24/22 Blane Ohara MD. Seen for HTN. Referral to Cardiology. Started on Vonoprazan Fumarate 20mg  daily. D/C Pantoprazole Sodium 40mg .   05/12/22 Sabino Donovan RN. Seen for HTN. Increase Olmesartan to 400 mg daily. Hydralazine 10 mg three times daily.   Recent consult visits:  05/18/22 (Ophthalmology) [name], Doran Stabler MD. Seen for Glaucoma. Ordered dorzolamide-timolol, latanoprost (XALATAN) 0.005 % ophthalmic solution.   Hospital visits:  None  Medications: Outpatient Encounter Medications as of 06/06/2022  Medication Sig   aspirin 81 MG tablet Take 81 mg by mouth daily.   Budeson-Glycopyrrol-Formoterol (BREZTRI AEROSPHERE) 160-9-4.8 MCG/ACT AERO Inhale 2 puffs into the lungs 2 (two) times daily as needed (for flares).   Cholecalciferol (VITAMIN D3 SUPER STRENGTH) 50 MCG (2000 UT) CAPS Take 2,000 Units by mouth daily after lunch.   dorzolamide-timolol (COSOPT) 22.3-6.8 MG/ML ophthalmic solution Place 1 drop into the left eye 2 (two) times daily.    hydrALAZINE (APRESOLINE) 10 MG tablet Take 1 tablet as needed for blood pressure 170 or above (Patient not taking: Reported on 05/24/2022)   latanoprost (XALATAN) 0.005 % ophthalmic solution Place 1 drop into the left eye at bedtime.   levothyroxine (SYNTHROID) 112 MCG tablet Take 1 tablet (112 mcg total) by mouth daily.   olmesartan (BENICAR) 40 MG tablet Take 40 mg by mouth at bedtime.   simvastatin (ZOCOR) 40 MG tablet Take 40 mg by mouth at bedtime.   Vonoprazan Fumarate (VOQUEZNA) 20 MG TABS Take 20 mg by mouth daily.  XELPROS 0.005 % EMUL Place 1 drop into the left eye at bedtime.   No facility-administered encounter medications on file as of 06/06/2022.    Recent Office Vitals: BP Readings from Last 3 Encounters:  05/24/22 (!) 160/60  05/12/22 (!) 190/90  05/01/22 (!) 190/74   Pulse Readings from Last 3 Encounters:  05/24/22 69  05/12/22 80  05/01/22 66    Wt Readings from Last 3 Encounters:  05/24/22 111 lb (50.3 kg)  05/01/22 112 lb  6.4 oz (51 kg)  04/19/22 111 lb (50.3 kg)     Kidney Function Lab Results  Component Value Date/Time   CREATININE 0.82 04/19/2022 02:27 PM   CREATININE 0.97 12/08/2021 03:55 PM   GFR 60.94 01/27/2020 04:11 PM   GFRNONAA >60 01/05/2021 12:38 PM   GFRAA 64 12/22/2019 11:40 AM       Latest Ref Rng & Units 04/19/2022    2:27 PM 12/08/2021    3:55 PM 10/07/2021   11:35 AM  BMP  Glucose 70 - 99 mg/dL 84  93  95   BUN 8 - 27 mg/dL 20  17  18    Creatinine 0.57 - 1.00 mg/dL 1.61  0.96  0.45   BUN/Creat Ratio 12 - 28 24  18  20    Sodium 134 - 144 mmol/L 140  142  138   Potassium 3.5 - 5.2 mmol/L 5.0  5.1  4.5   Chloride 96 - 106 mmol/L 102  106  102   CO2 20 - 29 mmol/L 22  24  21    Calcium 8.7 - 10.3 mg/dL 40.9  81.1  91.4      Roxana Hires, [facility] Clinical Pharmacist Assistant  [phone]

## 2022-06-13 NOTE — Telephone Encounter (Signed)
Tried calling patient. No VM, no answer

## 2022-06-17 ENCOUNTER — Other Ambulatory Visit: Payer: Self-pay | Admitting: Family Medicine

## 2022-06-29 ENCOUNTER — Ambulatory Visit (INDEPENDENT_AMBULATORY_CARE_PROVIDER_SITE_OTHER): Payer: Medicare Other | Admitting: Family Medicine

## 2022-06-29 ENCOUNTER — Encounter: Payer: Self-pay | Admitting: Family Medicine

## 2022-06-29 VITALS — BP 160/60 | HR 72 | Temp 97.2°F | Resp 18 | Ht 64.0 in | Wt 108.0 lb

## 2022-06-29 DIAGNOSIS — E039 Hypothyroidism, unspecified: Secondary | ICD-10-CM | POA: Diagnosis not present

## 2022-06-29 DIAGNOSIS — R252 Cramp and spasm: Secondary | ICD-10-CM

## 2022-06-29 DIAGNOSIS — I119 Hypertensive heart disease without heart failure: Secondary | ICD-10-CM | POA: Diagnosis not present

## 2022-06-29 DIAGNOSIS — I25110 Atherosclerotic heart disease of native coronary artery with unstable angina pectoris: Secondary | ICD-10-CM | POA: Diagnosis not present

## 2022-06-29 NOTE — Progress Notes (Signed)
Subjective:  Patient ID: Leah Pittman, female    DOB: 08-15-1930  Age: 89 y.o. MRN: [id number]  Chief Complaint  Patient presents with   Hypertension   HPI Leah Pittman comes in for recheck of her blood pressure.  She has been checking her bp twice daily.  It has range 88/46-162/78. If bp is too low, patient will skip olmesartan 20 mg. Usually will take it in the evening. Has had a dull, constant headache x 1 month.  GI upset/abdominal pain.Took 2 weeks voquezna which did not help.      06/29/2022    2:13 PM 04/19/2022    1:34 PM 06/29/2021    9:28 AM 05/11/2021    4:28 PM 09/21/2020   10:36 AM  Depression screen PHQ 2/9  Decreased Interest 0 0 0 0 0  Down, Depressed, Hopeless 0 0 0 0 0  PHQ - 2 Score 0 0 0 0 0  Altered sleeping 2      Tired, decreased energy 3      Change in appetite 3      Feeling bad or failure about yourself  0      Trouble concentrating 0      Moving slowly or fidgety/restless 0      Suicidal thoughts 0      PHQ-9 Score 8      Difficult doing work/chores Somewhat difficult            06/29/2022    2:13 PM  Fall Risk   Falls in the past year? 0  Number falls in past yr: 0  Injury with Fall? 0  Risk for fall due to : History of fall(s)  Follow up Falls evaluation completed;Falls prevention discussed    Patient Care Team: Blane Ohara, MD as PCP - General (Family Medicine) Croitoru, Rachelle Hora, MD as PCP - Cardiology (Cardiology) Lucie Leather Alvira Philips, MD as Consulting Physician (Allergy and Immunology) Lynann Bologna, MD as Consulting Physician (Gastroenterology) Zettie Pho, [facility] (Pharmacist)   Review of Systems  Constitutional:  Positive for fatigue. Negative for chills and fever.  HENT:  Negative for congestion, rhinorrhea and sore throat.   Respiratory:  Positive for chest tightness (worse when lying down) and shortness of breath.   Cardiovascular:  Positive for chest pain.  Gastrointestinal:  Positive for abdominal pain. Negative for constipation,  diarrhea, nausea and vomiting.  Genitourinary:  Positive for frequency. Negative for dysuria and urgency.  Musculoskeletal:  Positive for back pain (burning discomfort upper back). Negative for myalgias.  Neurological:  Positive for headaches (involves right side of her head and into her right eye). Negative for dizziness, weakness and light-headedness.  Psychiatric/Behavioral:  Negative for dysphoric mood. The patient is not nervous/anxious.     Current Outpatient Medications on File Prior to Visit  Medication Sig Dispense Refill   aspirin 81 MG tablet Take 81 mg by mouth daily.     Budeson-Glycopyrrol-Formoterol (BREZTRI AEROSPHERE) 160-9-4.8 MCG/ACT AERO Inhale 2 puffs into the lungs 2 (two) times daily as needed (for flares). 10.7 g 11   Cholecalciferol (VITAMIN D3 SUPER STRENGTH) 50 MCG (2000 UT) CAPS Take 2,000 Units by mouth daily after lunch.     dorzolamide-timolol (COSOPT) 22.3-6.8 MG/ML ophthalmic solution Place 1 drop into the left eye 2 (two) times daily.      latanoprost (XALATAN) 0.005 % ophthalmic solution Place 1 drop into the left eye at bedtime.     olmesartan (BENICAR) 40 MG tablet Take 40 mg by mouth at bedtime.  simvastatin (ZOCOR) 40 MG tablet TAKE 1 TABLET BY MOUTH EVERYDAY AT BEDTIME 90 tablet 1   Vonoprazan Fumarate (VOQUEZNA) 20 MG TABS Take 20 mg by mouth daily. 20 tablet 0   XELPROS 0.005 % EMUL Place 1 drop into the left eye at bedtime.     No current facility-administered medications on file prior to visit.   Past Medical History:  Diagnosis Date   Anxiety    Arthritis    Chest pain 2011   CARDIOLITE - no significant symptoms, EKG changes, or arrhythmias   Congenital prolapse of bladder mucosa    Diverticulosis    Dyspnea 02/16/2012   2D ECHO - EF 55-60%, normal   Esophageal dysphagia    Fatigue    GERD (gastroesophageal reflux disease)    Glaucoma    Headache(784.0)    Heart attack (HCC)    Hiatal hernia    High blood pressure 02/16/2012    RENAL DOPPLER - normal   Hyperlipidemia    Hypothyroidism    Labile hypertension    Lightheadedness    Migraine headache    Multiple allergies    Myalgia    NSTEMI (non-ST elevated myocardial infarction) (HCC) 07/14/2020   OSA (obstructive sleep apnea)    Pain, lower leg    Calf   Problem of menstruation    Proteinuria    Reflux    SOB (shortness of breath)    Thyroid disease    Urinary problem    Valvular heart disease    Vitamin B12 deficiency    Vitamin D deficiency    Past Surgical History:  Procedure Laterality Date   ABDOMINAL HYSTERECTOMY  1976   CHOLECYSTECTOMY  2000   COLONOSCOPY  07/27/2014   Moderate predominantly sigmoid divertuculosis. Otherwise normal colonoscopy   CORONARY ARTERY BYPASS GRAFT N/A 07/10/2018   Procedure: CORONARY ARTERY BYPASS GRAFTING (CABG), FREE LIMA;  Surgeon: [name], MD;  Location: [facility] OR;  Service: Open Heart Surgery;  Laterality: N/A;   CYST REMOVAL NECK     CYSTECTOMY  1974   Intestine   CYSTECTOMY  1996   Brain stem   ESOPHAGOGASTRODUODENOSCOPY  12/07/2016   Schatzki's ring status post esophageal dilatation. Small hiatal hernia. Mild gastritis   EYE SURGERY  2003   EYE SURGERY  07/2016   LEFT HEART CATH AND CORONARY ANGIOGRAPHY N/A 07/08/2018   Procedure: LEFT HEART CATH AND CORONARY ANGIOGRAPHY;  Surgeon: Runell Gess, MD;  Location: [facility] INVASIVE CV LAB;  Service: Cardiovascular;  Laterality: N/A;   LEFT HEART CATH AND CORONARY ANGIOGRAPHY N/A 07/25/2019   Procedure: LEFT HEART CATH AND CORONARY ANGIOGRAPHY;  Surgeon: Kathleene Hazel, MD;  Location: [facility] INVASIVE CV LAB;  Service: Cardiovascular;  Laterality: N/A;   MOUTH SURGERY  2013   RADIOLOGY WITH ANESTHESIA N/A 11/05/2019   Procedure: MRI WITH ANESTHESIA;  Surgeon: Radiologist, Medication, MD;  Location: [facility] OR;  Service: Radiology;  Laterality: N/A;   TEE WITHOUT CARDIOVERSION N/A 07/10/2018   Procedure: TRANSESOPHAGEAL ECHOCARDIOGRAM (TEE);  Surgeon: [name], MD;  Location: [facility] OR;  Service: Open Heart Surgery;  Laterality: N/A;   TONSILLECTOMY     age 17    Family History  Problem Relation Age of Onset   Other Mother        blood disorder - unsure of name   Heart attack Father    Stroke Father    Cancer Sister    Cancer Brother    Asthma Brother    Cancer  Brother    Social History   Socioeconomic History   Marital status: Married    Spouse name: Not on file   Number of children: 3   Years of education: college   Highest education level: Not on file  Occupational History   Occupation: Retired  Tobacco Use   Smoking status: Never   Smokeless tobacco: Never  Vaping Use   Vaping Use: Never used  Substance and Sexual Activity   Alcohol use: No   Drug use: No   Sexual activity: Not on file  Other Topics Concern   Not on file  Social History Narrative   Lives with husband in [place], [place].   Right-handed.   No daily caffeine use.   Social Determinants of Health   Financial Resource Strain: Medium Risk (10/19/2021)   Overall Financial Resource Strain (CARDIA)    Difficulty of Paying Living Expenses: Somewhat hard  Food Insecurity: No Food Insecurity (10/19/2021)   Hunger Vital Sign    Worried About Running Out of Food in the Last Year: Never true    Ran Out of Food in the Last Year: Never true  Transportation Needs: No Transportation Needs (05/09/2022)   PRAPARE - Administrator, Civil Service (Medical): No    Lack of Transportation (Non-Medical): No  Physical Activity: Inactive (05/09/2022)   Exercise Vital Sign    Days of Exercise per Week: 0 days    Minutes of Exercise per Session: 0 min  Stress: No Stress Concern Present (05/11/2021)   [employer] of Occupational Health - Occupational Stress Questionnaire    Feeling of Stress : Only a little  Social Connections: Moderately Integrated (04/19/2022)   Social Connection and Isolation Panel [NHANES]    Frequency of Communication with Friends and  Family: Once a week    Frequency of Social Gatherings with Friends and Family: Twice a week    Attends Religious Services: More than 4 times per year    Active Member of [employer] or Organizations: No    Attends Engineer, structural: Never    Marital Status: Married    Objective:  BP (!) 160/60   Pulse 72   Temp (!) 97.2 F (36.2 C)   Resp 18   Ht 5\' 4"  (1.626 m)   Wt 108 lb (49 kg)   BMI 18.54 kg/m      06/29/2022    2:06 PM 05/24/2022    1:25 PM 05/12/2022   11:30 AM  BP/Weight  Systolic BP 160 160 190  Diastolic BP 60 60 90  Wt. (Lbs) 108 111   BMI 18.54 kg/m2 19.05 kg/m2     Physical Exam Vitals reviewed.  Constitutional:      Appearance: Normal appearance.     Comments: thin  Neck:     Vascular: No carotid bruit.  Cardiovascular:     Rate and Rhythm: Normal rate and regular rhythm.     Heart sounds: Normal heart sounds.  Pulmonary:     Effort: Pulmonary effort is normal. No respiratory distress.     Breath sounds: Normal breath sounds.  Abdominal:     General: Abdomen is flat. Bowel sounds are normal.     Palpations: Abdomen is soft.     Tenderness: There is abdominal tenderness (epigastric and generalized.).  Neurological:     Mental Status: She is alert and oriented to person, place, and time.  Psychiatric:        Mood and Affect: Mood normal.  Behavior: Behavior normal.     Diabetic Foot Exam - Simple   No data filed      Lab Results  Component Value Date   WBC 8.9 06/29/2022   HGB 13.2 06/29/2022   HCT 39.9 06/29/2022   PLT 244 06/29/2022   GLUCOSE 87 06/29/2022   CHOL 170 06/29/2022   TRIG 104 06/29/2022   HDL 73 06/29/2022   LDLCALC 79 06/29/2022   ALT 14 06/29/2022   AST 15 06/29/2022   NA 138 06/29/2022   K 4.8 06/29/2022   CL 102 06/29/2022   CREATININE 0.79 06/29/2022   BUN 19 06/29/2022   CO2 22 06/29/2022   TSH 0.112 (L) 06/29/2022   INR 0.9 07/14/2020   HGBA1C 5.9 (H) 12/08/2021      Assessment & Plan:     Hypertension with heart disease Assessment & Plan: Hold simvastatin. Continue check blood pressure twice daily.  Recommend continue olmesartan to 20 mg twice daily.  Keep visit with hypertension clinic scheduled in July.  Orders: -     CBC with Differential/Platelet -     Comprehensive metabolic panel  Coronary artery disease involving native coronary artery of native heart with unstable angina pectoris [facility]) Assessment & Plan: Hold simvastatin. Continue check blood pressure twice daily. Keep visit with hypertension clinic scheduled in July.  Orders: -     Lipid panel  Acquired hypothyroidism -     T4, free -     TSH  Muscle cramps Assessment & Plan: Checked labs.  Held simvastatin  Orders: -     Phosphorus -     Magnesium -     CK     No orders of the defined types were placed in this encounter.   Orders Placed This Encounter  Procedures   CBC with Differential/Platelet   Comprehensive metabolic panel   T4, free   TSH   Lipid panel   Phosphorus   Magnesium   CK     Follow-up: Return in about 6 weeks (around 08/10/2022) for chronic follow up.   I,Carolyn M Morrison,acting as a Neurosurgeon for Blane Ohara, MD.,have documented all relevant documentation on the behalf of Blane Ohara, MD,as directed by  Blane Ohara, MD while in the presence of Blane Ohara, MD.   An After Visit Summary was printed and given to the patient.  Blane Ohara, MD [facility] [phone]

## 2022-06-29 NOTE — Patient Instructions (Signed)
Hold simvastatin. Continue check blood pressure twice daily. Keep visit with hypertension clinic scheduled in July.

## 2022-06-30 LAB — COMPREHENSIVE METABOLIC PANEL
ALT: 14 IU/L (ref 0–32)
AST: 15 IU/L (ref 0–40)
Albumin/Globulin Ratio: 1.8 (ref 1.2–2.2)
Albumin: 4.6 g/dL (ref 3.7–4.7)
Alkaline Phosphatase: 81 IU/L (ref 44–121)
BUN/Creatinine Ratio: 24 (ref 12–28)
BUN: 19 mg/dL (ref 8–27)
Bilirubin Total: 0.4 mg/dL (ref 0.0–1.2)
CO2: 22 mmol/L (ref 20–29)
Calcium: 10.7 mg/dL — ABNORMAL HIGH (ref 8.7–10.3)
Chloride: 102 mmol/L (ref 96–106)
Creatinine, Ser: 0.79 mg/dL (ref 0.57–1.00)
Globulin, Total: 2.5 g/dL (ref 1.5–4.5)
Glucose: 87 mg/dL (ref 70–99)
Potassium: 4.8 mmol/L (ref 3.5–5.2)
Sodium: 138 mmol/L (ref 134–144)
Total Protein: 7.1 g/dL (ref 6.0–8.5)
eGFR: 71 mL/min/{1.73_m2} (ref >59)

## 2022-06-30 LAB — CBC WITH DIFFERENTIAL/PLATELET
Basophils Absolute: 0.1 10*3/uL (ref 0.0–0.2)
Basos: 1 %
EOS (ABSOLUTE): 0.4 10*3/uL (ref 0.0–0.4)
Eos: 4 %
Hematocrit: 39.9 % (ref 34.0–46.6)
Hemoglobin: 13.2 g/dL (ref 11.1–15.9)
Immature Grans (Abs): 0 10*3/uL (ref 0.0–0.1)
Immature Granulocytes: 0 %
Lymphocytes Absolute: 2.3 10*3/uL (ref 0.7–3.1)
Lymphs: 26 %
MCH: 30.8 pg (ref 26.6–33.0)
MCHC: 33.1 g/dL (ref 31.5–35.7)
MCV: 93 fL (ref 79–97)
Monocytes Absolute: 1 10*3/uL — ABNORMAL HIGH (ref 0.1–0.9)
Monocytes: 11 %
Neutrophils Absolute: 5.1 10*3/uL (ref 1.4–7.0)
Neutrophils: 58 %
Platelets: 244 10*3/uL (ref 150–450)
RBC: 4.29 x10E6/uL (ref 3.77–5.28)
RDW: 13.9 % (ref 11.7–15.4)
WBC: 8.9 10*3/uL (ref 3.4–10.8)

## 2022-06-30 LAB — LIPID PANEL
Chol/HDL Ratio: 2.3 ratio (ref 0.0–4.4)
Cholesterol, Total: 170 mg/dL (ref 100–199)
HDL: 73 mg/dL (ref >39)
LDL Chol Calc (NIH): 79 mg/dL (ref 0–99)
Triglycerides: 104 mg/dL (ref 0–149)
VLDL Cholesterol Cal: 18 mg/dL (ref 5–40)

## 2022-06-30 LAB — T4, FREE: Free T4: 1.72 ng/dL (ref 0.82–1.77)

## 2022-06-30 LAB — CK: Total CK: 52 U/L (ref 26–161)

## 2022-06-30 LAB — TSH: TSH: 0.112 u[IU]/mL — ABNORMAL LOW (ref 0.450–4.500)

## 2022-06-30 LAB — MAGNESIUM: Magnesium: 2.4 mg/dL — ABNORMAL HIGH (ref 1.6–2.3)

## 2022-06-30 LAB — PHOSPHORUS: Phosphorus: 3.2 mg/dL (ref 3.0–4.3)

## 2022-06-30 LAB — CARDIOVASCULAR RISK ASSESSMENT

## 2022-07-03 ENCOUNTER — Encounter: Payer: Self-pay | Admitting: Family Medicine

## 2022-07-03 ENCOUNTER — Other Ambulatory Visit: Payer: Self-pay

## 2022-07-03 MED ORDER — LEVOTHYROXINE SODIUM 100 MCG PO TABS: 100.0000 ug | ORAL_TABLET | Freq: Every day | ORAL | 2 refills | Status: DC

## 2022-07-03 NOTE — Assessment & Plan Note (Signed)
>>  ASSESSMENT AND PLAN FOR HYPERTENSION WITH HEART DISEASE WRITTEN ON 07/03/2022  9:21 AM BY [name], [name], MD  Hold simvastatin . Continue check blood pressure twice daily.  Recommend continue olmesartan  to 20 mg twice daily.  Keep visit with hypertension clinic scheduled in July.

## 2022-07-03 NOTE — Assessment & Plan Note (Signed)
Hold simvastatin. Continue check blood pressure twice daily. Keep visit with hypertension clinic scheduled in July. 

## 2022-07-03 NOTE — Assessment & Plan Note (Signed)
Hold simvastatin. Continue check blood pressure twice daily.  Recommend continue olmesartan to 20 mg twice daily.  Keep visit with hypertension clinic scheduled in July.

## 2022-07-03 NOTE — Assessment & Plan Note (Signed)
Checked labs.  Held simvastatin

## 2022-07-04 NOTE — Addendum Note (Signed)
Addended by: Tawny Asal I on: 07/04/2022 05:23 PM   Modules accepted: Orders

## 2022-07-07 ENCOUNTER — Telehealth: Payer: Self-pay

## 2022-07-07 ENCOUNTER — Telehealth: Payer: Self-pay | Admitting: Cardiovascular Disease

## 2022-07-07 NOTE — Telephone Encounter (Signed)
FYI Called patient and I can hear the labored breathing and her trying to catch her breath while talking on the phone. She did manage to get out that she has pain on the left side of her chest with nausea and headache. She did not want to call 911 and wanted to wait for her husband to come home. I called her husband and let him know that she needs to go to the ED and he states he is on his way home to her.   I was on the phone with patient when her husband walked through the door. She stated they are going to the ED.

## 2022-07-07 NOTE — Telephone Encounter (Signed)
Danielle with patient's PCP office is calling stating the patient informed them she is having symptoms of labored breathing, SOB, a dull burning sensation on the left side, nausea, and a headache. Patient's BP this morning was 99-53 HR 80.   She is requesting the pt be contacted and scheduled for a sooner appt.   Please advise.

## 2022-07-07 NOTE — Progress Notes (Cosign Needed)
Cardiology called back and stated they are calling the pt and advising her to go the ER  Roxana Hires, [facility] Clinical Pharmacist Assistant  [phone]

## 2022-07-07 NOTE — Progress Notes (Signed)
Care Management & Coordination Services Pharmacy Team  Reason for Encounter: Hypertension  Contacted patient to discuss hypertension disease state. Spoke with patient on 07/07/2022     Current antihypertensive regimen:  Olmesartan 40mg  daily Patient verbally confirms she is taking the above medications as directed. Yes  How often are you checking your Blood Pressure? twice daily  she checks her blood pressure in the morning and in the evening before taking her medication.  Current home BP readings:  07/07/22 99/53 80 07/06/22 117/68 77 am  113/65 68 pm 07/05/22 113/58 75 am  127/67 62 pm 07/04/22 120/64 81 am  127/64 65 pm  Wrist or arm cuff: Arm  Caffeine intake:1 Cup of coffee Salt intake:None  Note: Pt reports she is always SOB, Labored Breathing and is having a dull pain/burning on her left sided chest and gets nauseous. Pain has been going on for about a week but she has experienced it before, off and on but stated its getting worse. She denies any swelling. She complains of a dull headache today. She stated when she gets up in the morning and sits for a minute and drinks water, she gets to feeling better. She is having trouble swallowing and has to eat very slow which causes her to get nauseous when she eats. Pt is following up with GI on these issues.   Called her Cardiologist, Dr. Royann Shivers MD office, spoke with Morrie Sheldon and she is sending a high priority message to triage and they will reach out to pt to get her a sooner appt and address her symptoms. Pt was informed they will be reaching out to her.   Any readings above 180/100? No  What recent interventions/DTPs have been made by any provider to improve Blood Pressure control since last CPP Visit: Pt was advised to only take her BP medicine if her BP goes over 130, otherwise she does not take it.   Any recent hospitalizations or ED visits since last visit with CPP? No  What diet changes have been made to improve Blood Pressure  Control?  Pt eats a lot of veggies and fruits. Limits her meat intake   What exercise is being done to improve your Blood Pressure Control?  Walks a bit when she can, outside.  Adherence Review: Is the patient currently on ACE/ARB medication? Yes Does the patient have >5 day gap between last estimated fill dates? No  Star Rating Drugs:  Medication:  Last Fill: Day Supply Olmesartan                 06/24/22 04/17/22            90ds  Simvastatin   06/18/22-02/20/22  90ds (This is being held)  Chart Updates: Recent office visits:  06/29/22  Blane Ohara MD. Seen for follow up. D/C Hydralazine HCI 10mg . Hold simvastatin.   Recent consult visits:  None  Hospital visits:  None  Medications: Outpatient Encounter Medications as of 07/07/2022  Medication Sig   aspirin 81 MG tablet Take 81 mg by mouth daily.   Budeson-Glycopyrrol-Formoterol (BREZTRI AEROSPHERE) 160-9-4.8 MCG/ACT AERO Inhale 2 puffs into the lungs 2 (two) times daily as needed (for flares).   Cholecalciferol (VITAMIN D3 SUPER STRENGTH) 50 MCG (2000 UT) CAPS Take 2,000 Units by mouth daily after lunch.   dorzolamide-timolol (COSOPT) 22.3-6.8 MG/ML ophthalmic solution Place 1 drop into the left eye 2 (two) times daily.    latanoprost (XALATAN) 0.005 % ophthalmic solution Place 1 drop into the left eye  at bedtime.   levothyroxine (SYNTHROID) 100 MCG tablet Take 1 tablet (100 mcg total) by mouth daily before breakfast.   olmesartan (BENICAR) 40 MG tablet Take 40 mg by mouth at bedtime.   simvastatin (ZOCOR) 40 MG tablet TAKE 1 TABLET BY MOUTH EVERYDAY AT BEDTIME   Vonoprazan Fumarate (VOQUEZNA) 20 MG TABS Take 20 mg by mouth daily.   XELPROS 0.005 % EMUL Place 1 drop into the left eye at bedtime.   No facility-administered encounter medications on file as of 07/07/2022.    Recent Office Vitals: BP Readings from Last 3 Encounters:  06/29/22 (!) 160/60  05/24/22 (!) 160/60  05/12/22 (!) 190/90   Pulse Readings from Last 3  Encounters:  06/29/22 72  05/24/22 69  05/12/22 80    Wt Readings from Last 3 Encounters:  06/29/22 108 lb (49 kg)  05/24/22 111 lb (50.3 kg)  05/01/22 112 lb 6.4 oz (51 kg)     Kidney Function Lab Results  Component Value Date/Time   CREATININE 0.79 06/29/2022 03:20 PM   CREATININE 0.82 04/19/2022 02:27 PM   GFR 60.94 01/27/2020 04:11 PM   GFRNONAA >60 01/05/2021 12:38 PM   GFRAA 64 12/22/2019 11:40 AM       Latest Ref Rng & Units 06/29/2022    3:20 PM 04/19/2022    2:27 PM 12/08/2021    3:55 PM  BMP  Glucose 70 - 99 mg/dL 87  84  93   BUN 8 - 27 mg/dL 19  20  17    Creatinine 0.57 - 1.00 mg/dL 1.61  0.96  0.45   BUN/Creat Ratio 12 - 28 24  24  18    Sodium 134 - 144 mmol/L 138  140  142   Potassium 3.5 - 5.2 mmol/L 4.8  5.0  5.1   Chloride 96 - 106 mmol/L 102  102  106   CO2 20 - 29 mmol/L 22  22  24    Calcium 8.7 - 10.3 mg/dL 40.9  81.1  91.4      Roxana Hires, [facility] Clinical Lobbyist  [phone]

## 2022-07-10 NOTE — Telephone Encounter (Cosign Needed)
07/10/22- Called to check in on patient after advisement to go to the ER.   Spoke with Ella today and she did go to the ER on Friday.  She went to [facility] and they did an EKG (Normal), Xray (Normal) and lab work.   They did a breathing treatment that she did say helped with her breathing issues.  She stated the MD did not think she was having a heart attack since her test did not show any signs of that.  They think because her breathing, it was muscular chest pains due to labored breathing. They want her to follow up with Cardiology and Pulmonology.  They think she has a bad case of acid reflux that is causing her throat and stomach pains.  Pt stated she is feeling she felt rough when she first woke up and now feels better right now. Pt is still have stomach pains that is nagging her. She denies on being on any acid reflux medications. She has been on them but could not tell a difference in her symptoms. Pt stated her stomach was burning this morning and feels better when she sits up verses lying down. She wants to try another acid reducers ASAP to help relieve her symptoms. Pt cannot swallow very well because her throat. She needs something small or liquid form. Pt wants something sent in to [facility], [facility] on [address] [place].  Please advise!!  BP reading: 07/09/22 108/42 70 07/10/22 138/71 74   Roxana Hires, CMA Clinical Pharmacist Assistant  [phone]

## 2022-07-11 ENCOUNTER — Telehealth: Payer: Self-pay

## 2022-07-11 NOTE — Telephone Encounter (Signed)
Transition Care Management Unsuccessful Follow-up Telephone Call  Date of discharge and from where:  07/07/2022 [facility]  Attempts:  1st Attempt  Reason for unsuccessful TCM follow-up call:  No answer/busy  Montrail Mehrer [facility]  [facility] Population Health Community Resource Care Guide   ??[email or web address]  ?? [phone]   Website: [email or web address]  [email or web address]

## 2022-07-13 ENCOUNTER — Telehealth: Payer: Self-pay

## 2022-07-13 NOTE — Telephone Encounter (Signed)
Transition Care Management Unsuccessful Follow-up Telephone Call  Date of discharge and from where:  07/07/2022 [facility]  Attempts:  2nd Attempt  Reason for unsuccessful TCM follow-up call:  No answer/busy  [facility]man [facility]  [facility] Population Health Community Resource Care Guide   ??[email or web address]  ?? [phone]   Website: [email or web address]  [email or web address]

## 2022-07-18 ENCOUNTER — Other Ambulatory Visit: Payer: Self-pay | Admitting: Family Medicine

## 2022-07-18 DIAGNOSIS — R1013 Epigastric pain: Secondary | ICD-10-CM

## 2022-07-18 NOTE — Telephone Encounter (Signed)
[facility] REFERRAL SENT. DR. Sedalia Muta

## 2022-07-20 ENCOUNTER — Ambulatory Visit (INDEPENDENT_AMBULATORY_CARE_PROVIDER_SITE_OTHER): Payer: Medicare Other | Admitting: Family Medicine

## 2022-07-20 VITALS — BP 124/64 | HR 90 | Temp 98.7°F | Resp 12 | Ht 64.0 in | Wt 108.0 lb

## 2022-07-20 DIAGNOSIS — R0989 Other specified symptoms and signs involving the circulatory and respiratory systems: Secondary | ICD-10-CM

## 2022-07-20 DIAGNOSIS — R531 Weakness: Secondary | ICD-10-CM | POA: Diagnosis not present

## 2022-07-20 DIAGNOSIS — R109 Unspecified abdominal pain: Secondary | ICD-10-CM

## 2022-07-20 DIAGNOSIS — I952 Hypotension due to drugs: Secondary | ICD-10-CM

## 2022-07-20 NOTE — Progress Notes (Signed)
Subjective:  Patient ID: Leah Pittman, female    DOB: 18-Oct-1930  Age: 89 y.o. MRN: [id number]  Chief Complaint  Patient presents with   Hypotension    HPI Leah Pittman comes in for recheck of her bp.  Over the last two weeks her bp has ranged from 95/48, 70P - 162/79. She has only taken the olmesartan 20 mg twice in the last two weeks. When she did this her bp then dropped 90-105/70-80.  She is feeling better since stopping the statin medication.  Her weakness has improved.   She checks her bp twice daily. She would take an olmesartan 20 mg when her sbp > 150. On Wednesday she took two olmesartan. Than today woke up with bp very low.   Patient has on going abdominal pain. Voquezna did not help. Has tried PPIs without much improvement. She has seen Dr. Chales Abrahams in the past, but it has been about 2 years ago. She has tried dicyclomine in the past but is not sure if it helped. She has tried numerous CIC meds, such as amitiza, linzess, and otc stool softeners. Nothing has really helped. She had called to request a new GI referral. She would like a second opinion.      07/20/2022    2:24 PM 06/29/2022    2:13 PM 04/19/2022    1:34 PM 06/29/2021    9:28 AM 05/11/2021    4:28 PM  Depression screen PHQ 2/9  Decreased Interest 0 0 0 0 0  Down, Depressed, Hopeless 0 0 0 0 0  PHQ - 2 Score 0 0 0 0 0  Altered sleeping  2     Tired, decreased energy  3     Change in appetite  3     Feeling bad or failure about yourself   0     Trouble concentrating  0     Moving slowly or fidgety/restless  0     Suicidal thoughts  0     PHQ-9 Score  8     Difficult doing work/chores  Somewhat difficult           07/20/2022    2:24 PM  Fall Risk   Falls in the past year? 0  Number falls in past yr: 0  Injury with Fall? 0  Risk for fall due to : Impaired balance/gait;Impaired mobility  Follow up Falls evaluation completed;Falls prevention discussed    Patient Care Team: Blane Ohara, MD as PCP - General (Family  Medicine) Croitoru, Rachelle Hora, MD as PCP - Cardiology (Cardiology) Lucie Leather Alvira Philips, MD as Consulting Physician (Allergy and Immunology) Lynann Bologna, MD as Consulting Physician (Gastroenterology) Zettie Pho, [facility] (Pharmacist)   Review of Systems  Constitutional:  Positive for fatigue.  HENT:  Positive for postnasal drip and voice change (hoarseness).   Respiratory:  Positive for shortness of breath. Negative for cough.   Cardiovascular:  Negative for chest pain and palpitations.  Gastrointestinal:  Positive for abdominal pain (gnawing discomfort upper abdomen) and nausea.  Neurological:  Positive for weakness and headaches (pounding headache in left ear.).  Psychiatric/Behavioral:  Negative for dysphoric mood. The patient is nervous/anxious.     Current Outpatient Medications on File Prior to Visit  Medication Sig Dispense Refill   olmesartan (BENICAR) 20 MG tablet Take 20 mg by mouth daily.     aspirin 81 MG tablet Take 81 mg by mouth daily.     Budeson-Glycopyrrol-Formoterol (BREZTRI AEROSPHERE) 160-9-4.8 MCG/ACT AERO Inhale 2 puffs into the  lungs 2 (two) times daily as needed (for flares). 10.7 g 11   Cholecalciferol (VITAMIN D3 SUPER STRENGTH) 50 MCG (2000 UT) CAPS Take 2,000 Units by mouth daily after lunch.     dorzolamide-timolol (COSOPT) 22.3-6.8 MG/ML ophthalmic solution Place 1 drop into the left eye 2 (two) times daily.      latanoprost (XALATAN) 0.005 % ophthalmic solution Place 1 drop into the left eye at bedtime.     levothyroxine (SYNTHROID) 100 MCG tablet Take 1 tablet (100 mcg total) by mouth daily before breakfast. 30 tablet 2   Vonoprazan Fumarate (VOQUEZNA) 20 MG TABS Take 20 mg by mouth daily. (Patient not taking: Reported on 07/20/2022) 20 tablet 0   XELPROS 0.005 % EMUL Place 1 drop into the left eye at bedtime.     No current facility-administered medications on file prior to visit.   Past Medical History:  Diagnosis Date   Anxiety    Arthritis    Chest pain  2011   CARDIOLITE - no significant symptoms, EKG changes, or arrhythmias   Congenital prolapse of bladder mucosa    Diverticulosis    Dyspnea 02/16/2012   2D ECHO - EF 55-60%, normal   Esophageal dysphagia    Fatigue    GERD (gastroesophageal reflux disease)    Glaucoma    Headache(784.0)    Heart attack (HCC)    Hiatal hernia    High blood pressure 02/16/2012   RENAL DOPPLER - normal   Hyperlipidemia    Hypothyroidism    Labile hypertension    Lightheadedness    Migraine headache    Multiple allergies    Myalgia    NSTEMI (non-ST elevated myocardial infarction) (HCC) 07/14/2020   OSA (obstructive sleep apnea)    Pain, lower leg    Calf   Problem of menstruation    Proteinuria    Reflux    SOB (shortness of breath)    Thyroid disease    Urinary problem    Valvular heart disease    Vitamin B12 deficiency    Vitamin D deficiency    Past Surgical History:  Procedure Laterality Date   ABDOMINAL HYSTERECTOMY  1976   CHOLECYSTECTOMY  2000   COLONOSCOPY  07/27/2014   Moderate predominantly sigmoid divertuculosis. Otherwise normal colonoscopy   CORONARY ARTERY BYPASS GRAFT N/A 07/10/2018   Procedure: CORONARY ARTERY BYPASS GRAFTING (CABG), FREE LIMA;  Surgeon: [name], MD;  Location: [facility] OR;  Service: Open Heart Surgery;  Laterality: N/A;   CYST REMOVAL NECK     CYSTECTOMY  1974   Intestine   CYSTECTOMY  1996   Brain stem   ESOPHAGOGASTRODUODENOSCOPY  12/07/2016   Schatzki's ring status post esophageal dilatation. Small hiatal hernia. Mild gastritis   EYE SURGERY  2003   EYE SURGERY  07/2016   LEFT HEART CATH AND CORONARY ANGIOGRAPHY N/A 07/08/2018   Procedure: LEFT HEART CATH AND CORONARY ANGIOGRAPHY;  Surgeon: Runell Gess, MD;  Location: [facility] INVASIVE CV LAB;  Service: Cardiovascular;  Laterality: N/A;   LEFT HEART CATH AND CORONARY ANGIOGRAPHY N/A 07/25/2019   Procedure: LEFT HEART CATH AND CORONARY ANGIOGRAPHY;  Surgeon: Kathleene Hazel, MD;   Location: [facility] INVASIVE CV LAB;  Service: Cardiovascular;  Laterality: N/A;   MOUTH SURGERY  2013   RADIOLOGY WITH ANESTHESIA N/A 11/05/2019   Procedure: MRI WITH ANESTHESIA;  Surgeon: Radiologist, Medication, MD;  Location: [facility] OR;  Service: Radiology;  Laterality: N/A;   TEE WITHOUT CARDIOVERSION N/A 07/10/2018   Procedure: TRANSESOPHAGEAL  ECHOCARDIOGRAM (TEE);  Surgeon: [name], MD;  Location: [facility] OR;  Service: Open Heart Surgery;  Laterality: N/A;   TONSILLECTOMY     age 41    Family History  Problem Relation Age of Onset   Other Mother        blood disorder - unsure of name   Heart attack Father    Stroke Father    Cancer Sister    Cancer Brother    Asthma Brother    Cancer Brother    Social History   Socioeconomic History   Marital status: Married    Spouse name: Not on file   Number of children: 3   Years of education: college   Highest education level: Not on file  Occupational History   Occupation: Retired  Tobacco Use   Smoking status: Never   Smokeless tobacco: Never  Building services engineer Use: Never used  Substance and Sexual Activity   Alcohol use: No   Drug use: No   Sexual activity: Not on file  Other Topics Concern   Not on file  Social History Narrative   Lives with husband in [place], [place].   Right-handed.   No daily caffeine use.   Social Determinants of Health   Financial Resource Strain: Medium Risk (10/19/2021)   Overall Financial Resource Strain (CARDIA)    Difficulty of Paying Living Expenses: Somewhat hard  Food Insecurity: No Food Insecurity (10/19/2021)   Hunger Vital Sign    Worried About Running Out of Food in the Last Year: Never true    Ran Out of Food in the Last Year: Never true  Transportation Needs: No Transportation Needs (05/09/2022)   PRAPARE - Administrator, Civil Service (Medical): No    Lack of Transportation (Non-Medical): No  Physical Activity: Inactive (05/09/2022)   Exercise Vital Sign    Days of Exercise  per Week: 0 days    Minutes of Exercise per Session: 0 min  Stress: No Stress Concern Present (05/11/2021)   [employer] of Occupational Health - Occupational Stress Questionnaire    Feeling of Stress : Only a little  Social Connections: Moderately Integrated (04/19/2022)   Social Connection and Isolation Panel [NHANES]    Frequency of Communication with Friends and Family: Once a week    Frequency of Social Gatherings with Friends and Family: Twice a week    Attends Religious Services: More than 4 times per year    Active Member of [employer] or Organizations: No    Attends Engineer, structural: Never    Marital Status: Married    Objective:  BP 124/64   Pulse 90   Temp 98.7 F (37.1 C)   Resp 12   Ht 5\' 4"  (1.626 m)   Wt 108 lb (49 kg)   BMI 18.54 kg/m      07/21/2022    5:01 PM 06/29/2022    2:06 PM 05/24/2022    1:25 PM  BP/Weight  Systolic BP 124 160 160  Diastolic BP 64 60 60  Wt. (Lbs) 108 108 111  BMI 18.54 kg/m2 18.54 kg/m2 19.05 kg/m2    Physical Exam Vitals reviewed.  Constitutional:      Appearance: Normal appearance.  Neck:     Vascular: No carotid bruit.  Cardiovascular:     Rate and Rhythm: Normal rate and regular rhythm.     Pulses: Normal pulses.     Heart sounds: Normal heart sounds.  Pulmonary:  Effort: Pulmonary effort is normal.     Breath sounds: Normal breath sounds.  Abdominal:     General: Bowel sounds are normal.     Palpations: Abdomen is soft.     Tenderness: There is abdominal tenderness (mild epigastric).  Neurological:     Mental Status: She is alert and oriented to person, place, and time.  Psychiatric:        Mood and Affect: Mood normal.        Behavior: Behavior normal.     Diabetic Foot Exam - Simple   No data filed      Lab Results  Component Value Date   WBC 8.9 06/29/2022   HGB 13.2 06/29/2022   HCT 39.9 06/29/2022   PLT 244 06/29/2022   GLUCOSE 87 06/29/2022   CHOL 170 06/29/2022   TRIG 104  06/29/2022   HDL 73 06/29/2022   LDLCALC 79 06/29/2022   ALT 14 06/29/2022   AST 15 06/29/2022   NA 138 06/29/2022   K 4.8 06/29/2022   CL 102 06/29/2022   CREATININE 0.79 06/29/2022   BUN 19 06/29/2022   CO2 22 06/29/2022   TSH 0.112 (L) 06/29/2022   INR 0.9 07/14/2020   HGBA1C 5.9 (H) 12/08/2021      Assessment & Plan:    Labile hypertension Assessment & Plan: Continue to check bp twice daily. BP goes up into the 160-170s, but then when medicates bp drops to 95-105/80s.  If sbp greater than 140 take olmesartan 20 mg 1/2 tablet up to twice daily.  We may need to decrease olmesartan to 5 mg 1-2 times per daily     Weakness Assessment & Plan: Recommended chair exercises.    Abdominal discomfort Assessment & Plan: Referring to [facility].       No orders of the defined types were placed in this encounter.   No orders of the defined types were placed in this encounter.    Follow-up: No follow-ups on file.   I,Carolyn M Morrison,acting as a Neurosurgeon for Blane Ohara, MD.,have documented all relevant documentation on the behalf of Blane Ohara, MD,as directed by  Blane Ohara, MD while in the presence of Blane Ohara, MD.    Clayborn Bigness I Leal-Borjas,acting as a scribe for Blane Ohara, MD.,have documented all relevant documentation on the behalf of Blane Ohara, MD,as directed by  Blane Ohara, MD while in the presence of Blane Ohara, MD.  An After Visit Summary was printed and given to the patient.  I attest that I have reviewed this visit and agree with the plan scribed by my staff.   Blane Ohara, MD [facility] [phone]

## 2022-07-20 NOTE — Patient Instructions (Addendum)
Continue to check bp twice daily.   If sbp greater than 140 take olmesartan 20 mg 1/2 tablet up to twice daily.     Begin chair exercises.

## 2022-07-21 ENCOUNTER — Encounter: Payer: Self-pay | Admitting: Family Medicine

## 2022-07-21 DIAGNOSIS — R0989 Other specified symptoms and signs involving the circulatory and respiratory systems: Secondary | ICD-10-CM | POA: Insufficient documentation

## 2022-07-21 DIAGNOSIS — R109 Unspecified abdominal pain: Secondary | ICD-10-CM | POA: Insufficient documentation

## 2022-07-21 NOTE — Assessment & Plan Note (Addendum)
Continue to check bp twice daily. BP goes up into the 160-170s, but then when medicates bp drops to 95-105/80s.  If sbp greater than 140 take olmesartan 20 mg 1/2 tablet up to twice daily.  We may need to decrease olmesartan to 5 mg 1-2 times per daily

## 2022-07-21 NOTE — Assessment & Plan Note (Signed)
Recommended chair exercises

## 2022-07-21 NOTE — Assessment & Plan Note (Signed)
Referring to [facility].

## 2022-07-27 ENCOUNTER — Telehealth: Payer: Self-pay

## 2022-07-27 NOTE — Patient Outreach (Signed)
  Care Coordination   07/27/2022 Name: Leah Pittman MRN: [id number] DOB: 09-28-1930   Care Coordination Outreach Attempts:  An unsuccessful telephone outreach was attempted today to offer the patient information about available care coordination services.  Follow Up Plan:  Additional outreach attempts will be made to offer the patient care coordination information and services.   Encounter Outcome:  No Answer   Care Coordination Interventions:  No, not indicated    Rowe Pavy, RN, BSN, [facility] [employer] [phone]

## 2022-08-02 ENCOUNTER — Telehealth: Payer: Self-pay

## 2022-08-02 NOTE — Patient Outreach (Signed)
  Care Coordination   08/02/2022 Name: Leah Pittman MRN: [id number] DOB: [birth year]   Care Coordination Outreach Attempts:  A second unsuccessful outreach was attempted today to offer the patient with information about available care coordination services.  Follow Up Plan:  Additional outreach attempts will be made to offer the patient care coordination information and services.   Encounter Outcome:  No Answer   Care Coordination Interventions:  No, not indicated    Rowe Pavy, RN, BSN, [facility] [employer] [phone]

## 2022-08-03 ENCOUNTER — Telehealth: Payer: Self-pay

## 2022-08-03 NOTE — Patient Outreach (Signed)
  Care Coordination   Initial Visit Note   08/03/2022 Name: Leah Pittman MRN: [id number] DOB: 08/05/1930  Leah Pittman is a 89 y.o. year old female who sees [name], [name], MD for primary care. I spoke with  Leah Pittman by phone today.  What matters to the patients health and wellness today?  Placed call to patient today to review and offer [facility] care coordination program.  Patient reports that she is interested. Verbal consent obtained.  Patient reports to me that she is having great difficulty with her stomach and hoarseness. Reports PCP put in a referral to GI.  Patient reports she has not heard about an appointment yet.     Goals Addressed               This Visit's Progress     Abdominal pain and pending referral (pt-stated)        Interventions Today    Flowsheet Row Most Recent Value  Chronic Disease   Chronic disease during today's visit Other  [abdominal pain and hoarseness]  General Interventions   General Interventions Discussed/Reviewed General Interventions Discussed, Communication with  Communication with PCP/Specialists  [Placed call to [facility], I was informed that office has been unable to reach patient and has mailed a letter.]  Education Interventions   Education Provided Provided Education  [Reviewed with patient that GI office has been trying to reach her.  She states that she gets alot of spam calls,  Reviewed with patient that the office states they have mailed her a letter. Made an appointment for 10/20/2022 at 1:15 (Dr. . Karki)]      Provided my contact information. Scheduled initial assessment.  Secured specialist appointment and communicated to patient.  Patient is visually impaired. Husband is hearing impaired.         SDOH assessments and interventions completed:  No     Care Coordination Interventions:  Yes, provided   Follow up plan: Follow up call scheduled for 08/09/2022    Encounter Outcome:  Pt. Visit Completed   Rowe Pavy,  RN, BSN, CEN [facility] Coordinator [phone]

## 2022-08-08 ENCOUNTER — Ambulatory Visit: Payer: Medicare Other | Admitting: Gastroenterology

## 2022-08-09 ENCOUNTER — Ambulatory Visit: Payer: Self-pay

## 2022-08-09 NOTE — Patient Outreach (Addendum)
  Care Coordination   Follow Up Visit Note   08/09/2022 Name: HYDEE NUERNBERGER MRN: [id number] DOB: 09/14/1930  VIRL DOAKES is a 89 y.o. year old female who sees [name], [name], MD for primary care. I spoke with  [name] by phone today.  What matters to the patients health and wellness today?  Follow up call with patient.  Patient struggling with worsening shortness of breath today.  Difficulty talking on the phone.  Feels weak.   Not able to complete assessment due to shortness of breath and weakness today.    Goals Addressed               This Visit's Progress     Abdominal pain and pending referral (pt-stated)        Interventions Today    Flowsheet Row Most Recent Value  Chronic Disease   Chronic disease during today's visit Other  [shortness of breath]  General Interventions   Communication with PCP/Specialists  [secured chat sent to MD to inform of worsening Shortness of breath.]  Education Interventions   Education Provided Provided Education  [During phone call patient is very short of breath. I have encouraged patient to call 911 or go to the hospital and she doesnot want to at this time. Message sent to MD.]              SDOH assessments and interventions completed:  No     Care Coordination Interventions:  Yes, provided   Follow up plan: Follow up call scheduled for 08/14/2022    Encounter Outcome:  Pt. Visit Completed   Rowe Pavy, RN, BSN, CEN [facility] Coordinator [phone]

## 2022-08-13 ENCOUNTER — Other Ambulatory Visit: Payer: Self-pay

## 2022-08-13 DIAGNOSIS — E039 Hypothyroidism, unspecified: Secondary | ICD-10-CM

## 2022-08-14 ENCOUNTER — Ambulatory Visit: Payer: Self-pay

## 2022-08-14 ENCOUNTER — Other Ambulatory Visit: Payer: Medicare Other

## 2022-08-14 NOTE — Patient Outreach (Signed)
  Care Coordination   Follow Up Visit Note   08/14/2022 Name: Leah Pittman MRN: [id number] DOB: 1931-01-10  Leah Pittman is a 89 y.o. year old female who sees [name], [name], MD for primary care. I spoke with  [name] by phone today.  What matters to the patients health and wellness today?  Patient reports that she continues to feel bad but that she has follow up planned with Dr. Sedalia Muta  and now has a scheduled GI appointment. Denies any additional needs.     Goals Addressed               This Visit's Progress     COMPLETED: Abdominal pain and pending referral (pt-stated)        Interventions Today    Flowsheet Row Most Recent Value  Chronic Disease   Chronic disease during today's visit Other  [shortness of breath]  General Interventions   Communication with PCP/Specialists  [secured chat sent to MD to inform of worsening Shortness of breath.]  Education Interventions   Education Provided Provided Education  [During phone call patient is very short of breath. I have encouraged patient to call 911 or go to the hospital and she doesnot want to at this time. Message sent to MD.]      08/14/2022   Appointment with GI scheduled and patient denies any additional concerns.  Encouraged patient to call me in the future if she needs any assistance.         SDOH assessments and interventions completed:  No     Care Coordination Interventions:  Yes, provided   Follow up plan: No further intervention required.   Encounter Outcome:  Pt. Visit Completed   Rowe Pavy, RN, BSN, CEN [facility] Coordinator [phone]

## 2022-08-15 ENCOUNTER — Encounter: Payer: Self-pay | Admitting: Family Medicine

## 2022-08-15 ENCOUNTER — Ambulatory Visit (INDEPENDENT_AMBULATORY_CARE_PROVIDER_SITE_OTHER): Payer: Medicare Other | Admitting: Family Medicine

## 2022-08-15 VITALS — BP 96/43 | HR 78 | Temp 97.4°F | Ht 64.0 in | Wt 108.0 lb

## 2022-08-15 DIAGNOSIS — I119 Hypertensive heart disease without heart failure: Secondary | ICD-10-CM

## 2022-08-15 DIAGNOSIS — R0602 Shortness of breath: Secondary | ICD-10-CM | POA: Diagnosis not present

## 2022-08-15 DIAGNOSIS — R1013 Epigastric pain: Secondary | ICD-10-CM | POA: Diagnosis not present

## 2022-08-15 MED ORDER — CLONIDINE HCL 0.1 MG PO TABS: 0.1000 mg | ORAL_TABLET | Freq: Every day | ORAL | 2 refills | Status: DC | PRN

## 2022-08-15 NOTE — Progress Notes (Signed)
Acute Office Visit  Subjective:    Patient ID: Leah Pittman, female    DOB: [birth year], 89 y.o.   MRN: [id number]  Chief Complaint  Patient presents with   Hypertension   Abdominal Pain    HPI: Patient is in today for sob. No chest pain. Hoarse, but improved from earlier today. Dizzy and nauseated. No vomiting. No diarrhea or constipation. Continued abdominal pain. Burning and aching. Tums do not help. Worse at night. DOE and orthopnea.  115/86. Low over the last several days. Has not needed bp medicine. This afternoon developed a headache. Rechecked sbp 170/81. 1 hour later 190/86. Took olmesartan 20mg  once daily.   Past Medical History:  Diagnosis Date   Anxiety    Arthritis    Chest pain 2011   CARDIOLITE - no significant symptoms, EKG changes, or arrhythmias   Congenital prolapse of bladder mucosa    Diverticulosis    Dyspnea 02/16/2012   2D ECHO - EF 55-60%, normal   Esophageal dysphagia    Fatigue    GERD (gastroesophageal reflux disease)    Glaucoma    Headache(784.0)    Heart attack (HCC)    Hiatal hernia    High blood pressure 02/16/2012   RENAL DOPPLER - normal   Hyperlipidemia    Hypothyroidism    Labile hypertension    Lightheadedness    Migraine headache    Multiple allergies    Myalgia    NSTEMI (non-ST elevated myocardial infarction) (HCC) 07/14/2020   OSA (obstructive sleep apnea)    Pain, lower leg    Calf   Problem of menstruation    Proteinuria    Reflux    SOB (shortness of breath)    Thyroid disease    Urinary problem    Valvular heart disease    Vitamin B12 deficiency    Vitamin D deficiency     Past Surgical History:  Procedure Laterality Date   ABDOMINAL HYSTERECTOMY  1976   CHOLECYSTECTOMY  2000   COLONOSCOPY  07/27/2014   Moderate predominantly sigmoid divertuculosis. Otherwise normal colonoscopy   CORONARY ARTERY BYPASS GRAFT N/A 07/10/2018   Procedure: CORONARY ARTERY BYPASS GRAFTING (CABG), FREE LIMA;  Surgeon: [name], MD;  Location: [facility] OR;  Service: Open Heart Surgery;  Laterality: N/A;   CYST REMOVAL NECK     CYSTECTOMY  1974   Intestine   CYSTECTOMY  1996   Brain stem   ESOPHAGOGASTRODUODENOSCOPY  12/07/2016   Schatzki's ring status post esophageal dilatation. Small hiatal hernia. Mild gastritis   EYE SURGERY  2003   EYE SURGERY  07/2016   LEFT HEART CATH AND CORONARY ANGIOGRAPHY N/A 07/08/2018   Procedure: LEFT HEART CATH AND CORONARY ANGIOGRAPHY;  Surgeon: Runell Gess, MD;  Location: [facility];  Service: Cardiovascular;  Laterality: N/A;   LEFT HEART CATH AND CORONARY ANGIOGRAPHY N/A 07/25/2019   Procedure: LEFT HEART CATH AND CORONARY ANGIOGRAPHY;  Surgeon: Kathleene Hazel, MD;  Location: [facility];  Service: Cardiovascular;  Laterality: N/A;   MOUTH SURGERY  2013   RADIOLOGY WITH ANESTHESIA N/A 11/05/2019   Procedure: MRI WITH ANESTHESIA;  Surgeon: Radiologist, Medication, MD;  Location: [facility] OR;  Service: Radiology;  Laterality: N/A;   TEE WITHOUT CARDIOVERSION N/A 07/10/2018   Procedure: TRANSESOPHAGEAL ECHOCARDIOGRAM (TEE);  Surgeon: [name], MD;  Location: [facility] OR;  Service: Open Heart Surgery;  Laterality: N/A;   TONSILLECTOMY     age 36    Family  History  Problem Relation Age of Onset   Other Mother        blood disorder - unsure of name   Heart attack Father    Stroke Father    Cancer Sister    Cancer Brother    Asthma Brother    Cancer Brother     Social History   Socioeconomic History   Marital status: Married    Spouse name: Not on file   Number of children: 3   Years of education: college   Highest education level: Not on file  Occupational History   Occupation: Retired  Tobacco Use   Smoking status: Never   Smokeless tobacco: Never  Vaping Use   Vaping Use: Never used  Substance and Sexual Activity   Alcohol use: No   Drug use: No   Sexual activity: Not on file  Other Topics Concern   Not on file  Social History  Narrative   Lives with husband in [place], [place].   Right-handed.   No daily caffeine use.   Social Determinants of Health   Financial Resource Strain: Medium Risk (10/19/2021)   Overall Financial Resource Strain (CARDIA)    Difficulty of Paying Living Expenses: Somewhat hard  Food Insecurity: No Food Insecurity (10/19/2021)   Hunger Vital Sign    Worried About Running Out of Food in the Last Year: Never true    Ran Out of Food in the Last Year: Never true  Transportation Needs: No Transportation Needs (05/09/2022)   PRAPARE - Administrator, Civil Service (Medical): No    Lack of Transportation (Non-Medical): No  Physical Activity: Inactive (05/09/2022)   Exercise Vital Sign    Days of Exercise per Week: 0 days    Minutes of Exercise per Session: 0 min  Stress: No Stress Concern Present (05/11/2021)   [employer] of Occupational Health - Occupational Stress Questionnaire    Feeling of Stress : Only a little  Social Connections: Moderately Integrated (04/19/2022)   Social Connection and Isolation Panel [NHANES]    Frequency of Communication with Friends and Family: Once a week    Frequency of Social Gatherings with Friends and Family: Twice a week    Attends Religious Services: More than 4 times per year    Active Member of [employer] or Organizations: No    Attends Banker Meetings: Never    Marital Status: Married  Catering manager Violence: Not At Risk (05/11/2021)   Humiliation, Afraid, Rape, and Kick questionnaire    Fear of Current or Ex-Partner: No    Emotionally Abused: No    Physically Abused: No    Sexually Abused: No    Outpatient Medications Prior to Visit  Medication Sig Dispense Refill   aspirin 81 MG tablet Take 81 mg by mouth daily.     Budeson-Glycopyrrol-Formoterol (BREZTRI AEROSPHERE) 160-9-4.8 MCG/ACT AERO Inhale 2 puffs into the lungs 2 (two) times daily as needed (for flares). 10.7 g 11   Cholecalciferol (VITAMIN D3 SUPER STRENGTH) 50  MCG (2000 UT) CAPS Take 2,000 Units by mouth daily after lunch.     dorzolamide-timolol (COSOPT) 22.3-6.8 MG/ML ophthalmic solution Place 1 drop into the left eye 2 (two) times daily.      latanoprost (XALATAN) 0.005 % ophthalmic solution Place 1 drop into the left eye at bedtime.     olmesartan (BENICAR) 20 MG tablet Take 20 mg by mouth daily.     Vonoprazan Fumarate (VOQUEZNA) 20 MG TABS Take 20 mg by  mouth daily. (Patient not taking: Reported on 07/20/2022) 20 tablet 0   XELPROS 0.005 % EMUL Place 1 drop into the left eye at bedtime.     levothyroxine (SYNTHROID) 100 MCG tablet Take 1 tablet (100 mcg total) by mouth daily before breakfast. 30 tablet 2   No facility-administered medications prior to visit.    Allergies  Allergen Reactions   Benadryl [Diphenhydramine] Swelling and Other (See Comments)    Tongue swells and hallucinates- could not talk   Lipase Concentrate-Hp [Digestive Enzymes] Rash   Zenpep [Pancrelipase (Lip-Prot-Amyl)] Rash   Codeine Nausea And Vomiting and Hypertension   Meperidine Nausea Only, Swelling and Other (See Comments)    Tongue swells and "I feel like death"   Other Other (See Comments)    SKIN IS VERY SENSITIVE AND IT TEARS EASILY!!   Prednisone Hypertension   Promethazine Other (See Comments)    Hypotension    Propranolol Nausea And Vomiting   Shellfish Allergy Nausea And Vomiting   Tape Other (See Comments)    Tears the skin   Tazarotene Other (See Comments)    Reaction not recalled- "Tazarotene, commonly marketed as Tazorac, Avage, and Zorac, is member of the acetylenic class of retinoids."    Iodinated Contrast Media Nausea Only and Rash    Review of Systems  Constitutional:  Positive for fatigue. Negative for chills and fever.  HENT:  Negative for congestion, ear pain and sore throat.   Respiratory:  Positive for shortness of breath. Negative for cough.   Cardiovascular:  Negative for chest pain.  Gastrointestinal:  Positive for  abdominal pain and nausea. Negative for constipation, diarrhea and vomiting.  Skin:  Negative for rash.  Neurological:  Positive for dizziness and headaches.  Psychiatric/Behavioral:  Negative for dysphoric mood. The patient is not nervous/anxious.        Objective:        08/15/2022    4:06 PM 08/15/2022    3:48 PM 08/15/2022    3:47 PM  Vitals with BMI  Systolic 96 150 124  Diastolic 43 72 60   blood pressure 190/76 when arrived. Repeat 170/70.  Patient was given clonidine in the office and came down to 150/72 took second clonidine 0.1 mg. Patient became dizzy. Lightheaded. Repeat bp 96/43. I laid patient back and put her head up. Pushed fluids. R Repeat BP 98/40.  No data found.    Physical Exam Vitals reviewed.  Constitutional:      Appearance: Normal appearance. She is normal weight.  Cardiovascular:     Rate and Rhythm: Normal rate and regular rhythm.     Heart sounds: Normal heart sounds.  Pulmonary:     Effort: Pulmonary effort is normal. No respiratory distress.     Breath sounds: Normal breath sounds.  Abdominal:     General: Abdomen is flat. Bowel sounds are normal.     Palpations: Abdomen is soft.     Tenderness: There is abdominal tenderness (tender epigastric.).  Neurological:     Mental Status: She is alert.  Psychiatric:        Mood and Affect: Mood normal.        Behavior: Behavior normal.     Health Maintenance Due  Topic Date Due   Zoster Vaccines- Shingrix (1 of 2) Never done   Pneumonia Vaccine 80+ Years old (1 of 1 - PCV) Never done   Medicare Annual Wellness (AWV)  05/12/2022    There are no preventive care reminders to display for this  patient.   Lab Results  Component Value Date   TSH 0.211 (L) 08/15/2022   Lab Results  Component Value Date   WBC 6.9 08/15/2022   HGB 12.5 08/15/2022   HCT 36.4 08/15/2022   MCV 93 08/15/2022   PLT 216 08/15/2022   Lab Results  Component Value Date   NA 138 08/15/2022   K 4.7 08/15/2022    CO2 23 08/15/2022   GLUCOSE 108 (H) 08/15/2022   BUN 16 08/15/2022   CREATININE 0.71 08/15/2022   BILITOT 0.4 08/15/2022   ALKPHOS 73 08/15/2022   AST 21 08/15/2022   ALT 18 08/15/2022   PROT 6.7 08/15/2022   ALBUMIN 4.3 08/15/2022   CALCIUM 10.2 08/15/2022   ANIONGAP 7 01/05/2021   EGFR 81 08/15/2022   GFR 60.94 01/27/2020   Lab Results  Component Value Date   CHOL 170 06/29/2022   Lab Results  Component Value Date   HDL 73 06/29/2022   Lab Results  Component Value Date   LDLCALC 79 06/29/2022   Lab Results  Component Value Date   TRIG 104 06/29/2022   Lab Results  Component Value Date   CHOLHDL 2.3 06/29/2022   Lab Results  Component Value Date   HGBA1C 5.9 (H) 12/08/2021       Assessment & Plan:  Hypertension with heart disease Assessment & Plan: If systolic bp (upper number) is greater than 150/90, recommend take olmesartan 20 mg x 1.  Recommend if continue systolic bp > 150/90 take clonidine 0.1 mg x 1.  Checked labs Ordered EKG  Orders: -     EKG 12-Lead -     CBC with Differential/Platelet -     Comprehensive metabolic panel -     cloNIDine HCl; Take 1 tablet (0.1 mg total) by mouth daily as needed (bp > 150/90.).  Dispense: 30 tablet; Refill: 2  Shortness of breath Assessment & Plan: Check labs  Orders: -     Pro b natriuretic peptide (BNP)  Epigastric abdominal pain Assessment & Plan: Check labs.  Orders: -     Lipase -     Amylase     Meds ordered this encounter  Medications   cloNIDine (CATAPRES) 0.1 MG tablet    Sig: Take 1 tablet (0.1 mg total) by mouth daily as needed (bp > 150/90.).    Dispense:  30 tablet    Refill:  2    Orders Placed This Encounter  Procedures   CBC with Differential/Platelet   Comprehensive metabolic panel   Pro b natriuretic peptide (BNP)   Lipase   Amylase   EKG 12-Lead     Follow-up: No follow-ups on file.  An After Visit Summary was printed and given to the patient.  Total time spent  on today's visit was greater than 120 minutes, including both face-to-face time and nonface-to-face time personally spent on review of chart (labs and imaging), discussing labs and goals, discussing further work-up, treatment options, referrals to specialist if needed, reviewing outside records of pertinent, answering patient's questions, and coordinating care.    Clayborn Bigness I Leal-Borjas,acting as a scribe for Blane Ohara, MD.,have documented all relevant documentation on the behalf of Blane Ohara, MD,as directed by  Blane Ohara, MD while in the presence of Blane Ohara, MD.   I attest that I have reviewed this visit and agree with the plan scribed by my staff.   Blane Ohara, MD [facility] [phone]

## 2022-08-15 NOTE — Patient Instructions (Addendum)
If systolic bp (upper number) is greater than 150/90, recommend take olmesartan 20 mg x 1.  Recommend if continue systolic bp > 150/90 take clonidine 0.1 mg x 1.  If persistent elevated bp, call our office.

## 2022-08-16 ENCOUNTER — Other Ambulatory Visit: Payer: Self-pay

## 2022-08-16 DIAGNOSIS — E039 Hypothyroidism, unspecified: Secondary | ICD-10-CM

## 2022-08-16 LAB — CBC WITH DIFFERENTIAL/PLATELET
Basophils Absolute: 0.1 10*3/uL (ref 0.0–0.2)
Basos: 1 %
EOS (ABSOLUTE): 0.2 10*3/uL (ref 0.0–0.4)
Eos: 3 %
Hematocrit: 36.4 % (ref 34.0–46.6)
Hemoglobin: 12.5 g/dL (ref 11.1–15.9)
Immature Grans (Abs): 0 10*3/uL (ref 0.0–0.1)
Immature Granulocytes: 0 %
Lymphocytes Absolute: 1.2 10*3/uL (ref 0.7–3.1)
Lymphs: 17 %
MCH: 31.8 pg (ref 26.6–33.0)
MCHC: 34.3 g/dL (ref 31.5–35.7)
MCV: 93 fL (ref 79–97)
Monocytes Absolute: 0.7 10*3/uL (ref 0.1–0.9)
Monocytes: 10 %
Neutrophils Absolute: 4.7 10*3/uL (ref 1.4–7.0)
Neutrophils: 69 %
Platelets: 216 10*3/uL (ref 150–450)
RBC: 3.93 x10E6/uL (ref 3.77–5.28)
RDW: 14.1 % (ref 11.7–15.4)
WBC: 6.9 10*3/uL (ref 3.4–10.8)

## 2022-08-16 LAB — PRO B NATRIURETIC PEPTIDE: NT-Pro BNP: 470 pg/mL (ref 0–738)

## 2022-08-16 LAB — COMPREHENSIVE METABOLIC PANEL
ALT: 18 IU/L (ref 0–32)
AST: 21 IU/L (ref 0–40)
Albumin: 4.3 g/dL (ref 3.7–4.7)
Alkaline Phosphatase: 73 IU/L (ref 44–121)
BUN/Creatinine Ratio: 23 (ref 12–28)
BUN: 16 mg/dL (ref 8–27)
Bilirubin Total: 0.4 mg/dL (ref 0.0–1.2)
CO2: 23 mmol/L (ref 20–29)
Calcium: 10.2 mg/dL (ref 8.7–10.3)
Chloride: 102 mmol/L (ref 96–106)
Creatinine, Ser: 0.71 mg/dL (ref 0.57–1.00)
Globulin, Total: 2.4 g/dL (ref 1.5–4.5)
Glucose: 108 mg/dL — ABNORMAL HIGH (ref 70–99)
Potassium: 4.7 mmol/L (ref 3.5–5.2)
Sodium: 138 mmol/L (ref 134–144)
Total Protein: 6.7 g/dL (ref 6.0–8.5)
eGFR: 81 mL/min/{1.73_m2} (ref >59)

## 2022-08-16 LAB — AMYLASE: Amylase: 85 U/L (ref 31–110)

## 2022-08-16 LAB — LIPASE: Lipase: 48 U/L (ref 14–85)

## 2022-08-16 LAB — TSH: TSH: 0.211 u[IU]/mL — ABNORMAL LOW (ref 0.450–4.500)

## 2022-08-16 MED ORDER — LEVOTHYROXINE SODIUM 88 MCG PO TABS: 88.0000 ug | ORAL_TABLET | Freq: Every day | ORAL | 3 refills | Status: DC

## 2022-08-17 ENCOUNTER — Telehealth: Payer: Self-pay

## 2022-08-17 NOTE — Telephone Encounter (Signed)
Called patient with lab results, during conversation she stated she spoke with someone from  our office yesterday regarding still having a low bp readings. Today it was 92/54. Stated that since her visit here she has not been able to get her bp up. Also stated she is drinking tomato soup with hopes of it aiding in bringing her bp up (this was also suggested by someone at our office). Do not see a note regarding this.

## 2022-08-17 NOTE — Telephone Encounter (Signed)
Patient aware, stated she has not taken her bp medication since her last visit here. Aware to push fluids, rest and continue to monitor bp. Aware if bp continued to drop go to ED. Patient verbalized understanding.

## 2022-08-19 NOTE — Assessment & Plan Note (Signed)
Check labs 

## 2022-08-19 NOTE — Assessment & Plan Note (Addendum)
If systolic bp (upper number) is greater than 150/90, recommend take olmesartan 20 mg x 1.  Recommend if continue systolic bp > 150/90 take clonidine 0.1 mg x 1.  Checked labs Ordered EKG

## 2022-08-19 NOTE — Assessment & Plan Note (Signed)
>>  ASSESSMENT AND PLAN FOR HYPERTENSION WITH HEART DISEASE WRITTEN ON 08/19/2022 10:56 AM BY LEAL-BORJAS, Hadyn Blanck I, CMA  If systolic bp (upper number) is greater than 150/90, recommend take olmesartan  20 mg x 1.  Recommend if continue systolic bp > 150/90 take clonidine  0.1 mg x 1.  Checked labs Ordered EKG

## 2022-08-21 ENCOUNTER — Telehealth: Payer: Self-pay

## 2022-08-21 NOTE — Telephone Encounter (Signed)
Contacted Leah Pittman to schedule their annual wellness visit. Patient declined to schedule AWV at this time.

## 2022-09-13 ENCOUNTER — Encounter: Payer: Self-pay | Admitting: Cardiovascular Disease

## 2022-09-13 ENCOUNTER — Ambulatory Visit: Payer: Medicare Other | Admitting: Cardiovascular Disease

## 2022-09-13 VITALS — BP 172/70 | HR 60 | Ht 64.0 in | Wt 107.6 lb

## 2022-09-13 DIAGNOSIS — I25118 Atherosclerotic heart disease of native coronary artery with other forms of angina pectoris: Secondary | ICD-10-CM

## 2022-09-13 DIAGNOSIS — R079 Chest pain, unspecified: Secondary | ICD-10-CM | POA: Insufficient documentation

## 2022-09-13 DIAGNOSIS — I700 Atherosclerosis of aorta: Secondary | ICD-10-CM | POA: Diagnosis present

## 2022-09-13 DIAGNOSIS — I129 Hypertensive chronic kidney disease with stage 1 through stage 4 chronic kidney disease, or unspecified chronic kidney disease: Secondary | ICD-10-CM | POA: Insufficient documentation

## 2022-09-13 DIAGNOSIS — E7800 Pure hypercholesterolemia, unspecified: Secondary | ICD-10-CM | POA: Diagnosis present

## 2022-09-13 DIAGNOSIS — Z8679 Personal history of other diseases of the circulatory system: Secondary | ICD-10-CM | POA: Diagnosis not present

## 2022-09-13 MED ORDER — ROSUVASTATIN CALCIUM 20 MG PO TABS: 20.0000 mg | ORAL_TABLET | Freq: Every evening | ORAL | 3 refills | Status: DC

## 2022-09-13 NOTE — Progress Notes (Signed)
Cardiology Office Note    Date:  09/17/2022   ID:  Leah Pittman, DOB [birth year], MRN [id number]  PCP:  Blane Ohara, MD  Cardiologist:   Thurmon Fair, MD   No chief complaint on file.   History of Present Illness:  Leah Pittman is a 89 y.o. female with HTN and severe hypercholesterolemia, labile HTN and multivessel CAD s/p CABG (07/10/2018, [place], LIMA to LAD, SVG to diagonal, sequential SVG to OM1+OM 2), complicated by transient postoperative atrial fibrillation. Her husband Leah Pittman who is also my patient.  She is not feeling well.  She complains of chest pain off and on for weeks.  For many years she has had a variety of different types of atypical chest pain, but today she seems to be describing the dull low retrosternal/epigastric discomfort that she had as a precursor to her bypass surgery.  The chest pain is not exertional, but fairly random.    She had an angiogram in June 2021, about a year after her bypass procedure.  This showed a moderate-severe ostial left main stenosis with severe stenosis in the mid LAD and a patent SVG to the diagonal artery that fills the LAD, as well as severe stenosis of the mid circumflex coronary artery, patent sequential SVG to OM1-OM 2, mild disease in the mid RCA.  The LIMA was a atretic.  She's kept a record of her blood pressure readings and these are highly variable, sometimes very high.  It seems that her episodes of chest pain may have some correlation with periods of high blood pressure, although this is not entirely clear.  She did have a renal duplex ultrasound looking for renal artery stenosis in 2013 and there was no problem at that time.  She has lost a lot of weight.  Her appetite is poor.  BMI is now less than 19.  She remains very sedentary.  Continues to have a chronic cough.  Denies orthopnea, PND, lower extremity edema, claudication or new focal neurological complaints.  Her vision is very poor.  She has muscle cramps at night in  her legs.  She is chronically constipated.  ECG performed a couple weeks ago showed normal sinus rhythm, nonspecific T wave inversion leads V1-V2 unchanged from previous tracings.  QTc 440 ms.  Her most recent LDL cholesterol was borderline acceptable at 79, although she had an excellent HDL at 73.  She is currently off her rosuvastatin to see if her multiple complaints are improved.  They have not improved.  The echocardiogram performed 11/17/2021 was a normal study.   She had a barium swallow that appeared to show normal findings.  She has had a cholecystectomy.  In 2013 renal artery duplex did not show evidence of renal artery stenosis.  Pre-bypass carotid scan showed no evidence of significant stenoses and there was antegrade flow in both vertebral arteries.  She has normal left ventricular systolic function and has diastolic dysfunction which appears appropriate for age.  Past Medical History:  Diagnosis Date   Anxiety    Arthritis    Chest pain 2011   CARDIOLITE - no significant symptoms, EKG changes, or arrhythmias   Congenital prolapse of bladder mucosa    Diverticulosis    Dyspnea 02/16/2012   2D ECHO - EF 55-60%, normal   Esophageal dysphagia    Fatigue    GERD (gastroesophageal reflux disease)    Glaucoma    Headache(784.0)    Heart attack (HCC)    Hiatal hernia  High blood pressure 02/16/2012   RENAL DOPPLER - normal   Hyperlipidemia    Hypothyroidism    Labile hypertension    Lightheadedness    Migraine headache    Multiple allergies    Myalgia    NSTEMI (non-ST elevated myocardial infarction) (HCC) 07/14/2020   OSA (obstructive sleep apnea)    Pain, lower leg    Calf   Problem of menstruation    Proteinuria    Reflux    SOB (shortness of breath)    Thyroid disease    Urinary problem    Valvular heart disease    Vitamin B12 deficiency    Vitamin D deficiency     Past Surgical History:  Procedure Laterality Date   ABDOMINAL HYSTERECTOMY  1976    CHOLECYSTECTOMY  2000   COLONOSCOPY  07/27/2014   Moderate predominantly sigmoid divertuculosis. Otherwise normal colonoscopy   CORONARY ARTERY BYPASS GRAFT N/A 07/10/2018   Procedure: CORONARY ARTERY BYPASS GRAFTING (CABG), FREE LIMA;  Surgeon: [name], MD;  Location: [facility] OR;  Service: Open Heart Surgery;  Laterality: N/A;   CYST REMOVAL NECK     CYSTECTOMY  1974   Intestine   CYSTECTOMY  1996   Brain stem   ESOPHAGOGASTRODUODENOSCOPY  12/07/2016   Schatzki's ring status post esophageal dilatation. Small hiatal hernia. Mild gastritis   EYE SURGERY  2003   EYE SURGERY  07/2016   LEFT HEART CATH AND CORONARY ANGIOGRAPHY N/A 07/08/2018   Procedure: LEFT HEART CATH AND CORONARY ANGIOGRAPHY;  Surgeon: Runell Gess, MD;  Location: [facility] INVASIVE [facility];  Service: Cardiovascular;  Laterality: N/A;   LEFT HEART CATH AND CORONARY ANGIOGRAPHY N/A 07/25/2019   Procedure: LEFT HEART CATH AND CORONARY ANGIOGRAPHY;  Surgeon: Kathleene Hazel, MD;  Location: [facility] IN[facility];  Service: Cardiovascular;  Laterality: N/A;   MOUTH SURGERY  2013   RADIOLOGY WITH ANESTHESIA N/A 11/05/2019   Procedure: MRI WITH ANESTHESIA;  Surgeon: Radiologist, Medication, MD;  Location: [facility] OR;  Service: Radiology;  Laterality: N/A;   TEE WITHOUT CARDIOVERSION N/A 07/10/2018   Procedure: TRANSESOPHAGEAL ECHOCARDIOGRAM (TEE);  Surgeon: [name], MD;  Location[facility];  Service: Open Heart Surgery;  Laterality: N/A;   TONSILLECTOMY     age 73    Current Medications: Outpatient Medications Prior to Visit  Medication Sig Dispense Refill   aspirin 81 MG tablet Take 81 mg by mouth daily.     Budeson-Glycopyrrol-Formoterol (BREZTRI AEROSPHERE) 160-9-4.8 MCG/ACT AERO Inhale 2 puffs into the lungs 2 (two) times daily as needed (for flares). 10.7 g 11   Cholecalciferol (VITAMIN D3 SUPER STRENGTH) 50 MCG (2000 UT) CAPS Take 1,000 Units by mouth daily after lunch.     cloNIDine (CATAPRES) 0.1 MG tablet Take 1  tablet (0.1 mg total) by mouth daily as needed (bp > 150/90.). 30 tablet 2   dorzolamide-timolol (COSOPT) 22.3-6.8 MG/ML ophthalmic solution Place 1 drop into the left eye 2 (two) times daily.      latanoprost (XALATAN) 0.005 % ophthalmic solution Place 1 drop into the left eye at bedtime.     levothyroxine (SYNTHROID) 88 MCG tablet Take 1 tablet (88 mcg total) by mouth daily. 90 tablet 3   olmesartan (BENICAR) 20 MG tablet Take 20 mg by mouth daily.     Vonoprazan Fumarate (VOQUEZNA) 20 MG TABS Take 20 mg by mouth daily. (Patient not taking: Reported on 09/13/2022) 20 tablet 0   XELPROS 0.005 % EMUL Place 1 drop into the left  eye at bedtime. (Patient not taking: Reported on 09/13/2022)     No facility-administered medications prior to visit.     Allergies:   Benadryl [diphenhydramine], Lipase concentrate-hp [digestive enzymes], Zenpep [pancrelipase (lip-prot-amyl)], Codeine, Meperidine, Other, Prednisone, Promethazine, Propranolol, Shellfish allergy, Tape, Tazarotene, and Iodinated contrast media   Social History   Socioeconomic History   Marital status: Married    Spouse name: Not on file   Number of children: 3   Years of education: college   Highest education level: Not on file  Occupational History   Occupation: Retired  Tobacco Use   Smoking status: Never   Smokeless tobacco: Never  Vaping Use   Vaping status: Never Used  Substance and Sexual Activity   Alcohol use: No   Drug use: No   Sexual activity: Not on file  Other Topics Concern   Not on file  Social History Narrative   Lives with husband in [place], [place].   Right-handed.   No daily caffeine use.   Social Determinants of Health   Financial Resource Strain: Medium Risk (10/19/2021)   Overall Financial Resource Strain (CARDIA)    Difficulty of Paying Living Expenses: Somewhat hard  Food Insecurity: No Food Insecurity (10/19/2021)   Hunger Vital Sign    Worried About Running Out of Food in the Last Year: Never true     Ran Out of Food in the Last Year: Never true  Transportation Needs: No Transportation Needs (05/09/2022)   PRAPARE - Administrator, Civil Service (Medical): No    Lack of Transportation (Non-Medical): No  Physical Activity: Inactive (05/09/2022)   Exercise Vital Sign    Days of Exercise per Week: 0 days    Minutes of Exercise per Session: 0 min  Stress: No Stress Concern Present (05/11/2021)   [employer] of Occupational Health - Occupational Stress Questionnaire    Feeling of Stress : Only a little  Social Connections: Moderately Integrated (04/19/2022)   Social Connection and Isolation Panel [NHANES]    Frequency of Communication with Friends and Family: Once a week    Frequency of Social Gatherings with Friends and Family: Twice a week    Attends Religious Services: More than 4 times per year    Active Member of [employer] or Organizations: No    Attends Engineer, structural: Never    Marital Status: Married     Family History:  The patient's family history includes Asthma in her brother; Cancer in her brother, brother, and sister; Heart attack in her father; Other in her mother; Stroke in her father.   ROS:   Please see the history of present illness.    ROS All other systems reviewed and are negative.   PHYSICAL EXAM:   VS:  BP (!) 172/70 (BP Location: Left Arm, Patient Position: Sitting, Cuff Size: Small)   Pulse 60   Ht 5\' 4"  (1.626 m)   Wt 107 lb 9.6 oz (48.8 kg)   SpO2 98%   BMI 18.47 kg/m     General: Alert, oriented x3, no distress, elderly, frail, very slender Head: no evidence of trauma, PERRL, EOMI, no exophtalmos or lid lag, no myxedema, no xanthelasma; normal ears, nose and oropharynx Neck: normal jugular venous pulsations and no hepatojugular reflux; brisk carotid pulses without delay and no carotid bruits Chest: clear to auscultation, no signs of consolidation by percussion or palpation, normal fremitus, symmetrical and full respiratory  excursions Cardiovascular: normal position and quality of the apical impulse, regular rhythm,  normal first and second heart sounds, no murmurs, rubs or gallops Abdomen: no tenderness or distention, no masses by palpation, no abnormal pulsatility or arterial bruits, normal bowel sounds, no hepatosplenomegaly Extremities: no clubbing, cyanosis or edema; 2+ radial, ulnar and brachial pulses bilaterally; 2+ right femoral, posterior tibial and dorsalis pedis pulses; 2+ left femoral, posterior tibial and dorsalis pedis pulses; no subclavian or femoral bruits Neurological: grossly nonfocal Psych: Normal mood and affect      Wt Readings from Last 3 Encounters:  09/13/22 107 lb 9.6 oz (48.8 kg)  08/15/22 108 lb (49 kg)  07/21/22 108 lb (49 kg)    Studies/Labs Reviewed:  CT Chest 05-25-2020 Small sliding-type hiatal hernia. Calcified right hilar lymph nodes are noted consistent with prior granulomatous disease. Bilateral scarring and bronchiectasis is noted. No acute pulmonary abnormality is noted. Aortic Atherosclerosis   ECHO 11/17/2021  1. Left ventricular ejection fraction, by estimation, is 60 to 65%. The  left ventricle has normal function. The left ventricle has no regional  wall motion abnormalities. Left ventricular diastolic parameters were  normal.   2. Right ventricular systolic function is normal. The right ventricular  size is normal.   3. The mitral valve is normal in structure. Trivial mitral valve  regurgitation. No evidence of mitral stenosis.   4. The aortic valve is tricuspid. Aortic valve regurgitation is mild.  Aortic valve sclerosis is present, with no evidence of aortic valve  stenosis.   5. The inferior vena cava is normal in size with greater than 50%  respiratory variability, suggesting right atrial pressure of 3 mmHg.   Cardiac catheterization 07/25/2019  Prox RCA to Mid RCA lesion is 40% stenosed. Ost LM to Mid LM lesion is 60% stenosed. Mid Cx lesion is  99% stenosed. SVG graft was visualized by angiography and is normal in caliber. The graft exhibits no disease. SVG graft was visualized by angiography and is normal in caliber. The graft exhibits no disease. Mid LAD lesion is 70% stenosed.   Severe double vessel CAD Moderately severe ostial left main stenosis Severe stenosis mid LAD. The SVG to the LAD is patent. The LIMA does not appear to have been used despite the findings in the operative report. The LIMA is small and atretic.  Severe stenosis mid Circumflex. OM and OM2 fill from the patent vein graft.  Mild disease mid RCA   Recommendations: Continue medical management of CAD. Will start lactulose for constipation. She could be discharged later today after bedrest if she is stable.   Dominance: Right    EKG:  EKG is not ordered today.  It shows normal sinus rhythm and is a completely normal tracing.  Personally reviewed the tracing from 08/15/2022 which showed normal sinus rhythm, nonspecific T wave inversion V1-V2, no Q waves no ST segment changes, unchanged from previous tracings, QTc 440 ms.  Recent Labs: 09/23/2018 Total cholesterol 170, HDL 64, LDL 90, triglycerides 185 Hemoglobin A1c 5.8% Hemoglobin 12.0, creatinine 0.84, potassium 4.3, normal TSH and liver function tests  01/21/2019 Total cholesterol 164, HDL 69, LDL 69, triglycerides 90 Hemoglobin A1c 5.7%, hemoglobin 12.6, creatinine 0.88, potassium 5.1, normal liver function tests, TSH 0.441   11/04/2019 Total cholesterol 206, HDL 69, LDL 120, triglycerides 86 09/26/5782 Hemoglobin 12.4, creatinine 0.94, potassium 4.8, magnesium 2.2, normal liver function test, TSH 0.87  12/08/2021 Cholesterol 180, HDL 76, LDL 90, triglycerides 75 Hemoglobin A1c 5.9% Hemoglobin 12.1 Creatinine 0.97, potassium 5.1, ALT 16  06/29/2022 Cholesterol 170, HDL 73, LDL 79, triglycerides 104  08/15/2022 Hemoglobin 12.5, creatinine 0.71, potassium 4.7, normal liver function test, TSH  0.211  ASSESSMENT:    1. Coronary artery disease involving native coronary artery of native heart with other form of angina pectoris (HCC)   2. Chest pain of uncertain etiology   3. History of atrial fibrillation   4. Renal hypertension   5. Hypercholesterolemia   6. Atherosclerosis of aorta (HCC)       PLAN:  In order of problems listed above:  CAD s/p CABG: Chest pain has always been difficult to diagnose in Leah Pittman's case.  However the way she is positioning her hands over her epigastric area and pushing down is very similar to the way she described her angina before she underwent bypass surgery.  Will schedule her for a nuclear perfusion study.  Reportedly she had an atretic LIMA, but had good flow to the LAD from the diagonal graft when she had a cardiac catheterization 2021.  Echocardiogram performed less than a year ago shows normal systolic and diastolic function. Postop AFib: This only occurred in the immediate postop period and has not recurred.  Not on anticoagulation. HTN: Blood sugar control is highly variable.  Recheck for renal artery stenosis.  For a while she took Voquezna and I wonder whether this had something to do with her spikes in blood pressure.  She is not taking it anymore. HLP: Even though LDL cholesterol is not less than 70, her baseline untreated LDL cholesterol is 163 so this is well over a 50% reduction.  Continue on rosuvastatin. Atherosclerosis of the aorta: Normal caliber aorta.      Medication Adjustments/Labs and Tests Ordered: Current medicines are reviewed at length with the patient today.  Concerns regarding medicines are outlined above.  Medication changes, Labs and Tests ordered today are listed in the Patient Instructions below. Patient Instructions  Medication Instructions:  Rosuvastatin 20 mg every evening *If you need a refill on your cardiac medications before your next appointment, please call your  pharmacy*  Testing/Procedures: Dr Royann Shivers has ordered a Lexiscan Myocardial Perfusion Imaging Study.  Please arrive 15 minutes prior to your appointment time for registration and insurance purposes.   The test will take approximately 3 to 4 hours to complete; you may bring reading material.  If someone comes with you to your appointment, they will need to remain in the main lobby due to limited space in the testing area. **If you are pregnant or breastfeeding, please notify the nuclear lab prior to your appointment**   How to prepare for your Myocardial Perfusion Test: Do not eat or drink 3 hours prior to your test, except you may have water. Do not consume products containing caffeine (regular or decaffeinated) 12 hours prior to your test. (ex: coffee, chocolate, sodas, tea). Do wear comfortable clothes (no dresses or overalls) and walking shoes, tennis shoes preferred (No heels or open toe shoes are allowed). Do NOT wear cologne, perfume, aftershave, or lotions (deodorant is allowed). If you use an inhaler, use it the AM of your test and bring it with you.  If you use a nebulizer, use it the AM of your test.  If these instructions are not followed, your test will have to be rescheduled.   Your physician has requested that you have a renal artery duplex. During this test, an ultrasound is used to evaluate blood flow to the kidneys. Allow one hour for this exam. Do not eat after midnight the day before and avoid carbonated beverages. Take your  medications as you usually do.    Follow-Up: At [facility], you and your health needs are our priority.  As part of our continuing mission to provide you with exceptional heart care, we have created designated Provider Care Teams.  These Care Teams include your primary Cardiologist (physician) and Advanced Practice Providers (APPs -  Physician Assistants and Nurse Practitioners) who all work together to provide you with the care you need, when  you need it.  We recommend signing up for the patient portal called "MyChart".  Sign up information is provided on this After Visit Summary.  MyChart is used to connect with patients for Virtual Visits (Telemedicine).  Patients are able to view lab/test results, encounter notes, upcoming appointments, etc.  Non-urgent messages can be sent to your provider as well.   To learn more about what you can do with MyChart, go to [email or web address].    Your next appointment:    APP first available   Dr Royann Shivers 4 months        Signed, Thurmon Fair, MD  09/17/2022 2:14 PM    [facility] [address] [place], [place], [place]  [address] Phone: [phone]; Fax: [phone]

## 2022-09-13 NOTE — Patient Instructions (Signed)
Medication Instructions:  Rosuvastatin 20 mg every evening *If you need a refill on your cardiac medications before your next appointment, please call your pharmacy*  Testing/Procedures: Dr Royann Shivers has ordered a Lexiscan Myocardial Perfusion Imaging Study.  Please arrive 15 minutes prior to your appointment time for registration and insurance purposes.   The test will take approximately 3 to 4 hours to complete; you may bring reading material.  If someone comes with you to your appointment, they will need to remain in the main lobby due to limited space in the testing area. **If you are pregnant or breastfeeding, please notify the nuclear lab prior to your appointment**   How to prepare for your Myocardial Perfusion Test: Do not eat or drink 3 hours prior to your test, except you may have water. Do not consume products containing caffeine (regular or decaffeinated) 12 hours prior to your test. (ex: coffee, chocolate, sodas, tea). Do wear comfortable clothes (no dresses or overalls) and walking shoes, tennis shoes preferred (No heels or open toe shoes are allowed). Do NOT wear cologne, perfume, aftershave, or lotions (deodorant is allowed). If you use an inhaler, use it the AM of your test and bring it with you.  If you use a nebulizer, use it the AM of your test.  If these instructions are not followed, your test will have to be rescheduled.   Your physician has requested that you have a renal artery duplex. During this test, an ultrasound is used to evaluate blood flow to the kidneys. Allow one hour for this exam. Do not eat after midnight the day before and avoid carbonated beverages. Take your medications as you usually do.    Follow-Up: At [facility], you and your health needs are our priority.  As part of our continuing mission to provide you with exceptional heart care, we have created designated Provider Care Teams.  These Care Teams include your primary Cardiologist  (physician) and Advanced Practice Providers (APPs -  Physician Assistants and Nurse Practitioners) who all work together to provide you with the care you need, when you need it.  We recommend signing up for the patient portal called "MyChart".  Sign up information is provided on this After Visit Summary.  MyChart is used to connect with patients for Virtual Visits (Telemedicine).  Patients are able to view lab/test results, encounter notes, upcoming appointments, etc.  Non-urgent messages can be sent to your provider as well.   To learn more about what you can do with MyChart, go to [email or web address].    Your next appointment:    APP first available   Dr Royann Shivers 4 months

## 2022-09-17 DIAGNOSIS — I700 Atherosclerosis of aorta: Secondary | ICD-10-CM

## 2022-09-17 DIAGNOSIS — Z8679 Personal history of other diseases of the circulatory system: Secondary | ICD-10-CM | POA: Insufficient documentation

## 2022-09-17 DIAGNOSIS — I2510 Atherosclerotic heart disease of native coronary artery without angina pectoris: Secondary | ICD-10-CM | POA: Insufficient documentation

## 2022-09-17 HISTORY — DX: Atherosclerosis of aorta: I70.0

## 2022-09-18 ENCOUNTER — Telehealth (HOSPITAL_COMMUNITY): Payer: Self-pay | Admitting: Cardiovascular Disease

## 2022-09-18 ENCOUNTER — Other Ambulatory Visit (HOSPITAL_BASED_OUTPATIENT_CLINIC_OR_DEPARTMENT_OTHER): Payer: Self-pay | Admitting: Family

## 2022-09-18 NOTE — Telephone Encounter (Signed)
Patient called and cancelled Myoview and does not wish to reschedule. Order will be removed from the [facility]. If patient calls back to reschedule we will reinstate the order or create a new one. Thank you

## 2022-09-20 ENCOUNTER — Ambulatory Visit (HOSPITAL_COMMUNITY): Payer: Medicare Other

## 2022-09-21 ENCOUNTER — Ambulatory Visit (INDEPENDENT_AMBULATORY_CARE_PROVIDER_SITE_OTHER): Payer: Medicare Other | Admitting: Family Medicine

## 2022-09-21 VITALS — BP 128/60 | HR 74 | Temp 97.2°F | Ht 64.0 in | Wt 107.0 lb

## 2022-09-21 DIAGNOSIS — R3589 Other polyuria: Secondary | ICD-10-CM | POA: Diagnosis not present

## 2022-09-21 DIAGNOSIS — I700 Atherosclerosis of aorta: Secondary | ICD-10-CM | POA: Diagnosis not present

## 2022-09-21 DIAGNOSIS — E039 Hypothyroidism, unspecified: Secondary | ICD-10-CM

## 2022-09-21 DIAGNOSIS — I110 Hypertensive heart disease with heart failure: Secondary | ICD-10-CM

## 2022-09-21 LAB — POCT URINALYSIS DIP (CLINITEK)
Bilirubin, UA: NEGATIVE
Glucose, UA: NEGATIVE mg/dL
Ketones, POC UA: NEGATIVE mg/dL
Leukocytes, UA: NEGATIVE
Nitrite, UA: NEGATIVE
POC PROTEIN,UA: NEGATIVE
Spec Grav, UA: 1.02 (ref 1.010–1.025)
Urobilinogen, UA: 0.2 E.U./dL
pH, UA: 6 (ref 5.0–8.0)

## 2022-09-21 NOTE — Progress Notes (Signed)
Subjective:  Patient ID: Leah Pittman, female    DOB: 1930/09/12  Age: 89 y.o. MRN: [id number]  Chief Complaint  Patient presents with   Medical Management of Chronic Issues    HPI Patient presents today for 5 week follow up for hypertension. Currently taking olmesartan 20 mg daily and clonidine 0.1 mg as needed. States Cardiologist decreased her crestor to 20 mg daily. States she always feels weak and feels shob. States she was scheduled to have a stress test, but patient refused to get it.  This was ordered by her cardiologist.  He also ultrasound to evaluate for renal artery stenosis.     07/20/2022    2:24 PM 06/29/2022    2:13 PM 04/19/2022    1:34 PM 06/29/2021    9:28 AM 05/11/2021    4:28 PM  Depression screen PHQ 2/9  Decreased Interest 0 0 0 0 0  Down, Depressed, Hopeless 0 0 0 0 0  PHQ - 2 Score 0 0 0 0 0  Altered sleeping  2     Tired, decreased energy  3     Change in appetite  3     Feeling bad or failure about yourself   0     Trouble concentrating  0     Moving slowly or fidgety/restless  0     Suicidal thoughts  0     PHQ-9 Score  8     Difficult doing work/chores  Somewhat difficult           07/20/2022    2:24 PM  Fall Risk   Falls in the past year? 0  Number falls in past yr: 0  Injury with Fall? 0  Risk for fall due to : Impaired balance/gait;Impaired mobility  Follow up Falls evaluation completed;Falls prevention discussed    Patient Care Team: Blane Ohara, MD as PCP - General (Family Medicine) Croitoru, Rachelle Hora, MD as PCP - Cardiology (Cardiology) Lucie Leather Alvira Philips, MD as Consulting Physician (Allergy and Immunology) Lynann Bologna, MD as Consulting Physician (Gastroenterology) Zettie Pho, [facility] (Inactive) (Pharmacist)   Review of Systems  Constitutional:  Negative for chills, fatigue and fever.  HENT:  Negative for congestion, ear pain, rhinorrhea and sore throat.   Respiratory:  Positive for shortness of breath. Negative for cough.    Cardiovascular:  Negative for chest pain.  Musculoskeletal:  Negative for back pain and myalgias.  Neurological:  Positive for weakness.    Current Outpatient Medications on File Prior to Visit  Medication Sig Dispense Refill   aspirin 81 MG tablet Take 81 mg by mouth daily.     Budeson-Glycopyrrol-Formoterol (BREZTRI AEROSPHERE) 160-9-4.8 MCG/ACT AERO Inhale 2 puffs into the lungs 2 (two) times daily as needed (for flares). 10.7 g 11   Cholecalciferol (VITAMIN D3 SUPER STRENGTH) 50 MCG (2000 UT) CAPS Take 1,000 Units by mouth daily after lunch.     dorzolamide-timolol (COSOPT) 22.3-6.8 MG/ML ophthalmic solution Place 1 drop into the left eye 2 (two) times daily.      latanoprost (XALATAN) 0.005 % ophthalmic solution Place 1 drop into the left eye at bedtime.     levothyroxine (SYNTHROID) 88 MCG tablet Take 1 tablet (88 mcg total) by mouth daily. 90 tablet 3   olmesartan (BENICAR) 20 MG tablet TAKE 1 TABLET BY MOUTH EVERY DAY 90 tablet 2   rosuvastatin (CRESTOR) 20 MG tablet Take 1 tablet (20 mg total) by mouth every evening. 90 tablet 3   No current facility-administered medications  on file prior to visit.   Past Medical History:  Diagnosis Date   Anxiety    Arthritis    Chest pain 2011   CARDIOLITE - no significant symptoms, EKG changes, or arrhythmias   Congenital prolapse of bladder mucosa    Diverticulosis    Dyspnea 02/16/2012   2D ECHO - EF 55-60%, normal   Esophageal dysphagia    Fatigue    GERD (gastroesophageal reflux disease)    Glaucoma    Headache(784.0)    Heart attack (HCC)    Hiatal hernia    High blood pressure 02/16/2012   RENAL DOPPLER - normal   Hyperlipidemia    Hypothyroidism    Labile hypertension    Lightheadedness    Migraine headache    Multiple allergies    Myalgia    NSTEMI (non-ST elevated myocardial infarction) (HCC) 07/14/2020   OSA (obstructive sleep apnea)    Pain, lower leg    Calf   Problem of menstruation    Proteinuria    Reflux     SOB (shortness of breath)    Thyroid disease    Urinary problem    Valvular heart disease    Vitamin B12 deficiency    Vitamin D deficiency    Past Surgical History:  Procedure Laterality Date   ABDOMINAL HYSTERECTOMY  1976   CHOLECYSTECTOMY  2000   COLONOSCOPY  07/27/2014   Moderate predominantly sigmoid divertuculosis. Otherwise normal colonoscopy   CORONARY ARTERY BYPASS GRAFT N/A 07/10/2018   Procedure: CORONARY ARTERY BYPASS GRAFTING (CABG), FREE LIMA;  Surgeon: [name], MD;  Location: [facility] OR;  Service: Open Heart Surgery;  Laterality: N/A;   CYST REMOVAL NECK     CYSTECTOMY  1974   Intestine   CYSTECTOMY  1996   Brain stem   ESOPHAGOGASTRODUODENOSCOPY  12/07/2016   Schatzki's ring status post esophageal dilatation. Small hiatal hernia. Mild gastritis   EYE SURGERY  2003   EYE SURGERY  07/2016   LEFT HEART CATH AND CORONARY ANGIOGRAPHY N/A 07/08/2018   Procedure: LEFT HEART CATH AND CORONARY ANGIOGRAPHY;  Surgeon: Runell Gess, MD;  [facility]: [facility] INVAS[facility];  Service: Cardiovascular;  Laterality: N/A;   LEFT HEART CATH AND CORONARY ANGIOGRAPHY N/A 07/25/2019   Procedure: LEFT HEART CATH AND CORONARY ANGIOGRAPHY;  Surgeon: Kathleene Hazel, MD;  Location: [facility];  Service: Cardiovascular;  Laterality: N/A;   MOUTH SURGERY  2013   RADIOLOGY WITH ANESTHESIA N/A 11/05/2019   Procedure: MRI WITH ANESTHESIA;  Surgeon: Radiologist, Medication, MD;  Location: [facility] OR;  Service: Radiology;  Laterality: N/A;   TEE WITHOUT CARDIOVERSION N/A 07/10/2018   Procedure: TRANSESOPHAGEAL ECHOCARDIOGRAM (TEE);  Surgeon: [name], MD;  Location: [facility] OR;  Service: Open Heart Surgery;  Laterality: N/A;   TONSILLECTOMY     age 59    Family History  Problem Relation Age of Onset   Other Mother        blood disorder - unsure of name   Heart attack Father    Stroke Father    Cancer Sister    Cancer Brother    Asthma Brother    Cancer Brother    Social  History   Socioeconomic History   Marital status: Married    Spouse name: Not on file   Number of children: 3   Years of education: college   Highest education level: Not on file  Occupational History   Occupation: Retired  Tobacco Use   Smoking  status: Never   Smokeless tobacco: Never  Vaping Use   Vaping status: Never Used  Substance and Sexual Activity   Alcohol use: No   Drug use: No   Sexual activity: Not on file  Other Topics Concern   Not on file  Social History Narrative   Lives with husband in [place], [place].   Right-handed.   No daily caffeine use.   Social Determinants of Health   Financial Resource Strain: Medium Risk (10/19/2021)   Overall Financial Resource Strain (CARDIA)    Difficulty of Paying Living Expenses: Somewhat hard  Food Insecurity: No Food Insecurity (10/19/2021)   Hunger Vital Sign    Worried About Running Out of Food in the Last Year: Never true    Ran Out of Food in the Last Year: Never true  Transportation Needs: No Transportation Needs (05/09/2022)   PRAPARE - Administrator, Civil Service (Medical): No    Lack of Transportation (Non-Medical): No  Physical Activity: Inactive (05/09/2022)   Exercise Vital Sign    Days of Exercise per Week: 0 days    Minutes of Exercise per Session: 0 min  Stress: No Stress Concern Present (05/11/2021)   [employer] of Occupational Health - Occupational Stress Questionnaire    Feeling of Stress : Only a little  Social Connections: Moderately Integrated (04/19/2022)   Social Connection and Isolation Panel [NHANES]    Frequency of Communication with Friends and Family: Once a week    Frequency of Social Gatherings with Friends and Family: Twice a week    Attends Religious Services: More than 4 times per year    Active Member of [employer] or Organizations: No    Attends Engineer, structural: Never    Marital Status: Married    Objective:  BP 128/60   Pulse 74   Temp (!) 97.2 F (36.2  C)   Ht 5\' 4"  (1.626 m)   Wt 107 lb (48.5 kg)   SpO2 96%   BMI 18.37 kg/m      09/21/2022    3:37 PM 09/13/2022    3:08 PM 08/15/2022    4:06 PM  BP/Weight  Systolic BP 128 172 96  Diastolic BP 60 70 43  Wt. (Lbs) 107 107.6   BMI 18.37 kg/m2 18.47 kg/m2     Physical Exam Vitals reviewed.  Constitutional:      Appearance: Normal appearance.     Comments: thin  Neck:     Vascular: No carotid bruit.  Cardiovascular:     Rate and Rhythm: Normal rate and regular rhythm.     Heart sounds: Normal heart sounds.  Pulmonary:     Effort: Pulmonary effort is normal. No respiratory distress.     Breath sounds: Normal breath sounds.  Abdominal:     General: Abdomen is flat. Bowel sounds are normal.     Palpations: Abdomen is soft.     Tenderness: There is no abdominal tenderness.  Neurological:     Mental Status: She is alert and oriented to person, place, and time.  Psychiatric:        Mood and Affect: Mood normal.        Behavior: Behavior normal.     Diabetic Foot Exam - Simple   No data filed      Lab Results  Component Value Date   WBC 6.9 08/15/2022   HGB 12.5 08/15/2022   HCT 36.4 08/15/2022   PLT 216 08/15/2022   GLUCOSE 108 (H) 08/15/2022  CHOL 170 06/29/2022   TRIG 104 06/29/2022   HDL 73 06/29/2022   LDLCALC 79 06/29/2022   ALT 18 08/15/2022   AST 21 08/15/2022   NA 138 08/15/2022   K 4.7 08/15/2022   CL 102 08/15/2022   CREATININE 0.71 08/15/2022   BUN 16 08/15/2022   CO2 23 08/15/2022   TSH 0.211 (L) 08/15/2022   INR 0.9 07/14/2020   HGBA1C 5.9 (H) 12/08/2021      Assessment & Plan:    Polyuria Assessment & Plan: Check urine and urine culture.  Orders: -     POCT URINALYSIS DIP (CLINITEK) -     Urine Culture  Acquired hypothyroidism Assessment & Plan: Continue Synthroid 88 mcg daily.   Hypertensive heart disease with heart failure (HCC) Assessment & Plan: Lability of blood pressure. Await renal ultrasound arteries. Continue  olmesartan 20 mg daily.  Has not used clonidine.   Atherosclerosis of aorta (HCC) Assessment & Plan: Continue aspirin 81 mg daily and Crestor 20 mg 1 p.o. nightly.      No orders of the defined types were placed in this encounter.   Orders Placed This Encounter  Procedures   Urine Culture   POCT URINALYSIS DIP (CLINITEK)     Follow-up: Return in about 6 weeks (around 11/02/2022) for chronic follow up.   I,Katherina A Bramblett,acting as a scribe for Blane Ohara, MD.,have documented all relevant documentation on the behalf of Blane Ohara, MD,as directed by  Blane Ohara, MD while in the presence of Blane Ohara, MD.   An After Visit Summary was printed and given to the patient.  Blane Ohara, MD  Family Practice [phone]

## 2022-09-21 NOTE — Progress Notes (Deleted)
Cardiology Office Note:    Date:  09/21/2022   ID:  Leah Pittman, DOB May 02, 1930, MRN [id number]  PCP:  Blane Ohara, MD   [facility] Providers Cardiologist:  Thurmon Fair, MD { Click to update primary MD,subspecialty MD or APP then REFRESH:1}    Referring MD: Blane Ohara, MD   No chief complaint on file. ***  History of Present Illness:    Leah Pittman is a 89 y.o. female with a hx of hypertension, hypercholesterolemia, CAD s/p CABG x4 (07/10/2018, LIMA-LAD, SVG-diagonal, sequential SVG-OM1-OM 2), postop atrial fibrillation.  She has chronic pain including chest pain.  She has chronic respiratory failure with home O2.  No renal artery stenosis by duplex in 2013.  She underwent repeat heart catheterization 07/25/2019 that showed severe two-vessel disease along with ostial left main stenosis.  Both vein grafts were patent, LIMA was not visualized and may not have been used as it was described in the OR report.  Her last echocardiogram of 07/15/2020 showed LVEF 60-65%, grade 1 diastolic dysfunction, normal RV function, and no significant valvular disease.  She has been seen in clinic and has reported pain all over her body including her chest that is worse at night.  She did stop statin therapy for 2 months but this did not lead to any improvement in her aches and pains.  She does battle early satiety and severe constipation.  She had a barium swallow study that appeared normal.  She was seen by Dr. Royann Shivers 09/13/22 and reported chest pain similar to her prior angina prior to CABG.  He is commended nuclear stress testing.  However she called and canceled the study.  He also recommended renal artery duplex, which was performed on 09/22/2022.      CAD s/p CABG 06/2018 Heart catheterization in 2021 showed known CAD and patent SVGs She canceled nuclear stress test   Hyperlipidemia with LDL goal less than 70 10/07/2021: Cholesterol, Total 173; HDL 68; LDL Chol Calc (NIH) 87;  Triglycerides 98 On simvastatin Statin holiday did not change her MSK pain   Hypertension Renal artery duplex 09/22/2022 showed _____________   Chronic pain   Postop A-fib No recurrence, not on anticoagulation        Past Medical History:  Diagnosis Date   Anxiety    Arthritis    Chest pain 2011   CARDIOLITE - no significant symptoms, EKG changes, or arrhythmias   Congenital prolapse of bladder mucosa    Diverticulosis    Dyspnea 02/16/2012   2D ECHO - EF 55-60%, normal   Esophageal dysphagia    Fatigue    GERD (gastroesophageal reflux disease)    Glaucoma    Headache(784.0)    Heart attack (HCC)    Hiatal hernia    High blood pressure 02/16/2012   RENAL DOPPLER - normal   Hyperlipidemia    Hypothyroidism    Labile hypertension    Lightheadedness    Migraine headache    Multiple allergies    Myalgia    NSTEMI (non-ST elevated myocardial infarction) (HCC) 07/14/2020   OSA (obstructive sleep apnea)    Pain, lower leg    Calf   Problem of menstruation    Proteinuria    Reflux    SOB (shortness of breath)    Thyroid disease    Urinary problem    Valvular heart disease    Vitamin B12 deficiency    Vitamin D deficiency     Past Surgical History:  Procedure Laterality  Date   ABDOMINAL HYSTERECTOMY  1976   CHOLECYSTECTOMY  2000   COLONOSCOPY  07/27/2014   Moderate predominantly sigmoid divertuculosis. Otherwise normal colonoscopy   CORONARY ARTERY BYPASS GRAFT N/A 07/10/2018   Procedure: CORONARY ARTERY BYPASS GRAFTING (CABG), FREE LIMA;  Surgeon: [name], MD;  Location: [facility] OR;  Service: Open Heart Surgery;  Laterality: N/A;   CYST REMOVAL NECK     CYSTECTOMY  1974   Intestine   CYSTECTOMY  1996   Brain stem   ESOPHAGOGASTRODUODENOSCOPY  12/07/2016   Schatzki's ring status post esophageal dilatation. Small hiatal hernia. Mild gastritis   EYE SURGERY  2003   EYE SURGERY  07/2016   LEFT HEART CATH AND CORONARY ANGIOGRAPHY N/A 07/08/2018    Procedure: LEFT HEART CATH AND CORONARY ANGIOGRAPHY;  Surgeon: Runell Gess, MD;  Location: [facility];  Service: Cardiovascular;  Laterality: N/A;   LEFT HEART CATH AND CORONARY ANGIOGRAPHY N/A 07/25/2019   Procedure: LEFT HEART CATH AND CORONARY ANGIOGRAPHY;  Surgeon: Kathleene Hazel, MD;  [facility]: [facility];  Service: Cardiovascular;  Laterality: N/A;   MOUTH SURGERY  2013   RADIOLOGY WITH ANESTHESIA N/A 11/05/2019   Procedure: MRI WITH ANESTHESIA;  Surgeon: Radiologist, Medication, MD;  Location: [facility] OR;  Service: Radiology;  Laterality: N/A;   TEE WITHOUT CARDIOVERSION N/A 07/10/2018   Procedure: TRANSESOPHAGEAL ECHOCARDIOGRAM (TEE);  Surgeon: [name], MD;  [facility] OR;  Service: Open Heart Surgery;  Laterality: N/A;   TONSILLECTOMY     age 38    Current Medications: No outpatient medications have been marked as taking for the 09/28/22 encounter (Appointment) with Marcelino Duster, PA.     Allergies:   Benadryl [diphenhydramine], Lipase concentrate-hp [digestive enzymes], Zenpep [pancrelipase (lip-prot-amyl)], Codeine, Meperidine, Other, Prednisone, Promethazine, Propranolol, Shellfish allergy, Tape, Tazarotene, and Iodinated contrast media   Social History   Socioeconomic History   Marital status: Married    Spouse name: Not on file   Number of children: 3   Years of education: college   Highest education level: Not on file  Occupational History   Occupation: Retired  Tobacco Use   Smoking status: Never   Smokeless tobacco: Never  Vaping Use   Vaping status: Never Used  Substance and Sexual Activity   Alcohol use: No   Drug use: No   Sexual activity: Not on file  Other Topics Concern   Not on file  Social History Narrative   Lives with husband in [place], [place].   Right-handed.   No daily caffeine use.   Social Determinants of Health   Financial Resource Strain: Medium Risk (10/19/2021)   Overall Financial Resource Strain  (CARDIA)    Difficulty of Paying Living Expenses: Somewhat hard  Food Insecurity: No Food Insecurity (10/19/2021)   Hunger Vital Sign    Worried About Running Out of Food in the Last Year: Never true    Ran Out of Food in the Last Year: Never true  Transportation Needs: No Transportation Needs (05/09/2022)   PRAPARE - Administrator, Civil Service (Medical): No    Lack of Transportation (Non-Medical): No  Physical Activity: Inactive (05/09/2022)   Exercise Vital Sign    Days of Exercise per Week: 0 days    Minutes of Exercise per Session: 0 min  Stress: No Stress Concern Present (05/11/2021)   [employer] of Occupational Health - Occupational Stress Questionnaire    Feeling of Stress : Only a little  Social  Connections: Moderately Integrated (04/19/2022)   Social Connection and Isolation Panel [NHANES]    Frequency of Communication with Friends and Family: Once a week    Frequency of Social Gatherings with Friends and Family: Twice a week    Attends Religious Services: More than 4 times per year    Active Member of [employer] or Organizations: No    Attends Engineer, structural: Never    Marital Status: Married     Family History: The patient's ***family history includes Asthma in her brother; Cancer in her brother, brother, and sister; Heart attack in her father; Other in her mother; Stroke in her father.  ROS:   Please see the history of present illness.    *** All other systems reviewed and are negative.  EKGs/Labs/Other Studies Reviewed:    The following studies were reviewed today: ***      Recent Labs: 06/29/2022: Magnesium 2.4 08/15/2022: ALT 18; BUN 16; Creatinine, Ser 0.71; Hemoglobin 12.5; NT-Pro BNP 470; Platelets 216; Potassium 4.7; Sodium 138; TSH 0.211  Recent Lipid Panel    Component Value Date/Time   CHOL 170 06/29/2022 1520   TRIG 104 06/29/2022 1520   HDL 73 06/29/2022 1520   CHOLHDL 2.3 06/29/2022 1520   CHOLHDL 3.0 11/04/2019 0617    VLDL 17 11/04/2019 0617   LDLCALC 79 06/29/2022 1520     Risk Assessment/Calculations:   {Does this patient have ATRIAL FIBRILLATION?:[phone]}  No BP recorded.  {Refresh Note OR Click here to enter BP  :1}***         Physical Exam:    VS:  There were no vitals taken for this visit.    Wt Readings from Last 3 Encounters:  09/13/22 107 lb 9.6 oz (48.8 kg)  08/15/22 108 lb (49 kg)  07/21/22 108 lb (49 kg)     GEN: *** Well nourished, well developed in no acute distress HEENT: Normal NECK: No JVD; No carotid bruits LYMPHATICS: No lymphadenopathy CARDIAC: ***RRR, no murmurs, rubs, gallops RESPIRATORY:  Clear to auscultation without rales, wheezing or rhonchi  ABDOMEN: Soft, non-tender, non-distended MUSCULOSKELETAL:  No edema; No deformity  SKIN: Warm and dry NEUROLOGIC:  Alert and oriented x 3 PSYCHIATRIC:  Normal affect   ASSESSMENT:    No diagnosis found. PLAN:    In order of problems listed above:  ***      {Are you ordering a CV Procedure (e.g. stress test, cath, DCCV, TEE, etc)?   Press F2        :[id number]}    Medication Adjustments/Labs and Tests Ordered: Current medicines are reviewed at length with the patient today.  Concerns regarding medicines are outlined above.  No orders of the defined types were placed in this encounter.  No orders of the defined types were placed in this encounter.   There are no Patient Instructions on file for this visit.   Signed, Marcelino Duster, PA  09/21/2022 11:08 AM    [facility]

## 2022-09-22 ENCOUNTER — Ambulatory Visit (HOSPITAL_COMMUNITY)
Admission: RE | Admit: 2022-09-22 | Discharge: 2022-09-22 | Disposition: A | Discharge status: Home / Self Care | Payer: Medicare Other | Source: Ambulatory Visit | Attending: Cardiovascular Disease | Admitting: Cardiovascular Disease

## 2022-09-22 DIAGNOSIS — I129 Hypertensive chronic kidney disease with stage 1 through stage 4 chronic kidney disease, or unspecified chronic kidney disease: Secondary | ICD-10-CM | POA: Insufficient documentation

## 2022-09-24 ENCOUNTER — Encounter: Payer: Self-pay | Admitting: Family Medicine

## 2022-09-24 DIAGNOSIS — R3589 Other polyuria: Secondary | ICD-10-CM | POA: Insufficient documentation

## 2022-09-24 NOTE — Assessment & Plan Note (Signed)
Continue aspirin 81 mg daily and Crestor 20 mg 1 p.o. nightly.

## 2022-09-24 NOTE — Assessment & Plan Note (Signed)
Continue Synthroid 88 mcg daily  

## 2022-09-24 NOTE — Assessment & Plan Note (Signed)
Check urine and urine culture.

## 2022-09-24 NOTE — Assessment & Plan Note (Signed)
Lability of blood pressure. Await renal ultrasound arteries. Continue olmesartan 20 mg daily.  Has not used clonidine.

## 2022-09-28 ENCOUNTER — Ambulatory Visit: Payer: Medicare Other | Admitting: Physician Assistant

## 2022-10-03 ENCOUNTER — Encounter: Payer: Self-pay | Admitting: Physician Assistant

## 2022-10-03 ENCOUNTER — Ambulatory Visit: Payer: Medicare Other | Attending: Physician Assistant | Admitting: Physician Assistant

## 2022-10-03 VITALS — BP 150/44 | HR 84 | Ht 64.0 in | Wt 106.0 lb

## 2022-10-03 DIAGNOSIS — G8929 Other chronic pain: Secondary | ICD-10-CM | POA: Insufficient documentation

## 2022-10-03 DIAGNOSIS — I480 Paroxysmal atrial fibrillation: Secondary | ICD-10-CM | POA: Diagnosis present

## 2022-10-03 DIAGNOSIS — I10 Essential (primary) hypertension: Secondary | ICD-10-CM | POA: Diagnosis not present

## 2022-10-03 DIAGNOSIS — Z951 Presence of aortocoronary bypass graft: Secondary | ICD-10-CM | POA: Insufficient documentation

## 2022-10-03 DIAGNOSIS — E785 Hyperlipidemia, unspecified: Secondary | ICD-10-CM | POA: Insufficient documentation

## 2022-10-03 DIAGNOSIS — R0789 Other chest pain: Secondary | ICD-10-CM | POA: Diagnosis not present

## 2022-10-03 NOTE — Patient Instructions (Addendum)
Medication Instructions:  Your physician recommends that you continue on your current medications as directed. Please refer to the Current Medication list given to you today.   *If you need a refill on your cardiac medications before your next appointment, please call your pharmacy*    Follow-Up: At [facility], you and your health needs are our priority.  As part of our continuing mission to provide you with exceptional heart care, we have created designated Provider Care Teams.  These Care Teams include your primary Cardiologist (physician) and Advanced Practice Providers (APPs -  Physician Assistants and Nurse Practitioners) who all work together to provide you with the care you need, when you need it.  We recommend signing up for the patient portal called "MyChart".  Sign up information is provided on this After Visit Summary.  MyChart is used to connect with patients for Virtual Visits (Telemedicine).  Patients are able to view lab/test results, encounter notes, upcoming appointments, etc.  Non-urgent messages can be sent to your provider as well.   To learn more about what you can do with MyChart, go to [email or web address].    Your next appointment:   Monday, November 4th at 2:40pm  Provider:   Thurmon Fair, MD

## 2022-10-03 NOTE — Progress Notes (Signed)
Cardiology Office Note:    Date:  10/03/2022   ID:  Leah Pittman, DOB [birth year], MRN [id number]  PCP:  Blane Ohara, MD   [facility] Providers Cardiologist:  Thurmon Fair, MD     Referring MD: Blane Ohara, MD   Chief Complaint  Patient presents with   Follow-up    CAD    History of Present Illness:    Leah Pittman is a 89 y.o. female with a hx of hypertension, hypercholesterolemia, CAD s/p CABG x4 (07/10/2018, LIMA-LAD, SVG-diagonal, sequential SVG-OM1-OM 2), postop atrial fibrillation.  She has chronic pain including chest pain.  She has chronic respiratory failure with home O2.  No renal artery stenosis by duplex in 2013.  She underwent repeat heart catheterization 07/25/2019 that showed severe two-vessel disease along with ostial left main stenosis.  Both vein grafts were patent, LIMA was not visualized and may not have been used as it was described in the OR report.  Her last echocardiogram of 07/15/2020 showed LVEF 60-65%, grade 1 diastolic dysfunction, normal RV function, and no significant valvular disease.  She has been seen in clinic and has reported pain all over her body including her chest that is worse at night.  She did stop statin therapy for 2 months but this did not lead to any improvement in her aches and pains.  She does battle early satiety and severe constipation.  She had a barium swallow study that appeared normal.  She was seen by Dr. Royann Shivers 09/13/22 and reported chest pain similar to her prior angina prior to CABG.  He is commended nuclear stress testing.  However she called and canceled the study.  He also recommended renal artery duplex, which was performed on 09/22/2022 and negative for stenosis.  She presents today for follow up. She feels terrible, complains of nausea. She has an upcoming appt with GI.  She continues to report chest discomfort on the left side but this is complicated by concomitant abdominal pain, CP worse with different  sleeping positions. She also states she has CP and nausea with going up steps.   I offered a stress test - she states a relative used to work in our nuclear stress testing center and told her she wouldn't tolerate a nuclear stress test. I discussed details of our lexiscan myoview and she declines. I discuss heart catheterization, but she also reports difficulty during and after her last heart cath with groin hematoma and pain at her left radial site (states she was not put to sleep). She states GI has told her in the past that she would not get an EGD because it was too high risk. We discussed that I strongly agree with a GI evaluation, but if they want invasive testing, we would likely need to evaluate her exertional chest pain/DOE.   With further questioning, I think her chest pain is atypical and likely related to her abdominal pain. CP with picking up certain objects and position changes. They also recount that her BP has been very labile. She is now only on 20 mg olmesartan. BP elevated but wide pulse pressure. Reviewed last echo.   No longer taking clonidine PRN.  She has several allergies and doesn't want a new medication.    Past Medical History:  Diagnosis Date   Anxiety    Arthritis    Chest pain 2011   CARDIOLITE - no significant symptoms, EKG changes, or arrhythmias   Congenital prolapse of bladder mucosa    Diverticulosis  Dyspnea 02/16/2012   2D ECHO - EF 55-60%, normal   Esophageal dysphagia    Fatigue    GERD (gastroesophageal reflux disease)    Glaucoma    Headache(784.0)    Heart attack (HCC)    Hiatal hernia    High blood pressure 02/16/2012   RENAL DOPPLER - normal   Hyperlipidemia    Hypothyroidism    Labile hypertension    Lightheadedness    Migraine headache    Multiple allergies    Myalgia    NSTEMI (non-ST elevated myocardial infarction) (HCC) 07/14/2020   OSA (obstructive sleep apnea)    Pain, lower leg    Calf   Problem of menstruation     Proteinuria    Reflux    SOB (shortness of breath)    Thyroid disease    Urinary problem    Valvular heart disease    Vitamin B12 deficiency    Vitamin D deficiency     Past Surgical History:  Procedure Laterality Date   ABDOMINAL HYSTERECTOMY  1976   CHOLECYSTECTOMY  2000   COLONOSCOPY  07/27/2014   Moderate predominantly sigmoid divertuculosis. Otherwise normal colonoscopy   CORONARY ARTERY BYPASS GRAFT N/A 07/10/2018   Procedure: CORONARY ARTERY BYPASS GRAFTING (CABG), FREE LIMA;  Surgeon: [name], MD;  Location: [facility] OR;  Service: Open Heart Surgery;  Laterality: N/A;   CYST REMOVAL NECK     CYSTECTOMY  1974   Intestine   CYSTECTOMY  1996   Brain stem   ESOPHAGOGASTRODUODENOSCOPY  12/07/2016   Schatzki's ring status post esophageal dilatation. Small hiatal hernia. Mild gastritis   EYE SURGERY  2003   EYE SURGERY  07/2016   LEFT HEART CATH AND CORONARY ANGIOGRAPHY N/A 07/08/2018   Procedure: LEFT HEART CATH AND CORONARY ANGIOGRAPHY;  Surgeon: Runell Gess, MD;  Location: [facility] INVASIVE [facility] LAB;  Service: Cardiovascular;  Laterality: N/A;   LEFT HEART CATH AND CORONARY ANGIOGRAPHY N/A 07/25/2019   Procedure: LEFT HEART CATH AND CORONARY ANGIOGRAPHY;  Surgeon: Kathleene Hazel, MD;  Location: [facility];  Service: Cardiovascular;  Laterality: N/A;   MOUTH SURGERY  2013   RADIOLOGY WITH ANESTHESIA N/A 11/05/2019   Procedure: MRI WITH ANESTHESIA;  Surgeon: Radiologist, Medication, MD;  Location: [facility] OR;  Service: Radiology;  Laterality: N/A;   TEE WITHOUT CARDIOVERSION N/A 07/10/2018   Procedure: TRANSESOPHAGEAL ECHOCARDIOGRAM (TEE);  Surgeon: [name], MD;  Location: [facility] OR;  Service: Open Heart Surgery;  Laterality: N/A;   TONSILLECTOMY     age 72    Current Medications: Current Meds  Medication Sig   aspirin 81 MG tablet Take 81 mg by mouth daily.   Budeson-Glycopyrrol-Formoterol (BREZTRI AEROSPHERE) 160-9-4.8 MCG/ACT AERO Inhale 2 puffs into the  lungs 2 (two) times daily as needed (for flares).   Cholecalciferol (VITAMIN D3 SUPER STRENGTH) 50 MCG (2000 UT) CAPS Take 1,000 Units by mouth daily after lunch.   dorzolamide-timolol (COSOPT) 22.3-6.8 MG/ML ophthalmic solution Place 1 drop into the left eye 2 (two) times daily.    latanoprost (XALATAN) 0.005 % ophthalmic solution Place 1 drop into the left eye at bedtime.   levothyroxine (SYNTHROID) 88 MCG tablet Take 1 tablet (88 mcg total) by mouth daily.   olmesartan (BENICAR) 20 MG tablet TAKE 1 TABLET BY MOUTH EVERY DAY   rosuvastatin (CRESTOR) 20 MG tablet Take 1 tablet (20 mg total) by mouth every evening.     Allergies:   Benadryl [diphenhydramine], Lipase concentrate-hp [digestive enzymes], Zenpep [pancrelipase (  lip-prot-amyl)], Codeine, Meperidine, Other, Prednisone, Promethazine, Propranolol, Shellfish allergy, Tape, Tazarotene, and Iodinated contrast media   Social History   Socioeconomic History   Marital status: Married    Spouse name: Not on file   Number of children: 3   Years of education: college   Highest education level: Not on file  Occupational History   Occupation: Retired  Tobacco Use   Smoking status: Never   Smokeless tobacco: Never  Vaping Use   Vaping status: Never Used  Substance and Sexual Activity   Alcohol use: No   Drug use: No   Sexual activity: Not on file  Other Topics Concern   Not on file  Social History Narrative   Lives with husband in [place], [place].   Right-handed.   No daily caffeine use.   Social Determinants of Health   Financial Resource Strain: Medium Risk (10/19/2021)   Overall Financial Resource Strain (CARDIA)    Difficulty of Paying Living Expenses: Somewhat hard  Food Insecurity: No Food Insecurity (10/19/2021)   Hunger Vital Sign    Worried About Running Out of Food in the Last Year: Never true    Ran Out of Food in the Last Year: Never true  Transportation Needs: No Transportation Needs (05/09/2022)   PRAPARE -  Administrator, Civil Service (Medical): No    Lack of Transportation (Non-Medical): No  Physical Activity: Inactive (05/09/2022)   Exercise Vital Sign    Days of Exercise per Week: 0 days    Minutes of Exercise per Session: 0 min  Stress: No Stress Concern Present (05/11/2021)   [employer] of Occupational Health - Occupational Stress Questionnaire    Feeling of Stress : Only a little  Social Connections: Moderately Integrated (04/19/2022)   Social Connection and Isolation Panel [NHANES]    Frequency of Communication with Friends and Family: Once a week    Frequency of Social Gatherings with Friends and Family: Twice a week    Attends Religious Services: More than 4 times per year    Active Member of [employer] or Organizations: No    Attends Engineer, structural: Never    Marital Status: Married     Family History: The patient's family history includes Asthma in her brother; Cancer in her brother, brother, and sister; Heart attack in her father; Other in her mother; Stroke in her father.  ROS:   Please see the history of present illness.     All other systems reviewed and are negative.  EKGs/Labs/Other Studies Reviewed:    The following studies were reviewed today:  Cardiac Studies & Procedures   CARDIAC CATHETERIZATION  CARDIAC CATHETERIZATION 07/25/2019  Narrative  Prox RCA to Mid RCA lesion is 40% stenosed.  Ost LM to Mid LM lesion is 60% stenosed.  Mid Cx lesion is 99% stenosed.  SVG graft was visualized by angiography and is normal in caliber.  The graft exhibits no disease.  SVG graft was visualized by angiography and is normal in caliber.  The graft exhibits no disease.  Mid LAD lesion is 70% stenosed.  Severe double vessel CAD Moderately severe ostial left main stenosis Severe stenosis mid LAD. The SVG to the LAD is patent. The LIMA does not appear to have been used despite the findings in the operative report. The LIMA is small and  atretic. Severe stenosis mid Circumflex. OM and OM2 fill from the patent vein graft. Mild disease mid RCA  Recommendations: Continue medical management of CAD. Will start lactulose  for constipation. She could be discharged later today after bedrest if she is stable.  Findings Coronary Findings Diagnostic  Dominance: Right  Left Main Ost LM to Mid LM lesion is 60% stenosed.  Left Anterior Descending Mid LAD lesion is 70% stenosed.  Left Circumflex Mid Cx lesion is 99% stenosed.  Right Coronary Artery Vessel is large. Prox RCA to Mid RCA lesion is 40% stenosed. The lesion is calcified.  Saphenous Graft To 2nd Mrg SVG graft was visualized by angiography and is normal in caliber. The graft exhibits no disease.  Saphenous Graft To Dist LAD SVG graft was visualized by angiography and is normal in caliber. The graft exhibits no disease.  Intervention  No interventions have been documented.   CARDIAC CATHETERIZATION  CARDIAC CATHETERIZATION 07/08/2018  Narrative Images from the original result were not included.   Prox RCA to Mid RCA lesion is 40% stenosed.  Ost LM lesion is 75% stenosed.  Mid LM lesion is 40% stenosed.  Ost Cx to Prox Cx lesion is 50% stenosed.  Prox Cx to Mid Cx lesion is 95% stenosed.  Prox LAD to Mid LAD lesion is 90% stenosed.  The left ventricular systolic function is normal.  LV end diastolic pressure is normal.  The left ventricular ejection fraction is greater than 65% by visual estimate.  Leah Pittman is a 89 y.o. female   [id number] LOCATION:  FACILITY: [facility] PHYSICIAN: Nanetta Batty, M.D. 19-Jun-1930   DATE OF PROCEDURE:  07/08/2018  DATE OF DISCHARGE:     CARDIAC CATHETERIZATION    History obtained from chart review.  Ms. Barg is a an 89 year old thin and frail appearing married Caucasian female patient of Dr. Erin Hearing who was set up for outpatient diagnostic cardiac catheterization because of atypical chest  pain and a coronary CTA that suggested LAD and circumflex disease.  She does have history of hypertension and hyperlipidemia.  Impression Ms. Wrisley has left main/ 2 vessel disease.  She has 75% ostial left main, calcified proximal and mid circumflex with an ulcerated lesion in the mid AV groove circumflex and 90% mid LAD.  She has mild RCA disease and normal LV function.  I believe the best therapy would be CABG given her left main disease, and severe calcification of her coronary arteries.  She did have some mild chest pain at the end of the case and received sublingual nitroglycerin.  The sheath was removed and a TR band was placed on the right wrist to achieve patent hemostasis.  The patient left lab in stable condition.  Dr. Royann Shivers and TCS were notified.  Nanetta Batty. MD, [facility] 07/08/2018 8:27 AM  Findings Coronary Findings Diagnostic  Dominance: Right  Left Main Ost LM lesion is 75% stenosed. Mid LM lesion is 40% stenosed.  Left Anterior Descending Prox LAD to Mid LAD lesion is 90% stenosed.  Left Circumflex Ost Cx to Prox Cx lesion is 50% stenosed. Prox Cx to Mid Cx lesion is 95% stenosed.  Right Coronary Artery Prox RCA to Mid RCA lesion is 40% stenosed.  Intervention  No interventions have been documented.   STRESS TESTS  NM MYOCAR MULTI W/SPECT W 03/15/2012   ECHOCARDIOGRAM  ECHOCARDIOGRAM COMPLETE 11/17/2021  Narrative ECHOCARDIOGRAM REPORT    Patient Name:   Leah Pittman Date of Exam: 11/17/2021 Medical Rec #:  [id number]       Height:       64.0 in Accession #:    [id number]      Weight:  116.8 lb Date of Birth:  1930/11/30      BSA:          1.557 m Patient Age:    89 years        BP:           122/76 mmHg Patient Gender: F               HR:           62 bpm. Exam Location:  [address]  Procedure: 2D Echo, Cardiac Doppler and Color Doppler  Indications:    R06.02 SOB  History:        Patient has prior history of Echocardiogram  examinations, most recent 07/15/2020. CAD and Previous Myocardial Infarction, Signs/Symptoms:Chest Pain and Fatigue; Risk Factors:Hypertension, Dyslipidemia and Sleep Apnea.  Sonographer:    Cathie Beams RCS Referring Phys: [id number]  NICOLE   IMPRESSIONS   1. Left ventricular ejection fraction, by estimation, is 60 to 65%. The left ventricle has normal function. The left ventricle has no regional wall motion abnormalities. Left ventricular diastolic parameters were normal. 2. Right ventricular systolic function is normal. The right ventricular size is normal. 3. The mitral valve is normal in structure. Trivial mitral valve regurgitation. No evidence of mitral stenosis. 4. The aortic valve is tricuspid. Aortic valve regurgitation is mild. Aortic valve sclerosis is present, with no evidence of aortic valve stenosis. 5. The inferior vena cava is normal in size with greater than 50% respiratory variability, suggesting right atrial pressure of 3 mmHg.  FINDINGS Left Ventricle: Left ventricular ejection fraction, by estimation, is 60 to 65%. The left ventricle has normal function. The left ventricle has no regional wall motion abnormalities. The left ventricular internal cavity size was normal in size. There is no left ventricular hypertrophy. Left ventricular diastolic parameters were normal.  Right Ventricle: The right ventricular size is normal. Right ventricular systolic function is normal.  Left Atrium: Left atrial size was normal in size.  Right Atrium: Right atrial size was normal in size.  Pericardium: There is no evidence of pericardial effusion.  Mitral Valve: The mitral valve is normal in structure. Trivial mitral valve regurgitation. No evidence of mitral valve stenosis.  Tricuspid Valve: The tricuspid valve is normal in structure. Tricuspid valve regurgitation is mild . No evidence of tricuspid stenosis.  Aortic Valve: The aortic valve is tricuspid. Aortic valve  regurgitation is mild. Aortic valve sclerosis is present, with no evidence of aortic valve stenosis.  Pulmonic Valve: The pulmonic valve was normal in structure. Pulmonic valve regurgitation is mild. No evidence of pulmonic stenosis.  Aorta: The aortic root is normal in size and structure.  Venous: The inferior vena cava is normal in size with greater than 50% respiratory variability, suggesting right atrial pressure of 3 mmHg.  IAS/Shunts: No atrial level shunt detected by color flow Doppler.   LEFT VENTRICLE PLAX 2D LVIDd:         3.70 cm   Diastology LVIDs:         2.30 cm   LV e' medial:    11.80 cm/s LV PW:         0.70 cm   LV E/e' medial:  7.9 LV IVS:        0.70 cm   LV e' lateral:   11.20 cm/s LVOT diam:     1.90 cm   LV E/e' lateral: 8.3 LV SV:         49 LV SV Index:   32  LVOT Area:     2.84 cm   RIGHT VENTRICLE RV Basal diam:  2.90 cm RV S prime:     8.70 cm/s TAPSE (M-mode): 1.4 cm  LEFT ATRIUM             Index        RIGHT ATRIUM           Index LA diam:        3.50 cm 2.25 cm/m   RA Area:     13.50 cm LA Vol (A2C):   30.7 ml 19.72 ml/m  RA Volume:   31.40 ml  20.17 ml/m LA Vol (A4C):   21.5 ml 13.81 ml/m LA Biplane Vol: 26.6 ml 17.09 ml/m AORTIC VALVE             PULMONIC VALVE LVOT Vmax:   72.00 cm/s  PR End Diast Vel: 2.41 msec LVOT Vmean:  49.300 cm/s LVOT VTI:    0.174 m  AORTA Ao Root diam: 2.80 cm Ao Asc diam:  2.70 cm  MITRAL VALVE MV Area (PHT): 3.65 cm    SHUNTS MV Decel Time: 208 msec    Systemic VTI:  0.17 m MV E velocity: 92.80 cm/s  Systemic Diam: 1.90 cm MV A velocity: 95.40 cm/s MV E/A ratio:  0.97  Olga Millers MD Electronically signed by Olga Millers MD Signature Date/Time: 11/17/2021/4:11:15 PM    Final   TEE  ECHO INTRAOPERATIVE TEE 07/10/2018  Narrative *INTRAOPERATIVE TRANSESOPHAGEAL REPORT *    Patient Name:   Leah Pittman Date of Exam: 07/10/2018 Medical Rec #:  [id number]       Height:       64.0  in Accession #:    [id number]      Weight:       116.7 lb Date of Birth:  Sep 22, 1930      BSA:          1.56 m Patient Age:    87 years        BP:           138/63 mmHg Patient Gender: F               HR:           61 bpm. Exam Location:  Inpatient  Transesophogeal exam was perform intraoperatively during surgical procedure. Patient was closely monitored under general anesthesia during the entirety of examination.  Indications:     Coronary artery disease. History:         CAD Signs/Symptoms: Chest Pain Risk Factors: Hypertension and Dyslipidemia. Performing Phys: [id number] [name] Diagnosing Phys: Marcene Duos MD  Complications: No known complications during this procedure. POST-OP IMPRESSIONS - Left Ventricle: The left ventricle is unchanged from pre-bypass. - Right Ventricle: The right ventricle appears unchanged from pre-bypass. - Aorta: The aorta appears unchanged from pre-bypass. - Left Atrium: The left atrium appears unchanged from pre-bypass. - Left Atrial Appendage: The left atrial appendage appears unchanged from pre-bypass. - Aortic Valve: The aortic valve appears unchanged from pre-bypass. - Mitral Valve: The mitral valve appears unchanged from pre-bypass. - Tricuspid Valve: The tricuspid valve appears unchanged from pre-bypass. - Interatrial Septum: The interatrial septum appears unchanged from pre-bypass. - Interventricular Septum: The interventricular septum appears unchanged from pre-bypass. - Pericardium: The pericardium appears unchanged from pre-bypass.  PRE-OP FINDINGS Left Ventricle: The left ventricle has normal systolic function, with an ejection fraction of 55-60%. The cavity size was normal. There is concentric left ventricular hypertrophy.  Right Ventricle: The right ventricle has normal systolic function. The cavity was normal. There is no increase in right ventricular wall thickness.  Left Atrium: Left atrial size was normal in size.  Right  Atrium: Right atrial size was normal in size. Right atrial pressure is estimated at 10 mmHg.  Interatrial Septum: No atrial level shunt detected by color flow Doppler.  Pericardium: There is no evidence of pericardial effusion.  Mitral Valve: The mitral valve is normal in structure. Mild thickening of the mitral valve leaflet. Mitral valve regurgitation is not visualized by color flow Doppler.  Tricuspid Valve: The tricuspid valve was normal in structure. Tricuspid valve regurgitation is trivial by color flow Doppler.  Aortic Valve: The aortic valve is tricuspid There is Mild thickening of the aortic valve Aortic valve regurgitation is trivial by color flow Doppler. There is no stenosis of the aortic valve.  Pulmonic Valve: The pulmonic valve was normal in structure. Pulmonic valve regurgitation is trivial by color flow Doppler.   Aorta: The aortic root and ascending aorta are normal in size and structure. There is evidence of plaque in the descending aorta; Grade I, measuring 1-9mm in size.   Marcene Duos MD Electronically signed by Marcene Duos MD Signature Date/Time: 07/16/2018/7:52:32 PM    Final    CT SCANS  CT CORONARY MORPH W/CTA COR W/SCORE 05/06/2018  Addendum 05/07/2018  8:10 AM ADDENDUM REPORT: 05/07/2018 08:07  EXAM: OVER-READ INTERPRETATION  CT CHEST  The following report is an over-read performed by radiologist Dr. Royal [facility] on 05/07/2018. This over-read does not include interpretation of cardiac or coronary anatomy or pathology. The coronary calcium score and cardiac CTA interpretation by the cardiologist is attached.  COMPARISON:  None.  FINDINGS: Aortic atherosclerosis. Borderline enlarged partially calcified right infrahilar lymph node (axial image 21 of series 3), stable compared to prior studies, considered benign. There are few scattered tiny subcentimeter pulmonary nodules in the periphery of the lungs  bilaterally, largest of which is a subpleural nodule measuring 3 x 7 mm (mean diameter 5 mm) in the right lower lobe (axial image 16 of series 12). Within the visualized portions of the thorax there are no other larger more suspicious appearing pulmonary nodules or masses, there is no acute consolidative airspace disease, no pleural effusions, no pneumothorax and no lymphadenopathy. Visualized portions of the upper abdomen are unremarkable. There are no aggressive appearing lytic or blastic lesions noted in the visualized portions of the skeleton.  IMPRESSION: 1. Small pulmonary nodules measuring 5 mm or less in size, nonspecific, but statistically likely benign. No follow-up needed if patient is low-risk (and has no known or suspected primary neoplasm). Non-contrast chest CT can be considered in 12 months if patient is high-risk. This recommendation follows the consensus statement: Guidelines for Management of Incidental Pulmonary Nodules Detected on CT Images: From the Fleischner Society 2017; Radiology 2017; 284:228-243. 2.  Aortic Atherosclerosis (ICD10-I70.0).   Electronically Signed By: Trudie Reed M.D. 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  EXAM: Cardiac/Coronary  CT  TECHNIQUE: The patient was scanned on a [employer].  FINDINGS: A 120 kV prospective scan was triggered in the descending thoracic aorta at 111 HU's. Axial non-contrast 3 mm slices were carried out through the heart. The data set was analyzed on a dedicated work station and scored using the Agatson method. Gantry rotation speed was 250 msecs and  collimation was .6 mm. No beta blockade and 0.8 mg of sl NTG was given. The 3D data set was reconstructed in 5% intervals of the 67-82 % of the R-R cycle. Diastolic phases were analyzed on a dedicated  work station using MPR, MIP and VRT modes. The patient received 80 cc of contrast.  Aorta: Normal size. Moderate diffuse atherosclerotic plaque and calcifications. No dissection.  Aortic Valve:  Trileaflet.  No calcifications.  Coronary Arteries:  Normal coronary origin.  Right dominance.  RCA is a large dominant artery that gives rise to PDA and PLVB. There moderate diffuse predominantly calcified plaque in the mid RCA with associated stenosis 50-69%. Distal RCA, PDA and PLA have minimal plaque.  Left main is a large artery that gives rise to LAD and LCX arteries. Ostial left main has mild calcified plaque with stenosis 25-50%. Distal left main/ostial LAD have moderate calcified plaque wit stenosis 50-69%.  Ostial LAD has moderate calcified plaque with stenosis 50-69% but possibly > 70%. Proximal LAD has severe calcified plaque with stenosis suspicious for > 70%. Mid and distal LAD have only minimal plaque.  D1 is a small artery that originates at the severe LAD stenosis, however lumen of this artery is less than 1.8 mm.  LCX is a medium caliber non dominant artery that gives rise to two OM branches. There is mild calcified ostial plaque with stenosis 25-50%. Proximal LCX artery has a long moderate calcified plaque with two areas of moderate stenosis 50-69%.  OM1 and 2 have only mild plaque.  Other findings:  Normal pulmonary vein drainage into the left atrium.  Normal let atrial appendage without a thrombus.  Normal size of the pulmonary artery.  IMPRESSION: 1. Coronary calcium score of 920. This was 52 percentile for age and sex matched control.  2. Normal coronary origin with right dominance.  3. Diffuse moderate to severe plaque with moderate CAD in the mid RCA, proximal LCX arteries, distal left main and at suspicious for severe stenosis in the proximal LAD. Additional analysis with CT FFR are recommended.  Electronically Signed: By: Tobias Alexander On:  05/06/2018 18:10                Recent Labs: 06/29/2022: Magnesium 2.4 08/15/2022: ALT 18; BUN 16; Creatinine, Ser 0.71; Hemoglobin 12.5; NT-Pro BNP 470; Platelets 216; Potassium 4.7; Sodium 138; TSH 0.211  Recent Lipid Panel    Component Value Date/Time   CHOL 170 06/29/2022 1520   TRIG 104 06/29/2022 1520   HDL 73 06/29/2022 1520   CHOLHDL 2.3 06/29/2022 1520   CHOLHDL 3.0 11/04/2019 0617   VLDL 17 11/04/2019 0617   LDLCALC 79 06/29/2022 1520     Risk Assessment/Calculations:          Physical Exam:    VS:  BP (!) 150/44   Pulse 84   Ht 5\' 4"  (1.626 m)   Wt 106 lb (48.1 kg)   SpO2 95%   BMI 18.19 kg/m     Wt Readings from Last 3 Encounters:  10/03/22 106 lb (48.1 kg)  09/21/22 107 lb (48.5 kg)  09/13/22 107 lb 9.6 oz (48.8 kg)     GEN:  Well nourished, well developed in no acute distress HEENT: Normal NECK: No JVD; No carotid bruits LYMPHATICS: No lymphadenopathy CARDIAC: RRR, no murmurs, rubs, gallops RESPIRATORY:  Clear to auscultation without rales, wheezing or rhonchi  ABDOMEN: Soft, non-tender, non-distended MUSCULOSKELETAL:  No edema; No deformity  SKIN: Warm and dry NEUROLOGIC:  Alert and  oriented x 3 PSYCHIATRIC:  Normal affect   ASSESSMENT:    1. Atypical chest pain   2. S/P CABG x 4   3. Hyperlipidemia with target LDL less than 70   4. Primary hypertension   5. Other chronic pain   6. PAF (paroxysmal atrial fibrillation) (HCC)    PLAN:    In order of problems listed above:  Left chest pain - worse with certain positions, complicated by ongoing abdominal pain, but also sounds exertional - with further questioning, PC occurs with picking up certain objects, I think this is atypical - given her history of CABG, suspect we may need to complete an ischemic evaluation - she does not want to do a stress test or repeat a heart cath - I think anti-anginal therapy will complicate her ongoing BP issues - I have suggested ranexa, she does not  want to try this, she wants to see GI first and I think this is fine   CAD s/p CABG 06/2018 Heart catheterization in 2021 showed known CAD and patent SVGs She canceled nuclear stress test   Hyperlipidemia with LDL goal less than 70 10/07/2021: Cholesterol, Total 173; HDL 68; LDL Chol Calc (NIH) 87; Triglycerides 98 On simvastatin Statin holiday did not change her MSK pain   Hypertension Renal artery duplex 09/22/2022 showed no evidence of RAS Variable BP   Chronic pain Weight loss, chronic constipation - following with GI   Postop A-fib No recurrence, not on anticoagulation   Follow up with Dr. Royann Shivers in 4 months.      Medication Adjustments/Labs and Tests Ordered: Current medicines are reviewed at length with the patient today.  Concerns regarding medicines are outlined above.  No orders of the defined types were placed in this encounter.  No orders of the defined types were placed in this encounter.   There are no Patient Instructions on file for this visit.   Signed, Marcelino Duster, [place]  10/03/2022 10:46 AM    [facility]

## 2022-10-20 ENCOUNTER — Other Ambulatory Visit: Payer: Self-pay | Admitting: Gastroenterology

## 2022-10-20 DIAGNOSIS — R1013 Epigastric pain: Secondary | ICD-10-CM

## 2022-11-07 ENCOUNTER — Ambulatory Visit (INDEPENDENT_AMBULATORY_CARE_PROVIDER_SITE_OTHER): Payer: Medicare Other | Admitting: Family Medicine

## 2022-11-07 VITALS — BP 134/70 | HR 72 | Temp 97.1°F | Resp 18 | Ht 64.0 in | Wt 105.0 lb

## 2022-11-07 DIAGNOSIS — R531 Weakness: Secondary | ICD-10-CM

## 2022-11-07 DIAGNOSIS — I110 Hypertensive heart disease with heart failure: Secondary | ICD-10-CM

## 2022-11-07 DIAGNOSIS — R3121 Asymptomatic microscopic hematuria: Secondary | ICD-10-CM | POA: Diagnosis not present

## 2022-11-07 DIAGNOSIS — E782 Mixed hyperlipidemia: Secondary | ICD-10-CM

## 2022-11-07 DIAGNOSIS — R5383 Other fatigue: Secondary | ICD-10-CM

## 2022-11-07 DIAGNOSIS — M8580 Other specified disorders of bone density and structure, unspecified site: Secondary | ICD-10-CM | POA: Insufficient documentation

## 2022-11-07 DIAGNOSIS — G629 Polyneuropathy, unspecified: Secondary | ICD-10-CM

## 2022-11-07 DIAGNOSIS — K219 Gastro-esophageal reflux disease without esophagitis: Secondary | ICD-10-CM

## 2022-11-07 DIAGNOSIS — E039 Hypothyroidism, unspecified: Secondary | ICD-10-CM

## 2022-11-07 DIAGNOSIS — M810 Age-related osteoporosis without current pathological fracture: Secondary | ICD-10-CM

## 2022-11-07 HISTORY — DX: Other fatigue: R53.83

## 2022-11-07 HISTORY — DX: Polyneuropathy, unspecified: G62.9

## 2022-11-07 HISTORY — DX: Other specified disorders of bone density and structure, unspecified site: M85.80

## 2022-11-07 LAB — POCT URINALYSIS DIP (CLINITEK)
Bilirubin, UA: NEGATIVE
Glucose, UA: NEGATIVE mg/dL
Ketones, POC UA: NEGATIVE mg/dL
Leukocytes, UA: NEGATIVE
Nitrite, UA: NEGATIVE
POC PROTEIN,UA: NEGATIVE
Spec Grav, UA: 1.02 (ref 1.010–1.025)
Urobilinogen, UA: 0.2 U/dL
pH, UA: 6 (ref 5.0–8.0)

## 2022-11-07 MED ORDER — GABAPENTIN 100 MG PO CAPS
100.0000 mg | ORAL_CAPSULE | Freq: Every day | ORAL | 2 refills | Status: DC
Start: 2022-11-07 — End: 2022-12-28

## 2022-11-07 NOTE — Progress Notes (Signed)
Subjective:  Patient ID: Leah Pittman, female    DOB: 11-01-1930  Age: 89 y.o. MRN: [id number]  Chief Complaint  Patient presents with   Hypertension   HPI Patient complains that her fatigue is worse. Patient is sleeping poorly. Polyuria.  Complaining pressure around eyes. Nausea every day. Poor appetite. Nothing sounds good. She is eating anyways. Bowels moving daily, she reports that her bowels are black  Not taking iron or pepto bismol. Takes miralax twice daily as needed. Patient has poor eye sight, so unsure if bowels are truly black.  Breakfast: Biscuit and scrambled egg or a gravy biscuit or oatmeal. 1 cup of decaf coffee. Juice (grape, orange or cranberry.)  Lunch: vegetable plate (greens, peas, mac and cheese, potato salad.) Eats chicken, but not much other foods. Fresh fruit (not every day, but often.) Supper: sandwich or meat, vegetables Dessert: peach cobbler. Taking ensure high protein once daily.  Unable to eat raw vegetables/salad. It upsets her stomach.  Asthma/COPD: taking breztri 2 puffs twice daily, albuterol as needed. Helps her breathing. Throat is always raspy. Continue albuterol.  CORONARY ARTERY DISEASE:  on Crestor 20mg  once daily.   Hypertensive heart disease: on benicar 20 mg 1/2 tablet twice daily. Aspirin 81 mg daily.   GERD: currently not taking protonix   Hypothyroidism: on synthroid 88 mcg once daily in am.   Vitamin D: 2000 U once daily.   Glaucoma: on cosopt, xalatan, xelpros.      11/07/2022    9:54 AM 07/20/2022    2:24 PM 06/29/2022    2:13 PM 04/19/2022    1:34 PM 06/29/2021    9:28 AM  Depression screen PHQ 2/9  Decreased Interest 0 0 0 0 0  Down, Depressed, Hopeless 0 0 0 0 0  PHQ - 2 Score 0 0 0 0 0  Altered sleeping   2    Tired, decreased energy   3    Change in appetite   3    Feeling bad or failure about yourself    0    Trouble concentrating   0    Moving slowly or fidgety/restless   0    Suicidal thoughts   0    PHQ-9  Score   8    Difficult doing work/chores   Somewhat difficult          11/07/2022    9:53 AM  Fall Risk   Falls in the past year? 0  Number falls in past yr: 0  Injury with Fall? 0  Risk for fall due to : Impaired balance/gait;Impaired vision  Follow up Falls evaluation completed;Falls prevention discussed    Patient Care Team: Blane Ohara, MD as PCP - General (Family Medicine) Croitoru, Rachelle Hora, MD as PCP - Cardiology (Cardiology) Lucie Leather Alvira Philips, MD as Consulting Physician (Allergy and Immunology) Lynann Bologna, MD as Consulting Physician (Gastroenterology) Zettie Pho, [facility] (Inactive) (Pharmacist)   Review of Systems  Constitutional:  Positive for fatigue. Negative for chills and fever.  HENT:  Negative for congestion, rhinorrhea and sore throat.   Respiratory:  Positive for shortness of breath. Negative for cough.   Cardiovascular:  Positive for chest pain.  Gastrointestinal:  Positive for abdominal pain and nausea. Negative for constipation, diarrhea and vomiting.  Genitourinary:  Positive for frequency. Negative for dysuria and urgency.  Musculoskeletal:  Negative for back pain and myalgias.  Neurological:  Positive for weakness and light-headedness. Negative for dizziness and headaches.  Psychiatric/Behavioral:  Negative for dysphoric mood.  The patient is not nervous/anxious.     Current Outpatient Medications on File Prior to Visit  Medication Sig Dispense Refill   albuterol (VENTOLIN HFA) 108 (90 Base) MCG/ACT inhaler Inhale 2 puffs into the lungs.     aspirin 81 MG tablet Take 81 mg by mouth daily.     Budeson-Glycopyrrol-Formoterol (BREZTRI AEROSPHERE) 160-9-4.8 MCG/ACT AERO Inhale 2 puffs into the lungs 2 (two) times daily as needed (for flares). 10.7 g 11   Cholecalciferol (VITAMIN D3 SUPER STRENGTH) 50 MCG (2000 UT) CAPS Take 1,000 Units by mouth daily after lunch.     dorzolamide-timolol (COSOPT) 22.3-6.8 MG/ML ophthalmic solution Place 1 drop into the left eye  2 (two) times daily.      latanoprost (XALATAN) 0.005 % ophthalmic solution Place 1 drop into the left eye at bedtime.     levothyroxine (SYNTHROID) 88 MCG tablet Take 1 tablet (88 mcg total) by mouth daily. 90 tablet 3   olmesartan (BENICAR) 20 MG tablet TAKE 1 TABLET BY MOUTH EVERY DAY 90 tablet 2   rosuvastatin (CRESTOR) 20 MG tablet Take 1 tablet (20 mg total) by mouth every evening. 90 tablet 3   No current facility-administered medications on file prior to visit.   Past Medical History:  Diagnosis Date   Anxiety    Arthritis    Chest pain 2011   CARDIOLITE - no significant symptoms, EKG changes, or arrhythmias   Congenital prolapse of bladder mucosa    Diverticulosis    Dyspnea 02/16/2012   2D ECHO - EF 55-60%, normal   Esophageal dysphagia    Fatigue    GERD (gastroesophageal reflux disease)    Glaucoma    Headache(784.0)    Heart attack (HCC)    Hiatal hernia    High blood pressure 02/16/2012   RENAL DOPPLER - normal   Hyperlipidemia    Hypothyroidism    Labile hypertension    Lightheadedness    Migraine headache    Multiple allergies    Myalgia    NSTEMI (non-ST elevated myocardial infarction) (HCC) 07/14/2020   OSA (obstructive sleep apnea)    Pain, lower leg    Calf   Problem of menstruation    Proteinuria    Reflux    SOB (shortness of breath)    Thyroid disease    Urinary problem    Valvular heart disease    Vitamin B12 deficiency    Vitamin D deficiency    Past Surgical History:  Procedure Laterality Date   ABDOMINAL HYSTERECTOMY  1976   CHOLECYSTECTOMY  2000   COLONOSCOPY  07/27/2014   Moderate predominantly sigmoid divertuculosis. Otherwise normal colonoscopy   CORONARY ARTERY BYPASS GRAFT N/A 07/10/2018   Procedure: CORONARY ARTERY BYPASS GRAFTING (CABG), FREE LIMA;  Surgeon: [name], MD;  Location: [facility] OR;  Service: Open Heart Surgery;  Laterality: N/A;   CYST REMOVAL NECK     CYSTECTOMY  1974   Intestine   CYSTECTOMY  1996   Brain  stem   ESOPHAGOGASTRODUODENOSCOPY  12/07/2016   Schatzki's ring status post esophageal dilatation. Small hiatal hernia. Mild gastritis   EYE SURGERY  2003   EYE SURGERY  07/2016   LEFT HEART CATH AND CORONARY ANGIOGRAPHY N/A 07/08/2018   Procedure: LEFT HEART CATH AND CORONARY ANGIOGRAPHY;  Surgeon: Runell Gess, MD;  Location: [facility] INVASIVE CV LAB;  Service: Cardiovascular;  Laterality: N/A;   LEFT HEART CATH AND CORONARY ANGIOGRAPHY N/A 07/25/2019   Procedure: LEFT HEART CATH AND CORONARY ANGIOGRAPHY;  Surgeon: Kathleene Hazel, MD;  Location: [facility] INVASIVE CV LAB;  Service: Cardiovascular;  Laterality: N/A;   MOUTH SURGERY  2013   RADIOLOGY WITH ANESTHESIA N/A 11/05/2019   Procedure: MRI WITH ANESTHESIA;  Surgeon: Radiologist, Medication, MD;  Location: [facility] OR;  Service: Radiology;  Laterality: N/A;   TEE WITHOUT CARDIOVERSION N/A 07/10/2018   Procedure: TRANSESOPHAGEAL ECHOCARDIOGRAM (TEE);  Surgeon: [name], MD;  Location: [facility] OR;  Service: Open Heart Surgery;  Laterality: N/A;   TONSILLECTOMY     age 77    Family History  Problem Relation Age of Onset   Other Mother        blood disorder - unsure of name   Heart attack Father    Stroke Father    Cancer Sister    Cancer Brother    Asthma Brother    Cancer Brother    Social History   Socioeconomic History   Marital status: Married    Spouse name: Not on file   Number of children: 3   Years of education: college   Highest education level: Not on file  Occupational History   Occupation: Retired  Tobacco Use   Smoking status: Never   Smokeless tobacco: Never  Vaping Use   Vaping status: Never Used  Substance and Sexual Activity   Alcohol use: No   Drug use: No   Sexual activity: Not on file  Other Topics Concern   Not on file  Social History Narrative   Lives with husband in [place], [place].   Right-handed.   No daily caffeine use.   Social Determinants of Health   Financial Resource Strain: Medium Risk  (10/19/2021)   Overall Financial Resource Strain (CARDIA)    Difficulty of Paying Living Expenses: Somewhat hard  Food Insecurity: No Food Insecurity (10/19/2021)   Hunger Vital Sign    Worried About Running Out of Food in the Last Year: Never true    Ran Out of Food in the Last Year: Never true  Transportation Needs: No Transportation Needs (05/09/2022)   PRAPARE - Administrator, Civil Service (Medical): No    Lack of Transportation (Non-Medical): No  Physical Activity: Inactive (05/09/2022)   Exercise Vital Sign    Days of Exercise per Week: 0 days    Minutes of Exercise per Session: 0 min  Stress: No Stress Concern Present (05/11/2021)   [employer] of Occupational Health - Occupational Stress Questionnaire    Feeling of Stress : Only a little  Social Connections: Moderately Integrated (04/19/2022)   Social Connection and Isolation Panel [NHANES]    Frequency of Communication with Friends and Family: Once a week    Frequency of Social Gatherings with Friends and Family: Twice a week    Attends Religious Services: More than 4 times per year    Active Member of [employer] or Organizations: No    Attends Engineer, structural: Never    Marital Status: Married    Objective:  BP 134/70   Pulse 72   Temp (!) 97.1 F (36.2 C)   Resp 18   Ht 5\' 4"  (1.626 m)   Wt 105 lb (47.6 kg)   BMI 18.02 kg/m      11/07/2022    9:38 AM 10/03/2022   10:01 AM 09/21/2022    3:37 PM  BP/Weight  Systolic BP 134 150 128  Diastolic BP 70 44 60  Wt. (Lbs) 105 106 107  BMI 18.02 kg/m2 18.19 kg/m2 18.37 kg/m2  Physical Exam Vitals reviewed.  Constitutional:      Appearance: Normal appearance. She is normal weight.  Neck:     Vascular: No carotid bruit.  Cardiovascular:     Rate and Rhythm: Normal rate and regular rhythm.     Heart sounds: Normal heart sounds.  Pulmonary:     Effort: Pulmonary effort is normal. No respiratory distress.     Breath sounds: Normal breath  sounds.  Abdominal:     General: Abdomen is flat. Bowel sounds are normal.     Palpations: Abdomen is soft.     Tenderness: There is abdominal tenderness (periumbilical.).  Neurological:     Mental Status: She is alert and oriented to person, place, and time.  Psychiatric:        Mood and Affect: Mood normal.        Behavior: Behavior normal.     Diabetic Foot Exam - Simple   No data filed      Lab Results  Component Value Date   WBC 7.1 11/07/2022   HGB 12.8 11/07/2022   HCT 38.6 11/07/2022   PLT 232 11/07/2022   GLUCOSE 98 11/07/2022   CHOL 149 11/07/2022   TRIG 84 11/07/2022   HDL 67 11/07/2022   LDLCALC 66 11/07/2022   ALT 24 11/07/2022   AST 20 11/07/2022   NA 140 11/07/2022   K 4.6 11/07/2022   CL 104 11/07/2022   CREATININE 0.79 11/07/2022   BUN 16 11/07/2022   CO2 23 11/07/2022   TSH 0.902 11/07/2022   INR 0.9 07/14/2020   HGBA1C 5.9 (H) 12/08/2021      Assessment & Plan:    Acquired hypothyroidism Assessment & Plan: Continue Synthroid 88 mcg daily.  Orders: -     T4, free -     TSH -     VITAMIN D 25 Hydroxy (Vit-D Deficiency, Fractures)  Hypertensive heart disease with heart failure (HCC) Assessment & Plan: Well controlled.  No changes to medicines.  Continue to work on eating a healthy diet and exercise.  Labs drawn today.    Orders: -     CBC with Differential/Platelet -     Comprehensive metabolic panel -     VITAMIN D 25 Hydroxy (Vit-D Deficiency, Fractures)  Other fatigue Assessment & Plan: Check labs  Orders: -     VITAMIN D 25 Hydroxy (Vit-D Deficiency, Fractures)  Weakness Assessment & Plan: REFER TO PHYSICAL THERAPY AT [facility]  Orders: -     Ambulatory referral to Physical Therapy -     VITAMIN D 25 Hydroxy (Vit-D Deficiency, Fractures)  Gastroesophageal reflux disease without esophagitis Assessment & Plan: RESTART PROTONIX 40 MG ONCE DAILY.    Neuropathy Assessment & Plan:  RECOMMEND START  GABAPENTIN 100 MG at bedtime for neuropathy.  REFER TO PHYSICAL THERAPY AT [facility]  Orders: -     B12 and Folate Panel -     Gabapentin; Take 1 capsule (100 mg total) by mouth at bedtime.  Dispense: 30 capsule; Refill: 2 -     Methylmalonic acid, serum -     Ambulatory referral to Physical Therapy -     VITAMIN D 25 Hydroxy (Vit-D Deficiency, Fractures)  Mixed hyperlipidemia Assessment & Plan: Well controlled.  No changes to medicines.Crestor 20 mg once daily Continue to work on eating a healthy diet and exercise.  Labs drawn today.   Orders: -     Lipid panel -     VITAMIN D  25 Hydroxy (Vit-D Deficiency, Fractures)  Age-related osteoporosis without current pathological fracture Assessment & Plan: ORDER BONE DENSITY TEST AT [facility]   Orders: -     VITAMIN D 25 Hydroxy (Vit-D Deficiency, Fractures) -     DG Bone Density; Future  Asymptomatic microscopic hematuria Assessment & Plan: Check UA  Orders: -     POCT URINALYSIS DIP (CLINITEK)  Other orders -     VITAMIN D 25 Hydroxy (Vit-D Deficiency, Fractures)     Meds ordered this encounter  Medications   gabapentin (NEURONTIN) 100 MG capsule    Sig: Take 1 capsule (100 mg total) by mouth at bedtime.    Dispense:  30 capsule    Refill:  2    Orders Placed This Encounter  Procedures   DG Bone Density   CBC with Differential/Platelet   Comprehensive metabolic panel   Lipid panel   B12 and Folate Panel   T4, free   TSH   Methylmalonic acid, serum   VITAMIN D 25 Hydroxy (Vit-D Deficiency, Fractures)   VITAMIN D 25 Hydroxy (Vit-D Deficiency, Fractures)   Ambulatory referral to Physical Therapy   POCT URINALYSIS DIP (CLINITEK)     Follow-up: Return in about 3 months (around 02/06/2023) for chronic follow up.  Total time spent on today's visit was greater than 60 minutes, including both face-to-face time and nonface-to-face time personally spent on review of chart (labs and imaging),  discussing labs and goals, discussing further work-up, treatment options, referrals to specialist if needed, reviewing outside records of pertinent, answering patient's questions, and coordinating care.  I,Carolyn M Morrison,acting as a Neurosurgeon for Blane Ohara, MD.,have documented all relevant documentation on the behalf of Blane Ohara, MD,as directed by  Blane Ohara, MD while in the presence of Blane Ohara, MD.   Clayborn Bigness I Leal-Borjas,acting as a scribe for Blane Ohara, MD.,have documented all relevant documentation on the behalf of Blane Ohara, MD,as directed by  Blane Ohara, MD while in the presence of Blane Ohara, MD.    An After Visit Summary was printed and given to the patient.  Blane Ohara, MD [facility] [phone]

## 2022-11-07 NOTE — Patient Instructions (Addendum)
RESTART PROTONIX 40 MG ONCE DAILY.  RECOMMEND SLEEP ON A WEDGE.  RECOMMEND START GABAPENTIN 100 MG at bedtime for neuropathy. ORDER BONE DENSITY TEST AT [facility] REFER TO PHYSICAL THERAPY AT [facility] (THEY HAVE MOVED TO ACROSS FROM [facility].

## 2022-11-09 ENCOUNTER — Ambulatory Visit: Payer: Medicare Other | Admitting: Family Medicine

## 2022-11-09 ENCOUNTER — Other Ambulatory Visit: Payer: Self-pay | Admitting: Family Medicine

## 2022-11-09 DIAGNOSIS — I119 Hypertensive heart disease without heart failure: Secondary | ICD-10-CM

## 2022-11-10 LAB — T4, FREE: Free T4: 1.48 ng/dL (ref 0.82–1.77)

## 2022-11-10 LAB — METHYLMALONIC ACID, SERUM: Methylmalonic Acid: 172 nmol/L (ref 0–378)

## 2022-11-10 LAB — TSH: TSH: 0.902 u[IU]/mL (ref 0.450–4.500)

## 2022-11-11 DIAGNOSIS — R3121 Asymptomatic microscopic hematuria: Secondary | ICD-10-CM | POA: Insufficient documentation

## 2022-11-11 HISTORY — DX: Asymptomatic microscopic hematuria: R31.21

## 2022-11-11 NOTE — Assessment & Plan Note (Signed)
Well controlled.  ?No changes to medicines.  ?Continue to work on eating a healthy diet and exercise.  ?Labs drawn today.  ?

## 2022-11-11 NOTE — Assessment & Plan Note (Signed)
ORDER BONE DENSITY TEST AT [facility]

## 2022-11-11 NOTE — Assessment & Plan Note (Signed)
RESTART PROTONIX 40 MG ONCE DAILY.

## 2022-11-11 NOTE — Assessment & Plan Note (Signed)
Check labs 

## 2022-11-11 NOTE — Assessment & Plan Note (Signed)
REFER TO PHYSICAL THERAPY AT [facility]

## 2022-11-11 NOTE — Assessment & Plan Note (Deleted)
The current medical regimen is effective;  continue present plan and medications.  

## 2022-11-11 NOTE — Assessment & Plan Note (Signed)
  RECOMMEND START GABAPENTIN 100 MG at bedtime for neuropathy.  REFER TO PHYSICAL THERAPY AT [facility]

## 2022-11-11 NOTE — Assessment & Plan Note (Addendum)
Well controlled.  No changes to medicines.Crestor 20 mg once daily Continue to work on eating a healthy diet and exercise.  Labs drawn today.

## 2022-11-11 NOTE — Assessment & Plan Note (Signed)
Check UA 

## 2022-11-11 NOTE — Assessment & Plan Note (Signed)
Continue Synthroid 88 mcg daily

## 2022-11-12 ENCOUNTER — Encounter: Payer: Self-pay | Admitting: Family Medicine

## 2022-11-14 ENCOUNTER — Ambulatory Visit
Admission: RE | Admit: 2022-11-14 | Discharge: 2022-11-14 | Disposition: A | Discharge status: Home / Self Care | Payer: Medicare Other | Source: Ambulatory Visit | Attending: Gastroenterology | Admitting: Gastroenterology

## 2022-11-14 DIAGNOSIS — R1013 Epigastric pain: Secondary | ICD-10-CM

## 2022-11-27 ENCOUNTER — Telehealth: Payer: Self-pay | Admitting: Family Medicine

## 2022-11-27 NOTE — Telephone Encounter (Signed)
   Leah Pittman has been scheduled for the following appointment:  WHAT: BONE DENSITY WHERE: [facility] DATE: 12/22/2022 TIME: 10:30 AM CHECK-IN  Patient has been made aware.

## 2022-12-25 ENCOUNTER — Ambulatory Visit: Payer: Medicare Other | Attending: Cardiovascular Disease | Admitting: Cardiovascular Disease

## 2022-12-25 ENCOUNTER — Encounter: Payer: Self-pay | Admitting: Cardiovascular Disease

## 2022-12-25 VITALS — BP 145/81 | HR 67 | Ht 64.0 in | Wt 108.0 lb

## 2022-12-25 DIAGNOSIS — I700 Atherosclerosis of aorta: Secondary | ICD-10-CM | POA: Insufficient documentation

## 2022-12-25 DIAGNOSIS — I4891 Unspecified atrial fibrillation: Secondary | ICD-10-CM | POA: Diagnosis present

## 2022-12-25 DIAGNOSIS — I9789 Other postprocedural complications and disorders of the circulatory system, not elsewhere classified: Secondary | ICD-10-CM | POA: Insufficient documentation

## 2022-12-25 DIAGNOSIS — I25810 Atherosclerosis of coronary artery bypass graft(s) without angina pectoris: Secondary | ICD-10-CM | POA: Insufficient documentation

## 2022-12-25 DIAGNOSIS — T466X5A Adverse effect of antihyperlipidemic and antiarteriosclerotic drugs, initial encounter: Secondary | ICD-10-CM | POA: Diagnosis present

## 2022-12-25 DIAGNOSIS — R079 Chest pain, unspecified: Secondary | ICD-10-CM | POA: Insufficient documentation

## 2022-12-25 DIAGNOSIS — G720 Drug-induced myopathy: Secondary | ICD-10-CM | POA: Insufficient documentation

## 2022-12-25 DIAGNOSIS — E785 Hyperlipidemia, unspecified: Secondary | ICD-10-CM | POA: Diagnosis present

## 2022-12-25 DIAGNOSIS — I10 Essential (primary) hypertension: Secondary | ICD-10-CM | POA: Insufficient documentation

## 2022-12-25 NOTE — Patient Instructions (Addendum)
Medication Instructions:  - FAMOTIDINE 20mg  daily (can get over-the-counter) - Repatha 140mg  every 2 weeks - Olmesartan 20mg  Dose depends on MORNING bloodpressure For systolic blood pressure  <110 take 1/4 pill (5mg ) 110-150 take 1/2 pill (10mg ) >150 take whole pill (20mg )   *If you need a refill on your cardiac medications before your next appointment, please call your pharmacy*   Lab Work: Your physician recommends that you return for lab work in: 3 months   If you have labs (blood work) drawn today and your tests are completely normal, you will receive your results only by: MyChart Message (if you have MyChart) OR A paper copy in the mail If you have any lab test that is abnormal or we need to change your treatment, we will call you to review the results.   Testing/Procedures: NONE   Follow-Up: At [facility], you and your health needs are our priority.  As part of our continuing mission to provide you with exceptional heart care, we have created designated Provider Care Teams.  These Care Teams include your primary Cardiologist (physician) and Advanced Practice Providers (APPs -  Physician Assistants and Nurse Practitioners) who all work together to provide you with the care you need, when you need it.  We recommend signing up for the patient portal called "MyChart".  Sign up information is provided on this After Visit Summary.  MyChart is used to connect with patients for Virtual Visits (Telemedicine).  Patients are able to view lab/test results, encounter notes, upcoming appointments, etc.  Non-urgent messages can be sent to your provider as well.   To learn more about what you can do with MyChart, go to [email or web address].    Your next appointment:   6 month(s)  The format for your next appointment:   In Person  Provider:   Thurmon Fair, MD    Other Instructions Dr. Royann Shivers has referred you to lipid clinic for Repatha

## 2022-12-25 NOTE — Progress Notes (Unsigned)
Cardiology Office Note    Date:  12/28/2022   ID:  Leah Pittman, DOB 1930-12-31, MRN [id number]  PCP:  Leah Ohara, MD  Cardiologist:   Leah Fair, MD   Chief Complaint  Patient presents with   Coronary Artery Disease    History of Present Illness:  AMIREE Pittman is a 89 y.o. female with HTN and severe hypercholesterolemia, labile HTN and multivessel CAD s/p CABG (07/10/2018, [place], LIMA to LAD, SVG to diagonal, sequential SVG to OM1+OM 2), complicated by transient postoperative atrial fibrillation. Her husband Leah Pittman is also my patient.  She has stopped taking rosuvastatin for about a month and feels better.  She has difficulty telling me exactly what the symptoms were, but I think she is describing myopathy.  She continues to have chest pain, but this generally occurs when she is eating. For many years she has had a variety of different types of atypical chest pain.  As far as I can tell her angina was a dull low retrosternal/epigastric discomfort that she had as a precursor to her bypass surgery.  Her current chest pain is fairly random and is nonexertional.  She has lost even more weight since I last saw her, due to poor appetite.  Her BMI is now 18.  However, she is actually gained about 3 pounds since September.  Her blood pressure was quite high when she first checked in today, but when I rechecked it was down to 145/81.  At home her blood pressure continues to be quite variable.  About a month ago in clinic her blood pressure was 130/70.  She reports that her blood pressure at home remains extremely variable and sometimes the systolic blood pressure will be in the double digits.  Her most recent hemoglobin A1c was 5.9% and her most recent LDL cholesterol, also checked in September was 66.  She continues to have an excellent HDL at 67.  She denies shortness of breath and the cough does not bother her much anymore.  She does not have orthopnea PND lower extremity edema  denies claudication.  She is chronically constipated.  She had an angiogram in June 2021, about a year after her bypass procedure.  This showed a moderate-severe ostial left main stenosis with severe stenosis in the mid LAD and a patent SVG to the diagonal artery that fills the LAD, as well as severe stenosis of the mid circumflex coronary artery, patent sequential SVG to OM1-OM 2, mild disease in the mid RCA.  The LIMA was a atretic.  Renal duplex ultrasound in 2013 repeated in 2024 did not show renal artery stenosis. Pre-bypass carotid scan showed Pittman evidence of significant stenoses and there was antegrade flow in both vertebral arteries.  She has normal left ventricular systolic function and has diastolic dysfunction which appears appropriate for age (most recent echo September 2023).  She had a barium swallow that appeared to show normal findings.  She has had a cholecystectomy.   Past Medical History:  Diagnosis Date   Anxiety    Arthritis    Chest pain 2011   CARDIOLITE - Pittman significant symptoms, EKG changes, or arrhythmias   Congenital prolapse of bladder mucosa    Diverticulosis    Dyspnea 02/16/2012   2D ECHO - EF 55-60%, normal   Esophageal dysphagia    Fatigue    GERD (gastroesophageal reflux disease)    Glaucoma    Headache(784.0)    Heart attack (HCC)    Hiatal hernia  High blood pressure 02/16/2012   RENAL DOPPLER - normal   Hyperlipidemia    Hypothyroidism    Labile hypertension    Lightheadedness    Migraine headache    Multiple allergies    Myalgia    NSTEMI (non-ST elevated myocardial infarction) (HCC) 07/14/2020   OSA (obstructive sleep apnea)    Pain, lower leg    Calf   Problem of menstruation    Proteinuria    Reflux    SOB (shortness of breath)    Thyroid disease    Urinary problem    Valvular heart disease    Vitamin B12 deficiency    Vitamin D deficiency     Past Surgical History:  Procedure Laterality Date   ABDOMINAL HYSTERECTOMY  1976    CHOLECYSTECTOMY  2000   COLONOSCOPY  07/27/2014   Moderate predominantly sigmoid divertuculosis. Otherwise normal colonoscopy   CORONARY ARTERY BYPASS GRAFT N/A 07/10/2018   Procedure: CORONARY ARTERY BYPASS GRAFTING (CABG), FREE LIMA;  Surgeon: [name], MD;  Location: [facility] OR;  Service: Open Heart Surgery;  Laterality: N/A;   CYST REMOVAL NECK     CYSTECTOMY  1974   Intestine   CYSTECTOMY  1996   Brain stem   ESOPHAGOGASTRODUODENOSCOPY  12/07/2016   Schatzki's ring status post esophageal dilatation. Small hiatal hernia. Mild gastritis   EYE SURGERY  2003   EYE SURGERY  07/2016   LEFT HEART CATH AND CORONARY ANGIOGRAPHY N/A 07/08/2018   Procedure: LEFT HEART CATH AND CORONARY ANGIOGRAPHY;  Surgeon: Runell Gess, MD;  Location: [facility] INVASIVE CV LAB;  Service: Cardiovascular;  Laterality: N/A;   LEFT HEART CATH AND CORONARY ANGIOGRAPHY N/A 07/25/2019   Procedure: LEFT HEART CATH AND CORONARY ANGIOGRAPHY;  Surgeon: Kathleene Hazel, MD;  Location: [facility] INVASIVE CV [facility];  Service: Cardiovascular;  Laterality: N/A;   MOUTH SURGERY  2013   RADIOLOGY WITH ANESTHESIA N/A 11/05/2019   Procedure: MRI WITH ANESTHESIA;  Surgeon: Radiologist, Medication, MD;  Location: [facility] OR;  Service: Radiology;  Laterality: N/A;   TEE WITHOUT CARDIOVERSION N/A 07/10/2018   Procedure: TRANSESOPHAGEAL ECHOCARDIOGRAM (TEE);  Surgeon: [name], MD;  Location: [facility] OR;  Service: Open Heart Surgery;  Laterality: N/A;   TONSILLECTOMY     age 75    Current Medications: Outpatient Medications Prior to Visit  Medication Sig Dispense Refill   aspirin 81 MG tablet Take 81 mg by mouth daily.     Budeson-Glycopyrrol-Formoterol (BREZTRI AEROSPHERE) 160-9-4.8 MCG/ACT AERO Inhale 2 puffs into the lungs 2 (two) times daily as needed (for flares). 10.7 g 11   Cholecalciferol (VITAMIN D3 SUPER STRENGTH PO) Take 1,000 Units by mouth daily after lunch.     dorzolamide-timolol (COSOPT) 22.3-6.8 MG/ML ophthalmic solution  Place 1 drop into the left eye 2 (two) times daily.      latanoprost (XALATAN) 0.005 % ophthalmic solution Place 1 drop into the left eye at bedtime.     levothyroxine (SYNTHROID) 88 MCG tablet Take 1 tablet (88 mcg total) by mouth daily. 90 tablet 3   olmesartan (BENICAR) 20 MG tablet TAKE 1 TABLET BY MOUTH EVERY DAY 90 tablet 2   albuterol (VENTOLIN HFA) 108 (90 Base) MCG/ACT inhaler Inhale 2 puffs into the lungs. (Patient not taking: Reported on 12/25/2022)     gabapentin (NEURONTIN) 100 MG capsule Take 1 capsule (100 mg total) by mouth at bedtime. (Patient not taking: Reported on 12/25/2022) 30 capsule 2   rosuvastatin (CRESTOR) 20 MG tablet Take 1 tablet (20  mg total) by mouth every evening. (Patient not taking: Reported on 12/25/2022) 90 tablet 3   Pittman facility-administered medications prior to visit.     Allergies:   Benadryl [diphenhydramine], Lipase concentrate-hp [digestive enzymes], Zenpep [pancrelipase (lip-prot-amyl)], Codeine, Meperidine, Other, Prednisone, Promethazine, Propranolol, Shellfish allergy, Tape, Tazarotene, and Iodinated contrast media   Social History   Socioeconomic History   Marital status: Married    Spouse name: Not on file   Number of children: 3   Years of education: college   Highest education level: Not on file  Occupational History   Occupation: Retired  Tobacco Use   Smoking status: Never   Smokeless tobacco: Never  Vaping Use   Vaping status: Never Used  Substance and Sexual Activity   Alcohol use: Pittman   Drug use: Pittman   Sexual activity: Not on file  Other Topics Concern   Not on file  Social History Narrative   Lives with husband in [place], [place].   Right-handed.   Pittman daily caffeine use.   Social Determinants of Health   Financial Resource Strain: Medium Risk (10/19/2021)   Overall Financial Resource Strain (CARDIA)    Difficulty of Paying Living Expenses: Somewhat hard  Food Insecurity: Pittman Food Insecurity (10/19/2021)   Hunger Vital Sign     Worried About Running Out of Food in the Last Year: Never true    Ran Out of Food in the Last Year: Never true  Transportation Needs: Pittman Transportation Needs (05/09/2022)   PRAPARE - Administrator, Civil Service (Medical): Pittman    Lack of Transportation (Non-Medical): Pittman  Physical Activity: Inactive (05/09/2022)   Exercise Vital Sign    Days of Exercise per Week: 0 days    Minutes of Exercise per Session: 0 min  Stress: Pittman Stress Concern Present (05/11/2021)   [employer] of Occupational Health - Occupational Stress Questionnaire    Feeling of Stress : Only a little  Social Connections: Moderately Integrated (04/19/2022)   Social Connection and Isolation Panel [NHANES]    Frequency of Communication with Friends and Family: Once a week    Frequency of Social Gatherings with Friends and Family: Twice a week    Attends Religious Services: More than 4 times per year    Active Member of [employer] or Organizations: Pittman    Attends Engineer, structural: Never    Marital Status: Married     Family History:  The patient's family history includes Asthma in her brother; Cancer in her brother, brother, and sister; Heart attack in her father; Other in her mother; Stroke in her father.   ROS:   Please see the history of present illness.    ROS All other systems reviewed and are negative.   PHYSICAL EXAM:   VS:  BP (!) 145/81   Pulse 67   Ht 5\' 4"  (1.626 m)   Wt 108 lb (49 kg)   SpO2 98%   BMI 18.54 kg/m     General: Alert, oriented x3, Pittman distress, elderly, frail, very slender Head: Pittman evidence of trauma, PERRL, EOMI, Pittman exophtalmos or lid lag, Pittman myxedema, Pittman xanthelasma; normal ears, nose and oropharynx Neck: normal jugular venous pulsations and Pittman hepatojugular reflux; brisk carotid pulses without delay and Pittman carotid bruits Chest: clear to auscultation, Pittman signs of consolidation by percussion or palpation, normal fremitus, symmetrical and full respiratory  excursions Cardiovascular: normal position and quality of the apical impulse, regular rhythm, normal first and second heart sounds, Pittman murmurs,  rubs or gallops Abdomen: Pittman tenderness or distention, Pittman masses by palpation, Pittman abnormal pulsatility or arterial bruits, normal bowel sounds, Pittman hepatosplenomegaly Extremities: Pittman clubbing, cyanosis or edema; 2+ radial, ulnar and brachial pulses bilaterally; 2+ right femoral, posterior tibial and dorsalis pedis pulses; 2+ left femoral, posterior tibial and dorsalis pedis pulses; Pittman subclavian or femoral bruits Neurological: grossly nonfocal Psych: Normal mood and affect      Wt Readings from Last 3 Encounters:  12/25/22 108 lb (49 kg)  11/07/22 105 lb (47.6 kg)  10/03/22 106 lb (48.1 kg)    Studies/Labs Reviewed:  CT Chest Jun 10, 2020 Small sliding-type hiatal hernia. Calcified right hilar lymph nodes are noted consistent with prior granulomatous disease. Bilateral scarring and bronchiectasis is noted. Pittman acute pulmonary abnormality is noted. Aortic Atherosclerosis   ECHO 11/17/2021  1. Left ventricular ejection fraction, by estimation, is 60 to 65%. The  left ventricle has normal function. The left ventricle has Pittman regional  wall motion abnormalities. Left ventricular diastolic parameters were  normal.   2. Right ventricular systolic function is normal. The right ventricular  size is normal.   3. The mitral valve is normal in structure. Trivial mitral valve  regurgitation. Pittman evidence of mitral stenosis.   4. The aortic valve is tricuspid. Aortic valve regurgitation is mild.  Aortic valve sclerosis is present, with Pittman evidence of aortic valve  stenosis.   5. The inferior vena cava is normal in size with greater than 50%  respiratory variability, suggesting right atrial pressure of 3 mmHg.   Cardiac catheterization 07/25/2019  Prox RCA to Mid RCA lesion is 40% stenosed. Ost LM to Mid LM lesion is 60% stenosed. Mid Cx lesion is 99%  stenosed. SVG graft was visualized by angiography and is normal in caliber. The graft exhibits Pittman disease. SVG graft was visualized by angiography and is normal in caliber. The graft exhibits Pittman disease. Mid LAD lesion is 70% stenosed.   Severe double vessel CAD Moderately severe ostial left main stenosis Severe stenosis mid LAD. The SVG to the LAD is patent. The LIMA does not appear to have been used despite the findings in the operative report. The LIMA is small and atretic.  Severe stenosis mid Circumflex. OM and OM2 fill from the patent vein graft.  Mild disease mid RCA   Recommendations: Continue medical management of CAD. Will start lactulose for constipation. She could be discharged later today after bedrest if she is stable.   Dominance: Right    EKG:    EKG Interpretation Date/Time:  Monday December 25 2022 14:53:07 EST Ventricular Rate:  67 PR Interval:  164 QRS Duration:  70 QT Interval:  402 QTC Calculation: 424 R Axis:   38  Text Interpretation: Normal sinus rhythm Nonspecific ST and T wave abnormality When compared with ECG of 05-Jan-2021 11:59, Pittman significant change since last tracing Confirmed by Denzil Bristol ([id number]) on 12/25/2022 3:15:23 PM       Lipid Panel     Component Value Date/Time   CHOL 149 11/07/2022 1045   TRIG 84 11/07/2022 1045   HDL 67 11/07/2022 1045   CHOLHDL 2.2 11/07/2022 1045   CHOLHDL 3.0 11/04/2019 0617   VLDL 17 11/04/2019 0617   LDLCALC 66 11/07/2022 1045   LABVLDL 16 11/07/2022 1045   ASSESSMENT:    1. Coronary artery disease involving coronary bypass graft of native heart without angina pectoris   2. Chest pain, unspecified type   3. Postoperative atrial fibrillation (HCC)   4.  Essential hypertension   5. Hyperlipidemia with target LDL less than 70   6. Statin myopathy   7. Atherosclerosis of aorta (HCC)       PLAN:  In order of problems listed above:  CAD s/p CABG: He is always complaining of some type of  further chest pain, but her current issues sounds different from her previous angina.  Reportedly she had an atretic LIMA, but had good flow to the LAD from the diagonal graft when she had a cardiac catheterization 2021.  Echocardiogram performed less than a year ago shows normal systolic and diastolic function.  Suspect that her chest symptoms are due to GERD.  Recommend over-the-counter H2 blocker such as famotidine 20 mg daily. Postop AFib: This only occurred in the immediate postop period and has not recurred.  Not on anticoagulation. HTN: Blood pressure remains highly variable.  Pittman evidence of renal artery stenosis.  Acceptable control in the last couple of months when seen in clinic.  Blood pressure should always be checked after she has had plenty of time to relax.  Nevertheless at home she sometimes has low blood pressure becomes symptomatic if she takes the entire olmesartan tablet.  Will dose her blood pressure medications depending on her morning BP.  If her systolic blood pressures less than 110 she will only take a quarter of her olmesartan tablet (5 mg).  If her blood pressure is 110-150 she will take 10 mg olmesartan and if her blood pressure is over 150 she will take the entire 20 mg tablet. HLP: Excellent response to rosuvastatin with a greater than 50% reduction in her LDL cholesterol, but she has stopped this medication due to myopathy.  Will refer her to our clinical pharmacist to start Repatha. Atherosclerosis of the aorta: Normal caliber aorta.      Medication Adjustments/Labs and Tests Ordered: Current medicines are reviewed at length with the patient today.  Concerns regarding medicines are outlined above.  Medication changes, Labs and Tests ordered today are listed in the Patient Instructions below. Patient Instructions  Medication Instructions:  - FAMOTIDINE 20mg  daily (can get over-the-counter) - Repatha 140mg  every 2 weeks - Olmesartan 20mg  Dose depends on MORNING  bloodpressure For systolic blood pressure  <110 take 1/4 pill (5mg ) 110-150 take 1/2 pill (10mg ) >150 take whole pill (20mg )   *If you need a refill on your cardiac medications before your next appointment, please call your pharmacy*   Lab Work: Your physician recommends that you return for lab work in: 3 months   If you have labs (blood work) drawn today and your tests are completely normal, you will receive your results only by: MyChart Message (if you have MyChart) OR A paper copy in the mail If you have any lab test that is abnormal or we need to change your treatment, we will call you to review the results.   Testing/Procedures: NONE   Follow-Up: At [facility], you and your health needs are our priority.  As part of our continuing mission to provide you with exceptional heart care, we have created designated Provider Care Teams.  These Care Teams include your primary Cardiologist (physician) and Advanced Practice Providers (APPs -  Physician Assistants and Nurse Practitioners) who all work together to provide you with the care you need, when you need it.  We recommend signing up for the patient portal called "MyChart".  Sign up information is provided on this After Visit Summary.  MyChart is used to connect with patients for Virtual  Visits (Telemedicine).  Patients are able to view lab/test results, encounter notes, upcoming appointments, etc.  Non-urgent messages can be sent to your provider as well.   To learn more about what you can do with MyChart, go to [email or web address].    Your next appointment:   6 month(s)  The format for your next appointment:   In Person  Provider:   Thurmon Fair, MD    Other Instructions Dr. Royann Shivers has referred you to lipid clinic for Repatha    Signed, Leah Fair, MD  12/28/2022 2:02 PM    [facility] [address] [place], [place], [place]  [address] Phone: [phone]; Fax: [phone]

## 2022-12-28 ENCOUNTER — Telehealth: Payer: Self-pay | Admitting: Cardiovascular Disease

## 2022-12-28 ENCOUNTER — Encounter: Payer: Self-pay | Admitting: Cardiovascular Disease

## 2022-12-28 DIAGNOSIS — E785 Hyperlipidemia, unspecified: Secondary | ICD-10-CM

## 2022-12-28 DIAGNOSIS — I25810 Atherosclerosis of coronary artery bypass graft(s) without angina pectoris: Secondary | ICD-10-CM

## 2022-12-28 NOTE — Telephone Encounter (Signed)
Called patient to schedule a PharmD appointment for Lipids. No answer, left message to call for an appointment.   Put in order- referral to pharmD- for lipids/ to be started on Repatha.

## 2022-12-28 NOTE — Telephone Encounter (Signed)
Spoke with patient and she states she was supposed have Rx for reflux medication sent to the pharmacy but has not been called in. Per your OV note she can can Famotidine 20 mg OTC. She states she will get it OTC.   She also states you told her she will need to talk to the pharmacist about a new cholesterol medication you wanted her to take. She states her pharmacist advised to discuss with her provider.   In your note you want her to start repatha. Did you want her to have referral for pharmD here in clinic?

## 2022-12-28 NOTE — Telephone Encounter (Signed)
Pt said that doctor was going to call her in a medication for heartburn but no medicine has been called in. Also wants to talk about another medicine he wants her to take and doctor told her to talk to the pharmacy about it, but the pharmacy told her the doctor has to start the medication first. Pt does not know the name of either medication. Pt would like a call back

## 2023-01-02 NOTE — Telephone Encounter (Signed)
Called to make appt for PharmD- Repatha. Her husband is at dinner and she needs to ask him about his schedule. She asked me to call her back in a little bit.

## 2023-01-03 ENCOUNTER — Ambulatory Visit: Payer: Medicare Other | Attending: Cardiology | Admitting: Pharmacist Clinician (PhC)/ Clinical Pharmacy Specialist

## 2023-01-03 DIAGNOSIS — E782 Mixed hyperlipidemia: Secondary | ICD-10-CM | POA: Insufficient documentation

## 2023-01-03 NOTE — Patient Instructions (Signed)
Your Results:             Your most recent labs Goal  Total Cholesterol 149 < 200  Triglycerides 84 < 150  HDL (happy/good cholesterol) 67 > 40  LDL (lousy/bad cholesterol 66 < 70   We will have you repeat cholesterol labs in early January.  Once we get those results I will call you and we can decide between Repatha (injections at home every 2 weeks) or Leqvio (injections in office every 3 months x 2 then every 6 months.    We should be able to get either medication for you at no charge, based on your insurance Leah Pittman) and charitable grants (Repatha)  (charitable grant is income dependent - < $102,000 for a family of 2)   Thank you for choosing [facility]

## 2023-01-03 NOTE — Progress Notes (Signed)
Office Visit    Patient Name: Leah Pittman Date of Encounter: 01/04/2023  Primary Care Provider:  Blane Ohara, MD Primary Cardiologist:  Thurmon Fair, MD  Chief Complaint    Hyperlipidemia   Significant Past Medical History   CAD 2020 CABG x 4; NSTEMI 5/22  HTN Labile pressures, on olmesartan 20  hypothyroid 9/24 TSH, T4 both WNL, on levothyroxine 88 mcg  preDM A1c 5.9        Allergies  Allergen Reactions   Benadryl [Diphenhydramine] Swelling and Other (See Comments)    Tongue swells and hallucinates- could not talk   Lipase Concentrate-Hp [Digestive Enzymes] Rash   Zenpep [Pancrelipase (Lip-Prot-Amyl)] Rash   Codeine Nausea And Vomiting and Hypertension   Meperidine Nausea Only, Swelling and Other (See Comments)    Tongue swells and "I feel like death"   Other Other (See Comments)    SKIN IS VERY SENSITIVE AND IT TEARS EASILY!!   Prednisone Hypertension   Promethazine Other (See Comments)    Hypotension    Propranolol Nausea And Vomiting   Shellfish Allergy Nausea And Vomiting   Tape Other (See Comments)    Tears the skin   Tazarotene Other (See Comments)    Reaction not recalled- "Tazarotene, commonly marketed as Tazorac, Avage, and Zorac, is member of the acetylenic class of retinoids."    Iodinated Contrast Media Nausea Only and Rash    History of Present Illness    Leah Pittman is a 89 y.o. female patient of Dr Royann Shivers, in the office today to discuss options for cholesterol management.  She was previously on rosuvastatin, but stopped about a month ago because of myalgias.  Her most recent labs were done in September, while on the medication, and showed her LDL to be at 66.    Insurance Carrier: [employer] Medicare  LDL Cholesterol goal:  LDL < 70  Current Medications:   none  Previously tried:  rosuvastatin, simvastatin - myalgias  Family Hx:   father and brother both had MI's  Social Hx: Tobacco: no Alcohol: no  Diet:    eats out  most days, leftovers at home; protein is fish and chicken, greens and green beans, various beans; some Ensure, doesn't snack much  Exercise: not currently   Lab Results  Component Value Date   CHOL 149 11/07/2022   HDL 67 11/07/2022   LDLCALC 66 11/07/2022   TRIG 84 11/07/2022   CHOLHDL 2.2 11/07/2022    No results found for: "LIPOA"  Lab Results  Component Value Date   ALT 24 11/07/2022   AST 20 11/07/2022   ALKPHOS 73 11/07/2022   BILITOT 0.5 11/07/2022   Lab Results  Component Value Date   CREATININE 0.79 11/07/2022   BUN 16 11/07/2022   NA 140 11/07/2022   K 4.6 11/07/2022   CL 104 11/07/2022   CO2 23 11/07/2022   Lab Results  Component Value Date   HGBA1C 5.9 (H) 12/08/2021    Home Medications    Current Outpatient Medications  Medication Sig Dispense Refill   albuterol (VENTOLIN HFA) 108 (90 Base) MCG/ACT inhaler Inhale 2 puffs into the lungs.     aspirin 81 MG tablet Take 81 mg by mouth daily.     Budeson-Glycopyrrol-Formoterol (BREZTRI AEROSPHERE) 160-9-4.8 MCG/ACT AERO Inhale 2 puffs into the lungs 2 (two) times daily as needed (for flares). 10.7 g 11   Cholecalciferol (VITAMIN D3 SUPER STRENGTH PO) Take 1,000 Units by mouth daily after lunch.  dorzolamide-timolol (COSOPT) 22.3-6.8 MG/ML ophthalmic solution Place 1 drop into the left eye 2 (two) times daily.      latanoprost (XALATAN) 0.005 % ophthalmic solution Place 1 drop into the left eye at bedtime.     levothyroxine (SYNTHROID) 88 MCG tablet Take 1 tablet (88 mcg total) by mouth daily. 90 tablet 3   olmesartan (BENICAR) 20 MG tablet TAKE 1 TABLET BY MOUTH EVERY DAY 90 tablet 2   No current facility-administered medications for this visit.     Assessment & Plan    Mixed hyperlipidemia Assessment: Patient with ASCVD currently at LDL goal of < 70 Stopped rosuvastatin in past month 2/2 myalgias.  Not able to tolerate statins secondary to myalgias - rosuvastatin, simvastatin Reviewed options for  lowering LDL cholesterol, including ezetimibe, PCSK-9 inhibitors, bempedoic acid and inclisiran.  Discussed mechanisms of action, dosing, side effects, potential decreases in LDL cholesterol and costs.  Also reviewed potential options for patient assistance.  Plan: Will need to repeat labs in January to determine baseline LDL without medication Once results are in, will determine if Repatha or Leqvio would be the better choice Repeat labs in January Lipid    Phillips Hay, PharmD CPP [facility] [address]  [place], [place] [address] [phone]  01/04/2023, 10:32 AM

## 2023-01-04 ENCOUNTER — Encounter: Payer: Self-pay | Admitting: Pharmacist Clinician (PhC)/ Clinical Pharmacy Specialist

## 2023-01-04 NOTE — Assessment & Plan Note (Signed)
Assessment: Patient with ASCVD currently at LDL goal of < 70 Stopped rosuvastatin in past month 2/2 myalgias.  Not able to tolerate statins secondary to myalgias - rosuvastatin, simvastatin Reviewed options for lowering LDL cholesterol, including ezetimibe, PCSK-9 inhibitors, bempedoic acid and inclisiran.  Discussed mechanisms of action, dosing, side effects, potential decreases in LDL cholesterol and costs.  Also reviewed potential options for patient assistance.  Plan: Will need to repeat labs in January to determine baseline LDL without medication Once results are in, will determine if Repatha or Leqvio would be the better choice Repeat labs in January Lipid

## 2023-01-25 ENCOUNTER — Ambulatory Visit (INDEPENDENT_AMBULATORY_CARE_PROVIDER_SITE_OTHER): Payer: Medicare Other | Admitting: Family Medicine

## 2023-01-25 ENCOUNTER — Encounter: Payer: Self-pay | Admitting: Family Medicine

## 2023-01-25 VITALS — BP 122/58 | HR 83 | Temp 98.0°F | Resp 18 | Ht 64.0 in | Wt 105.0 lb

## 2023-01-25 DIAGNOSIS — H66002 Acute suppurative otitis media without spontaneous rupture of ear drum, left ear: Secondary | ICD-10-CM

## 2023-01-25 DIAGNOSIS — J0100 Acute maxillary sinusitis, unspecified: Secondary | ICD-10-CM | POA: Diagnosis not present

## 2023-01-25 DIAGNOSIS — J029 Acute pharyngitis, unspecified: Secondary | ICD-10-CM | POA: Diagnosis not present

## 2023-01-25 HISTORY — DX: Acute suppurative otitis media without spontaneous rupture of ear drum, left ear: H66.002

## 2023-01-25 LAB — POCT RAPID STREP A (OFFICE): Rapid Strep A Screen: NEGATIVE

## 2023-01-25 MED ORDER — AMOXICILLIN-POT CLAVULANATE 875-125 MG PO TABS
1.0000 | ORAL_TABLET | Freq: Two times a day (BID) | ORAL | 0 refills | Status: DC
Start: 2023-01-25 — End: 2023-01-29

## 2023-01-25 NOTE — Progress Notes (Signed)
Acute Office Visit  Subjective:    Patient ID: Leah Pittman, female    DOB: August 20, 1930, 89 y.o.   MRN: [id number]  Chief Complaint  Patient presents with   URI    HPI:  URI: Patient is in today for cough, ear ache, sore throat, and head congestion.  Symptoms started last Tuesday. The patient, with a history of heart surgery, glaucoma, and high blood pressure, presents with a prolonged upper respiratory infection that started nine days ago. The patient also reports a constant need to blow their nose, using up two boxes of tissues. The patient describes a large amount of mucus, which they believe may be due to sinus issues. The mucus is sometimes clear, but most of the time it is yellow. The patient also reports coughing up yellow mucus from their chest. She uses her Breztri inhaler twice a day and an albuterol inhaler as needed for shortness of breath.    Left ear pain: The symptoms began with an earache on the left side, which was accompanied by occasional twinges of pain on the right side. The earache was severe enough to require the use of a heating pad. The patient also experienced nasal congestion, which initially drained but has since stopped. The patient describes the ear draining after she used a warm compress and believes it is still draining slightly. The patient has been feeling weak and has had a decreased appetite. They have been experiencing nausea and stomach pain, but no vomiting. The patient has been taking Tylenol for the discomfort and Mucinex liquid for the cough and mucus production. However, the Mucinex caused severe stomach discomfort and the patient has stopped taking it.   Past Medical History:  Diagnosis Date   Anxiety    Arthritis    Chest pain 2011   CARDIOLITE - no significant symptoms, EKG changes, or arrhythmias   Congenital prolapse of bladder mucosa    Diverticulosis    Dyspnea 02/16/2012   2D ECHO - EF 55-60%, normal   Esophageal dysphagia     Fatigue    GERD (gastroesophageal reflux disease)    Glaucoma    Headache(784.0)    Heart attack (HCC)    Hiatal hernia    High blood pressure 02/16/2012   RENAL DOPPLER - normal   Hyperlipidemia    Hypothyroidism    Labile hypertension    Lightheadedness    Migraine headache    Multiple allergies    Myalgia    NSTEMI (non-ST elevated myocardial infarction) (HCC) 07/14/2020   OSA (obstructive sleep apnea)    Pain, lower leg    Calf   Problem of menstruation    Proteinuria    Reflux    SOB (shortness of breath)    Thyroid disease    Urinary problem    Valvular heart disease    Vitamin B12 deficiency    Vitamin D deficiency     Past Surgical History:  Procedure Laterality Date   ABDOMINAL HYSTERECTOMY  1976   CHOLECYSTECTOMY  2000   COLONOSCOPY  07/27/2014   Moderate predominantly sigmoid divertuculosis. Otherwise normal colonoscopy   CORONARY ARTERY BYPASS GRAFT N/A 07/10/2018   Procedure: CORONARY ARTERY BYPASS GRAFTING (CABG), FREE LIMA;  Surgeon: [name], MD;  Location: [facility] OR;  Service: Open Heart Surgery;  Laterality: N/A;   CYST REMOVAL NECK     CYSTECTOMY  1974   Intestine   CYSTECTOMY  1996   Brain stem   ESOPHAGOGASTRODUODENOSCOPY  12/07/2016  Schatzki's ring status post esophageal dilatation. Small hiatal hernia. Mild gastritis   EYE SURGERY  2003   EYE SURGERY  07/2016   LEFT HEART CATH AND CORONARY ANGIOGRAPHY N/A 07/08/2018   Procedure: LEFT HEART CATH AND CORONARY ANGIOGRAPHY;  Surgeon: Runell Gess, MD;  Location: [facility] INVASIVE CV LAB;  Service: Cardiovascular;  Laterality: N/A;   LEFT HEART CATH AND CORONARY ANGIOGRAPHY N/A 07/25/2019   Procedure: LEFT HEART CATH AND CORONARY ANGIOGRAPHY;  Surgeon: Kathleene Hazel, MD;  Location: [facility] INVASIVE CV [facility];  Service: Cardiovascular;  Laterality: N/A;   MOUTH SURGERY  2013   RADIOLOGY WITH ANESTHESIA N/A 11/05/2019   Procedure: MRI WITH ANESTHESIA;  Surgeon: Radiologist, Medication, MD;   Location: [facility] OR;  Service: Radiology;  Laterality: N/A;   TEE WITHOUT CARDIOVERSION N/A 07/10/2018   Procedure: TRANSESOPHAGEAL ECHOCARDIOGRAM (TEE);  Surgeon: [name], MD;  Location: [facility] OR;  Service: Open Heart Surgery;  Laterality: N/A;   TONSILLECTOMY     age 27    Family History  Problem Relation Age of Onset   Other Mother        blood disorder - unsure of name   Heart attack Father    Stroke Father    Cancer Sister    Cancer Brother    Asthma Brother    Cancer Brother     Social History   Socioeconomic History   Marital status: Married    Spouse name: Not on file   Number of children: 3   Years of education: college   Highest education level: Not on file  Occupational History   Occupation: Retired  Tobacco Use   Smoking status: Never   Smokeless tobacco: Never  Vaping Use   Vaping status: Never Used  Substance and Sexual Activity   Alcohol use: No   Drug use: No   Sexual activity: Not on file  Other Topics Concern   Not on file  Social History Narrative   Lives with husband in [place], [place].   Right-handed.   No daily caffeine use.   Social Determinants of Health   Financial Resource Strain: Medium Risk (10/19/2021)   Overall Financial Resource Strain (CARDIA)    Difficulty of Paying Living Expenses: Somewhat hard  Food Insecurity: No Food Insecurity (10/19/2021)   Hunger Vital Sign    Worried About Running Out of Food in the Last Year: Never true    Ran Out of Food in the Last Year: Never true  Transportation Needs: No Transportation Needs (05/09/2022)   PRAPARE - Administrator, Civil Service (Medical): No    Lack of Transportation (Non-Medical): No  Physical Activity: Inactive (05/09/2022)   Exercise Vital Sign    Days of Exercise per Week: 0 days    Minutes of Exercise per Session: 0 min  Stress: No Stress Concern Present (05/11/2021)   [employer] of Occupational Health - Occupational Stress Questionnaire    Feeling of Stress  : Only a little  Social Connections: Moderately Integrated (04/19/2022)   Social Connection and Isolation Panel [NHANES]    Frequency of Communication with Friends and Family: Once a week    Frequency of Social Gatherings with Friends and Family: Twice a week    Attends Religious Services: More than 4 times per year    Active Member of [employer] or Organizations: No    Attends Banker Meetings: Never    Marital Status: Married  Catering manager Violence: Not At Risk (05/11/2021)  Humiliation, Afraid, Rape, and Kick questionnaire    Fear of Current or Ex-Partner: No    Emotionally Abused: No    Physically Abused: No    Sexually Abused: No    Outpatient Medications Prior to Visit  Medication Sig Dispense Refill   albuterol (VENTOLIN HFA) 108 (90 Base) MCG/ACT inhaler Inhale 2 puffs into the lungs.     aspirin 81 MG tablet Take 81 mg by mouth daily.     Budeson-Glycopyrrol-Formoterol (BREZTRI AEROSPHERE) 160-9-4.8 MCG/ACT AERO Inhale 2 puffs into the lungs 2 (two) times daily as needed (for flares). 10.7 g 11   Cholecalciferol (VITAMIN D3 SUPER STRENGTH PO) Take 1,000 Units by mouth daily after lunch.     dorzolamide-timolol (COSOPT) 22.3-6.8 MG/ML ophthalmic solution Place 1 drop into the left eye 2 (two) times daily.      latanoprost (XALATAN) 0.005 % ophthalmic solution Place 1 drop into the left eye at bedtime.     levothyroxine (SYNTHROID) 88 MCG tablet Take 1 tablet (88 mcg total) by mouth daily. 90 tablet 3   olmesartan (BENICAR) 20 MG tablet TAKE 1 TABLET BY MOUTH EVERY DAY 90 tablet 2   No facility-administered medications prior to visit.    Allergies  Allergen Reactions   Benadryl [Diphenhydramine] Swelling and Other (See Comments)    Tongue swells and hallucinates- could not talk   Lipase Concentrate-Hp [Digestive Enzymes] Rash   Zenpep [Pancrelipase (Lip-Prot-Amyl)] Rash   Codeine Nausea And Vomiting and Hypertension   Meperidine Nausea Only, Swelling and Other  (See Comments)    Tongue swells and "I feel like death"   Other Other (See Comments)    SKIN IS VERY SENSITIVE AND IT TEARS EASILY!!   Prednisone Hypertension   Promethazine Other (See Comments)    Hypotension    Propranolol Nausea And Vomiting   Shellfish Allergy Nausea And Vomiting   Tape Other (See Comments)    Tears the skin   Tazarotene Other (See Comments)    Reaction not recalled- "Tazarotene, commonly marketed as Tazorac, Avage, and Zorac, is member of the acetylenic class of retinoids."    Iodinated Contrast Media Nausea Only and Rash    Review of Systems  Constitutional:  Positive for appetite change (loss) and fatigue. Negative for fever.  HENT:  Positive for congestion, ear pain, sinus pain and sore throat.   Eyes:  Positive for discharge and redness.  Respiratory:  Positive for cough (productive) and shortness of breath.   Gastrointestinal:  Positive for abdominal pain and nausea. Negative for constipation, diarrhea and vomiting.  Genitourinary:  Negative for dysuria, frequency and urgency.  Neurological:  Positive for weakness. Negative for headaches.       Objective:        01/25/2023    8:59 AM 12/25/2022    2:52 PM 12/25/2022    2:46 PM  Vitals with BMI  Height 5\' 4"   5\' 4"   Weight 105 lbs  108 lbs  BMI 18.01  18.53  Systolic 122 145 629  Diastolic 58 81 78  Pulse 83  67    No data found.   Physical Exam Constitutional:      General: She is not in acute distress.    Appearance: Normal appearance. She is ill-appearing.  HENT:     Head: Normocephalic.     Right Ear: Tympanic membrane normal. No decreased hearing noted.     Left Ear: No decreased hearing noted. Tenderness present. Tympanic membrane is erythematous.  Eyes:  Comments: watering  Cardiovascular:     Rate and Rhythm: Normal rate and regular rhythm.     Heart sounds: Normal heart sounds.  Pulmonary:     Effort: Pulmonary effort is normal.     Breath sounds: Normal breath  sounds. No wheezing.  Abdominal:     General: Bowel sounds are normal.     Palpations: Abdomen is soft.     Tenderness: There is abdominal tenderness.  Neurological:     Mental Status: She is alert and oriented to person, place, and time.  Psychiatric:        Mood and Affect: Mood normal.        Behavior: Behavior normal.     Health Maintenance Due  Topic Date Due   Zoster Vaccines- Shingrix (1 of 2) Never done   Pneumonia Vaccine 51+ Years old (1 of 1 - PCV) Never done   Medicare Annual Wellness (AWV)  05/12/2022   INFLUENZA VACCINE  Never done    There are no preventive care reminders to display for this patient.   Lab Results  Component Value Date   TSH 0.902 11/07/2022   Lab Results  Component Value Date   WBC 7.1 11/07/2022   HGB 12.8 11/07/2022   HCT 38.6 11/07/2022   MCV 99 (H) 11/07/2022   PLT 232 11/07/2022   Lab Results  Component Value Date   NA 140 11/07/2022   K 4.6 11/07/2022   CO2 23 11/07/2022   GLUCOSE 98 11/07/2022   BUN 16 11/07/2022   CREATININE 0.79 11/07/2022   BILITOT 0.5 11/07/2022   ALKPHOS 73 11/07/2022   AST 20 11/07/2022   ALT 24 11/07/2022   PROT 6.9 11/07/2022   ALBUMIN 4.6 11/07/2022   CALCIUM 10.2 11/07/2022   ANIONGAP 7 01/05/2021   EGFR 71 11/07/2022   GFR 60.94 01/27/2020   Lab Results  Component Value Date   CHOL 149 11/07/2022   Lab Results  Component Value Date   HDL 67 11/07/2022   Lab Results  Component Value Date   LDLCALC 66 11/07/2022   Lab Results  Component Value Date   TRIG 84 11/07/2022   Lab Results  Component Value Date   CHOLHDL 2.2 11/07/2022   Lab Results  Component Value Date   HGBA1C 5.9 (H) 12/08/2021       Assessment & Plan:  Acute pharyngitis, unspecified etiology Assessment & Plan: Strep test - negative -Encouraged patient to gargle with salt water -drink hot tea  -drink plenty of fluids -practice good hand hygeine -Throw away your toothbrush and start using a new  one  Orders: -     POCT rapid strep A  Non-recurrent acute suppurative otitis media of left ear without spontaneous rupture of tympanic membrane Assessment & Plan: Acute -Augmentin 875-125 mg tablet by mouth TWICE A DAY for 10 days     Orders: -     Amoxicillin-Pot Clavulanate; Take 1 tablet by mouth 2 (two) times daily.  Dispense: 20 tablet; Refill: 0  Acute non-recurrent maxillary sinusitis     Meds ordered this encounter  Medications   amoxicillin-clavulanate (AUGMENTIN) 875-125 MG tablet    Sig: Take 1 tablet by mouth 2 (two) times daily.    Dispense:  20 tablet    Refill:  0    Orders Placed This Encounter  Procedures   Rapid Strep A     Follow-up: Return if symptoms worsen or fail to improve, for keep appt w Dr. Sedalia Muta on 12.19.24.  An After Visit Summary was printed and given to the patient.  Total time spent on today's visit was greater than 30 minutes, including both face-to-face time and nonface-to-face time personally spent on review of chart (labs and imaging), discussing labs and goals, discussing further work-up, treatment options, referrals to specialist if needed, reviewing outside records of pertinent, answering patient's questions, and coordinating care.   Lajuana Matte, FNP [name] [phone]

## 2023-01-25 NOTE — Assessment & Plan Note (Addendum)
Strep test - negative -Encouraged patient to gargle with salt water -drink hot tea  -drink plenty of fluids -practice good hand hygeine -Throw away your toothbrush and start using a new one

## 2023-01-25 NOTE — Assessment & Plan Note (Addendum)
Acute -Augmentin 875-125 mg tablet by mouth TWICE A DAY for 10 days

## 2023-01-25 NOTE — Assessment & Plan Note (Deleted)
Acute Increase fluid intake, rest and practice good hand hygeine Throw away your toothbrush and start using a new one -Augmentin 875-125 mg tablet by mouth TWICE A DAY for 10 days -Albuterol 2 puffs into the lungs every 6 hours as needed for wheezing or shortness of breath. Rinse your mouth with water afterwards Try lemon and honey and/or cough drops for cough

## 2023-01-29 ENCOUNTER — Telehealth: Payer: Self-pay

## 2023-01-29 ENCOUNTER — Other Ambulatory Visit: Payer: Self-pay | Admitting: Family Medicine

## 2023-01-29 MED ORDER — AMOXICILLIN-POT CLAVULANATE 600-42.9 MG/5ML PO SUSR: 30.0000 mg/kg/d | Freq: Two times a day (BID) | ORAL | 0 refills | Status: DC

## 2023-01-29 NOTE — Telephone Encounter (Signed)
Copied from CRM [id number]. Topic: Clinical - Medication Question >> Jan 29, 2023  2:30 PM Alona Bene A wrote: Reason for CRM: [facility] reached out and stated patient is having trouble swallowing amoxicillin-clavulanate (AUGMENTIN) 875-125 MG tablet. [facility] would like to know if provider can send over something different for patient. They are asking for a smaller pill, or different medication like a liquid suspension. [facility] mentioned they have the Augmentin 875-125 which was previously prescribed on 01/25/23. Please advise what can be done for patient. [facility] can be contacted at [phone]. Fax number to send new prescription is [phone].

## 2023-01-29 NOTE — Telephone Encounter (Signed)
Lajuana Matte, FNP. Made aware will send in liquid medication.

## 2023-01-30 NOTE — Progress Notes (Signed)
This encounter was created in error - please disregard.

## 2023-02-07 NOTE — Progress Notes (Unsigned)
Subjective:  Patient ID: Leah Pittman, female    DOB: 1930/05/26  Age: 89 y.o. MRN: [id number]  Chief Complaint  Patient presents with   Medical Management of Chronic Issues    HPI   Asthma/COPD: taking breztri 2 puffs twice daily, albuterol as needed. Helps her breathing. Throat is always raspy. Continue albuterol.   CORONARY ARTERY DISEASE:  on Crestor 20mg  once daily.    Hypertensive heart disease: on benicar 20 mg 1 tablet daily. Aspirin 81 mg daily.    GERD: currently not taking protonix    Hypothyroidism: on synthroid 88 mcg once daily in am.    Vitamin D: 2000 U once daily.    Glaucoma: on cosopt, xalatan, xelpros.      11/07/2022    9:54 AM 07/20/2022    2:24 PM 06/29/2022    2:13 PM 04/19/2022    1:34 PM 06/29/2021    9:28 AM  Depression screen PHQ 2/9  Decreased Interest 0 0 0 0 0  Down, Depressed, Hopeless 0 0 0 0 0  PHQ - 2 Score 0 0 0 0 0  Altered sleeping   2    Tired, decreased energy   3    Change in appetite   3    Feeling bad or failure about yourself    0    Trouble concentrating   0    Moving slowly or fidgety/restless   0    Suicidal thoughts   0    PHQ-9 Score   8    Difficult doing work/chores   Somewhat difficult          11/07/2022    9:53 AM  Fall Risk   Falls in the past year? 0  Number falls in past yr: 0  Injury with Fall? 0  Risk for fall due to : Impaired balance/gait;Impaired vision  Follow up Falls evaluation completed;Falls prevention discussed    Patient Care Team: Blane Ohara, MD as PCP - General (Family Medicine) Croitoru, Rachelle Hora, MD as PCP - Cardiology (Cardiology) Lucie Leather Alvira Philips, MD as Consulting Physician (Allergy and Immunology) Lynann Bologna, MD as Consulting Physician (Gastroenterology) Zettie Pho, [facility] (Inactive) (Pharmacist)   Review of Systems  Constitutional:  Negative for chills, fatigue and fever.  HENT:  Negative for congestion, ear pain, rhinorrhea and sore throat.   Respiratory:  Negative for  cough and shortness of breath.   Cardiovascular:  Negative for chest pain.  Gastrointestinal:  Negative for abdominal pain, constipation, diarrhea, nausea and vomiting.  Genitourinary:  Negative for dysuria and urgency.  Musculoskeletal:  Negative for back pain and myalgias.  Neurological:  Negative for dizziness, weakness, light-headedness and headaches.  Psychiatric/Behavioral:  Negative for dysphoric mood. The patient is not nervous/anxious.     Current Outpatient Medications on File Prior to Visit  Medication Sig Dispense Refill   albuterol (VENTOLIN HFA) 108 (90 Base) MCG/ACT inhaler Inhale 2 puffs into the lungs.     aspirin 81 MG tablet Take 81 mg by mouth daily.     Budeson-Glycopyrrol-Formoterol (BREZTRI AEROSPHERE) 160-9-4.8 MCG/ACT AERO Inhale 2 puffs into the lungs 2 (two) times daily as needed (for flares). 10.7 g 11   Cholecalciferol (VITAMIN D3 SUPER STRENGTH PO) Take 1,000 Units by mouth daily after lunch.     dorzolamide-timolol (COSOPT) 22.3-6.8 MG/ML ophthalmic solution Place 1 drop into the left eye 2 (two) times daily.      latanoprost (XALATAN) 0.005 % ophthalmic solution Place 1 drop into the left eye  at bedtime.     levothyroxine (SYNTHROID) 88 MCG tablet Take 1 tablet (88 mcg total) by mouth daily. 90 tablet 3   olmesartan (BENICAR) 20 MG tablet TAKE 1 TABLET BY MOUTH EVERY DAY 90 tablet 2   No current facility-administered medications on file prior to visit.   Past Medical History:  Diagnosis Date   Anxiety    Arthritis    Chest pain 2011   CARDIOLITE - no significant symptoms, EKG changes, or arrhythmias   Congenital prolapse of bladder mucosa    Diverticulosis    Dyspnea 02/16/2012   2D ECHO - EF 55-60%, normal   Esophageal dysphagia    Fatigue    GERD (gastroesophageal reflux disease)    Glaucoma    Headache(784.0)    Heart attack (HCC)    Hiatal hernia    High blood pressure 02/16/2012   RENAL DOPPLER - normal   Hyperlipidemia    Hypothyroidism     Labile hypertension    Lightheadedness    Migraine headache    Multiple allergies    Myalgia    NSTEMI (non-ST elevated myocardial infarction) (HCC) 07/14/2020   OSA (obstructive sleep apnea)    Pain, lower leg    Calf   Problem of menstruation    Proteinuria    Reflux    SOB (shortness of breath)    Thyroid disease    Urinary problem    Valvular heart disease    Vitamin B12 deficiency    Vitamin D deficiency    Past Surgical History:  Procedure Laterality Date   ABDOMINAL HYSTERECTOMY  1976   CHOLECYSTECTOMY  2000   COLONOSCOPY  07/27/2014   Moderate predominantly sigmoid divertuculosis. Otherwise normal colonoscopy   CORONARY ARTERY BYPASS GRAFT N/A 07/10/2018   Procedure: CORONARY ARTERY BYPASS GRAFTING (CABG), FREE LIMA;  Surgeon: [name], MD;  Location: [facility] OR;  Service: Open Heart Surgery;  Laterality: N/A;   CYST REMOVAL NECK     CYSTECTOMY  1974   Intestine   CYSTECTOMY  1996   Brain stem   ESOPHAGOGASTRODUODENOSCOPY  12/07/2016   Schatzki's ring status post esophageal dilatation. Small hiatal hernia. Mild gastritis   EYE SURGERY  2003   EYE SURGERY  07/2016   LEFT HEART CATH AND CORONARY ANGIOGRAPHY N/A 07/08/2018   Procedure: LEFT HEART CATH AND CORONARY ANGIOGRAPHY;  Surgeon: Runell Gess, MD;  Location: [facility] INVASIVE [facility];  Service: Cardiovascular;  Laterality: N/A;   LEFT HEART CATH AND CORONARY ANGIOGRAPHY N/A 07/25/2019   Procedure: LEFT HEART CATH AND CORONARY ANGIOGRAPHY;  Surgeon: Kathleene Hazel, MD;  Location: [facility];  Service: Cardiovascular;  Laterality: N/A;   MOUTH SURGERY  2013   RADIOLOGY WITH ANESTHESIA N/A 11/05/2019   Procedure: MRI WITH ANESTHESIA;  Surgeon: Radiologist, Medication, MD;  Location: [facility] OR;  Service: Radiology;  Laterality: N/A;   TEE WITHOUT CARDIOVERSION N/A 07/10/2018   Procedure: TRANSESOPHAGEAL ECHOCARDIOGRAM (TEE);  Surgeon: [name], MD;  Location: [facility] OR;  Service: Open Heart Surgery;   Laterality: N/A;   TONSILLECTOMY     age 55    Family History  Problem Relation Age of Onset   Other Mother        blood disorder - unsure of name   Heart attack Father    Stroke Father    Cancer Sister    Cancer Brother    Asthma Brother    Cancer Brother    Social History   Socioeconomic History  Marital status: Married    Spouse name: Not on file   Number of children: 3   Years of education: college   Highest education level: Not on file  Occupational History   Occupation: Retired  Tobacco Use   Smoking status: Never   Smokeless tobacco: Never  Vaping Use   Vaping status: Never Used  Substance and Sexual Activity   Alcohol use: No   Drug use: No   Sexual activity: Not on file  Other Topics Concern   Not on file  Social History Narrative   Lives with husband in [place], [place].   Right-handed.   No daily caffeine use.   Social Drivers of Health   Financial Resource Strain: Medium Risk (10/19/2021)   Overall Financial Resource Strain (CARDIA)    Difficulty of Paying Living Expenses: Somewhat hard  Food Insecurity: No Food Insecurity (10/19/2021)   Hunger Vital Sign    Worried About Running Out of Food in the Last Year: Never true    Ran Out of Food in the Last Year: Never true  Transportation Needs: No Transportation Needs (05/09/2022)   PRAPARE - Administrator, Civil Service (Medical): No    Lack of Transportation (Non-Medical): No  Physical Activity: Inactive (05/09/2022)   Exercise Vital Sign    Days of Exercise per Week: 0 days    Minutes of Exercise per Session: 0 min  Stress: No Stress Concern Present (05/11/2021)   [employer] of Occupational Health - Occupational Stress Questionnaire    Feeling of Stress : Only a little  Social Connections: Moderately Integrated (04/19/2022)   Social Connection and Isolation Panel [NHANES]    Frequency of Communication with Friends and Family: Once a week    Frequency of Social Gatherings with Friends  and Family: Twice a week    Attends Religious Services: More than 4 times per year    Active Member of [employer] or Organizations: No    Attends Banker Meetings: Never    Marital Status: Married    Objective:  There were no vitals taken for this visit.     01/25/2023    8:59 AM 12/25/2022    2:52 PM 12/25/2022    2:46 PM  BP/Weight  Systolic BP 122 145 200  Diastolic BP 58 81 78  Wt. (Lbs) 105  108  BMI 18.02 kg/m2  18.54 kg/m2    Physical Exam Vitals reviewed.  Constitutional:      Appearance: Normal appearance. She is normal weight.  Neck:     Vascular: No carotid bruit.  Cardiovascular:     Rate and Rhythm: Normal rate and regular rhythm.     Heart sounds: Normal heart sounds.  Pulmonary:     Effort: Pulmonary effort is normal. No respiratory distress.     Breath sounds: Normal breath sounds.  Abdominal:     General: Abdomen is flat. Bowel sounds are normal.     Palpations: Abdomen is soft.     Tenderness: There is no abdominal tenderness.  Neurological:     Mental Status: She is alert and oriented to person, place, and time.  Psychiatric:        Mood and Affect: Mood normal.        Behavior: Behavior normal.     Diabetic Foot Exam - Simple   No data filed      Lab Results  Component Value Date   WBC 7.1 11/07/2022   HGB 12.8 11/07/2022   HCT 38.6  11/07/2022   PLT 232 11/07/2022   GLUCOSE 98 11/07/2022   CHOL 149 11/07/2022   TRIG 84 11/07/2022   HDL 67 11/07/2022   LDLCALC 66 11/07/2022   ALT 24 11/07/2022   AST 20 11/07/2022   NA 140 11/07/2022   K 4.6 11/07/2022   CL 104 11/07/2022   CREATININE 0.79 11/07/2022   BUN 16 11/07/2022   CO2 23 11/07/2022   TSH 0.902 11/07/2022   INR 0.9 07/14/2020   HGBA1C 5.9 (H) 12/08/2021      Assessment & Plan:    Acquired hypothyroidism  Hypertensive heart disease with heart failure (HCC)  Gastroesophageal reflux disease without esophagitis  Mixed hyperlipidemia     No orders of the  defined types were placed in this encounter.   No orders of the defined types were placed in this encounter.    Follow-up: No follow-ups on file.   I,Deatra Mcmahen A Bintou Lafata,acting as a scribe for Blane Ohara, MD.,have documented all relevant documentation on the behalf of Blane Ohara, MD,as directed by  Blane Ohara, MD while in the presence of Blane Ohara, MD.   An After Visit Summary was printed and given to the patient.  Blane Ohara, MD [name] [phone]

## 2023-02-08 ENCOUNTER — Encounter: Payer: Medicare Other | Admitting: Family Medicine

## 2023-02-08 DIAGNOSIS — E782 Mixed hyperlipidemia: Secondary | ICD-10-CM

## 2023-02-08 DIAGNOSIS — E039 Hypothyroidism, unspecified: Secondary | ICD-10-CM

## 2023-02-08 DIAGNOSIS — I110 Hypertensive heart disease with heart failure: Secondary | ICD-10-CM

## 2023-02-08 DIAGNOSIS — K219 Gastro-esophageal reflux disease without esophagitis: Secondary | ICD-10-CM

## 2023-02-10 NOTE — Progress Notes (Signed)
Cancelled. Dr. [name]  

## 2023-02-12 ENCOUNTER — Ambulatory Visit: Payer: Medicare Other

## 2023-02-12 VITALS — BP 130/70 | HR 69 | Temp 98.0°F | Resp 16 | Ht 64.0 in | Wt 105.0 lb

## 2023-02-12 DIAGNOSIS — K2100 Gastro-esophageal reflux disease with esophagitis, without bleeding: Secondary | ICD-10-CM | POA: Diagnosis not present

## 2023-02-12 DIAGNOSIS — H9202 Otalgia, left ear: Secondary | ICD-10-CM | POA: Diagnosis not present

## 2023-02-12 DIAGNOSIS — R0989 Other specified symptoms and signs involving the circulatory and respiratory systems: Secondary | ICD-10-CM

## 2023-02-12 DIAGNOSIS — R5383 Other fatigue: Secondary | ICD-10-CM

## 2023-02-12 MED ORDER — PANTOPRAZOLE SODIUM 20 MG PO TBEC: 20.0000 mg | DELAYED_RELEASE_TABLET | Freq: Two times a day (BID) | ORAL | 0 refills | Status: DC

## 2023-02-12 NOTE — Progress Notes (Unsigned)
Acute Office Visit  Subjective:    Patient ID: Leah Pittman, female    DOB: 1930-02-24, 89 y.o.   MRN: [id number]  Chief Complaint  Patient presents with   Sinusitis   Ear Pain   Headache   Shortness of Breath    Discussed the use of AI scribe software for clinical note transcription with the patient, who gave verbal consent to proceed.      HPI: The patient, with a history of high blood pressure, acid reflux, and esophageal dysmotility, presents with a chief complaint of throat discomfort and voice changes. She describes her voice as sounding like a 'primal talking' and notes that this has been a persistent issue since a recent earache. The earache, which was severe and lasted for two days, was associated with sharp pain and dryness in the left ear. The pain has since resolved, but the patient reports transient pain in the left temple and ear.  The patient completed a 10-day course of antibiotics, which she initially struggled to swallow due to the size of the tablets. She eventually switched to a liquid form of the medication. She also reports a long-standing issue with her throat, which would intermittently cause her voice to become a whisper, similar to laryngitis. However, this issue has become persistent and is now present every day and night.  The patient also reports chest pain, which she describes as a dull ache rather than sharp pains. She has previously undergone quadrilateral surgery and has consulted with a cardiologist about the chest pain. The patient has also been referred to a gastroenterologist for further investigation.  The patient reports significant mucus production and sinus congestion, which lasted for over a week. This was associated with pain under the eyes and a general feeling of malaise. The patient also reports a loss of appetite and significant weight loss over the past year.  The patient has a history of high blood pressure, which has previously  required hospitalization when it reached over 200. She is currently managing her blood pressure with medication, taking half a pill if her blood pressure is over 110, and a full pill if it is over 170. However, she reports that her blood pressure has been consistently low recently, often under 100.  The patient also reports difficulty swallowing, which has been investigated with an upper GI series. This revealed a small sliding hiatal hernia, spontaneous gastroesophageal reflux disease, and esophageal dysmotility. The patient has been prescribed medication for the acid reflux.  Past Medical History:  Diagnosis Date   Anxiety    Arthritis    Chest pain 2011   CARDIOLITE - no significant symptoms, EKG changes, or arrhythmias   Congenital prolapse of bladder mucosa    Diverticulosis    Dyspnea 02/16/2012   2D ECHO - EF 55-60%, normal   Esophageal dysphagia    Fatigue    GERD (gastroesophageal reflux disease)    Glaucoma    Headache(784.0)    Heart attack (HCC)    Hiatal hernia    High blood pressure 02/16/2012   RENAL DOPPLER - normal   Hyperlipidemia    Hypothyroidism    Labile hypertension    Lightheadedness    Migraine headache    Multiple allergies    Myalgia    NSTEMI (non-ST elevated myocardial infarction) (HCC) 07/14/2020   OSA (obstructive sleep apnea)    Pain, lower leg    Calf   Problem of menstruation    Proteinuria    Reflux  SOB (shortness of breath)    Thyroid disease    Urinary problem    Valvular heart disease    Vitamin B12 deficiency    Vitamin D deficiency     Past Surgical History:  Procedure Laterality Date   ABDOMINAL HYSTERECTOMY  1976   CHOLECYSTECTOMY  2000   COLONOSCOPY  07/27/2014   Moderate predominantly sigmoid divertuculosis. Otherwise normal colonoscopy   CORONARY ARTERY BYPASS GRAFT N/A 07/10/2018   Procedure: CORONARY ARTERY BYPASS GRAFTING (CABG), FREE LIMA;  Surgeon: [name], MD;  Location: [facility] OR;  Service: Open Heart  Surgery;  Laterality: N/A;   CYST REMOVAL NECK     CYSTECTOMY  1974   Intestine   CYSTECTOMY  1996   Brain stem   ESOPHAGOGASTRODUODENOSCOPY  12/07/2016   Schatzki's ring status post esophageal dilatation. Small hiatal hernia. Mild gastritis   EYE SURGERY  2003   EYE SURGERY  07/2016   LEFT HEART CATH AND CORONARY ANGIOGRAPHY N/A 07/08/2018   Procedure: LEFT HEART CATH AND CORONARY ANGIOGRAPHY;  Surgeon: Runell Gess, MD;  Location: [facility];  Service: Cardiovascular;  Laterality: N/A;   LEFT HEART CATH AND CORONARY ANGIOGRAPHY N/A 07/25/2019   Procedure: LEFT HEART CATH AND CORONARY ANGIOGRAPHY;  Surgeon: Kathleene Hazel, MD;  Location: [facility];  Service: Cardiovascular;  Laterality: N/A;   MOUTH SURGERY  2013   RADIOLOGY WITH ANESTHESIA N/A 11/05/2019   Procedure: MRI WITH ANESTHESIA;  Surgeon: Radiologist, Medication, MD;  Location: [facility] OR;  Service: Radiology;  Laterality: N/A;   TEE WITHOUT CARDIOVERSION N/A 07/10/2018   Procedure: TRANSESOPHAGEAL ECHOCARDIOGRAM (TEE);  Surgeon: [name], MD;  Location: [facility] OR;  Service: Open Heart Surgery;  Laterality: N/A;   TONSILLECTOMY     age 26    Family History  Problem Relation Age of Onset   Other Mother        blood disorder - unsure of name   Heart attack Father    Stroke Father    Cancer Sister    Cancer Brother    Asthma Brother    Cancer Brother     Social History   Socioeconomic History   Marital status: Married    Spouse name: Not on file   Number of children: 3   Years of education: college   Highest education level: Not on file  Occupational History   Occupation: Retired  Tobacco Use   Smoking status: Never   Smokeless tobacco: Never  Vaping Use   Vaping status: Never Used  Substance and Sexual Activity   Alcohol use: No   Drug use: No   Sexual activity: Not on file  Other Topics Concern   Not on file  Social History Narrative   Lives with husband in [place], [place].    Right-handed.   No daily caffeine use.   Social Drivers of Health   Financial Resource Strain: Medium Risk (10/19/2021)   Overall Financial Resource Strain (CARDIA)    Difficulty of Paying Living Expenses: Somewhat hard  Food Insecurity: No Food Insecurity (10/19/2021)   Hunger Vital Sign    Worried About Running Out of Food in the Last Year: Never true    Ran Out of Food in the Last Year: Never true  Transportation Needs: No Transportation Needs (05/09/2022)   PRAPARE - Administrator, Civil Service (Medical): No    Lack of Transportation (Non-Medical): No  Physical Activity: Inactive (05/09/2022)   Exercise Vital Sign  Days of Exercise per Week: 0 days    Minutes of Exercise per Session: 0 min  Stress: No Stress Concern Present (05/11/2021)   [employer] of Occupational Health - Occupational Stress Questionnaire    Feeling of Stress : Only a little  Social Connections: Moderately Integrated (04/19/2022)   Social Connection and Isolation Panel [NHANES]    Frequency of Communication with Friends and Family: Once a week    Frequency of Social Gatherings with Friends and Family: Twice a week    Attends Religious Services: More than 4 times per year    Active Member of [employer] or Organizations: No    Attends Banker Meetings: Never    Marital Status: Married  Catering manager Violence: Not At Risk (05/11/2021)   Humiliation, Afraid, Rape, and Kick questionnaire    Fear of Current or Ex-Partner: No    Emotionally Abused: No    Physically Abused: No    Sexually Abused: No    Outpatient Medications Prior to Visit  Medication Sig Dispense Refill   albuterol (VENTOLIN HFA) 108 (90 Base) MCG/ACT inhaler Inhale 2 puffs into the lungs.     aspirin 81 MG tablet Take 81 mg by mouth daily.     Budeson-Glycopyrrol-Formoterol (BREZTRI AEROSPHERE) 160-9-4.8 MCG/ACT AERO Inhale 2 puffs into the lungs 2 (two) times daily as needed (for flares). 10.7 g 11    Cholecalciferol (VITAMIN D3 SUPER STRENGTH PO) Take 1,000 Units by mouth daily after lunch.     dorzolamide-timolol (COSOPT) 22.3-6.8 MG/ML ophthalmic solution Place 1 drop into the left eye 2 (two) times daily.      lactobacillus acidophilus (BACID) TABS tablet Take 2 tablets by mouth 3 (three) times daily.     latanoprost (XALATAN) 0.005 % ophthalmic solution Place 1 drop into the left eye at bedtime.     levothyroxine (SYNTHROID) 88 MCG tablet Take 1 tablet (88 mcg total) by mouth daily. 90 tablet 3   olmesartan (BENICAR) 20 MG tablet TAKE 1 TABLET BY MOUTH EVERY DAY 90 tablet 2   No facility-administered medications prior to visit.    Allergies  Allergen Reactions   Benadryl [Diphenhydramine] Swelling and Other (See Comments)    Tongue swells and hallucinates- could not talk   Lipase Concentrate-Hp [Digestive Enzymes] Rash   Zenpep [Pancrelipase (Lip-Prot-Amyl)] Rash   Codeine Nausea And Vomiting and Hypertension   Meperidine Nausea Only, Swelling and Other (See Comments)    Tongue swells and "I feel like death"   Other Other (See Comments)    SKIN IS VERY SENSITIVE AND IT TEARS EASILY!!   Prednisone Hypertension   Promethazine Other (See Comments)    Hypotension    Propranolol Nausea And Vomiting   Shellfish Allergy Nausea And Vomiting   Tape Other (See Comments)    Tears the skin   Tazarotene Other (See Comments)    Reaction not recalled- "Tazarotene, commonly marketed as Tazorac, Avage, and Zorac, is member of the acetylenic class of retinoids."    Iodinated Contrast Media Nausea Only and Rash    Review of Systems     Objective:        02/12/2023    2:27 PM 01/25/2023    8:59 AM 12/25/2022    2:52 PM  Vitals with BMI  Height 5\' 4"  5\' 4"    Weight 105 lbs 105 lbs   BMI 18.01 18.01   Systolic 130 122 694  Diastolic 70 58 81  Pulse 69 83     Orthostatic VS for  the past 72 hrs (Last 3 readings):  Patient Position BP Location Cuff Size  02/12/23 1427 Sitting  Left Arm Normal     Physical Exam Vitals and nursing note reviewed.  Constitutional:      Comments: HEENT: Left ear canal with slight dried blood. Right ear canal normal. CHEST: Lungs clear. CARDIOVASCULAR: Heart sounds normal.  HENT:     Head: Normocephalic and atraumatic.  Eyes:     Extraocular Movements: Extraocular movements intact.  Pulmonary:     Effort: Pulmonary effort is normal.     Breath sounds: Normal breath sounds.  Abdominal:     Comments: Mild epigastric discomfort on palpation  Neurological:     Mental Status: She is alert.  Psychiatric:        Mood and Affect: Mood normal.     Health Maintenance Due  Topic Date Due   Pneumonia Vaccine 8+ Years old (1 of 2 - PCV) Never done   Zoster Vaccines- Shingrix (1 of 2) Never done   Medicare Annual Wellness (AWV)  05/12/2022   INFLUENZA VACCINE  Never done    There are no preventive care reminders to display for this patient.   Lab Results  Component Value Date   TSH 0.902 11/07/2022   Lab Results  Component Value Date   WBC 7.1 11/07/2022   HGB 12.8 11/07/2022   HCT 38.6 11/07/2022   MCV 99 (H) 11/07/2022   PLT 232 11/07/2022   Lab Results  Component Value Date   NA 140 11/07/2022   K 4.6 11/07/2022   CO2 23 11/07/2022   GLUCOSE 98 11/07/2022   BUN 16 11/07/2022   CREATININE 0.79 11/07/2022   BILITOT 0.5 11/07/2022   ALKPHOS 73 11/07/2022   AST 20 11/07/2022   ALT 24 11/07/2022   PROT 6.9 11/07/2022   ALBUMIN 4.6 11/07/2022   CALCIUM 10.2 11/07/2022   ANIONGAP 7 01/05/2021   EGFR 71 11/07/2022   GFR 60.94 01/27/2020   Lab Results  Component Value Date   CHOL 149 11/07/2022   Lab Results  Component Value Date   HDL 67 11/07/2022   Lab Results  Component Value Date   LDLCALC 66 11/07/2022   Lab Results  Component Value Date   TRIG 84 11/07/2022   Lab Results  Component Value Date   CHOLHDL 2.2 11/07/2022   Lab Results  Component Value Date   HGBA1C 5.9 (H) 12/08/2021        Assessment & Plan:  Labile hypertension Assessment & Plan: Fluctuating blood pressure, recent readings low (100-105 mmHg). Advised to continue current management but take half of olmesartan (Benicar) if BP is around 110 mmHg. Discussed risks of high and low BP, including dizziness and weakness. - Continue current blood pressure management plan - Take half of olmesartan (Benicar) if BP is around 110 mmHg   Gastroesophageal reflux disease with esophagitis without hemorrhage Assessment & Plan: Esophageal Dysmotility and GERD Reports difficulty swallowing and food getting stuck in the throat, worsened recently. Upper GI series from November 14, 2022, shows a small sliding hiatal hernia and esophageal dysmotility. Current OTC medication for acid reflux is inadequate. Discussed risks and benefits of switching to pantoprazole, including potential side effects and expected symptom improvement. - Prescribe pantoprazole 40 mg, take twice daily for 30 days - Send prescription to [facility]  States she had prior esophageal dilatations done and the GI doctor reportedly told her that she is probably not a candidate for any more. I informed her  to make an appt with GI anyway to discuss.    Other fatigue Assessment & Plan: Significant weakness and weight loss (over 20 pounds this year), poor appetite. Discussed potential causes, including chronic conditions and recent infections. Emphasized importance of follow-up care and monitoring. - Follow up with primary care physician in one month - Review visit summary with family for better understanding of the plan   Left ear pain Assessment & Plan: Severe left ear pain for two days, now resolved with residual transient pain in left temple and ear. Examination revealed slight dried-up blood in ear canal, no active infection. Discussed monitoring for recurrence and using nasal saline for residual sinus congestion. - Monitor for recurrence of  symptoms. She has already completed the course of antibiotic. - Use nasal saline for residual sinus congestion   Other orders -     Pantoprazole Sodium; Take 1 tablet (20 mg total) by mouth 2 (two) times daily.  Dispense: 60 tablet; Refill: 0     Meds ordered this encounter  Medications   pantoprazole (PROTONIX) 20 MG tablet    Sig: Take 1 tablet (20 mg total) by mouth 2 (two) times daily.    Dispense:  60 tablet    Refill:  0    No orders of the defined types were placed in this encounter.    Follow-up: Return in about 4 weeks (around 03/12/2023) for chronic disease follow up.  An After Visit Summary was printed and given to the patient. Total time spent on today's visit was 47, including both face-to-face time and nonface-to-face time personally spent on review of chart (labs and imaging), discussing labs and goals, discussing further work-up, treatment options, referrals to specialist if needed, reviewing outside records of pertinent, answering patient's questions, and coordinating care.  Windell Moment, MD [name] [phone]

## 2023-02-12 NOTE — Patient Instructions (Signed)
Only take HALF OF BP PILL Olmesartan 20 mg if the BP is around 120/80 Do not take if the BP is lower than 110/80 Sent PROTONIX 20 MG TWICE DAILY for the acid reflux. Try nasal saline sprays for your sinus pressure. Return with Dr.[name] in 1 month.,

## 2023-02-13 DIAGNOSIS — H9202 Otalgia, left ear: Secondary | ICD-10-CM | POA: Insufficient documentation

## 2023-02-13 NOTE — Assessment & Plan Note (Signed)
Significant weakness and weight loss (over 20 pounds this year), poor appetite. Discussed potential causes, including chronic conditions and recent infections. Emphasized importance of follow-up care and monitoring. - Follow up with primary care physician in one month - Review visit summary with family for better understanding of the plan

## 2023-02-13 NOTE — Assessment & Plan Note (Signed)
Severe left ear pain for two days, now resolved with residual transient pain in left temple and ear. Examination revealed slight dried-up blood in ear canal, no active infection. Discussed monitoring for recurrence and using nasal saline for residual sinus congestion. - Monitor for recurrence of symptoms. She has already completed the course of antibiotic. - Use nasal saline for residual sinus congestion

## 2023-02-13 NOTE — Assessment & Plan Note (Addendum)
Esophageal Dysmotility and GERD Reports difficulty swallowing and food getting stuck in the throat, worsened recently. Upper GI series from November 14, 2022, shows a small sliding hiatal hernia and esophageal dysmotility. Current OTC medication for acid reflux is inadequate. Discussed risks and benefits of switching to pantoprazole, including potential side effects and expected symptom improvement. - Prescribe pantoprazole 40 mg, take twice daily for 30 days - [facility]  States she had prior esophageal dilatations done and the GI doctor reportedly told her that she is probably not a candidate for any more. I informed her to make an appt with GI anyway to discuss.

## 2023-02-13 NOTE — Assessment & Plan Note (Signed)
Fluctuating blood pressure, recent readings low (100-105 mmHg). Advised to continue current management but take half of olmesartan (Benicar) if BP is around 110 mmHg. Discussed risks of high and low BP, including dizziness and weakness. - Continue current blood pressure management plan - Take half of olmesartan (Benicar) if BP is around 110 mmHg

## 2023-02-26 ENCOUNTER — Other Ambulatory Visit: Payer: Self-pay | Admitting: Pharmacist Clinician (PhC)/ Clinical Pharmacy Specialist

## 2023-02-26 DIAGNOSIS — E785 Hyperlipidemia, unspecified: Secondary | ICD-10-CM

## 2023-03-07 ENCOUNTER — Other Ambulatory Visit: Payer: Self-pay

## 2023-03-13 ENCOUNTER — Ambulatory Visit: Payer: Medicare Other | Admitting: Family Medicine

## 2023-03-13 ENCOUNTER — Encounter: Payer: Self-pay | Admitting: Family Medicine

## 2023-03-13 VITALS — BP 136/64 | HR 81 | Temp 97.2°F | Ht 64.0 in | Wt 104.0 lb

## 2023-03-13 DIAGNOSIS — T466X5A Adverse effect of antihyperlipidemic and antiarteriosclerotic drugs, initial encounter: Secondary | ICD-10-CM

## 2023-03-13 DIAGNOSIS — R1312 Dysphagia, oropharyngeal phase: Secondary | ICD-10-CM

## 2023-03-13 DIAGNOSIS — M810 Age-related osteoporosis without current pathological fracture: Secondary | ICD-10-CM | POA: Diagnosis not present

## 2023-03-13 DIAGNOSIS — E782 Mixed hyperlipidemia: Secondary | ICD-10-CM | POA: Diagnosis not present

## 2023-03-13 DIAGNOSIS — I700 Atherosclerosis of aorta: Secondary | ICD-10-CM

## 2023-03-13 DIAGNOSIS — E039 Hypothyroidism, unspecified: Secondary | ICD-10-CM | POA: Diagnosis not present

## 2023-03-13 DIAGNOSIS — M791 Myalgia, unspecified site: Secondary | ICD-10-CM

## 2023-03-13 DIAGNOSIS — R490 Dysphonia: Secondary | ICD-10-CM

## 2023-03-13 DIAGNOSIS — I119 Hypertensive heart disease without heart failure: Secondary | ICD-10-CM

## 2023-03-13 NOTE — Progress Notes (Unsigned)
Subjective:  Patient ID: Leah Pittman, female    DOB: Nov 04, 1930  Age: 89 y.o. MRN: [id number]  Chief Complaint  Patient presents with   Hypertension    HPI The patient, with a history of high blood pressure, acid reflux, and esophageal dysmotility acid reflux medication has not helped much, state she tried taking an old medication pantoprazole which has helped. Complains she is still hoarse. Ear pain has improved but still has some more in her left vs right.   Hypertension: Takes olmesartan 1/4 pill if sbps in 120s and 1/2 pill if sbp is great 130. This is working. Not having low or high bps.   History of Present Illness The patient, with a history of thyroid issues, high cholesterol, and high blood pressure, presents with multiple complaints. The patient reports difficulty swallowing, stating that she has to be careful with how she swallows and mostly eats soft foods. She also reports hoarseness and has been seen by ENT and GI. She had a dilitation by GI, but it did not help and Dr. Chales Abrahams is reluctant to repeat the procedure due to patient's frailty  The patient also complains of cramping in the leg and an issue with her knee. The cramping has been severe enough to cause the patient to miss physical therapy sessions. The patient reports that the leg issue has caused her to hobble and occasionally nearly fall.  The patient has stopped taking cholesterol medication due to side effects and is currently taking levothyroxine 88 mcg daily for 2 weeks, pantoprazole, 20 mg daily, and vitamin D3. The patient also mentions that she has been experiencing shortness of breath when doing strenuous activities.     11/07/2022    9:54 AM 07/20/2022    2:24 PM 06/29/2022    2:13 PM 04/19/2022    1:34 PM 06/29/2021    9:28 AM  Depression screen PHQ 2/9  Decreased Interest 0 0 0 0 0  Down, Depressed, Hopeless 0 0 0 0 0  PHQ - 2 Score 0 0 0 0 0  Altered sleeping   2    Tired, decreased energy   3     Change in appetite   3    Feeling bad or failure about yourself    0    Trouble concentrating   0    Moving slowly or fidgety/restless   0    Suicidal thoughts   0    PHQ-9 Score   8    Difficult doing work/chores   Somewhat difficult          11/07/2022    9:53 AM  Fall Risk   Falls in the past year? 0  Number falls in past yr: 0  Injury with Fall? 0  Risk for fall due to : Impaired balance/gait;Impaired vision  Follow up Falls evaluation completed;Falls prevention discussed    Patient Care Team: Blane Ohara, MD as PCP - General (Family Medicine) Croitoru, Rachelle Hora, MD as PCP - Cardiology (Cardiology) Lucie Leather Alvira Philips, MD as Consulting Physician (Allergy and Immunology) Lynann Bologna, MD as Consulting Physician (Gastroenterology) Zettie Pho, [facility] (Inactive) (Pharmacist)   Review of Systems  Constitutional:  Negative for chills, fatigue and fever.  HENT:  Positive for ear pain (left ear). Negative for congestion, rhinorrhea and sore throat.   Respiratory:  Positive for shortness of breath. Negative for cough.   Cardiovascular:  Negative for chest pain.  Gastrointestinal:  Negative for abdominal pain, constipation, diarrhea, nausea and vomiting.  Genitourinary:  Negative for dysuria and urgency.  Musculoskeletal:  Positive for myalgias and neck pain. Negative for arthralgias and back pain.  Neurological:  Negative for dizziness, weakness, light-headedness and headaches.  Psychiatric/Behavioral:  Negative for dysphoric mood. The patient is not nervous/anxious.     Current Outpatient Medications on File Prior to Visit  Medication Sig Dispense Refill   albuterol (VENTOLIN HFA) 108 (90 Base) MCG/ACT inhaler Inhale 2 puffs into the lungs.     aspirin 81 MG tablet Take 81 mg by mouth daily.     Budeson-Glycopyrrol-Formoterol (BREZTRI AEROSPHERE) 160-9-4.8 MCG/ACT AERO Inhale 2 puffs into the lungs 2 (two) times daily as needed (for flares). 10.7 g 11   Cholecalciferol (VITAMIN  D3 SUPER STRENGTH PO) Take 1,000 Units by mouth daily after lunch.     dorzolamide-timolol (COSOPT) 22.3-6.8 MG/ML ophthalmic solution Place 1 drop into the left eye 2 (two) times daily.      lactobacillus acidophilus (BACID) TABS tablet Take 2 tablets by mouth 3 (three) times daily.     latanoprost (XALATAN) 0.005 % ophthalmic solution Place 1 drop into the left eye at bedtime.     levothyroxine (SYNTHROID) 88 MCG tablet Take 1 tablet (88 mcg total) by mouth daily. 90 tablet 3   olmesartan (BENICAR) 20 MG tablet TAKE 1 TABLET BY MOUTH EVERY DAY 90 tablet 2   pantoprazole (PROTONIX) 20 MG tablet TAKE 1 TABLET BY MOUTH TWICE A DAY 180 tablet 1   No current facility-administered medications on file prior to visit.   Past Medical History:  Diagnosis Date   Anxiety    Arthritis    Chest pain 2011   CARDIOLITE - no significant symptoms, EKG changes, or arrhythmias   Congenital prolapse of bladder mucosa    Diverticulosis    Dyspnea 02/16/2012   2D ECHO - EF 55-60%, normal   Esophageal dysphagia    Fatigue    GERD (gastroesophageal reflux disease)    Glaucoma    Headache(784.0)    Heart attack (HCC)    Hiatal hernia    High blood pressure 02/16/2012   RENAL DOPPLER - normal   Hyperlipidemia    Hypothyroidism    Labile hypertension    Lightheadedness    Migraine headache    Multiple allergies    Myalgia    NSTEMI (non-ST elevated myocardial infarction) (HCC) 07/14/2020   OSA (obstructive sleep apnea)    Pain, lower leg    Calf   Problem of menstruation    Proteinuria    Reflux    SOB (shortness of breath)    Thyroid disease    Urinary problem    Valvular heart disease    Vitamin B12 deficiency    Vitamin D deficiency    Past Surgical History:  Procedure Laterality Date   ABDOMINAL HYSTERECTOMY  1976   CHOLECYSTECTOMY  2000   COLONOSCOPY  07/27/2014   Moderate predominantly sigmoid divertuculosis. Otherwise normal colonoscopy   CORONARY ARTERY BYPASS GRAFT N/A 07/10/2018    Procedure: CORONARY ARTERY BYPASS GRAFTING (CABG), FREE LIMA;  Surgeon: [name], MD;  Location: [facility] OR;  Service: Open Heart Surgery;  Laterality: N/A;   CYST REMOVAL NECK     CYSTECTOMY  1974   Intestine   CYSTECTOMY  1996   Brain stem   ESOPHAGOGASTRODUODENOSCOPY  12/07/2016   Schatzki's ring status post esophageal dilatation. Small hiatal hernia. Mild gastritis   EYE SURGERY  2003   EYE SURGERY  07/2016   LEFT HEART CATH AND  CORONARY ANGIOGRAPHY N/A 07/08/2018   Procedure: LEFT HEART CATH AND CORONARY ANGIOGRAPHY;  Surgeon: Runell Gess, MD;  Location: [facility] INVASIVE CV LAB;  Service: Cardiovascular;  Laterality: N/A;   LEFT HEART CATH AND CORONARY ANGIOGRAPHY N/A 07/25/2019   Procedure: LEFT HEART CATH AND CORONARY ANGIOGRAPHY;  Surgeon: Kathleene Hazel, MD;  Location: [facility] INVAS[facility] CV [facility];  Service: Cardiovascular;  Laterality: N/A;   MOUTH SURGERY  2013   RADIOLOGY WITH ANESTHESIA N/A 11/05/2019   Procedure: MRI WITH ANESTHESIA;  Surgeon: Radiologist, Medication, MD;  Location: [facility] OR;  Service: Radiology;  Laterality: N/A;   TEE WITHOUT CARDIOVERSION N/A 07/10/2018   Procedure: TRANSESOPHAGEAL ECHOCARDIOGRAM (TEE);  Surgeon: [name], MD;  Location: [facility] OR;  Service: Open Heart Surgery;  Laterality: N/A;   TONSILLECTOMY     age 67    Family History  Problem Relation Age of Onset   Other Mother        blood disorder - unsure of name   Heart attack Father    Stroke Father    Cancer Sister    Cancer Brother    Asthma Brother    Cancer Brother    Social History   Socioeconomic History   Marital status: Married    Spouse name: Not on file   Number of children: 3   Years of education: college   Highest education level: Not on file  Occupational History   Occupation: Retired  Tobacco Use   Smoking status: Never   Smokeless tobacco: Never  Vaping Use   Vaping status: Never Used  Substance and Sexual Activity   Alcohol use: No   Drug use: No   Sexual  activity: Not on file  Other Topics Concern   Not on file  Social History Narrative   Lives with husband in [place], [place].   Right-handed.   No daily caffeine use.   Social Drivers of Health   Financial Resource Strain: Medium Risk (10/19/2021)   Overall Financial Resource Strain (CARDIA)    Difficulty of Paying Living Expenses: Somewhat hard  Food Insecurity: No Food Insecurity (10/19/2021)   Hunger Vital Sign    Worried About Running Out of Food in the Last Year: Never true    Ran Out of Food in the Last Year: Never true  Transportation Needs: No Transportation Needs (05/09/2022)   PRAPARE - Administrator, Civil Service (Medical): No    Lack of Transportation (Non-Medical): No  Physical Activity: Inactive (05/09/2022)   Exercise Vital Sign    Days of Exercise per Week: 0 days    Minutes of Exercise per Session: 0 min  Stress: No Stress Concern Present (05/11/2021)   [employer] of Occupational Health - Occupational Stress Questionnaire    Feeling of Stress : Only a little  Social Connections: Moderately Integrated (04/19/2022)   Social Connection and Isolation Panel [NHANES]    Frequency of Communication with Friends and Family: Once a week    Frequency of Social Gatherings with Friends and Family: Twice a week    Attends Religious Services: More than 4 times per year    Active Member of [employer] or Organizations: No    Attends Engineer, structural: Never    Marital Status: Married    Objective:  BP 136/64   Pulse 81   Temp (!) 97.2 F (36.2 C)   Ht 5\' 4"  (1.626 m)   Wt 104 lb (47.2 kg)   SpO2 95%   BMI 17.85  kg/m      03/13/2023    3:28 PM 02/12/2023    2:27 PM 01/25/2023    8:59 AM  BP/Weight  Systolic BP 136 130 122  Diastolic BP 64 70 58  Wt. (Lbs) 104 105 105  BMI 17.85 kg/m2 18.02 kg/m2 18.02 kg/m2    Physical Exam Vitals reviewed.  Constitutional:      Appearance: Normal appearance.     Comments: thin  Neck:     Vascular: No  carotid bruit.  Cardiovascular:     Rate and Rhythm: Normal rate and regular rhythm.     Heart sounds: Normal heart sounds.  Pulmonary:     Effort: Pulmonary effort is normal. No respiratory distress.     Breath sounds: Normal breath sounds.  Abdominal:     General: Abdomen is flat. Bowel sounds are normal.     Palpations: Abdomen is soft.     Tenderness: There is no abdominal tenderness.  Neurological:     Mental Status: She is alert and oriented to person, place, and time.  Psychiatric:        Mood and Affect: Mood normal.        Behavior: Behavior normal.     Diabetic Foot Exam - Simple   No data filed      Lab Results  Component Value Date   WBC 7.1 11/07/2022   HGB 12.8 11/07/2022   HCT 38.6 11/07/2022   PLT 232 11/07/2022   GLUCOSE 98 11/07/2022   CHOL 149 11/07/2022   TRIG 84 11/07/2022   HDL 67 11/07/2022   LDLCALC 66 11/07/2022   ALT 24 11/07/2022   AST 20 11/07/2022   NA 140 11/07/2022   K 4.6 11/07/2022   CL 104 11/07/2022   CREATININE 0.79 11/07/2022   BUN 16 11/07/2022   CO2 23 11/07/2022   TSH 0.902 11/07/2022   INR 0.9 07/14/2020   HGBA1C 5.9 (H) 12/08/2021      Assessment & Plan:    Acquired hypothyroidism -     T4, free -     TSH  Age-related osteoporosis without current pathological fracture  Mixed hyperlipidemia -     Lipid panel  Atherosclerosis of aorta (HCC)  Hypertension with heart disease -     Comprehensive metabolic panel -     CBC with Differential/Platelet  Myalgia due to statin -     Magnesium -     Phosphorus    No orders of the defined types were placed in this encounter.   Orders Placed This Encounter  Procedures   Comprehensive metabolic panel   CBC with Differential/Platelet   Lipid panel   T4, free   TSH   Magnesium   Phosphorus     Follow-up: Return in about 3 months (around 06/11/2023) for chronic follow up.   I,Marla I Leal-Borjas,acting as a scribe for Blane Ohara, MD.,have documented all  relevant documentation on the behalf of Blane Ohara, MD,as directed by  Blane Ohara, MD while in the presence of Blane Ohara, MD.   An After Visit Summary was printed and given to the patient.  I attest that I have reviewed this visit and agree with the plan scribed by my staff.   Blane Ohara, MD [facility] [phone]

## 2023-03-13 NOTE — Patient Instructions (Addendum)
Continue pantoprazole daily.  Continue levothyoxine 88 mcg once daily.  Continue bp medicine.  Neck exercises.  Gargle warm salt water.  Tea and honey.  Mustard for leg cramps.

## 2023-03-14 ENCOUNTER — Ambulatory Visit: Payer: Medicare Other

## 2023-03-14 DIAGNOSIS — M791 Myalgia, unspecified site: Secondary | ICD-10-CM | POA: Insufficient documentation

## 2023-03-14 DIAGNOSIS — M810 Age-related osteoporosis without current pathological fracture: Secondary | ICD-10-CM | POA: Insufficient documentation

## 2023-03-14 DIAGNOSIS — R1312 Dysphagia, oropharyngeal phase: Secondary | ICD-10-CM

## 2023-03-14 DIAGNOSIS — R490 Dysphonia: Secondary | ICD-10-CM | POA: Insufficient documentation

## 2023-03-14 HISTORY — DX: Age-related osteoporosis without current pathological fracture: M81.0

## 2023-03-14 HISTORY — DX: Dysphagia, oropharyngeal phase: R13.12

## 2023-03-14 HISTORY — DX: Myalgia, unspecified site: M79.10

## 2023-03-14 NOTE — Addendum Note (Signed)
Addended by: Tawny Asal I on: 03/14/2023 08:59 PM   Modules accepted: Orders

## 2023-03-14 NOTE — Assessment & Plan Note (Signed)
Check thyroid labs. Continue Synthroid 88 mcg daily.

## 2023-03-14 NOTE — Assessment & Plan Note (Signed)
The current medical regimen is effective;  continue present plan and medications. Continue olmesartan 1/4 pill if sbps in 120s and 1/2 pill if sbp is great 130. This is working. Not having low or high bps.

## 2023-03-14 NOTE — Assessment & Plan Note (Signed)
  Gargle warm salt water.  Tea and honey.  Mustard for leg cramps.

## 2023-03-14 NOTE — Assessment & Plan Note (Signed)
Hope to start repatha.

## 2023-03-14 NOTE — Assessment & Plan Note (Signed)
Check lipid.  Cardiology has recommended repatha. Needed labs repeated first.

## 2023-03-14 NOTE — Assessment & Plan Note (Signed)
Intolerant to statins.  Check labs.  Reocmmend mustard.

## 2023-03-14 NOTE — Assessment & Plan Note (Signed)
Gargle warm salt water.  Tea and honey.  Mustard for leg cramps.

## 2023-03-14 NOTE — Assessment & Plan Note (Signed)
>>  ASSESSMENT AND PLAN FOR HYPERTENSION WITH HEART DISEASE WRITTEN ON 03/14/2023 11:52 AM BY [name], [name], MD  The current medical regimen is effective;  continue present plan and medications. Continue olmesartan  1/4 pill if sbps in 120s and 1/2 pill if sbp is great 130. This is working. Not having low or high bps.

## 2023-03-15 ENCOUNTER — Telehealth: Payer: Self-pay

## 2023-03-15 ENCOUNTER — Ambulatory Visit: Payer: Medicare Other

## 2023-03-15 DIAGNOSIS — E039 Hypothyroidism, unspecified: Secondary | ICD-10-CM

## 2023-03-15 DIAGNOSIS — E782 Mixed hyperlipidemia: Secondary | ICD-10-CM

## 2023-03-15 DIAGNOSIS — I119 Hypertensive heart disease without heart failure: Secondary | ICD-10-CM

## 2023-03-15 DIAGNOSIS — M791 Myalgia, unspecified site: Secondary | ICD-10-CM

## 2023-03-15 NOTE — Telephone Encounter (Signed)
RH ODER MBS-DUE TO PT DX SPEECH THERAPIST WOULD LIKE TO DO A MBS. PLEASE PLACE SIGNATURE ON THE ATTACHED ORDER AND FAX BACK

## 2023-03-16 LAB — CBC WITH DIFFERENTIAL/PLATELET
Basophils Absolute: 0.1 10*3/uL (ref 0.0–0.2)
Basos: 2 %
EOS (ABSOLUTE): 0.7 10*3/uL — ABNORMAL HIGH (ref 0.0–0.4)
Eos: 9 %
Hematocrit: 37.9 % (ref 34.0–46.6)
Hemoglobin: 12.8 g/dL (ref 11.1–15.9)
Immature Grans (Abs): 0 10*3/uL (ref 0.0–0.1)
Immature Granulocytes: 0 %
Lymphocytes Absolute: 1.5 10*3/uL (ref 0.7–3.1)
Lymphs: 19 %
MCH: 31.5 pg (ref 26.6–33.0)
MCHC: 33.8 g/dL (ref 31.5–35.7)
MCV: 93 fL (ref 79–97)
Monocytes Absolute: 1.1 10*3/uL — ABNORMAL HIGH (ref 0.1–0.9)
Monocytes: 14 %
Neutrophils Absolute: 4.5 10*3/uL (ref 1.4–7.0)
Neutrophils: 56 %
Platelets: 277 10*3/uL (ref 150–450)
RBC: 4.06 x10E6/uL (ref 3.77–5.28)
RDW: 13.7 % (ref 11.7–15.4)
WBC: 8 10*3/uL (ref 3.4–10.8)

## 2023-03-16 LAB — LIPID PANEL
Chol/HDL Ratio: 3 {ratio} (ref 0.0–4.4)
Cholesterol, Total: 225 mg/dL — ABNORMAL HIGH (ref 100–199)
HDL: 74 mg/dL (ref >39)
LDL Chol Calc (NIH): 139 mg/dL — ABNORMAL HIGH (ref 0–99)
Triglycerides: 71 mg/dL (ref 0–149)
VLDL Cholesterol Cal: 12 mg/dL (ref 5–40)

## 2023-03-16 LAB — MAGNESIUM: Magnesium: 2.2 mg/dL (ref 1.6–2.3)

## 2023-03-16 LAB — COMPREHENSIVE METABOLIC PANEL
ALT: 12 [IU]/L (ref 0–32)
AST: 16 [IU]/L (ref 0–40)
Albumin: 4.3 g/dL (ref 3.6–4.6)
Alkaline Phosphatase: 82 [IU]/L (ref 44–121)
BUN/Creatinine Ratio: 25 (ref 12–28)
BUN: 19 mg/dL (ref 10–36)
Bilirubin Total: 0.4 mg/dL (ref 0.0–1.2)
CO2: 23 mmol/L (ref 20–29)
Calcium: 10.5 mg/dL — ABNORMAL HIGH (ref 8.7–10.3)
Chloride: 102 mmol/L (ref 96–106)
Creatinine, Ser: 0.77 mg/dL (ref 0.57–1.00)
Globulin, Total: 2.3 g/dL (ref 1.5–4.5)
Glucose: 91 mg/dL (ref 70–99)
Potassium: 4.8 mmol/L (ref 3.5–5.2)
Sodium: 140 mmol/L (ref 134–144)
Total Protein: 6.6 g/dL (ref 6.0–8.5)
eGFR: 73 mL/min/{1.73_m2} (ref >59)

## 2023-03-16 LAB — T4, FREE: Free T4: 1.6 ng/dL (ref 0.82–1.77)

## 2023-03-16 LAB — TSH: TSH: 0.852 u[IU]/mL (ref 0.450–4.500)

## 2023-03-16 LAB — PHOSPHORUS: Phosphorus: 3 mg/dL (ref 3.0–4.3)

## 2023-03-18 ENCOUNTER — Other Ambulatory Visit: Payer: Self-pay | Admitting: Family Medicine

## 2023-03-18 MED ORDER — REPATHA SURECLICK 140 MG/ML ~~LOC~~ SOAJ: 140.0000 mg | SUBCUTANEOUS | 1 refills | Status: DC

## 2023-03-19 ENCOUNTER — Other Ambulatory Visit: Payer: Self-pay | Admitting: Family Medicine

## 2023-03-20 ENCOUNTER — Ambulatory Visit: Payer: Medicare Other

## 2023-03-20 ENCOUNTER — Other Ambulatory Visit: Payer: Self-pay | Admitting: Family Medicine

## 2023-03-20 NOTE — Progress Notes (Signed)
Patient is in office today for a nurse visit for  sample of Repatha . Patient Injection was given in the  Left quadriceps. Patient tolerated injection well. Injection was given at 1355, patient waited 15 minutes before leaving without any complications.

## 2023-04-27 ENCOUNTER — Encounter (HOSPITAL_BASED_OUTPATIENT_CLINIC_OR_DEPARTMENT_OTHER): Payer: Self-pay | Admitting: Emergency Medicine

## 2023-04-27 ENCOUNTER — Ambulatory Visit (HOSPITAL_BASED_OUTPATIENT_CLINIC_OR_DEPARTMENT_OTHER)
Admit: 2023-04-27 | Discharge: 2023-04-27 | Disposition: A | Discharge status: Home / Self Care | Attending: Family Medicine | Admitting: Family Medicine

## 2023-04-27 ENCOUNTER — Ambulatory Visit (HOSPITAL_BASED_OUTPATIENT_CLINIC_OR_DEPARTMENT_OTHER)
Admission: EM | Admit: 2023-04-27 | Discharge: 2023-04-27 | Disposition: A | Discharge status: Home / Self Care | Attending: Family Medicine | Admitting: Family Medicine

## 2023-04-27 ENCOUNTER — Other Ambulatory Visit (HOSPITAL_BASED_OUTPATIENT_CLINIC_OR_DEPARTMENT_OTHER): Payer: Self-pay

## 2023-04-27 ENCOUNTER — Other Ambulatory Visit: Payer: Self-pay

## 2023-04-27 DIAGNOSIS — J208 Acute bronchitis due to other specified organisms: Secondary | ICD-10-CM | POA: Diagnosis not present

## 2023-04-27 DIAGNOSIS — R051 Acute cough: Secondary | ICD-10-CM | POA: Diagnosis not present

## 2023-04-27 DIAGNOSIS — I10 Essential (primary) hypertension: Secondary | ICD-10-CM

## 2023-04-27 DIAGNOSIS — R0989 Other specified symptoms and signs involving the circulatory and respiratory systems: Secondary | ICD-10-CM | POA: Diagnosis not present

## 2023-04-27 DIAGNOSIS — J45909 Unspecified asthma, uncomplicated: Secondary | ICD-10-CM

## 2023-04-27 DIAGNOSIS — R5383 Other fatigue: Secondary | ICD-10-CM

## 2023-04-27 DIAGNOSIS — R059 Cough, unspecified: Secondary | ICD-10-CM

## 2023-04-27 LAB — POC COVID19/FLU A&B COMBO
Covid Antigen, POC: NEGATIVE
Influenza A Antigen, POC: NEGATIVE
Influenza B Antigen, POC: NEGATIVE

## 2023-04-27 MED ORDER — IPRATROPIUM-ALBUTEROL 0.5-2.5 (3) MG/3ML IN SOLN
3.0000 mL | Freq: Once | RESPIRATORY_TRACT | Status: AC
  Administered 2023-04-27: 3 mL via RESPIRATORY_TRACT

## 2023-04-27 MED ORDER — IPRATROPIUM-ALBUTEROL 0.5-2.5 (3) MG/3ML IN SOLN
3.0000 mL | Freq: Four times a day (QID) | RESPIRATORY_TRACT | 0 refills | Status: DC | PRN
  Filled 2023-04-27: qty 360, 30d supply, fill #0

## 2023-04-27 NOTE — Progress Notes (Signed)
 Chest X-Ray IMPRESSION:  1. Evidence of prior median sternotomy/CABG.   2. No acute or active cardiopulmonary disease.  Patient updated.

## 2023-04-27 NOTE — Discharge Instructions (Addendum)
 Her blood pressure is up.  Spoke to her about possibly using low-dose clonidine to lower her blood pressure.  She declined.  She reports that her family doctor (Dr. Mcneil Sober) gave her something to lower her blood pressure during an office visit once and it almost bottomed her out to passing out.  She did take her olmesartan this morning, half a tablet.  Encouraged her to sit with her legs up close her eyes and rest.  Flu and COVID are negative.  Chest x-ray is negative for pneumonia.  Will contact the patient, if the radiology review differs from mine.    Duoneb nebulizer given during her visit.  She reports that Prednisone gives her severe hypertension.  Continue inhalers, as provided by her PCP.  She is also allergic to Promethazine, so may use Robitussin cough syrup, OTC, if needed.  Given a nebulizer for home use and will use DuoNeb vials, 1 vial every 6 hours if needed for wheezing and chest congestion.  Follow-up here or with primary care for recheck in 10 to 14 days.  Patient appears weak and I believe she needs to get a lot more fluid intake talk to her about hydration.

## 2023-04-27 NOTE — ED Triage Notes (Signed)
 Persistent cough, chest and sinus congestion for over a week with increase fatigue.

## 2023-04-27 NOTE — ED Provider Notes (Signed)
 [facility]    CSN: [id number] Arrival date & time: 04/27/23  1440      History   Chief Complaint Chief Complaint  Patient presents with   URI    HPI Leah Pittman is a 89 y.o. female.   Patient is here with her husband.  She reports that on Saturday, 04/21/2023, she developed a cough, runny nose and some sinus and chest congestion.  Symptoms have persisted all week long.  She has shortness of breath with activity or movement.  She is increasingly fatigued and weak.  She is not sure how good a job she is doing with hydration or fluid intake.  She does not have much of an appetite.  She denies fever, chills, nausea, vomiting, diarrhea, constipation.  She is lightheaded and wobbly walking in the hall here today.   URI Presenting symptoms: congestion, cough and fatigue   Presenting symptoms: no ear pain, no fever and no sore throat   Associated symptoms: no arthralgias     Past Medical History:  Diagnosis Date   Anxiety    Arthritis    Chest pain 2011   CARDIOLITE - no significant symptoms, EKG changes, or arrhythmias   Congenital prolapse of bladder mucosa    Diverticulosis    Dyspnea 02/16/2012   2D ECHO - EF 55-60%, normal   Esophageal dysphagia    Fatigue    GERD (gastroesophageal reflux disease)    Glaucoma    Headache(784.0)    Heart attack (HCC)    Hiatal hernia    High blood pressure 02/16/2012   RENAL DOPPLER - normal   Hyperlipidemia    Hypothyroidism    Labile hypertension    Lightheadedness    Migraine headache    Multiple allergies    Myalgia    NSTEMI (non-ST elevated myocardial infarction) (HCC) 07/14/2020   OSA (obstructive sleep apnea)    Pain, lower leg    Calf   Problem of menstruation    Proteinuria    Reflux    SOB (shortness of breath)    Thyroid disease    Urinary problem    Valvular heart disease    Vitamin B12 deficiency    Vitamin D deficiency     Patient Active Problem List   Diagnosis Date Noted   Myalgia due to  statin 03/14/2023   Hoarseness 03/14/2023   Oropharyngeal dysphagia 03/14/2023   Age-related osteoporosis without current pathological fracture 03/14/2023   Left ear pain 02/13/2023   Acute pharyngitis 01/25/2023   Non-recurrent acute suppurative otitis media of left ear without spontaneous rupture of tympanic membrane 01/25/2023   Asymptomatic microscopic hematuria 11/11/2022   Other fatigue 11/07/2022   Neuropathy 11/07/2022   Osteopenia 11/07/2022   Polyuria 09/24/2022   Atherosclerosis of aorta (HCC) 09/17/2022   Coronary artery disease 09/17/2022   History of atrial fibrillation 09/17/2022   Labile hypertension 07/21/2022   Abdominal discomfort 07/21/2022   Other chest pain 04/20/2022   Palpitations 04/20/2022   Prediabetes 12/15/2021   Weakness 10/15/2021   Muscle cramps 10/15/2021   Hypertension with heart disease 07/18/2021   Vitamin D insufficiency 07/18/2021   Cephalalgia 04/28/2021   Acquired hypothyroidism 04/28/2021   DOE (dyspnea on exertion) 04/25/2021   Mixed hyperlipidemia 03/25/2021   Myalgia 03/25/2021   Hypertensive heart disease with heart failure (HCC) 01/23/2021   Paresthesia 10/18/2020   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   Unstable gait 12/22/2019   Paresthesias 12/22/2019   Pancreatic duct dilated    Descending  thoracic aortic dissection (HCC)    Shortness of breath 08/20/2019   Constipation 08/20/2019   Chronic obstructive pulmonary disease with acute lower respiratory infection (HCC) 08/20/2019   Epigastric abdominal pain 08/20/2019   Neck pain 07/24/2019   S/P CABG x 4 07/10/2018   Coronary artery disease involving native coronary artery of native heart with unstable angina pectoris (HCC) 07/08/2018   Hypertensive arteriosclerotic cardiovascular disease 08/14/2017   Nuclear sclerotic cataract, left 06/23/2016   Precordial chest pain 01/13/2015   Bilateral occipital neuralgia 12/16/2014   Osteoarthritis of cervical spine 12/16/2014   GERD  (gastroesophageal reflux disease) 04/01/2013   Epiphora 02/03/2013   History of Clostridium difficile infection 02/03/2013   History of dacryocystitis 02/03/2013   Stenosis of nasolacrimal duct, acquired 02/03/2013   Blindness, legal 01/30/2013   Pigmentary glaucoma of both eyes, 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/30/2013   Glaucoma 09/29/2012   Migraine headache     Past Surgical History:  Procedure Laterality Date   ABDOMINAL HYSTERECTOMY  1976   CHOLECYSTECTOMY  2000   COLONOSCOPY  07/27/2014   Moderate predominantly sigmoid divertuculosis. Otherwise normal colonoscopy   CORONARY ARTERY BYPASS GRAFT N/A 07/10/2018   Procedure: CORONARY ARTERY BYPASS GRAFTING (CABG), FREE LIMA;  Surgeon: [name], MD;  Location: [facility] OR;  Service: Open Heart Surgery;  Laterality: N/A;   CYST REMOVAL NECK     CYSTECTOMY  1974   Intestine   CYSTECTOMY  1996   Brain stem   ESOPHAGOGASTRODUODENOSCOPY  12/07/2016   Schatzki's ring status post esophageal dilatation. Small hiatal hernia. Mild gastritis   EYE SURGERY  2003   EYE SURGERY  07/2016   LEFT HEART CATH AND CORONARY ANGIOGRAPHY N/A 07/08/2018   Procedure: LEFT HEART CATH AND CORONARY ANGIOGRAPHY;  Surgeon: Runell Gess, MD;  Location: [facility];  Service: Cardiovascular;  Laterality: N/A;   LEFT HEART CATH AND CORONARY ANGIOGRAPHY N/A 07/25/2019   Procedure: LEFT HEART CATH AND CORONARY ANGIOGRAPHY;  Surgeon: Kathleene Hazel, MD;  Location: [facility];  Service: Cardiovascular;  Laterality: N/A;   MOUTH SURGERY  2013   RADIOLOGY WITH ANESTHESIA N/A 11/05/2019   Procedure: MRI WITH ANESTHESIA;  Surgeon: Radiologist, Medication, MD;  Location: [facility] OR;  Service: Radiology;  Laterality: N/A;   TEE WITHOUT CARDIOVERSION N/A 07/10/2018   Procedure: TRANSESOPHAGEAL ECHOCARDIOGRAM (TEE);  Surgeon: [name], MD;  Location: [facility] OR;  Service: Open Heart Surgery;  Laterality: N/A;   TONSILLECTOMY     age 46    OB History   No obstetric  history on file.      Home Medications    Prior to Admission medications   Medication Sig Start Date End Date Taking? Authorizing Provider  ipratropium-albuterol (DUONEB) 0.5-2.5 (3) MG/3ML SOLN Take 3 mLs by nebulization every 6 (six) hours as needed. 04/27/23  Yes Prescilla Sours, FNP  olmesartan (BENICAR) 20 MG tablet TAKE 1 TABLET BY MOUTH EVERY DAY 09/18/22  Yes Croitoru, Mihai, MD  albuterol (VENTOLIN HFA) 108 (90 Base) MCG/ACT inhaler Inhale 2 puffs into the lungs.    [provider]  Alirocumab (PRALUENT) 150 MG/ML SOAJ Inject 1 mL (150 mg total) into the skin every 14 (fourteen) days. 03/21/23   Blane Ohara, MD  aspirin 81 MG tablet Take 81 mg by mouth daily.    [provider]  Budeson-Glycopyrrol-Formoterol (BREZTRI AEROSPHERE) 160-9-4.8 MCG/ACT AERO Inhale 2 puffs into the lungs 2 (two) times daily as needed (for flares). 10/07/21   Blane Ohara, MD  Cholecalciferol (VITAMIN D3 SUPER STRENGTH PO) Take 1,000 Units by mouth daily after lunch.    [provider]  dorzolamide-timolol (COSOPT) 22.3-6.8 MG/ML ophthalmic solution Place 1 drop into the left eye 2 (two) times daily.  06/05/12   [provider]  lactobacillus acidophilus (BACID) TABS tablet Take 2 tablets by mouth 3 (three) times daily.    [provider]  latanoprost (XALATAN) 0.005 % ophthalmic solution Place 1 drop into the left eye at bedtime. 10/12/21   [provider]  levothyroxine (SYNTHROID) 88 MCG tablet Take 1 tablet (88 mcg total) by mouth daily. 08/16/22   [name], Fritzi Mandes, MD  pantoprazole (PROTONIX) 20 MG tablet TAKE 1 TABLET BY MOUTH TWICE A DAY 03/07/23   [name], Fritzi Mandes, MD    Family History Family History  Problem Relation Age of Onset   Other Mother        blood disorder - unsure of name   Heart attack Father    Stroke Father    Cancer Sister    Cancer Brother    Asthma Brother    Cancer Brother     Social History Social History   Tobacco Use   Smoking  status: Never   Smokeless tobacco: Never  Vaping Use   Vaping status: Never Used  Substance Use Topics   Alcohol use: No   Drug use: No     Allergies   Benadryl [diphenhydramine], Lipase concentrate-hp [digestive enzymes], Zenpep [pancrelipase (lip-prot-amyl)], Codeine, Meperidine, Other, Prednisone, Promethazine, Propranolol, Shellfish allergy, Tape, Tazarotene, and Iodinated contrast media   Review of Systems Review of Systems  Constitutional:  Positive for fatigue. Negative for chills and fever.  HENT:  Positive for congestion and sinus pressure. Negative for ear pain and sore throat.   Eyes:  Negative for pain and visual disturbance.  Respiratory:  Positive for cough and chest tightness. Negative for shortness of breath.   Cardiovascular:  Negative for chest pain and palpitations.  Gastrointestinal:  Negative for abdominal pain, constipation, diarrhea, nausea and vomiting.  Genitourinary:  Negative for dysuria and hematuria.  Musculoskeletal:  Negative for arthralgias and back pain.  Skin:  Negative for color change and rash.  Neurological:  Positive for weakness and light-headedness. Negative for seizures and syncope.  All other systems reviewed and are negative.    Physical Exam Triage Vital Signs ED Triage Vitals  Encounter Vitals Group     BP 04/27/23 1451 (!) 195/75     Systolic BP Percentile --      Diastolic BP Percentile --      Pulse Rate 04/27/23 1451 86     Resp 04/27/23 1451 (!) 25     Temp 04/27/23 1451 98 F (36.7 C)     Temp Source 04/27/23 1451 Oral     SpO2 04/27/23 1451 98 %     Weight --      Height --      Head Circumference --      Peak Flow --      Pain Score 04/27/23 1452 0     Pain Loc --      Pain Education --      Exclude from Growth Chart --    No data found.  Updated Vital Signs BP (!) 190/80   Pulse 86   Temp 98 F (36.7 C) (Oral)   Resp (!) 25   SpO2 98%   Visual Acuity Right Eye Distance:   Left Eye Distance:    Bilateral Distance:  Right Eye Near:   Left Eye Near:    Bilateral Near:     Physical Exam Vitals and nursing note reviewed.  Constitutional:      General: She is not in acute distress.    Appearance: She is well-developed, well-groomed and underweight. She is ill-appearing. She is not toxic-appearing.  HENT:     Head: Normocephalic and atraumatic.     Right Ear: Hearing, tympanic membrane, ear canal and external ear normal.     Left Ear: Hearing, tympanic membrane, ear canal and external ear normal.     Nose: Congestion and rhinorrhea present. Rhinorrhea is clear.     Right Sinus: No maxillary sinus tenderness or frontal sinus tenderness.     Left Sinus: No maxillary sinus tenderness or frontal sinus tenderness.     Mouth/Throat:     Lips: Pink.     Mouth: Mucous membranes are moist.     Dentition: Abnormal dentition (broken and missing teeth).     Pharynx: Uvula midline. No oropharyngeal exudate or posterior oropharyngeal erythema.     Tonsils: No tonsillar exudate.  Eyes:     Conjunctiva/sclera: Conjunctivae normal.     Pupils: Pupils are equal, round, and reactive to light.  Cardiovascular:     Rate and Rhythm: Normal rate and regular rhythm.     Heart sounds: S1 normal and S2 normal. No murmur heard. Pulmonary:     Effort: Pulmonary effort is normal. No respiratory distress.     Breath sounds: Examination of the right-upper field reveals wheezing. Examination of the left-upper field reveals wheezing. Examination of the right-lower field reveals decreased breath sounds. Examination of the left-lower field reveals decreased breath sounds. Decreased breath sounds and wheezing present. No rhonchi or rales.     Comments: Reassessment after DuoNeb treatment: Lungs were clear without wheezing throughout.  Oxygen saturation on room air was 98 to 100%. Abdominal:     General: Bowel sounds are normal.     Palpations: Abdomen is soft.     Tenderness: There is no abdominal  tenderness.  Musculoskeletal:        General: No swelling.     Cervical back: Neck supple.  Lymphadenopathy:     Head:     Right side of head: No submental, submandibular, tonsillar, preauricular or posterior auricular adenopathy.     Left side of head: No submental, submandibular, tonsillar, preauricular or posterior auricular adenopathy.     Cervical: No cervical adenopathy.     Right cervical: No superficial cervical adenopathy.    Left cervical: No superficial cervical adenopathy.  Skin:    General: Skin is warm and dry.     Capillary Refill: Capillary refill takes less than 2 seconds.     Findings: No rash.  Neurological:     Mental Status: She is alert and oriented to person, place, and time.     Gait: Gait abnormal (unsteady on her feet).  Psychiatric:        Mood and Affect: Mood is anxious.      UC Treatments / Results  Labs (all labs ordered are listed, but only abnormal results are displayed) Labs Reviewed  POC COVID19/FLU A&B COMBO - Normal   CBC with Differential/Platelet: 03/15/23:      Component Ref Range & Units (hover) 1 mo ago (03/15/23) 5 mo ago (11/07/22)  WBC 8.0 7.1  RBC 4.06 3.91  Hemoglobin 12.8 12.8  Hematocrit 37.9 38.6  MCV 93 99 High   MCH 31.5 32.7  MCHC  33.8 33.2  RDW 13.7 13.6  Platelets 277 232  Neutrophils 56 63  Lymphs 19 22  Monocytes 14 10  Eos 9 4  Basos 2 1  Neutrophils Absolute 4.5 4.4  Lymphocytes Absolute 1.5 1.6  Monocytes Absolute 1.1 High  0.7  EOS (ABSOLUTE) 0.7 High  0.3  Basophils Absolute 0.1 0.1  Immature Granulocytes 0 0  Immature Grans (Abs) 0.0 0.0     Comprehensive metabolic panel: 03/15/23:      Component Ref Range & Units (hover) 1 mo ago (03/15/23) 5 mo ago (11/07/22)  Glucose 91 98  BUN 19 16 R  Creatinine, Ser 0.77 0.79  eGFR 73 71  BUN/Creatinine Ratio 25 20  Sodium 140 140  Potassium 4.8 4.6  Chloride 102 104  CO2 23 23  Calcium 10.5 High  10.2  Total Protein 6.6 6.9  Albumin 4.3 4.6 R   Globulin, Total 2.3 2.3  Bilirubin Total 0.4 0.5  Alkaline Phosphatase 82 73  AST 16 20  ALT 12 24     T4, free: 03/15/23:      Component Ref Range & Units (hover) 1 mo ago (03/15/23) 5 mo ago (11/07/22)  Free T4 1.60 1.48  Resulting Agency [facility]     TSH: 03/15/23:      Component Ref Range & Units (hover) 1 mo ago (03/15/23) 5 mo ago (11/07/22)  TSH 0.852 0.902  Resulting Agency [facility]      EKG   Radiology No results found.  Procedures Procedures (including critical care time)  Medications Ordered in UC Medications  ipratropium-albuterol (DUONEB) 0.5-2.5 (3) MG/3ML nebulizer solution 3 mL (3 mLs Nebulization Given 04/27/23 1652)    Initial Impression / Assessment and Plan / UC Course  I have reviewed the triage vital signs and the nursing notes.  Pertinent labs & imaging results that were available during my care of the patient were reviewed by me and considered in my medical decision making (see chart for details).     Reviewed her labs from January 2025.  Labs are essentially normal.  She is negative for flu and COVID.  Her blood pressure was up at the beginning of the visit and recheck remained up.  She was nervous and anxious.  She reported she did not tolerate medicines that help lower her blood pressure quickly because she could bottom out and has done that at her doctor's office.  Clonidine 0.1 mg not given.  Encouraged her to try to relax and rest and she only took half of the blood pressure pill today so encouraged her to take a whole pill in the future.  I think she is mildly dehydrated encouraged good fluid intake.  Her breath sounds significantly improved after the DuoNeb treatment.  Provided a nebulizer machine and DuoNeb medication for use at home.  Chest x-ray appeared negative.  Will contact the patient if the radiology report differs.  She is allergic to promethazine and prednisone so no additional prescription medicines provided.   She could use over-the-counter Robitussin cough syrup if needed.  Follow-up here or with family doctor in 2 weeks for recheck of lungs or sooner if needed. Final Clinical Impressions(s) / UC Diagnoses   Final diagnoses:  Acute cough  Malignant hypertension  Acute viral bronchitis  Reactive airway disease without complication, unspecified asthma severity, unspecified whether persistent     Discharge Instructions      Her blood pressure is up.  Spoke to her about possibly using low-dose clonidine to  lower her blood pressure.  She declined.  She reports that her family doctor (Dr. Mcneil Sober) gave her something to lower her blood pressure during an office visit once and it almost bottomed her out to passing out.  She did take her olmesartan this morning, half a tablet.  Encouraged her to sit with her legs up close her eyes and rest.  Flu and COVID are negative.  Chest x-ray is negative for pneumonia.  Will contact the patient, if the radiology review differs from mine.    Duoneb nebulizer given during her visit.  She reports that Prednisone gives her severe hypertension.  Continue inhalers, as provided by her PCP.  She is also allergic to Promethazine, so may use Robitussin cough syrup, OTC, if needed.  Given a nebulizer for home use and will use DuoNeb vials, 1 vial every 6 hours if needed for wheezing and chest congestion.  Follow-up here or with primary care for recheck in 10 to 14 days.  Patient appears weak and I believe she needs to get a lot more fluid intake talk to her about hydration.     ED Prescriptions     Medication Sig Dispense Auth. Provider   ipratropium-albuterol (DUONEB) 0.5-2.5 (3) MG/3ML SOLN Take 3 mLs by nebulization every 6 (six) hours as needed. 360 mL Prescilla Sours, FNP      PDMP not reviewed this encounter.   Prescilla Sours, FNP 04/27/23 1740

## 2023-04-28 ENCOUNTER — Encounter (HOSPITAL_BASED_OUTPATIENT_CLINIC_OR_DEPARTMENT_OTHER): Payer: Self-pay | Admitting: Family Medicine

## 2023-04-28 ENCOUNTER — Telehealth (HOSPITAL_BASED_OUTPATIENT_CLINIC_OR_DEPARTMENT_OTHER): Payer: Self-pay | Admitting: Family Medicine

## 2023-04-28 MED ORDER — OLMESARTAN MEDOXOMIL 20 MG PO TABS: 20.0000 mg | ORAL_TABLET | Freq: Every day | ORAL | 0 refills | Status: DC

## 2023-04-28 NOTE — Telephone Encounter (Signed)
 The husband came into the UC and requested a refill.  He discovered last night, at home, that she only had 1/2 a pill left and no refills.  Her blood pressures remain elevated.    I agreed to refill the medication (Benicar/Olmesartan) and encouraged the patient to follow-up with her PCP.

## 2023-06-12 NOTE — Progress Notes (Unsigned)
 Subjective:  Patient ID: Leah Pittman, female    DOB: 03-11-1930  Age: 89 y.o. MRN: [id number]  Chief Complaint  Patient presents with   Hyperlipidemia   Hypertension   History of Present Illness   The patient, with a history of acid reflux and a recent upper respiratory infection, presents with a persistent cough and shortness of breath for two weeks. She describes the cough as severe, causing pain in the ears and temples. She also reports a sensation of chest fullness, particularly at night. Despite using a duoneb nebulizer and albuterol  inhaler, the symptoms have not improved. She has also experienced intermittent low-grade fever during this period.  The patient has a history of acid reflux, which has been managed with pantoprazole . However, she reports no improvement in symptoms with this medication. She also mentions an upper GI test in 10/2022 that revealed a small hernia in the chest and esophageal narrowing as well as recurrent reflux. She saw GI, but has never received a call from them about her results. Her cardiologist reviewed it with her at a follow up appt.   The patient also reports a persistent dry mouth and a sore throat on one side, which she has been managing with salt water gargles. She also mentions a small round knot in the middle of the chest which she would like me to check. She has a history of CABG.    Hyperlipidemia: on praluent, aspirin ,   Hypertension: benicar  20 mg daily.   Hypothyroidism: levothyroxine  88 mcg once daily in am.  GERD: Pantoprazole  20 mg one twice daily.   CORONARY ARTERY DISEASE: on praluent, aspirin .  COPD: On breztri  2 puffs twice daily and albuterol  hfa/duoneb.  PREDIABETES: last A1C in 2023.     11/07/2022    9:54 AM 07/20/2022    2:24 PM 06/29/2022    2:13 PM 04/19/2022    1:34 PM 06/29/2021    9:28 AM  Depression screen PHQ 2/9  Decreased Interest 0 0 0 0 0  Down, Depressed, Hopeless 0 0 0 0 0  PHQ - 2 Score 0 0 0 0 0   Altered sleeping   2    Tired, decreased energy   3    Change in appetite   3    Feeling bad or failure about yourself    0    Trouble concentrating   0    Moving slowly or fidgety/restless   0    Suicidal thoughts   0    PHQ-9 Score   8    Difficult doing work/chores   Somewhat difficult          11/07/2022    9:53 AM  Fall Risk   Falls in the past year? 0  Number falls in past yr: 0  Injury with Fall? 0  Risk for fall due to : Impaired balance/gait;Impaired vision  Follow up Falls evaluation completed;Falls prevention discussed    Patient Care Team: Mercy Stall, MD as PCP - General (Family Medicine) Croitoru, Karyl Paget, MD as PCP - Cardiology (Cardiology) Jerelene Monday Rema Care, MD as Consulting Physician (Allergy and Immunology) Lajuan Pila, MD as Consulting Physician (Gastroenterology) Devon Fogo, [facility] (Inactive) (Pharmacist)   Review of Systems  Constitutional:  Negative for chills, fatigue and fever.  HENT:  Negative for congestion, ear pain and sore throat.   Respiratory:  Positive for cough and chest tightness. Negative for shortness of breath.   Cardiovascular:  Negative for chest pain.  Gastrointestinal:  Negative  for abdominal pain, constipation, diarrhea, nausea and vomiting.  Genitourinary:  Negative for dysuria and urgency.  Musculoskeletal:  Negative for arthralgias and myalgias.  Skin:  Negative for rash.  Neurological:  Negative for dizziness and headaches.  Psychiatric/Behavioral:  Negative for dysphoric mood. The patient is not nervous/anxious.     Current Outpatient Medications on File Prior to Visit  Medication Sig Dispense Refill   albuterol  (VENTOLIN  HFA) 108 (90 Base) MCG/ACT inhaler Inhale 2 puffs into the lungs.     aspirin  81 MG tablet Take 81 mg by mouth daily.     Budeson-Glycopyrrol-Formoterol  (BREZTRI  AEROSPHERE) 160-9-4.8 MCG/ACT AERO Inhale 2 puffs into the lungs 2 (two) times daily as needed (for flares). 10.7 g 11   Cholecalciferol   (VITAMIN D3 SUPER STRENGTH PO) Take 1,000 Units by mouth daily after lunch.     dorzolamide -timolol  (COSOPT ) 22.3-6.8 MG/ML ophthalmic solution Place 1 drop into the left eye 2 (two) times daily.      ipratropium-albuterol  (DUONEB) 0.5-2.5 (3) MG/3ML SOLN Take 3 mLs by nebulization every 6 (six) hours as needed. 360 mL 0   lactobacillus acidophilus (BACID) TABS tablet Take 2 tablets by mouth 3 (three) times daily.     latanoprost  (XALATAN ) 0.005 % ophthalmic solution Place 1 drop into the left eye at bedtime.     levothyroxine  (SYNTHROID ) 88 MCG tablet Take 1 tablet (88 mcg total) by mouth daily. 90 tablet 3   olmesartan  (BENICAR ) 20 MG tablet Take 1 tablet (20 mg total) by mouth daily. 90 tablet 0   pantoprazole  (PROTONIX ) 20 MG tablet TAKE 1 TABLET BY MOUTH TWICE A DAY 180 tablet 1   Alirocumab (PRALUENT) 150 MG/ML SOAJ Inject 1 mL (150 mg total) into the skin every 14 (fourteen) days. (Patient not taking: Reported on 06/13/2023) 2 mL 2   No current facility-administered medications on file prior to visit.   Past Medical History:  Diagnosis Date   Anxiety    Arthritis    Chest pain 2011   CARDIOLITE  - no significant symptoms, EKG changes, or arrhythmias   Congenital prolapse of bladder mucosa    Diverticulosis    Dyspnea 02/16/2012   2D ECHO - EF 55-60%, normal   Esophageal dysphagia    Fatigue    GERD (gastroesophageal reflux disease)    Glaucoma    Headache(784.0)    Heart attack (HCC)    Hiatal hernia    High blood pressure 02/16/2012   RENAL DOPPLER - normal   Hyperlipidemia    Hypothyroidism    Labile hypertension    Lightheadedness    Migraine headache    Multiple allergies    Myalgia    NSTEMI (non-ST elevated myocardial infarction) (HCC) 07/14/2020   OSA (obstructive sleep apnea)    Pain, lower leg    Calf   Problem of menstruation    Proteinuria    Reflux    SOB (shortness of breath)    Thyroid disease    Urinary problem    Valvular heart disease    Vitamin  B12 deficiency    Vitamin D  deficiency    Past Surgical History:  Procedure Laterality Date   ABDOMINAL HYSTERECTOMY  1976   CHOLECYSTECTOMY  2000   COLONOSCOPY  07/27/2014   Moderate predominantly sigmoid divertuculosis. Otherwise normal colonoscopy   CORONARY ARTERY BYPASS GRAFT N/A 07/10/2018   Procedure: CORONARY ARTERY BYPASS GRAFTING (CABG), FREE LIMA;  Surgeon: Bartley Lightning, MD;  Location: [facility] OR;  Service: Open Heart Surgery;  Laterality: N/A;  CYST REMOVAL NECK     CYSTECTOMY  1974   Intestine   CYSTECTOMY  1996   Brain stem   ESOPHAGOGASTRODUODENOSCOPY  12/07/2016   Schatzki's ring status post esophageal dilatation. Small hiatal hernia. Mild gastritis   EYE SURGERY  2003   EYE SURGERY  07/2016   LEFT HEART CATH AND CORONARY ANGIOGRAPHY N/A 07/08/2018   Procedure: LEFT HEART CATH AND CORONARY ANGIOGRAPHY;  Surgeon: Avanell Leigh, MD;  Location: [facility] INVASIVE CV LAB;  Service: Cardiovascular;  Laterality: N/A;   LEFT HEART CATH AND CORONARY ANGIOGRAPHY N/A 07/25/2019   Procedure: LEFT HEART CATH AND CORONARY ANGIOGRAPHY;  Surgeon: Odie Benne, MD;  Location: [facility] INVASIVE CV LAB;  Service: Cardiovascular;  Laterality: N/A;   MOUTH SURGERY  2013   RADIOLOGY WITH ANESTHESIA N/A 11/05/2019   Procedure: MRI WITH ANESTHESIA;  Surgeon: Radiologist, Medication, MD;  Location: [facility] OR;  Service: Radiology;  Laterality: N/A;   TEE WITHOUT CARDIOVERSION N/A 07/10/2018   Procedure: TRANSESOPHAGEAL ECHOCARDIOGRAM (TEE);  Surgeon: Bartley Lightning, MD;  Location: [facility];  Service: Open Heart Surgery;  Laterality: N/A;   TONSILLECTOMY     age 44    Family History  Problem Relation Age of Onset   Other Mother        blood disorder - unsure of name   Heart attack Father    Stroke Father    Cancer Sister    Cancer Brother    Asthma Brother    Cancer Brother    Social History   Socioeconomic History   Marital status: Married    Spouse name: Not on file   Number of children:  3   Years of education: college   Highest education level: Not on file  Occupational History   Occupation: Retired  Tobacco Use   Smoking status: Never   Smokeless tobacco: Never  Vaping Use   Vaping status: Never Used  Substance and Sexual Activity   Alcohol use: No   Drug use: No   Sexual activity: Not on file  Other Topics Concern   Not on file  Social History Narrative   Lives with husband in [place], [place].   Right-handed.   No daily caffeine use.   Social Drivers of Health   Financial Resource Strain: Medium Risk (10/19/2021)   Overall Financial Resource Strain (CARDIA)    Difficulty of Paying Living Expenses: Somewhat hard  Food Insecurity: No Food Insecurity (10/19/2021)   Hunger Vital Sign    Worried About Running Out of Food in the Last Year: Never true    Ran Out of Food in the Last Year: Never true  Transportation Needs: No Transportation Needs (05/09/2022)   PRAPARE - Administrator, Civil Service (Medical): No    Lack of Transportation (Non-Medical): No  Physical Activity: Inactive (05/09/2022)   Exercise Vital Sign    Days of Exercise per Week: 0 days    Minutes of Exercise per Session: 0 min  Stress: No Stress Concern Present (05/11/2021)   [employer] of Occupational Health - Occupational Stress Questionnaire    Feeling of Stress : Only a little  Social Connections: Moderately Integrated (04/19/2022)   Social Connection and Isolation Panel [NHANES]    Frequency of Communication with Friends and Family: Once a week    Frequency of Social Gatherings with Friends and Family: Twice a week    Attends Religious Services: More than 4 times per year    Active Member of Clubs or  Organizations: No    Attends Engineer, structural: Never    Marital Status: Married    Objective:  BP 126/76 Comment: right arm  Pulse 71   Temp (!) 97.3 F (36.3 C)   Resp 16   Ht 5\' 4"  (1.626 m)   Wt 101 lb (45.8 kg)   SpO2 98%   BMI 17.34 kg/m       06/13/2023   10:00 AM 06/13/2023    9:55 AM 04/27/2023    4:00 PM  BP/Weight  Systolic BP 126 110 190  Diastolic BP 76 60 80  Wt. (Lbs)  101   BMI  17.34 kg/m2     Physical Exam Vitals reviewed.  Constitutional:      Comments: Thin.  HENT:     Right Ear: Tympanic membrane normal.     Left Ear: Tympanic membrane normal.     Nose: Congestion present. No rhinorrhea.     Mouth/Throat:     Pharynx: No oropharyngeal exudate or posterior oropharyngeal erythema.  Neck:     Vascular: No carotid bruit.  Cardiovascular:     Rate and Rhythm: Normal rate and regular rhythm.     Heart sounds: Normal heart sounds.  Pulmonary:     Effort: Pulmonary effort is normal. No respiratory distress.     Breath sounds: Normal breath sounds.  Abdominal:     General: Abdomen is flat. Bowel sounds are normal.     Palpations: Abdomen is soft.     Tenderness: There is no abdominal tenderness.  Lymphadenopathy:     Cervical: No cervical adenopathy.  Neurological:     Mental Status: She is alert and oriented to person, place, and time.  Psychiatric:        Mood and Affect: Mood normal.        Behavior: Behavior normal.     Diabetic Foot Exam - Simple   No data filed      Lab Results  Component Value Date   WBC 8.0 03/15/2023   HGB 12.8 03/15/2023   HCT 37.9 03/15/2023   PLT 277 03/15/2023   GLUCOSE 91 03/15/2023   CHOL 225 (H) 03/15/2023   TRIG 71 03/15/2023   HDL 74 03/15/2023   LDLCALC 139 (H) 03/15/2023   ALT 12 03/15/2023   AST 16 03/15/2023   NA 140 03/15/2023   K 4.8 03/15/2023   CL 102 03/15/2023   CREATININE 0.77 03/15/2023   BUN 19 03/15/2023   CO2 23 03/15/2023   TSH 0.852 03/15/2023   INR 0.9 07/14/2020   HGBA1C 5.9 (H) 12/08/2021      Assessment & Plan:  Hypertensive heart disease with heart failure (HCC) Assessment & Plan: Well controlled.  No changes to medicines. Continue benicar  20 mg daily.  Continue to work on eating a healthy diet and exercise.  Labs  drawn today.    Orders: -     CBC with Differential/Platelet -     Comprehensive metabolic panel with GFR  Coronary artery disease involving native coronary artery of native heart with unstable angina pectoris [facility]) Assessment & Plan: Well controlled.  No changes to medicines. On olmesartan , praluen, and aspirin . Management per specialist.     Gastroesophageal reflux disease with esophagitis without hemorrhage Assessment & Plan: Start on voquezna  10 mg daily x 4 weeks.  Recommend follow up with GI.    Acquired hypothyroidism Assessment & Plan: Check thyroid labs. Continue Synthroid  88 mcg daily.  Orders: -     TSH  Mixed hyperlipidemia Assessment & Plan: Check lipid.  Unclear if patient taking praluent   Orders: -     Lipid panel  Prediabetes Assessment & Plan: Recommend continue to work on eating healthy diet and exercise.   Orders: -     Hemoglobin A1c -     Amoxicillin ; Take 1 tablet (875 mg total) by mouth 2 (two) times daily.  Dispense: 20 tablet; Refill: 0  Acute bronchitis due to other specified organisms Assessment & Plan: Kenalog  shot given. Amoxicillin  prescription.   Orders: -     DG Chest 2 View; Future -     Triamcinolone  Acetonide  Chronic obstructive pulmonary disease with acute lower respiratory infection (HCC) Assessment & Plan: Kenalog  shot given. Amoxicillin  prescription.       Meds ordered this encounter  Medications   triamcinolone  acetonide (KENALOG -40) injection 80 mg   amoxicillin  (AMOXIL ) 875 MG tablet    Sig: Take 1 tablet (875 mg total) by mouth 2 (two) times daily.    Dispense:  20 tablet    Refill:  0    Orders Placed This Encounter  Procedures   DG Chest 2 View   CBC with Differential/Platelet   Comprehensive metabolic panel with GFR   Hemoglobin A1c   Lipid panel   TSH     Follow-up: Return in about 4 weeks (around 07/11/2023) for chronic follow up.  An After Visit Summary was printed and given to the  patient.  Mercy Stall, MD [facility] [phone]

## 2023-06-13 ENCOUNTER — Ambulatory Visit (INDEPENDENT_AMBULATORY_CARE_PROVIDER_SITE_OTHER): Payer: Medicare Other | Admitting: Family Medicine

## 2023-06-13 ENCOUNTER — Encounter: Payer: Self-pay | Admitting: Family Medicine

## 2023-06-13 ENCOUNTER — Ambulatory Visit (HOSPITAL_BASED_OUTPATIENT_CLINIC_OR_DEPARTMENT_OTHER)
Admission: RE | Admit: 2023-06-13 | Discharge: 2023-06-13 | Disposition: A | Discharge status: Home / Self Care | Source: Ambulatory Visit | Attending: Family Medicine | Admitting: Family Medicine

## 2023-06-13 ENCOUNTER — Other Ambulatory Visit (HOSPITAL_BASED_OUTPATIENT_CLINIC_OR_DEPARTMENT_OTHER): Payer: Self-pay

## 2023-06-13 VITALS — BP 126/76 | HR 71 | Temp 97.3°F | Resp 16 | Ht 64.0 in | Wt 101.0 lb

## 2023-06-13 DIAGNOSIS — I110 Hypertensive heart disease with heart failure: Secondary | ICD-10-CM

## 2023-06-13 DIAGNOSIS — E039 Hypothyroidism, unspecified: Secondary | ICD-10-CM

## 2023-06-13 DIAGNOSIS — J440 Chronic obstructive pulmonary disease with acute lower respiratory infection: Secondary | ICD-10-CM | POA: Diagnosis not present

## 2023-06-13 DIAGNOSIS — I25110 Atherosclerotic heart disease of native coronary artery with unstable angina pectoris: Secondary | ICD-10-CM

## 2023-06-13 DIAGNOSIS — K2100 Gastro-esophageal reflux disease with esophagitis, without bleeding: Secondary | ICD-10-CM

## 2023-06-13 DIAGNOSIS — R7303 Prediabetes: Secondary | ICD-10-CM

## 2023-06-13 DIAGNOSIS — J208 Acute bronchitis due to other specified organisms: Secondary | ICD-10-CM

## 2023-06-13 DIAGNOSIS — E782 Mixed hyperlipidemia: Secondary | ICD-10-CM

## 2023-06-13 MED ORDER — TRIAMCINOLONE ACETONIDE 40 MG/ML IJ SUSP
80.0000 mg | Freq: Once | INTRAMUSCULAR | Status: AC
  Administered 2023-06-13: 80 mg via INTRAMUSCULAR

## 2023-06-13 MED ORDER — AMOXICILLIN 875 MG PO TABS
875.0000 mg | ORAL_TABLET | Freq: Two times a day (BID) | ORAL | 0 refills | Status: DC
  Filled 2023-06-13: qty 20, 10d supply, fill #0

## 2023-06-13 NOTE — Assessment & Plan Note (Signed)
 Recommend continue to work on eating healthy diet and exercise.

## 2023-06-13 NOTE — Assessment & Plan Note (Signed)
 Kenalog  shot given. Amoxicillin  prescription.

## 2023-06-13 NOTE — Assessment & Plan Note (Signed)
 Check thyroid labs. Continue Synthroid 88 mcg daily.

## 2023-06-13 NOTE — Patient Instructions (Addendum)
 Bronchitis: Order chest xray. Go to the med center [place]. Sent amoxicillin  (antibiotic) Recommend delsym cough syrup.   For Reflux: Start on voquezna  10 mg daily.  Stop pantoprazole .  Please call to get a follow up appointment with Dr. Feliberto Hopping with [facility] in the next 2-4 weeks.

## 2023-06-13 NOTE — Assessment & Plan Note (Signed)
Well controlled.  No changes to medicines. Continue benicar 20 mg daily.  Continue to work on eating a healthy diet and exercise.  Labs drawn today.

## 2023-06-13 NOTE — Assessment & Plan Note (Signed)
 Check lipid.  Unclear if patient taking praluent

## 2023-06-13 NOTE — Assessment & Plan Note (Signed)
 Well controlled.  No changes to medicines. On olmesartan , praluen, and aspirin . Management per specialist.

## 2023-06-13 NOTE — Assessment & Plan Note (Signed)
 Start on voquezna  10 mg daily x 4 weeks.  Recommend follow up with GI.

## 2023-06-19 ENCOUNTER — Other Ambulatory Visit (HOSPITAL_BASED_OUTPATIENT_CLINIC_OR_DEPARTMENT_OTHER): Payer: Self-pay

## 2023-06-19 ENCOUNTER — Ambulatory Visit: Payer: Self-pay

## 2023-06-19 ENCOUNTER — Other Ambulatory Visit: Payer: Self-pay

## 2023-06-19 DIAGNOSIS — E039 Hypothyroidism, unspecified: Secondary | ICD-10-CM

## 2023-06-19 LAB — CBC WITH DIFFERENTIAL/PLATELET
Basophils Absolute: 0.1 10*3/uL (ref 0.0–0.2)
Basos: 1 %
EOS (ABSOLUTE): 0.1 10*3/uL (ref 0.0–0.4)
Eos: 1 %
Hematocrit: 37.8 % (ref 34.0–46.6)
Hemoglobin: 12.3 g/dL (ref 11.1–15.9)
Immature Grans (Abs): 0.1 10*3/uL (ref 0.0–0.1)
Immature Granulocytes: 1 %
Lymphocytes Absolute: 1.4 10*3/uL (ref 0.7–3.1)
Lymphs: 13 %
MCH: 30.6 pg (ref 26.6–33.0)
MCHC: 32.5 g/dL (ref 31.5–35.7)
MCV: 94 fL (ref 79–97)
Monocytes Absolute: 1.7 10*3/uL — ABNORMAL HIGH (ref 0.1–0.9)
Monocytes: 15 %
Neutrophils Absolute: 7.9 10*3/uL — ABNORMAL HIGH (ref 1.4–7.0)
Neutrophils: 69 %
Platelets: 225 10*3/uL (ref 150–450)
RBC: 4.02 x10E6/uL (ref 3.77–5.28)
RDW: 13.5 % (ref 11.7–15.4)
WBC: 11.2 10*3/uL — ABNORMAL HIGH (ref 3.4–10.8)

## 2023-06-19 LAB — HEMOGLOBIN A1C
Est. average glucose Bld gHb Est-mCnc: 123 mg/dL
Hgb A1c MFr Bld: 5.9 % — ABNORMAL HIGH (ref 4.8–5.6)

## 2023-06-19 LAB — LIPID PANEL
Chol/HDL Ratio: 2.6 ratio (ref 0.0–4.4)
Cholesterol, Total: 179 mg/dL (ref 100–199)
HDL: 69 mg/dL (ref >39)
LDL Chol Calc (NIH): 97 mg/dL (ref 0–99)
Triglycerides: 66 mg/dL (ref 0–149)
VLDL Cholesterol Cal: 13 mg/dL (ref 5–40)

## 2023-06-19 LAB — COMPREHENSIVE METABOLIC PANEL WITH GFR
ALT: 18 IU/L (ref 0–32)
AST: 15 IU/L (ref 0–40)
Albumin: 4.2 g/dL (ref 3.6–4.6)
Alkaline Phosphatase: 84 IU/L (ref 44–121)
BUN/Creatinine Ratio: 14 (ref 12–28)
BUN: 11 mg/dL (ref 10–36)
Bilirubin Total: 0.6 mg/dL (ref 0.0–1.2)
CO2: 22 mmol/L (ref 20–29)
Calcium: 10.4 mg/dL — ABNORMAL HIGH (ref 8.7–10.3)
Chloride: 99 mmol/L (ref 96–106)
Creatinine, Ser: 0.77 mg/dL (ref 0.57–1.00)
Globulin, Total: 2.5 g/dL (ref 1.5–4.5)
Glucose: 95 mg/dL (ref 70–99)
Potassium: 4.4 mmol/L (ref 3.5–5.2)
Sodium: 134 mmol/L (ref 134–144)
Total Protein: 6.7 g/dL (ref 6.0–8.5)
eGFR: 73 mL/min/{1.73_m2} (ref >59)

## 2023-06-19 LAB — TSH: TSH: 0.618 u[IU]/mL (ref 0.450–4.500)

## 2023-06-19 MED ORDER — LEVOTHYROXINE SODIUM 88 MCG PO TABS
88.0000 ug | ORAL_TABLET | Freq: Every day | ORAL | 3 refills | Status: DC
  Filled 2023-06-19: qty 90, 90d supply, fill #0

## 2023-06-19 NOTE — Telephone Encounter (Signed)
 Patient stated that she has been without her thyroid mediation for 2 days now. Patient stated that she has not heard from anyone to advise if the Rx dosage will remain the same or if a new Rx will be sent in. Patient requests call back asap to advise. Call back# [phone]. Please advise on refill as well.

## 2023-06-20 ENCOUNTER — Other Ambulatory Visit: Payer: Self-pay

## 2023-06-20 ENCOUNTER — Other Ambulatory Visit (HOSPITAL_BASED_OUTPATIENT_CLINIC_OR_DEPARTMENT_OTHER): Payer: Self-pay

## 2023-06-20 DIAGNOSIS — E039 Hypothyroidism, unspecified: Secondary | ICD-10-CM

## 2023-06-20 MED ORDER — LEVOTHYROXINE SODIUM 88 MCG PO TABS: 88.0000 ug | ORAL_TABLET | Freq: Every day | ORAL | 3 refills | Status: AC

## 2023-07-05 ENCOUNTER — Other Ambulatory Visit (HOSPITAL_BASED_OUTPATIENT_CLINIC_OR_DEPARTMENT_OTHER): Payer: Self-pay

## 2023-07-05 ENCOUNTER — Other Ambulatory Visit: Payer: Self-pay | Admitting: Family Medicine

## 2023-07-05 MED ORDER — VOQUEZNA 10 MG PO TABS
10.0000 mg | ORAL_TABLET | Freq: Every day | ORAL | 2 refills | Status: DC
  Filled 2023-07-05 – 2023-07-10 (×2): qty 30, 30d supply, fill #0

## 2023-07-06 ENCOUNTER — Other Ambulatory Visit (HOSPITAL_BASED_OUTPATIENT_CLINIC_OR_DEPARTMENT_OTHER): Payer: Self-pay

## 2023-07-10 ENCOUNTER — Other Ambulatory Visit (HOSPITAL_BASED_OUTPATIENT_CLINIC_OR_DEPARTMENT_OTHER): Payer: Self-pay

## 2023-08-17 ENCOUNTER — Telehealth: Payer: Self-pay

## 2023-08-17 NOTE — Transitions of Care (Post Inpatient/ED Visit) (Unsigned)
 08/17/2023  Name: Leah Pittman MRN: [id number] DOB: [birth year]  Today's TOC FU Call Status: Today's TOC FU Call Status:: Successful TOC FU Call Completed TOC FU Call Complete Date: 08/17/23 Patient's Name and Date of Birth confirmed.  Transition Care Management Follow-up Telephone Call Date of Discharge: 08/16/23 Discharge Facility: Other ([facility]) Name of Other ([facility]) Discharge Facility: [facility] Type of Discharge: Inpatient Admission Primary Inpatient Discharge Diagnosis:: HD How have you been since you were released from the hospital?: Better Any questions or concerns?: No  Items Reviewed: Did you receive and understand the discharge instructions provided?: Yes Medications obtained,verified, and reconciled?: Yes (Medications Reviewed) Any new allergies since your discharge?: No Dietary orders reviewed?: Yes Do you have support at home?: Yes People in Home [RPT]: spouse, child(ren), adult  Medications Reviewed Today: Medications Reviewed Today     Reviewed by Emmitt Pan, LPN (Licensed Practical Nurse) on 08/17/23 at 1034  Med List Status: <None>   Medication Order Taking? Sig Documenting Provider Last Dose Status Informant  albuterol  (VENTOLIN  HFA) 108 (90 Base) MCG/ACT inhaler [id number] Yes Inhale 2 puffs into the lungs. [provider]  Active            Med Note BASILIO JON MARLA Pablo Feb 12, 2023  2:33 PM) As needed  Alirocumab (PRALUENT) 150 MG/ML SOAJ [id number]  Inject 1 mL (150 mg total) into the skin every 14 (fourteen) days.  Patient not taking: Reported on 08/17/2023   Sherre Clapper, MD  Active   amoxicillin  (AMOXIL ) 875 MG tablet [id number] Yes Take 1 tablet (875 mg total) by mouth 2 (two) times daily. Sherre Clapper, MD  Active   aspirin  81 MG tablet [id number] Yes Take 81 mg by mouth daily. [provider]  Active Self  Budeson-Glycopyrrol-Formoterol  (BREZTRI  AEROSPHERE) 160-9-4.8 MCG/ACT AERO [id number] Yes Inhale 2 puffs into  the lungs 2 (two) times daily as needed (for flares). [name], [name], MD  Active   Cholecalciferol  (VITAMIN D3 SUPER STRENGTH PO) [id number] Yes Take 1,000 Units by mouth daily after lunch. [provider]  Active Self  dorzolamide -timolol  (COSOPT ) 22.3-6.8 MG/ML ophthalmic solution [id number] Yes Place 1 drop into the left eye 2 (two) times daily.  [provider]  Active Self  ipratropium-albuterol  (DUONEB) 0.5-2.5 (3) MG/3ML SOLN [id number] Yes Take 3 mLs by nebulization every 6 (six) hours as needed. Ival Domino, FNP  Active   lactobacillus acidophilus (BACID) TABS tablet [id number] Yes Take 2 tablets by mouth 3 (three) times daily. [provider]  Active   latanoprost  (XALATAN ) 0.005 % ophthalmic solution [id number] Yes Place 1 drop into the left eye at bedtime. [provider]  Active   levothyroxine  (SYNTHROID ) 88 MCG tablet [id number] Yes Take 1 tablet (88 mcg total) by mouth daily. [name], [name], MD  Active   olmesartan  (BENICAR ) 20 MG tablet [id number] Yes Take 1 tablet (20 mg total) by mouth daily. Ival Domino, FNP  Active   Vonoprazan Fumarate  (VOQUEZNA ) 10 MG TABS [id number] Yes Take 10 mg by mouth daily. [name], [name], MD  Active             Home Care and Equipment/Supplies: Were Home Health Services Ordered?: Yes Name of Home Health Agency:: unknown Has Agency set up a time to come to your home?: No Any new equipment or medical supplies ordered?: NA  Functional Questionnaire: Do you need assistance with bathing/showering or dressing?: Yes Do you need assistance with meal preparation?: Yes Do you need assistance with eating?: No Do  you have difficulty maintaining continence: No Do you need assistance with getting out of bed/getting out of a chair/moving?: No Do you have difficulty managing or taking your medications?: Yes  Follow up appointments reviewed: PCP Follow-up appointment confirmed?: No (sent message to Dr. cox) MD Provider Line  Number:[phone] Given: No Specialist Hospital Follow-up appointment confirmed?: No Reason Specialist Follow-Up Not Confirmed: Patient has Specialist Provider Number and will Call for Appointment Do you need transportation to your follow-up appointment?: No Do you understand care options if your condition(s) worsen?: Yes-patient verbalized understanding    SIGNATURE Julian Lemmings, LPN [facility] Nurse Health Advisor Direct Dial [phone]

## 2023-08-20 ENCOUNTER — Telehealth: Payer: Self-pay

## 2023-08-20 NOTE — Telephone Encounter (Signed)
 Leah Pittman notified that the appointment has been rescheduled to 08/22/2023.

## 2023-08-20 NOTE — Telephone Encounter (Signed)
 Copied from CRM [id number]. Topic: Appointments - Scheduling Inquiry for Clinic >> Aug 17, 2023  1:07 PM Winona R wrote: Pt daughter Julianna calling to reschedule hospital follow up appointment however there are no availability.

## 2023-08-20 NOTE — Telephone Encounter (Signed)
 Spoke with Alisa, and okayed the following orders.    Copied from CRM [id number]. Topic: Clinical - Request for Lab/Test Order >> Aug 17, 2023  4:39 PM Leah Pittman wrote: Reason for CRM: Alisa Domino calling from [facility] is calling to request Home Health, Nursing, and aide. Frequency- 1 W 5, 4 PRN-Nursing 1 w 1 next week, 2 W 3- Home Health Aide  [phone] Verbal ok on Voice Mail

## 2023-08-21 ENCOUNTER — Telehealth: Payer: Self-pay

## 2023-08-21 ENCOUNTER — Inpatient Hospital Stay

## 2023-08-21 NOTE — Telephone Encounter (Signed)
 Verbal ok given for order below at number listed.  Copied from CRM [id number]. Topic: General - Other >> Aug 21, 2023 10:01 AM Larissa RAMAN wrote: Reason for CRM: Selinda, PT with [facility] requesting to extend home health order until 08/27/2023 due to patient having multiple appointments and not being able to see patient for services Callback #  [phone]

## 2023-08-22 ENCOUNTER — Other Ambulatory Visit (HOSPITAL_BASED_OUTPATIENT_CLINIC_OR_DEPARTMENT_OTHER): Payer: Self-pay

## 2023-08-22 ENCOUNTER — Ambulatory Visit (INDEPENDENT_AMBULATORY_CARE_PROVIDER_SITE_OTHER)

## 2023-08-22 VITALS — BP 150/64 | HR 63 | Temp 97.6°F | Resp 16 | Ht 64.0 in | Wt 101.0 lb

## 2023-08-22 DIAGNOSIS — R0789 Other chest pain: Secondary | ICD-10-CM | POA: Insufficient documentation

## 2023-08-22 DIAGNOSIS — K5900 Constipation, unspecified: Secondary | ICD-10-CM | POA: Diagnosis not present

## 2023-08-22 DIAGNOSIS — R0989 Other specified symptoms and signs involving the circulatory and respiratory systems: Secondary | ICD-10-CM

## 2023-08-22 DIAGNOSIS — K2100 Gastro-esophageal reflux disease with esophagitis, without bleeding: Secondary | ICD-10-CM | POA: Diagnosis not present

## 2023-08-22 HISTORY — DX: Other chest pain: R07.89

## 2023-08-22 MED ORDER — AMLODIPINE BESYLATE 5 MG PO TABS: 5.0000 mg | ORAL_TABLET | Freq: Every day | ORAL | 1 refills | Status: DC

## 2023-08-22 MED ORDER — AMLODIPINE BESYLATE 5 MG PO TABS
5.0000 mg | ORAL_TABLET | Freq: Every day | ORAL | 1 refills | Status: DC
  Filled 2023-08-22: qty 30, 30d supply, fill #0

## 2023-08-22 NOTE — Progress Notes (Signed)
 Subjective:  Patient ID: Leah Pittman, female    DOB: 1930/03/20  Age: 89 y.o. MRN: [id number]  Chief Complaint  Patient presents with   Transitions Of Care   Hypertension    HPI:  Discussed the use of AI scribe software for clinical note transcription with the patient, who gave verbal consent to proceed.  History of Present Illness   Leah Pittman is a 89 year old female with hypertension and acid reflux who presents for follow-up after hospitalization for high blood pressure. She is accompanied by her daughter.   Hypertension and blood pressure fluctuations - Recent hospitalization for elevated blood pressure - Blood pressure management with amlodipine  5 mg daily and olmesartan  20 mg daily - Medication regimen based on blood pressure readings: takes both medications if blood pressure is over 110, takes none if under 110 - Blood pressure readings fluctuate, with occasional readings as low as 85/40 - Headaches and pressure, particularly when lying down, associated with elevated blood pressure  Decreased mobility and functional status - Recent decline in mobility and strength, leading to admission to a rehabilitation facility - Currently able to ambulate with a walker; practicing walking without it at home - Completed one session of physical therapy - Received two nursing visits - Feels slightly stronger compared to prior to rehabilitation  Chest pain and respiratory symptoms - Intermittent, dull left-sided chest pain with deep inspiration, onset during physical exercise - Pain located between the shoulder blades - Pain is new, occurs after strenuous activity, and is associated with deep breaths - Uses an inhaler for breathing difficulties; used once in the past few days - No chest pain at rest  Gastroesophageal reflux and esophageal symptoms - History of acid reflux and esophageal issues, including a small hernia and dysphagia identified on x-ray - Currently taking  Voquezna  for acid reflux; uncertain of its effectiveness - Previously trialed pantoprazole  and other acid reflux medications with unclear benefit - Associates chest pain with acid reflux symptoms  Constipation - Chronic constipation managed with daily stool softener and MiraLAX  - Increasing fluid intake and consuming fruits and vegetables regularly  Ophthalmologic and neurologic symptoms - Headaches and pressure, particularly when lying down - History of possible temporal arteritis, with associated vision and hearing changes - Crusty eyes in the morning - Upcoming ophthalmology appointment         11/07/2022    9:54 AM 07/20/2022    2:24 PM 06/29/2022    2:13 PM 04/19/2022    1:34 PM 06/29/2021    9:28 AM  Depression screen PHQ 2/9  Decreased Interest 0 0 0 0 0  Down, Depressed, Hopeless 0 0 0 0 0  PHQ - 2 Score 0 0 0 0 0  Altered sleeping   2    Tired, decreased energy   3    Change in appetite   3    Feeling bad or failure about yourself    0    Trouble concentrating   0    Moving slowly or fidgety/restless   0    Suicidal thoughts   0    PHQ-9 Score   8    Difficult doing work/chores   Somewhat difficult          11/07/2022    9:53 AM  Fall Risk   Falls in the past year? 0  Number falls in past yr: 0  Injury with Fall? 0  Risk for fall due to : Impaired balance/gait;Impaired vision  Follow  up Falls evaluation completed;Falls prevention discussed    Patient Care Team: Sherre Clapper, MD as PCP - General (Family Medicine) Croitoru, Jerel, MD as PCP - Cardiology (Cardiology) Maurilio Camellia PARAS, MD as Consulting Physician (Allergy and Immunology) Charlanne Groom, MD as Consulting Physician (Gastroenterology) Nyle Rankin POUR, [facility] (Inactive) (Pharmacist)   Review of Systems  Constitutional:  Negative for chills, fatigue and fever.  HENT:  Negative for congestion, ear pain and sore throat.   Respiratory:  Negative for cough and shortness of breath.   Cardiovascular:  Negative  for chest pain (chest wall pain) and palpitations.  Gastrointestinal:  Positive for constipation. Negative for abdominal pain, diarrhea, nausea and vomiting.  Endocrine: Negative for polydipsia, polyphagia and polyuria.  Genitourinary:  Negative for difficulty urinating and dysuria.  Musculoskeletal:  Negative for arthralgias, back pain and myalgias.  Skin:  Negative for rash.  Neurological:  Positive for weakness. Negative for headaches.  Psychiatric/Behavioral:  Negative for dysphoric mood. The patient is not nervous/anxious.     Current Outpatient Medications on File Prior to Visit  Medication Sig Dispense Refill   albuterol  (VENTOLIN  HFA) 108 (90 Base) MCG/ACT inhaler Inhale 2 puffs into the lungs.     aspirin  81 MG tablet Take 81 mg by mouth daily.     Budeson-Glycopyrrol-Formoterol  (BREZTRI  AEROSPHERE) 160-9-4.8 MCG/ACT AERO Inhale 2 puffs into the lungs 2 (two) times daily as needed (for flares). 10.7 g 11   Cholecalciferol  (VITAMIN D3 SUPER STRENGTH PO) Take 1,000 Units by mouth daily after lunch.     docusate sodium  (COLACE) 50 MG capsule Take 50 mg by mouth 2 (two) times daily.     dorzolamide -timolol  (COSOPT ) 22.3-6.8 MG/ML ophthalmic solution Place 1 drop into the left eye 2 (two) times daily.      ipratropium-albuterol  (DUONEB) 0.5-2.5 (3) MG/3ML SOLN Take 3 mLs by nebulization every 6 (six) hours as needed. 360 mL 0   lactobacillus acidophilus (BACID) TABS tablet Take 2 tablets by mouth 3 (three) times daily.     latanoprost  (XALATAN ) 0.005 % ophthalmic solution Place 1 drop into the left eye at bedtime.     levothyroxine  (SYNTHROID ) 88 MCG tablet Take 1 tablet (88 mcg total) by mouth daily. 90 tablet 3   olmesartan  (BENICAR ) 20 MG tablet Take 1 tablet (20 mg total) by mouth daily. 90 tablet 0   pantoprazole  (PROTONIX ) 20 MG tablet Take 20 mg by mouth daily.     Alirocumab (PRALUENT) 150 MG/ML SOAJ Inject 1 mL (150 mg total) into the skin every 14 (fourteen) days. (Patient not  taking: Reported on 08/22/2023) 2 mL 2   amoxicillin  (AMOXIL ) 875 MG tablet Take 1 tablet (875 mg total) by mouth 2 (two) times daily. 20 tablet 0   Vonoprazan Fumarate  (VOQUEZNA ) 10 MG TABS Take 10 mg by mouth daily. (Patient not taking: Reported on 08/22/2023) 30 tablet 2   No current facility-administered medications on file prior to visit.   Past Medical History:  Diagnosis Date   Anxiety    Arthritis    Chest pain 2011   CARDIOLITE  - no significant symptoms, EKG changes, or arrhythmias   Congenital prolapse of bladder mucosa    Diverticulosis    Dyspnea 02/16/2012   2D ECHO - EF 55-60%, normal   Esophageal dysphagia    Fatigue    GERD (gastroesophageal reflux disease)    Glaucoma    Headache(784.0)    Heart attack (HCC)    Hiatal hernia    High blood pressure 02/16/2012  RENAL DOPPLER - normal   Hyperlipidemia    Hypothyroidism    Labile hypertension    Lightheadedness    Migraine headache    Multiple allergies    Myalgia    NSTEMI (non-ST elevated myocardial infarction) (HCC) 07/14/2020   OSA (obstructive sleep apnea)    Pain, lower leg    Calf   Problem of menstruation    Proteinuria    Reflux    SOB (shortness of breath)    Thyroid disease    Urinary problem    Valvular heart disease    Vitamin B12 deficiency    Vitamin D  deficiency    Past Surgical History:  Procedure Laterality Date   ABDOMINAL HYSTERECTOMY  1976   CHOLECYSTECTOMY  2000   COLONOSCOPY  07/27/2014   Moderate predominantly sigmoid divertuculosis. Otherwise normal colonoscopy   CORONARY ARTERY BYPASS GRAFT N/A 07/10/2018   Procedure: CORONARY ARTERY BYPASS GRAFTING (CABG), FREE LIMA;  Surgeon: Lucas Dorise POUR, MD;  Location: [facility] OR;  Service: Open Heart Surgery;  Laterality: N/A;   CYST REMOVAL NECK     CYSTECTOMY  1974   Intestine   CYSTECTOMY  1996   Brain stem   ESOPHAGOGASTRODUODENOSCOPY  12/07/2016   Schatzki's ring status post esophageal dilatation. Small hiatal hernia. Mild gastritis    EYE SURGERY  2003   EYE SURGERY  07/2016   LEFT HEART CATH AND CORONARY ANGIOGRAPHY N/A 07/08/2018   Procedure: LEFT HEART CATH AND CORONARY ANGIOGRAPHY;  Surgeon: Court Dorn PARAS, MD;  Location: [facility] INVASIVE CV LAB;  Service: Cardiovascular;  Laterality: N/A;   LEFT HEART CATH AND CORONARY ANGIOGRAPHY N/A 07/25/2019   Procedure: LEFT HEART CATH AND CORONARY ANGIOGRAPHY;  Surgeon: Verlin Lonni BIRCH, MD;  Location: [facility] INVASIVE CV LAB;  Service: Cardiovascular;  Laterality: N/A;   MOUTH SURGERY  2013   RADIOLOGY WITH ANESTHESIA N/A 11/05/2019   Procedure: MRI WITH ANESTHESIA;  Surgeon: Radiologist, Medication, MD;  Location: [facility] OR;  Service: Radiology;  Laterality: N/A;   TEE WITHOUT CARDIOVERSION N/A 07/10/2018   Procedure: TRANSESOPHAGEAL ECHOCARDIOGRAM (TEE);  Surgeon: Lucas Dorise POUR, MD;  Location: [facility] OR;  Service: Open Heart Surgery;  Laterality: N/A;   TONSILLECTOMY     age 80    Family History  Problem Relation Age of Onset   Other Mother        blood disorder - unsure of name   Heart attack Father    Stroke Father    Cancer Sister    Cancer Brother    Asthma Brother    Cancer Brother    Social History   Socioeconomic History   Marital status: Married    Spouse name: Not on file   Number of children: 3   Years of education: college   Highest education level: Not on file  Occupational History   Occupation: Retired  Tobacco Use   Smoking status: Never   Smokeless tobacco: Never  Vaping Use   Vaping status: Never Used  Substance and Sexual Activity   Alcohol use: No   Drug use: No   Sexual activity: Not on file  Other Topics Concern   Not on file  Social History Narrative   Lives with husband in [place], [place].   Right-handed.   No daily caffeine use.   Social Drivers of Health   Financial Resource Strain: Medium Risk (10/19/2021)   Overall Financial Resource Strain (CARDIA)    Difficulty of Paying Living Expenses: Somewhat hard  Food Insecurity: No Food  Insecurity (10/19/2021)   Hunger Vital Sign    Worried About Running Out of Food in the Last Year: Never true    Ran Out of Food in the Last Year: Never true  Transportation Needs: No Transportation Needs (05/09/2022)   PRAPARE - Administrator, Civil Service (Medical): No    Lack of Transportation (Non-Medical): No  Physical Activity: Inactive (05/09/2022)   Exercise Vital Sign    Days of Exercise per Week: 0 days    Minutes of Exercise per Session: 0 min  Stress: No Stress Concern Present (05/11/2021)   [employer] of Occupational Health - Occupational Stress Questionnaire    Feeling of Stress : Only a little  Social Connections: Moderately Integrated (04/19/2022)   Social Connection and Isolation Panel    Frequency of Communication with Friends and Family: Once a week    Frequency of Social Gatherings with Friends and Family: Twice a week    Attends Religious Services: More than 4 times per year    Active Member of [employer] or Organizations: No    Attends Engineer, structural: Never    Marital Status: Married    Objective:  BP (!) 150/64   Pulse 63   Temp 97.6 F (36.4 C)   Resp 16   Ht 5' 4 (1.626 m)   Wt 101 lb (45.8 kg)   SpO2 99%   BMI 17.34 kg/m      08/22/2023    2:55 PM 08/22/2023    2:42 PM 06/13/2023   10:00 AM  BP/Weight  Systolic BP 150 180 126  Diastolic BP 64 70 76  Wt. (Lbs)  101   BMI  17.34 kg/m2     Physical Exam Vitals and nursing note reviewed.  Constitutional:      Comments: Thin built female  HENT:     Head: Normocephalic and atraumatic.     Mouth/Throat:     Mouth: Mucous membranes are moist.  Cardiovascular:     Rate and Rhythm: Normal rate and regular rhythm.  Pulmonary:     Effort: Pulmonary effort is normal.     Breath sounds: Rales (few scatteres rales bilaterally) present.  Chest:     Chest wall: Tenderness (chest wall tenderness in her left chest wall) present.  Musculoskeletal:     Cervical back: Normal  range of motion.  Skin:    General: Skin is warm.  Neurological:     General: No focal deficit present.     Mental Status: She is alert.     Comments: Weaker strength in both lower extremities. Generalized weakness from deconditioning  Psychiatric:        Mood and Affect: Mood normal.         Lab Results  Component Value Date   WBC 11.2 (H) 06/13/2023   HGB 12.3 06/13/2023   HCT 37.8 06/13/2023   PLT 225 06/13/2023   GLUCOSE 95 06/13/2023   CHOL 179 06/13/2023   TRIG 66 06/13/2023   HDL 69 06/13/2023   LDLCALC 97 06/13/2023   ALT 18 06/13/2023   AST 15 06/13/2023   NA 134 06/13/2023   K 4.4 06/13/2023   CL 99 06/13/2023   CREATININE 0.77 06/13/2023   BUN 11 06/13/2023   CO2 22 06/13/2023   TSH 0.618 06/13/2023   INR 0.9 07/14/2020   HGBA1C 5.9 (H) 06/13/2023      Assessment & Plan:     Left-sided chest wall pain Assessment & Plan: Intermittent left-sided  chest pain exacerbated by deep breathing and physical exertion, with pain between the shoulder blades. Tenderness on palpation suggests a musculoskeletal origin, possibly costochondritis or muscle strain. Differential diagnosis includes rib fracture, pulmonary embolism, or pneumonia, but these are less likely given the absence of trauma or other symptoms. She prefers to wait and see if the pain resolves before pursuing further imaging. Informed that if a rib fracture is present, there is limited intervention unless it is displaced. - Advise application of heat or ice to the painful area as needed. - Encourage deep breathing exercises despite discomfort. - Instruct to call the office if pain worsens or persists, to consider ordering a chest x-ray.  Plan: does not appear to be a rib fracture as she has had no falls or severe cough etc. But could have a bruised rib., Advised to make sure to take deep breaths, let the office know if the pain gets any worse, to go to the ED for any severe discomfort   Labile  hypertension Assessment & Plan: - Recent hospitalization for elevated blood pressure - Blood pressure management with amlodipine  5 mg daily and olmesartan  20 mg daily - Medication regimen based on blood pressure readings: takes both medications if blood pressure is over 110, takes OLMESARTAN  ONLY if under 110 systolic - Blood pressure readings fluctuate, with occasional readings as low as 85/40 - Headaches and pressure, particularly when lying down, associated with elevated blood pressure - Refill amlodipine  prescription and ensure availability at the pharmacy. - Consider purchasing amlodipine  without insurance if necessary, as it is inexpensive.  Advised to monitor home BP and bring a log if she can to her next appt   Gastroesophageal reflux disease with esophagitis without hemorrhage Assessment & Plan: Chronic acid reflux with esophageal deterioration. Currently on pantoprazole , but effectiveness is uncertain. Previous trials of different medications have not provided significant relief. Symptoms include occasional chest pain relieved by water, suggesting a possible link to GERD. Informed that esophageal deterioration is present and requires monitoring. - Continue pantoprazole  as prescribed. - Monitor symptoms and adjust treatment if necessary.   Constipation, unspecified constipation type Assessment & Plan: Constipation Chronic constipation with recent advice to use stool softeners and MiraLAX . Advised to increase fluid and fiber intake. Informed that increasing fluid and fiber intake is essential for managing constipation. - Continue taking stool softeners and MiraLAX  daily unless diarrhea occurs. - Increase fluid intake, specifically 8 ounces of water before and after meals. - Maintain a diet high in fiber with fruits and vegetables.  Rehabilitation Ongoing physical therapy and nursing support at home. 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 Informed  that continued physical therapy and nursing support are crucial for recovery. - Continue physical therapy sessions for the next three weeks. - Ensure nursing visits twice a week. - Encourage use of a walker for stability and to prevent falls.       Other orders -     amLODIPine  Besylate; Take 1 tablet (5 mg total) by mouth daily.  Dispense: 90 tablet; Refill: 1           Meds ordered this encounter  Medications   DISCONTD: amLODipine  (NORVASC ) 5 MG tablet    Sig: Take 1 tablet (5 mg total) by mouth daily.    Dispense:  90 tablet    Refill:  1    Please DISPENSE as patient is running out of medication. If insurance says too early, patient could pay out of pocket using GoodRx coupon  amLODipine  (NORVASC ) 5 MG tablet    Sig: Take 1 tablet (5 mg total) by mouth daily.    Dispense:  90 tablet    Refill:  1    Please DISPENSE as patient is running out of medication. If insurance says too early, patient could pay out of pocket using GoodRx coupon    No orders of the defined types were placed in this encounter.    Follow-up: Return in about 4 weeks (around 09/19/2023) for chronic disease follow up.  An After Visit Summary was printed and given to the patient.  Elynn Patteson, MD [name] [phone]

## 2023-08-22 NOTE — Assessment & Plan Note (Addendum)
-   Recent hospitalization for elevated blood pressure - Blood pressure management with amlodipine  5 mg daily and olmesartan  20 mg daily - Medication regimen based on blood pressure readings: takes both medications if blood pressure is over 110, takes OLMESARTAN  ONLY if under 110 systolic - Blood pressure readings fluctuate, with occasional readings as low as 85/40 - Headaches and pressure, particularly when lying down, associated with elevated blood pressure - Refill amlodipine  prescription and ensure availability at the pharmacy. - Consider purchasing amlodipine  without insurance if necessary, as it is inexpensive.  Advised to monitor home BP and bring a log if she can to her next appt

## 2023-08-22 NOTE — Assessment & Plan Note (Signed)
 Chronic acid reflux with esophageal deterioration. Currently on pantoprazole , but effectiveness is uncertain. Previous trials of different medications have not provided significant relief. Symptoms include occasional chest pain relieved by water, suggesting a possible link to GERD. Informed that esophageal deterioration is present and requires monitoring. - Continue pantoprazole  as prescribed. - Monitor symptoms and adjust treatment if necessary.

## 2023-08-22 NOTE — Assessment & Plan Note (Addendum)
 Intermittent left-sided chest pain exacerbated by deep breathing and physical exertion, with pain between the shoulder blades. Tenderness on palpation suggests a musculoskeletal origin, possibly costochondritis or muscle strain. Differential diagnosis includes rib fracture, pulmonary embolism, or pneumonia, but these are less likely given the absence of trauma or other symptoms. She prefers to wait and see if the pain resolves before pursuing further imaging. Informed that if a rib fracture is present, there is limited intervention unless it is displaced. - Advise application of heat or ice to the painful area as needed. - Encourage deep breathing exercises despite discomfort. - Instruct to call the office if pain worsens or persists, to consider ordering a chest x-ray.  Plan: does not appear to be a rib fracture as she has had no falls or severe cough etc. But could have a bruised rib., Advised to make sure to take deep breaths, let the office know if the pain gets any worse, to go to the ED for any severe discomfort

## 2023-08-22 NOTE — Assessment & Plan Note (Signed)
 Constipation Chronic constipation with recent advice to use stool softeners and MiraLAX . Advised to increase fluid and fiber intake. Informed that increasing fluid and fiber intake is essential for managing constipation. - Continue taking stool softeners and MiraLAX  daily unless diarrhea occurs. - Increase fluid intake, specifically 8 ounces of water before and after meals. - Maintain a diet high in fiber with fruits and vegetables.  Rehabilitation Ongoing physical therapy and nursing support at home. 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 Informed that continued physical therapy and nursing support are crucial for recovery. - Continue physical therapy sessions for the next three weeks. - Ensure nursing visits twice a week. - Encourage use of a walker for stability and to prevent falls.

## 2023-08-23 ENCOUNTER — Telehealth: Payer: Self-pay

## 2023-08-23 NOTE — Telephone Encounter (Signed)
 Spoke with [facility] and [place] the orders below.   Copied from CRM [id number]. Topic: Clinical - Home Health Verbal Orders >> Aug 23, 2023 10:12 AM Nathanel BROCKS wrote: Caller/Agency: [facility] Callback Number: [phone] Service Requested: Physical Therapy Frequency: 2 weeks 6 and 1 week 1 Any new concerns about the patient? No

## 2023-09-04 ENCOUNTER — Telehealth: Payer: Self-pay

## 2023-09-04 DIAGNOSIS — I2510 Atherosclerotic heart disease of native coronary artery without angina pectoris: Secondary | ICD-10-CM

## 2023-09-04 DIAGNOSIS — J449 Chronic obstructive pulmonary disease, unspecified: Secondary | ICD-10-CM

## 2023-09-04 DIAGNOSIS — M316 Other giant cell arteritis: Secondary | ICD-10-CM | POA: Diagnosis not present

## 2023-09-04 DIAGNOSIS — F411 Generalized anxiety disorder: Secondary | ICD-10-CM

## 2023-09-04 DIAGNOSIS — I739 Peripheral vascular disease, unspecified: Secondary | ICD-10-CM

## 2023-09-04 DIAGNOSIS — E43 Unspecified severe protein-calorie malnutrition: Secondary | ICD-10-CM | POA: Diagnosis not present

## 2023-09-04 DIAGNOSIS — I4891 Unspecified atrial fibrillation: Secondary | ICD-10-CM

## 2023-09-04 DIAGNOSIS — F410 Panic disorder [episodic paroxysmal anxiety] without agoraphobia: Secondary | ICD-10-CM

## 2023-09-04 DIAGNOSIS — I700 Atherosclerosis of aorta: Secondary | ICD-10-CM

## 2023-09-04 DIAGNOSIS — I71012 Dissection of descending thoracic aorta: Secondary | ICD-10-CM

## 2023-09-04 DIAGNOSIS — I110 Hypertensive heart disease with heart failure: Secondary | ICD-10-CM | POA: Diagnosis not present

## 2023-09-04 DIAGNOSIS — I509 Heart failure, unspecified: Secondary | ICD-10-CM | POA: Diagnosis not present

## 2023-09-04 NOTE — Telephone Encounter (Signed)
 Spoke with [facility] the following orders below.  Copied from CRM [id number]. Topic: Clinical - Home Health Verbal Orders >> Sep 04, 2023  1:23 PM DeAngela L wrote: Caller/Agency: Garrel calling with [facility]  Callback Number: [phone] secured line  Service Requested: Occupational Therapy Frequency: 1w1 2w2 1w1  Any new concerns about the patient? Yes Pt was diagnosed with giant cell arteritis and her vision is getting worse

## 2023-09-07 ENCOUNTER — Telehealth: Payer: Self-pay | Admitting: Family Medicine

## 2023-09-07 NOTE — Telephone Encounter (Signed)
 [facility] Supplemental Order from 08/31/23-09/23/23

## 2023-09-12 ENCOUNTER — Telehealth: Payer: Self-pay

## 2023-09-12 ENCOUNTER — Other Ambulatory Visit: Payer: Self-pay

## 2023-09-12 ENCOUNTER — Other Ambulatory Visit (HOSPITAL_BASED_OUTPATIENT_CLINIC_OR_DEPARTMENT_OTHER): Payer: Self-pay

## 2023-09-12 MED ORDER — BREZTRI AEROSPHERE 160-9-4.8 MCG/ACT IN AERO
2.0000 | INHALATION_SPRAY | Freq: Two times a day (BID) | RESPIRATORY_TRACT | 11 refills | Status: DC | PRN
  Filled 2023-09-12 – 2023-09-19 (×2): qty 10.7, 30d supply, fill #0

## 2023-09-12 NOTE — Telephone Encounter (Signed)
 FYI  Copied from CRM [id number]. Topic: General - Other >> Sep 12, 2023  2:26 PM Selinda RAMAN wrote: Reason for CRM: Marjorie the OT with [facility] called in stating the patients blood pressure was 122/48 but she reports no symptoms. She states she knows the patient seems to run low during the day and it goes back up at night. She just wanted her provider to know.

## 2023-09-12 NOTE — Telephone Encounter (Signed)
 Reviewed. Dr Sedalia Muta

## 2023-09-12 NOTE — Telephone Encounter (Signed)
 Copied from CRM #[id number]. Topic: Clinical - Prescription Issue >> Sep 12, 2023  2:24 PM Selinda RAMAN wrote: Reason for CRM: Marjorie OT with [facility] called in stating she noticed the patients prescription for her Budeson-Glycopyrrol-Formoterol  (BREZTRI  AEROSPHERE) 160-9-4.8 MCG/ACT AERO is out of date stating it expired in September of 2024 and needed to be discarded in March 2025. Can a new prescription for this be called into her pharmacy if this is ok with her provider as she states it does help her along with her nebulizer. Please assist patient further

## 2023-09-17 ENCOUNTER — Other Ambulatory Visit (HOSPITAL_COMMUNITY): Payer: Self-pay

## 2023-09-17 ENCOUNTER — Other Ambulatory Visit: Payer: Self-pay | Admitting: Family Medicine

## 2023-09-17 ENCOUNTER — Other Ambulatory Visit (HOSPITAL_BASED_OUTPATIENT_CLINIC_OR_DEPARTMENT_OTHER): Payer: Self-pay

## 2023-09-17 ENCOUNTER — Other Ambulatory Visit: Payer: Self-pay

## 2023-09-17 MED ORDER — IPRATROPIUM-ALBUTEROL 0.5-2.5 (3) MG/3ML IN SOLN: 3.0000 mL | Freq: Four times a day (QID) | RESPIRATORY_TRACT | 0 refills | Status: DC | PRN

## 2023-09-17 NOTE — Telephone Encounter (Signed)
 Copied from CRM [id number]. Topic: Clinical - Medication Refill >> Sep 17, 2023  4:31 PM Donee H wrote: Medication: ipratropium-albuterol  (DUONEB) 0.5-2.5 (3) MG/3ML SOLN   Has the patient contacted their pharmacy? Yes, it was sent to the incorrect pharmacy. Patient would like [facility] to [facility]  This is the patient's preferred pharmacy:  [facility] [address] [place] - [address] [place] [address] Phone: [phone] Fax: [phone]  Is this the correct pharmacy for this prescription? Yes .   Has the prescription been filled recently? No  Is the patient out of the medication? Yes, she stated would like for it to be filled as soon as possible   Has the patient been seen for an appointment in the last year OR does the patient have an upcoming appointment? Yes  Can we respond through MyChart? No, would like to be contacted via phone number [phone] once order as been placed  Agent: Please be advised that Rx refills may take up to 3 business days. We ask that you follow-up with your pharmacy.

## 2023-09-18 ENCOUNTER — Other Ambulatory Visit: Payer: Self-pay | Admitting: Family Medicine

## 2023-09-18 ENCOUNTER — Other Ambulatory Visit (HOSPITAL_COMMUNITY): Payer: Self-pay

## 2023-09-19 ENCOUNTER — Other Ambulatory Visit: Payer: Self-pay

## 2023-09-19 ENCOUNTER — Other Ambulatory Visit (HOSPITAL_BASED_OUTPATIENT_CLINIC_OR_DEPARTMENT_OTHER): Payer: Self-pay

## 2023-09-19 DIAGNOSIS — Z79899 Other long term (current) drug therapy: Secondary | ICD-10-CM

## 2023-09-20 ENCOUNTER — Telehealth: Payer: Self-pay | Admitting: Family Medicine

## 2023-09-20 ENCOUNTER — Ambulatory Visit: Payer: Self-pay

## 2023-09-20 NOTE — Telephone Encounter (Signed)
 FYI Only or Action Required?: FYI only for provider.  Patient was last seen in primary care on 08/22/2023 by Sirivol, Mamatha, MD.  Called Nurse Triage reporting Shortness of Breath.  Symptoms began several days ago.  Interventions attempted: Prescription medications: breathing treatments.  Symptoms are: unchanged.  Triage Disposition: See HCP Within 4 Hours (Or PCP Triage) declined sooner apt; states has home health.  Patient/caregiver understands and will follow disposition?: No, refuses disposition     Copied from CRM [id number]. Topic: Clinical - Red Word Triage >> Sep 20, 2023  2:19 PM Leah Pittman wrote: Red Word that prompted transfer to Nurse Triage: patient called in said she was having shortness of breath and its not getting any better its worse, she also said she has a cramp in her neck and the top of her head. Her head is hurting and she's feeling dizzy Reason for Disposition  [1] MILD difficulty breathing (e.g., minimal/no SOB at rest, SOB with walking, pulse < 100) AND [2] NEW-onset or WORSE than normal  Answer Assessment - Initial Assessment Questions 1. RESPIRATORY STATUS: Describe your breathing? (e.g., wheezing, shortness of breath, unable to speak, severe coughing)      Shortness of breath, voice hoarse,  2. ONSET: When did this breathing problem begin?      Neck started cramping, states bp is up and started couple days ago 3. PATTERN Does the difficult breathing come and go, or has it been constant since it started?      constant 4. SEVERITY: How bad is your breathing? (e.g., mild, moderate, severe)      Moderate to severe.  Physical therapist came into home and this RN has her checking her pulse ox. 5. RECURRENT SYMPTOM: Have you had difficulty breathing before? If Yes, ask: When was the last time? and What happened that time?      yes 6. CARDIAC HISTORY: Do you have any history of heart disease? (e.g., heart attack, angina, bypass surgery,  angioplasty)      bypass 7. LUNG HISTORY: Do you have any history of lung disease?  (e.g., pulmonary embolus, asthma, emphysema)     denies 8. CAUSE: What do you think is causing the breathing problem?      unknown 9. OTHER SYMPTOMS: Do you have any other symptoms? (e.g., chest pain, cough, dizziness, fever, runny nose)     Weakness,  10. O2 SATURATION MONITOR:  Do you use an oxygen saturation monitor (pulse oximeter) at home? If Yes, ask: What is your reading (oxygen level) today? What is your usual oxygen saturation reading? (e.g., 95%)       99% 11. PREGNANCY: Is there any chance you are pregnant? When was your last menstrual period?       na 12. TRAVEL: Have you traveled out of the country in the last month? (e.g., travel history, exposures)       no  Protocols used: Breathing Difficulty-A-AH

## 2023-09-20 NOTE — Telephone Encounter (Signed)
 [facility] Supplemental Orders from 09/14/23 to 09/14/23.

## 2023-09-27 ENCOUNTER — Ambulatory Visit (INDEPENDENT_AMBULATORY_CARE_PROVIDER_SITE_OTHER): Admitting: Family Medicine

## 2023-09-27 ENCOUNTER — Other Ambulatory Visit (HOSPITAL_BASED_OUTPATIENT_CLINIC_OR_DEPARTMENT_OTHER): Payer: Self-pay

## 2023-09-27 ENCOUNTER — Telehealth: Payer: Self-pay

## 2023-09-27 ENCOUNTER — Other Ambulatory Visit: Payer: Self-pay | Admitting: Family Medicine

## 2023-09-27 ENCOUNTER — Encounter: Payer: Self-pay | Admitting: Family Medicine

## 2023-09-27 VITALS — BP 150/60 | HR 74 | Temp 97.4°F | Resp 16 | Ht 64.0 in | Wt 101.0 lb

## 2023-09-27 DIAGNOSIS — I25110 Atherosclerotic heart disease of native coronary artery with unstable angina pectoris: Secondary | ICD-10-CM | POA: Diagnosis not present

## 2023-09-27 DIAGNOSIS — K2100 Gastro-esophageal reflux disease with esophagitis, without bleeding: Secondary | ICD-10-CM | POA: Diagnosis not present

## 2023-09-27 DIAGNOSIS — J410 Simple chronic bronchitis: Secondary | ICD-10-CM

## 2023-09-27 DIAGNOSIS — I110 Hypertensive heart disease with heart failure: Secondary | ICD-10-CM

## 2023-09-27 DIAGNOSIS — E782 Mixed hyperlipidemia: Secondary | ICD-10-CM | POA: Diagnosis not present

## 2023-09-27 DIAGNOSIS — R7303 Prediabetes: Secondary | ICD-10-CM

## 2023-09-27 HISTORY — DX: Simple chronic bronchitis: J41.0

## 2023-09-27 MED ORDER — BREZTRI AEROSPHERE 160-9-4.8 MCG/ACT IN AERO
2.0000 | INHALATION_SPRAY | Freq: Two times a day (BID) | RESPIRATORY_TRACT | 10 refills | Status: AC
  Filled 2023-09-27: qty 10.7, 30d supply, fill #0

## 2023-09-27 NOTE — Assessment & Plan Note (Signed)
 Continue Breztri  2 puffs twice daily. Will send new prescription to the med center in [place] to see if we can get a prior authorization to approve. Patient to see me our pharmacist. Patient is unable to tell me if she has tried any other combined inhalers.

## 2023-09-27 NOTE — Assessment & Plan Note (Signed)
 -  Continue pantoprazole 20 mg daily

## 2023-09-27 NOTE — Progress Notes (Signed)
   09/27/2023  Patient ID: Leah Pittman Shams, female   DOB: November 17, 1930, 89 y.o.   MRN: [id number]  Attempted to reach patient regarding OV for next week to discuss inhaler assistance for breztri . I also reviewed insurance through test claim and patient would have over $500 remaining on deductible + copay if she was to receive at the pharmacy. I am hoping she can qualify for patient assistance through [employer].   Left message for her to return call to my direct line below.   Lang Sieve, PharmD, BCGP Clinical Pharmacist  [phone]

## 2023-09-27 NOTE — Assessment & Plan Note (Deleted)
Continue Topamax 50 mg twice daily

## 2023-09-27 NOTE — Assessment & Plan Note (Signed)
 Continue aspirin  81 mg daily. Continue regular follow-up with cardiology.

## 2023-09-27 NOTE — Assessment & Plan Note (Signed)
 Await labs to determine status.   Recommend healthy diet. Check lipids today.

## 2023-09-27 NOTE — Assessment & Plan Note (Signed)
 Check A1c.

## 2023-09-27 NOTE — Progress Notes (Signed)
 Subjective:  Patient ID: Leah Pittman, female    DOB: [birth year]  Age: 89 y.o. MRN: [id number]  Chief Complaint  Patient presents with   Hypertension    HPI: Hyperlipidemia: Patient stopped praluent 2-3 months ago due to it making her feel bad.  She is on no cholesterol medications.    Hypertension: Patient was admitted for labile hypertension early in June.  She followed up with Dr. Sirivol in early July.  Patient reports that if her systolic blood pressure is less than 120, she does not take any blood pressure medicines.  If it is above 120 she takes olmesartan  20 mg 1/2 pill in the morning.  Later during the middle of the day if she rechecks her blood pressure or she has a headache and then checks it, if it is greater than 130, she will take a second half of the olmesartan  (10 mg) and then in the evening if she checks her blood pressure and its above 130 she takes amlodipine  5 mg. Her blood pressure at home today 124/60.  This does seem to be working.  Hypothyroidism: levothyroxine  88 mcg once daily in am.  GERD: Pantoprazole  20 mg one twice daily.   CORONARY ARTERY DISEASE: Takes aspirin  81 mg daily.  No praluent.  COPD: On breztri  2 puffs twice daily and albuterol  hfa/duoneb.  Patient says Breztri  is the only thing that has helped her.  She feels shortness of breath at different times during the day, although she has been told she never sounds bad.  Per the patient it was very expensive at the pharmacy.  It is confusing as to which pharmacy she tried to get in and what exactly the issue was.  I sent a referral to our in-house pharmacist, Lang Sieve, last week.  She has not heard from them yet.  I sent a message today to try to expedite this.  PREDIABETES: Last A1C 5.9%  Patient is insistent that she has some sort of arteritis although she is not pronouncing it correctly.  She reports that her physical therapist from home told her this was causing her eyes to hurt as well as her  ears to bother her.  This has been going on since her hospitalization.  They did a thorough workup which was negative.  I find no documentation of this so-called arteritis.  The patient is going to bring her paperwork and that has this listed as a diagnosis.  She is scheduled to see her eye doctor this coming Monday and I definitely instructed her to discuss with him/her.       11/07/2022    9:54 AM 07/20/2022    2:24 PM 06/29/2022    2:13 PM 04/19/2022    1:34 PM 06/29/2021    9:28 AM  Depression screen PHQ 2/9  Decreased Interest 0 0 0 0 0  Down, Depressed, Hopeless 0 0 0 0 0  PHQ - 2 Score 0 0 0 0 0  Altered sleeping   2    Tired, decreased energy   3    Change in appetite   3    Feeling bad or failure about yourself    0    Trouble concentrating   0    Moving slowly or fidgety/restless   0    Suicidal thoughts   0    PHQ-9 Score   8    Difficult doing work/chores   Somewhat difficult          11/07/2022  9:53 AM  Fall Risk   Falls in the past year? 0  Number falls in past yr: 0  Injury with Fall? 0  Risk for fall due to : Impaired balance/gait;Impaired vision  Follow up Falls evaluation completed;Falls prevention discussed    Patient Care Team: Sherre Clapper, MD as PCP - General (Family Medicine) Croitoru, Jerel, MD as PCP - Cardiology (Cardiology) Maurilio Camellia PARAS, MD as Consulting Physician (Allergy and Immunology) Charlanne Groom, MD as Consulting Physician (Gastroenterology) Nyle Rankin POUR, [facility] (Inactive) (Pharmacist)   Review of Systems  Constitutional:  Negative for chills, fatigue and fever.  HENT:  Positive for congestion. Negative for ear pain and sore throat.   Respiratory:  Positive for shortness of breath. Negative for cough.   Cardiovascular:  Negative for chest pain and palpitations.  Gastrointestinal:  Negative for abdominal pain, constipation, diarrhea, nausea and vomiting.  Endocrine: Negative for polydipsia, polyphagia and polyuria.  Genitourinary:   Negative for difficulty urinating and dysuria.  Musculoskeletal:  Negative for arthralgias, back pain and myalgias.  Skin:  Negative for rash.  Neurological:  Positive for headaches.  Psychiatric/Behavioral:  Negative for dysphoric mood. The patient is not nervous/anxious.     Current Outpatient Medications on File Prior to Visit  Medication Sig Dispense Refill   albuterol  (VENTOLIN  HFA) 108 (90 Base) MCG/ACT inhaler Inhale 2 puffs into the lungs.     amLODipine  (NORVASC ) 5 MG tablet Take 1 tablet (5 mg total) by mouth daily. 90 tablet 1   aspirin  81 MG tablet Take 81 mg by mouth daily.     Cholecalciferol  (VITAMIN D3 SUPER STRENGTH PO) Take 1,000 Units by mouth daily after lunch.     docusate sodium  (COLACE) 50 MG capsule Take 50 mg by mouth 2 (two) times daily.     dorzolamide -timolol  (COSOPT ) 22.3-6.8 MG/ML ophthalmic solution Place 1 drop into the left eye 2 (two) times daily.      lactobacillus acidophilus (BACID) TABS tablet Take 2 tablets by mouth 3 (three) times daily.     latanoprost  (XALATAN ) 0.005 % ophthalmic solution Place 1 drop into the left eye at bedtime.     levothyroxine  (SYNTHROID ) 88 MCG tablet Take 1 tablet (88 mcg total) by mouth daily. 90 tablet 3   olmesartan  (BENICAR ) 20 MG tablet Take 1 tablet (20 mg total) by mouth daily. 90 tablet 0   pantoprazole  (PROTONIX ) 20 MG tablet Take 20 mg by mouth daily.     No current facility-administered medications on file prior to visit.   Past Medical History:  Diagnosis Date   Anxiety    Arthritis    Chest pain 2011   CARDIOLITE  - no significant symptoms, EKG changes, or arrhythmias   Congenital prolapse of bladder mucosa    Diverticulosis    Dyspnea 02/16/2012   2D ECHO - EF 55-60%, normal   Esophageal dysphagia    Fatigue    GERD (gastroesophageal reflux disease)    Glaucoma    Headache(784.0)    Heart attack (HCC)    Hiatal hernia    High blood pressure 02/16/2012   RENAL DOPPLER - normal   Hyperlipidemia     Hypothyroidism    Labile hypertension    Lightheadedness    Migraine headache    Multiple allergies    Myalgia    NSTEMI (non-ST elevated myocardial infarction) (HCC) 07/14/2020   OSA (obstructive sleep apnea)    Pain, lower leg    Calf   Problem of menstruation    Proteinuria  Reflux    SOB (shortness of breath)    Thyroid disease    Urinary problem    Valvular heart disease    Vitamin B12 deficiency    Vitamin D  deficiency    Past Surgical History:  Procedure Laterality Date   ABDOMINAL HYSTERECTOMY  1976   CHOLECYSTECTOMY  2000   COLONOSCOPY  07/27/2014   Moderate predominantly sigmoid divertuculosis. Otherwise normal colonoscopy   CORONARY ARTERY BYPASS GRAFT N/A 07/10/2018   Procedure: CORONARY ARTERY BYPASS GRAFTING (CABG), FREE LIMA;  Surgeon: Lucas Dorise POUR, MD;  Location: [facility] OR;  Service: Open Heart Surgery;  Laterality: N/A;   CYST REMOVAL NECK     CYSTECTOMY  1974   Intestine   CYSTECTOMY  1996   Brain stem   ESOPHAGOGASTRODUODENOSCOPY  12/07/2016   Schatzki's ring status post esophageal dilatation. Small hiatal hernia. Mild gastritis   EYE SURGERY  2003   EYE SURGERY  07/2016   LEFT HEART CATH AND CORONARY ANGIOGRAPHY N/A 07/08/2018   Procedure: LEFT HEART CATH AND CORONARY ANGIOGRAPHY;  Surgeon: Court Dorn PARAS, MD;  Location: [facility] INVASIVE CV LAB;  Service: Cardiovascular;  Laterality: N/A;   LEFT HEART CATH AND CORONARY ANGIOGRAPHY N/A 07/25/2019   Procedure: LEFT HEART CATH AND CORONARY ANGIOGRAPHY;  Surgeon: Verlin Lonni BIRCH, MD;  Location: [facility] INVASIVE CV LAB;  Service: Cardiovascular;  Laterality: N/A;   MOUTH SURGERY  2013   RADIOLOGY WITH ANESTHESIA N/A 11/05/2019   Procedure: MRI WITH ANESTHESIA;  Surgeon: Radiologist, Medication, MD;  Location: [facility] OR;  Service: Radiology;  Laterality: N/A;   TEE WITHOUT CARDIOVERSION N/A 07/10/2018   Procedure: TRANSESOPHAGEAL ECHOCARDIOGRAM (TEE);  Surgeon: Lucas Dorise POUR, MD;  Location: [facility] OR;  Service: Open  Heart Surgery;  Laterality: N/A;   TONSILLECTOMY     age 56    Family History  Problem Relation Age of Onset   Other Mother        blood disorder - unsure of name   Heart attack Father    Stroke Father    Cancer Sister    Cancer Brother    Asthma Brother    Cancer Brother    Social History   Socioeconomic History   Marital status: Married    Spouse name: Not on file   Number of children: 3   Years of education: college   Highest education level: Not on file  Occupational History   Occupation: Retired  Tobacco Use   Smoking status: Never   Smokeless tobacco: Never  Vaping Use   Vaping status: Never Used  Substance and Sexual Activity   Alcohol use: No   Drug use: No   Sexual activity: Not on file  Other Topics Concern   Not on file  Social History Narrative   Lives with husband in [place], [place].   Right-handed.   No daily caffeine use.   Social Drivers of Health   Financial Resource Strain: Medium Risk (10/19/2021)   Overall Financial Resource Strain (CARDIA)    Difficulty of Paying Living Expenses: Somewhat hard  Food Insecurity: No Food Insecurity (10/19/2021)   Hunger Vital Sign    Worried About Running Out of Food in the Last Year: Never true    Ran Out of Food in the Last Year: Never true  Transportation Needs: No Transportation Needs (05/09/2022)   PRAPARE - Administrator, Civil Service (Medical): No    Lack of Transportation (Non-Medical): No  Physical Activity: Inactive (05/09/2022)   Exercise Vital Sign  Days of Exercise per Week: 0 days    Minutes of Exercise per Session: 0 min  Stress: No Stress Concern Present (05/11/2021)   [employer] of Occupational Health - Occupational Stress Questionnaire    Feeling of Stress : Only a little  Social Connections: Moderately Integrated (04/19/2022)   Social Connection and Isolation Panel    Frequency of Communication with Friends and Family: Once a week    Frequency of Social Gatherings with  Friends and Family: Twice a week    Attends Religious Services: More than 4 times per year    Active Member of [employer] or Organizations: No    Attends Engineer, structural: Never    Marital Status: Married    Objective:  BP (!) 160/60   Pulse 74   Temp (!) 97.4 F (36.3 C)   Resp 16   Ht 5' 4 (1.626 m)   Wt 101 lb (45.8 kg)   SpO2 95%   BMI 17.34 kg/m      09/27/2023   10:52 AM 08/22/2023    2:55 PM 08/22/2023    2:42 PM  BP/Weight  Systolic BP 160 150 180  Diastolic BP 60 64 70  Wt. (Lbs) 101  101  BMI 17.34 kg/m2  17.34 kg/m2    Physical Exam Vitals reviewed.  Constitutional:      Appearance: Normal appearance. She is normal weight.  Neck:     Vascular: No carotid bruit.  Cardiovascular:     Rate and Rhythm: Normal rate and regular rhythm.     Heart sounds: Normal heart sounds.  Pulmonary:     Effort: Pulmonary effort is normal. No respiratory distress.     Breath sounds: Normal breath sounds.  Abdominal:     General: Abdomen is flat.     Palpations: Abdomen is soft.     Tenderness: There is no abdominal tenderness.  Neurological:     Mental Status: She is alert and oriented to person, place, and time.  Psychiatric:        Mood and Affect: Mood normal.        Behavior: Behavior normal.         Lab Results  Component Value Date   WBC 11.2 (H) 06/13/2023   HGB 12.3 06/13/2023   HCT 37.8 06/13/2023   PLT 225 06/13/2023   GLUCOSE 95 06/13/2023   CHOL 179 06/13/2023   TRIG 66 06/13/2023   HDL 69 06/13/2023   LDLCALC 97 06/13/2023   ALT 18 06/13/2023   AST 15 06/13/2023   NA 134 06/13/2023   K 4.4 06/13/2023   CL 99 06/13/2023   CREATININE 0.77 06/13/2023   BUN 11 06/13/2023   CO2 22 06/13/2023   TSH 0.618 06/13/2023   INR 0.9 07/14/2020   HGBA1C 5.9 (H) 06/13/2023      Assessment & Plan:  Gastroesophageal reflux disease with esophagitis without hemorrhage  Coronary artery disease involving native coronary artery of native heart  with unstable angina pectoris (HCC) Assessment & Plan: Continue aspirin  81 mg daily. Continue regular follow-up with cardiology.   Mixed hyperlipidemia Assessment & Plan: Await labs to determine status.   Recommend healthy diet. Check lipids today.  Orders: -     Lipid panel  Hypertensive heart disease with heart failure (HCC) Assessment & Plan: Continue pantoprazole  20 mg daily.  Orders: -     CBC with Differential/Platelet -     Comprehensive metabolic panel with GFR  Prediabetes Assessment & Plan: Check A1c.  Orders: -     Hemoglobin A1c  Simple chronic bronchitis (HCC) Assessment & Plan: Continue Breztri  2 puffs twice daily. Will send new prescription to the med center in [place] to see if we can get a prior authorization to approve. Patient to see me our pharmacist. Patient is unable to tell me if she has tried any other combined inhalers.   Other orders -     Breztri  Aerosphere; Inhale 2 puffs into the lungs in the morning and at bedtime.  Dispense: 10.7 each; Refill: 10     Meds ordered this encounter  Medications   budesonide -glycopyrrolate -formoterol  (BREZTRI  AEROSPHERE) 160-9-4.8 MCG/ACT AERO inhaler    Sig: Inhale 2 puffs into the lungs in the morning and at bedtime.    Dispense:  10.7 each    Refill:  10    Orders Placed This Encounter  Procedures   CBC with Differential/Platelet   Comprehensive metabolic panel with GFR   Hemoglobin A1c   Lipid panel     Follow-up: Return in about 3 months (around 12/28/2023) for chronic follow up.  Total time spent on today's visit was 40 minutes, including both face-to-face time and nonface-to-face time personally spent on review of chart (labs and imaging), discussing labs and goals, discussing further work-up, treatment options, referrals to specialist if needed, reviewing outside records of pertinent, answering patient's questions, and coordinating care.  I,Marla I Leal-Borjas,acting as a scribe for  Abigail Free, MD.,have documented all relevant documentation on the behalf of Abigail Free, MD,as directed by  Abigail Free, MD while in the presence of Abigail Free, MD.   An After Visit Summary was printed and given to the patient.  I attest that I have reviewed this visit and agree with the plan scribed by my staff.   Abigail Free, MD [facility] [phone]

## 2023-09-28 LAB — CBC WITH DIFFERENTIAL/PLATELET
Basophils Absolute: 0.1 x10E3/uL (ref 0.0–0.2)
Basos: 1 %
EOS (ABSOLUTE): 0.4 x10E3/uL (ref 0.0–0.4)
Eos: 5 %
Hematocrit: 40.8 % (ref 34.0–46.6)
Hemoglobin: 13.2 g/dL (ref 11.1–15.9)
Immature Grans (Abs): 0.1 x10E3/uL (ref 0.0–0.1)
Immature Granulocytes: 1 %
Lymphocytes Absolute: 1.4 x10E3/uL (ref 0.7–3.1)
Lymphs: 18 %
MCH: 32.1 pg (ref 26.6–33.0)
MCHC: 32.4 g/dL (ref 31.5–35.7)
MCV: 99 fL — ABNORMAL HIGH (ref 79–97)
Monocytes Absolute: 0.8 x10E3/uL (ref 0.1–0.9)
Monocytes: 10 %
Neutrophils Absolute: 5.1 x10E3/uL (ref 1.4–7.0)
Neutrophils: 65 %
Platelets: 275 x10E3/uL (ref 150–450)
RBC: 4.11 x10E6/uL (ref 3.77–5.28)
RDW: 14.1 % (ref 11.7–15.4)
WBC: 7.9 x10E3/uL (ref 3.4–10.8)

## 2023-09-28 LAB — COMPREHENSIVE METABOLIC PANEL WITH GFR
ALT: 19 IU/L (ref 0–32)
AST: 18 IU/L (ref 0–40)
Albumin: 4.3 g/dL (ref 3.6–4.6)
Alkaline Phosphatase: 83 IU/L (ref 44–121)
BUN/Creatinine Ratio: 24 (ref 12–28)
BUN: 16 mg/dL (ref 10–36)
Bilirubin Total: 0.4 mg/dL (ref 0.0–1.2)
CO2: 22 mmol/L (ref 20–29)
Calcium: 10.4 mg/dL — ABNORMAL HIGH (ref 8.7–10.3)
Chloride: 103 mmol/L (ref 96–106)
Creatinine, Ser: 0.68 mg/dL (ref 0.57–1.00)
Globulin, Total: 2.5 g/dL (ref 1.5–4.5)
Glucose: 98 mg/dL (ref 70–99)
Potassium: 4.8 mmol/L (ref 3.5–5.2)
Sodium: 139 mmol/L (ref 134–144)
Total Protein: 6.8 g/dL (ref 6.0–8.5)
eGFR: 83 mL/min/1.73 (ref >59)

## 2023-09-28 LAB — HEMOGLOBIN A1C
Est. average glucose Bld gHb Est-mCnc: 114 mg/dL
Hgb A1c MFr Bld: 5.6 % (ref 4.8–5.6)

## 2023-09-28 LAB — LIPID PANEL
Chol/HDL Ratio: 3.3 ratio (ref 0.0–4.4)
Cholesterol, Total: 250 mg/dL — ABNORMAL HIGH (ref 100–199)
HDL: 75 mg/dL (ref >39)
LDL Chol Calc (NIH): 159 mg/dL — ABNORMAL HIGH (ref 0–99)
Triglycerides: 95 mg/dL (ref 0–149)
VLDL Cholesterol Cal: 16 mg/dL (ref 5–40)

## 2023-09-29 ENCOUNTER — Ambulatory Visit: Payer: Self-pay | Admitting: Family Medicine

## 2023-10-01 ENCOUNTER — Telehealth: Payer: Self-pay | Admitting: Family Medicine

## 2023-10-01 NOTE — Telephone Encounter (Signed)
 [facility] Plan of Care

## 2023-10-02 NOTE — Progress Notes (Signed)
 Spoke with patient, advised that she has appt tomorrow morning and dtr Sherrie would have to handle all of the financial information. Advised that I reach out to her.

## 2023-10-02 NOTE — Progress Notes (Signed)
   10/02/2023 Name: Leah Pittman MRN: [id number] DOB: 03/31/1930   Attempted to reach dtr Julianna, per request of patient. Left message, requesting her to call me on my direct line.   I want to see if they could qualify for financial assistance on her Breztri  inhaler. I basically need to make sure that patient and husband make less than 63,450.00/yr combined and where to send the application if so.   Dtr Sherrie is on DPR and is patient's PoA.   Lang Sieve, PharmD, BCGP Clinical Pharmacist  [phone]

## 2023-10-03 ENCOUNTER — Telehealth: Payer: Self-pay

## 2023-10-03 NOTE — Progress Notes (Signed)
 Leah Pittman returned call.   Would like papers sent to her PO [address]:   [employer],  [place] [address].  If approved she would also like the medications sent to her PO Box. If PO Box shipping is not allowed, patient's address is:   [address] [place] [address]

## 2023-10-03 NOTE — Telephone Encounter (Signed)
 PAP: Patient assistance application for Breztri  through [employer] (AZ[employer]) has been mailed to pt's home address on file. Provider portion of application will be faxed to provider's office.   Provider portion of application has been faxed to Dr. Abigail Free

## 2023-10-08 ENCOUNTER — Other Ambulatory Visit (HOSPITAL_BASED_OUTPATIENT_CLINIC_OR_DEPARTMENT_OTHER): Payer: Self-pay

## 2023-10-08 NOTE — Telephone Encounter (Signed)
 Received provider portion of patient assistance application

## 2023-10-17 NOTE — Telephone Encounter (Signed)
 Spoke with patient regarding patient assistance application mailed on 8/13, pt. Has not checked her PO box, but will check today or tomorrow and let me know if she received it.

## 2023-10-20 DIAGNOSIS — T1490XA Injury, unspecified, initial encounter: Secondary | ICD-10-CM | POA: Insufficient documentation

## 2023-10-20 DIAGNOSIS — S22039A Unspecified fracture of third thoracic vertebra, initial encounter for closed fracture: Secondary | ICD-10-CM | POA: Insufficient documentation

## 2023-10-23 NOTE — Telephone Encounter (Signed)
 PAP: Application for Markus Daft has been submitted to [employer] (AZ[employer]Me), via fax

## 2023-10-26 ENCOUNTER — Telehealth: Payer: Self-pay

## 2023-10-26 NOTE — Transitions of Care (Post Inpatient/ED Visit) (Signed)
   10/26/2023  Name: Leah Pittman MRN: [id number] DOB: August 28, 1930  Today's TOC FU Call Status: Today's TOC FU Call Status:: Unsuccessful Call (1st Attempt) Unsuccessful Call (1st Attempt) Date: 10/26/23  Attempted to reach the patient regarding the most recent Inpatient/ED visit.  Follow Up Plan: Additional outreach attempts will be made to reach the patient to complete the Transitions of Care (Post Inpatient/ED visit) call.   Shona Prow RN, CCM [place]  VBCI-Population Health RN Care Manager [phone]

## 2023-10-29 ENCOUNTER — Telehealth: Payer: Self-pay

## 2023-10-29 ENCOUNTER — Other Ambulatory Visit: Payer: Self-pay | Admitting: Family Medicine

## 2023-10-29 MED ORDER — TIZANIDINE HCL 2 MG PO TABS: 2.0000 mg | ORAL_TABLET | Freq: Two times a day (BID) | ORAL | 0 refills | Status: DC | PRN

## 2023-10-29 NOTE — Transitions of Care (Post Inpatient/ED Visit) (Signed)
   10/29/2023  Name: Leah Pittman MRN: [id number] DOB: 1930/08/22  Today's TOC FU Call Status: Today's TOC FU Call Status:: Unsuccessful Call (2nd Attempt) Unsuccessful Call (2nd Attempt) Date: 10/29/23  Attempted to reach the patient regarding the most recent Inpatient/ED visit.  Follow Up Plan: Additional outreach attempts will be made to reach the patient to complete the Transitions of Care (Post Inpatient/ED visit) call.   Shona Prow RN, CCM [place]  VBCI-Population Health RN Care Manager [phone]

## 2023-10-29 NOTE — Transitions of Care (Post Inpatient/ED Visit) (Signed)
   10/29/2023  Name: Leah Pittman MRN: [id number] DOB: 10-30-1930  Today's TOC FU Call Status: Today's TOC FU Call Status:: Successful TOC FU Call Completed TOC FU Call Complete Date: 10/29/23 (Message sent to PCP: [facility]aug[facility] declined TOC but states they did not pick up new meds from the [facility] pharmacy and patient is only asking for Tizanidine  - asked if PCP could send to [facility] - suggested PCP hospital f/u be scheduled also) Patient's Name and Date of Birth confirmed.   Received Secure chat message back from Dr Sherre advising she sent the Rx for Tizanidine   Shona Prow RN, CCM [place]  VBCI-Population Health RN Care Manager [phone]

## 2023-10-31 ENCOUNTER — Ambulatory Visit: Payer: Self-pay | Admitting: Family Medicine

## 2023-10-31 NOTE — Telephone Encounter (Signed)
 PAP: Patient assistance application for Breztri  has been approved by PAP Companies: [employer] from 10/24/2023 to 02/19/2025. Medication should be delivered to PAP Delivery: Home. For further shipping updates, please contact [employer] ([employer]) at [phone]. Patient ID is: [id number]

## 2023-10-31 NOTE — Progress Notes (Signed)
 Pharmacy Medication Assistance Program Note    10/31/2023  Patient ID: Leah Pittman, female   DOB: [birth year], 89 y.o.   MRN: [id number]     10/08/2023  Outreach [employer]  Manufacturer Medication One Nurse, adult Drugs Bretztri  Type of Assistance Manufacturer Assistance  Date Application Sent to Patient 10/05/2023  Application Items Requested Application  Date Application Sent to Prescriber 10/08/2023  Name of Prescriber [name] [name]  Date Application Received From Patient 10/23/2023  Date Application Received From Provider 10/08/2023  Date Application Submitted to Manufacturer 10/23/2023  Patient Assistance Determination Approved  Approval Start Date 10/23/2023  Approval End Date 02/19/2025     Signature

## 2023-11-23 ENCOUNTER — Other Ambulatory Visit: Payer: Self-pay | Admitting: Family Medicine

## 2023-11-26 DIAGNOSIS — R42 Dizziness and giddiness: Secondary | ICD-10-CM | POA: Insufficient documentation

## 2023-11-26 DIAGNOSIS — E079 Disorder of thyroid, unspecified: Secondary | ICD-10-CM | POA: Insufficient documentation

## 2023-11-26 DIAGNOSIS — N926 Irregular menstruation, unspecified: Secondary | ICD-10-CM | POA: Insufficient documentation

## 2023-11-26 DIAGNOSIS — K449 Diaphragmatic hernia without obstruction or gangrene: Secondary | ICD-10-CM | POA: Insufficient documentation

## 2023-11-26 DIAGNOSIS — E039 Hypothyroidism, unspecified: Secondary | ICD-10-CM | POA: Insufficient documentation

## 2023-11-26 DIAGNOSIS — M1990 Unspecified osteoarthritis, unspecified site: Secondary | ICD-10-CM | POA: Insufficient documentation

## 2023-11-26 DIAGNOSIS — Z889 Allergy status to unspecified drugs, medicaments and biological substances status: Secondary | ICD-10-CM | POA: Insufficient documentation

## 2023-11-26 DIAGNOSIS — R519 Headache, unspecified: Secondary | ICD-10-CM | POA: Insufficient documentation

## 2023-11-26 DIAGNOSIS — M79669 Pain in unspecified lower leg: Secondary | ICD-10-CM | POA: Insufficient documentation

## 2023-11-26 DIAGNOSIS — G4733 Obstructive sleep apnea (adult) (pediatric): Secondary | ICD-10-CM | POA: Insufficient documentation

## 2023-11-26 DIAGNOSIS — R3989 Other symptoms and signs involving the genitourinary system: Secondary | ICD-10-CM | POA: Insufficient documentation

## 2023-11-26 DIAGNOSIS — R1319 Other dysphagia: Secondary | ICD-10-CM | POA: Insufficient documentation

## 2023-11-26 DIAGNOSIS — E538 Deficiency of other specified B group vitamins: Secondary | ICD-10-CM | POA: Insufficient documentation

## 2023-11-26 DIAGNOSIS — R809 Proteinuria, unspecified: Secondary | ICD-10-CM | POA: Insufficient documentation

## 2023-11-26 DIAGNOSIS — I219 Acute myocardial infarction, unspecified: Secondary | ICD-10-CM | POA: Insufficient documentation

## 2023-11-26 DIAGNOSIS — E785 Hyperlipidemia, unspecified: Secondary | ICD-10-CM | POA: Insufficient documentation

## 2023-11-26 DIAGNOSIS — K5790 Diverticulosis of intestine, part unspecified, without perforation or abscess without bleeding: Secondary | ICD-10-CM | POA: Insufficient documentation

## 2023-11-26 DIAGNOSIS — I38 Endocarditis, valve unspecified: Secondary | ICD-10-CM | POA: Insufficient documentation

## 2023-11-26 DIAGNOSIS — R5383 Other fatigue: Secondary | ICD-10-CM | POA: Insufficient documentation

## 2023-11-26 DIAGNOSIS — Q794 Prune belly syndrome: Secondary | ICD-10-CM | POA: Insufficient documentation
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
## 2023-11-27 ENCOUNTER — Ambulatory Visit: Attending: Cardiology | Admitting: Cardiology

## 2023-11-27 ENCOUNTER — Encounter: Payer: Self-pay | Admitting: Cardiology

## 2023-11-27 VITALS — BP 140/60 | HR 70 | Ht 63.0 in | Wt 101.8 lb

## 2023-11-27 DIAGNOSIS — Z951 Presence of aortocoronary bypass graft: Secondary | ICD-10-CM | POA: Insufficient documentation

## 2023-11-27 DIAGNOSIS — I25810 Atherosclerosis of coronary artery bypass graft(s) without angina pectoris: Secondary | ICD-10-CM | POA: Diagnosis present

## 2023-11-27 DIAGNOSIS — E782 Mixed hyperlipidemia: Secondary | ICD-10-CM | POA: Diagnosis present

## 2023-11-27 DIAGNOSIS — I10 Essential (primary) hypertension: Secondary | ICD-10-CM | POA: Diagnosis present

## 2023-11-27 MED ORDER — OLMESARTAN MEDOXOMIL 20 MG PO TABS: 20.0000 mg | ORAL_TABLET | Freq: Every day | ORAL | 3 refills | Status: AC

## 2023-11-27 MED ORDER — EZETIMIBE 10 MG PO TABS: 5.0000 mg | ORAL_TABLET | Freq: Every day | ORAL | 3 refills | Status: AC

## 2023-11-27 NOTE — Patient Instructions (Addendum)
 She is taking a long-acting calcium  channel blocker on a as needed basis for elevated blood pressure.  Medication Instructions:  Your physician has recommended you make the following change in your medication:   STOP: Amlodipine  START: Zetia 5 mg daily START: Benicar  20 mg daily (Take a second dose of Benicar  at 6 pm if evening blood pressure is greater than 170.)  *If you need a refill on your cardiac medications before your next appointment, please call your pharmacy*  Lab Work: None If you have labs (blood work) drawn today and your tests are completely normal, you will receive your results only by: MyChart Message (if you have MyChart) OR A paper copy in the mail If you have any lab test that is abnormal or we need to change your treatment, we will call you to review the results.  Testing/Procedures: None  Follow-Up: At [facility], you and your health needs are our priority.  As part of our continuing mission to provide you with exceptional heart care, our providers are all part of one team.  This team includes your primary Cardiologist (physician) and Advanced Practice Providers or APPs (Physician Assistants and Nurse Practitioners) who all work together to provide you with the care you need, when you need it.  Your next appointment:   1 year(s)  Provider:   Redell Leiter, MD    We recommend signing up for the patient portal called MyChart.  Sign up information is provided on this After Visit Summary.  MyChart is used to connect with patients for Virtual Visits (Telemedicine).  Patients are able to view lab/test results, encounter notes, upcoming appointments, etc.  Non-urgent messages can be sent to your provider as well.   To learn more about what you can do with MyChart, go to [email or web address].   Other Instructions Use good blood pressure technique.      1. Avoid all over-the-counter antihistamines except Claritin/Loratadine and Zyrtec/Cetrizine. 2.  Avoid all combination including cold sinus allergies flu decongestant and sleep medications 3. You can use Robitussin DM Mucinex  and Mucinex  DM for cough. 4. can use Tylenol  aspirin  ibuprofen and naproxen but no combinations such as sleep or sinus.
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
## 2024-01-08 NOTE — Assessment & Plan Note (Addendum)
 Labile blood pressure with fluctuations from 99/57 to 206/?. Current olmesartan  inconsistent regimen leads to fluctuations and concerns about hypotension. - Take olmesartan  10 mg twice daily, adjust based on blood pressure. - If blood pressure exceeds 110 at lunchtime, take full dose. - If high at night, take half dose. - Monitor blood pressure regularly and adjust medication. Orders:   CBC with Differential/Platelet   Comprehensive metabolic panel with GFR

## 2024-01-08 NOTE — Assessment & Plan Note (Addendum)
 Currently on Zetia  5 mg, taking half a tablet. Tolerating well. - Continue Zetia  5 mg, taking half a tablet daily. Orders:   POCT Lipid Panel

## 2024-01-09 ENCOUNTER — Encounter: Payer: Self-pay | Admitting: Family Medicine

## 2024-01-09 ENCOUNTER — Ambulatory Visit: Admitting: Family Medicine

## 2024-01-09 VITALS — BP 136/64 | HR 62 | Temp 98.3°F | Ht 63.0 in | Wt 101.0 lb

## 2024-01-09 DIAGNOSIS — K219 Gastro-esophageal reflux disease without esophagitis: Secondary | ICD-10-CM | POA: Diagnosis not present

## 2024-01-09 DIAGNOSIS — S2249XA Multiple fractures of ribs, unspecified side, initial encounter for closed fracture: Secondary | ICD-10-CM

## 2024-01-09 DIAGNOSIS — E039 Hypothyroidism, unspecified: Secondary | ICD-10-CM

## 2024-01-09 DIAGNOSIS — J4540 Moderate persistent asthma, uncomplicated: Secondary | ICD-10-CM | POA: Diagnosis not present

## 2024-01-09 DIAGNOSIS — I119 Hypertensive heart disease without heart failure: Secondary | ICD-10-CM

## 2024-01-09 DIAGNOSIS — G603 Idiopathic progressive neuropathy: Secondary | ICD-10-CM

## 2024-01-09 DIAGNOSIS — E782 Mixed hyperlipidemia: Secondary | ICD-10-CM

## 2024-01-09 MED ORDER — FAMOTIDINE 40 MG PO TABS
40.0000 mg | ORAL_TABLET | Freq: Every day | ORAL | 2 refills | Status: AC
Start: 2024-01-09 — End: ?

## 2024-01-09 MED ORDER — MONTELUKAST SODIUM 10 MG PO TABS: 10.0000 mg | ORAL_TABLET | Freq: Every day | ORAL | 1 refills | Status: DC
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
## 2024-01-10 ENCOUNTER — Ambulatory Visit: Payer: Self-pay | Admitting: Family Medicine

## 2024-01-10 LAB — CBC WITH DIFFERENTIAL/PLATELET
Basophils Absolute: 0.1 x10E3/uL (ref 0.0–0.2)
Basos: 1 %
EOS (ABSOLUTE): 0.3 x10E3/uL (ref 0.0–0.4)
Eos: 4 %
Hematocrit: 39.3 % (ref 34.0–46.6)
Hemoglobin: 13.2 g/dL (ref 11.1–15.9)
Immature Grans (Abs): 0 x10E3/uL (ref 0.0–0.1)
Immature Granulocytes: 0 %
Lymphocytes Absolute: 1.6 x10E3/uL (ref 0.7–3.1)
Lymphs: 22 %
MCH: 32.1 pg (ref 26.6–33.0)
MCHC: 33.6 g/dL (ref 31.5–35.7)
MCV: 96 fL (ref 79–97)
Monocytes Absolute: 0.8 x10E3/uL (ref 0.1–0.9)
Monocytes: 11 %
Neutrophils Absolute: 4.3 x10E3/uL (ref 1.4–7.0)
Neutrophils: 62 %
Platelets: 253 x10E3/uL (ref 150–450)
RBC: 4.11 x10E6/uL (ref 3.77–5.28)
RDW: 13.7 % (ref 11.7–15.4)
WBC: 7 x10E3/uL (ref 3.4–10.8)

## 2024-01-10 LAB — COMPREHENSIVE METABOLIC PANEL WITH GFR
ALT: 22 IU/L (ref 0–32)
AST: 21 IU/L (ref 0–40)
Albumin: 4.4 g/dL (ref 3.6–4.6)
Alkaline Phosphatase: 78 IU/L (ref 48–129)
BUN/Creatinine Ratio: 17 (ref 12–28)
BUN: 13 mg/dL (ref 10–36)
Bilirubin Total: 0.4 mg/dL (ref 0.0–1.2)
CO2: 23 mmol/L (ref 20–29)
Calcium: 10.4 mg/dL — ABNORMAL HIGH (ref 8.7–10.3)
Chloride: 103 mmol/L (ref 96–106)
Creatinine, Ser: 0.78 mg/dL (ref 0.57–1.00)
Globulin, Total: 2.3 g/dL (ref 1.5–4.5)
Glucose: 102 mg/dL — ABNORMAL HIGH (ref 70–99)
Potassium: 4.9 mmol/L (ref 3.5–5.2)
Sodium: 137 mmol/L (ref 134–144)
Total Protein: 6.7 g/dL (ref 6.0–8.5)
eGFR: 72 mL/min/1.73 (ref >59)

## 2024-01-10 LAB — TSH: TSH: 4.43 u[IU]/mL (ref 0.450–4.500)

## 2024-01-10 LAB — T4, FREE: Free T4: 1.54 ng/dL (ref 0.82–1.77)

## 2024-01-10 NOTE — Telephone Encounter (Signed)
 Called and spoke to patient per your request, states that at noon her bp was 123/47, she has not taken any bp medications today. Denies any dizziness or fatigue. She has drank green tea, 2 bottles of water and a glass of milk, she is feeling much better.

## 2024-01-12 NOTE — Assessment & Plan Note (Addendum)
 Current levothyroxine  formulation causes discomfort. Previous thyroid levels normal. Concerns about efficacy of current formulation. - Checked thyroid levels. - Consider switching to brand name levothyroxine  if levels abnormal and cost reasonable. Orders:   T4, free   TSH

## 2024-01-12 NOTE — Assessment & Plan Note (Addendum)
 GERD symptoms include throat irritation and discomfort with sour foods. Pantoprazole  provides some relief. Acid reflux may contribute to asthma symptoms. - Continue pantoprazole . - Prescribed additional medication for acid reflux. Start famotidine . Orders:   famotidine  (PEPCID ) 40 MG tablet; Take 1 tablet (40 mg total) by mouth daily.

## 2024-01-30 ENCOUNTER — Ambulatory Visit: Payer: Self-pay | Admitting: *Deleted

## 2024-01-30 NOTE — Telephone Encounter (Signed)
 FYI Only or Action Required?: Action required by provider: lab or test result follow-up needed.  Patient was last seen in primary care on 01/09/2024 by Sherre Clapper, MD.  Called Nurse Triage reporting Results.  Symptoms began na  Interventions attempted: Other: na.  Symptoms are: na.  Triage Disposition: Call PCP When Office is Open  Patient/caregiver understands and will follow disposition?: Yes  Copied from CRM #[id number]. Topic: Clinical - Lab/Test Results >> Jan 30, 2024 11:10 AM Rosaria BRAVO wrote: Reason for CRM: Question about cholesterol Reason for Disposition  [1] Caller requesting NON-URGENT health information AND [2] PCP's office is the best resource  Answer Assessment - Initial Assessment Questions 1. REASON FOR CALL: What is the main reason for your call? or How can I best help you?     Patient is calling to request to have her cholesterol checked. Patient states she discussed at her last OV and provider was going to order. Patient states when she was called with results- the lab had not been drawn/ordered and follow up was promised. Patient reports she has not heard back and would like to see if her provider will order this lab.   Patient advised I would send message to her provider and she should hear back from office regarding her request within 24 hours.  Protocols used: Information Only Call - No Triage-A-AH

## 2024-02-01 ENCOUNTER — Other Ambulatory Visit: Payer: Self-pay | Admitting: Family Medicine

## 2024-02-01 DIAGNOSIS — J4540 Moderate persistent asthma, uncomplicated: Secondary | ICD-10-CM

## 2024-02-26 ENCOUNTER — Ambulatory Visit: Admitting: Family Medicine

## 2024-03-05 ENCOUNTER — Telehealth: Payer: Self-pay

## 2024-03-05 NOTE — Telephone Encounter (Signed)
 Spoke with patient, she states she will give them a call and let us  know what they tell her. Patient states they have not tried calling her at all.   Copied from CRM [id number]. Topic: Clinical - Lab/Test Results >> Mar 05, 2024  2:33 PM Delon DASEN wrote: Reason for CRM: [facility] with [facility]- patient had a CT scan in August, had a lung nodule. Patient needs follow up treatment for the lung nodule. Please follow up with patient about another CT scan- Please call [place] [phone] to inform what patient wants to do.

## 2024-03-15 ENCOUNTER — Other Ambulatory Visit: Payer: Self-pay | Admitting: Family Medicine

## 2024-03-21 ENCOUNTER — Ambulatory Visit: Payer: Self-pay

## 2024-03-27 ENCOUNTER — Telehealth: Payer: Self-pay

## 2024-03-27 ENCOUNTER — Other Ambulatory Visit: Payer: Self-pay

## 2024-03-27 DIAGNOSIS — R911 Solitary pulmonary nodule: Secondary | ICD-10-CM

## 2024-03-27 DIAGNOSIS — R918 Other nonspecific abnormal finding of lung field: Secondary | ICD-10-CM

## 2024-03-27 NOTE — Telephone Encounter (Signed)
 Talked with Leah Pittman and explained that CT scan has to be done in 6 months from August. Patient's daughter requested to be done in the 3rd week of March (16-22th).  I explained her the plan of care will depend of the results of the CT.  I put the order in and put a comment.  Copied from CRM [id number]. Topic: Referral - Status >> Mar 27, 2024 12:11 PM Leah Pittman wrote: Reason for CRM: Pt's daughter Leah Pittman called in to check when the CT scan needs to be completed. I advised after 37m from the last. She would also like addtl information regarding tests and the plan of care. Please call daughgter on #[phone]

## 2024-04-07 ENCOUNTER — Ambulatory Visit: Admitting: Family Medicine
